# Patient Record
Sex: Female | Born: 1935 | Race: Black or African American | Hispanic: No | Marital: Single | Smoking: Never smoker
Health system: Southern US, Community
[De-identification: ages and names within clinical notes are randomized; demographics above are authoritative.]

## PROBLEM LIST (undated history)

## (undated) ENCOUNTER — Emergency Department (HOSPITAL_COMMUNITY): Admission: EM | Discharge status: Absent without leave | Payer: Medicare Other

## (undated) ENCOUNTER — Emergency Department (HOSPITAL_COMMUNITY): Admission: EM | Payer: Medicare Other | Source: Home / Self Care

## (undated) DIAGNOSIS — E119 Type 2 diabetes mellitus without complications: Secondary | ICD-10-CM

## (undated) DIAGNOSIS — Y92009 Unspecified place in unspecified non-institutional (private) residence as the place of occurrence of the external cause: Secondary | ICD-10-CM

## (undated) DIAGNOSIS — N390 Urinary tract infection, site not specified: Secondary | ICD-10-CM

## (undated) DIAGNOSIS — N182 Chronic kidney disease, stage 2 (mild): Secondary | ICD-10-CM

## (undated) DIAGNOSIS — I5042 Chronic combined systolic (congestive) and diastolic (congestive) heart failure: Secondary | ICD-10-CM

## (undated) DIAGNOSIS — E785 Hyperlipidemia, unspecified: Secondary | ICD-10-CM

## (undated) DIAGNOSIS — M1990 Unspecified osteoarthritis, unspecified site: Secondary | ICD-10-CM

## (undated) DIAGNOSIS — I340 Nonrheumatic mitral (valve) insufficiency: Secondary | ICD-10-CM

## (undated) DIAGNOSIS — N200 Calculus of kidney: Secondary | ICD-10-CM

## (undated) DIAGNOSIS — I10 Essential (primary) hypertension: Secondary | ICD-10-CM

## (undated) DIAGNOSIS — I499 Cardiac arrhythmia, unspecified: Secondary | ICD-10-CM

## (undated) DIAGNOSIS — IMO0001 Reserved for inherently not codable concepts without codable children: Secondary | ICD-10-CM

## (undated) DIAGNOSIS — Z9289 Personal history of other medical treatment: Secondary | ICD-10-CM

## (undated) DIAGNOSIS — K5790 Diverticulosis of intestine, part unspecified, without perforation or abscess without bleeding: Secondary | ICD-10-CM

## (undated) DIAGNOSIS — K5909 Other constipation: Secondary | ICD-10-CM

## (undated) DIAGNOSIS — I4891 Unspecified atrial fibrillation: Secondary | ICD-10-CM

## (undated) DIAGNOSIS — K649 Unspecified hemorrhoids: Secondary | ICD-10-CM

## (undated) DIAGNOSIS — K922 Gastrointestinal hemorrhage, unspecified: Secondary | ICD-10-CM

## (undated) DIAGNOSIS — K219 Gastro-esophageal reflux disease without esophagitis: Secondary | ICD-10-CM

## (undated) DIAGNOSIS — J189 Pneumonia, unspecified organism: Secondary | ICD-10-CM

## (undated) DIAGNOSIS — E669 Obesity, unspecified: Secondary | ICD-10-CM

## (undated) DIAGNOSIS — Z8719 Personal history of other diseases of the digestive system: Secondary | ICD-10-CM

## (undated) DIAGNOSIS — D509 Iron deficiency anemia, unspecified: Secondary | ICD-10-CM

## (undated) DIAGNOSIS — N8110 Cystocele, unspecified: Secondary | ICD-10-CM

## (undated) DIAGNOSIS — W19XXXA Unspecified fall, initial encounter: Secondary | ICD-10-CM

## (undated) DIAGNOSIS — S82899A Other fracture of unspecified lower leg, initial encounter for closed fracture: Secondary | ICD-10-CM

## (undated) HISTORY — DX: Unspecified atrial fibrillation: I48.91

## (undated) HISTORY — DX: Type 2 diabetes mellitus without complications: E11.9

## (undated) HISTORY — DX: Unspecified place in unspecified non-institutional (private) residence as the place of occurrence of the external cause: Y92.009

## (undated) HISTORY — DX: Essential (primary) hypertension: I10

## (undated) HISTORY — DX: Unspecified hemorrhoids: K64.9

## (undated) HISTORY — DX: Diverticulosis of intestine, part unspecified, without perforation or abscess without bleeding: K57.90

## (undated) HISTORY — PX: OTHER SURGICAL HISTORY: SHX169

## (undated) HISTORY — PX: FRACTURE SURGERY: SHX138

## (undated) HISTORY — DX: Unspecified osteoarthritis, unspecified site: M19.90

## (undated) HISTORY — DX: Other fracture of unspecified lower leg, initial encounter for closed fracture: S82.899A

## (undated) HISTORY — PX: VAGINAL HYSTERECTOMY: SUR661

## (undated) HISTORY — DX: Unspecified fall, initial encounter: W19.XXXA

## (undated) HISTORY — DX: Hyperlipidemia, unspecified: E78.5

---

## 1998-10-10 HISTORY — PX: WRIST FRACTURE SURGERY: SHX121

## 1999-06-22 ENCOUNTER — Encounter: Payer: Self-pay | Admitting: Emergency Medicine

## 1999-06-22 ENCOUNTER — Emergency Department (HOSPITAL_COMMUNITY): Admission: EM | Admit: 1999-06-22 | Discharge: 1999-06-22 | Payer: Self-pay | Admitting: Emergency Medicine

## 1999-06-26 ENCOUNTER — Emergency Department (HOSPITAL_COMMUNITY): Admission: EM | Admit: 1999-06-26 | Discharge: 1999-06-26 | Payer: Self-pay | Admitting: Emergency Medicine

## 1999-07-23 ENCOUNTER — Emergency Department (HOSPITAL_COMMUNITY): Admission: EM | Admit: 1999-07-23 | Discharge: 1999-07-23 | Payer: Self-pay | Admitting: Emergency Medicine

## 1999-07-23 ENCOUNTER — Encounter: Payer: Self-pay | Admitting: Emergency Medicine

## 2001-06-23 ENCOUNTER — Encounter: Admission: RE | Admit: 2001-06-23 | Discharge: 2001-06-23 | Payer: Self-pay | Admitting: Family Medicine

## 2001-06-23 ENCOUNTER — Encounter: Payer: Self-pay | Admitting: Family Medicine

## 2002-06-26 ENCOUNTER — Encounter: Payer: Self-pay | Admitting: Family Medicine

## 2002-06-26 ENCOUNTER — Encounter: Admission: RE | Admit: 2002-06-26 | Discharge: 2002-06-26 | Payer: Self-pay | Admitting: Family Medicine

## 2004-01-12 ENCOUNTER — Emergency Department (HOSPITAL_COMMUNITY): Admission: EM | Admit: 2004-01-12 | Discharge: 2004-01-12 | Payer: Self-pay | Admitting: Emergency Medicine

## 2004-05-20 ENCOUNTER — Emergency Department (HOSPITAL_COMMUNITY): Admission: EM | Admit: 2004-05-20 | Discharge: 2004-05-20 | Payer: Self-pay | Admitting: Emergency Medicine

## 2004-05-25 ENCOUNTER — Emergency Department (HOSPITAL_COMMUNITY): Admission: EM | Admit: 2004-05-25 | Discharge: 2004-05-26 | Payer: Self-pay | Admitting: Emergency Medicine

## 2004-11-27 ENCOUNTER — Emergency Department (HOSPITAL_COMMUNITY): Admission: EM | Admit: 2004-11-27 | Discharge: 2004-11-28 | Payer: Self-pay | Admitting: Emergency Medicine

## 2004-12-21 ENCOUNTER — Emergency Department (HOSPITAL_COMMUNITY): Admission: EM | Admit: 2004-12-21 | Discharge: 2004-12-21 | Payer: Self-pay | Admitting: Emergency Medicine

## 2006-02-23 ENCOUNTER — Emergency Department (HOSPITAL_COMMUNITY): Admission: EM | Admit: 2006-02-23 | Discharge: 2006-02-23 | Payer: Self-pay | Admitting: Family Medicine

## 2006-04-26 ENCOUNTER — Other Ambulatory Visit: Admission: RE | Admit: 2006-04-26 | Discharge: 2006-04-26 | Payer: Self-pay | Admitting: Obstetrics and Gynecology

## 2006-10-14 ENCOUNTER — Encounter: Admission: RE | Admit: 2006-10-14 | Discharge: 2006-10-14 | Payer: Self-pay | Admitting: Family Medicine

## 2006-12-22 ENCOUNTER — Encounter (INDEPENDENT_AMBULATORY_CARE_PROVIDER_SITE_OTHER): Payer: Self-pay | Admitting: Obstetrics and Gynecology

## 2006-12-22 ENCOUNTER — Inpatient Hospital Stay (HOSPITAL_COMMUNITY): Admission: RE | Admit: 2006-12-22 | Discharge: 2006-12-24 | Payer: Self-pay | Admitting: Obstetrics and Gynecology

## 2006-12-23 ENCOUNTER — Ambulatory Visit: Payer: Self-pay | Admitting: Vascular Surgery

## 2006-12-23 ENCOUNTER — Encounter (INDEPENDENT_AMBULATORY_CARE_PROVIDER_SITE_OTHER): Payer: Self-pay | Admitting: Obstetrics and Gynecology

## 2007-07-04 ENCOUNTER — Emergency Department (HOSPITAL_COMMUNITY): Admission: EM | Admit: 2007-07-04 | Discharge: 2007-07-04 | Payer: Self-pay | Admitting: Family Medicine

## 2007-12-25 ENCOUNTER — Encounter: Admission: RE | Admit: 2007-12-25 | Discharge: 2007-12-25 | Payer: Self-pay | Admitting: Family Medicine

## 2008-01-22 ENCOUNTER — Emergency Department (HOSPITAL_COMMUNITY): Admission: EM | Admit: 2008-01-22 | Discharge: 2008-01-22 | Payer: Self-pay | Admitting: Emergency Medicine

## 2008-09-12 ENCOUNTER — Emergency Department (HOSPITAL_COMMUNITY): Admission: EM | Admit: 2008-09-12 | Discharge: 2008-09-12 | Payer: Self-pay | Admitting: Emergency Medicine

## 2008-10-14 ENCOUNTER — Emergency Department (HOSPITAL_COMMUNITY): Admission: EM | Admit: 2008-10-14 | Discharge: 2008-10-15 | Payer: Self-pay | Admitting: Emergency Medicine

## 2009-01-20 ENCOUNTER — Inpatient Hospital Stay (HOSPITAL_COMMUNITY): Admission: EM | Admit: 2009-01-20 | Discharge: 2009-01-27 | Payer: Self-pay | Admitting: Emergency Medicine

## 2009-01-21 ENCOUNTER — Encounter (INDEPENDENT_AMBULATORY_CARE_PROVIDER_SITE_OTHER): Payer: Self-pay | Admitting: Internal Medicine

## 2009-01-23 ENCOUNTER — Encounter (INDEPENDENT_AMBULATORY_CARE_PROVIDER_SITE_OTHER): Payer: Self-pay | Admitting: Internal Medicine

## 2009-01-23 ENCOUNTER — Ambulatory Visit: Payer: Self-pay | Admitting: Vascular Surgery

## 2009-02-20 DIAGNOSIS — K5909 Other constipation: Secondary | ICD-10-CM | POA: Insufficient documentation

## 2009-02-20 DIAGNOSIS — I110 Hypertensive heart disease with heart failure: Secondary | ICD-10-CM

## 2009-02-20 DIAGNOSIS — Z8719 Personal history of other diseases of the digestive system: Secondary | ICD-10-CM

## 2009-03-04 ENCOUNTER — Ambulatory Visit: Payer: Self-pay | Admitting: Cardiology

## 2009-03-04 ENCOUNTER — Ambulatory Visit: Payer: Self-pay | Admitting: Internal Medicine

## 2009-03-04 ENCOUNTER — Inpatient Hospital Stay (HOSPITAL_COMMUNITY): Admission: EM | Admit: 2009-03-04 | Discharge: 2009-03-19 | Payer: Self-pay | Admitting: Emergency Medicine

## 2009-03-04 DIAGNOSIS — R609 Edema, unspecified: Secondary | ICD-10-CM

## 2009-03-04 DIAGNOSIS — I428 Other cardiomyopathies: Secondary | ICD-10-CM | POA: Insufficient documentation

## 2009-03-05 ENCOUNTER — Ambulatory Visit: Payer: Self-pay | Admitting: Cardiovascular Disease

## 2009-03-05 ENCOUNTER — Encounter: Payer: Self-pay | Admitting: Cardiology

## 2009-03-09 ENCOUNTER — Encounter: Payer: Self-pay | Admitting: Internal Medicine

## 2009-03-25 ENCOUNTER — Telehealth: Payer: Self-pay | Admitting: Cardiology

## 2009-04-15 ENCOUNTER — Ambulatory Visit: Payer: Self-pay | Admitting: Cardiology

## 2009-04-16 ENCOUNTER — Encounter: Payer: Self-pay | Admitting: Cardiology

## 2009-04-18 ENCOUNTER — Encounter: Payer: Self-pay | Admitting: Cardiology

## 2009-05-02 ENCOUNTER — Encounter: Payer: Self-pay | Admitting: Cardiology

## 2009-05-20 ENCOUNTER — Encounter: Payer: Self-pay | Admitting: Cardiology

## 2009-06-06 ENCOUNTER — Encounter: Payer: Self-pay | Admitting: Cardiology

## 2009-07-08 ENCOUNTER — Encounter: Payer: Self-pay | Admitting: Cardiology

## 2009-07-24 ENCOUNTER — Encounter: Payer: Self-pay | Admitting: Cardiology

## 2009-08-05 ENCOUNTER — Telehealth: Payer: Self-pay | Admitting: Cardiology

## 2009-08-15 ENCOUNTER — Telehealth: Payer: Self-pay | Admitting: Cardiology

## 2009-08-23 ENCOUNTER — Ambulatory Visit: Payer: Self-pay | Admitting: Internal Medicine

## 2009-08-23 ENCOUNTER — Inpatient Hospital Stay (HOSPITAL_COMMUNITY): Admission: EM | Admit: 2009-08-23 | Discharge: 2009-08-27 | Payer: Self-pay | Admitting: Emergency Medicine

## 2009-08-25 ENCOUNTER — Encounter (INDEPENDENT_AMBULATORY_CARE_PROVIDER_SITE_OTHER): Payer: Self-pay | Admitting: *Deleted

## 2009-08-27 ENCOUNTER — Encounter (INDEPENDENT_AMBULATORY_CARE_PROVIDER_SITE_OTHER): Payer: Self-pay | Admitting: *Deleted

## 2009-08-29 ENCOUNTER — Ambulatory Visit: Payer: Self-pay | Admitting: Cardiology

## 2009-09-01 LAB — CONVERTED CEMR LAB
Calcium: 9.7 mg/dL (ref 8.4–10.5)
Chloride: 96 meq/L (ref 96–112)
Creatinine, Ser: 1.6 mg/dL — ABNORMAL HIGH (ref 0.4–1.2)

## 2009-09-09 ENCOUNTER — Ambulatory Visit: Payer: Self-pay | Admitting: Cardiology

## 2009-09-09 ENCOUNTER — Encounter: Payer: Self-pay | Admitting: Physician Assistant

## 2009-09-09 DIAGNOSIS — I5042 Chronic combined systolic (congestive) and diastolic (congestive) heart failure: Secondary | ICD-10-CM

## 2009-09-09 LAB — CONVERTED CEMR LAB: Potassium: 3.6 meq/L (ref 3.5–5.1)

## 2009-09-17 ENCOUNTER — Ambulatory Visit: Payer: Self-pay | Admitting: Cardiology

## 2009-09-18 LAB — CONVERTED CEMR LAB
BUN: 29 mg/dL — ABNORMAL HIGH (ref 6–23)
Chloride: 88 meq/L — ABNORMAL LOW (ref 96–112)
GFR calc non Af Amer: 63.26 mL/min (ref >60)
Glucose, Bld: 114 mg/dL — ABNORMAL HIGH (ref 70–99)
Potassium: 3.1 meq/L — ABNORMAL LOW (ref 3.5–5.1)
Sodium: 129 meq/L — ABNORMAL LOW (ref 135–145)

## 2009-09-24 ENCOUNTER — Ambulatory Visit: Payer: Self-pay | Admitting: Cardiology

## 2009-09-24 DIAGNOSIS — E876 Hypokalemia: Secondary | ICD-10-CM | POA: Insufficient documentation

## 2009-09-25 LAB — CONVERTED CEMR LAB
BUN: 31 mg/dL — ABNORMAL HIGH (ref 6–23)
Calcium: 9.4 mg/dL (ref 8.4–10.5)
Chloride: 92 meq/L — ABNORMAL LOW (ref 96–112)
Creatinine, Ser: 1.1 mg/dL (ref 0.4–1.2)
GFR calc non Af Amer: 61.95 mL/min (ref >60)
Glucose, Bld: 70 mg/dL (ref 70–99)

## 2009-10-03 ENCOUNTER — Encounter: Payer: Self-pay | Admitting: Cardiology

## 2009-10-09 ENCOUNTER — Encounter: Payer: Self-pay | Admitting: Cardiology

## 2009-10-20 ENCOUNTER — Ambulatory Visit: Payer: Self-pay | Admitting: Gastroenterology

## 2009-10-24 ENCOUNTER — Ambulatory Visit: Payer: Self-pay | Admitting: Cardiology

## 2009-10-30 ENCOUNTER — Telehealth: Payer: Self-pay | Admitting: Cardiology

## 2009-10-30 LAB — CONVERTED CEMR LAB
BUN: 23 mg/dL (ref 6–23)
Chloride: 90 meq/L — ABNORMAL LOW (ref 96–112)
Creatinine, Ser: 1.2 mg/dL (ref 0.4–1.2)
Glucose, Bld: 66 mg/dL — ABNORMAL LOW (ref 70–99)
Potassium: 4.8 meq/L (ref 3.5–5.1)
Sodium: 126 meq/L — ABNORMAL LOW (ref 135–145)

## 2009-10-31 ENCOUNTER — Ambulatory Visit: Payer: Self-pay | Admitting: Cardiology

## 2009-12-10 ENCOUNTER — Encounter: Payer: Self-pay | Admitting: Cardiology

## 2010-02-10 LAB — CONVERTED CEMR LAB
BUN: 29 mg/dL — ABNORMAL HIGH (ref 6–23)
Calcium: 9.3 mg/dL (ref 8.4–10.5)
Creatinine, Ser: 1.2 mg/dL (ref 0.4–1.2)
GFR calc non Af Amer: 57.71 mL/min (ref >60)
Potassium: 3.5 meq/L (ref 3.5–5.1)

## 2010-02-16 ENCOUNTER — Encounter: Payer: Self-pay | Admitting: Cardiology

## 2010-03-08 LAB — CONVERTED CEMR LAB
BUN: 21 mg/dL (ref 6–23)
Calcium: 9.1 mg/dL (ref 8.4–10.5)
Chloride: 103 meq/L (ref 96–112)
Glucose, Bld: 82 mg/dL (ref 70–99)
Potassium: 4.4 meq/L (ref 3.5–5.1)
Sodium: 137 meq/L (ref 135–145)

## 2010-03-10 NOTE — Assessment & Plan Note (Signed)
Summary: eph. appt is 11:30/ gd   Visit Type:  post-hospital Primary Provider:  Dr. Margretta Ditty, MD  CC:  Sob.  History of Present Illness: This is a 76 year old African American female patient who has chronic systolic and diastolic heart failure and was admitted to the hospital with a recent exacerbation. She also had abdominal discomfort felt secondary to chronic constipation. The patient did have some renal insufficiency with a creatinine of 1.5 on day of discharge.  The patient says her breathing is better. Her main complaint is having a prolapsed bladder. She does not urinate during the day much but when she lays down at nigh,t her bladder just leaks all night long, profusely. She has had multiple UTIs and has a follow up appointment with her urologist on Thursday. She thinks she is on too much Lasix because of how much urine she leaks at night.  Current Medications (verified): 1)  Coreg 3.125 Mg Tabs (Carvedilol) .Marland Kitchen.. 1 Tab Two Times A Day 2)  Lisinopril 10 Mg Tabs (Lisinopril) .... Once Daily 3)  Polyethylene Glycol 1450  Liqd (Polyethylene Glycol 1450) .Marland Kitchen.. 17gms Every Two Days 4)  Aspirin 81 Mg Tbec (Aspirin) .... Take One Tablet By Mouth Daily 5)  Ferretts 325 (106 Fe) Mg Tabs (Ferrous Fumarate) .... One Two Times A Day 6)  Loratadine-Pseudoephedrine 10-240 Mg Xr24h-Tab (Loratadine-Pseudoephedrine) .... Onr Tab  As Needed  For Runny Nose 7)  Furosemide 80 Mg Tabs (Furosemide) .Marland Kitchen.. 1 By Mouth Two Times A Day 8)  Klor-Con 10 10 Meq Cr-Tabs (Potassium Chloride) .Marland Kitchen.. 1 By Mouth Three Times A Day 9)  Zaroxolyn 2.5 Mg Tabs (Metolazone) .... 30 Minutes Prior To Lasix 2 Times Weekly 10)  Premarin 0.625 Mg/gm Crea (Estrogens, Conjugated) .... Weekly 11)  Bisacodyl Ec 5 Mg Tbec (Bisacodyl) .... Take 1 Tablet By Mouth Once A Day 12)  Tramadol Hcl 50 Mg Tabs (Tramadol Hcl) .... As Needed  Allergies: 1)  ! Pcn 2)  ! Diphtheria-Tetanus Toxoids (Diphtheria-Tetanus Toxoids Dt) 3)  ! *  Latex  Past History:  Past Medical History: Last updated: 03/04/2009  1. Hypertension. x years  2. She has had multiple prior visits to the emergency room with       various complaints, many musculoskeletal and a few abdominal       complaints.    3. History of diverticulosis.   4. Hemorrhoids.   5. Chronic constipation.   6. Osteoarthritis.   7. Cardiomyopathy (EF 40 - 45%)  Review of Systems       see history of present illness. She is on home oxygen.  Vital Signs:  Patient profile:   76 year old female Height:      61 inches Weight:      199 pounds BMI:     37.74 Pulse rate:   82 / minute Pulse rhythm:   irregular Resp:     20 per minute BP sitting:   90 / 60  (left arm) Cuff size:   large  Vitals Entered By: Vikki Ports (September 09, 2009 11:43 AM)  Physical Exam  General:   Well-nournished, in no acute distress. Neck: No JVD, HJR, Bruit, or thyroid enlargement Lungs: No tachypnea, clear without wheezing, rales, or rhonchi Cardiovascular: RRR, PMI not displaced, heart sounds normal, no murmurs, gallops, bruit, thrill, or heave. Abdomen: BS normal. Soft without organomegaly, masses, lesions or tenderness. Extremities: trace of edema bilaterally in the lower extremities, without cyanosis, clubbing .Good distal pulses bilateral SKin: Warm, no  lesions or rashes  Musculoskeletal: No deformities Neuro: no focal signs    EKG  Procedure date:  09/09/2009  Findings:      normal sinus rhythm with PVCs nonspecific ST-T wave changes  Impression & Recommendations:  Problem # 1:  COMBINED HEART FAILURE, CHRONIC (ICD-428.42) Patient had a recent admission with acute on chronic systolic and diastolic heart failure. She did diurese in the hospital. She had some renal insufficiency with creatinine up to 1.5 and a of discharge. Her main complaints her related to her bladder prolapse and leaking of urine when she lays down. We will check a creatinine today. If it's elevated  we will decrease her diuretics. She is well compensated today but I am afraid to back off on her diuretics at this point unless her creatinine is elevated. Her updated medication list for this problem includes:    Coreg 3.125 Mg Tabs (Carvedilol) .Marland Kitchen... 1 tab two times a day    Lisinopril 10 Mg Tabs (Lisinopril) ..... Once daily    Aspirin 81 Mg Tbec (Aspirin) .Marland Kitchen... Take one tablet by mouth daily    Furosemide 80 Mg Tabs (Furosemide) .Marland Kitchen... 1 by mouth two times a day    Zaroxolyn 2.5 Mg Tabs (Metolazone) .Marland KitchenMarland KitchenMarland KitchenMarland Kitchen 30 minutes prior to lasix 2 times weekly  Problem # 2:  HYPERTENSION (ICD-401.9) The pressure is stable Her updated medication list for this problem includes:    Coreg 3.125 Mg Tabs (Carvedilol) .Marland Kitchen... 1 tab two times a day    Lisinopril 10 Mg Tabs (Lisinopril) ..... Once daily    Aspirin 81 Mg Tbec (Aspirin) .Marland Kitchen... Take one tablet by mouth daily    Furosemide 80 Mg Tabs (Furosemide) .Marland Kitchen... 1 by mouth two times a day    Zaroxolyn 2.5 Mg Tabs (Metolazone) .Marland KitchenMarland KitchenMarland KitchenMarland Kitchen 30 minutes prior to lasix 2 times weekly  Orders: TLB-BMP (Basic Metabolic Panel-BMET) (80048-METABOL)  Other Orders: EKG w/ Interpretation (93000)  Patient Instructions: 1)  Your physician recommends that you schedule a follow-up appointment in: 1 month with Dr. Antoine Poche 2)  Your physician recommends that you return for lab work in: BMET today.

## 2010-03-10 NOTE — Progress Notes (Signed)
Summary: pt wants to discuss if meds are helping her  Phone Note Call from Patient Call back at Home Phone [phone]   Caller: Patient Reason for Call: Talk to Nurse, Talk to Doctor Summary of Call: pt wanted to talk to someone regarding if her meds are helping her or not Initial call taken by: Omer Jack,  August 05, 2009 10:50 AM  Follow-up for Phone Call        unable to leave message at listed number . PER MESSAGE ON PHONE  PT UNAVAILABLE AND  UNABLE TO LEAVE MESSAGE Scherrie Bateman, LPN  August 05, 2009 2:46 PM ATTEMPTED TO CALL PT AGAIN  CONT TO GET SAME RECORDING AND UNABLE TO LEAVE MESSAGE.  Follow-up by: Scherrie Bateman, LPN,  August 05, 2009 4:50 PM  Additional Follow-up for Phone Call Additional follow up Details #1::        dtr cheryl returned call-she can be reached at [phone] Additional Follow-up by: Glynda Jaeger,  August 08, 2009 9:08 AM    Additional Follow-up for Phone Call Additional follow up Details #2::    Spoke with daughter and answered her questions. Marrion Coy, CNA  August 08, 2009 3:05 PM  Follow-up by: Marrion Coy, CNA,  August 08, 2009 3:05 PM

## 2010-03-10 NOTE — Letter (Signed)
Summary: Revision to Plan of Care  Revision to Plan of Care   Imported By: Debby Freiberg 08/12/2009 16:26:49  _____________________________________________________________________  External Attachment:    Type:   Image     Comment:   External Document

## 2010-03-10 NOTE — Assessment & Plan Note (Signed)
Summary: [name]   Visit Type:  Follow-up Primary Provider:  Dr. Margretta Ditty, MD  CC:  CHF.  History of Present Illness: The patient presents for followup of mixed systolic and diastolic heart failure. She was hospitalized after I saw her for the first time in the clinic recently. She had massive volume overload related to an EF of 40-45% with diastolic dysfunction. She was in the hospital for 2 weeks getting diuresis. She went from 301 pounds to 246. In the nursing home she initially had some recurrent increased lower extremity swelling has her feet were on the floor quite a bit. Zaroxolyn twice weekly was added to her regimen. She now is down to 228 pounds. She thinks she breathes better. She still ambulates slowly with the help of the walker. She spends quite a bit of time in a wheelchair in part secondary to morbid obesity. She has been complaining of chronic "bone" pain diffusely in her legs. She is on pain management for this. She has not been describing new shortness of breath, PND or orthopnea. She has not been having no palpitations, presyncope or syncope.  Current Medications (verified): 1)  Coreg 3.125 Mg Tabs (Carvedilol) .Marland Kitchen.. 1 Tab Two Times A Day 2)  Lisinopril 10 Mg Tabs (Lisinopril) .... Once Daily 3)  Polyethylene Glycol 1450  Liqd (Polyethylene Glycol 1450) .Marland Kitchen.. 17gms Every Two Days 4)  Aspirin 81 Mg Tbec (Aspirin) .... Take One Tablet By Mouth Daily 5)  Ferretts 325 (106 Fe) Mg Tabs (Ferrous Fumarate) .... One Two Times A Day 6)  Loratadine-Pseudoephedrine 10-240 Mg Xr24h-Tab (Loratadine-Pseudoephedrine) .... Onr Tab  As Needed  For Runny Nose 7)  Furosemide 40 Mg Tabs (Furosemide) .Marland Kitchen.. 1 By Mouth Daily 8)  Hydrocodone-Acetaminophen 5-325 Mg Tabs (Hydrocodone-Acetaminophen) .... Take One Tab 3 Tinesdaily For 14 Daysfor Pain 9)  Klor-Con 10 10 Meq Cr-Tabs (Potassium Chloride) .Marland Kitchen.. 1 By Mouth Three Times A Day 10)  Zaroxolyn 2.5 Mg Tabs (Metolazone) .... 30 Minutes Prior To  Lasix 2 Times Weekly  Allergies (verified): 1)  ! Pcn 2)  ! Diphtheria-Tetanus Toxoids (Diphtheria-Tetanus Toxoids Dt)  Past History:  Past Medical History: Reviewed history from 03/04/2009 and no changes required.  1. Hypertension. x years  2. She has had multiple prior visits to the emergency room with       various complaints, many musculoskeletal and a few abdominal       complaints.    3. History of diverticulosis.   4. Hemorrhoids.   5. Chronic constipation.   6. Osteoarthritis.   7. Cardiomyopathy (EF 40 - 45%)  Past Surgical History: Reviewed history from 02/20/2009 and no changes required.  Transvaginal hysterectomy 12/21/2008  Review of Systems       As stated in the HPI and negative for all other systems.   Vital Signs:  Patient profile:   76 year old female Height:      61 inches Weight:      228 pounds BMI:     43.24 O2 Sat:      94 % on Room air Pulse rate:   79 / minute Resp:     16 per minute BP sitting:   127 / 72  (right arm)  Vitals Entered By: Marrion Coy, CNA (April 15, 2009 4:09 PM)  O2 Flow:  Room air  Physical Exam  General:  Well developed, well nourished, in no acute distress. Head:  normocephalic and atraumatic Neck:  Neck supple, no JVD. No masses, thyromegaly or abnormal  cervical nodes. Lungs:  Clear bilaterally to auscultation and percussion. Abdomen:  positive bowel sounds without rebound or guarding, would be unable to appreciate hepatomegaly or splenomegaly or midline post mass secondary to size Extremities:  diffuse dependent edema though much improved compared with previous Neurologic:  Alert and oriented x 3. Psych:  Normal affect.   Detailed Cardiovascular Exam  Neck    Carotids: Carotids full and equal bilaterally without bruits.      Neck Veins: jugular venous distention to the angle of the jaw at 90  Heart    Inspection: no deformities or lifts noted.      Palpation: normal PMI with no thrills palpable.       Auscultation: S1 and S2 within normal limits, no S3, no S4, no clicks, no rubs, no murmurs.  Vascular    Abdominal Aorta: unable to appreciate secondary to size and the patient is seated well examined    Femoral Pulses: unable to palpate femorals    Pedal Pulses: unable to appreciate pedal pulses    Radial Pulses: normal radial pulses bilaterally.      Peripheral Circulation:     EKG  Procedure date:  04/15/2009  Findings:      sinus rhythm, rate 79, axis rightward, intervals within normal limits, poor anterior R-wave progression  Impression & Recommendations:  Problem # 1:  ANASARCA (ICD-782.3) This is much improved.  I will check a basic metabolic profile today. She's about to leave the nursing home. We talked about keeping her feet elevated in salt and fluid restriction. Hopefully they will comply.  Problem # 2:  HYPERTENSION (ICD-401.9) Her blood pressure is controlled and she will continue the meds as listed. Orders: TLB-BMP (Basic Metabolic Panel-BMET) (80048-METABOL) EKG w/ Interpretation (93000)  Problem # 3:  CARDIOMYOPATHY (ICD-425.4) She he is on a reasonable regimen for now. I will slowly titrate these as her pressure allows over time. Orders: TLB-BMP (Basic Metabolic Panel-BMET) (80048-METABOL) EKG w/ Interpretation (93000)  Patient Instructions: 1)  Your physician recommends that you schedule a follow-up appointment in: 4 months with Dr Antoine Poche 2)  Your physician recommends that you have  lab work today 3)  Your physician recommends that you continue on your current medications as directed. Please refer to the Current Medication list given to you today. Prescriptions: ZAROXOLYN 2.5 MG TABS (METOLAZONE) 30 minutes prior to lasix 2 times weekly  #30 x 6   Entered by:   Charolotte Capuchin, RN   Authorized by:   Rollene Rotunda, MD, [facility]   Signed by:   Charolotte Capuchin, RN on 04/15/2009   Method used:   Electronically to        [facility] (retail)       [address]       [place], [place]  [id number]       Ph: [phone]       Fax: [phone]   RxID:   [id number] KLOR-CON 10 10 MEQ CR-TABS (POTASSIUM CHLORIDE) 1 by mouth three times a day  #90 x 11   Entered by:   Charolotte Capuchin, RN   Authorized by:   Rollene Rotunda, MD, [facility]   Signed by:   Charolotte Capuchin, RN on 04/15/2009   Method used:   Electronically to        [facility] (retail)       [address]       [place], [place]  [id number]  Ph: [phone]       Fax: [phone]   RxID:   [id number] FUROSEMIDE 40 MG TABS (FUROSEMIDE) 1 by mouth daily  #30 x 11   Entered by:   Charolotte Capuchin, RN   Authorized by:   Rollene Rotunda, MD, [facility]   Signed by:   Charolotte Capuchin, RN on 04/15/2009   Method used:   Electronically to        [facility] (retail)       [address]       [place], [place]  [address]       Ph: [phone]       Fax: [phone]   RxID:   [id number] LISINOPRIL 10 MG TABS (LISINOPRIL) once daily  #30 x 11   Entered by:   Charolotte Capuchin, RN   Authorized by:   Rollene Rotunda, MD, [facility]   Signed by:   Charolotte Capuchin, RN on 04/15/2009   Method used:   Electronically to        [facility] (retail)       [address]       [place], [place]  [id number]       Ph: [phone]       Fax: [phone]   RxID:   [id number] COREG 3.125 MG TABS (CARVEDILOL) 1 tab two times a day  #60 x 11   Entered by:   Charolotte Capuchin, RN   Authorized by:   Rollene Rotunda, MD, [facility]   Signed by:   Charolotte Capuchin, RN on 04/15/2009   Method used:   Electronically to        [facility] (retail)       [address]       [place], [place]  [address]       Ph: [phone]       Fax: [phone]   RxID:   [id number]

## 2010-03-10 NOTE — Miscellaneous (Signed)
Summary: Orders Update  Clinical Lists Changes  Orders: Added new Test order of TLB-BMP (Basic Metabolic Panel-BMET) (80048-METABOL) - Signed 

## 2010-03-10 NOTE — Progress Notes (Signed)
Summary: refill request  Phone Note Refill Request Message from:  Patient on August 15, 2009 9:20 AM  pt calling for refill of metrolazone 2.5 mg, furosemide 40mg  has 80mg  also she takes both, cardevol 325mg   [facility]   Method Requested: Telephone to Pharmacy Initial call taken by: Glynda Jaeger,  August 15, 2009 9:22 AM  Follow-up for Phone Call        Spoke with pts daughter, rx for coreg sent into pharmacy. Other RX should have refills left on them. Advised her to call pharmacy to get rx filled. Marrion Coy, CNA  August 15, 2009 1:46 PM  Follow-up by: Marrion Coy, CNA,  August 15, 2009 1:46 PM    Prescriptions: COREG 3.125 MG TABS (CARVEDILOL) 1 tab two times a day  #60 x 6   Entered by:   Marrion Coy, CNA   Authorized by:   Rollene Rotunda, MD, [facility]   Signed by:   Marrion Coy, CNA on 08/15/2009   Method used:   Electronically to        [facility] (retail)       [address]       [place], [place]  [id number]       Ph: [phone]       Fax: [phone]   RxID:   [id number]

## 2010-03-10 NOTE — Assessment & Plan Note (Signed)
Summary: ec6   Visit Type:  Initial Consult Primary Provider:  Dr. Julio Sicks  CC:  CHF.  History of Present Illness: The patient presents for an initial evaluation. I see that she was hospitalized in December at [facility] with heart failure. This apparently was a new diagnosis for her. Her EF was 40-45%. She was managed with diuresis though she said she still had some edema fluid when she left to go to rehabilitation. She went to rehabilitation because she was significantly weak. Patient now presents as a new patient. She says she sits most of the time with her feet on the ground sitting on the edge of her bed. She said she has had progressive swelling up to her back. She does ambulate minimally to the bathroom. She says her legs ache because of the swelling. She does have dyspnea with minimal exertion though she is not describing new PND or orthopnea. She does sleep with her head somewhat elevated. She's not describing any chest pressure, neck or arm discomfort. She is not describing any palpitations, presyncope or syncope.  Problems Prior to Update: 1)  Constipation, Chronic  (ICD-564.09) 2)  Diverticulitis, Hx of  (ICD-V12.79) 3)  Hypertension  (ICD-401.9)  Current Medications (verified): 1)  Coreg 3.125 Mg Tabs (Carvedilol) .Marland Kitchen.. 1 Tab Two Times A Day 2)  Lisinopril 10 Mg Tabs (Lisinopril) .... Once Daily 3)  Mapap 500 Mg Caps (Acetaminophen) .... One Every 6 Hrs For Pain 4)  Altarussin Dm 100-10 Mg/82ml Syrp (Dextromethorphan-Guaifenesin) .... Every 6hrs For Cough 5)  Polyethylene Glycol 1450  Liqd (Polyethylene Glycol 1450) .Marland Kitchen.. 17gms Every Two Days 6)  Aspirin 81 Mg Tbec (Aspirin) .... Take One Tablet By Mouth Daily 7)  Ferretts 325 (106 Fe) Mg Tabs (Ferrous Fumarate) .... One Two Times A Day 8)  Alavert Allergy/sinus 5-120 Mg Xr12h-Tab (Loratadine-Pseudoephedrine) .... Take Onr Tab Two Times A Day As Needed For Runny Nose 9)  Loratadine-Pseudoephedrine 10-240 Mg Xr24h-Tab  (Loratadine-Pseudoephedrine) .... Onr Tab  As Needed  For Runny Nose 10)  Furosemide 40 Mg Tabs (Furosemide) .... One Tab Two Times A Day 11)  Hydrocodone-Acetaminophen 5-325 Mg Tabs (Hydrocodone-Acetaminophen) .... Take One Tab 3 Tinesdaily For 14 Daysfor Pain  Allergies (verified): 1)  ! Pcn 2)  ! Diphtheria-Tetanus Toxoids (Diphtheria-Tetanus Toxoids Dt)  Past History:  Past Medical History:  1. Hypertension. x years  2. She has had multiple prior visits to the emergency room with       various complaints, many musculoskeletal and a few abdominal       complaints.    3. History of diverticulosis.   4. Hemorrhoids.   5. Chronic constipation.   6. Osteoarthritis.   7. Cardiomyopathy (EF 40 - 45%)  Family History:  Her father died unknown age, unknown cause. Mother died   unknown age, unknown cause. She has several siblings with a family  propensity for diabetes and hypertension. She had 1 brother with leukemia. One brother status post CABG (age 21).      Social History: The patient lives alone. She is a retired Data processing manager. She still uses snuff tobacco. No prior smoking history. No alcohol or illicit drugs.      Review of Systems       Chronic constipation, joint pains, slow and steady weight gain. Otherwise as stated in the history of present illness negative for all other systems.  Vital Signs:  Patient profile:   76 year old female Height:      61 inches  Weight:      301 pounds BMI:     57.08 Pulse rate:   81 / minute BP sitting:   121 / 78  (right arm)  Vitals Entered By: Oswald Hillock (March 04, 2009 3:13 PM)  Physical Exam  General:  Well developed, well nourished, in no acute distress. Head:  normocephalic and atraumatic Eyes:  PERRLA/EOM intact; conjunctiva and lids normal. Mouth:  Teeth, gums and palate normal. Oral mucosa normal. Neck:  Neck supple, no JVD. No masses, thyromegaly or abnormal cervical nodes. Chest Wall:  no deformities or  breast masses noted Lungs:  bilateral basilar crackles Abdomen:  positive bowel sounds without rebound or guarding, would be unable to appreciate hepatomegaly or splenomegaly or midline post mass secondary to size Extremities:  severe anasarca with diffuse swelling to the mid back Neurologic:  Alert and oriented x 3. Skin:  Intact without lesions or rashes. Cervical Nodes:  no significant adenopathy Axillary Nodes:  unable to appreciate Inguinal Nodes:  , unable to appreciate Psych:  Normal affect.   Detailed Cardiovascular Exam  Neck    Carotids: Carotids full and equal bilaterally without bruits.      Neck Veins: jugular venous distention to the angle of the jaw at 90  Heart    Inspection: no deformities or lifts noted.      Palpation: normal PMI with no thrills palpable.      Auscultation: S1 and S2 within normal limits, no S3, no S4, no clicks, no rubs, no murmurs.  Vascular    Abdominal Aorta: unable to appreciate secondary to size and the patient is seated well examined    Femoral Pulses: unable to palpate femorals    Pedal Pulses: unable to appreciate pedal pulses    Radial Pulses: normal radial pulses bilaterally.      Peripheral Circulation: no clubbing, cyanosis, or edema noted with normal capillary refill.     EKG  Procedure date:  03/04/2009  Findings:      sinus rhythm, rate 81, left axis deviation, left anterior fascicular block, poor anterior R-wave progression  Impression & Recommendations:  Problem # 1:  ANASARCA (ICD-782.3) The patient has severe diffuse edema. I could not correct this as an outpatient. She has acute on chronic systolic and diastolic heart failure obviously. This will need to be corrected in hospital. She will be admitted for IV diuresis. On need watch renal function very carefully as she's had some renal insufficiency apparently. We'll need to watch hyponatremia which was a problem. She may need ultrafiltration.  Problem # 2:   CARDIOMYOPATHY (ICD-425.4) For now I will continue the meds as listed.  Problem # 3:  HYPERTENSION (ICD-401.9) Her blood pressure will be controlled in the context of managing her cardiomyopathy.

## 2010-03-10 NOTE — Miscellaneous (Signed)
Summary: Home Health Certification/Care Plan   Home Health Certification/Care Plan   Imported By: Roderic Ovens 12/23/2009 11:45:56  _____________________________________________________________________  External Attachment:    Type:   Image     Comment:   External Document

## 2010-03-10 NOTE — Progress Notes (Signed)
Summary: rtn call to get lab results  Phone Note Call from Patient Call back at Home Phone [phone]   Caller: Daughter/[name] Reason for Call: Talk to Nurse, Talk to Doctor, Lab or Test Results Summary of Call: rtn call to get lab results Initial call taken by: Omer Jack,  October 30, 2009 9:31 AM  Follow-up for Phone Call        Spoke with [name] about the need for the fluid restriction and repeat blood work for tomorrow.  She states understanding of the fluid restriction and how to measure the fluid.  She will bring the pt in tomorrow for blood work. Follow-up by: Charolotte Capuchin, RN,  October 30, 2009 10:34 AM

## 2010-03-10 NOTE — Miscellaneous (Signed)
Summary: [facility] Revision to Plan of Care  [facility] Revision to Plan of Care   Imported By: Roderic Ovens 07/21/2009 12:38:18  _____________________________________________________________________  External Attachment:    Type:   Image     Comment:   External Document

## 2010-03-10 NOTE — Letter (Signed)
Summary: [facility] Progress Note   [facility] Progress Note   Imported By: Roderic Ovens 12/15/2009 13:45:58  _____________________________________________________________________  External Attachment:    Type:   Image     Comment:   External Document

## 2010-03-10 NOTE — Miscellaneous (Signed)
Summary: [facility] Care [facility] Revision  [facility] Care Plan Revision   Imported By: Roderic Ovens 06/26/2009 13:39:35  _____________________________________________________________________  External Attachment:    Type:   Image     Comment:   External Document

## 2010-03-10 NOTE — Assessment & Plan Note (Signed)
Summary: PER CHECK OUT/SF   Visit Type:  Follow-up Primary Provider:  Dr. Margretta Ditty, MD  CC:  CHF.  History of Present Illness: The patient presents for followup of acute on chronic systolic and diastolic heart failure. Since I last saw her she has been doing pretty well. She is watching her fluid and salt. She is not having any acute shortness of breath, PND or orthopnea. She is not having any chest pressure, neck or arm discomfort. She is not having any lower extremity swelling. Her family is doing a great job with volume management. Her biggest problem continues to be bladder prolapse and recurrent urinary infections.  Current Medications (verified): 1)  Coreg 3.125 Mg Tabs (Carvedilol) .Marland Kitchen.. 1 Tab Two Times A Day 2)  Lisinopril 10 Mg Tabs (Lisinopril) .... Once Daily 3)  Aspirin 81 Mg Tbec (Aspirin) .... Take One Tablet By Mouth Daily 4)  Ferretts 325 (106 Fe) Mg Tabs (Ferrous Fumarate) .... One Two Times A Day 5)  Loratadine-Pseudoephedrine 10-240 Mg Xr24h-Tab (Loratadine-Pseudoephedrine) .... Onr Tab  As Needed  For Runny Nose 6)  Furosemide 80 Mg Tabs (Furosemide) .Marland Kitchen.. 1 By Mouth Two Times A Day 7)  Klor-Con M20 20 Meq Cr-Tabs (Potassium Chloride Crys Cr) .... 2 By Mouth Two Times A Day 8)  Zaroxolyn 2.5 Mg Tabs (Metolazone) .... 30 Minutes Prior To Lasix 2 Times Weekly 9)  Premarin 0.625 Mg/gm Crea (Estrogens, Conjugated) .... Weekly 10)  Bisacodyl Ec 5 Mg Tbec (Bisacodyl) .... Take 1 Tablet By Mouth Once A Day 11)  Hydrocodone-Acetaminophen 5-325 Mg Tabs (Hydrocodone-Acetaminophen) .... As Needed 12)  Miralax  Powd (Polyethylene Glycol 3350) .... As Directed 13)  Sanctura Xr 60 Mg Xr24h-Cap (Trospium Chloride) .Marland Kitchen.. 1 Po Daily 14)  Toviaz 4 Mg Xr24h-Tab (Fesoterodine Fumarate) .Marland Kitchen.. 1 Po Daily  Allergies (verified): 1)  ! Pcn 2)  ! Diphtheria-Tetanus Toxoids (Diphtheria-Tetanus Toxoids Dt) 3)  ! * Latex  Past History:  Past Medical History:  1. Hypertension. x years  2.  She has had multiple prior visits to the emergency room with       various complaints, many musculoskeletal and a few abdominal       complaints.    3. History of diverticulosis.   4. Hemorrhoids.   5. Chronic constipation.   6. Osteoarthritis.   7. Cardiomyopathy (EF 40 - 45%)  8. Urinary Tract Infection  Past Surgical History:  Transvaginal hysterectomy 12/21/2008  Wrist surgery (fracture)  Review of Systems       As stated in the HPI and negative for all other systems.   Vital Signs:  Patient profile:   76 year old female Height:      61 inches Weight:      196 pounds BMI:     37.17 Pulse rate:   58 / minute Resp:     18 per minute BP sitting:   108 / 70  (left arm)  Vitals Entered By: Marrion Coy, CNA (October 24, 2009 10:54 AM)  Physical Exam  General:  Well developed, well nourished, in no acute distress. Head:  normocephalic and atraumatic Mouth:  She has a few rotten teeth. Oral mucosa normal. Neck:  No jugular venous tension 90 Lungs:  Clear bilaterally to auscultation and percussion. Heart:  S1 and S2 within normal limits, no S3, no S4, distant heart sounds Abdomen:  Positive bowel sounds, no rebound, no guarding, unable to appreciate organomegaly the patient seated Msk:  Mild diffuse muscle wasting Extremities:  No clubbing  or cyanosis. Neurologic:  Alert and oriented x 3. Skin:  Intact without lesions or rashes. Psych:  Normal affect.   Impression & Recommendations:  Problem # 1:  COMBINED HEART FAILURE, CHRONIC (ICD-428.42) I think she is euvolemic. With her soft blood pressure I would be hesitant to titrate meds further. She does very well when her family is staying on top of her volume and salt and they are excellent with this. I will check a basic metabolic profile today. Orders: TLB-BMP (Basic Metabolic Panel-BMET) (80048-METABOL)  Problem # 2:  HYPERTENSION (ICD-401.9) Her blood pressures actually running low with the meds as listed.  However, she is tolerating this regimen and I will continue this. Orders: TLB-BMP (Basic Metabolic Panel-BMET) (80048-METABOL)  Patient Instructions: 1)  Your physician recommends that you schedule a follow-up appointment in: 6 months with Dr Antoine Poche 2)  Your physician recommends that you continue on your current medications as directed. Please refer to the Current Medication list given to you today.

## 2010-03-10 NOTE — Progress Notes (Signed)
Summary: pt needs refill  Phone Note Refill Request Call back at Home Phone [phone] Message from:  Patient on [facility] [address]  Refills Requested: Medication #1:  lasix  Medication #2:  ZAROXOLYN 2.5 MG TABS 30 minutes prior to lasix 2 times weekly.  Medication #3:  FUROSEMIDE 40 MG TABS 1 by mouth daily Initial call taken by: Omer Jack,  August 05, 2009 10:46 AM  Follow-up for Phone Call        Phone number is invalid at this time, called pharmacy, pt has refills left on her RX. Will try to call pt again. Marrion Coy, CNA  August 05, 2009 11:25 AM  Follow-up by: Marrion Coy, CNA,  August 05, 2009 11:25 AM  Additional Follow-up for Phone Call Additional follow up Details #1::        [facility] for pt to call back. Marrion Coy, CNA  August 07, 2009 2:03 PM  Spoke with daughter.  Marrion Coy, CNA  August 08, 2009 3:05 PM  Additional Follow-up by: Marrion Coy, CNA,  August 07, 2009 2:03 PM    New/Updated Medications: FUROSEMIDE 80 MG TABS (FUROSEMIDE) 1 by mouth two times a day Prescriptions: FUROSEMIDE 80 MG TABS (FUROSEMIDE) 1 by mouth two times a day  #60 x 6   Entered by:   Marrion Coy, CNA   Authorized by:   Rollene Rotunda, MD, [facility]   Signed by:   Marrion Coy, CNA on 08/08/2009   Method used:   Electronically to        [facility] [address] (retail)       [address]       [place], [place]  [id number]       Ph: [phone]       Fax: [phone]   RxID:   [id number]  Pt daughter aware on how pt is to take medication. Marrion Coy, CNA  August 08, 2009 3:04 PM

## 2010-03-10 NOTE — Letter (Signed)
Summary: Vitals Log  Vitals Log   Imported By: Marylou Mccoy 07/15/2009 14:15:06  _____________________________________________________________________  External Attachment:    Type:   Image     Comment:   External Document

## 2010-03-10 NOTE — Assessment & Plan Note (Signed)
Summary: chronic constipation--ch.   History of Present Illness Visit Type: Initial Visit Primary GI MD: Melvia Heaps MD Dr [facility] Primary Collyn Selk: Dr. Margretta Ditty, MD Chief Complaint: chronic constipation, patient stool are dark due to taking iron History of Present Illness:   [name] is a 76yo AA female referred at  the request of Dr. Antoine Poche for evaluation of constipation.  She has a prolapsed bladder that protrudes intto the vagina.  She was treated with a pessary with improvement in her urination and her bowel movements.  Although she moves her bowels daily she has the sensation of incomplete evacuation.  She takes bisacodyl and MiraLax daily.  She apparently underwent colonoscopy 3 years ago. There is no history of lower abdominal pain, melena or hematochezia.  She takes iron daily.  CT Scan of the abdomen and pelvis from July, 2011 performed because of lower abdominal pain demonstrated no acute changes.  Evaluation of the bowel was limited secondary to under distention an incomplete filling with contrast.   GI Review of Systems    Reports abdominal pain and  bloating.     Location of  Abdominal pain: lower abdomen.    Denies acid reflux, belching, chest pain, dysphagia with liquids, dysphagia with solids, heartburn, loss of appetite, nausea, vomiting, vomiting blood, weight loss, and  weight gain.      Reports constipation.     Denies anal fissure, black tarry stools, change in bowel habit, diarrhea, diverticulosis, fecal incontinence, heme positive stool, hemorrhoids, irritable bowel syndrome, jaundice, light color stool, liver problems, rectal bleeding, and  rectal pain.    Current Medications (verified): 1)  Coreg 3.125 Mg Tabs (Carvedilol) .Marland Kitchen.. 1 Tab Two Times A Day 2)  Lisinopril 10 Mg Tabs (Lisinopril) .... Once Daily 3)  Polyethylene Glycol 1450  Liqd (Polyethylene Glycol 1450) .Marland Kitchen.. 17gms Every Two Days 4)  Aspirin 81 Mg Tbec (Aspirin) .... Take One Tablet By Mouth  Daily 5)  Ferretts 325 (106 Fe) Mg Tabs (Ferrous Fumarate) .... One Two Times A Day 6)  Loratadine-Pseudoephedrine 10-240 Mg Xr24h-Tab (Loratadine-Pseudoephedrine) .... Onr Tab  As Needed  For Runny Nose 7)  Furosemide 80 Mg Tabs (Furosemide) .Marland Kitchen.. 1 By Mouth Two Times A Day 8)  Klor-Con M20 20 Meq Cr-Tabs (Potassium Chloride Crys Cr) .... 2 By Mouth Two Times A Day 9)  Zaroxolyn 2.5 Mg Tabs (Metolazone) .... 30 Minutes Prior To Lasix 2 Times Weekly 10)  Premarin 0.625 Mg/gm Crea (Estrogens, Conjugated) .... Weekly 11)  Bisacodyl Ec 5 Mg Tbec (Bisacodyl) .... Take 1 Tablet By Mouth Once A Day 12)  Tramadol Hcl 50 Mg Tabs (Tramadol Hcl) .... As Needed 13)  Cipro 500 Mg Tabs (Ciprofloxacin Hcl) .... Take 1 Tablet Two Times A Day For Seven Days  Allergies (verified): 1)  ! Pcn 2)  ! Diphtheria-Tetanus Toxoids (Diphtheria-Tetanus Toxoids Dt) 3)  ! * Latex  Past History:  Past Medical History:  1. Hypertension. x years  2. She has had multiple prior visits to the emergency room with       various complaints, many musculoskeletal and a few abdominal       complaints.    3. History of diverticulosis.   4. Hemorrhoids.   5. Chronic constipation.   6. Osteoarthritis.   7. Cardiomyopathy (EF 40 - 45%) Urinary Tract Infection  Past Surgical History:  Transvaginal hysterectomy 12/21/2008 Wrist surgery (fracture)  Family History: Reviewed history from 03/04/2009 and no changes required.  Her father died unknown age, unknown cause.  Mother died   unknown age, unknown cause. She has several siblings with a family  propensity for diabetes and hypertension. She had 1 brother with leukemia. One brother status post CABG (age 29).      Social History: Reviewed history from 03/04/2009 and no changes required. The patient lives alone. She is a retired Data processing manager. She still uses snuff tobacco. No prior smoking history. No alcohol or illicit drugs.      Review of Systems       The patient  complains of allergy/sinus, anemia, arthritis/joint pain, heart rhythm changes, muscle pains/cramps, and urine leakage.  The patient denies anxiety-new, back pain, blood in urine, breast changes/lumps, change in vision, confusion, cough, coughing up blood, depression-new, fainting, fatigue, fever, headaches-new, hearing problems, heart murmur, itching, menstrual pain, night sweats, nosebleeds, pregnancy symptoms, shortness of breath, skin rash, sleeping problems, sore throat, swelling of feet/legs, swollen lymph glands, thirst - excessive , urination - excessive , urination changes/pain, vision changes, and voice change.         All other systems were reviewed and were negative   Vital Signs:  Patient profile:   76 year old female Height:      61 inches Weight:      196.50 pounds BMI:     37.26 Pulse rate:   80 / minute Pulse rhythm:   regular BP sitting:   112 / 60  (left arm) Cuff size:   regular  Vitals Entered By: June McMurray CMA Duncan Dull) (October 20, 2009 1:53 PM)  Physical Exam  Additional Exam:  On physical exam she is a well-developed female  examed  while sitting in a wheelchair  skin: anicteric HEENT: normocephalic; PEERLA; no nasal or pharyngeal abnormalities neck: supple nodes: no cervical lymphadenopathy chest: clear to ausculatation and percussion heart: no murmurs, gallops, or rubs abd: soft, nontender; BS normoactive; no abdominal masses, tenderness, organomegaly rectal: deferred ext: no cynanosis, clubbing, edema skeletal: no deformities neuro: oriented x 3; no focal abnormalities    Impression & Recommendations:  Problem # 1:  CONSTIPATION, CHRONIC (ICD-564.09) By the patient's account this is a mild problem which is well-controlled on her current regimen of MiraLax every one to 2 days and bisacodyl.  Recommendations #1 I encouraged her to take fiber supplements daily and to add a stool softener. #2 review prior colonoscopy report  Problem # 2:   COMBINED HEART FAILURE, CHRONIC (ICD-428.42) Assessment: Comment Only  Problem # 3:  CARDIOMYOPATHY (ICD-425.4) Assessment: Comment Only  Patient Instructions: 1)  Copy sent to : Dr. Margretta Ditty, MD 2)  Take a stool softner daily 3)  The medication list was reviewed and reconciled.  All changed / newly prescribed medications were explained.  A complete medication list was provided to the patient / caregiver.

## 2010-03-10 NOTE — Miscellaneous (Signed)
Summary: Home Health Cerfication/Care Plan   Home Health Cerfication/Care Plan   Imported By: Roderic Ovens 10/20/2009 13:33:29  _____________________________________________________________________  External Attachment:    Type:   Image     Comment:   External Document

## 2010-03-10 NOTE — Progress Notes (Signed)
Summary: SWELLING FEET,LEGS,AND SOB  Phone Note Call from Patient Call back at Home Phone [phone]   Caller: Spouse/ CHEREYL Summary of Call: THE PT IS SWELLING IN FEET,LEGS,SOB Initial call taken by: Judie Grieve,  March 25, 2009 2:15 PM  Follow-up for Phone Call        at nursing home for rehab [facility]  swelling is back up in feet legs and abdomen, in pain, sob and anxious.  Sitting in wheelchair making edema worse.  She does not have a reclining chair there.  Daughter states pt is getting more and more worn out like she was prior to hospitalization.  Pt has been doing her rehab as ordered and following the low NA diet there at the rehab but states the food there is very salty to her.  Daughter on the way to see pt and will take pt to the ED if SOB is worse today than yesterday.  [facility] [phone] Spoke with Maxine Glenn at [facility] she does not seeing any increase in SOB however pt is not that active.  Difficult to tell if increased edema do to pt baseline wt.  She does complain of abdominal disconfort and wt is up 5lbs in 6 days.  She is on Furosemide 40mg  two twice a day or a total of 160 mg daily.  Will review with DOD and call back with any orders. Follow-up by: Charolotte Capuchin, RN,  March 25, 2009 2:59 PM  Additional Follow-up for Phone Call Additional follow up Details #1::        review above information with Dr Odessa Fleming.  pt to start zaroxlyn 2.5 mg one tablet 30 mins prior twice a week and have BMP in week.  continue daily weights, keep feet above the level of her heart and low NA diet.  Monica (nurse a rehab)aware of orders. Additional Follow-up by: Charolotte Capuchin, RN,  March 25, 2009 3:26 PM    Additional Follow-up for Phone Call Additional follow up Details #2::    Daughter aware of orders and will call if condition doesn't improve or worsen. Follow-up by: Charolotte Capuchin, RN,  March 25, 2009 3:28 PM

## 2010-03-10 NOTE — Miscellaneous (Signed)
Summary: [facility] Revision to Care Plan  [facility] Revision to Care Plan   Imported By: Roderic Ovens 07/08/2009 15:25:43  _____________________________________________________________________  External Attachment:    Type:   Image     Comment:   External Document

## 2010-03-10 NOTE — Letter (Signed)
Summary: New Patient letter  [facility]  [address] [place], [place] [address]   Phone: [phone]  Fax: [phone]       08/27/2009 MRN: [id number]  [facility] [address] [name], [place]  [address]  Dear [name],  Welcome to the Gastroenterology Division at [facility]    You are scheduled to see Dr.  Jarold Motto on 10-02-09 at 9:00a.m. on the 3rd floor at [facility], [address]  We ask that you try to arrive at our office 15 minutes prior to your appointment time to allow for check-in.  We would like you to complete the enclosed self-administered evaluation form prior to your visit and bring it with you on the day of your appointment.  We will review it with you.  Also, please bring a complete list of all your medications or, if you prefer, bring the medication bottles and we will list them.  Please bring your insurance card so that we may make a copy of it.  If your insurance requires a referral to see a specialist, please bring your referral form from your primary care physician.  Co-payments are due at the time of your visit and may be paid by cash, check or credit card.     Your office visit will consist of a consult with your physician (includes a physical exam), any laboratory testing he/she may order, scheduling of any necessary diagnostic testing (e.g. x-ray, ultrasound, CT-scan), and scheduling of a procedure (e.g. Endoscopy, Colonoscopy) if required.  Please allow enough time on your schedule to allow for any/all of these possibilities.    If you cannot keep your appointment, please call [phone] to cancel or reschedule prior to your appointment date.  This allows [name] the opportunity to schedule an appointment for another patient in need of care.  If you do not cancel or reschedule by 5 p.m. the business day prior to your appointment date, you will be charged a $50.00 late cancellation/no-show fee.    Thank you for choosing  [facility] for your medical needs.  We appreciate the opportunity to care for you.  Please visit [name] at our website  to learn more about our practice.                     Sincerely,                                                             The Gastroenterology Division

## 2010-03-12 NOTE — Miscellaneous (Signed)
Summary: Home Health Certification/Care Plan   Home Health Certification/Care Plan   Imported By: Roderic Ovens 03/02/2010 15:09:10  _____________________________________________________________________  External Attachment:    Type:   Image     Comment:   External Document

## 2010-04-07 NOTE — Letter (Signed)
Summary: Home Health Cert & Plan of Care  Home Health Cert & Plan of Care   Imported By: Marylou Mccoy 04/02/2010 13:12:43  _____________________________________________________________________  External Attachment:    Type:   Image     Comment:   External Document

## 2010-04-25 LAB — LIPID PANEL
HDL: 60 mg/dL (ref >39)
Triglycerides: 54 mg/dL (ref <150)

## 2010-04-25 LAB — BASIC METABOLIC PANEL
BUN: 22 mg/dL (ref 6–23)
BUN: 25 mg/dL — ABNORMAL HIGH (ref 6–23)
CO2: 30 mEq/L (ref 19–32)
Calcium: 8.7 mg/dL (ref 8.4–10.5)
Calcium: 8.8 mg/dL (ref 8.4–10.5)
Calcium: 9 mg/dL (ref 8.4–10.5)
Chloride: 93 mEq/L — ABNORMAL LOW (ref 96–112)
Creatinine, Ser: 1.11 mg/dL (ref 0.4–1.2)
Creatinine, Ser: 1.5 mg/dL — ABNORMAL HIGH (ref 0.4–1.2)
GFR calc Af Amer: 58 mL/min — ABNORMAL LOW (ref >60)
GFR calc non Af Amer: 34 mL/min — ABNORMAL LOW (ref >60)
GFR calc non Af Amer: 45 mL/min — ABNORMAL LOW (ref >60)
GFR calc non Af Amer: 60 mL/min — ABNORMAL LOW (ref >60)
Glucose, Bld: 101 mg/dL — ABNORMAL HIGH (ref 70–99)
Glucose, Bld: 85 mg/dL (ref 70–99)
Glucose, Bld: 86 mg/dL (ref 70–99)
Potassium: 3.2 mEq/L — ABNORMAL LOW (ref 3.5–5.1)
Sodium: 132 mEq/L — ABNORMAL LOW (ref 135–145)
Sodium: 133 mEq/L — ABNORMAL LOW (ref 135–145)

## 2010-04-25 LAB — URINALYSIS, ROUTINE W REFLEX MICROSCOPIC
Nitrite: NEGATIVE
Protein, ur: NEGATIVE mg/dL
Specific Gravity, Urine: 1.008 (ref 1.005–1.030)
Urobilinogen, UA: 0.2 mg/dL (ref 0.0–1.0)

## 2010-04-25 LAB — COMPREHENSIVE METABOLIC PANEL
ALT: <8 U/L (ref 0–35)
AST: 21 U/L (ref 0–37)
Calcium: 8.9 mg/dL (ref 8.4–10.5)
Creatinine, Ser: 0.92 mg/dL (ref 0.4–1.2)
GFR calc Af Amer: >60 mL/min (ref >60)
Sodium: 126 mEq/L — ABNORMAL LOW (ref 135–145)
Total Protein: 7.5 g/dL (ref 6.0–8.3)

## 2010-04-25 LAB — DIFFERENTIAL
Eosinophils Absolute: 0.2 10*3/uL (ref 0.0–0.7)
Eosinophils Relative: 2 % (ref 0–5)
Lymphocytes Relative: 5 % — ABNORMAL LOW (ref 12–46)
Lymphs Abs: 0.6 10*3/uL — ABNORMAL LOW (ref 0.7–4.0)
Monocytes Relative: 5 % (ref 3–12)

## 2010-04-25 LAB — URINE CULTURE

## 2010-04-25 LAB — URINE MICROSCOPIC-ADD ON

## 2010-04-25 LAB — CBC
MCHC: 33.3 g/dL (ref 30.0–36.0)
Platelets: 337 10*3/uL (ref 150–400)
RDW: 15.2 % (ref 11.5–15.5)

## 2010-04-25 LAB — CK TOTAL AND CKMB (NOT AT ARMC)
Relative Index: INVALID (ref 0.0–2.5)
Total CK: 44 U/L (ref 7–177)

## 2010-04-25 LAB — POCT CARDIAC MARKERS
CKMB, poc: <1 ng/mL — ABNORMAL LOW (ref 1.0–8.0)
Troponin i, poc: <0.05 ng/mL (ref 0.00–0.09)

## 2010-04-25 LAB — POCT I-STAT 3, VENOUS BLOOD GAS (G3P V)
Acid-Base Excess: 1 mmol/L (ref 0.0–2.0)
Patient temperature: 37
pH, Ven: 7.458 — ABNORMAL HIGH (ref 7.250–7.300)

## 2010-04-25 LAB — BRAIN NATRIURETIC PEPTIDE: Pro B Natriuretic peptide (BNP): 1004 pg/mL — ABNORMAL HIGH (ref 0.0–100.0)

## 2010-04-25 LAB — TROPONIN I: Troponin I: 0.02 ng/mL (ref 0.00–0.06)

## 2010-04-25 LAB — LIPASE, BLOOD: Lipase: 19 U/L (ref 11–59)

## 2010-04-26 LAB — HEPATIC FUNCTION PANEL
AST: 17 U/L (ref 0–37)
Albumin: 2.7 g/dL — ABNORMAL LOW (ref 3.5–5.2)
Total Bilirubin: 0.4 mg/dL (ref 0.3–1.2)
Total Protein: 5.8 g/dL — ABNORMAL LOW (ref 6.0–8.3)

## 2010-04-26 LAB — BASIC METABOLIC PANEL
CO2: 34 mEq/L — ABNORMAL HIGH (ref 19–32)
CO2: 37 mEq/L — ABNORMAL HIGH (ref 19–32)
Calcium: 8.2 mg/dL — ABNORMAL LOW (ref 8.4–10.5)
Calcium: 8.3 mg/dL — ABNORMAL LOW (ref 8.4–10.5)
Chloride: 94 mEq/L — ABNORMAL LOW (ref 96–112)
Chloride: 94 mEq/L — ABNORMAL LOW (ref 96–112)
Creatinine, Ser: 0.79 mg/dL (ref 0.4–1.2)
Creatinine, Ser: 0.89 mg/dL (ref 0.4–1.2)
GFR calc Af Amer: >60 mL/min (ref >60)
GFR calc Af Amer: >60 mL/min (ref >60)
GFR calc Af Amer: >60 mL/min (ref >60)
GFR calc Af Amer: >60 mL/min (ref >60)
GFR calc Af Amer: >60 mL/min (ref >60)
GFR calc non Af Amer: >60 mL/min (ref >60)
GFR calc non Af Amer: >60 mL/min (ref >60)
Glucose, Bld: 108 mg/dL — ABNORMAL HIGH (ref 70–99)
Potassium: 3.4 mEq/L — ABNORMAL LOW (ref 3.5–5.1)
Potassium: 4 mEq/L (ref 3.5–5.1)
Potassium: 4.1 mEq/L (ref 3.5–5.1)
Sodium: 132 mEq/L — ABNORMAL LOW (ref 135–145)
Sodium: 136 mEq/L (ref 135–145)
Sodium: 137 mEq/L (ref 135–145)

## 2010-04-26 LAB — CBC
MCHC: 31.5 g/dL (ref 30.0–36.0)
RBC: 2.92 MIL/uL — ABNORMAL LOW (ref 3.87–5.11)
WBC: 6 10*3/uL (ref 4.0–10.5)

## 2010-04-26 LAB — MAGNESIUM: Magnesium: 2 mg/dL (ref 1.5–2.5)

## 2010-04-26 LAB — MRSA PCR SCREENING: MRSA by PCR: POSITIVE — AB

## 2010-04-26 LAB — AMYLASE: Amylase: 27 U/L (ref 0–105)

## 2010-04-26 LAB — LIPASE, BLOOD: Lipase: 10 U/L — ABNORMAL LOW (ref 11–59)

## 2010-04-26 LAB — BRAIN NATRIURETIC PEPTIDE: Pro B Natriuretic peptide (BNP): 1194 pg/mL — ABNORMAL HIGH (ref 0.0–100.0)

## 2010-04-27 LAB — COMPREHENSIVE METABOLIC PANEL
AST: 20 U/L (ref 0–37)
Albumin: 2.9 g/dL — ABNORMAL LOW (ref 3.5–5.2)
Alkaline Phosphatase: 125 U/L — ABNORMAL HIGH (ref 39–117)
CO2: 26 mEq/L (ref 19–32)
Chloride: 97 mEq/L (ref 96–112)
GFR calc Af Amer: >60 mL/min (ref >60)
GFR calc non Af Amer: >60 mL/min (ref >60)
Potassium: 3.7 mEq/L (ref 3.5–5.1)
Total Bilirubin: 0.8 mg/dL (ref 0.3–1.2)

## 2010-04-27 LAB — COMPREHENSIVE METABOLIC PANEL WITH GFR
ALT: 13 U/L (ref 0–35)
BUN: 7 mg/dL (ref 6–23)
Calcium: 8.6 mg/dL (ref 8.4–10.5)
Creatinine, Ser: 0.79 mg/dL (ref 0.4–1.2)
Glucose, Bld: 89 mg/dL (ref 70–99)
Sodium: 133 meq/L — ABNORMAL LOW (ref 135–145)
Total Protein: 6.3 g/dL (ref 6.0–8.3)

## 2010-04-27 LAB — BASIC METABOLIC PANEL
BUN: 6 mg/dL (ref 6–23)
CO2: 28 mEq/L (ref 19–32)
Calcium: 8.3 mg/dL — ABNORMAL LOW (ref 8.4–10.5)
Creatinine, Ser: 0.88 mg/dL (ref 0.4–1.2)
GFR calc non Af Amer: >60 mL/min (ref >60)
Glucose, Bld: 75 mg/dL (ref 70–99)

## 2010-04-27 LAB — HEMOGLOBIN A1C
Hgb A1c MFr Bld: 6.1 % (ref 4.6–6.1)
Mean Plasma Glucose: 128 mg/dL

## 2010-04-27 LAB — CBC
Hemoglobin: 9 g/dL — ABNORMAL LOW (ref 12.0–15.0)
RBC: 3.08 MIL/uL — ABNORMAL LOW (ref 3.87–5.11)
WBC: 5.6 10*3/uL (ref 4.0–10.5)

## 2010-04-27 LAB — TSH: TSH: 3.628 u[IU]/mL (ref 0.350–4.500)

## 2010-04-27 LAB — BRAIN NATRIURETIC PEPTIDE: Pro B Natriuretic peptide (BNP): 510 pg/mL — ABNORMAL HIGH (ref 0.0–100.0)

## 2010-04-27 LAB — PROTIME-INR: Prothrombin Time: 15.3 seconds — ABNORMAL HIGH (ref 11.6–15.2)

## 2010-04-27 LAB — APTT: aPTT: 36 seconds (ref 24–37)

## 2010-04-29 LAB — BASIC METABOLIC PANEL
BUN: 13 mg/dL (ref 6–23)
BUN: 15 mg/dL (ref 6–23)
BUN: 16 mg/dL (ref 6–23)
BUN: 16 mg/dL (ref 6–23)
BUN: 18 mg/dL (ref 6–23)
BUN: 8 mg/dL (ref 6–23)
BUN: 9 mg/dL (ref 6–23)
Calcium: 8.5 mg/dL (ref 8.4–10.5)
Calcium: 8.6 mg/dL (ref 8.4–10.5)
Calcium: 8.7 mg/dL (ref 8.4–10.5)
Calcium: 8.7 mg/dL (ref 8.4–10.5)
Chloride: 101 mEq/L (ref 96–112)
Chloride: 101 mEq/L (ref 96–112)
Chloride: 93 mEq/L — ABNORMAL LOW (ref 96–112)
Chloride: 93 mEq/L — ABNORMAL LOW (ref 96–112)
Chloride: 96 mEq/L (ref 96–112)
Chloride: 99 mEq/L (ref 96–112)
Creatinine, Ser: 0.83 mg/dL (ref 0.4–1.2)
Creatinine, Ser: 0.84 mg/dL (ref 0.4–1.2)
Creatinine, Ser: 0.91 mg/dL (ref 0.4–1.2)
Creatinine, Ser: 0.96 mg/dL (ref 0.4–1.2)
Creatinine, Ser: 0.96 mg/dL (ref 0.4–1.2)
Creatinine, Ser: 0.97 mg/dL (ref 0.4–1.2)
GFR calc Af Amer: >60 mL/min (ref >60)
GFR calc Af Amer: >60 mL/min (ref >60)
GFR calc Af Amer: >60 mL/min (ref >60)
GFR calc Af Amer: >60 mL/min (ref >60)
GFR calc non Af Amer: 54 mL/min — ABNORMAL LOW (ref >60)
GFR calc non Af Amer: 56 mL/min — ABNORMAL LOW (ref >60)
GFR calc non Af Amer: 56 mL/min — ABNORMAL LOW (ref >60)
GFR calc non Af Amer: >60 mL/min (ref >60)
Glucose, Bld: 87 mg/dL (ref 70–99)
Glucose, Bld: 93 mg/dL (ref 70–99)
Glucose, Bld: 94 mg/dL (ref 70–99)
Glucose, Bld: 95 mg/dL (ref 70–99)
Glucose, Bld: 99 mg/dL (ref 70–99)
Potassium: 3.8 mEq/L (ref 3.5–5.1)
Potassium: 3.9 mEq/L (ref 3.5–5.1)
Potassium: 4 mEq/L (ref 3.5–5.1)
Potassium: 4.2 mEq/L (ref 3.5–5.1)
Sodium: 131 mEq/L — ABNORMAL LOW (ref 135–145)
Sodium: 133 mEq/L — ABNORMAL LOW (ref 135–145)
Sodium: 135 mEq/L (ref 135–145)
Sodium: 137 mEq/L (ref 135–145)

## 2010-04-29 LAB — CBC
HCT: 27 % — ABNORMAL LOW (ref 36.0–46.0)
HCT: 27.2 % — ABNORMAL LOW (ref 36.0–46.0)
HCT: 27.3 % — ABNORMAL LOW (ref 36.0–46.0)
Hemoglobin: 8.8 g/dL — ABNORMAL LOW (ref 12.0–15.0)
MCHC: 32.2 g/dL (ref 30.0–36.0)
MCV: 91.6 fL (ref 78.0–100.0)
MCV: 91.7 fL (ref 78.0–100.0)
MCV: 92.9 fL (ref 78.0–100.0)
MCV: 92.9 fL (ref 78.0–100.0)
Platelets: 318 10*3/uL (ref 150–400)
Platelets: 319 10*3/uL (ref 150–400)
Platelets: 324 10*3/uL (ref 150–400)
Platelets: 326 10*3/uL (ref 150–400)
RBC: 2.95 MIL/uL — ABNORMAL LOW (ref 3.87–5.11)
RDW: 16.5 % — ABNORMAL HIGH (ref 11.5–15.5)
RDW: 16.7 % — ABNORMAL HIGH (ref 11.5–15.5)
WBC: 5.5 10*3/uL (ref 4.0–10.5)
WBC: 6 10*3/uL (ref 4.0–10.5)
WBC: 6.2 10*3/uL (ref 4.0–10.5)
WBC: 6.4 10*3/uL (ref 4.0–10.5)
WBC: 6.4 10*3/uL (ref 4.0–10.5)

## 2010-04-29 LAB — MAGNESIUM: Magnesium: 2.4 mg/dL (ref 1.5–2.5)

## 2010-05-05 ENCOUNTER — Telehealth: Payer: Self-pay | Admitting: Cardiology

## 2010-05-05 NOTE — Telephone Encounter (Signed)
Daughter, [name], brought in form from [employer] for pt.  Form needed to be filled out for POC oxygen. Pt does not have a portable oxygen concentrator.  They cannot take the tank onboard.  Also through calling supply company found that the pt CMN had expired on 04/18/10.  Located PCP and they will fax the CMN form to new PCP Dr. Maurice Small.  Pt daughter is aware of all of this.  I also talked with Synetta Fail at [place], supplier of oxygen to pt, she will contact daughter re: locating poc  For travel. Mylo Red RN

## 2010-05-05 NOTE — Telephone Encounter (Signed)
Pt daughter will be dropping off paper to the office for oxygen for travel. Pt is traveling on Thursday.

## 2010-05-11 LAB — BASIC METABOLIC PANEL
BUN: 10 mg/dL (ref 6–23)
CO2: 28 mEq/L (ref 19–32)
CO2: 29 mEq/L (ref 19–32)
Calcium: 8.3 mg/dL — ABNORMAL LOW (ref 8.4–10.5)
Calcium: 8.4 mg/dL (ref 8.4–10.5)
Chloride: 92 mEq/L — ABNORMAL LOW (ref 96–112)
Chloride: 98 mEq/L (ref 96–112)
Creatinine, Ser: 1.2 mg/dL (ref 0.4–1.2)
GFR calc Af Amer: 52 mL/min — ABNORMAL LOW (ref >60)
Glucose, Bld: 102 mg/dL — ABNORMAL HIGH (ref 70–99)
Potassium: 3.9 mEq/L (ref 3.5–5.1)
Sodium: 128 mEq/L — ABNORMAL LOW (ref 135–145)

## 2010-05-11 LAB — CBC
HCT: 27.2 % — ABNORMAL LOW (ref 36.0–46.0)
Hemoglobin: 8.7 g/dL — ABNORMAL LOW (ref 12.0–15.0)
MCHC: 32.1 g/dL (ref 30.0–36.0)
RBC: 3.01 MIL/uL — ABNORMAL LOW (ref 3.87–5.11)

## 2010-05-11 LAB — URINE CULTURE: Special Requests: NEGATIVE

## 2010-05-11 LAB — HEPATIC FUNCTION PANEL
ALT: 10 U/L (ref 0–35)
Alkaline Phosphatase: 96 U/L (ref 39–117)
Bilirubin, Direct: 0.1 mg/dL (ref 0.0–0.3)
Indirect Bilirubin: 0.4 mg/dL (ref 0.3–0.9)
Total Bilirubin: 0.5 mg/dL (ref 0.3–1.2)

## 2010-05-12 LAB — URINALYSIS, ROUTINE W REFLEX MICROSCOPIC
Bilirubin Urine: NEGATIVE
Glucose, UA: NEGATIVE mg/dL
Glucose, UA: NEGATIVE mg/dL
Hgb urine dipstick: NEGATIVE
Ketones, ur: NEGATIVE mg/dL
Specific Gravity, Urine: 1.016 (ref 1.005–1.030)
pH: 6 (ref 5.0–8.0)
pH: 6.5 (ref 5.0–8.0)

## 2010-05-12 LAB — URINE MICROSCOPIC-ADD ON

## 2010-05-12 LAB — CBC
HCT: 28.8 % — ABNORMAL LOW (ref 36.0–46.0)
HCT: 30.5 % — ABNORMAL LOW (ref 36.0–46.0)
Hemoglobin: 9.2 g/dL — ABNORMAL LOW (ref 12.0–15.0)
Hemoglobin: 9.7 g/dL — ABNORMAL LOW (ref 12.0–15.0)
MCHC: 31.7 g/dL (ref 30.0–36.0)
MCHC: 31.8 g/dL (ref 30.0–36.0)
MCHC: 31.9 g/dL (ref 30.0–36.0)
MCV: 91.7 fL (ref 78.0–100.0)
MCV: 92 fL (ref 78.0–100.0)
MCV: 92.2 fL (ref 78.0–100.0)
Platelets: 345 10*3/uL (ref 150–400)
Platelets: 359 10*3/uL (ref 150–400)
Platelets: 392 10*3/uL (ref 150–400)
RBC: 3.13 MIL/uL — ABNORMAL LOW (ref 3.87–5.11)
RBC: 3.31 MIL/uL — ABNORMAL LOW (ref 3.87–5.11)
RDW: 17.2 % — ABNORMAL HIGH (ref 11.5–15.5)
RDW: 17.8 % — ABNORMAL HIGH (ref 11.5–15.5)
WBC: 4.9 10*3/uL (ref 4.0–10.5)
WBC: 5.2 10*3/uL (ref 4.0–10.5)

## 2010-05-12 LAB — CARDIAC PANEL(CRET KIN+CKTOT+MB+TROPI)
CK, MB: 1.5 ng/mL (ref 0.3–4.0)
CK, MB: 1.9 ng/mL (ref 0.3–4.0)
CK, MB: 2.3 ng/mL (ref 0.3–4.0)
Relative Index: 1.8 (ref 0.0–2.5)
Relative Index: 1.9 (ref 0.0–2.5)
Relative Index: INVALID (ref 0.0–2.5)
Total CK: 106 U/L (ref 7–177)
Total CK: 122 U/L (ref 7–177)
Total CK: 85 U/L (ref 7–177)
Troponin I: 0.02 ng/mL (ref 0.00–0.06)
Troponin I: 0.04 ng/mL (ref 0.00–0.06)
Troponin I: 0.04 ng/mL (ref 0.00–0.06)

## 2010-05-12 LAB — POCT CARDIAC MARKERS
CKMB, poc: 2.8 ng/mL (ref 1.0–8.0)
CKMB, poc: 2.9 ng/mL (ref 1.0–8.0)
Myoglobin, poc: 129 ng/mL (ref 12–200)
Myoglobin, poc: 133 ng/mL (ref 12–200)
Troponin i, poc: <0.05 ng/mL (ref 0.00–0.09)
Troponin i, poc: <0.05 ng/mL (ref 0.00–0.09)

## 2010-05-12 LAB — BASIC METABOLIC PANEL
BUN: 4 mg/dL — ABNORMAL LOW (ref 6–23)
CO2: 28 mEq/L (ref 19–32)
CO2: 29 mEq/L (ref 19–32)
Calcium: 8.6 mg/dL (ref 8.4–10.5)
Calcium: 8.7 mg/dL (ref 8.4–10.5)
Chloride: 104 mEq/L (ref 96–112)
Chloride: 105 mEq/L (ref 96–112)
Creatinine, Ser: 1.22 mg/dL — ABNORMAL HIGH (ref 0.4–1.2)
GFR calc Af Amer: 52 mL/min — ABNORMAL LOW (ref >60)
GFR calc Af Amer: >60 mL/min (ref >60)
GFR calc non Af Amer: 43 mL/min — ABNORMAL LOW (ref >60)
Glucose, Bld: 95 mg/dL (ref 70–99)
Potassium: 3.8 mEq/L (ref 3.5–5.1)
Sodium: 139 mEq/L (ref 135–145)
Sodium: 140 mEq/L (ref 135–145)

## 2010-05-12 LAB — COMPREHENSIVE METABOLIC PANEL
AST: 16 U/L (ref 0–37)
Albumin: 3.9 g/dL (ref 3.5–5.2)
Calcium: 9.4 mg/dL (ref 8.4–10.5)
Creatinine, Ser: 1.08 mg/dL (ref 0.4–1.2)
GFR calc Af Amer: >60 mL/min (ref >60)
Total Protein: 7.6 g/dL (ref 6.0–8.3)

## 2010-05-12 LAB — CALCIUM: Calcium: 8.8 mg/dL (ref 8.4–10.5)

## 2010-05-12 LAB — IRON AND TIBC
Iron: 23 ug/dL — ABNORMAL LOW (ref 42–135)
Saturation Ratios: 8 % — ABNORMAL LOW (ref 20–55)
TIBC: 282 ug/dL (ref 250–470)
UIBC: 259 ug/dL

## 2010-05-12 LAB — TSH: TSH: 1.694 u[IU]/mL (ref 0.350–4.500)

## 2010-05-12 LAB — LIPID PANEL
Cholesterol: 141 mg/dL (ref 0–200)
HDL: 74 mg/dL (ref >39)
LDL Cholesterol: 51 mg/dL (ref 0–99)
Total CHOL/HDL Ratio: 1.9 RATIO
Triglycerides: 79 mg/dL (ref <150)
VLDL: 16 mg/dL (ref 0–40)

## 2010-05-12 LAB — DIFFERENTIAL
Eosinophils Relative: 2 % (ref 0–5)
Lymphocytes Relative: 14 % (ref 12–46)
Lymphs Abs: 0.7 10*3/uL (ref 0.7–4.0)
Monocytes Absolute: 0.2 10*3/uL (ref 0.1–1.0)

## 2010-05-12 LAB — RETICULOCYTES
RBC.: 3.49 MIL/uL — ABNORMAL LOW (ref 3.87–5.11)
Retic Count, Absolute: 48.9 10*3/uL (ref 19.0–186.0)
Retic Ct Pct: 1.4 % (ref 0.4–3.1)

## 2010-05-12 LAB — URINE CULTURE: Colony Count: 25000

## 2010-05-12 LAB — APTT: aPTT: 39 seconds — ABNORMAL HIGH (ref 24–37)

## 2010-05-12 LAB — PROTIME-INR
INR: 1.08 (ref 0.00–1.49)
Prothrombin Time: 13.9 seconds (ref 11.6–15.2)

## 2010-05-12 LAB — FERRITIN: Ferritin: 53 ng/mL (ref 10–291)

## 2010-05-12 LAB — FOLATE: Folate: 6.9 ng/mL

## 2010-05-12 LAB — PHOSPHORUS: Phosphorus: 3 mg/dL (ref 2.3–4.6)

## 2010-05-12 LAB — SEDIMENTATION RATE: Sed Rate: 19 mm/hr (ref 0–22)

## 2010-05-12 NOTE — Telephone Encounter (Signed)
I called daughter because I had never received paperwork as described that the pt needed to travel on Thursday.  Spoke with daughter who states [employer] got it all worked out and that we didn't need to do anything.

## 2010-05-15 LAB — COMPREHENSIVE METABOLIC PANEL
ALT: 12 U/L (ref 0–35)
AST: 15 U/L (ref 0–37)
Albumin: 3.7 g/dL (ref 3.5–5.2)
CO2: 25 mEq/L (ref 19–32)
Calcium: 9 mg/dL (ref 8.4–10.5)
Creatinine, Ser: 0.82 mg/dL (ref 0.4–1.2)
GFR calc Af Amer: >60 mL/min (ref >60)
GFR calc non Af Amer: >60 mL/min (ref >60)
Sodium: 137 mEq/L (ref 135–145)

## 2010-05-15 LAB — URINE CULTURE: Colony Count: 25000

## 2010-05-15 LAB — URINALYSIS, ROUTINE W REFLEX MICROSCOPIC
Bilirubin Urine: NEGATIVE
Glucose, UA: NEGATIVE mg/dL
Hgb urine dipstick: NEGATIVE
Ketones, ur: NEGATIVE mg/dL
Nitrite: NEGATIVE
Specific Gravity, Urine: 1.005 (ref 1.005–1.030)
pH: 7 (ref 5.0–8.0)

## 2010-05-15 LAB — CBC
MCHC: 32.9 g/dL (ref 30.0–36.0)
MCV: 96 fL (ref 78.0–100.0)
Platelets: 404 10*3/uL — ABNORMAL HIGH (ref 150–400)
RBC: 3.47 MIL/uL — ABNORMAL LOW (ref 3.87–5.11)
WBC: 6.8 10*3/uL (ref 4.0–10.5)

## 2010-05-15 LAB — DIFFERENTIAL
Eosinophils Absolute: 0.3 10*3/uL (ref 0.0–0.7)
Eosinophils Relative: 4 % (ref 0–5)
Lymphocytes Relative: 22 % (ref 12–46)
Lymphs Abs: 1.5 10*3/uL (ref 0.7–4.0)
Monocytes Absolute: 0.4 10*3/uL (ref 0.1–1.0)
Monocytes Relative: 6 % (ref 3–12)

## 2010-05-15 LAB — POCT CARDIAC MARKERS: Myoglobin, poc: 62.1 ng/mL (ref 12–200)

## 2010-05-16 LAB — POCT CARDIAC MARKERS

## 2010-05-16 LAB — DIFFERENTIAL
Basophils Relative: 0 % (ref 0–1)
Eosinophils Absolute: 0.3 10*3/uL (ref 0.0–0.7)
Eosinophils Relative: 4 % (ref 0–5)
Lymphs Abs: 1.1 10*3/uL (ref 0.7–4.0)

## 2010-05-16 LAB — CBC
HCT: 36.7 % (ref 36.0–46.0)
MCHC: 33.2 g/dL (ref 30.0–36.0)
MCV: 94.5 fL (ref 78.0–100.0)
Platelets: 379 10*3/uL (ref 150–400)
WBC: 5.9 10*3/uL (ref 4.0–10.5)

## 2010-06-03 ENCOUNTER — Encounter: Payer: Self-pay | Admitting: *Deleted

## 2010-06-03 ENCOUNTER — Encounter: Payer: Self-pay | Admitting: Cardiology

## 2010-06-04 ENCOUNTER — Ambulatory Visit (INDEPENDENT_AMBULATORY_CARE_PROVIDER_SITE_OTHER): Payer: Medicare Other | Admitting: Cardiology

## 2010-06-04 ENCOUNTER — Encounter: Payer: Self-pay | Admitting: Cardiology

## 2010-06-04 DIAGNOSIS — I10 Essential (primary) hypertension: Secondary | ICD-10-CM

## 2010-06-04 DIAGNOSIS — I340 Nonrheumatic mitral (valve) insufficiency: Secondary | ICD-10-CM | POA: Insufficient documentation

## 2010-06-04 DIAGNOSIS — I5042 Chronic combined systolic (congestive) and diastolic (congestive) heart failure: Secondary | ICD-10-CM

## 2010-06-04 DIAGNOSIS — I059 Rheumatic mitral valve disease, unspecified: Secondary | ICD-10-CM

## 2010-06-04 DIAGNOSIS — I428 Other cardiomyopathies: Secondary | ICD-10-CM

## 2010-06-04 LAB — BASIC METABOLIC PANEL
BUN: 14 mg/dL (ref 6–23)
CO2: 25 mEq/L (ref 19–32)
Glucose, Bld: 90 mg/dL (ref 70–99)
Potassium: 4 mEq/L (ref 3.5–5.1)
Sodium: 126 mEq/L — ABNORMAL LOW (ref 135–145)

## 2010-06-04 NOTE — Progress Notes (Signed)
HPI The patient presents for followup of her mixed heart failure. She is doing quite well since I last saw her. She is not having any new shortness of breath and actually has taken herself off of oxygen. She denies any PND or orthopnea. She having no palpitations, presyncope or syncope. She's not had any chest discomfort. Her family is doing next coronary job helping her watch fluid and salt. Her weight has come down nicely over the past months.  Allergies  Allergen Reactions  . Latex   . Penicillins   . Tenivac     Current Outpatient Prescriptions  Medication Sig Dispense Refill  . aspirin 81 MG tablet Take 81 mg by mouth daily.        . bisacodyl (DULCOLAX) 5 MG EC tablet Take 5 mg by mouth daily as needed.        . carvedilol (COREG) 3.125 MG tablet Take 3.125 mg by mouth 2 (two) times daily with a meal.        . estrogens, conjugated, (PREMARIN) 0.625 MG tablet Take 0.625 mg by mouth daily. Take daily for 21 days then do not take for 7 days.       . ferrous fumarate (FERRETTS) 325 (106 FE) MG TABS Take by mouth 2 (two) times daily.        . furosemide (LASIX) 80 MG tablet Take 80 mg by mouth 2 (two) times daily.        Marland Kitchen guaiFENesin (MUCINEX) 600 MG 12 hr tablet Take 600 mg by mouth 2 (two) times daily.        Marland Kitchen lisinopril (PRINIVIL,ZESTRIL) 10 MG tablet Take 10 mg by mouth daily.        Marland Kitchen loratadine (CLARITIN) 10 MG tablet Take 10 mg by mouth daily as needed.       . metolazone (ZAROXOLYN) 2.5 MG tablet Take 2.5 mg by mouth. Tuesday and Friday morning before taking Lasix       . Trospium Chloride (SANCTURA XR) 60 MG CP24 1 tab po qd       . DISCONTD: fesoterodine (TOVIAZ) 4 MG TB24 Take 4 mg by mouth daily.          Past Medical History  Diagnosis Date  . HTN (hypertension)   . Diverticulosis   . Hemorrhoid   . Osteoarthritis   . Cardiomyopathy   . UTI (lower urinary tract infection)   . H/O: hysterectomy     Past Surgical History  Procedure Date  . Wrist surgery      ROS:  Aches and pains, sinus trouble.  Otherwise as stated in the HPI and negative for all other systems.  PHYSICAL EXAM BP 110/70  Pulse 66  Resp 18  Ht 5\' 1"  (1.549 m)  Wt 210 lb 6.4 oz (95.437 kg)  BMI 39.75 kg/m2 GENERAL:  Well appearing HEENT:  Pupils equal round and reactive, fundi not visualized, oral mucosa unremarkable, poor dentition NECK:  No jugular venous distention, waveform within normal limits, carotid upstroke brisk and symmetric, no bruits, no thyromegaly LYMPHATICS:  No cervical, inguinal adenopathy LUNGS:  Clear to auscultation bilaterally BACK:  No CVA tenderness CHEST:  Unremarkable HEART:  PMI not displaced or sustained,S1 and S2 within normal limits, no S3, no S4, no clicks, no rubs, no murmurs ABD:  Flat, positive bowel sounds normal in frequency in pitch, no bruits, no rebound, no guarding, no midline pulsatile mass, no hepatomegaly, no splenomegaly, obesity EXT:  2 plus pulses throughout, no edema, no cyanosis  no clubbing SKIN:  No rashes no nodules NEURO:  Cranial nerves II through XII grossly intact, motor grossly intact throughout PSYCH:  Cognitively intact, oriented to person place and time   EKG:  Sinus rhythm, left bundle branch block, left axis deviation  ASSESSMENT AND PLAN

## 2010-06-04 NOTE — Assessment & Plan Note (Signed)
The blood pressure is at target. No change in medications is indicated. We will continue with therapeutic lifestyle changes (TLC).  

## 2010-06-04 NOTE — Assessment & Plan Note (Signed)
The patient is doing well with her EF of 45%. She will continue with medical management.

## 2010-06-04 NOTE — Patient Instructions (Signed)
Have basic metabolic panel today Continue current medications Follow up in one year with Dr Antoine Poche

## 2010-06-04 NOTE — Assessment & Plan Note (Signed)
This was moderate on echo in 2010. We are treating this symptomatically but I will consider further followup echocardiography in the future. She would be a poor surgical candidate for repair

## 2010-06-08 NOTE — Progress Notes (Signed)
Pt has been taking Lasix 80 mg twice a day as well as metolazone.  Daughter wants to verify with Dr Antoine Poche that pt should decrease to 40 mg once daily.  She also questions whether pt is to remain on metolazone.  Daughter aware I will ask Dr Antoine Poche and call her back

## 2010-06-09 NOTE — Progress Notes (Signed)
Addended by: Rocco Serene on: 06/09/2010 05:44 PM   Modules accepted: Orders

## 2010-06-09 NOTE — Progress Notes (Signed)
Pt's daughter is aware that pt needs repeat labs 06/10/2010

## 2010-06-10 ENCOUNTER — Other Ambulatory Visit: Payer: Medicare Other | Admitting: *Deleted

## 2010-06-11 ENCOUNTER — Other Ambulatory Visit (INDEPENDENT_AMBULATORY_CARE_PROVIDER_SITE_OTHER): Payer: Medicare Other | Admitting: *Deleted

## 2010-06-11 DIAGNOSIS — I10 Essential (primary) hypertension: Secondary | ICD-10-CM

## 2010-06-11 DIAGNOSIS — I428 Other cardiomyopathies: Secondary | ICD-10-CM

## 2010-06-11 LAB — BASIC METABOLIC PANEL
BUN: 17 mg/dL (ref 6–23)
CO2: 24 mEq/L (ref 19–32)
Chloride: 87 mEq/L — ABNORMAL LOW (ref 96–112)
Creatinine, Ser: 1.1 mg/dL (ref 0.4–1.2)

## 2010-06-15 ENCOUNTER — Telehealth: Payer: Self-pay | Admitting: *Deleted

## 2010-06-15 NOTE — Telephone Encounter (Signed)
LM FOR [name] TO CALL BACK .Zack Seal

## 2010-06-15 NOTE — Telephone Encounter (Signed)
SPOKE WITH PT  IS AWARE OF LAB RESULTS AND MED CHANGES REQUESTS OFFICE TO CALL DAUGHTER WHOM HELPS WITH MEDS  ATTEMPTED TO CALL DAUGHTER [name] AT [phone]  WITH NEW DIRECTIONS PER DR HOCHREIN STOP ZAROXOLYN AND ONLY TAKE 40 MG OF LASIX A DYA AND HAVE 1000 CC FLUID RESTRICTION REPEAT BMET ON Thursday./CY

## 2010-06-15 NOTE — Telephone Encounter (Signed)
LM FOR DAUHGTER TO CALL BACK RE ABN LABS ./CY

## 2010-06-18 ENCOUNTER — Other Ambulatory Visit: Payer: Medicare Other | Admitting: *Deleted

## 2010-06-18 ENCOUNTER — Other Ambulatory Visit: Payer: Self-pay | Admitting: *Deleted

## 2010-06-18 DIAGNOSIS — Z79899 Other long term (current) drug therapy: Secondary | ICD-10-CM

## 2010-06-18 NOTE — Telephone Encounter (Signed)
DAUGHTER AWARE OF LAB RESULTS WILL BRING MOTHER IN TODAY OR TOM FOR REPEAT LAB./CY

## 2010-06-19 ENCOUNTER — Other Ambulatory Visit (INDEPENDENT_AMBULATORY_CARE_PROVIDER_SITE_OTHER): Payer: Medicare Other | Admitting: *Deleted

## 2010-06-19 DIAGNOSIS — Z79899 Other long term (current) drug therapy: Secondary | ICD-10-CM

## 2010-06-19 LAB — BASIC METABOLIC PANEL
BUN: 16 mg/dL (ref 6–23)
Calcium: 8.9 mg/dL (ref 8.4–10.5)
Creatinine, Ser: 0.9 mg/dL (ref 0.4–1.2)
GFR: 82.99 mL/min (ref >60.00)
Glucose, Bld: 79 mg/dL (ref 70–99)

## 2010-06-23 NOTE — H&P (Signed)
[name], [name]                 ACCOUNT NO.:  [id number]   MEDICAL RECORD NO.:  [id number]          PATIENT TYPE:  AMB   LOCATION:  SDC                           FACILITY:  [facility]   PHYSICIAN:  Charles A. Delcambre, MDDATE OF BIRTH:  15-Feb-1934   DATE OF ADMISSION:  DATE OF DISCHARGE:                              HISTORY & PHYSICAL   Patient to be admitted in approximately 1 week, to be admitted on  December 13, 2006, to undergo TBH, BSO, A&P repair.  All questions are  answered.  She is informed of the risks of infection, bleeding, bowel  and bladder damage, blood transfusion risk including hepatitis and HIV  exposure, possible failed retrieval of the ovaries via vaginal approach  and not proceeding with abdominal approach.  To retrieve ovaries, she  gives informed consent in this regard with residual risk of ovarian  cancer remaining but she prefers ovaries to be removed.  She understands  failed A&P and possibly having to repeat the procedure with a graft.  All questions were answered.  There could be damage to bowel, bladder,  or vascular structures that require laparotomy as well or ureteral  damage.  She gives informed consent.  Remain n.p.o. past midnight.   PAST MEDICAL HISTORY:  1. Spontaneous vaginal delivery x6.  2. Menopausal.  3. Negative vulvar biopsy.  4. Pap negative April 26, 2006.  5. Hypertension.  6. Diverticulosis.  7. Hemorrhoids.   SURGICAL HISTORY:  Bilateral tubal ligation in the 1960s.   MEDICATIONS:  1. She takes blood pressure medicine, begins with a T but she cannot      remember the medicine once a day.  2. Mucinex 2 tablets a day.  3. Stool softener once a day.   ALLERGIES:  PENICILLIN AND TETANUS, REACTION IS NOT SPECIFIED.   SOCIAL HISTORY:  She does snuff, otherwise no smoking tobacco or alcohol  or illicit drug use.   FAMILY HISTORY:  Negative for breast, ovarian, or colon cancer.   REVIEW OF SYSTEMS:  No fever, chills, nausea,  vomiting, diarrhea.  Occasional constipation.  No headaches or weight loss or weight gain,  dysuria, or urinary incontinence.   PHYSICAL EXAM:  Alert and oriented x3.  Blood pressure 150/60.  Respirations 22.  Heart rate 88.  Afebrile.  HEENT:  Exam grossly within normal limits.  NECK:  Supple without thyromegaly or adenopathy.  EXTREMITIES:  Arthritis of her knees, back, and neck, symptomatic but  I find nothing on exam today except for some blood Band-Aids that she  feels help her knees.  LUNGS:  Clear bilaterally.  HEART:  Regular rate and rhythm without murmur that I could detect.  Her  cardiologist did an EKG or some study about 2 weeks ago for a murmur  and was told this was a negative study.  ABDOMEN:  She is grossly obese.  PELVIC:  Exam normal external female genitalia.  Vault without discharge  or lesions.  With Valsalva, a large cystocele and cervix descends to,  evens comes out of the vaginal vault with a rectocele as well.  Valsalva  easily replaced.  She has no urinary complaints.  Adnexa indeterminate  secondary to patient's body habitus.  EXTREMITIES:  No significant edema.   ASSESSMENT:  1. Uterine prolapse.  2. Cystocele.  3. Rectocele.  4. Failed pessary use for both ring and tube.   PLAN:  Transvaginal hysterectomy, bilateral salpingo-oophorectomy,  anterior and posterior repair.  Preop CBC, CMET, and type and screen.  SCDs will be use perioperatively.  Fleet Enema evening prior to surgery.  N.p.o. past midnight.  Take hypertension medicine with a sip of water  morning of admission.  Questions were answered and we will proceed as  outlined.      Charles A. Sydnee Cabal, MD  Electronically Signed     CAD/MEDQ  D:  12/13/2006  T:  12/14/2006  Job:  [id number]

## 2010-06-23 NOTE — Op Note (Signed)
NAMEMARITSSA, [name]                 ACCOUNT NO.:  [id number]   MEDICAL RECORD NO.:  [id number]          PATIENT TYPE:  AMB   LOCATION:  SDC                           [facility]ACILITY:  [facility]   PHYSICIAN:  Charles A. Delcambre, MDDATE OF BIRTH:  1934-04-20   DATE OF PROCEDURE:  12/22/2006  DATE OF DISCHARGE:                               OPERATIVE REPORT   PREOPERATIVE DIAGNOSES:  1. Uterine prolapse.  2. Cystocele.  3. Small rectocele.   POSTOPERATIVE DIAGNOSES:  1. Uterine prolapse.  2. Cystocele.  3. Small rectocele.  4. Uterine fibroids.   PROCEDURE:  Transvaginal hysterectomy, anterior repair.   SURGEON:  Charles A. Sydnee Cabal, M.D.   Threasa HeadsRichardson Dopp.   Spinal anesthesia.   FINDINGS:  A 10-week uterus, 3-cm posterior fibroid.  Normal-appearing  ovaries, except there were very high and scarred and adhered to the  sidewalls.  Moderate cystocele.  Minimal rectocele.   SPECIMEN:  Uterus and cervix to pathology.   ESTIMATED BLOOD LOSS:  100 mL.   COMPLICATIONS:  None.  Instrument, sponge, and needle count correct x2.   DESCRIPTION OF PROCEDURE:  The patient was taken to the operating room,  placed in supine position after spinal was given.  Adequate anesthesia  was noted with spinal, and she was placed in dorsal lithotomy position  in universal stirrups.  Sterile prep and drape was undertaken.  Speculum  was placed in the vagina.  Lahey clamps were placed on the cervix.  Circumferential injection of 10 mL of 1% lidocaine with dilute  epinephrine was injected subcutaneously.  Uterus was scored  circumferentially.  Bladder pillars were cut.  Bladder mucosa was cut  away from the lower uterine segment.  A small bit of the Ray-Tec was  packed into this space, could not quite see with the long cervix as  expected with the prolapse.  The peritoneal reflection at this time,  posterior colpotomy was done, uterosacral ligament ligaments were cut  and transfixion stitched and held.   Colpotomy was done without  difficulty or injury to the rectum.  Successive pedicles were taken up  either side and transfixion stitched about the third pedicle on either  side.  I did have the uterine vessels in it, and these were transfixion  stitched as well and with good hemostasis.  After about two pedicles,  the bladder reflection was dissected further, and peritoneum was opened  safely without damage to bowel or bladder.  The remaining pedicles were  taken up to the utero-ovarian pedicles.  Then, the uterus was flipped  posteriorly.  Pedicles were isolated and cut.  Uterus was cut free, and  free ties and then transfixion stitches were placed on either side of  the utero-ovarian pedicles.  Hemostasis was excellent.  A small window  of peritoneum on the left pedicles was closed with 2-0 Vicryl with good  hemostasis resulting.  Attention was then placed to the anterior repair.  Allis clamps were used, and Metzenbaum scissors were used to take the  bladder mucosa up to just below the urethra.  These were dissected.  Vaginal flaps  were dissected with a knife and Metzenbaum scissors  bilaterally and some blunt dissection.  Bladder was adequately reflected  away from the vaginal mucosa.  Infundibulopelvic pedicles were then  grasped with 2-0 Vicryl and held.  Five such sutures plicated the  cystocele nicely when tied.  Excess vaginal mucosa was cut free.  Zero  Vicryl was then used to close the vaginal mucosa.  A single figure-of-  eight suture in the middle was used to achieve hemostasis adequately.  Cuff was then closed.  All pedicles had been visualized to be of good  hemostasis to keep the bowel out of the way for the anterior repair.  The peritoneum had been closed with 2-0 chromic.  Richardson angle  sutures were placed with 0 Vicryl on either side.  Sutures were cut.  A  0 Vicryl running locking was placed across the cuff horizontally to  close the cuff.  Hemostasis was excellent.   Vaginal pack with 2-inch  gauze with Estrace cream was placed, and the patient tolerated the  procedure well and was taken to recovery with physician in attendance.      Charles A. Sydnee Cabal, MD  Electronically Signed     CAD/MEDQ  D:  12/22/2006  T:  12/23/2006  Job:  [id number]

## 2010-06-23 NOTE — Discharge Summary (Signed)
NAMETONAE, LIVOLSI                 ACCOUNT NO.:  [id number]   MEDICAL RECORD NO.:  [id number]          PATIENT TYPE:  INP   LOCATION:  9318                          [facility]ACILITY:  [facility]   PHYSICIAN:  Charles A. Delcambre, MDDATE OF BIRTH:  04-18-1934   DATE OF ADMISSION:  12/22/2006  DATE OF DISCHARGE:  12/24/2006                               DISCHARGE SUMMARY   DISCHARGE DIAGNOSIS:  Uterine prolapse and cystocele.   PROCEDURE:  Transvaginal hysterectomy, anterior repair.   DISPOSITION:  The patient is discharged home to follow up in the office  in 1 month.  She was given convalescent instructions.  Notify if  temperature greater than 100 degrees, any significant vaginal bleeding,  increased pain, no driving for 2 weeks, no lifting for 6 weeks, no  sexual activity for 6 weeks or vaginal penetration.   DISCHARGE MEDICATIONS:  Prescription for Percocet 5/325 1-2 p.o. q.4.  p.r.n., #30 is given with instructions.  She states she will also take  Tylenol at home.   LABORATORY DATA:  Postoperative hematocrit 32.3, hemoglobin 11.0.  She  is recommended to take a vitamin once a day.   PATHOLOGY:  Uterus and cervix sent to pathology and is pending.   PHYSICAL EXAMINATION:  Dictated on the chart.   HOSPITAL COURSE:  The patient was admitted and underwent surgery, as  noted above.  She did well postoperatively.  She did have some continued  pain on postoperative day #1.  She was weak and had some calf pain.  Doppler was done and was negative for DVT.  She continued to ambulate  well and got stronger.  On postoperative day #2, she was ambulating  without difficulty.  Pain was controlled.  She had not had a bowel  movement.  She usually takes a laxative, prune juice, etc. at home and  will go back to that regimen.  She was discharged home with followup as  noted above on postoperative day #2.      Charles A. Sydnee Cabal, MD  Electronically Signed     CAD/MEDQ  D:  12/24/2006  T:   12/25/2006  Job:  [id number]

## 2010-08-24 ENCOUNTER — Emergency Department (HOSPITAL_COMMUNITY)
Admission: EM | Admit: 2010-08-24 | Discharge: 2010-08-25 | Disposition: A | Discharge status: Home / Self Care | Payer: Medicare Other | Attending: Emergency Medicine | Admitting: Emergency Medicine

## 2010-08-24 DIAGNOSIS — I509 Heart failure, unspecified: Secondary | ICD-10-CM | POA: Insufficient documentation

## 2010-08-24 DIAGNOSIS — I2510 Atherosclerotic heart disease of native coronary artery without angina pectoris: Secondary | ICD-10-CM | POA: Insufficient documentation

## 2010-08-24 DIAGNOSIS — Z9981 Dependence on supplemental oxygen: Secondary | ICD-10-CM | POA: Insufficient documentation

## 2010-08-24 DIAGNOSIS — I10 Essential (primary) hypertension: Secondary | ICD-10-CM | POA: Insufficient documentation

## 2010-08-24 DIAGNOSIS — Z79899 Other long term (current) drug therapy: Secondary | ICD-10-CM | POA: Insufficient documentation

## 2010-08-24 DIAGNOSIS — H5710 Ocular pain, unspecified eye: Secondary | ICD-10-CM | POA: Insufficient documentation

## 2010-08-24 DIAGNOSIS — H1089 Other conjunctivitis: Secondary | ICD-10-CM | POA: Insufficient documentation

## 2010-09-02 ENCOUNTER — Telehealth: Payer: Self-pay | Admitting: Cardiology

## 2010-09-02 NOTE — Telephone Encounter (Signed)
Pt's daughter states pt was seen in the ER a week ago for pink eye and all over  body pain . She said now pt has increased SOB ,  both Knees swollen. Pt would like to be seen soon. An appointment was made with Dr. Antoine Poche for tomorrow 09/03/10 at 3:45 PM daughter aware.

## 2010-09-02 NOTE — Telephone Encounter (Signed)
C/O Pt not doing any better. Went to er on last Thursday.

## 2010-09-03 ENCOUNTER — Ambulatory Visit (INDEPENDENT_AMBULATORY_CARE_PROVIDER_SITE_OTHER): Payer: Medicare Other | Admitting: Cardiology

## 2010-09-03 ENCOUNTER — Encounter: Payer: Self-pay | Admitting: Cardiology

## 2010-09-03 DIAGNOSIS — I10 Essential (primary) hypertension: Secondary | ICD-10-CM

## 2010-09-03 DIAGNOSIS — R52 Pain, unspecified: Secondary | ICD-10-CM

## 2010-09-03 DIAGNOSIS — I428 Other cardiomyopathies: Secondary | ICD-10-CM

## 2010-09-03 NOTE — Progress Notes (Signed)
HPI The patient presents for followup of her mixed heart failure. Unfortunately she is not feeling well. She reports that she hurts all over area she can't qualify or quantify this further. She won't localize it to joints or muscles. She says she hurts on the top of her head to the bottom of her feet. She complains of lumps on her scalp. She has been treated for "pink eye". She's not having any shortness of breath, PND or orthopnea. She's actually had no new swelling, weight gain or edema. She's not describing specifically chest pressure or other anginal equivalent. She's not having palpitations, presyncope or syncope.  Allergies  Allergen Reactions  . Latex   . Penicillins   . Tenivac     Current Outpatient Prescriptions  Medication Sig Dispense Refill  . aspirin 81 MG tablet Take 81 mg by mouth daily.        . bisacodyl (DULCOLAX) 5 MG EC tablet Take 5 mg by mouth daily as needed.        . carvedilol (COREG) 3.125 MG tablet Take 3.125 mg by mouth 2 (two) times daily with a meal.        . estrogens, conjugated, (PREMARIN) 0.625 MG tablet Take 0.625 mg by mouth daily. Take daily for 21 days then do not take for 7 days.       . ferrous fumarate (FERRETTS) 325 (106 FE) MG TABS Take by mouth 2 (two) times daily.        . furosemide (LASIX) 40 MG tablet Take 40 mg by mouth daily.        Marland Kitchen lisinopril (PRINIVIL,ZESTRIL) 10 MG tablet Take 10 mg by mouth daily.        . potassium chloride SA (K-DUR,KLOR-CON) 20 MEQ tablet Take 40 MEQ twice a day       . traMADol (ULTRAM) 50 MG tablet Take 50 mg by mouth every 6 (six) hours as needed.          Past Medical History  Diagnosis Date  . HTN (hypertension)   . Diverticulosis   . Hemorrhoid   . Osteoarthritis   . Cardiomyopathy   . UTI (lower urinary tract infection)   . H/O: hysterectomy     Past Surgical History  Procedure Date  . Wrist surgery     ROS:  Aches and pains, sinus trouble.  Otherwise as stated in the HPI and negative for all  other systems.  PHYSICAL EXAM BP 90/70  Pulse 84  Resp 20  Ht 5\' 1"  (1.549 m)  Wt 210 lb 8 oz (95.482 kg)  BMI 39.77 kg/m2 PHYSICAL EXAM GEN:  No distress NECK:  No jugular venous distention at 90 degrees, waveform within normal limits, carotid upstroke brisk and symmetric, no bruits, no thyromegaly, conjunctiva injected LYMPHATICS:  No cervical adenopathy LUNGS:  Clear to auscultation bilaterally BACK:  No CVA tenderness CHEST:  Unremarkable HEART:  S1 and S2 within normal limits, no S3, no S4, no clicks, no rubs, no murmurs ABD:  Positive bowel sounds normal in frequency in pitch, no bruits, no rebound, no guarding, unable to assess midline mass or bruit with the patient seated. EXT:  2 plus pulses throughout, moderate edema, no cyanosis no clubbing SKIN:  No rashes no nodules NEURO:  Cranial nerves II through XII grossly intact, motor grossly intact throughout PSYCH:  Cognitively intact, oriented to person place and time  EKG:  Sinus rhythm, left bundle branch block, left axis deviation  ASSESSMENT AND PLAN

## 2010-09-03 NOTE — Patient Instructions (Signed)
The current medical regimen is effective;  continue present plan and medications.  Follow up in 6 months with Dr Hochrein.  You will receive a letter in the mail 2 months before you are due.  Please call us when you receive this letter to schedule your follow up appointment.  

## 2010-09-03 NOTE — Assessment & Plan Note (Signed)
She seems to be euvolemic.  At this point, no change in therapy is indicated.  We have reviewed salt and fluid restrictions.  No further cardiovascular testing is indicated.   

## 2010-09-03 NOTE — Assessment & Plan Note (Signed)
Her blood pressure is actually low.  No change in therapy is indicated.

## 2010-09-03 NOTE — Assessment & Plan Note (Signed)
The patient describes vague pain.  However she is unable to localize it and has no objective findings. I told her she would need to present to her primary physician, urgent care or the emergency room if she remains uncomfortable. At this point I see no cardiac etiology or any complaints she doesn't appear to be in distress.

## 2010-09-04 ENCOUNTER — Other Ambulatory Visit: Payer: Self-pay | Admitting: Cardiology

## 2010-09-27 ENCOUNTER — Emergency Department (HOSPITAL_COMMUNITY): Payer: Medicare Other

## 2010-09-27 ENCOUNTER — Inpatient Hospital Stay (HOSPITAL_COMMUNITY)
Admission: EM | Admit: 2010-09-27 | Discharge: 2010-10-02 | DRG: 392 | Disposition: A | Discharge status: Home Health | Payer: Medicare Other | Attending: Internal Medicine | Admitting: Internal Medicine

## 2010-09-27 DIAGNOSIS — Z91040 Latex allergy status: Secondary | ICD-10-CM

## 2010-09-27 DIAGNOSIS — N72 Inflammatory disease of cervix uteri: Secondary | ICD-10-CM | POA: Diagnosis present

## 2010-09-27 DIAGNOSIS — Z8489 Family history of other specified conditions: Secondary | ICD-10-CM

## 2010-09-27 DIAGNOSIS — H15009 Unspecified scleritis, unspecified eye: Secondary | ICD-10-CM | POA: Diagnosis present

## 2010-09-27 DIAGNOSIS — K5900 Constipation, unspecified: Secondary | ICD-10-CM | POA: Diagnosis present

## 2010-09-27 DIAGNOSIS — I10 Essential (primary) hypertension: Secondary | ICD-10-CM | POA: Diagnosis present

## 2010-09-27 DIAGNOSIS — I509 Heart failure, unspecified: Secondary | ICD-10-CM | POA: Diagnosis present

## 2010-09-27 DIAGNOSIS — Z887 Allergy status to serum and vaccine status: Secondary | ICD-10-CM

## 2010-09-27 DIAGNOSIS — E871 Hypo-osmolality and hyponatremia: Secondary | ICD-10-CM | POA: Diagnosis present

## 2010-09-27 DIAGNOSIS — I5020 Unspecified systolic (congestive) heart failure: Secondary | ICD-10-CM | POA: Diagnosis present

## 2010-09-27 DIAGNOSIS — E878 Other disorders of electrolyte and fluid balance, not elsewhere classified: Secondary | ICD-10-CM | POA: Diagnosis present

## 2010-09-27 DIAGNOSIS — Z90710 Acquired absence of both cervix and uterus: Secondary | ICD-10-CM

## 2010-09-27 DIAGNOSIS — Z8719 Personal history of other diseases of the digestive system: Secondary | ICD-10-CM

## 2010-09-27 DIAGNOSIS — Z880 Allergy status to penicillin: Secondary | ICD-10-CM

## 2010-09-27 DIAGNOSIS — R109 Unspecified abdominal pain: Principal | ICD-10-CM | POA: Diagnosis present

## 2010-09-27 DIAGNOSIS — Z9079 Acquired absence of other genital organ(s): Secondary | ICD-10-CM

## 2010-09-27 DIAGNOSIS — K589 Irritable bowel syndrome without diarrhea: Secondary | ICD-10-CM | POA: Diagnosis present

## 2010-09-27 DIAGNOSIS — Z7982 Long term (current) use of aspirin: Secondary | ICD-10-CM

## 2010-09-27 DIAGNOSIS — I2510 Atherosclerotic heart disease of native coronary artery without angina pectoris: Secondary | ICD-10-CM | POA: Diagnosis present

## 2010-09-27 DIAGNOSIS — N952 Postmenopausal atrophic vaginitis: Secondary | ICD-10-CM | POA: Diagnosis present

## 2010-09-27 LAB — URINALYSIS, ROUTINE W REFLEX MICROSCOPIC
Glucose, UA: NEGATIVE mg/dL
Ketones, ur: NEGATIVE mg/dL
Leukocytes, UA: NEGATIVE
Specific Gravity, Urine: 1.015 (ref 1.005–1.030)
pH: 7 (ref 5.0–8.0)

## 2010-09-27 LAB — LIPASE, BLOOD: Lipase: 18 U/L (ref 11–59)

## 2010-09-27 LAB — CBC
Hemoglobin: 15.4 g/dL — ABNORMAL HIGH (ref 12.0–15.0)
Platelets: 303 10*3/uL (ref 150–400)
RBC: 5.05 MIL/uL (ref 3.87–5.11)

## 2010-09-27 LAB — COMPREHENSIVE METABOLIC PANEL
ALT: 34 U/L (ref 0–35)
Albumin: 3.8 g/dL (ref 3.5–5.2)
Alkaline Phosphatase: 127 U/L — ABNORMAL HIGH (ref 39–117)
BUN: 26 mg/dL — ABNORMAL HIGH (ref 6–23)
Calcium: 9.7 mg/dL (ref 8.4–10.5)
GFR calc Af Amer: 59 mL/min — ABNORMAL LOW (ref >60)
Potassium: 4.8 mEq/L (ref 3.5–5.1)
Sodium: 121 mEq/L — ABNORMAL LOW (ref 135–145)
Total Protein: 8 g/dL (ref 6.0–8.3)

## 2010-09-27 LAB — DIFFERENTIAL
Basophils Absolute: 0 10*3/uL (ref 0.0–0.1)
Eosinophils Relative: 0 % (ref 0–5)
Lymphocytes Relative: 6 % — ABNORMAL LOW (ref 12–46)
Monocytes Relative: 4 % (ref 3–12)
Neutrophils Relative %: 90 % — ABNORMAL HIGH (ref 43–77)
Smear Review: ADEQUATE

## 2010-09-27 LAB — POCT I-STAT TROPONIN I: Troponin i, poc: 0 ng/mL (ref 0.00–0.08)

## 2010-09-27 MED ORDER — IOHEXOL 300 MG/ML  SOLN: 100.0000 mL | Freq: Once | INTRAMUSCULAR | Status: AC | PRN

## 2010-09-28 ENCOUNTER — Inpatient Hospital Stay (HOSPITAL_COMMUNITY): Payer: Medicare Other

## 2010-09-28 LAB — MRSA PCR SCREENING: MRSA by PCR: NEGATIVE

## 2010-09-28 LAB — COMPREHENSIVE METABOLIC PANEL
Albumin: 3 g/dL — ABNORMAL LOW (ref 3.5–5.2)
CO2: 24 mEq/L (ref 19–32)
Chloride: 88 mEq/L — ABNORMAL LOW (ref 96–112)
Glucose, Bld: 97 mg/dL (ref 70–99)
Potassium: 3.9 mEq/L (ref 3.5–5.1)
Sodium: 121 mEq/L — ABNORMAL LOW (ref 135–145)

## 2010-09-28 LAB — CBC
Hemoglobin: 13.1 g/dL (ref 12.0–15.0)
MCH: 30.2 pg (ref 26.0–34.0)
Platelets: 397 10*3/uL (ref 150–400)
RBC: 4.34 MIL/uL (ref 3.87–5.11)
WBC: 20.8 10*3/uL — ABNORMAL HIGH (ref 4.0–10.5)

## 2010-09-28 LAB — SEDIMENTATION RATE: Sed Rate: 8 mm/hr (ref 0–22)

## 2010-09-28 LAB — OSMOLALITY: Osmolality: 259 mOsm/kg — ABNORMAL LOW (ref 275–300)

## 2010-09-28 LAB — DIFFERENTIAL
Basophils Relative: 0 % (ref 0–1)
Eosinophils Absolute: 0.1 10*3/uL (ref 0.0–0.7)
Monocytes Relative: 6 % (ref 3–12)
Neutro Abs: 17.7 10*3/uL — ABNORMAL HIGH (ref 1.7–7.7)
Neutrophils Relative %: 85 % — ABNORMAL HIGH (ref 43–77)

## 2010-09-28 LAB — C-REACTIVE PROTEIN: CRP: 1.3 mg/dL — ABNORMAL HIGH (ref <0.6)

## 2010-09-29 LAB — T3, FREE: T3, Free: 2 pg/mL — ABNORMAL LOW (ref 2.3–4.2)

## 2010-09-29 LAB — C3 COMPLEMENT: C3 Complement: 103 mg/dL (ref 90–180)

## 2010-09-29 LAB — CBC
Platelets: 376 10*3/uL (ref 150–400)
RDW: 13.8 % (ref 11.5–15.5)
WBC: 12.7 10*3/uL — ABNORMAL HIGH (ref 4.0–10.5)

## 2010-09-29 LAB — BASIC METABOLIC PANEL
Chloride: 91 mEq/L — ABNORMAL LOW (ref 96–112)
Creatinine, Ser: 0.78 mg/dL (ref 0.50–1.10)
GFR calc Af Amer: >60 mL/min (ref >60)
Potassium: 3.8 mEq/L (ref 3.5–5.1)

## 2010-09-29 LAB — T4, FREE: Free T4: 1.71 ng/dL (ref 0.80–1.80)

## 2010-09-29 LAB — ANA: Anti Nuclear Antibody(ANA): NEGATIVE

## 2010-09-30 ENCOUNTER — Inpatient Hospital Stay (HOSPITAL_COMMUNITY): Payer: Medicare Other

## 2010-09-30 LAB — COMPREHENSIVE METABOLIC PANEL WITH GFR
ALT: 17 U/L (ref 0–35)
AST: 10 U/L (ref 0–37)
Albumin: 2.8 g/dL — ABNORMAL LOW (ref 3.5–5.2)
Alkaline Phosphatase: 91 U/L (ref 39–117)
BUN: 14 mg/dL (ref 6–23)
CO2: 28 meq/L (ref 19–32)
Calcium: 8.7 mg/dL (ref 8.4–10.5)
Chloride: 92 meq/L — ABNORMAL LOW (ref 96–112)
Creatinine, Ser: 0.8 mg/dL (ref 0.50–1.10)
GFR calc Af Amer: >60 mL/min
GFR calc non Af Amer: >60 mL/min
Glucose, Bld: 86 mg/dL (ref 70–99)
Potassium: 3.6 meq/L (ref 3.5–5.1)
Sodium: 127 meq/L — ABNORMAL LOW (ref 135–145)
Total Bilirubin: 0.4 mg/dL (ref 0.3–1.2)
Total Protein: 6.1 g/dL (ref 6.0–8.3)

## 2010-09-30 LAB — CBC
HCT: 34.4 % — ABNORMAL LOW (ref 36.0–46.0)
Hemoglobin: 11.7 g/dL — ABNORMAL LOW (ref 12.0–15.0)
MCH: 30 pg (ref 26.0–34.0)
MCHC: 34 g/dL (ref 30.0–36.0)
MCV: 88.2 fL (ref 78.0–100.0)
Platelets: 188 K/uL (ref 150–400)
RBC: 3.9 MIL/uL (ref 3.87–5.11)
RDW: 14.1 % (ref 11.5–15.5)
WBC: 12.2 K/uL — ABNORMAL HIGH (ref 4.0–10.5)

## 2010-09-30 LAB — CA 125: CA 125: 7.4 U/mL (ref 0.0–30.2)

## 2010-09-30 LAB — DIFFERENTIAL
Basophils Relative: 0 % (ref 0–1)
Lymphs Abs: 2.2 10*3/uL (ref 0.7–4.0)
Monocytes Absolute: 0.8 10*3/uL (ref 0.1–1.0)
Monocytes Relative: 7 % (ref 3–12)
Neutro Abs: 9.2 10*3/uL — ABNORMAL HIGH (ref 1.7–7.7)

## 2010-09-30 LAB — CEA: CEA: 0.8 ng/mL (ref 0.0–5.0)

## 2010-09-30 LAB — MAGNESIUM: Magnesium: 2.2 mg/dL (ref 1.5–2.5)

## 2010-09-30 LAB — CANCER ANTIGEN 19-9: CA 19-9: 21.4 U/mL — ABNORMAL LOW

## 2010-10-01 LAB — COMPREHENSIVE METABOLIC PANEL
ALT: 15 U/L (ref 0–35)
Albumin: 2.7 g/dL — ABNORMAL LOW (ref 3.5–5.2)
Alkaline Phosphatase: 81 U/L (ref 39–117)
BUN: 12 mg/dL (ref 6–23)
Calcium: 8.6 mg/dL (ref 8.4–10.5)
GFR calc Af Amer: >60 mL/min (ref >60)
Glucose, Bld: 75 mg/dL (ref 70–99)
Potassium: 3.8 mEq/L (ref 3.5–5.1)
Sodium: 127 mEq/L — ABNORMAL LOW (ref 135–145)
Total Protein: 5.4 g/dL — ABNORMAL LOW (ref 6.0–8.3)

## 2010-10-01 LAB — CBC
MCH: 30.1 pg (ref 26.0–34.0)
MCHC: 34.6 g/dL (ref 30.0–36.0)
RDW: 13.9 % (ref 11.5–15.5)

## 2010-10-01 LAB — DIFFERENTIAL
Basophils Absolute: 0 10*3/uL (ref 0.0–0.1)
Basophils Relative: 0 % (ref 0–1)
Eosinophils Absolute: 0.1 10*3/uL (ref 0.0–0.7)
Eosinophils Relative: 1 % (ref 0–5)
Monocytes Absolute: 1 10*3/uL (ref 0.1–1.0)
Monocytes Relative: 8 % (ref 3–12)

## 2010-10-01 LAB — MAGNESIUM: Magnesium: 2 mg/dL (ref 1.5–2.5)

## 2010-10-02 LAB — COMPREHENSIVE METABOLIC PANEL
Alkaline Phosphatase: 83 U/L (ref 39–117)
BUN: 8 mg/dL (ref 6–23)
CO2: 28 mEq/L (ref 19–32)
Chloride: 94 mEq/L — ABNORMAL LOW (ref 96–112)
Creatinine, Ser: 0.54 mg/dL (ref 0.50–1.10)
GFR calc Af Amer: >60 mL/min (ref >60)
GFR calc non Af Amer: >60 mL/min (ref >60)
Glucose, Bld: 79 mg/dL (ref 70–99)
Potassium: 4.1 mEq/L (ref 3.5–5.1)
Total Bilirubin: 0.3 mg/dL (ref 0.3–1.2)

## 2010-10-02 LAB — DIFFERENTIAL
Basophils Absolute: 0 10*3/uL (ref 0.0–0.1)
Eosinophils Relative: 1 % (ref 0–5)
Lymphocytes Relative: 21 % (ref 12–46)
Lymphs Abs: 2.4 10*3/uL (ref 0.7–4.0)
Neutro Abs: 8.1 10*3/uL — ABNORMAL HIGH (ref 1.7–7.7)

## 2010-10-02 LAB — CBC
HCT: 28.8 % — ABNORMAL LOW (ref 36.0–46.0)
Hemoglobin: 9.9 g/dL — ABNORMAL LOW (ref 12.0–15.0)
MCV: 87.3 fL (ref 78.0–100.0)
WBC: 11.4 10*3/uL — ABNORMAL HIGH (ref 4.0–10.5)

## 2010-10-02 LAB — ANTI-NEUTROPHIL ANTIBODY

## 2010-10-02 LAB — MAGNESIUM: Magnesium: 2.2 mg/dL (ref 1.5–2.5)

## 2010-10-02 NOTE — Discharge Summary (Signed)
[name], [name]                 ACCOUNT NO.:  [id number]  MEDICAL RECORD NO.:  [id number]  LOCATION:  5148                         FACILITY:  [facility]  PHYSICIAN:  Talmage Nap, MD  DATE OF BIRTH:  1934/10/28  DATE OF ADMISSION:  09/27/2010 DATE OF DISCHARGE:  10/02/2010                        DISCHARGE SUMMARY - REFERRING   PRIMARY CARE PHYSICIAN:  Gretta Arab. Valentina Lucks, MD at [facility].  PRIMARY CARDIOLOGIST:  Rollene Rotunda, MD, [facility].  PRIMARY OPHTHALMOLOGIST:  Dr. Maple Hudson.  RHEUMATOLOGIST:  Zenovia Jordan, MD.  CONSULTANT:  Involved in the case is GI, Dr. Ewing Schlein.  DISCHARGE DIAGNOSES: 1. Recurrent abdominal pain -- workup negative so far, questionable     irritable bowel syndrome. 2. Vagina ulcer, questionable atrophic vaginitis. 3. ?Episcleritis 3. Hyponatremia. 4. Hypertension. 5. History of coronary artery disease. 6. History of congestive heart failure (systolic dysfunction with an     EF of 45%). 7. History of diverticulosis.  HISTORY:  The patient is a 76 year old African-American female with history of coronary artery disease/CHF systolic dysfunction with an EF of 45%, was admitted to the hospital on September 27, 2010 by Dr. Carlota Raspberry with recurrent episodes of abdominal pain for couple of weeks and this was said to be getting progressively worse.  Abdominal pain was said to be generalized with occasional constipation.  The patient was said to be nauseated, but denied any vomiting.  She denied any fever. She denied any chills.  She denied any rigor and subsequently was brought to the hospital by her daughter for evaluation.  MEDICATIONS:  Her preadmission meds include, 1. Lasix 40 mg p.o. daily. 2. Iron one tablet p.o. daily. 3. Tramadol 50 mg p.o. t.i.d. p.r.n. 4. Senna tablet one p.o. daily. 5. Premarin vagina cream. 6. Potassium chloride 10 mEq four times daily. 7. MiraLax 17 grams p.o. daily at bedtime. 8. Lisinopril 10 mg one p.o.  daily. 9. Carvedilol 3.15 mg one p.o. daily.  ALLERGIES:  TO PENICILLIN, TETANUS TOXOID, AND LATEX.  PAST SURGICAL HISTORY:  Uterine prolapse with cystocele status post total abdominal hysterectomy with bilateral salpingo-oophorectomy.  SOCIAL HISTORY:  Negative for alcohol and tobacco use.  The patient is said to be living by herself.  Has a son and a daughter and another son who just passed away about a week prior to this admission from complication of seizure.  Since the patient has been believed, her affect has been said to be flat.  Prior to this admission, the patient is fairly independent, unable to carry out her activities of daily living.  FAMILY HISTORY:  York Spaniel to be positive for rheumatoid arthritis and also coronary artery disease.  REVIEW OF SYSTEMS:  Essentially are documented in the initial history and physical.  PHYSICAL EXAMINATION:  At this time, the patient was seen by the admitting physician. VITAL SIGNS:  T-max 98.1, blood pressure is 164/73, pulse was 89, the patient was saturating 98% on room air. HEENT:  Pupils are reactive to light and extraocular muscles are intact. She was said to have a episcleritis according to the admitting physician. NECK:  No lymphadenopathy and no thyromegaly. CHEST:  Said to be clear to auscultation. HEART:  Sounds are 1  and 2. ABDOMEN:  Obese with diffuse generalized tenderness.  No guarding.  No rigidity.  Bowel sounds are positive. EXTREMITIES:  Showed no pedal edema. NEUROLOGIC:  Nonfocal. MUSCULOSKELETAL SYSTEM:  Showed arthritic changes in the knees and feet. GENITAL:  By the admitting physician showed 1-2 cm ulcer at the labia majora. NEUROLOGIC:  Nonfocal.  LABORATORY DATA:  Initial first set of cardiac markers, troponin-I 0.00. Complete blood count with differential showed WBC of 21.7, hemoglobin of 15.4, hematocrit of 43.5, MCV 86.1 with a platelet count of 303, neutrophils 90%, and absolute neutrophil count is  19.5.  Lactic acid level is 1.6.  Comprehensive metabolic panel showed sodium of 121, potassium of 4.8, chloride of 85 with a bicarb of 23, glucose is 122, BUN is 26, creatinine is 1.09.  LFTs showed total bilirubin 0.4, alkaline phosphatase is 127, AST 21, ALT 34, lipase 918.  ProBNP is 2937.  Routine MRSA screening negative.  A repeat complete blood count with differential done on October 06, 2010, showed WBC of 20.8, hemoglobin of 13.1, hematocrit 37.4, MCV of 86.2 with a platelet count of 397.  A repeat comprehensive metabolic panel has done showed sodium of 121, potassium of 3.9, chloride of 80 with a bicarb of 24, glucose is 97, BUN is 18, creatinine is 0.81.  ESR 8, C-reactive protein is 1.3. Rheumatoid factor less than 10, unremarkable.  C3 complement 103 normal. C4  complement 29 normal.  Blood culture x2 no growth.  ANA negative. TSH 2.161 normal.  T4 of 1.71 normal.  T3 of 2.0 slightly low.  RPR nonreactive.  HIV test nonreactive.  CA-125, 7.4 normal.  CEA level was 0.8 normal.  CA19-9, 21.4 low.  P-ANCA positive 1.64, this was done at an outside lab.  A repeat complete blood count with differential done on October 02, 2010, showed WBC of 11.4, hemoglobin of 9.9, hematocrit of 28.8, MCV of 87.2 with a platelet count of 287, neutrophils 71, and absolute neutrophil count is 8.1.  Comprehensive metabolic panel showed sodium of 127, potassium of 4.1, chloride of 94 with a bicarb of 28, glucose is 79, BUN is 8, creatinine 0.54, magnesium level is 2.2. Imaging studies done on the patient include acute abdominal series unremarkable.  CT of the abdomen and pelvis x2 did not show any abdominal or pelvic abnormality and a chest x-ray showed the lungs are clear without any focal infiltrates.  HOSPITAL COURSE:  The patient was admitted to general medical floor with an impression of abdominal pain, scleritis, and leukocytosis.  She was given normal saline IV 1 L to go by 1 hour and pain  control was done with Tylenol and Dilaudid.  The patient was empirically started on schedule dose of prednisone starting with 60 mg p.o. daily.  Her nausea was controlled with Zofran and since the patient is said to be chronically constipated, she was given MiraLax, Colace, and Fleet Enemas and thereafter the patient was able to move bowels.  At that time, the patient was seen by me for the very first time, which was on September 29, 2010.  She was started on normal saline IV to go at rate of 60 mL an hour and also given Dilaudid 2 mg IV q. 4 p.r.n. for pain and also given enema to be able to move her bowel.  She was empirically started on Flagyl 500 mg IV daily trial and Cipro 500 mg IV q.12 h.  Because of the ulcers in the labia majora, the patient was  started on estrogen cream. She was subsequently followed and evaluated by me on a daily basis.  On October 01, 2010, Flagyl, Cipro, as well as Dilaudid were discontinued and the patient was started on Levsin 0.125 mg p.o. t.i.d.  GI was consulted, Dr. Ewing Schlein, which I had an extensive discussion particularly because of the positive P-ANCA.  He recommended that the patient should be continued on the present regimen and she will have colonoscopy done as an outpatient.  The patient, however, had labile blood pressure and blood pressure was maintained with lisinopril 20 mg p.o. daily and Norvasc 10 mg p.o. daily was added to regimen.  Because of her chronic hyponatremia, she was given demeclocycline 150 mg p.o. b.i.d.  So far, the patient has remained clinically stable.  She complained of vague abdominal discomfort, which is generalized and examination of the patient had been unremarkable.  I had an extensive discussion with the patient's Dr. [name] with a phone number of [phone] explaining what has been done for her mom and she verbalized understanding.  So the plan today is for the patient to be discharged home today on activity  as tolerated.  She will be on semisolid diet and this should be slowly transitioned to cardiac prudent diet.  She will follow up with her primary care physician in 1-2 weeks and her rheumatologist, Dr. Zenovia Jordan on October 05, 2010.  Medication to be taken at home include the following, 1. Amlodipine 10 mg p.o. daily. 2. Dulcolax 10 mg rectally daily p.r.n. 3. Carafate 1 gram p.o. t.i.d. with meals. 4. Demeclocycline 150 mg p.o. b.i.d. 5. Estrogen cream applied vaginally twice a day. 6. Levsin, hyoscyamine 0.125 mg one p.o. t.i.d. 7. Zofran 4 mg IV q. 6 p.r.n. 8. Polyethylene glycol 3350 powder. 9. MiraLax 17 grams p.o. daily. 10.Protonix 40 mg p.o. daily. 11.Aspirin 81 mg one p.o. daily. 12.Coreg (carvedilol 3.125 mg one p.o. daily). 13.Furosemide 40 mg one p.o. daily. 14.Iron over-the-counter one p.o. daily. 15.Lisinopril 10 mg p.o. daily. 16.Potassium chloride 10 mEq one pill four times a daily. 17.Senna over-the-counter one p.o. daily. 18.Tramadol 50 mg one p.o. three times a day p.r.n. 19.Predisone 50mg  po,taper dose by 10mg  p.o daily  Finally, the patient will also have Home Health PT.     Talmage Nap, MD     CN/MEDQ  D:  10/02/2010  T:  10/02/2010  Job:  [id number]  cc:   Gretta Arab. Valentina Lucks, M.D.  Electronically Signed by Talmage Nap  on 10/02/2010 06:08:42 PM

## 2010-10-04 ENCOUNTER — Other Ambulatory Visit: Payer: Self-pay | Admitting: Cardiology

## 2010-10-04 LAB — CULTURE, BLOOD (ROUTINE X 2): Culture: NO GROWTH

## 2010-11-04 LAB — POCT URINALYSIS DIP (DEVICE)
Bilirubin Urine: NEGATIVE
Ketones, ur: NEGATIVE
Nitrite: NEGATIVE
Operator id: 282151
Protein, ur: NEGATIVE
pH: 7

## 2010-11-04 LAB — POCT I-STAT, CHEM 8
Calcium, Ion: 0.95 — ABNORMAL LOW
Chloride: 102
HCT: 48 — ABNORMAL HIGH
Sodium: 137
TCO2: 24

## 2010-11-11 NOTE — H&P (Signed)
[name], [name]                 ACCOUNT NO.:  [id number]  MEDICAL RECORD NO.:  [id number]  LOCATION:                                 FACILITY:  PHYSICIAN:  Carlota Raspberry, MD         DATE OF BIRTH:  Sep 15, 1934  DATE OF ADMISSION: DATE OF DISCHARGE:                             HISTORY & PHYSICAL   PRIMARY CARE DOCTOR:  Gretta Arab. Valentina Lucks, MD, at [facility] at the [facility].  CARDIOLOGIST:  Rollene Rotunda, MD, [facility]  OPHTHALMOLOGIST:  Dr. Serafina Royals.  CHIEF COMPLAINT:  Abdominal pain.  HISTORY OF PRESENT ILLNESS:  This is a 76 year old female with a history of CAD/systolic and diastolic CHF with (EF 45%), hypertension, chronic constipation, prolapsed bladder who presents for evaluation of abdominal pain over the past couple of weeks that has been slowly getting worse in intensity.  The patient initially describes her pain as "all over" from the "top of her head to the soles of her feet" and is initially quite nonspecific inher complaints, however, when further pressed is able to state that in addition to being in pain all over her body, her abdomen is also quite painful.  Her daughter, [name], is at the bedside and is able to help give more specific history and says that her problems with her abdomen have been coming and going and review of the records and confirmation with [name] indicates that she has been previously evaluated for abdominal pain that has previously been thought to be due to constipation; however, she has not had this problem for the past 2 years.  However, in the past couple of weeks, her bowel movements have gone down from usually two to three per day to now one per day, and she has not had a bowel movement today.  The patient can also endorse that she is passing gas more seldomly and that her abdomen feels a little bit more distended.  This is all despite having continued to take her daily laxatives.  However, the patient and her daughter also endorse  that she has been feeling much more weak, having decreased appetite, and just in general feeling more ill and more malaise such that her daughter today found her mother lying down and being very weak and unable to get up and so she brought her to the hospital for further evaluation.  On further history, the daughter also states that over the last couple of weeks, the patient was referred by her PCP, Dr. Maurice Small to Ophthalmology, Dr. Serafina Royals, for what sounds like bilateral very red eyes and was given a prescription drops and overall they have improved but are still quite red.  The daughter states that between her PCP and Dr. Serafina Royals, they wanted her referred to Rheumatology and this was for diffuse aches and pains that she has been having throughout her body over the past couple of weeks; however, she has not been able to see rheumatology yet.  Her symptoms are as above, otherwise, positive for weight loss from 204 to 195, some dry heaving, rib pain, and some "sores" in her genital area.  However, review of systems is negative for fevers, chills,  night sweats, shortness of breath, chest pain, volume overload, HEENT problems, mouth ulcers, rashes or other skin changes, psychiatric changes, or other focal neurological deficits.  PAST MEDICAL HISTORY:  Positive for: 1. CAD/mixed systolic and diastolic CHF with EF 45%. 2. Prolapsed bladder, uterine prolapse, and cystocele status post     TVH/BSO. 3. Chronic constipation, previously recommended to see Dr. Jarold Motto     at [facility], unclear if she has seen him though. 4. Hypertension. 5. Diverticulosis. 6. Chronic anemia. 7. Hypertension. 8. Hypokalemia.  HOME MEDICATIONS:  Reconciled by the pharmacy tech and include: 1. Lasix 40 mg orally daily. 2. Iron 1 tablet daily. 3. Tramadol 50 mg three times a day as needed. 4. Senna 1 tablet daily. 5. Premarin vaginal cream. 6. Potassium chloride 10 mEq four times daily. 7. MiraLax 17  g daily at bedtime. 8. Lisinopril 10 mg 1 tablet daily. 9. Carvedilol 3.125 daily.  ALLERGIES:  PENICILLIN which causes a rash, TETANUS TOXOID - rash, and LATEX - rash.  SOCIAL HISTORY:  She lives alone and has a son and a daughter and another son who just passed away last week, apparently from epilepsy. She has been in a low mood since then.  Also notable is that her sister died earlier this year.  She is a never smoker but continues to use some snuff.  She denies any drugs.  She is fairly independent with her ADLs.  FAMILY HISTORY:  Prominently, she notes that everyone in her family has "rheumatism" and it sounds like they have rheumatoid arthritis but she is not very specific about this.  Her brother had a CABG.  Her son passed away recently from epilepsy.  PHYSICAL EXAMINATION:  VITAL SIGNS:  T-max 98.1; blood pressure 164/73, ranging 99-162 over the 60s to 70s; pulse 89, ranging from the 60s to 90s; and 98% on room air. GENERAL:  She is a fairly large lady lying in the ED triaged bed.  She is occasionally moaning in pain.  She does not appear acutely distressed but does not appear very comfortable either.  She is a very nonspecific historian and most of her history is gathered from her daughter at the bedside. HEENT:  Her bilateral sclerae are very grossly engorged and red.  She looks to have episcleritis versus scleritis.  The right is worse than the left, but her pupils are equal and round and reactive to light.  Her mouth is moist and her dentition is poor, but there are no gross oral lesions of note. NECK:  Supple and I am unable to palpate any cervical or supraclavicular or axillary lymphadenopathy. LUNGS:  Clear to auscultation bilaterally.  There are no wheezes, crackles, or rales. HEART:  Regular rate and rhythm without any murmurs or gallops. ABDOMEN:  Quite obese but it is soft and diffusely tender in no particular area.  She appears to moan and groan any time I  touch her abdomen, but she does not exhibit any peritoneal signs.  I do not feel any frank hepatosplenomegaly. EXTREMITIES:  Warm and well perfused.  She is not diaphoretic.  She does not appear to have any gross pitting edema. Joint exam shows no evidence of any changes consistent with rheumatoid arthritis in her hands.  Examination of her PIPs, DIPs, wrists, elbows and shoulders, knees, and ankles show no evidence of swelling, effusions, erythema, or warmth, i.e., she has no signs of joint inflammatory process. GENITAL:  She appears to have a shallow 1- to 2-cm ulcer in the  lower right aspect of her labia majora and potentially has also some shallow ulcers in her introitus as well. NEUROLOGIC:  She appears minimally conversant, but is alert when awoken. She was able to answer questions correctly.  She is moving all of her extremities spontaneously and does not have any focal neurological deficit grossly.  LABORATORY DATA:  Significant for hyponatremia and hypochloremia at 121 and 85; however, her BUN and creatinine are at baseline at 26 and 1.09, glucose is 122.  Her CBC is significant for white count of 21.7 with 90% neutrophils with toxic granulations.  There were no band forms, otherwise differential was normal. Hematocrit is stable at 43.5 and platelets are normal as well. BNP is 2937.  Lipase is 18.  LFTs are grossly normal except for an alk phos that is minimally elevated at 127.  Her lactate is 1.6.  Her troponin is negative x1.  Her UA is negative, and she does not show any evidence of hematuria, infection, or proteinuria.  IMAGING:  She had a CT abdomen and pelvis done in the emergency room without contrast and shows no acute abdominal or pelvic abnormalities but does show evidence of low-density structures in both kidneys, probably related to cysts.  I personally reviewed previous CT abdomen going back a few years and these have been present and have been previously  called as benign-appearing structures as well.  IMPRESSION:  This is a 76 year old female with a history of coronary artery disease/mixed systolic and diastolic congestive heart failure with a last ejection fraction of 45%, chronic abdominal pain, previously worked up and thought to be due to constipation, prolapsed bladder and uterus status post total vaginal hysterectomy/bilateral salpingo- oophorectomy who presents with diffuse abdominal pain and diffuse musculoskeletal pain and found to have a leukocytosis to 21, a dense bilateral episcleritis versus scleritis and a right lower labial vaginal ulcer.  The abdominal pain could well by history be due to constipation as she has been having decreased bowel movements and some decreased flatus. She does not have any signs of outlet obstruction on her imaging. However, the more important process going on is what appears to be a rheumatological process with her scleritis, leukocytosis, diffuse aches, and a vaginal ulcer.  This brings to mind are the spondyloarthropathies but also a potential for systemic lupus erythematosus or a Behcet disease.  Interestingly, she notes that her family has a history of rheumatologic disease but is not very specific.  I think less likely will be rheumatoid arthritis and also even the vasculitides.  1. Leukocytosis, scleritis, and vaginal ulcer.  We will get an ANA and     ANCA, RF, ESR, CRP, C3 and C4.  I have considered getting anti-     double stranded DNA, Ro, La, and anti-Smith but we will defer on     these for now.  Get a PA and lateral chest x-ray in the morning.     We will get blood cultures x2, but hold off on urine culture given     the gross negativity of her UA.  We will trend her CBC and diff.     No empiric antibiotics at this point pending culture data and also     given her clinical stability.  We will also consult Rheumatology in     the morning. 2. Abdominal pain.  This could very well be  due to her constipation,     and we will give her an aggressive bowel regimen overnight. 3. History  of hypertension.  This is likely due to her being in pain     and for now we will continue her home Coreg and lisinopril. 4. Hyponatremia, hypochloremia.  Review of her previous lab values     shows that she runs on the lower end of the spectrum for sodium and     this may be due to decreased p.o. intake.  We will give her a liter     of normal saline and continue to trend it out. 5. Fluids, electrolytes, and nutrition.  One liter NS as above.  She     has a one peripheral in her right basilic vein that was a very     difficult stick, and I have instructed nursing to be fairly     cautious while giving her fluids so as to not blow out this single     peripheral.  She can be on a regular heart-healthy diet.  Peripheral access as above.  CONTACT INFORMATION:  [name], her daughter, at [phone] and Janae Sauce, her son, at [phone].  CODE STATUS:  She is a full code as discussed with the patient and her daughter.          ______________________________ Carlota Raspberry, MD     EB/MEDQ  D:  09/28/2010  T:  09/28/2010  Job:  [id number]  Electronically Signed by Carlota Raspberry MD on 11/11/2010 12:05:23 PM

## 2010-11-13 LAB — DIFFERENTIAL
Eosinophils Relative: 2 % (ref 0–5)
Lymphocytes Relative: 25 % (ref 12–46)
Lymphs Abs: 1.8 10*3/uL (ref 0.7–4.0)
Monocytes Absolute: 0.2 10*3/uL (ref 0.1–1.0)
Neutro Abs: 4.8 10*3/uL (ref 1.7–7.7)

## 2010-11-13 LAB — CBC
Hemoglobin: 12.2 g/dL (ref 12.0–15.0)
MCV: 92.8 fL (ref 78.0–100.0)
Platelets: 403 10*3/uL — ABNORMAL HIGH (ref 150–400)
RBC: 3.98 MIL/uL (ref 3.87–5.11)
RDW: 14.1 % (ref 11.5–15.5)
WBC: 7 10*3/uL (ref 4.0–10.5)

## 2010-11-13 LAB — URINALYSIS, ROUTINE W REFLEX MICROSCOPIC
Ketones, ur: NEGATIVE mg/dL
Nitrite: NEGATIVE
Protein, ur: NEGATIVE mg/dL

## 2010-11-13 LAB — COMPREHENSIVE METABOLIC PANEL
AST: 12 U/L (ref 0–37)
Albumin: 3.5 g/dL (ref 3.5–5.2)
Calcium: 9.1 mg/dL (ref 8.4–10.5)
Creatinine, Ser: 0.74 mg/dL (ref 0.4–1.2)
GFR calc Af Amer: >60 mL/min (ref >60)
GFR calc non Af Amer: >60 mL/min (ref >60)

## 2010-11-15 ENCOUNTER — Other Ambulatory Visit: Payer: Self-pay | Admitting: Cardiology

## 2010-11-17 LAB — COMPREHENSIVE METABOLIC PANEL
Albumin: 3.5
Alkaline Phosphatase: 116
BUN: 6
Chloride: 107
Creatinine, Ser: 0.79
GFR calc non Af Amer: >60
Glucose, Bld: 135 — ABNORMAL HIGH
Total Bilirubin: 0.3

## 2010-11-17 LAB — TYPE AND SCREEN
ABO/RH(D): O POS
Antibody Screen: NEGATIVE

## 2010-11-17 LAB — CBC
HCT: 39.1
Hemoglobin: 13
MCHC: 34
MCV: 94.1
MCV: 94.1
Platelets: 338
Platelets: 411 — ABNORMAL HIGH
RBC: 3.43 — ABNORMAL LOW
RDW: 15
WBC: 6.5

## 2010-11-27 ENCOUNTER — Other Ambulatory Visit: Payer: Self-pay | Admitting: Cardiology

## 2010-12-17 ENCOUNTER — Encounter: Payer: Self-pay | Admitting: Cardiology

## 2010-12-24 ENCOUNTER — Encounter (HOSPITAL_COMMUNITY): Payer: Self-pay | Admitting: *Deleted

## 2010-12-24 ENCOUNTER — Inpatient Hospital Stay (HOSPITAL_COMMUNITY)
Admission: EM | Admit: 2010-12-24 | Discharge: 2011-01-12 | DRG: 378 | Disposition: A | Discharge status: Home Health | Payer: Medicare Other | Source: Ambulatory Visit | Attending: Internal Medicine | Admitting: Internal Medicine

## 2010-12-24 ENCOUNTER — Other Ambulatory Visit: Payer: Self-pay | Admitting: Dermatology

## 2010-12-24 ENCOUNTER — Other Ambulatory Visit: Payer: Self-pay

## 2010-12-24 ENCOUNTER — Emergency Department (HOSPITAL_COMMUNITY): Payer: Medicare Other

## 2010-12-24 DIAGNOSIS — I428 Other cardiomyopathies: Secondary | ICD-10-CM | POA: Diagnosis present

## 2010-12-24 DIAGNOSIS — R1310 Dysphagia, unspecified: Secondary | ICD-10-CM | POA: Diagnosis not present

## 2010-12-24 DIAGNOSIS — K625 Hemorrhage of anus and rectum: Principal | ICD-10-CM | POA: Diagnosis present

## 2010-12-24 DIAGNOSIS — I110 Hypertensive heart disease with heart failure: Secondary | ICD-10-CM | POA: Diagnosis present

## 2010-12-24 DIAGNOSIS — D649 Anemia, unspecified: Secondary | ICD-10-CM | POA: Diagnosis present

## 2010-12-24 DIAGNOSIS — R531 Weakness: Secondary | ICD-10-CM

## 2010-12-24 DIAGNOSIS — D473 Essential (hemorrhagic) thrombocythemia: Secondary | ICD-10-CM | POA: Diagnosis present

## 2010-12-24 DIAGNOSIS — R109 Unspecified abdominal pain: Secondary | ICD-10-CM | POA: Diagnosis present

## 2010-12-24 DIAGNOSIS — K921 Melena: Secondary | ICD-10-CM | POA: Diagnosis present

## 2010-12-24 DIAGNOSIS — I10 Essential (primary) hypertension: Secondary | ICD-10-CM

## 2010-12-24 DIAGNOSIS — K5730 Diverticulosis of large intestine without perforation or abscess without bleeding: Secondary | ICD-10-CM | POA: Diagnosis present

## 2010-12-24 DIAGNOSIS — E876 Hypokalemia: Secondary | ICD-10-CM | POA: Diagnosis present

## 2010-12-24 DIAGNOSIS — K644 Residual hemorrhoidal skin tags: Secondary | ICD-10-CM | POA: Diagnosis present

## 2010-12-24 DIAGNOSIS — R609 Edema, unspecified: Secondary | ICD-10-CM | POA: Diagnosis not present

## 2010-12-24 DIAGNOSIS — R21 Rash and other nonspecific skin eruption: Secondary | ICD-10-CM | POA: Diagnosis not present

## 2010-12-24 DIAGNOSIS — K5900 Constipation, unspecified: Secondary | ICD-10-CM | POA: Diagnosis present

## 2010-12-24 DIAGNOSIS — Z91040 Latex allergy status: Secondary | ICD-10-CM

## 2010-12-24 DIAGNOSIS — I509 Heart failure, unspecified: Secondary | ICD-10-CM | POA: Diagnosis present

## 2010-12-24 DIAGNOSIS — R059 Cough, unspecified: Secondary | ICD-10-CM | POA: Diagnosis not present

## 2010-12-24 DIAGNOSIS — K121 Other forms of stomatitis: Secondary | ICD-10-CM | POA: Diagnosis not present

## 2010-12-24 DIAGNOSIS — Z880 Allergy status to penicillin: Secondary | ICD-10-CM

## 2010-12-24 DIAGNOSIS — R1013 Epigastric pain: Secondary | ICD-10-CM | POA: Diagnosis present

## 2010-12-24 DIAGNOSIS — E860 Dehydration: Secondary | ICD-10-CM | POA: Diagnosis present

## 2010-12-24 DIAGNOSIS — E871 Hypo-osmolality and hyponatremia: Secondary | ICD-10-CM | POA: Diagnosis present

## 2010-12-24 DIAGNOSIS — R600 Localized edema: Secondary | ICD-10-CM | POA: Diagnosis not present

## 2010-12-24 DIAGNOSIS — N8110 Cystocele, unspecified: Secondary | ICD-10-CM

## 2010-12-24 DIAGNOSIS — D709 Neutropenia, unspecified: Secondary | ICD-10-CM | POA: Diagnosis present

## 2010-12-24 DIAGNOSIS — D72819 Decreased white blood cell count, unspecified: Secondary | ICD-10-CM | POA: Diagnosis not present

## 2010-12-24 DIAGNOSIS — K5909 Other constipation: Secondary | ICD-10-CM | POA: Diagnosis present

## 2010-12-24 DIAGNOSIS — G8929 Other chronic pain: Secondary | ICD-10-CM | POA: Diagnosis present

## 2010-12-24 DIAGNOSIS — IMO0001 Reserved for inherently not codable concepts without codable children: Secondary | ICD-10-CM | POA: Diagnosis present

## 2010-12-24 DIAGNOSIS — I059 Rheumatic mitral valve disease, unspecified: Secondary | ICD-10-CM | POA: Diagnosis present

## 2010-12-24 DIAGNOSIS — R05 Cough: Secondary | ICD-10-CM | POA: Diagnosis not present

## 2010-12-24 DIAGNOSIS — I5042 Chronic combined systolic (congestive) and diastolic (congestive) heart failure: Secondary | ICD-10-CM | POA: Diagnosis present

## 2010-12-24 DIAGNOSIS — D72829 Elevated white blood cell count, unspecified: Secondary | ICD-10-CM | POA: Diagnosis present

## 2010-12-24 HISTORY — DX: Cystocele, unspecified: N81.10

## 2010-12-24 HISTORY — DX: Obesity, unspecified: E66.9

## 2010-12-24 HISTORY — DX: Other constipation: K59.09

## 2010-12-24 HISTORY — DX: Chronic combined systolic (congestive) and diastolic (congestive) heart failure: I50.42

## 2010-12-24 LAB — COMPREHENSIVE METABOLIC PANEL
ALT: 7 U/L (ref 0–35)
AST: 12 U/L (ref 0–37)
Albumin: 3.2 g/dL — ABNORMAL LOW (ref 3.5–5.2)
CO2: 23 mEq/L (ref 19–32)
Chloride: 96 mEq/L (ref 96–112)
Creatinine, Ser: 1.06 mg/dL (ref 0.50–1.10)
GFR calc non Af Amer: 50 mL/min — ABNORMAL LOW (ref >90)
Potassium: 4.3 mEq/L (ref 3.5–5.1)
Sodium: 129 mEq/L — ABNORMAL LOW (ref 135–145)
Total Bilirubin: 0.2 mg/dL — ABNORMAL LOW (ref 0.3–1.2)

## 2010-12-24 LAB — CBC
Platelets: 520 10*3/uL — ABNORMAL HIGH (ref 150–400)
RBC: 3.76 MIL/uL — ABNORMAL LOW (ref 3.87–5.11)
RDW: 13.4 % (ref 11.5–15.5)
WBC: 6.4 10*3/uL (ref 4.0–10.5)

## 2010-12-24 MED ORDER — MORPHINE SULFATE 4 MG/ML IJ SOLN
4.0000 mg | Freq: Once | INTRAMUSCULAR | Status: AC
  Administered 2010-12-24: 4 mg via INTRAVENOUS
  Filled 2010-12-24: qty 1

## 2010-12-24 MED ORDER — ONDANSETRON HCL 4 MG/2ML IJ SOLN
4.0000 mg | Freq: Once | INTRAMUSCULAR | Status: AC
  Administered 2010-12-24: 4 mg via INTRAVENOUS
  Filled 2010-12-24: qty 2

## 2010-12-24 NOTE — ED Provider Notes (Signed)
History     CSN: [id number] Arrival date & time: 12/24/2010  8:11 PM   First MD Initiated Contact with Patient 12/24/10 2132      Chief Complaint  Patient presents with  . Rectal Bleeding    (Consider location/radiation/quality/duration/timing/severity/associated sxs/prior treatment) Patient is a 76 y.o. female presenting with hematochezia. The history is provided by the patient.  Rectal Bleeding  The current episode started today. The pain is moderate. The stool is described as mixed with blood. Associated symptoms include abdominal pain, rectal pain and coughing. Pertinent negatives include no fever, no diarrhea, no hematemesis, no nausea, no vomiting, no chest pain and no difficulty breathing.  Pt states she has pain "everywhere, and is swelling." Reports cough for the last two days. Today developed lower abdominal pain. Had two bowel movements with large amount of bright red blood each time. Denies fever, admits to chills. Normal stool consistency.  Pt also reports cough onset yesterday. States saw some blood when coughing today.   Past Medical History  Diagnosis Date  . HTN (hypertension)   . Diverticulosis   . Hemorrhoid   . Osteoarthritis   . Cardiomyopathy   . UTI (lower urinary tract infection)   . H/O: hysterectomy     Past Surgical History  Procedure Date  . Wrist surgery     Family History  Problem Relation Age of Onset  . Diabetes    . Hypertension    . Leukemia      History  Substance Use Topics  . Smoking status: Never Smoker   . Smokeless tobacco: Not on file  . Alcohol Use: No    OB History    Grav Para Term Preterm Abortions TAB SAB Ect Mult Living                  Review of Systems  Constitutional: Positive for chills and fatigue. Negative for fever.  HENT: Negative for ear pain, congestion, sore throat and neck pain.   Eyes: Negative.   Respiratory: Positive for cough, shortness of breath and wheezing. Negative for chest tightness.     Cardiovascular: Positive for leg swelling. Negative for chest pain and palpitations.  Gastrointestinal: Positive for abdominal pain, blood in stool, hematochezia, anal bleeding and rectal pain. Negative for nausea, vomiting, diarrhea and hematemesis.  Genitourinary: Negative for dysuria and decreased urine volume.  Musculoskeletal: Negative.   Skin: Negative.   Neurological: Negative.   Hematological: Negative.   Psychiatric/Behavioral: Negative.     Allergies  Latex; Penicillins; and Tenivac  Home Medications   Current Outpatient Rx  Name Route Sig Dispense Refill  . ASPIRIN 81 MG PO TABS Oral Take 81 mg by mouth daily.      Marland Kitchen BISACODYL 5 MG PO TBEC Oral Take 5 mg by mouth daily. For    . CARVEDILOL 3.125 MG PO TABS       . ESTROGENS, CONJUGATED 0.625 MG/GM VA CREA Vaginal Place 0.5 g vaginally daily.      Marland Kitchen FERROUS FUMARATE 325 (106 FE) MG PO TABS Oral Take by mouth 2 (two) times daily.      . FUROSEMIDE 40 MG PO TABS Oral Take 40 mg by mouth daily.      Marland Kitchen KLOR-CON M20 20 MEQ PO TBCR  take 2 tablets by mouth twice a day 120 tablet 6  . LISINOPRIL 10 MG PO TABS  take 1 tablet by mouth once daily 30 tablet 7  . TRAMADOL HCL 50 MG PO TABS Oral Take  50 mg by mouth every 6 (six) hours as needed. For pain      BP 131/73  Pulse 80  Temp(Src) 98.6 F (37 C) (Oral)  Resp 24  SpO2 97%  Physical Exam  Constitutional: She is oriented to person, place, and time. She appears well-developed and well-nourished.       Uncomfortable appearing, obese  HENT:  Head: Normocephalic and atraumatic.  Eyes: Conjunctivae are normal. Pupils are equal, round, and reactive to light.  Neck: Neck supple.  Cardiovascular: Normal rate, regular rhythm and normal heart sounds.   Pulmonary/Chest: Effort normal. No respiratory distress. She has no wheezes. She has no rales.       Coughing, decreased air movement bilaterally  Abdominal: Soft. Bowel sounds are normal. There is tenderness. There is no  rebound and no guarding.       Tenderness in RLQ and LLQ  Genitourinary:       External hemorroids, tender, bright red blood per rectum.  Musculoskeletal: She exhibits edema.       Trace edema over LE and UE bilaterally  Neurological: She is alert and oriented to person, place, and time.  Skin: Skin is warm and dry.    ED Course  Procedures (including critical care time) Pt with generalized body aches, weakness, difficulty ambulating. Abdominal pain. Rectal bleeding. Cough. States has seen blood in her cough up phlegm. Labs and x-rays pending. Will monitor.    Date: 12/25/2010  Rate: 69  Rhythm: normal sinus rhythm  QRS Axis: left  Intervals: normal  ST/T Wave abnormalities: normal, Q waves anteriorly  Conduction Disutrbances:none  Narrative Interpretation:   Old EKG Reviewed: changes noted New Q waves anteriorly when compared to ECG from 08/26/09   Pt weak, unsafe walking on her own. Lives alone. Appears dehydrated. Will start fluids. Admit to obs.  Spoke with triad, asked to admit to obs team 7.  Results for orders placed during the hospital encounter of 12/24/10  CBC      Component Value Range   WBC 6.4  4.0 - 10.5 (K/uL)   RBC 3.76 (*) 3.87 - 5.11 (MIL/uL)   Hemoglobin 11.5 (*) 12.0 - 15.0 (g/dL)   HCT 16.1 (*) 09.6 - 46.0 (%)   MCV 91.2  78.0 - 100.0 (fL)   MCH 30.6  26.0 - 34.0 (pg)   MCHC 33.5  30.0 - 36.0 (g/dL)   RDW 04.5  40.9 - 81.1 (%)   Platelets 520 (*) 150 - 400 (K/uL)  COMPREHENSIVE METABOLIC PANEL      Component Value Range   Sodium 129 (*) 135 - 145 (mEq/L)   Potassium 4.3  3.5 - 5.1 (mEq/L)   Chloride 96  96 - 112 (mEq/L)   CO2 23  19 - 32 (mEq/L)   Glucose, Bld 93  70 - 99 (mg/dL)   BUN 11  6 - 23 (mg/dL)   Creatinine, Ser 9.14  0.50 - 1.10 (mg/dL)   Calcium 9.4  8.4 - 78.2 (mg/dL)   Total Protein 7.4  6.0 - 8.3 (g/dL)   Albumin 3.2 (*) 3.5 - 5.2 (g/dL)   AST 12  0 - 37 (U/L)   ALT 7  0 - 35 (U/L)   Alkaline Phosphatase 128 (*) 39 - 117 (U/L)    Total Bilirubin 0.2 (*) 0.3 - 1.2 (mg/dL)   GFR calc non Af Amer 50 (*) >90 (mL/min)   GFR calc Af Amer 58 (*) >90 (mL/min)  LIPASE, BLOOD  Component Value Range   Lipase 21  11 - 59 (U/L)  URINALYSIS, ROUTINE W REFLEX MICROSCOPIC      Component Value Range   Color, Urine YELLOW  YELLOW    Appearance CLEAR  CLEAR    Specific Gravity, Urine 1.009  1.005 - 1.030    pH 7.0  5.0 - 8.0    Glucose, UA NEGATIVE  NEGATIVE (mg/dL)   Hgb urine dipstick NEGATIVE  NEGATIVE    Bilirubin Urine NEGATIVE  NEGATIVE    Ketones, ur NEGATIVE  NEGATIVE (mg/dL)   Protein, ur NEGATIVE  NEGATIVE (mg/dL)   Urobilinogen, UA 0.2  0.0 - 1.0 (mg/dL)   Nitrite NEGATIVE  NEGATIVE    Leukocytes, UA NEGATIVE  NEGATIVE   POCT PREGNANCY, URINE      Component Value Range   Preg Test, Ur POSITIVE    OCCULT BLOOD, POC DEVICE      Component Value Range   Fecal Occult Bld POSITIVE     Dg Chest 2 View  12/25/2010  *RADIOLOGY REPORT*  Clinical Data: 76 year old female with cough, shortness of breath, pain, bright red blood per rectum.  CHEST - 2 VIEW  Comparison: 09/28/2010 and earlier.  Findings: Stable cardiomegaly and mediastinal contours.  Hilar prominence is stable and probably relates to central vascular enlargement.  The lungs are clear. No acute osseous abnormality identified.  IMPRESSION: Stable cardiomegaly.  No acute cardiopulmonary abnormality.  Original Report Authenticated By: Harley Hallmark, M.D.   Dg Abd 2 Views  12/25/2010  *RADIOLOGY REPORT*  Clinical Data: 76 year old female with abdominal pain.  Bright red blood from rectum.  ABDOMEN - 2 VIEW  Comparison: CT abdomen pelvis 09/30/2010 and earlier.  Findings: Supine and left-side down lateral decubitus views.  No pneumoperitoneum. Nonobstructed bowel gas pattern.  Lung bases appear normal. No acute osseous abnormality identified.  Chronic pelvic phleboliths.  IMPRESSION: Nonobstructed bowel gas pattern, no free air.  Original Report Authenticated By:  Harley Hallmark, M.D.      MDM          Lottie Mussel, PA 12/25/10 0130

## 2010-12-24 NOTE — ED Notes (Signed)
Rectal bleeding for one hour she saw bright red blood.  She is also c/o hurting all over her body since yesterday

## 2010-12-25 ENCOUNTER — Encounter (HOSPITAL_COMMUNITY): Payer: Self-pay | Admitting: Internal Medicine

## 2010-12-25 DIAGNOSIS — G8929 Other chronic pain: Secondary | ICD-10-CM | POA: Diagnosis present

## 2010-12-25 DIAGNOSIS — I5042 Chronic combined systolic (congestive) and diastolic (congestive) heart failure: Secondary | ICD-10-CM | POA: Diagnosis present

## 2010-12-25 DIAGNOSIS — E871 Hypo-osmolality and hyponatremia: Secondary | ICD-10-CM | POA: Diagnosis present

## 2010-12-25 DIAGNOSIS — E860 Dehydration: Secondary | ICD-10-CM | POA: Diagnosis present

## 2010-12-25 DIAGNOSIS — K921 Melena: Secondary | ICD-10-CM | POA: Diagnosis present

## 2010-12-25 DIAGNOSIS — D473 Essential (hemorrhagic) thrombocythemia: Secondary | ICD-10-CM | POA: Diagnosis present

## 2010-12-25 DIAGNOSIS — N8110 Cystocele, unspecified: Secondary | ICD-10-CM | POA: Insufficient documentation

## 2010-12-25 LAB — CBC
HCT: 31.2 % — ABNORMAL LOW (ref 36.0–46.0)
MCH: 29.6 pg (ref 26.0–34.0)
MCV: 91.5 fL (ref 78.0–100.0)
Platelets: 528 10*3/uL — ABNORMAL HIGH (ref 150–400)
RDW: 13.5 % (ref 11.5–15.5)
WBC: 5 10*3/uL (ref 4.0–10.5)

## 2010-12-25 LAB — URINALYSIS, ROUTINE W REFLEX MICROSCOPIC
Glucose, UA: NEGATIVE mg/dL
Hgb urine dipstick: NEGATIVE
Ketones, ur: NEGATIVE mg/dL
Leukocytes, UA: NEGATIVE
Protein, ur: NEGATIVE mg/dL
pH: 7 (ref 5.0–8.0)

## 2010-12-25 LAB — BASIC METABOLIC PANEL
BUN: 9 mg/dL (ref 6–23)
CO2: 23 mEq/L (ref 19–32)
Calcium: 8.8 mg/dL (ref 8.4–10.5)
Chloride: 99 mEq/L (ref 96–112)
Creatinine, Ser: 0.92 mg/dL (ref 0.50–1.10)
GFR calc Af Amer: 69 mL/min — ABNORMAL LOW (ref >90)

## 2010-12-25 LAB — POCT PREGNANCY, URINE: Preg Test, Ur: POSITIVE

## 2010-12-25 LAB — GLUCOSE, CAPILLARY
Glucose-Capillary: 91 mg/dL (ref 70–99)
Glucose-Capillary: 90 mg/dL (ref 70–99)

## 2010-12-25 MED ORDER — HEPARIN SODIUM (PORCINE) 5000 UNIT/ML IJ SOLN
5000.0000 [IU] | Freq: Three times a day (TID) | INTRAMUSCULAR | Status: DC
  Administered 2010-12-25 – 2010-12-30 (×13): 5000 [IU] via SUBCUTANEOUS
  Filled 2010-12-25 (×19): qty 1

## 2010-12-25 MED ORDER — MORPHINE SULFATE 4 MG/ML IJ SOLN: 4.0000 mg | Freq: Once | INTRAMUSCULAR | Status: DC

## 2010-12-25 MED ORDER — SODIUM CHLORIDE 0.9 % IV SOLN
Freq: Once | INTRAVENOUS | Status: AC
  Administered 2010-12-25: 04:00:00 via INTRAVENOUS

## 2010-12-25 MED ORDER — POTASSIUM CHLORIDE CRYS ER 20 MEQ PO TBCR
20.0000 meq | EXTENDED_RELEASE_TABLET | Freq: Every day | ORAL | Status: DC
  Administered 2010-12-25 – 2011-01-12 (×18): 20 meq via ORAL
  Filled 2010-12-25 (×20): qty 1

## 2010-12-25 MED ORDER — ONDANSETRON HCL 4 MG/2ML IJ SOLN
4.0000 mg | Freq: Four times a day (QID) | INTRAMUSCULAR | Status: DC | PRN
  Administered 2010-12-27: 4 mg via INTRAVENOUS
  Filled 2010-12-25: qty 2

## 2010-12-25 MED ORDER — FUROSEMIDE 40 MG PO TABS
40.0000 mg | ORAL_TABLET | ORAL | Status: DC
  Administered 2010-12-26 – 2010-12-31 (×6): 40 mg via ORAL
  Filled 2010-12-25 (×8): qty 1

## 2010-12-25 MED ORDER — ONDANSETRON HCL 4 MG PO TABS
4.0000 mg | ORAL_TABLET | Freq: Four times a day (QID) | ORAL | Status: DC | PRN
  Administered 2010-12-29 (×2): 4 mg via ORAL
  Filled 2010-12-25 (×2): qty 1

## 2010-12-25 MED ORDER — ACETAMINOPHEN 325 MG PO TABS
650.0000 mg | ORAL_TABLET | Freq: Four times a day (QID) | ORAL | Status: DC | PRN
  Administered 2010-12-25 – 2011-01-06 (×16): 650 mg via ORAL
  Filled 2010-12-25 (×16): qty 2

## 2010-12-25 MED ORDER — DOCUSATE SODIUM 100 MG PO CAPS
100.0000 mg | ORAL_CAPSULE | Freq: Two times a day (BID) | ORAL | Status: DC
  Administered 2010-12-25 – 2011-01-12 (×36): 100 mg via ORAL
  Filled 2010-12-25 (×35): qty 1

## 2010-12-25 MED ORDER — CARVEDILOL 3.125 MG PO TABS
3.1250 mg | ORAL_TABLET | Freq: Two times a day (BID) | ORAL | Status: DC
  Administered 2010-12-25 – 2010-12-31 (×13): 3.125 mg via ORAL
  Filled 2010-12-25 (×17): qty 1

## 2010-12-25 MED ORDER — SODIUM CHLORIDE 0.9 % IV SOLN
Freq: Once | INTRAVENOUS | Status: AC
  Administered 2010-12-25: 02:00:00 via INTRAVENOUS

## 2010-12-25 MED ORDER — BISACODYL 5 MG PO TBEC
5.0000 mg | DELAYED_RELEASE_TABLET | Freq: Every day | ORAL | Status: DC
  Administered 2010-12-25 – 2010-12-27 (×3): 5 mg via ORAL
  Filled 2010-12-25 (×3): qty 1

## 2010-12-25 MED ORDER — FUROSEMIDE 80 MG PO TABS
80.0000 mg | ORAL_TABLET | Freq: Every day | ORAL | Status: DC
  Administered 2010-12-25: 80 mg via ORAL
  Filled 2010-12-25: qty 1

## 2010-12-25 MED ORDER — ACETAMINOPHEN 650 MG RE SUPP: 650.0000 mg | Freq: Four times a day (QID) | RECTAL | Status: DC | PRN

## 2010-12-25 MED ORDER — ASPIRIN 81 MG PO CHEW
81.0000 mg | CHEWABLE_TABLET | Freq: Every day | ORAL | Status: DC
  Administered 2010-12-25 – 2010-12-29 (×5): 81 mg via ORAL
  Filled 2010-12-25 (×6): qty 1

## 2010-12-25 MED ORDER — SENNA 8.6 MG PO TABS
2.0000 | ORAL_TABLET | Freq: Every day | ORAL | Status: DC | PRN
  Filled 2010-12-25: qty 2

## 2010-12-25 MED ORDER — TRAMADOL HCL 50 MG PO TABS
50.0000 mg | ORAL_TABLET | Freq: Four times a day (QID) | ORAL | Status: DC | PRN
  Administered 2010-12-25 – 2010-12-29 (×12): 50 mg via ORAL
  Filled 2010-12-25 (×12): qty 1

## 2010-12-25 NOTE — Progress Notes (Signed)
Physical Therapy Treatment Patient Details Name: [name] MRN: [id number] DOB: [birth year] Today's Date: 12/25/2010  PT Assessment/Plan  PT - Assessment/Plan PT Frequency: Min 3X/week Follow Up Recommendations: Home health PT Equipment Recommended: None recommended by PT PT Goals  Acute Rehab PT Goals PT Goal Formulation: With patient Time For Goal Achievement: 2 weeks Pt will go Supine/Side to Sit: Independently;with HOB 0 degrees PT Goal: Supine/Side to Sit - Progress: Other (comment) (not tested today) Pt will go Sit to Supine/Side: Independently;with HOB 0 degrees PT Goal: Sit to Supine/Side - Progress: Other (comment) (not tested today) Pt will Transfer Sit to Stand/Stand to Sit: with modified independence;with upper extremity assist PT Transfer Goal: Sit to Stand/Stand to Sit - Progress: Progressing toward goal Pt will Transfer Bed to Chair/Chair to Bed: with modified independence PT Transfer Goal: Bed to Chair/Chair to Bed - Progress: Other (comment) (not tested today) Pt will Ambulate: 51 - 150 feet;with modified independence;with rolling walker PT Goal: Ambulate - Progress: Progressing toward goal Pt will Go Up / Down Stairs: 1-2 stairs;with modified independence;with rolling walker PT Goal: Up/Down Stairs - Progress: Other (comment) (not tested today)  PT Treatment Precautions/Restrictions  Precautions Precautions: Fall Restrictions Weight Bearing Restrictions: No Mobility (including Balance) Bed Mobility Bed Mobility: Yes Sitting - Scoot to Edge of Bed: 4: Min assist;With rail Sitting - Scoot to [place] of Bed Details (indicate cue type and reason): min assist to hlep weight shift hips on compliant mattress.  Patient was already sitting when PT entered room.   Transfers Transfers: Yes Sit to Stand: 3: Mod assist;With upper extremity assist;From bed;From chair/3-in-1;From toilet Sit to Stand Details (indicate cue type and reason): multiple stands requiring mod  asisst of trunk to get to standing over weak legs (pt using momentum) from bed, toliet, stright chair Stand to Sit: 4: Min assist;With upper extremity assist;To chair/3-in-1;To toilet Stand to Sit Details: min assist to support trunk to help control descent to sit.  Despite heavy use of arm patient still unable to control descent to sit in chair.   Ambulation/Gait Ambulation/Gait: Yes Ambulation/Gait Assistance: 4: Min assist Ambulation/Gait Assistance Details (indicate cue type and reason): min guard assist to help patient steady her trunk for balance.   Ambulation Distance (Feet): 40 Feet (20'x2) Assistive device: Rolling walker Gait Pattern: Step-to pattern;Shuffle;Trunk flexed    Exercise    End of Session PT - End of Session Equipment Utilized During Treatment: Other (comment) (RW) Activity Tolerance: Patient tolerated treatment well Patient left: in chair;with call bell in reach Nurse Communication:  (RN tech- to please change wet sheets, and + bath with PT) General Behavior During Session: [facility] for tasks performed Cognition: Impaired (decreased STM)  Lurena Joiner B. Aaronjames Kelsay, PT, DPT [phone]   12/25/2010, 9:59 AM

## 2010-12-25 NOTE — ED Notes (Signed)
Pt hooked up to entire set of monitoring.  Reports having pain 'all over'.  Pt noted to be resting.  Updated on POC.  No distress noted.

## 2010-12-25 NOTE — H&P (Signed)
PCP:  PRIMARY CARE DOCTOR:  Gretta Arab. Valentina Lucks, MD, at [facility].  CARDIOLOGIST:  Rollene Rotunda, MD, [facility]  OPHTHALMOLOGIST:  Dr. Serafina Royals.  RHEUMATOLOGIST: Dr. Nickola Major   Chief Complaint:  Hematochezia  HPI:  76yoF with h/o systolic and diastolic CHF with (EF 45%), hypertension, chronic  constipation, chronic pain, diverticulosis who presents with hematochezia and likely dehydration.   She is known to this Clinical research associate as I admitted her 2 months ago with a multitude of  complaints, including pain all over her body from her head to her toe, abdominal pain,  malaise. She was also having episcleritis at that time, and Ophtho had recommended  Rheumatologic evaluation. W/u that admission showed a negative ANA, ESR, CRP, C3/C4, RF,  HIV, RPR, and normal TSH. Per d/c summary, possibly had positive P-ANCA, but I cannot find this lab value  and unclear what the upshot of that was, however she was supposed to see a Rheumatologist Dr. Nickola Major on  discharge which that patient said she did, but can't give any more info about that.   Today she comes in with a couple episodes of BRBPR and pain all over her body. She reports having 2 BM's this  morning that were normal, but then later in the afternoon had a couple more that were BRBPR, which scared her.  She states she's never had this before, but when asked about the Dx of diverticulosis she carries, she seems to  know what it is but not sure why she carries this Dx.   Vitals in the ED stable. Rectal exam in the ED showed external hemorrhoids. W/u was otherwise unremarkable, her chronic  hypoNa is at baseline, however Cr is a bit higher at 1.06 from baseline of 0.5-0.6, and Hct is 34.3 which is up from prior value of 29,  plts are at 520, so she could in fact be a bit dehydrated. UA was negative, fecal occult blood was positive. She had abd plain  film and CXR that were unremarkable.   They tried to discharge her from the ED,  however when trying to get her up out of bed, she was so weak they couldn't walk  her and so Obs admission was requested.    Review of Systems:  At present, pt is complaining of pain all over in a non-specific way. This is similar to when I evaluated her in 09/2010. She is also having pain in her eyeballs, which are still quite red. She has been taking PO's OK. Denies dizziness, LH,  chest pain, difficulty breathing. Otherwise, unremarkable    Past Medical History  Diagnosis Date  . HTN (hypertension)   . Diverticulosis   . Hemorrhoid   . Osteoarthritis   . Constipation, chronic   . UTI (lower urinary tract infection)   . H/O: hysterectomy   . Systolic and diastolic CHF, chronic     45%  . Female bladder prolapse     with prolapse uterus, cystocele, s/p TVH/BSO    Past Surgical History  Procedure Date  . Wrist surgery     Medications:  HOME MEDS: Prior to Admission medications   Medication Sig Start Date End Date Taking? Authorizing Provider  aspirin 81 MG tablet Take 81 mg by mouth daily.     Yes Historical Provider, MD  bisacodyl (DULCOLAX) 5 MG EC tablet Take 5 mg by mouth daily. For   Yes Historical Provider, MD  carvedilol (COREG) 3.125 MG tablet   09/04/10  Yes Fayrene Fearing  Hochrein, MD  conjugated estrogens (PREMARIN) vaginal cream Place 0.5 g vaginally daily.     Yes Historical Provider, MD  ferrous fumarate (FERRETTS) 325 (106 FE) MG TABS Take by mouth 2 (two) times daily.     Yes Historical Provider, MD  furosemide (LASIX) 40 MG tablet Take 40 mg by mouth daily.     Yes Historical Provider, MD  KLOR-CON M20 20 MEQ tablet take 2 tablets by mouth twice a day 11/15/10  Yes Rollene Rotunda, MD  lisinopril (PRINIVIL,ZESTRIL) 10 MG tablet take 1 tablet by mouth once daily 11/27/10  Yes Rollene Rotunda, MD  traMADol (ULTRAM) 50 MG tablet Take 50 mg by mouth every 6 (six) hours as needed. For pain   Yes Historical Provider, MD    Allergies:  Allergies  Allergen Reactions  .  Latex   . Penicillins   . Tenivac     Social History:   reports that she has never smoked. She does not have any smokeless tobacco history on file. She reports that she does not drink alcohol. Her drug history not on file.  Family History: Family History  Problem Relation Age of Onset  . Diabetes    . Hypertension    . Leukemia      Physical Exam: Filed Vitals:   12/25/10 0052 12/25/10 0144 12/25/10 0145 12/25/10 0145  BP: 140/50 140/50 127/55 128/88  Pulse:  71 71 84  Temp:      TempSrc:      Resp: 16     SpO2: 99% 100% 100% 100%   Blood pressure 128/88, pulse 84, temperature 98.1 F (36.7 C), temperature source Oral, resp. rate 16, SpO2 100.00%.  Gen: Elderly F in ED stretcher, who awakens to voice, answers questions and is pleasant, can relate her history albeit somewhat poorly. In no distress, but does writhe around a bit dramatically and complains of pain to anywhere on her body that I touch, with the minimalist amount of pressure HEENT: She won't open her eyes fully to command, but when I pry them open a bit, I can see frank scleritis. Mouth is normal appearing grossly Lungs: Sits up in stretcher with assistance, and lungs have light basilar inspiratory crackles but not overt, otherwise is fairly CTAB with fair air movement Heart: Soft, difficult to auscultate but RRR, no with early peaking flow type systolic murmur  Abd: Obese, soft, non-peritoneal but she c/o pain in all quadrants Extrem: Warm, well perfused, no cyanosis, no BLE edema despite her stating their is swelling Neuro: Alert, conversant, moving extremities, non-focal  Skin: No gross rashes, non-diaphoretic      Labs & Imaging Results for orders placed during the hospital encounter of 12/24/10 (from the past 48 hour(s))  CBC     Status: Abnormal   Collection Time   12/24/10 10:08 PM      Component Value Range Comment   WBC 6.4  4.0 - 10.5 (K/uL)    RBC 3.76 (*) 3.87 - 5.11 (MIL/uL)    Hemoglobin 11.5  (*) 12.0 - 15.0 (g/dL)    HCT 56.2 (*) 13.0 - 46.0 (%)    MCV 91.2  78.0 - 100.0 (fL)    MCH 30.6  26.0 - 34.0 (pg)    MCHC 33.5  30.0 - 36.0 (g/dL)    RDW 86.5  78.4 - 69.6 (%)    Platelets 520 (*) 150 - 400 (K/uL)   COMPREHENSIVE METABOLIC PANEL     Status: Abnormal   Collection Time  12/24/10 10:08 PM      Component Value Range Comment   Sodium 129 (*) 135 - 145 (mEq/L)    Potassium 4.3  3.5 - 5.1 (mEq/L)    Chloride 96  96 - 112 (mEq/L)    CO2 23  19 - 32 (mEq/L)    Glucose, Bld 93  70 - 99 (mg/dL)    BUN 11  6 - 23 (mg/dL)    Creatinine, Ser 0.45  0.50 - 1.10 (mg/dL)    Calcium 9.4  8.4 - 10.5 (mg/dL)    Total Protein 7.4  6.0 - 8.3 (g/dL)    Albumin 3.2 (*) 3.5 - 5.2 (g/dL)    AST 12  0 - 37 (U/L)    ALT 7  0 - 35 (U/L)    Alkaline Phosphatase 128 (*) 39 - 117 (U/L)    Total Bilirubin 0.2 (*) 0.3 - 1.2 (mg/dL)    GFR calc non Af Amer 50 (*) >90 (mL/min)    GFR calc Af Amer 58 (*) >90 (mL/min)   LIPASE, BLOOD     Status: Normal   Collection Time   12/24/10 10:08 PM      Component Value Range Comment   Lipase 21  11 - 59 (U/L)   POCT PREGNANCY, URINE     Status: Normal   Collection Time   12/24/10 10:30 PM      Component Value Range Comment   Preg Test, Ur POSITIVE     URINALYSIS, ROUTINE W REFLEX MICROSCOPIC     Status: Normal   Collection Time   12/24/10 11:28 PM      Component Value Range Comment   Color, Urine YELLOW  YELLOW     Appearance CLEAR  CLEAR     Specific Gravity, Urine 1.009  1.005 - 1.030     pH 7.0  5.0 - 8.0     Glucose, UA NEGATIVE  NEGATIVE (mg/dL)    Hgb urine dipstick NEGATIVE  NEGATIVE     Bilirubin Urine NEGATIVE  NEGATIVE     Ketones, ur NEGATIVE  NEGATIVE (mg/dL)    Protein, ur NEGATIVE  NEGATIVE (mg/dL)    Urobilinogen, UA 0.2  0.0 - 1.0 (mg/dL)    Nitrite NEGATIVE  NEGATIVE     Leukocytes, UA NEGATIVE  NEGATIVE  MICROSCOPIC NOT DONE ON URINES WITH NEGATIVE PROTEIN, BLOOD, LEUKOCYTES, NITRITE, OR GLUCOSE <1000 mg/dL.  OCCULT BLOOD,  POC DEVICE     Status: Normal   Collection Time   12/24/10 11:32 PM      Component Value Range Comment   Fecal Occult Bld POSITIVE      Dg Chest 2 View  12/25/2010  *RADIOLOGY REPORT*  Clinical Data: 76 year old female with cough, shortness of breath, pain, bright red blood per rectum.  CHEST - 2 VIEW  Comparison: 09/28/2010 and earlier.  Findings: Stable cardiomegaly and mediastinal contours.  Hilar prominence is stable and probably relates to central vascular enlargement.  The lungs are clear. No acute osseous abnormality identified.  IMPRESSION: Stable cardiomegaly.  No acute cardiopulmonary abnormality.  Original Report Authenticated By: Harley Hallmark, M.D.   Dg Abd 2 Views  12/25/2010  *RADIOLOGY REPORT*  Clinical Data: 76 year old female with abdominal pain.  Bright red blood from rectum.  ABDOMEN - 2 VIEW  Comparison: CT abdomen pelvis 09/30/2010 and earlier.  Findings: Supine and left-side down lateral decubitus views.  No pneumoperitoneum. Nonobstructed bowel gas pattern.  Lung bases appear normal. No acute osseous abnormality identified.  Chronic  pelvic phleboliths.  IMPRESSION: Nonobstructed bowel gas pattern, no free air.  Original Report Authenticated By: Harley Hallmark, M.D.    Impression Present on Admission:  .Chronic pain .Hematochezia .Dehydration .Systolic and diastolic CHF, chronic   76yoF with h/o systolic and diastolic CHF with (EF 45%), hypertension, chronic constipation, chronic pain,  diverticulosis who presents with hematochezia and likely dehydration.   1. Weakness, dehydration: Hct, plts, Cr are all elevated above baseline. Will give IVF's overnight. Doubt due to hematochezia.  - Hold home Lisinpril given renal fxn - PT consultation, orthostatics x1   2. Hematochezia: Likely hemorrhoids seen in the ED vs known h/o diverticulosis. Hct is actually above baseline, not below.  Hemodynamics appears stable. Will monitor overnight, no need for transfusion at this  point.   - Monitor CBC q12  3. Whole body pain: This appears to be a chronic issue for her, and it was when I met her  two months ago as well. I think she has fibromyalgia, as evidenced by exaggerated pain  response when any part of her body is touched with the slightest pressure. DDx includes  Rheum disease, but this was basically ruled out last admission with negative w/u,  negative inflammatory markers, therefore tend to doubt. DDx also includes psychosomatic  pain. Continue home Tramadol   4. Positive urine pregnancy: E-chart notes indicated she has had a hysterectomy, so could  not be pregnant. Of note, during last admission had CEA, CA19-9 checked for unclear reasons, not mentioned in  D/C summary, but CEA was negative making a tumor of the reproductive organs also less likely. Not entirely sure why ths was positive. Consider f/u with Gyn.   5. Chronic systolic and diastolic HF: appears euvolemic and warm at this time. CXR negative. No acute issues.  - Continue Coreg, Lasix, potassium, ASA 81 - hold Lisinopril   6. Med rec: Holding non-essential home meds, estrogen cream, iron  Presumed full, unable to fully discuss Regular bed, team 7  Other plans as per orders.  Haroldine Redler 12/25/2010, 2:43 AM

## 2010-12-25 NOTE — ED Notes (Signed)
I pressed upreg on nova machine by mistake it was poct occult blood stool card that was positive Dr was informed of results.

## 2010-12-25 NOTE — Progress Notes (Addendum)
Subjective: No events overnight.  Pt now reports epigastric discomfort that started few hours ago and associated with small amount of diarrhea. Pt denies chest pain, no additional hematochezia, no no shortness of breath.  Objective:  Vital signs in last 24 hours:  Filed Vitals:   Jan 16, 2011 0145 2011-01-16 0321 01-16-11 0610 01-16-11 1045  BP: 128/88 138/78 127/67 117/74  Pulse: 84 67 71 71  Temp:  98.8 F (37.1 C) 98.8 F (37.1 C)   TempSrc:  Oral Oral   Resp:  18 18   Height:  5\' 1"  (1.549 m)    Weight:  94.348 kg (208 lb)    SpO2: 100% 97% 97%     Intake/Output from previous day:   Intake/Output Summary (Last 24 hours) at 01/16/11 1221 Last data filed at 16-Jan-2011 1006  Gross per 24 hour  Intake    360 ml  Output      0 ml  Net    360 ml    Physical Exam: General: Alert, awake, oriented x3, in no acute distress. HEENT: No bruits, no goiter. Heart: Regular rate and rhythm, without murmurs, rubs, gallops. Lungs: Clear to auscultation bilaterally. Poor inspiratory effort, minimal bibasilar crackles Abdomen: Soft, epigastric tenderness, nondistended, positive bowel sounds. Extremities: No clubbing cyanosis or edema with positive pedal pulses. Neuro: Grossly intact, nonfocal.   Lab Results:  Basic Metabolic Panel:    Component Value Date/Time   NA 130* 2011/01/16 0652   K 4.4 2011-01-16 0652   CL 99 2011-01-16 0652   CO2 23 01/16/11 0652   BUN 9 16-Jan-2011 0652   CREATININE 0.92 January 16, 2011 0652   GLUCOSE 80 January 16, 2011 0652   CALCIUM 8.8 January 16, 2011 0652   CBC:    Component Value Date/Time   WBC 5.0 01-16-2011 0652   HGB 10.1* 01/16/2011 0652   HCT 31.2* 01-16-11 0652   PLT 528* 01-16-2011 0652   MCV 91.5 January 16, 2011 0652   NEUTROABS 8.1* 10/02/2010 0650   LYMPHSABS 2.4 10/02/2010 0650   MONOABS 0.9 10/02/2010 0650   EOSABS 0.1 10/02/2010 0650   BASOSABS 0.0 10/02/2010 0650    No results found for this or any previous visit (from the past 240  hour(s)).  Studies/Results: Dg Chest 2 View 2011-01-16   IMPRESSION: Stable cardiomegaly.  No acute cardiopulmonary abnormality.   Dg Abd 2 View 01-16-11   IMPRESSION: Nonobstructed bowel gas pattern, no free air.    Medications: Scheduled Meds:   . sodium chloride   Intravenous Once  . sodium chloride   Intravenous Once  . aspirin  81 mg Oral Daily  . bisacodyl  5 mg Oral Daily  . carvedilol  3.125 mg Oral BID WC  . docusate sodium  100 mg Oral BID  . furosemide  80 mg Oral Daily  . heparin  5,000 Units Subcutaneous Q8H  . morphine  4 mg Intravenous Once  . ondansetron  4 mg Intravenous Once  . potassium chloride SA  20 mEq Oral Daily  . DISCONTD: morphine  4 mg Intravenous Once   Continuous Infusions:  PRN Meds:.acetaminophen, acetaminophen, ondansetron (ZOFRAN) IV, ondansetron, senna, traMADol  Assessment/Plan:  Principal Problem:  *Hematochezia - BRBPR most likely secondary to hemorrhoids, Hg drop 11.5 --> 10.1 but this could also be dilutional. Will continue to follow up on CBC and obtain set in AM. Pt denies current BRBPR  Active Problems:  Systolic and diastolic CHF, chronic - will decreased the dose of lasix for now and will d/c IVF   Chronic pain -  continue analgesia for proper pain control   Dehydration - adequately hydrated, continue low dose lasix for now and encourage PO intake   Hyponatremia - remains stable and at pt's baseline   Thrombocytosis - perhaps reactive, unclear etiology, will repeat CBC in AM    LOS: 1 day   MAGICK-Carlinda Ohlson 12/25/2010, 12:21 PM

## 2010-12-25 NOTE — Progress Notes (Signed)
UTILIZATION REVIEW COMPLETE  Willa Rough 12/25/2010 [phone] OR [phone]

## 2010-12-26 LAB — BASIC METABOLIC PANEL
BUN: 10 mg/dL (ref 6–23)
Creatinine, Ser: 0.77 mg/dL (ref 0.50–1.10)
GFR calc Af Amer: >90 mL/min (ref >90)
GFR calc non Af Amer: 81 mL/min — ABNORMAL LOW (ref >90)
Potassium: 3.6 mEq/L (ref 3.5–5.1)

## 2010-12-26 LAB — CBC
HCT: 32.5 % — ABNORMAL LOW (ref 36.0–46.0)
MCHC: 33.5 g/dL (ref 30.0–36.0)
RDW: 13.4 % (ref 11.5–15.5)

## 2010-12-26 LAB — GLUCOSE, CAPILLARY

## 2010-12-26 NOTE — Progress Notes (Signed)
Subjective: No events overnight.   Objective:  Vital signs in last 24 hours:  Filed Vitals:   12/25/10 1045 12/25/10 1500 12/25/10 2200 12/26/10 0600  BP: 117/74 144/71 142/84 150/76  Pulse: 71 64 72 73  Temp:  98.1 F (36.7 C) 98 F (36.7 C) 98.3 F (36.8 C)  TempSrc:   Oral Oral  Resp:  20 20 19   Height:      Weight:    95.4 kg (210 lb 5.1 oz)  SpO2:  94% 97% 94%    Intake/Output from previous day:   Intake/Output Summary (Last 24 hours) at 12/26/10 0804 Last data filed at 12/25/10 1800  Gross per 24 hour  Intake    860 ml  Output      0 ml  Net    860 ml    Physical Exam: General: Alert, awake, oriented x3, in no acute distress. HEENT: No bruits, no goiter. Heart: Regular rate and rhythm, SEM 2/6, no rubs, gallops. Lungs: Clear to auscultation bilaterally. Poor inspiratory effort Abdomen: Soft, nontender, nondistended, positive bowel sounds. Extremities: No clubbing cyanosis, positive pedal pulses, Bilateral pitting edema +1 Neuro: Grossly intact, nonfocal.   Lab Results:  Basic Metabolic Panel:    Component Value Date/Time   NA 130* 12/25/2010 0652   K 4.4 12/25/2010 0652   CL 99 12/25/2010 0652   CO2 23 12/25/2010 0652   BUN 9 12/25/2010 0652   CREATININE 0.92 12/25/2010 0652   GLUCOSE 80 12/25/2010 0652   CALCIUM 8.8 12/25/2010 0652   CBC:    Component Value Date/Time   WBC 5.0 12/25/2010 0652   HGB 10.1* 12/25/2010 0652   HCT 31.2* 12/25/2010 0652   PLT 528* 12/25/2010 0652   MCV 91.5 12/25/2010 0652   NEUTROABS 8.1* 10/02/2010 0650   LYMPHSABS 2.4 10/02/2010 0650   MONOABS 0.9 10/02/2010 0650   EOSABS 0.1 10/02/2010 0650   BASOSABS 0.0 10/02/2010 0650    Recent Results (from the past 240 hour(s))  MRSA PCR SCREENING     Status: Normal   Collection Time   12/25/10 11:03 AM      Component Value Range Status Comment   MRSA by PCR NEGATIVE  NEGATIVE  Final     Studies/Results: Dg Chest 2 View  12/25/2010    IMPRESSION: Stable  cardiomegaly.  No acute cardiopulmonary abnormality.    Dg Abd 2 Views  12/25/2010   IMPRESSION: Nonobstructed bowel gas pattern, no free air.    Medications: Scheduled Meds:   . aspirin  81 mg Oral Daily  . bisacodyl  5 mg Oral Daily  . carvedilol  3.125 mg Oral BID WC  . docusate sodium  100 mg Oral BID  . furosemide  40 mg Oral Q24H  . heparin  5,000 Units Subcutaneous Q8H  . potassium chloride SA  20 mEq Oral Daily  . DISCONTD: furosemide  80 mg Oral Daily   Continuous Infusions:  PRN Meds:.acetaminophen, acetaminophen, ondansetron (ZOFRAN) IV, ondansetron, senna, traMADol  Assessment/Plan:  Principal Problem:  *Hematochezia - no additional episodes of hematochezia, CBC pending this morning, encourage PT and ambulation, OOB to chair, encourage PO intake, obtain CBC in AM  Active Problems:  Systolic and diastolic CHF, chronic - clinically compensated, continue lasix   Chronic pain - provide analgesia for adequate pain control   Thrombocytosis - CBC pending this AM   Hyponatremia - remains stable and at pt's baseline   Disposition - anticipate d/c in 1-2 days    LOS: 2 days  MAGICK-MYERS, ISKRA 12/26/2010, 8:04 AM

## 2010-12-27 ENCOUNTER — Observation Stay (HOSPITAL_COMMUNITY): Payer: Medicare Other

## 2010-12-27 DIAGNOSIS — R109 Unspecified abdominal pain: Secondary | ICD-10-CM | POA: Diagnosis present

## 2010-12-27 LAB — CBC
HCT: 33.1 % — ABNORMAL LOW (ref 36.0–46.0)
MCHC: 33.2 g/dL (ref 30.0–36.0)
MCV: 90.7 fL (ref 78.0–100.0)
RDW: 13.4 % (ref 11.5–15.5)

## 2010-12-27 LAB — URINE MICROSCOPIC-ADD ON

## 2010-12-27 LAB — URINALYSIS, ROUTINE W REFLEX MICROSCOPIC
Bilirubin Urine: NEGATIVE
Protein, ur: NEGATIVE mg/dL
Urobilinogen, UA: 1 mg/dL (ref 0.0–1.0)

## 2010-12-27 LAB — BASIC METABOLIC PANEL
BUN: 9 mg/dL (ref 6–23)
Calcium: 9.3 mg/dL (ref 8.4–10.5)
Creatinine, Ser: 0.72 mg/dL (ref 0.50–1.10)
GFR calc Af Amer: >90 mL/min (ref >90)
GFR calc non Af Amer: 83 mL/min — ABNORMAL LOW (ref >90)

## 2010-12-27 MED ORDER — BISACODYL 5 MG PO TBEC
5.0000 mg | DELAYED_RELEASE_TABLET | Freq: Two times a day (BID) | ORAL | Status: DC
  Administered 2010-12-28 – 2010-12-30 (×4): 5 mg via ORAL
  Filled 2010-12-27 (×5): qty 1

## 2010-12-27 MED ORDER — IOHEXOL 300 MG/ML  SOLN
100.0000 mL | Freq: Once | INTRAMUSCULAR | Status: AC | PRN
  Administered 2010-12-27: 100 mL via INTRAVENOUS

## 2010-12-27 MED ORDER — POLYETHYLENE GLYCOL 3350 17 G PO PACK
17.0000 g | PACK | Freq: Every day | ORAL | Status: DC
  Administered 2010-12-27 – 2010-12-29 (×3): 17 g via ORAL
  Filled 2010-12-27 (×4): qty 1

## 2010-12-27 NOTE — Progress Notes (Signed)
Subjective: No events overnight. Pt reports persistent lower abdominal pain, denies N/V, no chest pain or shortness of breath.  Objective:  Vital signs in last 24 hours:  Filed Vitals:   12/26/10 0600 12/26/10 1400 12/26/10 2116 12/27/10 0545  BP: 150/76 123/78 148/84 156/89  Pulse: 73 77 74 69  Temp: 98.3 F (36.8 C) 98 F (36.7 C) 98.9 F (37.2 C) 98.3 F (36.8 C)  TempSrc: Oral Oral Oral Oral  Resp: 19 20 22 20   Height:      Weight: 95.4 kg (210 lb 5.1 oz)     SpO2: 94% 93% 97% 97%    Intake/Output from previous day:   Intake/Output Summary (Last 24 hours) at 12/27/10 1045 Last data filed at 12/26/10 1700  Gross per 24 hour  Intake    600 ml  Output      0 ml  Net    600 ml    Physical Exam: General: Alert, awake, follows commands appropriately, in no acute distress. HEENT: No bruits, no goiter. Heart: Regular rate and rhythm, SEM 2/6, no rubs, gallops. Lungs: Clear to auscultation bilaterally.poor inspiratory effort, decreased breath sounds at bases Abdomen: Soft, nontender, nondistended, positive bowel sounds. Extremities: No clubbing cyanosis, with positive pedal pulses.Bilateral +1 pitting edema Neuro: Grossly intact, nonfocal.   Lab Results:  Basic Metabolic Panel:    Component Value Date/Time   NA 132* 12/27/2010 0710   K 3.6 12/27/2010 0710   CL 95* 12/27/2010 0710   CO2 27 12/27/2010 0710   BUN 9 12/27/2010 0710   CREATININE 0.72 12/27/2010 0710   GLUCOSE 88 12/27/2010 0710   CALCIUM 9.3 12/27/2010 0710   CBC:    Component Value Date/Time   WBC 4.5 12/27/2010 0710   HGB 11.0* 12/27/2010 0710   HCT 33.1* 12/27/2010 0710   PLT 540* 12/27/2010 0710   MCV 90.7 12/27/2010 0710   NEUTROABS 8.1* 10/02/2010 0650   LYMPHSABS 2.4 10/02/2010 0650   MONOABS 0.9 10/02/2010 0650   EOSABS 0.1 10/02/2010 0650   BASOSABS 0.0 10/02/2010 0650    Recent Results (from the past 240 hour(s))  MRSA PCR SCREENING     Status: Normal   Collection Time   12/25/10 11:03 AM      Component Value Range Status Comment   MRSA by PCR NEGATIVE  NEGATIVE  Final     Studies/Results: No results found.  Medications: Scheduled Meds:   . aspirin  81 mg Oral Daily  . bisacodyl  5 mg Oral Daily  . carvedilol  3.125 mg Oral BID WC  . docusate sodium  100 mg Oral BID  . furosemide  40 mg Oral Q24H  . heparin  5,000 Units Subcutaneous Q8H  . potassium chloride SA  20 mEq Oral Daily   Continuous Infusions:  PRN Meds:.acetaminophen, acetaminophen, ondansetron (ZOFRAN) IV, ondansetron, senna, traMADol  Assessment/Plan:  Principal Problem:  *Hematochezia  - appears to be resolved - Hg/Hct remain stable and at patient's baseline  Active Problems:  HYPERTENSION - well controlled   CONSTIPATION, CHRONIC - initiate additional bowel regimen since pt still constipated and has persistent abdominal pain   Systolic and diastolic CHF, chronic - clinically compensated - continue Lasix at the current dose and monitor strict I's and O's   Thrombocytosis - likely reactive - CBC in AM   Hyponatremia - remains stable and at pt's baseline   Abdominal Pain  - unclear etiology and work up so far negative - check UA and urine cultures - obtain  CT abd and pelvis for further evaluation   Disposition - discuss placement issue with SW and family - anticipated d/c in 1-2 days    LOS: 3 days   MAGICK-Omelia Marquart 12/27/2010, 10:45 AM

## 2010-12-27 NOTE — Progress Notes (Signed)
Patient returned from CT abdomen. Stated she was "throwing up blood" downstairs. None observed since return. Zofran 4mg  iv given, will continue to monitor.

## 2010-12-28 MED ORDER — WHITE PETROLATUM GEL
Status: AC
  Administered 2010-12-28: 16:00:00
  Filled 2010-12-28: qty 5

## 2010-12-28 MED ORDER — SENNA 8.6 MG PO TABS
2.0000 | ORAL_TABLET | Freq: Two times a day (BID) | ORAL | Status: DC
  Administered 2010-12-29 – 2010-12-30 (×3): 17.2 mg via ORAL
  Filled 2010-12-28 (×6): qty 2

## 2010-12-28 MED ORDER — SALINE SPRAY 0.65 % NA SOLN
2.0000 | NASAL | Status: DC | PRN
  Administered 2011-01-06: 2 via NASAL
  Filled 2010-12-28 (×2): qty 44

## 2010-12-28 MED ORDER — HYDROCOD POLST-CHLORPHEN POLST 10-8 MG/5ML PO LQCR
5.0000 mL | Freq: Two times a day (BID) | ORAL | Status: DC | PRN
  Administered 2010-12-29: 5 mL via ORAL
  Filled 2010-12-28: qty 5

## 2010-12-28 MED ORDER — WHITE PETROLATUM GEL
Status: AC
  Filled 2010-12-28: qty 5

## 2010-12-28 MED ORDER — PSEUDOEPHEDRINE HCL 30 MG PO TABS
30.0000 mg | ORAL_TABLET | Freq: Three times a day (TID) | ORAL | Status: DC | PRN
  Administered 2010-12-29: 30 mg via ORAL
  Filled 2010-12-28: qty 1

## 2010-12-28 NOTE — ED Provider Notes (Signed)
Medical screening examination/treatment/procedure(s) were performed by non-physician practitioner and as supervising physician I was immediately available for consultation/collaboration.  Juliet Rude. Rubin Payor, MD 12/28/10 [phone]

## 2010-12-28 NOTE — Progress Notes (Signed)
Patient ID: [name], female   DOB: 1934/10/19, 76 y.o.   MRN: [id number]  Subjective: No events overnight.   Objective:  Vital signs in last 24 hours:  Filed Vitals:   12/28/10 0156 12/28/10 0547 12/28/10 1505 12/28/10 2133  BP: 136/85 162/84 158/69 152/82  Pulse: 82 91 90 85  Temp: 98 F (36.7 C) 98.6 F (37 C) 99 F (37.2 C) 98.8 F (37.1 C)  TempSrc: Oral Oral Oral Oral  Resp: 20 22 24 20   Height:      Weight:  96.2 kg (212 lb 1.3 oz)    SpO2: 96% 98% 98% 92%    Intake/Output from previous day:   Intake/Output Summary (Last 24 hours) at 12/28/10 2258 Last data filed at 12/28/10 1800  Gross per 24 hour  Intake    800 ml  Output      0 ml  Net    800 ml    Physical Exam: General: Alert, awake, oriented x3, in no acute distress. HEENT: No bruits, no goiter. Heart: Regular rate and rhythm, without murmurs, rubs, gallops. Lungs: Clear to auscultation bilaterally. Abdomen: Soft, epigastric tenderness, nondistended, positive bowel sounds. Extremities: No clubbing cyanosis or edema with positive pedal pulses. Neuro: Grossly intact, nonfocal.   Lab Results:  Basic Metabolic Panel:    Component Value Date/Time   NA 132* 12/27/2010 0710   K 3.6 12/27/2010 0710   CL 95* 12/27/2010 0710   CO2 27 12/27/2010 0710   BUN 9 12/27/2010 0710   CREATININE 0.72 12/27/2010 0710   GLUCOSE 88 12/27/2010 0710   CALCIUM 9.3 12/27/2010 0710   CBC:    Component Value Date/Time   WBC 4.5 12/27/2010 0710   HGB 11.0* 12/27/2010 0710   HCT 33.1* 12/27/2010 0710   PLT 540* 12/27/2010 0710   MCV 90.7 12/27/2010 0710   NEUTROABS 8.1* 10/02/2010 0650   LYMPHSABS 2.4 10/02/2010 0650   MONOABS 0.9 10/02/2010 0650   EOSABS 0.1 10/02/2010 0650   BASOSABS 0.0 10/02/2010 0650    Recent Results (from the past 240 hour(s))  MRSA PCR SCREENING     Status: Normal   Collection Time   12/25/10 11:03 AM      Component Value Range Status Comment   MRSA by PCR NEGATIVE  NEGATIVE  Final      Studies/Results: Ct Abdomen Pelvis W Contrast 12/27/2010   IMPRESSION: Motion degraded images.  No evidence of bowel obstruction.  Colonic diverticulosis, without associated inflammatory changes.  No CT findings to account for the patient's abdominal pain.     Medications: Scheduled Meds:   . aspirin  81 mg Oral Daily  . bisacodyl  5 mg Oral BID  . carvedilol  3.125 mg Oral BID WC  . docusate sodium  100 mg Oral BID  . furosemide  40 mg Oral Q24H  . heparin  5,000 Units Subcutaneous Q8H  . polyethylene glycol  17 g Oral Daily  . potassium chloride SA  20 mEq Oral Daily  . white petrolatum       Continuous Infusions:  PRN Meds:.acetaminophen, acetaminophen, chlorpheniramine-HYDROcodone, ondansetron (ZOFRAN) IV, ondansetron, pseudoephedrine, senna, sodium chloride, traMADol  Assessment/Plan:  Principal Problem:  *Hematochezia - now resolved, pt's Hg/Hct remain stable and at pt's baseline  Active Problems:  HYPERTENSION - well controlled to this point   CONSTIPATION, CHRONIC - continue bowel regimen   Systolic and diastolic CHF, chronic - clinically compensated, will obtain CXR in AM   Thrombocytosis - unclear etiology, remains at  pt's baseline   Hyponatremia - improving and remains at pt's baseline   Abdominal pain - persistent and unclear etiology at this time, CT abd and pelvis not suggestive of acute findings and pt has been having BM as reports by attending nurse. Will obtain GI consultation in AM and will discuss if inpatient or outpatient follow up indicated. Pt remains hemodynamically stable and has not had any blood in stool per her report. Hg/Hct remain stable as noted in LAB's section.    LOS: 4 days   [name] 12/28/2010, 10:58 PM

## 2010-12-28 NOTE — Progress Notes (Signed)
Physical Therapy Treatment Patient Details Name: [name] MRN: [id number] DOB: 09-26-1934 Today's Date: 12/28/2010  PT Cancellation Note  ___Treatment cancelled today due to medical issues with patient which prohibited therapy  ___ Treatment cancelled today due to patient receiving procedure or test   _x__ Treatment cancelled today due to patient's refusal to participate.  Patient reports she just got back into bed and has been walking in the room (to the bathroom) with the RW.  I emphasized the importance of walking into the hallway for her strength.  She did not want to try right now.  Pt. Is agreeable for PT to check back tomorrow.    ___ Treatment cancelled today due to   Signature: Denis Carreon B. Seva Chancy, PT, DPT [phone]   12/28/2010, 3:22 PM

## 2010-12-29 ENCOUNTER — Observation Stay (HOSPITAL_COMMUNITY): Payer: Medicare Other

## 2010-12-29 LAB — CBC
HCT: 36.3 % (ref 36.0–46.0)
Hemoglobin: 12.4 g/dL (ref 12.0–15.0)
MCHC: 34.2 g/dL (ref 30.0–36.0)
MCV: 89 fL (ref 78.0–100.0)
RDW: 13.4 % (ref 11.5–15.5)
WBC: 1.5 10*3/uL — ABNORMAL LOW (ref 4.0–10.5)

## 2010-12-29 LAB — GLUCOSE, CAPILLARY: Glucose-Capillary: 107 mg/dL — ABNORMAL HIGH (ref 70–99)

## 2010-12-29 LAB — BASIC METABOLIC PANEL
BUN: 15 mg/dL (ref 6–23)
Chloride: 91 mEq/L — ABNORMAL LOW (ref 96–112)
Creatinine, Ser: 0.88 mg/dL (ref 0.50–1.10)
GFR calc Af Amer: 73 mL/min — ABNORMAL LOW (ref >90)
Glucose, Bld: 126 mg/dL — ABNORMAL HIGH (ref 70–99)

## 2010-12-29 LAB — URINE CULTURE

## 2010-12-29 NOTE — Progress Notes (Signed)
PT NOW PRESENTS WITH WBC OF 1.0 AND WE ARE AWAITING GI CONSULT. UTILIZATION REVIEW COMPLETE Willa Rough [phone] OR [phone] 12/29/2010

## 2010-12-29 NOTE — Progress Notes (Signed)
Patient was discussed during progression meeting. MD stated patient changed her mind and wanted to return home with [facility]. CSW met with patient to discuss SNF vs HH. Patient reported she had discussed the options with her dtr and decided to return home. CSW reminded patient that if she changed her mind then CSW could be reconsulted. CSW placed FL2 in chart in case patient chooses SNF. CSW is signing off but is available if CSW needs arise or if patient chooses SNF. Unk Lightning [phone] (covering for [employer])

## 2010-12-29 NOTE — Progress Notes (Signed)
Pt unable to work with therapy again this afternoon.  Of note, I noticed since last Friday 12/25/10 when we last worked together, that the patient's face has progressively gotten more and more red and swollen.  I spoke with the RN who reports that the only new medication that the patient is taking is the eye drops.  Could the eye drops be causing some kind of allergic reaction that is causing all of the facial redness and swelling around the eyes, cheeks and lips?  I did speak with the MD who just stopped in the room and is planning to address this issue.    PT to continue to follow acutely and progress mobility as able.   Thanks, Rollene Rotunda. Antoney Biven, PT, DPT [phone]

## 2010-12-29 NOTE — Progress Notes (Signed)
Patient ID: [name], female   DOB: 1935/02/02, 76 y.o.   MRN: [id number]  Subjective: No events overnight.   Objective:  Vital signs in last 24 hours:  Filed Vitals:   12/28/10 2133 12/29/10 0647 12/29/10 1500 12/29/10 2200  BP: 152/82 170/91 157/86 161/86  Pulse: 85 84 91 94  Temp: 98.8 F (37.1 C) 98.6 F (37 C) 98.6 F (37 C) 98.1 F (36.7 C)  TempSrc: Oral Oral Oral Oral  Resp: 20 20 20 20   Height:      Weight:      SpO2: 92% 93% 97% 97%    Intake/Output from previous day:   Intake/Output Summary (Last 24 hours) at 12/29/10 2305 Last data filed at 12/29/10 0830  Gross per 24 hour  Intake    360 ml  Output      0 ml  Net    360 ml    Physical Exam: General: Alert, awake, oriented x3, in no acute distress. HEENT: No bruits, no goiter. Heart: Regular rate and rhythm, without murmurs, rubs, gallops. Lungs: Clear to auscultation bilaterally. Abdomen: Soft, nontender, nondistended, positive bowel sounds. Extremities: No clubbing cyanosis or edema with positive pedal pulses. Neuro: Grossly intact, nonfocal.   Lab Results:  Basic Metabolic Panel:    Component Value Date/Time   NA 127* 12/29/2010 0604   K 3.7 12/29/2010 0604   CL 91* 12/29/2010 0604   CO2 22 12/29/2010 0604   BUN 15 12/29/2010 0604   CREATININE 0.88 12/29/2010 0604   GLUCOSE 126* 12/29/2010 0604   CALCIUM 9.2 12/29/2010 0604   CBC:    Component Value Date/Time   WBC 1.5* 12/29/2010 0604   HGB 12.4 12/29/2010 0604   HCT 36.3 12/29/2010 0604   PLT 585* 12/29/2010 0604   MCV 89.0 12/29/2010 0604   NEUTROABS 8.1* 10/02/2010 0650   LYMPHSABS 2.4 10/02/2010 0650   MONOABS 0.9 10/02/2010 0650   EOSABS 0.1 10/02/2010 0650   BASOSABS 0.0 10/02/2010 0650    Recent Results (from the past 240 hour(s))  MRSA PCR SCREENING     Status: Normal   Collection Time   12/25/10 11:03 AM      Component Value Range Status Comment   MRSA by PCR NEGATIVE  NEGATIVE  Final   URINE CULTURE     Status:  Normal   Collection Time   12/27/10  7:34 PM      Component Value Range Status Comment   Specimen Description URINE, RANDOM   Final    Special Requests NONE   Final    Setup Time [id number]   Final    Colony Count >=100,000 COLONIES/ML   Final    Culture     Final    Value: Multiple bacterial morphotypes present, none predominant. Suggest appropriate recollection if clinically indicated.   Report Status 12/29/2010 FINAL   Final     Studies/Results: Dg Chest 2 View  12/29/2010  *RADIOLOGY REPORT*  Clinical Data: Cough and congestion  CHEST - 2 VIEW  Comparison: 12/24/2010  Findings: Heart size appears normal.  No pleural effusion or pulmonary edema.  The lung volumes are decreased.  There is persistent bilateral perihilar opacities which may be related to infiltrates and/or atelectasis.  Review of the visualized osseous structures is unremarkable.  IMPRESSION:  1.  No significant change in bilateral perihilar opacities.  Original Report Authenticated By: Rosealee Albee, M.D.    Medications: Scheduled Meds:   . aspirin  81 mg Oral Daily  .  bisacodyl  5 mg Oral BID  . carvedilol  3.125 mg Oral BID WC  . docusate sodium  100 mg Oral BID  . furosemide  40 mg Oral Q24H  . heparin  5,000 Units Subcutaneous Q8H  . polyethylene glycol  17 g Oral Daily  . potassium chloride SA  20 mEq Oral Daily  . senna  2 tablet Oral BID   Continuous Infusions:  PRN Meds:.acetaminophen, acetaminophen, chlorpheniramine-HYDROcodone, ondansetron (ZOFRAN) IV, ondansetron, pseudoephedrine, sodium chloride, traMADol, DISCONTD: senna  Assessment/Plan:  Principal Problem:  *Hematochezia  - now resolved, pt's Hg/Hct remain stable and at pt's baseline  Active Problems:  HYPERTENSION  - well controlled during the hospitalization   CONSTIPATION, CHRONIC  - continue and ensure that pt has regular bowel regimen  Thrombocytosis  - unclear etiology, remains at pt's baseline   Hyponatremia  - pt's  baseline is 127-132 so even though drop noted overnight pt's sodium remains at baseline  Abdominal pain  - persistent and unclear etiology at this time, CT abd and pelvis not suggestive of acute findings and pt has been having BM as reported by attending nurse. Her pain is slightly improved compared to the admission but if persists consider GI consult. They were called initially upon admission but have recommended outpatient follow up. Pt is still hospitalized and official consult is appropriate if pain not resolved by tomorrow. Pt follows with Dr. Arlyce Dice ([facility]). Pt remains hemodynamically stable and has not had any blood in stool per her report. Hg/Hct remain stable as noted in LAB's section.   Leukopenia - unclear etiology - obtain CBC in AM to ensure no lab error   Disposition - anticipate d/c in 1-2 days based on pt's symptoms - she will go home with PT [facility] once ready, does not want to be placed at SNF - this is already arranged     LOS: 5 days   [name] 12/29/2010, 11:05 PM

## 2010-12-30 ENCOUNTER — Encounter (HOSPITAL_COMMUNITY): Payer: Self-pay | Admitting: Physician Assistant

## 2010-12-30 DIAGNOSIS — R1033 Periumbilical pain: Secondary | ICD-10-CM

## 2010-12-30 DIAGNOSIS — D72819 Decreased white blood cell count, unspecified: Secondary | ICD-10-CM | POA: Diagnosis not present

## 2010-12-30 DIAGNOSIS — K625 Hemorrhage of anus and rectum: Secondary | ICD-10-CM

## 2010-12-30 DIAGNOSIS — D709 Neutropenia, unspecified: Secondary | ICD-10-CM | POA: Diagnosis not present

## 2010-12-30 DIAGNOSIS — R600 Localized edema: Secondary | ICD-10-CM | POA: Diagnosis not present

## 2010-12-30 LAB — CBC
HCT: 35.6 % — ABNORMAL LOW (ref 36.0–46.0)
HCT: 35 % — ABNORMAL LOW (ref 36.0–46.0)
Hemoglobin: 11.9 g/dL — ABNORMAL LOW (ref 12.0–15.0)
Hemoglobin: 12.1 g/dL (ref 12.0–15.0)
MCV: 88.8 fL (ref 78.0–100.0)
MCV: 88.4 fL (ref 78.0–100.0)
RBC: 3.96 MIL/uL (ref 3.87–5.11)
RDW: 13.5 % (ref 11.5–15.5)
RDW: 13.4 % (ref 11.5–15.5)
WBC: 1.2 10*3/uL — CL (ref 4.0–10.5)
WBC: 1.4 10*3/uL — CL (ref 4.0–10.5)

## 2010-12-30 LAB — BASIC METABOLIC PANEL
BUN: 18 mg/dL (ref 6–23)
CO2: 24 mEq/L (ref 19–32)
Chloride: 95 mEq/L — ABNORMAL LOW (ref 96–112)
Creatinine, Ser: 0.9 mg/dL (ref 0.50–1.10)
Glucose, Bld: 105 mg/dL — ABNORMAL HIGH (ref 70–99)
Potassium: 4.2 mEq/L (ref 3.5–5.1)

## 2010-12-30 LAB — DIFFERENTIAL
Basophils Absolute: 0 10*3/uL (ref 0.0–0.1)
Basophils Relative: 0 % (ref 0–1)
Eosinophils Relative: 1 % (ref 0–5)
Lymphocytes Relative: 40 % (ref 12–46)
Lymphs Abs: 0.6 10*3/uL — ABNORMAL LOW (ref 0.7–4.0)
Monocytes Relative: 4 % (ref 3–12)
Neutro Abs: 0.7 10*3/uL — ABNORMAL LOW (ref 1.7–7.7)

## 2010-12-30 LAB — GLUCOSE, CAPILLARY: Glucose-Capillary: 144 mg/dL — ABNORMAL HIGH (ref 70–99)

## 2010-12-30 MED ORDER — METHYLPREDNISOLONE SODIUM SUCC 125 MG IJ SOLR
60.0000 mg | Freq: Two times a day (BID) | INTRAMUSCULAR | Status: DC
  Administered 2010-12-30: 60 mg via INTRAVENOUS
  Filled 2010-12-30: qty 2

## 2010-12-30 MED ORDER — DIPHENHYDRAMINE HCL 50 MG/ML IJ SOLN
12.5000 mg | Freq: Three times a day (TID) | INTRAMUSCULAR | Status: AC
  Administered 2010-12-30 – 2011-01-01 (×5): 12.5 mg via INTRAVENOUS
  Filled 2010-12-30: qty 0.25
  Filled 2010-12-30 (×4): qty 1
  Filled 2010-12-30: qty 0.25

## 2010-12-30 MED ORDER — METHYLPREDNISOLONE SODIUM SUCC 125 MG IJ SOLR: 60.0000 mg | Freq: Three times a day (TID) | INTRAMUSCULAR | Status: DC

## 2010-12-30 MED ORDER — PREDNISOLONE ACETATE 1 % OP SUSP
1.0000 [drp] | Freq: Three times a day (TID) | OPHTHALMIC | Status: DC
  Administered 2010-12-30 – 2011-01-03 (×11): 1 [drp] via OPHTHALMIC
  Filled 2010-12-30: qty 1

## 2010-12-30 MED ORDER — FAMOTIDINE IN NACL 20-0.9 MG/50ML-% IV SOLN
20.0000 mg | Freq: Two times a day (BID) | INTRAVENOUS | Status: DC
  Administered 2010-12-30 – 2011-01-03 (×9): 20 mg via INTRAVENOUS
  Filled 2010-12-30 (×10): qty 50

## 2010-12-30 MED ORDER — RANITIDINE NICU IV SYRINGE 25 MG/ML: 150.0000 mg | INJECTION | Freq: Three times a day (TID) | INTRAMUSCULAR | Status: DC

## 2010-12-30 MED ORDER — METHYLPREDNISOLONE SODIUM SUCC 125 MG IJ SOLR
60.0000 mg | Freq: Three times a day (TID) | INTRAMUSCULAR | Status: DC
  Administered 2010-12-30 – 2011-01-03 (×11): 60 mg via INTRAVENOUS
  Filled 2010-12-30: qty 0.96
  Filled 2010-12-30: qty 2
  Filled 2010-12-30 (×3): qty 0.96
  Filled 2010-12-30: qty 2
  Filled 2010-12-30 (×8): qty 0.96

## 2010-12-30 NOTE — Consult Note (Signed)
[facility] Consult: 12:29 PM 12/30/2010    Referring Provider: Ella Jubilee, triad hospitalist  Primary Care Physician: Maurice Small   Primary Gastroenterologist:  Dr. Melvia Heaps   Reason for Consultation:  Constipation and blood per rectum.  HPI: [name] is a 76 y.o. female.  Admitted 6 days ago c/o episodes of bleeding per rectum.  External hemorrhoids seen on rectal exam, no blood seen and FOB negative.  Was complaining of pain all over her body, which is not a new issue upon review of office notes.  Hematochezia has resolved and CBCs are stable.  Has periodic blood per rectum in minor volumes self treats with suppositories.  The bleeding on the 16th was larger in volume, though not so copious as to represent diverticular source.  CT scan abdomen and pelvis unrevealing as to source of pain. This was the third Abd CT this year, and had one in 08/2009: all to evaluate pain +/- nausea. Pt with longstanding constipation, well managed with Miralax and Bisacodyl.   Prior colonoscopy not located but said to have been done around 2007 or 2008 according to Dr. Marzetta Board office note of Sep. 2011.  It is said to have revealed diverticulosis.  Hematology following for Leukopenia and thrombocytosis.   As inpt developed facial swelling and hives type eruption of unclear cause that has led to c/o regurgitation, globus,  dysphagia.  Difficulty swallowing thick secretions which she coughs up.  Speech therapist has done BSS evaluation today.  Diet changed to Dysphagia 2 and they rec: Barium esophagram.  Pt did not have sign issues with regurg, dysphagia previously.    Past Medical History  Diagnosis Date  . HTN (hypertension)   . Diverticulosis   . Hemorrhoid   . Osteoarthritis   . Constipation, chronic   . UTI (lower urinary tract infection)   . H/O: hysterectomy   . Systolic and diastolic CHF, chronic     45%  . Female bladder prolapse     with prolapse  uterus, cystocele, s/p TVH/BSO  . Obesity (BMI 30-39.9)     wt. 94.1 kg 12/2010    Past Surgical History  Procedure Date  . Wrist surgery   . Vaginal hysterectomy,cystocele and prolapse  repair NOV. 2008    Prior to Admission medications   Medication Sig Start Date End Date Taking? Authorizing Provider  aspirin 81 MG tablet Take 81 mg by mouth daily.     Yes Historical Provider, MD  bisacodyl (DULCOLAX) 5 MG EC tablet Take 5 mg by mouth daily. For   Yes Historical Provider, MD  carvedilol (COREG) 3.125 MG tablet   09/04/10  Yes Rollene Rotunda, MD  conjugated estrogens (PREMARIN) vaginal cream Place 0.5 g vaginally daily.     Yes Historical Provider, MD  ferrous fumarate (FERRETTS) 325 (106 FE) MG TABS Take by mouth 2 (two) times daily.     Yes Historical Provider, MD  furosemide (LASIX) 40 MG tablet Take 40 mg by mouth daily.     Yes Historical Provider, MD  KLOR-CON M20 20 MEQ tablet take 2 tablets by mouth twice a day 11/15/10  Yes Rollene Rotunda, MD  lisinopril (PRINIVIL,ZESTRIL) 10 MG tablet take 1 tablet by mouth once daily 11/27/10  Yes Rollene Rotunda, MD  traMADol (ULTRAM) 50 MG tablet Take 50 mg by mouth every 6 (six) hours as needed. For pain   Yes Historical Provider, MD    Scheduled Meds:    . carvedilol  3.125 mg Oral BID WC  .  diphenhydrAMINE  12.5 mg Intravenous Q8H  . docusate sodium  100 mg Oral BID  . famotidine (PEPCID) IV  20 mg Intravenous Q12H  . furosemide  40 mg Oral Q24H  . potassium chloride SA  20 mEq Oral Daily  . DISCONTD: aspirin  81 mg Oral Daily  . DISCONTD: bisacodyl  5 mg Oral BID  . DISCONTD: heparin  5,000 Units Subcutaneous Q8H  . DISCONTD: polyethylene glycol  17 g Oral Daily  . DISCONTD: ranitidine  150 mg Intravenous Q8H  . DISCONTD: senna  2 tablet Oral BID   Infusions:   PRN Meds: acetaminophen, acetaminophen, sodium chloride, DISCONTD: chlorpheniramine-HYDROcodone, DISCONTD: ondansetron (ZOFRAN) IV, DISCONTD: ondansetron, DISCONTD:  pseudoephedrine, DISCONTD: traMADol   Allergies as of 12/24/2010 - Review Complete 12/24/2010  Allergen Reaction Noted  . Latex  09/09/2009  . Penicillins    . Tenivac      Family History  Problem Relation Age of Onset  . Diabetes    . Hypertension    . Leukemia      History   Social History  . Marital Status: Single    Spouse Name: N/A    Number of Children: N/A  . Years of Education: N/A   Occupational History  . Not on file.   Social History Main Topics  . Smoking status: Never Smoker   . Smokeless tobacco: Not on file  . Alcohol Use: No  . Drug Use: Not on file  . Sexually Active: Not on file   Other Topics Concern  . Not on file   Social History Narrative  . No narrative on file    REVIEW OF SYSTEMS: Constitutional:  "miserable".  Weighed 105 kg in August 2012, now 94.1 kg. ENT:  No nasal bleeding Pulm:  Dyspneic .  Productive cough, non bloody sputum CV:  No angina GU:  Urine incontinence.  No longer has pessary as priors kept falling out. GI:  As above Musc/Skeletal:  Hurts all over, chronic problem currently worse Heme:  No bleeding issues.    Neuro:  Some numbness of LEs Derm:  Facial edema, hives Endocrine:  No excessive thirst. Immunization:  11/2010 flu shot Travel:  None   PHYSICAL EXAM: Vital signs in last 24 hours: Temp:  [98.1 F (36.7 C)-98.6 F (37 C)] 98.4 F (36.9 C) (11/21 0600) Pulse Rate:  [91-96] 96  (11/21 0600) Resp:  [19-20] 19  (11/21 0600) BP: (147-161)/(83-86) 147/83 mmHg (11/21 0600) SpO2:  [91 %-97 %] 91 % (11/21 0600) Weight:  [94.1 kg (207 lb 7.3 oz)] 207 lb 7.3 oz (94.1 kg) (11/21 0600)  Last BM Date: 12/28/10  General: Obese AAF who is chronically ill looking and feeble.  She is complaining of pain all over Head:  Swollen face and lips  Eyes:  Swollen lids, no conjunctival pallor Ears:  diminished hearing  Nose:  No discharge Mouth:  Tongue discolored to beige.  Sores on swollen lips and tongue..  Neck:   Obese.  No mass, no JVD Lungs:  Diminished B, a few rales at bases Heart: RRR, s1 s2 audible, noMRG Abdomen:  Obese, soft, non distended, no mass, non tender.   Rectal: Small external "rrhoid that is not bleeding.  Soft brown stool is FOB negative. Musc/Skeltl: Very weak needs assistance to stand and transfer.  Yells out when nurse moves her legs. Extremities:  No pedal edema  Neurologic:  Oriented x 3.  No tremor. Not confused Skin:  No sores, just facial swelling Tattoos:  None seen  Psych: Unhappy, cooperative.  Intake/Output from previous day: 11/20 0701 - 11/21 0700 In: 480 [P.O.:480] Out: -  Intake/Output this shift:    LAB RESULTS:  Basename 12/30/10 0904 12/30/10 0610 12/29/10 0604  WBC 1.4* 1.2* 1.5*  HGB 12.1 11.9* 12.4  HCT 35.0* 35.6* 36.3  PLT 562* 535* 585*   BMET Lab Results  Component Value Date   NA 131* 12/30/2010   NA 127* 12/29/2010   NA 132* 12/27/2010   K 4.2 12/30/2010   K 3.7 12/29/2010   K 3.6 12/27/2010   CL 95* 12/30/2010   CL 91* 12/29/2010   CL 95* 12/27/2010   CO2 24 12/30/2010   CO2 22 12/29/2010   CO2 27 12/27/2010   GLUCOSE 105* 12/30/2010   GLUCOSE 126* 12/29/2010   GLUCOSE 88 12/27/2010   BUN 18 12/30/2010   BUN 15 12/29/2010   BUN 9 12/27/2010   CREATININE 0.90 12/30/2010   CREATININE 0.88 12/29/2010   CREATININE 0.72 12/27/2010   CALCIUM 9.1 12/30/2010   CALCIUM 9.2 12/29/2010   CALCIUM 9.3 12/27/2010   LFT Lab Results  Component Value Date   PROT 7.4 12/24/2010   PROT 5.5* 10/02/2010   PROT 5.4* 10/01/2010   ALBUMIN 3.2* 12/24/2010   ALBUMIN 2.7* 10/02/2010   ALBUMIN 2.7* 10/01/2010   AST 12 12/24/2010   AST 10 10/02/2010   AST 9 10/01/2010   ALT 7 12/24/2010   ALT 15 10/02/2010   ALT 15 10/01/2010   ALKPHOS 128* 12/24/2010   ALKPHOS 83 10/02/2010   ALKPHOS 81 10/01/2010   BILITOT 0.2* 12/24/2010   BILITOT 0.3 10/02/2010   BILITOT 0.3 10/01/2010   BILIDIR <0.1 03/09/2009   BILIDIR 0.1 01/23/2009   IBILI NOT  CALCULATED 03/09/2009   IBILI 0.4 01/23/2009   PT/INR Lab Results  Component Value Date   INR 1.22 03/04/2009   INR 1.08 01/20/2009     RADIOLOGY STUDIES: Dg Chest 2 View  12/29/2010  *RADIOLOGY REPORT*  Clinical Data: Cough and congestion  CHEST - 2 VIEW  Comparison: 12/24/2010  Findings: Heart size appears normal.  No pleural effusion or pulmonary edema.  The lung volumes are decreased.  There is persistent bilateral perihilar opacities which may be related to infiltrates and/or atelectasis.  Review of the visualized osseous structures is unremarkable.  IMPRESSION:  1.  No significant change in bilateral perihilar opacities.  Original Report Authenticated By: Rosealee Albee, M.D.    CT ABDOMEN AND PELVIS WITH CONTRAST 12/27/2010 Technique: Multidetector CT imaging of the abdomen and pelvis was  performed following the standard protocol during bolus  administration of intravenous contrast.  Contrast: OMNIPAQUE IOHEXOL 300 MG/ML IV SOLN  Comparison: 09/30/2010  Findings: Motion degraded images.  Mild patchy opacity / scarring at the lung bases.  Liver, spleen, pancreas, and adrenal jets are within normal limits.  Gallbladder is unremarkable. No intrahepatic or extrahepatic  ductal dilatation.  Bilateral renal cysts. No hydronephrosis.  No evidence of bowel obstruction. Colonic diverticulosis, without  associated inflammatory changes.  Atherosclerotic calcifications of the abdominal aorta and branch  vessels.  No abdominopelvic ascites.  No suspicious abdominopelvic lymphadenopathy.  Status post hysterectomy. Ovaries are unremarkable.  Bladder is within normal limits.  Tiny fat containing inguinal hernias.  Degenerative changes of the visualized thoracolumbar spine.  IMPRESSION:  Motion degraded images.  No evidence of bowel obstruction.  Colonic diverticulosis, without associated inflammatory changes.  No CT findings to account for the patient's abdominal pain.  Original Report Authenticated By: Charline Bills, M.D.  ENDOSCOPIC STUDIES: Prior Colonoscopy not located.  IMPRESSION: 1.  Limited hematochezia, acute on chronic,  Suspect source is hemorrhoids.  This can be worked up as an outpatient.  Can call Dr. Marzetta Board office to arrange, but currently no slots on schedule for next 2 to 3 weeks. 2.  Dysphagia, acute in setting of (angioedema?) facial swelling. 3.  Chronic constipation.  Managed pretty well at home with Bisacodyl and Miralax.  PO Iron does not help constipation so wonder if this could be reduced to Mon/Wed/Friday or once daily dosing at discharge given that she is not significantly anemic and MCV is normal 4.  Chronic global pain including abdominal. 5.  Thrombocytosis. 6.   Leukopenia, neutropenia.  PLAN: 1.  Esophagram?  Given that the dysphagia arose when she developed facial swelling, wonder if same process is effecting the esophagus?  If this is the case, may be prudent to hold off on esophagram and perform only if dysphagia persists despite resolution of edema.  2.  Continue IV Famotidine, coverage for both edema and reflux. 3. Does she need steroids?   LOS: 6 days   Jennye Moccasin  12/30/2010, 12:29 PM

## 2010-12-30 NOTE — Progress Notes (Addendum)
[name] ION:[id number],KGM:[id number] is a 76 y.o. female,  Outpatient Primary MD for the patient is No primary provider on file. Chief Complaint  Patient presents with  . Rectal Bleeding      12/30/2010   Subjective:   [name] today has, No headache, No chest pain, Mild generalised abdominal pain - No Nausea, No new weakness tingling or numbness, No Cough - SOB.    Objective:   Filed Vitals:   12/29/10 0647 12/29/10 1500 12/29/10 2200 12/30/10 0600  BP: 170/91 157/86 161/86 147/83  Pulse: 84 91 94 96  Temp: 98.6 F (37 C) 98.6 F (37 C) 98.1 F (36.7 C) 98.4 F (36.9 C)  TempSrc: Oral Oral Oral Oral  Resp: 20 20 20 19   Height:      Weight:    94.1 kg (207 lb 7.3 oz)  SpO2: 93% 97% 97% 91%   Wt Readings from Last 3 Encounters:  12/30/10 94.1 kg (207 lb 7.3 oz)  09/03/10 95.482 kg (210 lb 8 oz)  06/04/10 95.437 kg (210 lb 6.4 oz)    Intake/Output Summary (Last 24 hours) at 12/30/10 6440 Last data filed at 12/29/10 2300  Gross per 24 hour  Intake    120 ml  Output      0 ml  Net    120 ml   Exam Awake Alert, Oriented *3, No new F.N deficits, Normal affect, has ++ facial swelling and whelps all over, no tongue swelling,  [place].AT,PERRAL Supple Neck,No JVD, No cervical lymphadenopathy appriciated.  Symmetrical Chest wall movement, Good air movement bilaterally, CTAB RRR,No Gallops,Rubs or new Murmurs, No Parasternal Heave +ve B.Sounds, Abd Soft, Non tender, No organomegaly appriciated, No rebound -guarding or rigidity. No Cyanosis, Clubbing   Data Review  CBC  Lab 12/30/10 0610 12/29/10 0604 12/27/10 0710 12/26/10 0800 12/25/10 0652  WBC 1.2* 1.5* 4.5 4.5 5.0  HGB 11.9* 12.4 11.0* 10.9* 10.1*  HCT 35.6* 36.3 33.1* 32.5* 31.2*  PLT 535* 585* 540* 519* 528*  MCV 88.8 89.0 90.7 91.5 91.5  MCH 29.7 30.4 30.1 30.7 29.6  MCHC 33.4 34.2 33.2 33.5 32.4  RDW 13.5 13.4 13.4 13.4 13.5  LYMPHSABS -- -- -- -- --  MONOABS -- -- -- -- --  EOSABS -- -- -- -- --    BASOSABS -- -- -- -- --  BANDABS -- -- -- -- --    Lab 12/30/10 0610 12/29/10 0604 12/27/10 0710 12/26/10 0800 12/25/10 0652  NA 131* 127* 132* 135 130*  K 4.2 3.7 3.6 3.6 4.4  CL 95* 91* 95* 98 99  CO2 24 22 27 27 23   GLUCOSE 105* 126* 88 85 80  BUN 18 15 9 10 9   CREATININE 0.90 0.88 0.72 0.77 0.92  CALCIUM 9.1 9.2 9.3 9.1 8.8  MG -- -- -- -- --   No results found for this basename: INR:5,PROTIME:5 in the last 168 hours Cardiac markers: No results found for this basename: CK:3,CKMB:3,TROPONINI:3,MYOGLOBIN:3 in the last 168 hours No results found for this basename: POCBNP:3 in the last 168 hours Recent Results (from the past 240 hour(s))  MRSA PCR SCREENING     Status: Normal   Collection Time   12/25/10 11:03 AM      Component Value Range Status Comment   MRSA by PCR NEGATIVE  NEGATIVE  Final   URINE CULTURE     Status: Normal   Collection Time   12/27/10  7:34 PM      Component Value Range Status Comment  Specimen Description URINE, RANDOM   Final    Special Requests NONE   Final    Setup Time [id number]   Final    Colony Count >=100,000 COLONIES/ML   Final    Culture     Final    Value: Multiple bacterial morphotypes present, none predominant. Suggest appropriate recollection if clinically indicated.   Report Status 12/29/2010 FINAL   Final     Radiology Reports Dg Chest 2 View  12/29/2010  *RADIOLOGY REPORT*  Clinical Data: Cough and congestion  CHEST - 2 VIEW  Comparison: 12/24/2010  Findings: Heart size appears normal.  No pleural effusion or pulmonary edema.  The lung volumes are decreased.  There is persistent bilateral perihilar opacities which may be related to infiltrates and/or atelectasis.  Review of the visualized osseous structures is unremarkable.  IMPRESSION:  1.  No significant change in bilateral perihilar opacities.  Original Report Authenticated By: Rosealee Albee, M.D.   Scheduled Meds:   . carvedilol  3.125 mg Oral BID WC  . diphenhydrAMINE   12.5 mg Intravenous Q8H  . docusate sodium  100 mg Oral BID  . furosemide  40 mg Oral Q24H  . potassium chloride SA  20 mEq Oral Daily  . ranitidine  150 mg Intravenous Q8H  . DISCONTD: aspirin  81 mg Oral Daily  . DISCONTD: bisacodyl  5 mg Oral BID  . DISCONTD: heparin  5,000 Units Subcutaneous Q8H  . DISCONTD: polyethylene glycol  17 g Oral Daily  . DISCONTD: senna  2 tablet Oral BID   Continuous Infusions:  PRN Meds:.acetaminophen, acetaminophen, sodium chloride, DISCONTD: chlorpheniramine-HYDROcodone, DISCONTD: ondansetron (ZOFRAN) IV, DISCONTD: ondansetron, DISCONTD: pseudoephedrine, DISCONTD: traMADol  Assessment & Plan   Hematochezia  - now resolved, pt's Hg/Hct remain stable and at pt's baseline , monitor.     HYPERTENSION  - well controlled during the hospitalization   CONSTIPATION, CHRONIC , Gen Abd Pain - continue and ensure that pt has regular bowel regimen , still has Pain, will consult CT stable.will get GI Input.  Thrombocytosis  - unclear etiology, remains at pt's baseline   Hyponatremia  - pt's baseline is 127-132 , monitor as improving.  Leukopenia  - unclear etiology  - obtain diff with P.smear, called Dr Gwenyth Bouillon Haem called, neutropenic precautions  Generalized Facial swelling with Whelps -  Unclear etiology, ? Autoimmune phenomenon, minimized Meds, DC ASA, IV Benadryl ,steroids and Zantac, monitor, Airway stable for now, but will move to step down with close monitoring, will inform PCCM now.   DVT Prophylaxis    SCDs  See all Orders from today for further details

## 2010-12-30 NOTE — Progress Notes (Signed)
Physical Therapy Treatment Patient Details Name: MILCAH DULANY MRN: [id number] DOB: 1934/07/02 Today's Date: 12/30/2010  PT Assessment/Plan  PT - Assessment/Plan Comments on Treatment Session: Despite pain and swelling of face, rash on arms, patient was willing to participate with daughter's encouragement.  Pt. continues to want to go home and is refusing SNF as a d/c option.   PT Plan: Discharge plan remains appropriate PT Frequency: Min 3X/week Follow Up Recommendations: Home health PT Equipment Recommended: None recommended by PT PT Goals  Acute Rehab PT Goals PT Goal: Supine/Side to Sit - Progress: Progressing toward goal PT Transfer Goal: Sit to Stand/Stand to Sit - Progress: Progressing toward goal PT Goal: Ambulate - Progress: Progressing toward goal  PT Treatment Precautions/Restrictions  Precautions Precautions: Fall Restrictions Weight Bearing Restrictions: No Mobility (including Balance) Bed Mobility Supine to Sit: 1: +2 Total assist (patient 50%) Supine to Sit Details (indicate cue type and reason): assist of legs, trunk and hips to get EOB on compliant mattress. Sitting - Scoot to Edge of Bed: 1: +2 Total assist (patient 50%) Sitting - Scoot to Edge of Bed Details (indicate cue type and reason): assist to weight shift hips to EOB Transfers Sit to Stand: 4: Min assist;From elevated surface;With upper extremity assist;From bed;From toilet Sit to Stand Details (indicate cue type and reason): min assist from elevated bed, mod assist from lower 3-in-1.  Patient needed assist to get trunk up over weak legs.   Stand to Sit: 4: Min assist Stand to Sit Details: min assist to help control descent to sit.   Ambulation/Gait Ambulation/Gait Assistance: 4: Min assist Ambulation/Gait Assistance Details (indicate cue type and reason): min guard assist with RW to support patient for balance.  Ambulation Distance (Feet): 20 Feet Assistive device: Rolling walker Gait Pattern:  Step-through pattern;Shuffle  Balance Balance Assessed: Yes Static Standing Balance Static Standing - Balance Support: Bilateral upper extremity supported;During functional activity (while daughter was helping her bathe. ) Static Standing - Level of Assistance: 5: Stand by assistance (for safety secondary to patient weak and painful. ) Static Standing - Comment/# of Minutes: 8 mins to perform lower body bathing.   Exercise    End of Session PT - End of Session Equipment Utilized During Treatment: Gait belt (RW) Activity Tolerance: Patient limited by fatigue;Patient limited by pain Patient left: in chair;with call bell in reach;with family/visitor present (daughter present.  ) General Behavior During Session: Lethargic Cognition: WFL for tasks performed  Shaneika Rossa B. Avianna Moynahan, PT, DPT [phone]   12/30/2010, 1:32 PM

## 2010-12-30 NOTE — Progress Notes (Signed)
Speech Language/Pathology Clinical/Bedside Swallow Evaluation Patient Details  Name: [name] MRN: [id number] DOB: Apr 06, 1934 Today's Date: 12/30/2010  Past Medical History:  Past Medical History  Diagnosis Date  . HTN (hypertension)   . Diverticulosis   . Hemorrhoid   . Osteoarthritis   . Constipation, chronic   . UTI (lower urinary tract infection)   . H/O: hysterectomy   . Systolic and diastolic CHF, chronic     45%  . Female bladder prolapse     with prolapse uterus, cystocele, s/p TVH/BSO   Past Surgical History:  Past Surgical History  Procedure Date  . Wrist surgery     Assessment/Recommendations/Treatment Plan Suspected Esophageal Findings Suspected Esophageal Findings: Gagging;Globus sensation;Other (comment) (pt believed she would regurgitate food but did not. )  SLP Assessment Clinical Impression Statement: Pt presents with a wet vocal quality at baseline, likely due to presence to thick secretions in pharynx (per expectoration and pt complaint). Pt with mild oral dyspahgia due to missing dentition, reduced oral mobility due to pain and generalized weakness.  Believe pt to experience a primary esophageal dysphagia based on complaints of fullness and regurgitation of POs.  Would recommend pt be downgraded to a dysphagia 2 (minced) diet, continue thin lqiuds with esophageal precautions.  Strongly suggest pt undergo barium esophagram to evaluate esophageal function.  Risk for Aspiration: Mild Other Related Risk Factors: History of esophageal-related issues;Decreased management of secretions  Recommendations Recommended Consults: Consider GI evaluation;Consider esophageal assessment Solid Consistency: Dysphagia 2 (Fine chop) Liquid Consistency: Thin Liquid Administration via: Straw Medication Administration: Whole meds with liquid Supervision: Patient able to self feed Compensations: Slow rate;Small sips/bites;Follow solids with liquid Postural Changes and/or  Swallow Maneuvers: Upright 30-60 min after meal;Seated upright 90 degrees Oral Care Recommendations: Oral care QID Other Recommendations: Have oral suction available  Prognosis Prognosis for Safe Diet Advancement: Fair  Individuals Consulted Consulted and Agree with Results and Recommendations: Patient  Swallow Study Goals  SLP Swallowing Goals Patient will consume recommended diet without observed clinical signs of aspiration with: Modified independent assistance Swallow Study Goal #1 - Progress: Progressing toward goal Patient will utilize recommended strategies during swallow to increase swallowing safety with: Minimal assistance Swallow Study Goal #2 - Progress: Progressing toward goal [facility], [place] CCC-SLP [phone]  Claudine Mouton 12/30/2010,10:03 AM

## 2010-12-30 NOTE — Consult Note (Signed)
Reason for consult: Leukopenia Consulting MD: Dr. Arbutus Ped Date of Consultation: 12/30/2010   PRIMARY CARE DOCTOR: Gretta Arab. Valentina Lucks, MD, at [facility] at the [facility].  CARDIOLOGIST: Rollene Rotunda, MD, [facility]  OPHTHALMOLOGIST: Dr. Serafina Royals.  RHEUMATOLOGIST: Dr. Nickola Major    HPI: 76 y/o AAF with multiple medical issues listed below , including recent  leukocytosis with WBC of  21 with 90% neutrophils in the setting of toxic granulation  in Aug. 2012 , at which time she had a diagnosis of dense bilateral episcleritis versus scleritis in R eye  and a right lower labial vaginal  ulcer treated by multispeciaties . Of note, her counts up to July of 2011 had been in the range of   WBC 10.6, HGB 10.9, HCT 32.9, and PLT  count is 337.  WBC differential notable for neutrophils at 88% and  lymphocytes at 5%. Pt has had progressive debilitation, generalized pain, and was admitted on 11/16 with hematochezia felt to be 2nd to hemorrhoids since her H/H was stable and CT A/P was negative. WBC was 5.0 . Plts were elvevated in setting of dehydration at 528. Status complicated on 11/20 by progressive facial swelling, periorbital edema with erythema and discharge, oral ulcerations and subcutaneous nodularities in arms. Pt also complained of blood tinged sputum. Her WBC dropped to 1.5 , lowest 1.2  And recovering slightly to 1.4 today with ANC of 0.7. Absolute lymph 0.6 and mono 0.1. Smear review is  Pending at this time. Pt is afebrile. She does have some night sweats but no chills.  We were asked to see in consultation regarding these hematological abnormalities.  PMH: Past Medical History  Diagnosis Date  . HTN (hypertension)   . Diverticulosis   . Hemorrhoid   . Osteoarthritis   . Constipation, chronic   . UTI (lower urinary tract infection)   . H/O: hysterectomy   . Systolic and diastolic CHF, chronic     45%  . Female bladder prolapse     with prolapse uterus, cystocele, s/p TVH/BSO  . Obesity (BMI  30-39.9)     wt. 94.1 kg 12/2010   Recent Sinusitis  Surgeries: Past Surgical History  Procedure Date  . Wrist surgery   . Vaginal hysterectomy,cystocele and prolapse  repair NOV. 2008    Allergies:  Allergies  Allergen Reactions  . Latex   . Penicillins   . Tenivac     Medications:   Scheduled:    . carvedilol  3.125 mg Oral BID WC  . diphenhydrAMINE  12.5 mg Intravenous Q8H  . docusate sodium  100 mg Oral BID  . famotidine (PEPCID) IV  20 mg Intravenous Q12H  . furosemide  40 mg Oral Q24H  . potassium chloride SA  20 mEq Oral Daily  . DISCONTD: aspirin  81 mg Oral Daily  . DISCONTD: bisacodyl  5 mg Oral BID  . DISCONTD: heparin  5,000 Units Subcutaneous Q8H  . DISCONTD: polyethylene glycol  17 g Oral Daily  . DISCONTD: ranitidine  150 mg Intravenous Q8H  . DISCONTD: senna  2 tablet Oral BID   ZOX:[id number], acetaminophen, sodium chloride, DISCONTD: chlorpheniramine-HYDROcodone, DISCONTD: ondansetron (ZOFRAN) IV, DISCONTD: ondansetron, DISCONTD: pseudoephedrine, DISCONTD: traMADol  JYN:WGNFAO of Systems  Constitutional:No weight loss. Negative for fever, chills. Occasional night sweats . Positive for malaise/fatigue.  Eyes: Positive for blurred vision. R eye pain. No photophobia. Respiratory: Negative for cough,Positive for blood tinged sputum. and  Mild shortness of breath.  Cardiovascular:Negative for chest pain. GI: No nausea, vomiting, diarrhea.  Positiveconstipation. No change in bowel caliber. Positive  Hematochezia. Pt has a h/o diverticulosis per daughter's report. Regurgitation. Dysphagia II. GU: No blood in urine. No loss of urinary control. Recent UTI. Pt has a history of bladder prolapse. Skin: Negative for itching?hives. No petechia. No bruising Neurological: No headaches. No motor or sensory deficits.  Family History: Family History  Problem Relation Age of Onset  . Diabetes  father   . Hypertension    . Leukemia brother     Social History:   reports that she has never smoked. She does not have any smokeless tobacco history on file. She reports that she does not drink alcohol. Her drug history not on file. Pt is single, 6 children. 4 of them alive, no health issues. 1 son died with epileptic attack and 1 with CO inhalation.   Physical Exam   3 -year-old  AAF   in no acute distress  A. and O. X3. Chronically ill appearing.  HEENT: remarkable for bilateral periorbital edema, worse on the R upper lid, accompanied by some erythema. Sclera opaque. Positive clear drainage. Presence of frontal raised areas (cystic), PERRLA. Oral cavity with thrush . Several areas of mucositis and ulcerations in the lip mucosa. Lesions are flat.  Neck supple. no thyromegaly, no cervical or supraclavicular adenopathy  Lungs:distant sounds bilaterally . No wheezing, rhonchi or rales. No axillary masses. Breasts: not examined. Cardiac regular rate and rhythm normal S1-S2, no murmur , rubs or gallops Abdomen obese, soft nontender , bowel sounds x4  no hepatosplenomegaly GU/rectal: deferred. Extremities no clubbing cyanosis . 1-2+ edema.No bruising or petechial rash. Some raised, non colored subcutaneous areas, 1 cm- 2 cm around  arms. Neurologic A. and O. x3, no focal deficits     Labs: CBC  Lab  12/30/10 0610  12/29/10 0604  12/27/10 0710  12/26/10 0800  12/25/10 0652   WBC  1.2*  1.5*  4.5  4.5  5.0   HGB  11.9*  12.4  11.0*  10.9*  10.1*   HCT  35.6*  36.3  33.1*  32.5*  31.2*   PLT  535*  585*  540*  519*  528*   MCV  88.8  89.0  90.7  91.5  91.5   MCH  29.7  30.4  30.1  30.7  29.6   MCHC  33.4  34.2  33.2  33.5  32.4   RDW  13.5  13.4  13.4  13.4  13.5   LYMPHSABS  --  --  --  --  --   MONOABS  --  --  --  --  --   EOSABS  --  --  --  --  --   BASOSABS  --  --  --  --  --   BANDABS  --  --  --  --  --      CMP     Component Value Date/Time   NA 131* 12/30/2010 0610   K 4.2 12/30/2010 0610   CL 95* 12/30/2010 0610   CO2 24 12/30/2010  0610   GLUCOSE 105* 12/30/2010 0610   BUN 18 12/30/2010 0610   CREATININE 0.90 12/30/2010 0610   CALCIUM 9.1 12/30/2010 0610   PROT 7.4 12/24/2010 2208   ALBUMIN 3.2* 12/24/2010 2208   AST 12 12/24/2010 2208   ALT 7 12/24/2010 2208   ALKPHOS 128* 12/24/2010 2208   BILITOT 0.2* 12/24/2010 2208     LIPASE, BLOOD      Component Value  Range   Lipase 21  11 - 59 (U/L)  URINALYSIS, ROUTINE W REFLEX MICROSCOPIC      Component Value Range   Color, Urine YELLOW  YELLOW    Appearance CLEAR  CLEAR    Specific Gravity, Urine 1.009  1.005 - 1.030    pH 7.0  5.0 - 8.0    Glucose, UA NEGATIVE  NEGATIVE (mg/dL)   Hgb urine dipstick NEGATIVE  NEGATIVE    Bilirubin Urine NEGATIVE  NEGATIVE    Ketones, ur NEGATIVE  NEGATIVE (mg/dL)   Protein, ur NEGATIVE  NEGATIVE (mg/dL)   Urobilinogen, UA 0.2  0.0 - 1.0 (mg/dL)   Nitrite NEGATIVE  NEGATIVE    Leukocytes, UA NEGATIVE  NEGATIVE   POCT PREGNANCY, URINE      Component Value Range       OCCULT BLOOD, POC DEVICE      Component Value Range   Fecal Occult Bld POSITIVE        August labs:  C3 complement 103 normal.   C4  complement 29 normal.  Blood culture x2 no growth.  ANA negative.  TSH 2.161 normal.  T4 of 1.71 normal.  T3 of 2.0 slightly low.  RPR  nonreactive.  HIV test nonreactive.  CA-125, 7.4 normal.  CEA level was  0.8 normal.  CA19-9, 21.4 low.  P-ANCA positive 1.64, this was done at  an outside lab.    CT ABDOMEN AND PELVIS WITH CONTRAST 12/27/2010  Mild patchy opacity / scarring at the lung bases.  Liver, spleen, pancreas, and adrenal jets are within normal limits.  Gallbladder is unremarkable. No intrahepatic or extrahepatic  ductal dilatation. Bilateral renal cysts. No hydronephrosis.  No evidence of bowel obstruction. Colonic diverticulosis, without  associated inflammatory changes. Atherosclerotic calcifications of the abdominal aorta and branch vessels. No abdominopelvic ascites. No suspicious abdominopelvic lymphadenopathy.    Status post hysterectomy. Ovaries are unremarkable.  Bladder is within normal limits. Tiny fat containing inguinal hernias.  Degenerative changes of the visualized thoracolumbar spine.     A/P: 76 y.o. female asked to see for evaluation of leukopenia, in the setting of possible infection. Will check a smear and order a differential to the CBC drawn today. Dr. Arbutus Ped    is to see the patient following this consult with recommendations regarding diagnosis, treatment options and further workup studies.  Thank you for the referral.  [facility] E 12/30/2010 1:53 PM  Ms. Markie is seen and examined. I agree with the above note. The patient is a very pleasant 76 years old [place] American female who was admitted with lower GI bleed secondary to hemorrhoids. On admission the patient is white blood count was normal at 6.4. That was normal until 3 days ago. Yesterday the total white blood count was 1.5 and today 1.4 was AUC of 0.7. The patient also was started developing periorbital edema with some subcutaneous nodules in the upper extremities. She is currently afebrile. I did not see any significant change in her medications he lost few days to explain her neutropenia, and no recent exposure to tick or insect bites. The patient also has no history of recent exposure to chemicals or toxins. Her neutropenia is currently of unknown etiology.  Recommendation: #1 monitor white blood count and ANC on daily basis. #2 check blood culture to rule out underlying sepsis. #3 check serology for CMV, herpes zoster and HIV as well as hepatitis panel. #4 will start empiric antibiotics with Avelox or ciprofloxacin if the patient becomes febrile.  #  5 I would consider starting Neupogen 480 mcg subcutaneously on daily basis if the ANC below 500. #6 I don't see a need to perform a bone marrow biopsy and aspirate at this point unless the patient has persistent neutropenia for several days as the patient has normal leukocyte  count on admission.  I discussed my plan was that Mr. Brandenburger and she is in agreement with the current plan.  Thank you so much for allowing me to participate in the care of Ms. Arlana Pouch. I will continue to follow the patient with you and assist in her management.

## 2010-12-30 NOTE — Consult Note (Signed)
I have reviewed the above note, examined the patient and agree with plan of treatment.Pt has chronic constipation and low volume  Hematochezia. She is having hypersensitivity reaction  affecting her mucosal membranes. This is likely a separate problem from her GI symptoms. Her dysphagia is a new onset and likely related to the mucositis. Consider use of IV steroids short term to reverse the mucosal edema. As far as her GI tract is concerned she will do best on a Miralax regimen 17 gm  Po bid, and empirical topical steroid cream. Please call us prn.

## 2010-12-31 ENCOUNTER — Inpatient Hospital Stay (HOSPITAL_COMMUNITY): Payer: Medicare Other

## 2010-12-31 DIAGNOSIS — J189 Pneumonia, unspecified organism: Secondary | ICD-10-CM

## 2010-12-31 LAB — CBC
HCT: 35.7 % — ABNORMAL LOW (ref 36.0–46.0)
Platelets: 572 10*3/uL — ABNORMAL HIGH (ref 150–400)
RBC: 3.99 MIL/uL (ref 3.87–5.11)
RDW: 13.6 % (ref 11.5–15.5)
WBC: 2.4 10*3/uL — ABNORMAL LOW (ref 4.0–10.5)

## 2010-12-31 LAB — URINALYSIS, ROUTINE W REFLEX MICROSCOPIC
Ketones, ur: NEGATIVE mg/dL
Nitrite: NEGATIVE
Urobilinogen, UA: 1 mg/dL (ref 0.0–1.0)

## 2010-12-31 LAB — OSMOLALITY: Osmolality: 280 mOsm/kg (ref 275–300)

## 2010-12-31 LAB — DIFFERENTIAL
Basophils Absolute: 0 10*3/uL (ref 0.0–0.1)
Lymphocytes Relative: 22 % (ref 12–46)
Lymphs Abs: 0.5 10*3/uL — ABNORMAL LOW (ref 0.7–4.0)
Monocytes Absolute: 0.1 10*3/uL (ref 0.1–1.0)
Neutro Abs: 1.8 10*3/uL (ref 1.7–7.7)

## 2010-12-31 LAB — BASIC METABOLIC PANEL
CO2: 24 mEq/L (ref 19–32)
Chloride: 95 mEq/L — ABNORMAL LOW (ref 96–112)
Glucose, Bld: 141 mg/dL — ABNORMAL HIGH (ref 70–99)
Sodium: 130 mEq/L — ABNORMAL LOW (ref 135–145)

## 2010-12-31 LAB — EXPECTORATED SPUTUM ASSESSMENT W GRAM STAIN, RFLX TO RESP C

## 2010-12-31 LAB — GLUCOSE, CAPILLARY

## 2010-12-31 LAB — HIV ANTIBODY (ROUTINE TESTING W REFLEX): HIV: NONREACTIVE

## 2010-12-31 LAB — URINE MICROSCOPIC-ADD ON

## 2010-12-31 MED ORDER — SODIUM CHLORIDE 0.9 % IV SOLN
INTRAVENOUS | Status: AC
  Administered 2010-12-31: 10:00:00 via INTRAVENOUS

## 2010-12-31 MED ORDER — TRIAMCINOLONE ACETONIDE 0.1 % EX CREA
TOPICAL_CREAM | Freq: Three times a day (TID) | CUTANEOUS | Status: DC
  Administered 2010-12-31 (×3): via TOPICAL
  Administered 2011-01-01: 1 via TOPICAL
  Administered 2011-01-01 – 2011-01-09 (×25): via TOPICAL
  Administered 2011-01-09: 1 via TOPICAL
  Administered 2011-01-10 (×3): via TOPICAL
  Administered 2011-01-11: 1 via TOPICAL
  Administered 2011-01-11 – 2011-01-12 (×3): via TOPICAL
  Filled 2010-12-31 (×3): qty 15

## 2010-12-31 MED ORDER — ONDANSETRON HCL 4 MG/2ML IJ SOLN
4.0000 mg | Freq: Four times a day (QID) | INTRAMUSCULAR | Status: DC | PRN
  Administered 2010-12-31 – 2011-01-09 (×5): 4 mg via INTRAVENOUS
  Filled 2010-12-31 (×5): qty 2

## 2010-12-31 MED ORDER — HYDRALAZINE HCL 20 MG/ML IJ SOLN
5.0000 mg | Freq: Four times a day (QID) | INTRAMUSCULAR | Status: DC | PRN
  Administered 2010-12-31 – 2011-01-01 (×2): 5 mg via INTRAVENOUS
  Filled 2010-12-31 (×2): qty 0.25

## 2010-12-31 MED ORDER — AZITHROMYCIN 500 MG PO TABS
500.0000 mg | ORAL_TABLET | Freq: Every day | ORAL | Status: AC
  Administered 2010-12-31: 500 mg via ORAL
  Filled 2010-12-31: qty 1

## 2010-12-31 MED ORDER — AZITHROMYCIN 250 MG PO TABS
250.0000 mg | ORAL_TABLET | Freq: Every day | ORAL | Status: DC
  Administered 2011-01-01: 250 mg via ORAL
  Filled 2010-12-31: qty 1

## 2010-12-31 MED ORDER — COLCHICINE 0.6 MG PO TABS
0.6000 mg | ORAL_TABLET | Freq: Two times a day (BID) | ORAL | Status: DC
  Administered 2010-12-31 – 2011-01-12 (×24): 0.6 mg via ORAL
  Filled 2010-12-31 (×26): qty 1

## 2010-12-31 NOTE — Consult Note (Signed)
Date of Admission:  12/24/2010  Date of Consult:  12/31/2010  Reason for Consult:Possible stevens-johnsons Referring Physician: Dr. Chinita [name] is an 76 y.o. female with prior admission in August with  Multiple problems at that time including what was thought to be episcleritis, vaginal ulcers, leukocytosis. I do not clearly see that an answer was found for her complaints at that time. She was admitted a few days ago with blood per rectum as well as blood tinged sputum. During her hospital stay she has developed a profound neutropenia along with vesicular to bullous rash on her arms, intense periorbital edeam,  Perioral edema with ulcerations around lips. She is currently being treated with steroids, benadryl for possible stevens johnsons/alllergic reaction. She has multiple allergies including to PCN and latex. She did not receive antibiotics this admission., she should not have been handled with latex but this remains a possiblitity. We were consulted to assist in workup and consider ID causes of her condition  Past Medical History  Diagnosis Date  . HTN (hypertension)   . Diverticulosis   . Hemorrhoid   . Osteoarthritis   . Constipation, chronic   . UTI (lower urinary tract infection)   . H/O: hysterectomy   . Systolic and diastolic CHF, chronic     45%  . Female bladder prolapse     with prolapse uterus, cystocele, s/p TVH/BSO  . Obesity (BMI 30-39.9)     wt. 94.1 kg 12/2010    Past Surgical History  Procedure Date  . Wrist surgery   . Vaginal hysterectomy,cystocele and prolapse  repair NOV. 2008  ergies:   Allergies  Allergen Reactions  . Latex   . Penicillins   . Tenivac     Medications: I have reviewed the patient's current medications. Anti-infectives    None      Social History:  reports that she has never smoked. She does not have any smokeless tobacco history on file. She reports that she does not drink alcohol. Her drug history not on file.  Family  History  Problem Relation Age of Onset  . Diabetes Brother   . Hypertension Mother   . Leukemia Brother   . Cancer Brother   . Stroke Mother   . Diabetes Father     See HPI and prmiary teams notes otherwise negative on 12 point review  Blood pressure 168/88, pulse 91, temperature 98 F (36.7 C), temperature source Oral, resp. rate 23, height 5\' 1"  (1.549 m), weight 201 lb 4.5 oz (91.3 kg), SpO2 96.00%. General: alert oriented pleasant HEENT: periorbital edema, injected conjunctiva, no icterus, hyperpigmenation around eyes and cheeks, lips swollen with ulcers, no desquamation in mouth itself CCV: RRR no murmurs galllops or rubs GI: soft, minimally tender in epigasrtrum pos bowel sounds Pulm: clear to ausculationa bilaterally no wheezes rhonchi Neuro: nonfocal Skin: in additino to findings above has vesicles blisters on arms bilaterally with one area small bullous lesion on left arm  Results for orders placed during the hospital encounter of 12/24/10 (from the past 48 hour(s))  GLUCOSE, CAPILLARY     Status: Abnormal   Collection Time   12/29/10 12:26 PM      Component Value Range Comment   Glucose-Capillary 193 (*) 70 - 99 (mg/dL)    Comment 1 Notify RN     GLUCOSE, CAPILLARY     Status: Abnormal   Collection Time   12/29/10  4:44 PM      Component Value Range Comment  Glucose-Capillary 107 (*) 70 - 99 (mg/dL)   GLUCOSE, CAPILLARY     Status: Abnormal   Collection Time   12/29/10  9:57 PM      Component Value Range Comment   Glucose-Capillary 118 (*) 70 - 99 (mg/dL)    Comment 1 Documented in Chart      Comment 2 Notify RN     CBC     Status: Abnormal   Collection Time   12/30/10  6:10 AM      Component Value Range Comment   WBC 1.2 (*) 4.0 - 10.5 (K/uL)    RBC 4.01  3.87 - 5.11 (MIL/uL)    Hemoglobin 11.9 (*) 12.0 - 15.0 (g/dL)    HCT 40.9 (*) 81.1 - 46.0 (%)    MCV 88.8  78.0 - 100.0 (fL)    MCH 29.7  26.0 - 34.0 (pg)    MCHC 33.4  30.0 - 36.0 (g/dL)    RDW 91.4   78.2 - 95.6 (%)    Platelets 535 (*) 150 - 400 (K/uL)   BASIC METABOLIC PANEL     Status: Abnormal   Collection Time   12/30/10  6:10 AM      Component Value Range Comment   Sodium 131 (*) 135 - 145 (mEq/L)    Potassium 4.2  3.5 - 5.1 (mEq/L)    Chloride 95 (*) 96 - 112 (mEq/L)    CO2 24  19 - 32 (mEq/L)    Glucose, Bld 105 (*) 70 - 99 (mg/dL)    BUN 18  6 - 23 (mg/dL)    Creatinine, [name] 2.13  0.50 - 1.10 (mg/dL)    Calcium 9.1  8.4 - 10.5 (mg/dL)    GFR calc non Af Amer 61 (*) >90 (mL/min)    GFR calc Af Amer 71 (*) >90 (mL/min)   GLUCOSE, CAPILLARY     Status: Abnormal   Collection Time   12/30/10  6:40 AM      Component Value Range Comment   Glucose-Capillary 103 (*) 70 - 99 (mg/dL)    Comment 1 Documented in Chart      Comment 2 Notify RN     CBC     Status: Abnormal   Collection Time   12/30/10  9:04 AM      Component Value Range Comment   WBC 1.4 (*) 4.0 - 10.5 (K/uL) CRITICAL VALUE NOTED.  VALUE IS CONSISTENT WITH PREVIOUSLY REPORTED AND CALLED VALUE.   RBC 3.96  3.87 - 5.11 (MIL/uL)    Hemoglobin 12.1  12.0 - 15.0 (g/dL)    HCT 08.6 (*) 57.8 - 46.0 (%)    MCV 88.4  78.0 - 100.0 (fL)    MCH 30.6  26.0 - 34.0 (pg)    MCHC 34.6  30.0 - 36.0 (g/dL)    RDW 46.9  62.9 - 52.8 (%)    Platelets 562 (*) 150 - 400 (K/uL)   DIFFERENTIAL     Status: Abnormal   Collection Time   12/30/10  9:04 AM      Component Value Range Comment   Neutrophils Relative 55  43 - 77 (%)    Lymphocytes Relative 40  12 - 46 (%)    Monocytes Relative 4  3 - 12 (%)    Eosinophils Relative 1  0 - 5 (%)    Basophils Relative 0  0 - 1 (%)    Neutro Abs 0.7 (*) 1.7 - 7.7 (K/uL)    Lymphs Abs  0.6 (*) 0.7 - 4.0 (K/uL)    Monocytes Absolute 0.1  0.1 - 1.0 (K/uL)    Eosinophils Absolute 0.0  0.0 - 0.7 (K/uL)    Basophils Absolute 0.0  0.0 - 0.1 (K/uL)   GLUCOSE, CAPILLARY     Status: Abnormal   Collection Time   12/30/10 12:02 PM      Component Value Range Comment   Glucose-Capillary 111 (*) 70 -  99 (mg/dL)    Comment 1 Notify RN     HIV ANTIBODY (ROUTINE TESTING)     Status: Normal   Collection Time   12/30/10  7:53 PM      Component Value Range Comment   HIV NON REACTIVE  NON REACTIVE    RHEUMATOID FACTOR     Status: Abnormal   Collection Time   12/30/10  7:53 PM      Component Value Range Comment   Rheumatoid Factor 27 (*) <=14 (IU/mL)   GLUCOSE, CAPILLARY     Status: Abnormal   Collection Time   12/30/10 10:41 PM      Component Value Range Comment   Glucose-Capillary 144 (*) 70 - 99 (mg/dL)   URINALYSIS, ROUTINE W REFLEX MICROSCOPIC     Status: Abnormal   Collection Time   12/31/10  5:33 AM      Component Value Range Comment   Color, Urine YELLOW  YELLOW     Appearance HAZY (*) CLEAR     Specific Gravity, Urine 1.022  1.005 - 1.030     pH 5.5  5.0 - 8.0     Glucose, UA NEGATIVE  NEGATIVE (mg/dL)    Hgb urine dipstick TRACE (*) NEGATIVE     Bilirubin Urine SMALL (*) NEGATIVE     Ketones, ur NEGATIVE  NEGATIVE (mg/dL)    Protein, ur 30 (*) NEGATIVE (mg/dL)    Urobilinogen, UA 1.0  0.0 - 1.0 (mg/dL)    Nitrite NEGATIVE  NEGATIVE     Leukocytes, UA NEGATIVE  NEGATIVE    URINE MICROSCOPIC-ADD ON     Status: Normal   Collection Time   12/31/10  5:33 AM      Component Value Range Comment   Squamous Epithelial / LPF RARE  RARE     WBC, UA 0-2  <3 (WBC/hpf)    RBC / HPF 0-2  <3 (RBC/hpf)    Bacteria, UA RARE  RARE    CBC     Status: Abnormal   Collection Time   12/31/10  6:30 AM      Component Value Range Comment   WBC 2.4 (*) 4.0 - 10.5 (K/uL)    RBC 3.99  3.87 - 5.11 (MIL/uL)    Hemoglobin 12.0  12.0 - 15.0 (g/dL)    HCT 16.1 (*) 09.6 - 46.0 (%)    MCV 89.5  78.0 - 100.0 (fL)    MCH 30.1  26.0 - 34.0 (pg)    MCHC 33.6  30.0 - 36.0 (g/dL)    RDW 04.5  40.9 - 81.1 (%)    Platelets 572 (*) 150 - 400 (K/uL)   DIFFERENTIAL     Status: Abnormal   Collection Time   12/31/10  6:30 AM      Component Value Range Comment   Neutrophils Relative 76  43 - 77 (%)     Neutro Abs 1.8  1.7 - 7.7 (K/uL)    Lymphocytes Relative 22  12 - 46 (%)    Lymphs Abs 0.5 (*) 0.7 - 4.0 (  K/uL)    Monocytes Relative 3  3 - 12 (%)    Monocytes Absolute 0.1  0.1 - 1.0 (K/uL)    Eosinophils Relative 0  0 - 5 (%)    Eosinophils Absolute 0.0  0.0 - 0.7 (K/uL)    Basophils Relative 0  0 - 1 (%)    Basophils Absolute 0.0  0.0 - 0.1 (K/uL)   BASIC METABOLIC PANEL     Status: Abnormal   Collection Time   12/31/10  6:30 AM      Component Value Range Comment   Sodium 130 (*) 135 - 145 (mEq/L)    Potassium 4.8  3.5 - 5.1 (mEq/L)    Chloride 95 (*) 96 - 112 (mEq/L)    CO2 24  19 - 32 (mEq/L)    Glucose, Bld 141 (*) 70 - 99 (mg/dL)    BUN 25 (*) 6 - 23 (mg/dL)    Creatinine, [name] 1.61  0.50 - 1.10 (mg/dL)    Calcium 9.7  8.4 - 10.5 (mg/dL)    GFR calc non Af Amer 61 (*) >90 (mL/min)    GFR calc Af Amer 71 (*) >90 (mL/min)   GLUCOSE, CAPILLARY     Status: Abnormal   Collection Time   12/31/10  8:45 AM      Component Value Range Comment   Glucose-Capillary 142 (*) 70 - 99 (mg/dL)    Comment 1 Documented in Chart      Comment 2 Notify RN         Component Value Date/Time   SDES URINE, RANDOM 12/27/2010 1934   SPECREQUEST NONE 12/27/2010 1934   CULT Multiple bacterial morphotypes present, none predominant. Suggest appropriate recollection if clinically indicated. 12/27/2010 1934   REPTSTATUS 12/29/2010 FINAL 12/27/2010 1934   Dg Chest Port 1 View  12/31/2010  *RADIOLOGY REPORT*  Clinical Data: Cough.  Fever.  Neutropenia.  PORTABLE CHEST - 1 VIEW  Comparison: 12/29/2010  Findings: Interval worsening of bilateral perihilar air space disease is seen compared to prior exam.  Peripheral atelectasis or scarring in the midlung zones is unchanged.  Cardiomegaly is stable.  No pleural effusions identified.  IMPRESSION: Interval worsening of bilateral perihilar air space disease.  Original Report Authenticated By: Danae Orleans, M.D.     Recent Results (from the past 720 hour(s))    MRSA PCR SCREENING     Status: Normal   Collection Time   12/25/10 11:03 AM      Component Value Range Status Comment   MRSA by PCR NEGATIVE  NEGATIVE  Final   URINE CULTURE     Status: Normal   Collection Time   12/27/10  7:34 PM      Component Value Range Status Comment   Specimen Description URINE, RANDOM   Final    Special Requests NONE   Final    Setup Time [id number]   Final    Colony Count >=100,000 COLONIES/ML   Final    Culture     Final    Value: Multiple bacterial morphotypes present, none predominant. Suggest appropriate recollection if clinically indicated.   Report Status 12/29/2010 FINAL   Final    Impression/Recommendation  76 year old with vesicular bullous rash, periorbital edema, lip swelling and neutropenia without clear cause. Onset appears to be in the hospital though I wonder if this was brewing prior to admission given her blood tinged sputum and her hematochezia. Differential here should include Kathlen Brunswick, Anaphylactic reaction to drugs, latex, food, autoimmune  disease. Certainly her prior admission with vaginal ulcers, leukocytosis and episcleritis could suggest Behcets (although uveitis is more commmon optho finding with this disease) It is possible that infection such as with Mycoplasma or viral infection could have triggered this though I doubt it --I will go ahead and give her azithromycin for 5 days just in case we are dealing with mycoplasma --I will check cold agglutinins, esr, crp, hiv ab and rna --I agree with consult with Dermatology, ONcology and I would also bring Rheumatology back for help as well. Samantha [name] saw pt during last stay  I spent greater than 60 minutes with the patient including greater than 50% of time in face to face counsel of the patient and in coordination of their care.     Thank you so much for this interesting consult,   Samantha [name] 12/31/2010, 10:30 AM

## 2010-12-31 NOTE — Progress Notes (Signed)
Pt did not void this AM even after taking lasix. I asked pt if she feels the need to urinate and she said "no." She sat on the [facility] for at least 30 min, but did not urinate. MD was notified and an order was written for in/out cath. After bladder scan that shows urine >584 ml, pt then sat on the [facility] and voided 360 ml of urine. Consequently,  In/out cath not done. Thanks.

## 2010-12-31 NOTE — Progress Notes (Signed)
Called to start another new peripheral iv for pt ; previous site was leaking; pt with very poor peripheral access; has sores and blisters on both arms;  "it hurts" to place a tourniquet;  Able to r/s iv in pt's left hand, but pt would benefit from a central line (picc) as she is also having lab work drawn peripherally;  Please advise.  Thank you.

## 2010-12-31 NOTE — Progress Notes (Addendum)
[name] [id number] is a 76 y.o. female,  Outpatient Primary MD for the patient is No primary provider on file. Chief Complaint  Patient presents with  . Rectal Bleeding      12/31/2010   Subjective:   [name] today has, No headache, No chest pain, Mild generalised abdominal pain - No Nausea, No new weakness tingling or numbness, Mild Cough - No SOB.    Objective:   Filed Vitals:   12/31/10 0500 12/31/10 0600 12/31/10 0700 12/31/10 0802  BP: 160/83 168/83 145/88 168/88  Pulse: 80 82 93 91  Temp:  98.3 F (36.8 C)    TempSrc:      Resp: 22 15 23    Height:      Weight:      SpO2: 85% 95% 96%    Wt Readings from Last 3 Encounters:  12/31/10 91.3 kg (201 lb 4.5 oz)  09/03/10 95.482 kg (210 lb 8 oz)  06/04/10 95.437 kg (210 lb 6.4 oz)    Intake/Output Summary (Last 24 hours) at 12/31/10 0814 Last data filed at 12/31/10 0518  Gross per 24 hour  Intake    290 ml  Output    200 ml  Net     90 ml   Exam  Awake Alert, Oriented *3, No new F.N deficits, Normal affect, has ++ facial swelling and whelps all over, no tongue swelling, few oral ulcers. [place].AT, Supple Neck,No JVD, No cervical lymphadenopathy appriciated.  Symmetrical Chest wall movement, Good air movement bilaterally, CTAB RRR,No Gallops,Rubs or new Murmurs, No Parasternal Heave +ve B.Sounds, Abd Soft, Non tender, No organomegaly appriciated, No rebound -guarding or rigidity. No Cyanosis, Clubbing.    Data Review   CBC  Lab 12/31/10 0630 12/30/10 0904 12/30/10 0610 12/29/10 0604 12/27/10 0710  WBC 2.4* 1.4* 1.2* 1.5* 4.5  HGB 12.0 12.1 11.9* 12.4 11.0*  HCT 35.7* 35.0* 35.6* 36.3 33.1*  PLT 572* 562* 535* 585* 540*  MCV 89.5 88.4 88.8 89.0 90.7  MCH 30.1 30.6 29.7 30.4 30.1  MCHC 33.6 34.6 33.4 34.2 33.2  RDW 13.6 13.4 13.5 13.4 13.4  LYMPHSABS 0.5* 0.6* -- -- --  MONOABS 0.1 0.1 -- -- --  EOSABS 0.0 0.0 -- -- --  BASOSABS 0.0 0.0 -- -- --  BANDABS -- -- -- -- --    Lab  12/31/10 0630 12/30/10 0610 12/29/10 0604 12/27/10 0710 12/26/10 0800  NA 130* 131* 127* 132* 135  K 4.8 4.2 3.7 3.6 3.6  CL 95* 95* 91* 95* 98  CO2 24 24 22 27 27   GLUCOSE 141* 105* 126* 88 85  BUN 25* 18 15 9 10   CREATININE 0.90 0.90 0.88 0.72 0.77  CALCIUM 9.7 9.1 9.2 9.3 9.1  MG -- -- -- -- --   No results found for this basename: INR:5,PROTIME:5 in the last 168 hours Cardiac markers: No results found for this basename: CK:3,CKMB:3,TROPONINI:3,MYOGLOBIN:3 in the last 168 hours No results found for this basename: POCBNP:3 in the last 168 hours Recent Results (from the past 240 hour(s))  MRSA PCR SCREENING     Status: Normal   Collection Time   12/25/10 11:03 AM      Component Value Range Status Comment   MRSA by PCR NEGATIVE  NEGATIVE  Final   URINE CULTURE     Status: Normal   Collection Time   12/27/10  7:34 PM      Component Value Range Status Comment   Specimen Description URINE, RANDOM   Final  Special Requests NONE   Final    Setup Time [id number]   Final    Colony Count >=100,000 COLONIES/ML   Final    Culture     Final    Value: Multiple bacterial morphotypes present, none predominant. Suggest appropriate recollection if clinically indicated.   Report Status 12/29/2010 FINAL   Final     Radiology Reports Dg Chest Port 1 View  12/31/2010  *RADIOLOGY REPORT*  Clinical Data: Cough.  Fever.  Neutropenia.  PORTABLE CHEST - 1 VIEW  Comparison: 12/29/2010  Findings: Interval worsening of bilateral perihilar air space disease is seen compared to prior exam.  Peripheral atelectasis or scarring in the midlung zones is unchanged.  Cardiomegaly is stable.  No pleural effusions identified.  IMPRESSION: Interval worsening of bilateral perihilar air space disease.  Original Report Authenticated By: Danae Orleans, M.D.   Scheduled Meds:    . carvedilol  3.125 mg Oral BID WC  . diphenhydrAMINE  12.5 mg Intravenous Q8H  . docusate sodium  100 mg Oral BID  . famotidine  (PEPCID) IV  20 mg Intravenous Q12H  . furosemide  40 mg Oral Q24H  . methylPREDNISolone (SOLU-MEDROL) injection  60 mg Intravenous Q8H  . potassium chloride SA  20 mEq Oral Daily  . prednisoLONE acetate  1 drop Both Eyes TID  . DISCONTD: aspirin  81 mg Oral Daily  . DISCONTD: bisacodyl  5 mg Oral BID  . DISCONTD: methylPREDNISolone (SOLU-MEDROL) injection  60 mg Intravenous Q12H  . DISCONTD: methylPREDNISolone (SOLU-MEDROL) injection  60 mg Intravenous Q8H  . DISCONTD: methylPREDNISolone (SOLU-MEDROL) injection  60 mg Intravenous Q8H  . DISCONTD: polyethylene glycol  17 g Oral Daily  . DISCONTD: ranitidine  150 mg Intravenous Q8H  . DISCONTD: senna  2 tablet Oral BID   Continuous Infusions:    . sodium chloride     PRN Meds:.acetaminophen, acetaminophen, sodium chloride, DISCONTD: chlorpheniramine-HYDROcodone, DISCONTD: pseudoephedrine  Assessment & Plan   Hematochezia  - now resolved, pt's Hg/Hct remain stable and at pt's baseline , monitor.     HYPERTENSION  - well controlled during the hospitalization   CONSTIPATION, CHRONIC , Gen Abd Pain - continue and ensure that pt has regular bowel regimen , still has Pain, will consult CT stable.will get GI Input noted, stable.  Thrombocytosis  - unclear etiology, remains at pt's baseline   Hyponatremia  - pt's baseline is 127-132 , monitor as improving.   Leukopenia / Neutropenia Noted input from Dr Gwenyth Bouillon Haem , Neutropenia stable today, continue Neutropenic precautions, afebrile, CXR ? Perihilar infilterates mild cough, sputum studies, D/W Dr Synthia Innocent ID will see her, ? ABX need.   Generalized Facial swelling with Whelps - Now breaking a few open sores on oral mucosa.  Unclear etiology, ? Autoimmune phenomenon ? Early steven Johnsons, ? H/O Vasculitis (Bechet's) in the past - Derm Dr Swaziland called x3 on [phone] no reply left message, Dr Sw[place]iland called back will see her shortly.  minimized Meds, DC ASA, continue IV  Benadryl ,steroids and Zantac, added Po colchicine, monitor, Airway stable for now, but will move to step down with close monitoring, D/w PCCM x2 . Note called pt's Rhem MD Dr Nickola Major- not on call, called on call Rheum Dr Rennie Natter- she says she is not for call in the Hospital and is out of town, will try calling Rheum again in am.  Hold transfer - to [facility] D/W Dr Dell Ponto earlier.   Asp precautions  Hyponatremia - Ur and  Ser Osm, gentle IVF as PO intake poor, monitor.   DVT Prophylaxis    SCDs  See all Orders from today for further details

## 2011-01-01 ENCOUNTER — Inpatient Hospital Stay (HOSPITAL_COMMUNITY): Payer: Medicare Other

## 2011-01-01 LAB — C-REACTIVE PROTEIN: CRP: 15.38 mg/dL — ABNORMAL HIGH (ref <0.60)

## 2011-01-01 LAB — SEDIMENTATION RATE: Sed Rate: 94 mm/hr — ABNORMAL HIGH (ref 0–22)

## 2011-01-01 LAB — GLUCOSE, CAPILLARY: Glucose-Capillary: 139 mg/dL — ABNORMAL HIGH (ref 70–99)

## 2011-01-01 LAB — BASIC METABOLIC PANEL
CO2: 26 mEq/L (ref 19–32)
Chloride: 93 mEq/L — ABNORMAL LOW (ref 96–112)
Creatinine, Ser: 0.73 mg/dL (ref 0.50–1.10)
Potassium: 3.7 mEq/L (ref 3.5–5.1)
Sodium: 131 mEq/L — ABNORMAL LOW (ref 135–145)

## 2011-01-01 LAB — CBC
HCT: 38.1 % (ref 36.0–46.0)
RDW: 13.5 % (ref 11.5–15.5)
WBC: 5 10*3/uL (ref 4.0–10.5)

## 2011-01-01 LAB — DIFFERENTIAL
Basophils Absolute: 0 10*3/uL (ref 0.0–0.1)
Lymphocytes Relative: 18 % (ref 12–46)
Monocytes Absolute: 0.4 10*3/uL (ref 0.1–1.0)
Neutro Abs: 3.7 10*3/uL (ref 1.7–7.7)
Neutrophils Relative %: 74 % (ref 43–77)

## 2011-01-01 LAB — HIV ANTIBODY (ROUTINE TESTING W REFLEX): HIV: NONREACTIVE

## 2011-01-01 LAB — ANA: Anti Nuclear Antibody(ANA): NEGATIVE

## 2011-01-01 MED ORDER — SODIUM CHLORIDE 0.9 % IJ SOLN
10.0000 mL | INTRAMUSCULAR | Status: DC | PRN
  Administered 2011-01-02 – 2011-01-12 (×9): 10 mL

## 2011-01-01 MED ORDER — AZITHROMYCIN 500 MG PO TABS
500.0000 mg | ORAL_TABLET | Freq: Every day | ORAL | Status: AC
  Administered 2011-01-02 – 2011-01-04 (×3): 500 mg via ORAL
  Filled 2011-01-01 (×3): qty 1

## 2011-01-01 MED ORDER — WHITE PETROLATUM GEL
Status: AC
  Administered 2011-01-01: 23:00:00
  Filled 2011-01-01: qty 5

## 2011-01-01 MED ORDER — SODIUM CHLORIDE 0.9 % IJ SOLN
10.0000 mL | Freq: Two times a day (BID) | INTRAMUSCULAR | Status: DC
  Administered 2011-01-01 – 2011-01-10 (×13): 10 mL

## 2011-01-01 MED ORDER — CARVEDILOL 6.25 MG PO TABS
6.2500 mg | ORAL_TABLET | Freq: Two times a day (BID) | ORAL | Status: DC
  Administered 2011-01-01 – 2011-01-12 (×21): 6.25 mg via ORAL
  Filled 2011-01-01 (×25): qty 1

## 2011-01-01 MED ORDER — AMLODIPINE BESYLATE 10 MG PO TABS
10.0000 mg | ORAL_TABLET | Freq: Every day | ORAL | Status: DC
  Administered 2011-01-01 – 2011-01-12 (×12): 10 mg via ORAL
  Filled 2011-01-01 (×12): qty 1

## 2011-01-01 MED ORDER — SODIUM CHLORIDE 0.9 % IV SOLN
INTRAVENOUS | Status: AC
  Administered 2011-01-01: 09:00:00 via INTRAVENOUS

## 2011-01-01 MED ORDER — METOPROLOL TARTRATE 50 MG PO TABS
50.0000 mg | ORAL_TABLET | Freq: Two times a day (BID) | ORAL | Status: DC
  Filled 2011-01-01 (×2): qty 1

## 2011-01-01 NOTE — Progress Notes (Signed)
Subjective: NOTE DR. Sw[place]iland DID SEE THE PT YESTERDAY WHEN I WAS ON 2600. BECAUSE OF EPIC PROBLEMS SHE WAS NOT ABLE TO ENTER HER NOTE INTO INPATIENT LOG. I am also having trouble finding it. She had said to me this was either allergic (poss latex allergy) not typical for SJ but could be and also agreed about poss of AI disease underlying this process  Pt co more swelling in her face today  Objective: Weight change: -10.6 oz (-0.3 kg)  Intake/Output Summary (Last 24 hours) at 01/01/11 1041 Last data filed at 01/01/11 1020  Gross per 24 hour  Intake    340 ml  Output   1010 ml  Net   -670 ml   Blood pressure 150/78, pulse 83, temperature 98.1 F (36.7 C), temperature source Oral, resp. rate 22, height 5\' 1"  (1.549 m), weight 200 lb 9.9 oz (91 kg), SpO2 98.00%. Temp:  [98.1 F (36.7 C)-98.6 F (37 C)] 98.1 F (36.7 C) (11/23 0800) Pulse Rate:  [77-106] 83  (11/23 0400) Resp:  [22-25] 22  (11/23 0400) BP: (150-186)/(61-98) 150/78 mmHg (11/23 1021) SpO2:  [94 %-98 %] 98 % (11/23 0026) Weight:  [200 lb 9.9 oz (91 kg)] 200 lb 9.9 oz (91 kg) (11/23 0022)  Physical Exam: General: alert oriented pleasant  HEENT: periorbital edema, injected conjunctiva, no icterus, hyperpigmenation around eyes and cheeks, lips swollen with ulcers, no desquamation in mouth itself  CCV: RRR no murmurs galllops or rubs  GI: soft, minimally tender in epigasrtrum pos bowel sounds  Pulm: clear to ausculationa bilaterally no wheezes rhonchi  Neuro: nonfocal  Skin: in additino to findings above has vesicles blisters on arms bilaterally with one area small bullous lesion on left arm  Lab Results:  Basename 01/01/11 0600 12/31/10 0630  WBC 5.0 2.4*  HGB 12.7 12.0  HCT 38.1 35.7*  PLT 629* 572*   BMET  Basename 01/01/11 0600 12/31/10 0630  NA 131* 130*  K 3.7 4.8  CL 93* 95*  CO2 26 24  GLUCOSE 139* 141*  BUN 25* 25*  CREATININE 0.73 0.90  CALCIUM 9.5 9.7    Micro Results: Recent Results (from the  past 240 hour(s))  MRSA PCR SCREENING     Status: Normal   Collection Time   12/25/10 11:03 AM      Component Value Range Status Comment   MRSA by PCR NEGATIVE  NEGATIVE  Final   URINE CULTURE     Status: Normal   Collection Time   12/27/10  7:34 PM      Component Value Range Status Comment   Specimen Description URINE, RANDOM   Final    Special Requests NONE   Final    Setup Time [id number]   Final    Colony Count >=100,000 COLONIES/ML   Final    Culture     Final    Value: Multiple bacterial morphotypes present, none predominant. Suggest appropriate recollection if clinically indicated.   Report Status 12/29/2010 FINAL   Final   PATHOLOGIST SMEAR REVIEW     Status: Normal   Collection Time   12/30/10  9:04 AM      Component Value Range Status Comment   Tech Review MARKED ABSOLUTE NEUTROPENIA WITHOUT   Final   CULTURE, SPUTUM-ASSESSMENT     Status: Normal   Collection Time   12/31/10  1:31 PM      Component Value Range Status Comment   Specimen Description SPUTUM   Final    Special Requests  NONE   Final    Sputum evaluation     Final    Value: MICROSCOPIC FINDINGS SUGGEST THAT THIS SPECIMEN IS NOT REPRESENTATIVE OF LOWER RESPIRATORY SECRETIONS. PLEASE RECOLLECT.     CALLED TO A AFATFAWO, RN 12/31/10 1413 BY K SCHULTZ   Report Status 12/31/2010 FINAL   Final     Studies/Results: Dg Chest 2 View  12/29/2010  *RADIOLOGY REPORT*  Clinical Data: Cough and congestion  CHEST - 2 VIEW  Comparison: 12/24/2010  Findings: Heart size appears normal.  No pleural effusion or pulmonary edema.  The lung volumes are decreased.  There is persistent bilateral perihilar opacities which may be related to infiltrates and/or atelectasis.  Review of the visualized osseous structures is unremarkable.  IMPRESSION:  1.  No significant change in bilateral perihilar opacities.  Original Report Authenticated By: Rosealee Albee, M.D.   Dg Chest 2 View  12/25/2010  *RADIOLOGY REPORT*  Clinical Data:  76 year old female with cough, shortness of breath, pain, bright red blood per rectum.  CHEST - 2 VIEW  Comparison: 09/28/2010 and earlier.  Findings: Stable cardiomegaly and mediastinal contours.  Hilar prominence is stable and probably relates to central vascular enlargement.  The lungs are clear. No acute osseous abnormality identified.  IMPRESSION: Stable cardiomegaly.  No acute cardiopulmonary abnormality.  Original Report Authenticated By: Harley Hallmark, M.D.   Ct Abdomen Pelvis W Contrast  12/27/2010  *RADIOLOGY REPORT*  Clinical Data: Lower abdominal pain, constipation, history of diverticulosis, bladder prolapse, and prior hysterectomy  CT ABDOMEN AND PELVIS WITH CONTRAST  Technique:  Multidetector CT imaging of the abdomen and pelvis was performed following the standard protocol during bolus administration of intravenous contrast.  Contrast: OMNIPAQUE IOHEXOL 300 MG/ML IV SOLN  Comparison: 09/30/2010  Findings: Motion degraded images.  Mild patchy opacity / scarring at the lung bases.  Liver, spleen, pancreas, and adrenal jets are within normal limits.  Gallbladder is unremarkable.  No intrahepatic or extrahepatic ductal dilatation.  Bilateral renal cysts.  No hydronephrosis.  No evidence of bowel obstruction.  Colonic diverticulosis, without associated inflammatory changes.  Atherosclerotic calcifications of the abdominal aorta and branch vessels.  No abdominopelvic ascites.  No suspicious abdominopelvic lymphadenopathy.  Status post hysterectomy.  Ovaries are unremarkable.  Bladder is within normal limits.  Tiny fat containing inguinal hernias.  Degenerative changes of the visualized thoracolumbar spine.  IMPRESSION: Motion degraded images.  No evidence of bowel obstruction.  Colonic diverticulosis, without associated inflammatory changes.  No CT findings to account for the patient's abdominal pain.  Original Report Authenticated By: Charline Bills, M.D.   Dg Chest Port 1  View  12/31/2010  *RADIOLOGY REPORT*  Clinical Data: Cough.  Fever.  Neutropenia.  PORTABLE CHEST - 1 VIEW  Comparison: 12/29/2010  Findings: Interval worsening of bilateral perihilar air space disease is seen compared to prior exam.  Peripheral atelectasis or scarring in the midlung zones is unchanged.  Cardiomegaly is stable.  No pleural effusions identified.  IMPRESSION: Interval worsening of bilateral perihilar air space disease.  Original Report Authenticated By: Danae Orleans, M.D.   Dg Abd 2 Views  12/25/2010  *RADIOLOGY REPORT*  Clinical Data: 77 year old female with abdominal pain.  Bright red blood from rectum.  ABDOMEN - 2 VIEW  Comparison: CT abdomen pelvis 09/30/2010 and earlier.  Findings: Supine and left-side down lateral decubitus views.  No pneumoperitoneum. Nonobstructed bowel gas pattern.  Lung bases appear normal. No acute osseous abnormality identified.  Chronic pelvic phleboliths.  IMPRESSION: Nonobstructed bowel  gas pattern, no free air.  Original Report Authenticated By: Harley Hallmark, M.D.    Antibiotics:  Anti-infectives     Start     Dose/Rate Route Frequency Ordered Stop   01/01/11 1000   azithromycin (ZITHROMAX) tablet 250 mg        250 mg Oral Daily 12/31/10 1051 01/05/11 0959   12/31/10 1200   azithromycin (ZITHROMAX) tablet 500 mg        500 mg Oral Daily 12/31/10 1051 12/31/10 1256          Medications: Scheduled Meds:   . amLODipine  10 mg Oral Daily  . azithromycin  500 mg Oral Daily   Followed by  . azithromycin  250 mg Oral Daily  . carvedilol  6.25 mg Oral BID WC  . colchicine  0.6 mg Oral BID  . diphenhydrAMINE  12.5 mg Intravenous Q8H  . docusate sodium  100 mg Oral BID  . famotidine (PEPCID) IV  20 mg Intravenous Q12H  . methylPREDNISolone (SOLU-MEDROL) injection  60 mg Intravenous Q8H  . potassium chloride SA  20 mEq Oral Daily  . prednisoLONE acetate  1 drop Both Eyes TID  . sodium chloride  10 mL Intracatheter Q12H  . triamcinolone  cream   Topical TID  . DISCONTD: carvedilol  3.125 mg Oral BID WC  . DISCONTD: furosemide  40 mg Oral Q24H  . DISCONTD: metoprolol tartrate  50 mg Oral BID   Continuous Infusions:   . sodium chloride 50 mL/hr at 12/31/10 1011  . sodium chloride 75 mL/hr at 01/01/11 0848   PRN Meds:.acetaminophen, acetaminophen, ondansetron (ZOFRAN) IV, sodium chloride, sodium chloride, DISCONTD: hydrALAZINE  Assessment/Plan: KENSLIE ABBRUZZESE is a 76 y.o. female  with vesicular bullous rash, periorbital edema, lip swelling and neutropenia without clear cause. Onset appears to be in the hospital though I wonder if this was brewing prior to admission given her blood tinged sputum and her hematochezia. Differential here should include Kathlen Brunswick, Anaphylactic reaction to drugs, latex, food, autoimmune disease. Certainly her prior admission with vaginal ulcers, leukocytosis and episcleritis could suggest Behcets (although uveitis is more commmon optho finding with this disease)  It is possible that infection such as with Mycoplasma or viral infection could have triggered this though I doubt it   Her HIV is again neg. ESR is high, infiltrate on CXR look worse (though Im not sure they really represent infection--I doubt it)  --will continue the azihromycin in case we are dealing with mycoplasma, or another atypical. I will also check a legionella ag  --cold agglutinins pending  --I agree with consult with Dermatology, ONcology and I would also bring Rheumatology back for help as well. Jule Ser saw pt during last stay --I DO NOT THINK THAT THIS is a primary ID problem  LOS: 8 days   Acey Lav 01/01/2011, 10:41 AM

## 2011-01-01 NOTE — Progress Notes (Addendum)
[name] [id number] is a 76 y.o. female,  Outpatient Primary MD for the patient is No primary provider on file. Chief Complaint  Patient presents with  . Rectal Bleeding      01/01/2011   Subjective:   [name] today has, No headache, No chest pain, Mild generalised abdominal pain - No Nausea, No new weakness tingling or numbness, Mild Cough - No SOB.    Objective:   Filed Vitals:   01/01/11 0022 01/01/11 0026 01/01/11 0400 01/01/11 0411  BP:  184/91 178/84   Pulse:  84 83   Temp: 98.6 F (37 C) 98.6 F (37 C) 98.6 F (37 C) 98.6 F (37 C)  TempSrc: Oral Oral Oral Oral  Resp:  22 22   Height:      Weight: 91 kg (200 lb 9.9 oz)     SpO2:  98%     Wt Readings from Last 3 Encounters:  01/01/11 91 kg (200 lb 9.9 oz)  09/03/10 95.482 kg (210 lb 8 oz)  06/04/10 95.437 kg (210 lb 6.4 oz)    Intake/Output Summary (Last 24 hours) at 01/01/11 0810 Last data filed at 01/01/11 0700  Gross per 24 hour  Intake    500 ml  Output    910 ml  Net   -410 ml   Exam  Awake Alert, Oriented *3, No new F.N deficits, Normal affect, has reduced facial swelling and whelps all over, no tongue swelling, few oral ulcers. [place].AT, Supple Neck,No JVD, No cervical lymphadenopathy appriciated.  Symmetrical Chest wall movement, Good air movement bilaterally, CTAB RRR,No Gallops,Rubs or new Murmurs, No Parasternal Heave +ve B.Sounds, Abd Soft, Non tender, No organomegaly appriciated, No rebound -guarding or rigidity. No Cyanosis, Clubbing.    Data Review   CBC  Lab 01/01/11 0600 12/31/10 0630 12/30/10 0904 12/30/10 0610 12/29/10 0604  WBC 5.0 2.4* 1.4* 1.2* 1.5*  HGB 12.7 12.0 12.1 11.9* 12.4  HCT 38.1 35.7* 35.0* 35.6* 36.3  PLT 629* 572* 562* 535* 585*  MCV 89.0 89.5 88.4 88.8 89.0  MCH 29.7 30.1 30.6 29.7 30.4  MCHC 33.3 33.6 34.6 33.4 34.2  RDW 13.5 13.6 13.4 13.5 13.4  LYMPHSABS 0.9 0.5* 0.6* -- --  MONOABS 0.4 0.1 0.1 -- --  EOSABS 0.0 0.0 0.0 -- --  BASOSABS  0.0 0.0 0.0 -- --  BANDABS -- -- -- -- --    Lab 01/01/11 0600 12/31/10 0630 12/30/10 0610 12/29/10 0604 12/27/10 0710  NA 131* 130* 131* 127* 132*  K 3.7 4.8 4.2 3.7 3.6  CL 93* 95* 95* 91* 95*  CO2 26 24 24 22 27   GLUCOSE 139* 141* 105* 126* 88  BUN 25* 25* 18 15 9   CREATININE 0.73 0.90 0.90 0.88 0.72  CALCIUM 9.5 9.7 9.1 9.2 9.3  MG -- -- -- -- --   No results found for this basename: INR:5,PROTIME:5 in the last 168 hours Cardiac markers: No results found for this basename: CK:3,CKMB:3,TROPONINI:3,MYOGLOBIN:3 in the last 168 hours No results found for this basename: POCBNP:3 in the last 168 hours Recent Results (from the past 240 hour(s))  MRSA PCR SCREENING     Status: Normal   Collection Time   12/25/10 11:03 AM      Component Value Range Status Comment   MRSA by PCR NEGATIVE  NEGATIVE  Final   URINE CULTURE     Status: Normal   Collection Time   12/27/10  7:34 PM      Component Value Range  Status Comment   Specimen Description URINE, RANDOM   Final    Special Requests NONE   Final    Setup Time [id number]   Final    Colony Count >=100,000 COLONIES/ML   Final    Culture     Final    Value: Multiple bacterial morphotypes present, none predominant. Suggest appropriate recollection if clinically indicated.   Report Status 12/29/2010 FINAL   Final   CULTURE, SPUTUM-ASSESSMENT     Status: Normal   Collection Time   12/31/10  1:31 PM      Component Value Range Status Comment   Specimen Description SPUTUM   Final    Special Requests NONE   Final    Sputum evaluation     Final    Value: MICROSCOPIC FINDINGS SUGGEST THAT THIS SPECIMEN IS NOT REPRESENTATIVE OF LOWER RESPIRATORY SECRETIONS. PLEASE RECOLLECT.     CALLED TO A AFATFAWO, RN 12/31/10 1413 BY K SCHULTZ   Report Status 12/31/2010 FINAL   Final     Radiology Reports Dg Chest Port 1 View  12/31/2010  *RADIOLOGY REPORT*  Clinical Data: Cough.  Fever.  Neutropenia.  PORTABLE CHEST - 1 VIEW  Comparison: 12/29/2010   Findings: Interval worsening of bilateral perihilar air space disease is seen compared to prior exam.  Peripheral atelectasis or scarring in the midlung zones is unchanged.  Cardiomegaly is stable.  No pleural effusions identified.  IMPRESSION: Interval worsening of bilateral perihilar air space disease.  Original Report Authenticated By: Danae Orleans, M.D.   Scheduled Meds:    . azithromycin  500 mg Oral Daily   Followed by  . azithromycin  250 mg Oral Daily  . carvedilol  3.125 mg Oral BID WC  . colchicine  0.6 mg Oral BID  . diphenhydrAMINE  12.5 mg Intravenous Q8H  . docusate sodium  100 mg Oral BID  . famotidine (PEPCID) IV  20 mg Intravenous Q12H  . methylPREDNISolone (SOLU-MEDROL) injection  60 mg Intravenous Q8H  . metoprolol tartrate  50 mg Oral BID  . potassium chloride SA  20 mEq Oral Daily  . prednisoLONE acetate  1 drop Both Eyes TID  . triamcinolone cream   Topical TID  . DISCONTD: furosemide  40 mg Oral Q24H   Continuous Infusions:    . sodium chloride 50 mL/hr at 12/31/10 1011  . sodium chloride     PRN Meds:.acetaminophen, acetaminophen, ondansetron (ZOFRAN) IV, sodium chloride, DISCONTD: hydrALAZINE  Assessment & Plan   Hematochezia  - now resolved, pt's Hg/Hct remain stable and at pt's baseline , monitor.     HYPERTENSION  - increased home B blcoker, added Norvasc 01-01-11  for better control  CONSTIPATION, CHRONIC , Gen Abd Pain - continue and ensure that pt has regular bowel regimen , still has Pain, will consult CT stable.will get GI Input noted, stable.  Thrombocytosis  - unclear etiology, remains at pt's baseline   Hyponatremia  - pt's baseline is 127-132 , Ur Na 40, decreased PO intake, gentle hydration.   Leukopenia / Neutropenia Noted input from Dr Gwenyth Bouillon Haem , Neutropenia  appears resolved with steroids, etiology remains unclear? Immune phenomenon.  Mild Cough - Azithro started 12-31-10 per Dr Synthia Innocent - cough better.  Generalized  Facial swelling with Whelps -  few open sores on oral mucosa.  Unclear etiology, ? Autoimmune phenomenon ? Early steven Johnsons, ? H/O Vasculitis (Bechet's) in the past - Derm Dr [place] called x3 on [phone] no reply left message, Dr  [place] called back will see her shortly. Have minimized Meds, DC'd ASA, continue IV Benadryl ,steroids and Zantac, added Po colchicine, monitor, Airway stable for now, but will move to step down with close monitoring, D/w PCCM x2 . Note called pt's Rhem MD Dr Nickola Major- not on call 12-31-10, called on call Rheum Dr Rennie Natter- she says she is not for call in the Hospital and is out of town 12-31-10, will try calling Rheum again Monday(dr Nickola Major off till Monday). Pt improving swelling better, no new breakouts 01-01-11   Asp precautions   DVT Prophylaxis    SCDs  See all Orders from today for further details

## 2011-01-01 NOTE — Progress Notes (Signed)
Speech Language Pathology Dysphagia Treatment Patient Details Name: [name] MRN: [id number] DOB: Aug 15, 1934 Today's Date: 01/01/2011  SLP Assessment/Plan/Recommendation Assessment Clinical Impression Statement: Pt voice quality clear today.  Pt reports decreased regurgitation.  Poor appetite and po intake, however, no overt s/s aspiration observed or reported.  No apparent plan to proceed with esophageal workup, recommended due to pt report during BSE. Plan Speech Therapy Frequency: min 2x/week Duration: 1 week Treatment/Interventions: Patient/family education;SLP instruction and feedback Potential to Achieve Goals: Good Recommendation Follow up Recommendations: None Equipment Recommended: None recommended by SLP Individuals Consulted Consulted and Agree with Results and Recommendations: Patient  SLP Goals     SLP Treatment General Patient observed directly with PO's: Yes Type of PO's observed: Thin liquids Feeding: Able to feed self Liquids provided via: Cup Behavior/Cognition: Alert;Cooperative Oral Cavity - Dentition: Edentulous Patient Positioning: Upright in bed  Oral Cavity - Oral Hygiene Does patient have any of the following "at risk" factors?: Lips - dry, cracked Brush patient's teeth BID with toothbrush (using toothpaste with fluoride): Yes Patient is HIGH RISK - Oral Care Protocol followed (see row info): Yes Patient is AT RISK - Oral Care Protocol followed (see row info): Yes   Assessment of Diet Tolerance Clinical Impression Statement: Pt voice quality clear today.  Pt reports decreased regurgitation.  Poor appetite and po intake, however, no overt s/s aspiration observed or reported.  No apparent plan to proceed with esophageal workup, recommended due to pt report during BSE. Supervision/Assistance required: Yes (intermittent) Type of cueing: Verbal Amount of cueing: Minimal Recommendations: Continue current diet (Consideration of esophageal workup to  evaluate motility)  Celia B. Murvin [facility], CCC-SLP [phone] 01/01/2011, 11:46 AM

## 2011-01-01 NOTE — Progress Notes (Signed)
Clinical social worker received referral from pt medical team that patient may need short term rehab at a skilled nursing facility due to patient deconditioning. CSW discussed concerns with patient who agreed that she may need extra assistance and is open to short term rehab at a skilled nursing facility or home health services depending on her needs when closer to being medically stable. CSW continuing to follow to assist with pt dc needs.    Catha Gosselin, Theresia Majors  [phone] .01/01/2011 14:36pm

## 2011-01-02 LAB — DIFFERENTIAL
Lymphocytes Relative: 13 % (ref 12–46)
Lymphs Abs: 1 10*3/uL (ref 0.7–4.0)
Monocytes Absolute: 0.6 10*3/uL (ref 0.1–1.0)
Monocytes Relative: 8 % (ref 3–12)
Neutro Abs: 6.1 10*3/uL (ref 1.7–7.7)

## 2011-01-02 LAB — CBC
HCT: 37 % (ref 36.0–46.0)
Hemoglobin: 12.4 g/dL (ref 12.0–15.0)
MCV: 89.6 fL (ref 78.0–100.0)
WBC: 7.7 10*3/uL (ref 4.0–10.5)

## 2011-01-02 LAB — BASIC METABOLIC PANEL
BUN: 24 mg/dL — ABNORMAL HIGH (ref 6–23)
CO2: 26 mEq/L (ref 19–32)
Chloride: 97 mEq/L (ref 96–112)
Creatinine, Ser: 0.78 mg/dL (ref 0.50–1.10)
Glucose, Bld: 126 mg/dL — ABNORMAL HIGH (ref 70–99)

## 2011-01-02 LAB — GLUCOSE, CAPILLARY: Glucose-Capillary: 116 mg/dL — ABNORMAL HIGH (ref 70–99)

## 2011-01-02 MED ORDER — SODIUM CHLORIDE 0.9 % IV SOLN: INTRAVENOUS | Status: AC

## 2011-01-02 MED ORDER — POLYETHYLENE GLYCOL 3350 17 G PO PACK
17.0000 g | PACK | Freq: Every day | ORAL | Status: DC
  Administered 2011-01-02 – 2011-01-12 (×10): 17 g via ORAL
  Filled 2011-01-02 (×11): qty 1

## 2011-01-02 MED ORDER — MAGNESIUM HYDROXIDE 400 MG/5ML PO SUSP
30.0000 mL | Freq: Every day | ORAL | Status: DC | PRN
  Administered 2011-01-03: 30 mL via ORAL
  Filled 2011-01-02: qty 30

## 2011-01-02 MED ORDER — HYDROCODONE-ACETAMINOPHEN 5-325 MG PO TABS
1.0000 | ORAL_TABLET | Freq: Four times a day (QID) | ORAL | Status: DC | PRN
  Administered 2011-01-02 – 2011-01-06 (×9): 1 via ORAL
  Filled 2011-01-02 (×10): qty 1

## 2011-01-02 MED ORDER — ENSURE PUDDING PO PUDG
1.0000 | Freq: Three times a day (TID) | ORAL | Status: DC
  Administered 2011-01-04 – 2011-01-12 (×20): 1 via ORAL
  Filled 2011-01-02: qty 1

## 2011-01-02 NOTE — Plan of Care (Signed)
Problem: Phase I Progression Outcomes Goal: Voiding-avoid urinary catheter unless indicated Outcome: Not Progressing Foley catheter inserted to closely monitor I&) and prevent skin breakdown because incontinent.  Problem: Phase II Progression Outcomes Goal: IV changed to normal saline lock Outcome: Not Progressing PICC  Problem: Phase III Progression Outcomes Goal: Voiding independently Foley inserted Goal: IV/normal saline lock discontinued PICC inserted to maintain IV access and draw labs

## 2011-01-02 NOTE — Progress Notes (Signed)
Subjective:  Feels better today  Objective: Weight change: 1 lb 1.6 oz (0.5 kg)  Intake/Output Summary (Last 24 hours) at 01/02/11 1215 Last data filed at 01/02/11 1017  Gross per 24 hour  Intake   2060 ml  Output   1055 ml  Net   1005 ml   Blood pressure 153/89, pulse 69, temperature 98.4 F (36.9 C), temperature source Oral, resp. rate 13, height 5\' 1"  (1.549 m), weight 201 lb 11.5 oz (91.5 kg), SpO2 96.00%. Temp:  [97.4 F (36.3 C)-98.5 F (36.9 C)] 98.4 F (36.9 C) (11/24 0809) Pulse Rate:  [69-95] 69  (11/24 1100) Resp:  [13-25] 13  (11/24 0800) BP: (130-153)/(70-89) 153/89 mmHg (11/24 0809) SpO2:  [93 %-99 %] 96 % (11/24 1100) Weight:  [201 lb 11.5 oz (91.5 kg)] 201 lb 11.5 oz (91.5 kg) (11/24 0500)  Physical Exam: General: alert oriented pleasant  HEENT: periorbital edema reduced, injected conjunctiva less intense, no icterus, hyperpigmenation around eyes and cheeks, lips swollen with ulcers, no desquamation in mouth itself  CCV: RRR no murmurs galllops or rubs  GI: soft, minimally tender in epigasrtrum pos bowel sounds  Pulm: clear to ausculationa bilaterally no wheezes rhonchi  Neuro: nonfocal  Skin: in additino to findings above has vesicles blisters on arms bilaterally with one area small bullous lesion on left arm  Lab Results:  Basename 01/02/11 0550 01/01/11 0600  WBC 7.7 5.0  HGB 12.4 12.7  HCT 37.0 38.1  PLT 561* 629*   BMET  Basename 01/02/11 0550 01/01/11 0600  NA 133* 131*  K 4.4 3.7  CL 97 93*  CO2 26 26  GLUCOSE 126* 139*  BUN 24* 25*  CREATININE 0.78 0.73  CALCIUM 9.2 9.5    Micro Results: Recent Results (from the past 240 hour(s))  MRSA PCR SCREENING     Status: Normal   Collection Time   12/25/10 11:03 AM      Component Value Range Status Comment   MRSA by PCR NEGATIVE  NEGATIVE  Final   URINE CULTURE     Status: Normal   Collection Time   12/27/10  7:34 PM      Component Value Range Status Comment   Specimen Description  URINE, RANDOM   Final    Special Requests NONE   Final    Setup Time [id number]   Final    Colony Count >=100,000 COLONIES/ML   Final    Culture     Final    Value: Multiple bacterial morphotypes present, none predominant. Suggest appropriate recollection if clinically indicated.   Report Status 12/29/2010 FINAL   Final   PATHOLOGIST SMEAR REVIEW     Status: Normal   Collection Time   12/30/10  9:04 AM      Component Value Range Status Comment   Tech Review MARKED ABSOLUTE NEUTROPENIA WITHOUT   Final   CULTURE, SPUTUM-ASSESSMENT     Status: Normal   Collection Time   12/31/10  1:31 PM      Component Value Range Status Comment   Specimen Description SPUTUM   Final    Special Requests NONE   Final    Sputum evaluation     Final    Value: MICROSCOPIC FINDINGS SUGGEST THAT THIS SPECIMEN IS NOT REPRESENTATIVE OF LOWER RESPIRATORY SECRETIONS. PLEASE RECOLLECT.     CALLED TO A AFATFAWO, RN 12/31/10 1413 BY K SCHULTZ   Report Status 12/31/2010 FINAL   Final     Studies/Results: Dg Chest 2 View  12/29/2010  *RADIOLOGY REPORT*  Clinical Data: Cough and congestion  CHEST - 2 VIEW  Comparison: 12/24/2010  Findings: Heart size appears normal.  No pleural effusion or pulmonary edema.  The lung volumes are decreased.  There is persistent bilateral perihilar opacities which may be related to infiltrates and/or atelectasis.  Review of the visualized osseous structures is unremarkable.  IMPRESSION:  1.  No significant change in bilateral perihilar opacities.  Original Report Authenticated By: Rosealee Albee, M.D.   Dg Chest 2 View  12/25/2010  *RADIOLOGY REPORT*  Clinical Data: 76 year old female with cough, shortness of breath, pain, bright red blood per rectum.  CHEST - 2 VIEW  Comparison: 09/28/2010 and earlier.  Findings: Stable cardiomegaly and mediastinal contours.  Hilar prominence is stable and probably relates to central vascular enlargement.  The lungs are clear. No acute osseous  abnormality identified.  IMPRESSION: Stable cardiomegaly.  No acute cardiopulmonary abnormality.  Original Report Authenticated By: Harley Hallmark, M.D.   Ct Abdomen Pelvis W Contrast  12/27/2010  *RADIOLOGY REPORT*  Clinical Data: Lower abdominal pain, constipation, history of diverticulosis, bladder prolapse, and prior hysterectomy  CT ABDOMEN AND PELVIS WITH CONTRAST  Technique:  Multidetector CT imaging of the abdomen and pelvis was performed following the standard protocol during bolus administration of intravenous contrast.  Contrast: OMNIPAQUE IOHEXOL 300 MG/ML IV SOLN  Comparison: 09/30/2010  Findings: Motion degraded images.  Mild patchy opacity / scarring at the lung bases.  Liver, spleen, pancreas, and adrenal jets are within normal limits.  Gallbladder is unremarkable.  No intrahepatic or extrahepatic ductal dilatation.  Bilateral renal cysts.  No hydronephrosis.  No evidence of bowel obstruction.  Colonic diverticulosis, without associated inflammatory changes.  Atherosclerotic calcifications of the abdominal aorta and branch vessels.  No abdominopelvic ascites.  No suspicious abdominopelvic lymphadenopathy.  Status post hysterectomy.  Ovaries are unremarkable.  Bladder is within normal limits.  Tiny fat containing inguinal hernias.  Degenerative changes of the visualized thoracolumbar spine.  IMPRESSION: Motion degraded images.  No evidence of bowel obstruction.  Colonic diverticulosis, without associated inflammatory changes.  No CT findings to account for the patient's abdominal pain.  Original Report Authenticated By: Charline Bills, M.D.   Dg Chest Port 1 View  12/31/2010  *RADIOLOGY REPORT*  Clinical Data: Cough.  Fever.  Neutropenia.  PORTABLE CHEST - 1 VIEW  Comparison: 12/29/2010  Findings: Interval worsening of bilateral perihilar air space disease is seen compared to prior exam.  Peripheral atelectasis or scarring in the midlung zones is unchanged.  Cardiomegaly is stable.  No  pleural effusions identified.  IMPRESSION: Interval worsening of bilateral perihilar air space disease.  Original Report Authenticated By: Danae Orleans, M.D.   Dg Abd 2 Views  12/25/2010  *RADIOLOGY REPORT*  Clinical Data: 76 year old female with abdominal pain.  Bright red blood from rectum.  ABDOMEN - 2 VIEW  Comparison: CT abdomen pelvis 09/30/2010 and earlier.  Findings: Supine and left-side down lateral decubitus views.  No pneumoperitoneum. Nonobstructed bowel gas pattern.  Lung bases appear normal. No acute osseous abnormality identified.  Chronic pelvic phleboliths.  IMPRESSION: Nonobstructed bowel gas pattern, no free air.  Original Report Authenticated By: Harley Hallmark, M.D.    Antibiotics:  Anti-infectives     Start     Dose/Rate Route Frequency Ordered Stop   01/02/11 1000   azithromycin (ZITHROMAX) tablet 500 mg        500 mg Oral Daily 01/01/11 1057 01/05/11 0959   01/01/11 1000  azithromycin [facility]) tablet 250 mg  Status:  Discontinued        250 mg Oral Daily 12/31/10 1051 01/01/11 1057   12/31/10 1200   azithromycin (ZITHROMAX) tablet 500 mg        500 mg Oral Daily 12/31/10 1051 12/31/10 1256          Medications: Scheduled Meds:    . amLODipine  10 mg Oral Daily  . azithromycin  500 mg Oral Daily  . carvedilol  6.25 mg Oral BID WC  . colchicine  0.6 mg Oral BID  . docusate sodium  100 mg Oral BID  . famotidine (PEPCID) IV  20 mg Intravenous Q12H  . feeding supplement  1 Container Oral TID BM  . methylPREDNISolone (SOLU-MEDROL) injection  60 mg Intravenous Q8H  . polyethylene glycol  17 g Oral Daily  . potassium chloride SA  20 mEq Oral Daily  . prednisoLONE acetate  1 drop Both Eyes TID  . sodium chloride  10 mL Intracatheter Q12H  . triamcinolone cream   Topical TID  . white petrolatum       Continuous Infusions:    . sodium chloride 75 mL/hr at 01/01/11 0848  . sodium chloride     PRN Meds:.acetaminophen, acetaminophen, magnesium  hydroxide, ondansetron (ZOFRAN) IV, sodium chloride, sodium chloride  Assessment/Plan: [name] is a 76 y.o. female  with vesicular bullous rash, periorbital edema, lip swelling and neutropenia without clear cause. Onset appears to be in the hospital though I wonder if this was brewing prior to admission given her blood tinged sputum and her hematochezia. Differential here should include Kathlen Brunswick, Anaphylactic reaction to drugs, latex, food, autoimmune disease. Certainly her prior admission with vaginal ulcers, leukocytosis and episcleritis could suggest Behcets (although uveitis is more commmon optho finding with this disease)  It is possible that infection such as with Mycoplasma or viral infection could have triggered this though I doubt it. HIV negative. Repeat CXR better.  ? PNa:  --will finish 5 days of azithromycin  Kathlen Brunswick vs Behcets/autoimmune./allergic rxn  cold agglutinins pending  --I agree with consult with Dermatology, ONcology and I would also bring Rheumatology back for help as well. Jule Ser saw pt during last stay --I DO NOT THINK THAT THIS is a primary ID problem --Patient needs fu with Rheum --agree with colchicine  I will sign off for now please call with further questions  LOS: 9 days   Acey Lav 01/02/2011, 12:15 PM

## 2011-01-02 NOTE — Progress Notes (Addendum)
[name] ZOX:[id number] is a 76 y.o. female,  Outpatient Primary MD for the patient is No primary provider on file. Chief Complaint  Patient presents with  . Rectal Bleeding      01/02/2011   Subjective:   [name] today has, No headache, No chest pain, Mild generalised abdominal pain - No Nausea, No new weakness tingling or numbness, Mild Cough - No SOB.    Objective:   Filed Vitals:   01/02/11 0407 01/02/11 0500 01/02/11 0600 01/02/11 0809  BP:   148/85 153/89  Pulse: 86  82   Temp: 98.5 F (36.9 C)   98.4 F (36.9 C)  TempSrc: Oral   Oral  Resp: 16  15   Height:      Weight:  91.5 kg (201 lb 11.5 oz)    SpO2: 99%  93%    Wt Readings from Last 3 Encounters:  01/02/11 91.5 kg (201 lb 11.5 oz)  09/03/10 95.482 kg (210 lb 8 oz)  06/04/10 95.437 kg (210 lb 6.4 oz)    Intake/Output Summary (Last 24 hours) at 01/02/11 0938 Last data filed at 01/02/11 0813  Gross per 24 hour  Intake   1645 ml  Output   1055 ml  Net    590 ml   Exam  Awake Alert, Oriented *3, No new F.N deficits, Normal affect, has reduced facial swelling and whelps all over, no tongue swelling, few oral ulcers. .AT, Supple Neck,No JVD, No cervical lymphadenopathy appriciated.  Symmetrical Chest wall movement, Good air movement bilaterally, CTAB RRR,No Gallops,Rubs or new Murmurs, No Parasternal Heave +ve B.Sounds, Abd Soft, Non tender, No organomegaly appriciated, No rebound -guarding or rigidity. No Cyanosis, Clubbing.    Data Review   CBC  Lab 01/02/11 0550 01/01/11 0600 12/31/10 0630 12/30/10 0904 12/30/10 0610  WBC 7.7 5.0 2.4* 1.4* 1.2*  HGB 12.4 12.7 12.0 12.1 11.9*  HCT 37.0 38.1 35.7* 35.0* 35.6*  PLT 561* 629* 572* 562* 535*  MCV 89.6 89.0 89.5 88.4 88.8  MCH 30.0 29.7 30.1 30.6 29.7  MCHC 33.5 33.3 33.6 34.6 33.4  RDW 13.7 13.5 13.6 13.4 13.5  LYMPHSABS 1.0 0.9 0.5* 0.6* --  MONOABS 0.6 0.4 0.1 0.1 --  EOSABS 0.0 0.0 0.0 0.0 --  BASOSABS 0.0 0.0 0.0 0.0 --    BANDABS -- -- -- -- --    Lab 01/02/11 0550 01/01/11 0600 12/31/10 0630 12/30/10 0610 12/29/10 0604  NA 133* 131* 130* 131* 127*  K 4.4 3.7 4.8 4.2 3.7  CL 97 93* 95* 95* 91*  CO2 26 26 24 24 22   GLUCOSE 126* 139* 141* 105* 126*  BUN 24* 25* 25* 18 15  CREATININE 0.78 0.73 0.90 0.90 0.88  CALCIUM 9.2 9.5 9.7 9.1 9.2  MG -- -- -- -- --   No results found for this basename: INR:5,PROTIME:5 in the last 168 hours Cardiac markers: No results found for this basename: CK:3,CKMB:3,TROPONINI:3,MYOGLOBIN:3 in the last 168 hours No results found for this basename: POCBNP:3 in the last 168 hours Recent Results (from the past 240 hour(s))  MRSA PCR SCREENING     Status: Normal   Collection Time   12/25/10 11:03 AM      Component Value Range Status Comment   MRSA by PCR NEGATIVE  NEGATIVE  Final   URINE CULTURE     Status: Normal   Collection Time   12/27/10  7:34 PM      Component Value Range Status Comment   Specimen Description  URINE, RANDOM   Final    Special Requests NONE   Final    Setup Time [id number]   Final    Colony Count >=100,000 COLONIES/ML   Final    Culture     Final    Value: Multiple bacterial morphotypes present, none predominant. Suggest appropriate recollection if clinically indicated.   Report Status 12/29/2010 FINAL   Final   PATHOLOGIST SMEAR REVIEW     Status: Normal   Collection Time   12/30/10  9:04 AM      Component Value Range Status Comment   Tech Review MARKED ABSOLUTE NEUTROPENIA WITHOUT   Final   CULTURE, SPUTUM-ASSESSMENT     Status: Normal   Collection Time   12/31/10  1:31 PM      Component Value Range Status Comment   Specimen Description SPUTUM   Final    Special Requests NONE   Final    Sputum evaluation     Final    Value: MICROSCOPIC FINDINGS SUGGEST THAT THIS SPECIMEN IS NOT REPRESENTATIVE OF LOWER RESPIRATORY SECRETIONS. PLEASE RECOLLECT.     CALLED TO A AFATFAWO, RN 12/31/10 1413 BY K SCHULTZ   Report Status 12/31/2010 FINAL   Final      Radiology Reports Chest Portable 1 View Post Insertion To Confirm Placement As Interpreted By Radiologist  01/01/2011  *RADIOLOGY REPORT*  Clinical Data: PICC line placement.  PORTABLE CHEST - 1 VIEW  Comparison: 12/31/2010  Findings: Right-sided PICC line noted the tip projecting over the SVC.  No pneumothorax.  Mildly improved perihilar opacities noted.  Mild cardiomegaly is present with tortuosity of the thoracic aorta.  There is linear subsegmental atelectasis at both lung bases.  IMPRESSION:  1.  Right PICC line tip:  SVC.  No pneumothorax. 2.  Reduced perihilar airspace opacity. 3.  Mild bibasilar subsegmental atelectasis. 4.  Mild cardiomegaly.  Original Report Authenticated By: Dellia Cloud, M.D.   Scheduled Meds:    . amLODipine  10 mg Oral Daily  . azithromycin  500 mg Oral Daily  . carvedilol  6.25 mg Oral BID WC  . colchicine  0.6 mg Oral BID  . docusate sodium  100 mg Oral BID  . famotidine (PEPCID) IV  20 mg Intravenous Q12H  . feeding supplement  1 Container Oral TID BM  . methylPREDNISolone (SOLU-MEDROL) injection  60 mg Intravenous Q8H  . polyethylene glycol  17 g Oral Daily  . potassium chloride SA  20 mEq Oral Daily  . prednisoLONE acetate  1 drop Both Eyes TID  . sodium chloride  10 mL Intracatheter Q12H  . triamcinolone cream   Topical TID  . white petrolatum      . DISCONTD: azithromycin  250 mg Oral Daily   Continuous Infusions:    . sodium chloride 75 mL/hr at 01/01/11 0848  . sodium chloride     PRN Meds:.acetaminophen, acetaminophen, magnesium hydroxide, ondansetron (ZOFRAN) IV, sodium chloride, sodium chloride  Assessment & Plan   Generalized Facial swelling with Whelps -  few open sores on oral mucosa.  Unclear etiology, ? Autoimmune phenomenon ? Early steven Johnsons, ? H/O Vasculitis (Bechet's) in the past - Derm Dr [place] seen pt 12-31-10 D/W her agrees with present Rx. Have minimized Meds, DC'd ASA, continue IV Benadryl ,steroids and  Zantac, added Po colchicine,  Airway remains stable , monitor on step down with close monitoring, D/w PCCM x2  12-31-10. Note called pt's Rhem MD Dr Nickola Major- not on call 12-31-10, called on call  Rheum Dr Rennie Natter- she says she is not for call in the Hospital and is out of town 12-31-10, will try calling Rheum again Monday(dr Nickola Major off till Monday). Pt improving swelling better, no new breakouts 01-02-11. Improving.   Leukopenia / Neutropenia Noted input from Dr Gwenyth Bouillon Haem , Neutropenia  appears resolved with steroids, etiology remains unclear? Immune phenomenon.Smear inconclusive.  Hematochezia  - now resolved, pt's Hg/Hct remain stable and at pt's baseline , monitor.     HYPERTENSION  - increased home B blcoker, added Norvasc 01-01-11  for better control, stable  CONSTIPATION, CHRONIC , Gen Abd Pain - continue and ensure that pt has regular bowel regimen ,  GI Input noted, Tap water Enema x 1 on 01-02-11  Thrombocytosis  - unclear etiology, remains at pt's baseline , likely related to AI phenomenon  Hyponatremia  - pt's baseline is 127-132 , Ur Na 40, decreased PO intake, gentle hydration. Improving 01-02-11  Mild Cough - Azithro started 12-31-10 per Dr Synthia Innocent - cough better. CXR better.  Asp precautions   DVT Prophylaxis    SCDs  See all Orders from today for further details

## 2011-01-03 ENCOUNTER — Inpatient Hospital Stay (HOSPITAL_COMMUNITY): Payer: Medicare Other

## 2011-01-03 LAB — DIFFERENTIAL
Eosinophils Absolute: 0 10*3/uL (ref 0.0–0.7)
Eosinophils Relative: 0 % (ref 0–5)
Metamyelocytes Relative: 0 %
Monocytes Absolute: 1.1 10*3/uL — ABNORMAL HIGH (ref 0.1–1.0)
Monocytes Relative: 8 % (ref 3–12)
nRBC: 0 /100 WBC

## 2011-01-03 LAB — BASIC METABOLIC PANEL
BUN: 22 mg/dL (ref 6–23)
Calcium: 8.7 mg/dL (ref 8.4–10.5)
GFR calc non Af Amer: 83 mL/min — ABNORMAL LOW (ref >90)
Glucose, Bld: 135 mg/dL — ABNORMAL HIGH (ref 70–99)
Sodium: 129 mEq/L — ABNORMAL LOW (ref 135–145)

## 2011-01-03 LAB — CBC
HCT: 36.5 % (ref 36.0–46.0)
Hemoglobin: 12.2 g/dL (ref 12.0–15.0)
MCV: 89.2 fL (ref 78.0–100.0)
Platelets: 504 10*3/uL — ABNORMAL HIGH (ref 150–400)
RBC: 4.09 MIL/uL (ref 3.87–5.11)
WBC: 13.4 10*3/uL — ABNORMAL HIGH (ref 4.0–10.5)

## 2011-01-03 LAB — LEGIONELLA ANTIGEN, URINE

## 2011-01-03 LAB — GLUCOSE, CAPILLARY

## 2011-01-03 MED ORDER — METHYLPREDNISOLONE SODIUM SUCC 40 MG IJ SOLR
40.0000 mg | INTRAMUSCULAR | Status: DC
  Administered 2011-01-04 – 2011-01-09 (×6): 40 mg via INTRAVENOUS
  Filled 2011-01-03 (×8): qty 1

## 2011-01-03 NOTE — Progress Notes (Signed)
[name] [id number] is a 76 y.o. female,  Outpatient Primary MD for the patient is No primary provider on file. Chief Complaint  Patient presents with  . Rectal Bleeding      01/03/2011   Subjective:   [name] today has, No headache, No chest pain, Mild generalised abdominal pain - No Nausea, No new weakness tingling or numbness, Mild Cough - No SOB.    Objective:   Filed Vitals:   01/03/11 0430 01/03/11 0432 01/03/11 0500 01/03/11 0726  BP:  158/89  153/73  Pulse:      Temp: 98.4 F (36.9 C) 98.4 F (36.9 C)  98.1 F (36.7 C)  TempSrc: Oral Oral  Oral  Resp:  17    Height:      Weight:   95 kg (209 lb 7 oz)   SpO2:  98%     Wt Readings from Last 3 Encounters:  01/03/11 95 kg (209 lb 7 oz)  09/03/10 95.482 kg (210 lb 8 oz)  06/04/10 95.437 kg (210 lb 6.4 oz)    Intake/Output Summary (Last 24 hours) at 01/03/11 0957 Last data filed at 01/03/11 0726  Gross per 24 hour  Intake    890 ml  Output   1050 ml  Net   -160 ml   Exam  Awake Alert, Oriented *3, No new F.N deficits, Normal affect, has reduced facial swelling and  No new whelps, no tongue swelling, few old healing oral ulcers. [place].AT, Supple Neck,No JVD, No cervical lymphadenopathy appriciated.  Symmetrical Chest wall movement, Good air movement bilaterally, few rales RRR,No Gallops,Rubs or new Murmurs, No Parasternal Heave +ve B.Sounds, Abd Soft, Non tender, No organomegaly appriciated, No rebound -guarding or rigidity. No Cyanosis, Clubbing.    Data Review   CBC  Lab 01/03/11 0500 01/02/11 0550 01/01/11 0600 12/31/10 0630 12/30/10 0904  WBC 13.4* 7.7 5.0 2.4* 1.4*  HGB 12.2 12.4 12.7 12.0 12.1  HCT 36.5 37.0 38.1 35.7* 35.0*  PLT 504* 561* 629* 572* 562*  MCV 89.2 89.6 89.0 89.5 88.4  MCH 29.8 30.0 29.7 30.1 30.6  MCHC 33.4 33.5 33.3 33.6 34.6  RDW 13.6 13.7 13.5 13.6 13.4  LYMPHSABS 1.7 1.0 0.9 0.5* 0.6*  MONOABS 1.1* 0.6 0.4 0.1 0.1  EOSABS 0.0 0.0 0.0 0.0 0.0  BASOSABS  0.0 0.0 0.0 0.0 0.0  BANDABS -- -- -- -- --    Lab 01/03/11 0500 01/02/11 0550 01/01/11 0600 12/31/10 0630 12/30/10 0610  NA 129* 133* 131* 130* 131*  K 4.5 4.4 3.7 4.8 4.2  CL 97 97 93* 95* 95*  CO2 26 26 26 24 24   GLUCOSE 135* 126* 139* 141* 105*  BUN 22 24* 25* 25* 18  CREATININE 0.72 0.78 0.73 0.90 0.90  CALCIUM 8.7 9.2 9.5 9.7 9.1  MG -- -- -- -- --     Recent Results (from the past 240 hour(s))  MRSA PCR SCREENING     Status: Normal   Collection Time   12/25/10 11:03 AM      Component Value Range Status Comment   MRSA by PCR NEGATIVE  NEGATIVE  Final   URINE CULTURE     Status: Normal   Collection Time   12/27/10  7:34 PM      Component Value Range Status Comment   Specimen Description URINE, RANDOM   Final    Special Requests NONE   Final    Setup Time [id number]   Final    Colony Count >=100,000 COLONIES/ML  Final    Culture     Final    Value: Multiple bacterial morphotypes present, none predominant. Suggest appropriate recollection if clinically indicated.   Report Status 12/29/2010 FINAL   Final   PATHOLOGIST SMEAR REVIEW     Status: Normal   Collection Time   12/30/10  9:04 AM      Component Value Range Status Comment   Tech Review MARKED ABSOLUTE NEUTROPENIA WITHOUT   Final   CULTURE, SPUTUM-ASSESSMENT     Status: Normal   Collection Time   12/31/10  1:31 PM      Component Value Range Status Comment   Specimen Description SPUTUM   Final    Special Requests NONE   Final    Sputum evaluation     Final    Value: MICROSCOPIC FINDINGS SUGGEST THAT THIS SPECIMEN IS NOT REPRESENTATIVE OF LOWER RESPIRATORY SECRETIONS. PLEASE RECOLLECT.     CALLED TO A AFATFAWO, RN 12/31/10 1413 BY K SCHULTZ   Report Status 12/31/2010 FINAL   Final     Radiology Reports Chest Portable 1 View Post Insertion To Confirm Placement As Interpreted By Radiologist  01/01/2011  *RADIOLOGY REPORT*  Clinical Data: PICC line placement.  PORTABLE CHEST - 1 VIEW  Comparison: 12/31/2010   Findings: Right-sided PICC line noted the tip projecting over the SVC.  No pneumothorax.  Mildly improved perihilar opacities noted.  Mild cardiomegaly is present with tortuosity of the thoracic aorta.  There is linear subsegmental atelectasis at both lung bases.  IMPRESSION:  1.  Right PICC line tip:  SVC.  No pneumothorax. 2.  Reduced perihilar airspace opacity. 3.  Mild bibasilar subsegmental atelectasis. 4.  Mild cardiomegaly.  Original Report Authenticated By: Dellia Cloud, M.D.   Scheduled Meds:    . amLODipine  10 mg Oral Daily  . azithromycin  500 mg Oral Daily  . carvedilol  6.25 mg Oral BID WC  . colchicine  0.6 mg Oral BID  . docusate sodium  100 mg Oral BID  . famotidine (PEPCID) IV  20 mg Intravenous Q12H  . feeding supplement  1 Container Oral TID BM  . methylPREDNISolone (SOLU-MEDROL) injection  60 mg Intravenous Q8H  . polyethylene glycol  17 g Oral Daily  . potassium chloride SA  20 mEq Oral Daily  . sodium chloride  10 mL Intracatheter Q12H  . triamcinolone cream   Topical TID  . DISCONTD: prednisoLONE acetate  1 drop Both Eyes TID   Continuous Infusions:    . sodium chloride     PRN Meds:.acetaminophen, acetaminophen, HYDROcodone-acetaminophen, magnesium hydroxide, ondansetron (ZOFRAN) IV, sodium chloride, sodium chloride  Assessment & Plan   Generalized Facial swelling with Whelps -  few old sores on oral mucosa.  Unclear etiology, ? Autoimmune phenomenon ? Early steven Johnsons, ? H/O Vasculitis (Bechet's) in the past - Derm Dr [place] seen pt 12-31-10 D/W her agrees with present Rx. Have minimized Meds, DC'd ASA, continue PRN IV Benadryl , reduce IV steroids and DC Zantac, continue Po colchicine,  Airway remains stable , monitor out of step down with close monitoring, D/w PCCM x2  12-31-10. Note called pt's Rhem MD Dr Nickola Major- not on call 12-31-10, called on call Rheum Dr Rennie Natter- she says she is not for call in the Hospital and is out of town 12-31-10,  will try calling Rheum again Monday(dr Nickola Major off till Monday). Pt improving swelling better, no new breakouts 01-03-11.    Leukopenia / Neutropenia Noted input from Dr Gwenyth Bouillon Haem ,  Neutropenia  resolved with steroids, etiology remains unclear? Immune phenomenon. Smear inconclusive. WBC now little higher likely steroid induced, few rales so get CXR again.  Hematochezia  - now resolved, pt's Hg/Hct remain stable and at pt's baseline , monitor.     HYPERTENSION  - increased home B blcoker, added Norvasc 01-01-11  for better control, stable  CONSTIPATION, CHRONIC , Gen Abd Pain - continue and ensure that pt has regular bowel regimen ,  GI Input noted, Tap water Enema x 1 on 01-03-11  Thrombocytosis  - unclear etiology, remains at pt's baseline , likely related to AI phenomenon  Hyponatremia  - pt's baseline is 127-132 , Ur Na 40, Po intake better, DC IVF, monitor Na closely.   Mild Cough - Azithro started 12-31-10 per Dr Synthia Innocent - cough better. CXR better.  Asp precautions   DVT Prophylaxis    SCDs  See all Orders from today for further details

## 2011-01-04 ENCOUNTER — Inpatient Hospital Stay (HOSPITAL_COMMUNITY): Payer: Medicare Other

## 2011-01-04 LAB — BASIC METABOLIC PANEL
BUN: 19 mg/dL (ref 6–23)
Calcium: 8.4 mg/dL (ref 8.4–10.5)
GFR calc non Af Amer: 84 mL/min — ABNORMAL LOW (ref >90)
Glucose, Bld: 96 mg/dL (ref 70–99)

## 2011-01-04 LAB — DIFFERENTIAL
Basophils Relative: 0 % (ref 0–1)
Eosinophils Absolute: 0 10*3/uL (ref 0.0–0.7)
Monocytes Absolute: 1.4 10*3/uL — ABNORMAL HIGH (ref 0.1–1.0)
Neutro Abs: 11.1 10*3/uL — ABNORMAL HIGH (ref 1.7–7.7)

## 2011-01-04 LAB — CBC
HCT: 35.1 % — ABNORMAL LOW (ref 36.0–46.0)
Hemoglobin: 11.8 g/dL — ABNORMAL LOW (ref 12.0–15.0)
MCH: 29.8 pg (ref 26.0–34.0)
MCHC: 33.6 g/dL (ref 30.0–36.0)
MCV: 88.6 fL (ref 78.0–100.0)

## 2011-01-04 LAB — VARICELLA ZOSTER ANTIBODY, IGG: Varicella IgG: 4.25 {ISR} — ABNORMAL HIGH

## 2011-01-04 MED ORDER — IOHEXOL 300 MG/ML  SOLN: 100.0000 mL | Freq: Once | INTRAMUSCULAR | Status: AC | PRN

## 2011-01-04 MED ORDER — TRAMADOL HCL 50 MG PO TABS
50.0000 mg | ORAL_TABLET | ORAL | Status: AC | PRN
  Administered 2011-01-04 – 2011-01-05 (×2): 50 mg via ORAL
  Filled 2011-01-04 (×3): qty 1

## 2011-01-04 NOTE — Plan of Care (Signed)
Problem: Phase III Progression Outcomes Goal: Activity at appropriate level-compared to baseline (UP IN CHAIR FOR HEMODIALYSIS)  Outcome: Progressing Pt needs lots of encouragement; she is self-limiting

## 2011-01-04 NOTE — Progress Notes (Signed)
Speech Pathology: Dysphagia Treatment Note  Patient was observed with : Mechanical Soft / Ground and Thin liquids.  Lung Sounds:  Clear  Temperature: afebrile  Clinical Impression: Demonstrates a functional oral-pharyngeal swallow with observed consistencies without any need for intervention or assistance.  Provided descriptions/examples of food consistencies of Dys.3 as a possible upgrade if patient desired, however patient reported the current diet texture level (Dys.2) is most similar to what she prepared/ate at home prior to admission, "my daughter would make it just like this."  No further skilled goals at this time.  Recommendations:  1.  Continue Dys.2 diet and Thin liquids; likely not to upgrade per patient request. 2.  D/C skilled SLP services at this time as all goals met.  Pain:   10 /10 Intervention Required:   Yes Left side abdomen; RN aware and preparing pain medication at time of report.  Goals: All Goals Met  Myra Rude, M.S.,CCC-SLP Pager [phone]

## 2011-01-04 NOTE — Consult Note (Signed)
Rheumatology Consult Note Date: 01/04/2011  Patient name: [name] Medical record number: [id number] Date of birth: 1934/04/06 Age: 76 y.o. Gender: female  History of Present Illness: The patient is a 76 yo woman, history of CHF, HTN, chronic pain, recently admitted 12/25/10 for hematochezia, consulted for facial swelling and oral and epidermal lesions.  The patient was noted on 12/30/10 to have generalized facial swelling, as well as oral mucosal lesions and bilateral arm lesions.  She was initially treated with Solu-medrol 60 mg IV TID (first dose 11/21), transitioned to 40 mg IV daily today, with significant improvement in symptoms.  On examination today, no facial swelling or oral ulcers are noted, though the patient continues to complain of diffuse pain in her mouth.  The patient continues to have bilateral upper extremity bullous lesions, but reports no other lesions on the rest of her body.  The patient notes diffuse whole-body pain, most significant in her abdomen, but no fevers, chills, chest pain, or SOB.    The patient has previously been seen in our office as an outpatient, and at that time a laboratory autoimmune work-up was largely negative.  During this admission, the patient was found to have an ESR = 94, CRP = 15.38, and a weakly positive RF = 27 (though of note, during a previous admission 2 months ago, ESR = 8, CRP = 1.3).  Also during this admission, CMV IgM was found to be positive at 1.33.  ANCA panels had previously been negative in the outpatient setting, but a discharge summary from 2 months ago notes a lab value of P-ANCA = 1.64 at that time, though this value cannot presently be located in our system.  Outpatient Medications: Medications Prior to Admission  Medication Sig Dispense Refill  . aspirin 81 MG tablet Take 81 mg by mouth daily.        . bisacodyl (DULCOLAX) 5 MG EC tablet Take 5 mg by mouth daily. For      . carvedilol (COREG) 3.125 MG tablet        . ferrous  fumarate (FERRETTS) 325 (106 FE) MG TABS Take by mouth 2 (two) times daily.        . furosemide (LASIX) 40 MG tablet Take 40 mg by mouth daily.        Marland Kitchen KLOR-CON M20 20 MEQ tablet take 2 tablets by mouth twice a day  120 tablet  6  . lisinopril (PRINIVIL,ZESTRIL) 10 MG tablet take 1 tablet by mouth once daily  30 tablet  7  . traMADol (ULTRAM) 50 MG tablet Take 50 mg by mouth every 6 (six) hours as needed. For pain       Allergies: Latex; Penicillins; and Tenivac Past Medical History  Diagnosis Date  . HTN (hypertension)   . Diverticulosis   . Hemorrhoid   . Osteoarthritis   . Constipation, chronic   . UTI (lower urinary tract infection)   . H/O: hysterectomy   . Systolic and diastolic CHF, chronic     45%  . Female bladder prolapse     with prolapse uterus, cystocele, s/p TVH/BSO  . Obesity (BMI 30-39.9)     wt. 94.1 kg 12/2010   Past Surgical History  Procedure Date  . Wrist surgery   . Vaginal hysterectomy,cystocele and prolapse  repair NOV. 2008   Family History  Problem Relation Age of Onset  . Diabetes Brother   . Hypertension Mother   . Leukemia Brother   . Cancer Brother   .  Stroke Mother   . Diabetes Father    History   Social History  . Marital Status: Single    Spouse Name: N/A    Number of Children: N/A  . Years of Education: N/A   Occupational History  . retired Futures trader    Social History Main Topics  . Smoking status: Never Smoker   . Smokeless tobacco: Not on file  . Alcohol Use: No  . Drug Use: Not on file  . Sexually Active: Not on file   Other Topics Concern  . Not on file   Social History Narrative  . No narrative on file    Review of Systems: Review of Systems negative except as noted in HPI.  Physical Exam: Blood pressure 116/52, pulse 68, temperature 98.4 F (36.9 C), temperature source Oral, resp. rate 20, height 5\' 1"  (1.549 m), weight 208 lb 5.4 oz (94.5 kg), SpO2 95.00%. General: alert, cooperative, and in no  apparent distress Skin: Multiple scattered 1cm bullous lesions noted on bilateral proximal upper extremities HEENT: pupils equal round and reactive to light, vision grossly intact, no facial edema noted, oropharynx non-erythematous with no oropharyngeal ulcers noted Neck: supple, no lymphadenopathy, no JVD Lungs: clear to ascultation bilaterally, normal work of respiration, no wheezes, rales, ronchi Heart: regular rate and rhythm, no murmurs, gallops, or rubs Abdomen: obese, moderately diffusely tender to palpation, non-distended, normal bowel sounds Extremities: no cyanosis, clubbing, or edema Neurologic: alert & oriented X3, cranial nerves II-XII intact, strength grossly intact, sensation intact to light touch  Lab results: Basic Metabolic Panel:  Basename 01/04/11 0546 01/03/11 0500  NA 129* 129*  K 4.1 4.5  CL 96 97  CO2 28 26  GLUCOSE 96 135*  BUN 19 22  CREATININE 0.67 0.72  CALCIUM 8.4 8.7  MG -- --  PHOS -- --   CBC:  Basename 01/04/11 0546 01/03/11 0500  WBC 15.3* 13.4*  NEUTROABS 11.1* 10.6*  HGB 11.8* 12.2  HCT 35.1* 36.5  MCV 88.6 89.2  PLT 467* 504*   Misc. Labs: CRP: 15.38 ESR: 94 ANA: Negative RF: 27  CMV IgM: 1.33 HIV: Negative Urine Pregnancy: positive  Imaging results:  Dg Chest Port 1 View  01/03/2011  *RADIOLOGY REPORT*  Clinical Data: Cough  PORTABLE CHEST - 1 VIEW  Comparison: 01/01/2011  Findings: Right arm PICC extends to the proximal SVC. Improved aeration, with residual linear scarring or atelectasis in the mid and lower left lung.  Right lung clear.  No effusion.  Stable mild cardiomegaly.  IMPRESSION: Improved aeration with minimal residual left perihilar scarring or atelectasis.  Original Report Authenticated By: Osa Craver, M.D.   Assessment & Plan by Problem: The patient is a 76 yo woman, consulted for a 5-day history of facial edema (resolved), oral ulcers (resolved), and upper extremity bullous lesions.  1. Bullous Upper  Extremity Lesions - The patient has multiple bilateral proximal UE bullous lesions, concerning for bullous pemphigoid vs drug-induced bullous pemphigoid vs pemphigus vulgaris vs other autoimmune bullous skin disease.  The patient was found to be CMV IgM positive, but the rash associated with CMV is typically macular/papular, rather than bullous.  Bullous SLE may produce similar lesions, but is very unlikely in the setting of negative ANA, and the absence of other SLE symptoms.  Bechet's syndrome can indeed produce oral and genital ulcers, but would not produce the UE bullae seen in this patient, and is therefore unlikely.  Similarly, Sjogren's syndrome may also produce oral ulcers, and  would have improved with steroids, but would not produce the present UE bullae.   ANCA panels have previously been negative in the outpatient setting, but in light of a reportedly positive P-ANCA 2 months ago, we will repeat an ANCA panel. -we agree with the decision to undergo skin biopsy of the UE bullae by Dermatology -continue steroid treatment, as this appears to be improving the patient's symptoms -recheck ANCA panel  2. Oral ulcers - No oral ulcers presently seen on examination.  Differential includes a bullous skin process vs a sjogren-type syndrome that has improved with steroids, less likely Bechet's syndrome.  See Problem #1 for full details. -continue steroid treatment -await UE skin biopsy results  3.  Perihilar Opacities - noted on chest x-rays.  Amyloidosis is unlikely, but could explain hilar adenopathy and facial swelling, though it would be unusual for the facial swelling to resolve so quickly in this condition. -we agree with CT chest, will f/u results  Signed: Janalyn Harder, MD, PGY1 01/04/2011, 4:10 PM   ADDENDUM 01/05/11 8:26AM Patient was seen and examined with the resident. The above note was reviewed and I agree with the physical exam. Exam is more typical of a pemphigoid reaction vs a steven's  johnson type picture.?CMV as recent titres were positive. Full work up about a month ago for autoimmune disease which could have been associated with her scleritis was negative, previous workup in the hospital was also negative. There is this question of a positive pANCA, which I have not been able to find, repeat in the offive was negative, we will repeat this, although rash again less typical for a vasculitis. Recent CT was negative for sarcoid, although rash not typical for this. I agree with skin biopsy as previous serologies have been negative for a diagnosis.  Patient is on steroids which should help a variety of conditions. Thank you for the consult, we will follow.

## 2011-01-04 NOTE — Consult Note (Signed)
Reason for Consult:skin biopsy Referring Physician:   [name] is an 76 y.o. female.  HPI:  Pt was evaluated on Thurs, 12/31/10 and handwritten consult note put in chart (unable to log into Epic). Was called today to perform skin biopsy on UE bullous lesions. PT with hx of angioedema, facial and lip. Hx of erythematous plaques on bilat UE with scattered bulla.  Past Medical History  Diagnosis Date  . HTN (hypertension)   . Diverticulosis   . Hemorrhoid   . Osteoarthritis   . Constipation, chronic   . UTI (lower urinary tract infection)   . H/O: hysterectomy   . Systolic and diastolic CHF, chronic     45%  . Female bladder prolapse     with prolapse uterus, cystocele, s/p TVH/BSO  . Obesity (BMI 30-39.9)     wt. 94.1 kg 12/2010    Past Surgical History  Procedure Date  . Wrist surgery   . Vaginal hysterectomy,cystocele and prolapse  repair NOV. 2008    Family History  Problem Relation Age of Onset  . Diabetes Brother   . Hypertension Mother   . Leukemia Brother   . Cancer Brother   . Stroke Mother   . Diabetes Father     Social History:  reports that she has never smoked. She does not have any smokeless tobacco history on file. She reports that she does not drink alcohol. Her drug history not on file.  Allergies:  Allergies  Allergen Reactions  . Latex   . Penicillins   . Tenivac     Medications: I have reviewed the patient's current medications.  Results for orders placed during the hospital encounter of 12/24/10 (from the past 48 hour(s))  CBC     Status: Abnormal   Collection Time   01/03/11  5:00 AM      Component Value Range Comment   WBC 13.4 (*) 4.0 - 10.5 (K/uL)    RBC 4.09  3.87 - 5.11 (MIL/uL)    Hemoglobin 12.2  12.0 - 15.0 (g/dL)    HCT 14.7  82.9 - 56.2 (%)    MCV 89.2  78.0 - 100.0 (fL)    MCH 29.8  26.0 - 34.0 (pg)    MCHC 33.4  30.0 - 36.0 (g/dL)    RDW 13.0  86.5 - 78.4 (%)    Platelets 504 (*) 150 - 400 (K/uL)   DIFFERENTIAL      Status: Abnormal   Collection Time   01/03/11  5:00 AM      Component Value Range Comment   Neutrophils Relative 78 (*) 43 - 77 (%)    Lymphocytes Relative 13  12 - 46 (%)    Monocytes Relative 8  3 - 12 (%)    Eosinophils Relative 0  0 - 5 (%)    Basophils Relative 0  0 - 1 (%)    Band Neutrophils 1  0 - 10 (%)    Metamyelocytes Relative 0      Myelocytes 0      Promyelocytes Absolute 0      Blasts 0      nRBC 0  0 (/100 WBC)    Neutro Abs 10.6 (*) 1.7 - 7.7 (K/uL)    Lymphs Abs 1.7  0.7 - 4.0 (K/uL)    Monocytes Absolute 1.1 (*) 0.1 - 1.0 (K/uL)    Eosinophils Absolute 0.0  0.0 - 0.7 (K/uL)    Basophils Absolute 0.0  0.0 - 0.1 (K/uL)  WBC Morphology ATYPICAL LYMPHOCYTES     BASIC METABOLIC PANEL     Status: Abnormal   Collection Time   01/03/11  5:00 AM      Component Value Range Comment   Sodium 129 (*) 135 - 145 (mEq/L)    Potassium 4.5  3.5 - 5.1 (mEq/L)    Chloride 97  96 - 112 (mEq/L)    CO2 26  19 - 32 (mEq/L)    Glucose, Bld 135 (*) 70 - 99 (mg/dL)    BUN 22  6 - 23 (mg/dL)    Creatinine, Ser 1.61  0.50 - 1.10 (mg/dL)    Calcium 8.7  8.4 - 10.5 (mg/dL)    GFR calc non Af Amer 83 (*) >90 (mL/min)    GFR calc Af Amer >90  >90 (mL/min)   GLUCOSE, CAPILLARY     Status: Abnormal   Collection Time   01/03/11  7:24 AM      Component Value Range Comment   Glucose-Capillary 122 (*) 70 - 99 (mg/dL)    Comment 1 Documented in Chart      Comment 2 Notify RN     CBC     Status: Abnormal   Collection Time   01/04/11  5:46 AM      Component Value Range Comment   WBC 15.3 (*) 4.0 - 10.5 (K/uL)    RBC 3.96  3.87 - 5.11 (MIL/uL)    Hemoglobin 11.8 (*) 12.0 - 15.0 (g/dL)    HCT 09.6 (*) 04.5 - 46.0 (%)    MCV 88.6  78.0 - 100.0 (fL)    MCH 29.8  26.0 - 34.0 (pg)    MCHC 33.6  30.0 - 36.0 (g/dL)    RDW 40.9  81.1 - 91.4 (%)    Platelets 467 (*) 150 - 400 (K/uL)   DIFFERENTIAL     Status: Abnormal   Collection Time   01/04/11  5:46 AM      Component Value Range  Comment   Neutrophils Relative 73  43 - 77 (%)    Lymphocytes Relative 18  12 - 46 (%)    Monocytes Relative 9  3 - 12 (%)    Eosinophils Relative 0  0 - 5 (%)    Basophils Relative 0  0 - 1 (%)    Neutro Abs 11.1 (*) 1.7 - 7.7 (K/uL)    Lymphs Abs 2.8  0.7 - 4.0 (K/uL)    Monocytes Absolute 1.4 (*) 0.1 - 1.0 (K/uL)    Eosinophils Absolute 0.0  0.0 - 0.7 (K/uL)    Basophils Absolute 0.0  0.0 - 0.1 (K/uL)    WBC Morphology ATYPICAL LYMPHOCYTES     BASIC METABOLIC PANEL     Status: Abnormal   Collection Time   01/04/11  5:46 AM      Component Value Range Comment   Sodium 129 (*) 135 - 145 (mEq/L)    Potassium 4.1  3.5 - 5.1 (mEq/L)    Chloride 96  96 - 112 (mEq/L)    CO2 28  19 - 32 (mEq/L)    Glucose, Bld 96  70 - 99 (mg/dL)    BUN 19  6 - 23 (mg/dL)    Creatinine, Ser 7.82  0.50 - 1.10 (mg/dL)    Calcium 8.4  8.4 - 10.5 (mg/dL)    GFR calc non Af Amer 84 (*) >90 (mL/min)    GFR calc Af Amer >90  >90 (mL/min)   GLUCOSE, CAPILLARY  Status: Abnormal   Collection Time   01/04/11  7:25 AM      Component Value Range Comment   Glucose-Capillary 107 (*) 70 - 99 (mg/dL)    Comment 1 Notify RN       Ct Chest W Contrast  01/04/2011  *RADIOLOGY REPORT*  Clinical Data: Left sided pleuritic chest pain.  CT CHEST WITH CONTRAST  Technique:  Multidetector CT imaging of the chest was performed following the standard protocol during bolus administration of intravenous contrast.  Contrast:  Chest radiograph 01/03/2011.  Comparison: 100 ml Omnipaque-300.  Findings: No pathologically enlarged mediastinal, hilar or axillary lymph nodes.  Heart is mildly enlarged.  No pericardial effusion. Tiny hiatal hernia.  Areas of central bronchiectasis and volume loss are seen bilaterally.  Minimal septal thickening at the lung bases, right greater than left, unchanged from prior exams.  No pleural fluid. Airway is unremarkable.  Incidental imaging of the upper abdomen shows a 2.9 cm low attenuation lesion in  the left kidney, which is incompletely imaged.  No worrisome lytic or sclerotic lesions.  IMPRESSION:  No findings to explain the patient's given symptoms.  Original Report Authenticated By: Reyes Ivan, M.D.   Dg Chest Port 1 View  01/03/2011  *RADIOLOGY REPORT*  Clinical Data: Cough  PORTABLE CHEST - 1 VIEW  Comparison: 01/01/2011  Findings: Right arm PICC extends to the proximal SVC. Improved aeration, with residual linear scarring or atelectasis in the mid and lower left lung.  Right lung clear.  No effusion.  Stable mild cardiomegaly.  IMPRESSION: Improved aeration with minimal residual left perihilar scarring or atelectasis.  Original Report Authenticated By: Thora Lance III, M.D.    ROS Blood pressure 125/63, pulse 65, temperature 98.4 F (36.9 C), temperature source Oral, resp. rate 18, height 5\' 1"  (1.549 m), weight 94.5 kg (208 lb 5.4 oz), SpO2 96.00%.    Physical Exam  Constitutional: She appears well-developed and well-nourished.  HENT:  Mouth/Throat:    Skin: Skin is warm. Rash noted. There is erythema.          Erythematous plaques with tense bulla..4-3mm in diameter   On PE, facial edema and lip swelling is much improved as compared to 4 days ago. Oral exam reveals two superficial ulcerations on inner upper lip area..4mm and 6mm. Otherwise, clear. Hands improved with minimal erythema. No vesicles or targetoid lesions present. Bilat UE continue to have erythematous plaques approx 2 cm in size with 3-64mm vesicles and 1 cm bulla. LE and trunk clear.   Assessment/Plan:H/o facial angioedema and lip swelling with erythematous plaques and bulla on bilat UE...secondary to allergic drug reaction vs contact dermatitis vs bullous pemphigoid? Angioedema not a characteristic of BP. Pt may have another process going on to give elevated wbc ct.  Punch biopsy was performed on right UE. See procedure note. 1%lidocaine with epi used to anesthetize. One specimen was sent for H&E,  2nd specimen sent for DIF (direct immunoflouresence). Sutures need to be removed in 10 days. Local woundcare with vaseline + band-aid. Specimens were sent to [facility].  Continue solumedrol, antihistamines, and topical triamcinolone to all affected areas.  [name] Y 01/04/2011, 6:41 PM

## 2011-01-04 NOTE — Procedures (Signed)
1% lidocaine with epi used to anesthetize area. 4 mm punch biopsy performed x 2 to area on right UE. Two specimens sent to [facility]. 4-0 nylon suture used to close site. Sutures need to be removed in 10 days. Local woundcare with vaseline + band-aid QD.

## 2011-01-04 NOTE — Progress Notes (Addendum)
[name] [id number] is a 76 y.o. female,  Outpatient Primary MD for the patient is No primary provider on file.  Chief Complaint  Patient presents with  . Rectal Bleeding        Subjective:   [name] today has, No headache, Mild L.Pleuritic chest pain, No abdominal pain - No Nausea, No new weakness tingling or numbness, No Cough - SOB.   Objective:   Filed Vitals:   01/04/11 0500 01/04/11 0800 01/04/11 0822 01/04/11 1033  BP:   121/77 118/70  Pulse:  68 73   Temp: 98.2 F (36.8 C) 98.5 F (36.9 C)    TempSrc: Oral Oral    Resp:      Height:      Weight: 94.5 kg (208 lb 5.4 oz)     SpO2:  97%      Wt Readings from Last 3 Encounters:  01/04/11 94.5 kg (208 lb 5.4 oz)  09/03/10 95.482 kg (210 lb 8 oz)  06/04/10 95.437 kg (210 lb 6.4 oz)     Intake/Output Summary (Last 24 hours) at 01/04/11 1209 Last data filed at 01/04/11 0800  Gross per 24 hour  Intake   1830 ml  Output   1450 ml  Net    380 ml    Exam Awake Alert, Oriented *3, No new F.N deficits, Normal affect [place].AT,PERRAL Supple Neck,No JVD, No cervical lymphadenopathy appriciated.  Symmetrical Chest wall movement, Good air movement bilaterally, CTAB RRR,No Gallops,Rubs or new Murmurs, No Parasternal Heave +ve B.Sounds, Abd Soft, Non tender, No organomegaly appriciated, No rebound -guarding or rigidity. No Cyanosis, Clubbing or edema, No new Rash or bruise.  Data Review  CBC  Lab 01/04/11 0546 01/03/11 0500 01/02/11 0550 01/01/11 0600 12/31/10 0630  WBC 15.3* 13.4* 7.7 5.0 2.4*  HGB 11.8* 12.2 12.4 12.7 12.0  HCT 35.1* 36.5 37.0 38.1 35.7*  PLT 467* 504* 561* 629* 572*  MCV 88.6 89.2 89.6 89.0 89.5  MCH 29.8 29.8 30.0 29.7 30.1  MCHC 33.6 33.4 33.5 33.3 33.6  RDW 13.4 13.6 13.7 13.5 13.6  LYMPHSABS 2.8 1.7 1.0 0.9 0.5*  MONOABS 1.4* 1.1* 0.6 0.4 0.1  EOSABS 0.0 0.0 0.0 0.0 0.0  BASOSABS 0.0 0.0 0.0 0.0 0.0  BANDABS -- -- -- -- --    Chemistries   Lab 01/04/11 0546  01/03/11 0500 01/02/11 0550 01/01/11 0600 12/31/10 0630  NA 129* 129* 133* 131* 130*  K 4.1 4.5 4.4 3.7 4.8  CL 96 97 97 93* 95*  CO2 28 26 26 26 24   GLUCOSE 96 135* 126* 139* 141*  BUN 19 22 24* 25* 25*  CREATININE 0.67 0.72 0.78 0.73 0.90  CALCIUM 8.4 8.7 9.2 9.5 9.7  MG -- -- -- -- --  AST -- -- -- -- --  ALT -- -- -- -- --  ALKPHOS -- -- -- -- --  BILITOT -- -- -- -- --   ------------------------------------------------------------------------------------------------------------------ estimated creatinine clearance is 64.8 ml/min (by C-G formula based on Cr of 0.67). ------------------------------------------------------------------------------------------------------------------  Micro Results Recent Results (from the past 240 hour(s))  URINE CULTURE     Status: Normal   Collection Time   12/27/10  7:34 PM      Component Value Range Status Comment   Specimen Description URINE, RANDOM   Final    Special Requests NONE   Final    Setup Time [id number]   Final    Colony Count >=100,000 COLONIES/ML   Final    Culture  Final    Value: Multiple bacterial morphotypes present, none predominant. Suggest appropriate recollection if clinically indicated.   Report Status 12/29/2010 FINAL   Final   PATHOLOGIST SMEAR REVIEW     Status: Normal   Collection Time   12/30/10  9:04 AM      Component Value Range Status Comment   Tech Review MARKED ABSOLUTE NEUTROPENIA WITHOUT   Final   CULTURE, SPUTUM-ASSESSMENT     Status: Normal   Collection Time   12/31/10  1:31 PM      Component Value Range Status Comment   Specimen Description SPUTUM   Final    Special Requests NONE   Final    Sputum evaluation     Final    Value: MICROSCOPIC FINDINGS SUGGEST THAT THIS SPECIMEN IS NOT REPRESENTATIVE OF LOWER RESPIRATORY SECRETIONS. PLEASE RECOLLECT.     CALLED TO A AFATFAWO, RN 12/31/10 1413 BY K SCHULTZ   Report Status 12/31/2010 FINAL   Final     Radiology Reports  Dg Chest 2  View  12/29/2010  *RADIOLOGY REPORT*  Clinical Data: Cough and congestion  CHEST - 2 VIEW  Comparison: 12/24/2010  Findings: Heart size appears normal.  No pleural effusion or pulmonary edema.  The lung volumes are decreased.  There is persistent bilateral perihilar opacities which may be related to infiltrates and/or atelectasis.  Review of the visualized osseous structures is unremarkable.  IMPRESSION:  1.  No significant change in bilateral perihilar opacities.  Original Report Authenticated By: Rosealee Albee, M.D.   Dg Chest 2 View  12/25/2010  *RADIOLOGY REPORT*  Clinical Data: 76 year old female with cough, shortness of breath, pain, bright red blood per rectum.  CHEST - 2 VIEW  Comparison: 09/28/2010 and earlier.  Findings: Stable cardiomegaly and mediastinal contours.  Hilar prominence is stable and probably relates to central vascular enlargement.  The lungs are clear. No acute osseous abnormality identified.  IMPRESSION: Stable cardiomegaly.  No acute cardiopulmonary abnormality.  Original Report Authenticated By: Harley Hallmark, M.D.   Ct Abdomen Pelvis W Contrast  12/27/2010  *RADIOLOGY REPORT*  Clinical Data: Lower abdominal pain, constipation, history of diverticulosis, bladder prolapse, and prior hysterectomy  CT ABDOMEN AND PELVIS WITH CONTRAST  Technique:  Multidetector CT imaging of the abdomen and pelvis was performed following the standard protocol during bolus administration of intravenous contrast.  Contrast: OMNIPAQUE IOHEXOL 300 MG/ML IV SOLN  Comparison: 09/30/2010  Findings: Motion degraded images.  Mild patchy opacity / scarring at the lung bases.  Liver, spleen, pancreas, and adrenal jets are within normal limits.  Gallbladder is unremarkable.  No intrahepatic or extrahepatic ductal dilatation.  Bilateral renal cysts.  No hydronephrosis.  No evidence of bowel obstruction.  Colonic diverticulosis, without associated inflammatory changes.  Atherosclerotic calcifications  of the abdominal aorta and branch vessels.  No abdominopelvic ascites.  No suspicious abdominopelvic lymphadenopathy.  Status post hysterectomy.  Ovaries are unremarkable.  Bladder is within normal limits.  Tiny fat containing inguinal hernias.  Degenerative changes of the visualized thoracolumbar spine.   IMPRESSION: Motion degraded images.  No evidence of bowel obstruction.  Colonic diverticulosis, without associated inflammatory changes.  No CT findings to account for the patient's abdominal pain.  Original Report Authenticated By: Charline Bills, M.D.   Dg Chest Port 1 View  01/03/2011  *RADIOLOGY REPORT*  Clinical Data: Cough  PORTABLE CHEST - 1 VIEW  Comparison: 01/01/2011  Findings: Right arm PICC extends to the proximal SVC. Improved aeration, with residual linear scarring or  atelectasis in the mid and lower left lung.  Right lung clear.  No effusion.  Stable mild cardiomegaly.  IMPRESSION: Improved aeration with minimal residual left perihilar scarring or atelectasis.  Original Report Authenticated By: Osa Craver, M.D.   Chest Portable 1 View Post Insertion To Confirm Placement As Interpreted By Radiologist  01/01/2011  *RADIOLOGY REPORT*  Clinical Data: PICC line placement.  PORTABLE CHEST - 1 VIEW  Comparison: 12/31/2010  Findings: Right-sided PICC line noted the tip projecting over the SVC.  No pneumothorax.  Mildly improved perihilar opacities noted.  Mild cardiomegaly is present with tortuosity of the thoracic aorta.  There is linear subsegmental atelectasis at both lung bases.  IMPRESSION:  1.  Right PICC line tip:  SVC.  No pneumothorax. 2.  Reduced perihilar airspace opacity. 3.  Mild bibasilar subsegmental atelectasis. 4.  Mild cardiomegaly.  Original Report Authenticated By: Dellia Cloud, M.D.   Dg Chest Port 1 View  12/31/2010  *RADIOLOGY REPORT*  Clinical Data: Cough.  Fever.  Neutropenia.  PORTABLE CHEST - 1 VIEW  Comparison: 12/29/2010  Findings: Interval  worsening of bilateral perihilar air space disease is seen compared to prior exam.  Peripheral atelectasis or scarring in the midlung zones is unchanged.  Cardiomegaly is stable.  No pleural effusions identified.  IMPRESSION: Interval worsening of bilateral perihilar air space disease.  Original Report Authenticated By: Danae Orleans, M.D.   Dg Abd 2 Views  12/25/2010  *RADIOLOGY REPORT*  Clinical Data: 76 year old female with abdominal pain.  Bright red blood from rectum.  ABDOMEN - 2 VIEW  Comparison: CT abdomen pelvis 09/30/2010 and earlier.  Findings: Supine and left-side down lateral decubitus views.  No pneumoperitoneum. Nonobstructed bowel gas pattern.  Lung bases appear normal. No acute osseous abnormality identified.  Chronic pelvic phleboliths.  IMPRESSION: Nonobstructed bowel gas pattern, no free air.  Original Report Authenticated By: Ulla Potash III, M.D.    Scheduled Meds:   . amLODipine  10 mg Oral Daily  . azithromycin  500 mg Oral Daily  . carvedilol  6.25 mg Oral BID WC  . colchicine  0.6 mg Oral BID  . docusate sodium  100 mg Oral BID  . feeding supplement  1 Container Oral TID BM  . methylPREDNISolone (SOLU-MEDROL) injection  40 mg Intravenous Q24H  . polyethylene glycol  17 g Oral Daily  . potassium chloride SA  20 mEq Oral Daily  . sodium chloride  10 mL Intracatheter Q12H  . triamcinolone cream   Topical TID   Continuous Infusions:  PRN Meds:.acetaminophen, acetaminophen, HYDROcodone-acetaminophen, magnesium hydroxide, ondansetron (ZOFRAN) IV, sodium chloride, sodium chloride   Assessment & Plan   1. Generalized Facial swelling with Whelps - few old sores on oral mucosa.    Unclear etiology, ? Autoimmune phenomenon ? Early steven Johnsons, ? H/O Vasculitis (Scleritis) in the past - Derm Dr [place] seen pt 12-31-10 D/W her agrees with present Rx. Have minimized Meds, DC'd ASA, continue PRN IV Benadryl , since improving  reduced IV steroids and DC Zantac, continue Po  Colchicine, Airway remains stable ,D/W Dr  Nickola Major Rheum 01-04-11 she requests re derm eval , called 01-04-11 Dr [place] D/W her she will do a skin biopsy .  Pt improving slowly facial  swelling better, no new breakouts 01-04-11.    2. Leukopenia / Neutropenia , Thrombocytosis - unclear etiology. Noted input from Dr Gwenyth Bouillon Haem , Neutropenia resolved with steroids, etiology remains unclear? Immune phenomenon. Smear inconclusive. WBC now little higher likely  steroid induced, stable UA- CXR. Afebrile. Viral serology noted D/W Dr Drue Second - will check CMV PCR ID will see her today.   3. HYPERTENSION - increased home B blcoker, added Norvasc 01-01-11 for better control, stable.  Filed Vitals:   01/04/11 0500 01/04/11 0800 01/04/11 0822 01/04/11 1033  BP:   121/77 118/70  Pulse:  68 73   Temp: 98.2 F (36.8 C) 98.5 F (36.9 C)    TempSrc: Oral Oral    Resp:      Height:      Weight: 94.5 kg (208 lb 5.4 oz)     SpO2:  97%       4. Hematochezia - now resolved, pt's Hg/Hct remain stable and at pt's baseline , monitor.   5. CONSTIPATION, CHRONIC , Gen Abd Pain - continue and ensure that pt has regular bowel regimen , GI Input noted, Tap water Enema x 1 on 01-03-11 , on 01-04-11 mild intermittent Pl Chest pain says has it off and on No SOB, will get CT to R/O PE 01-04-11  6. Hyponatremia - pt's baseline is 127-132 , Ur Na 40, Po intake better, DC IVF, monitor Na closely. Symptom free.  7. Mild Cough - Azithro started 12-31-10 per Dr Synthia Innocent - cough better. CXR better.   Asp precautions   DVT Prophylaxis SCDs  See all Orders from today for further details

## 2011-01-04 NOTE — Progress Notes (Signed)
Physical Therapy Treatment Patient Details Name: [name] MRN: [id number] DOB: 06/03/1934 Today's Date: 01/04/2011  PT Assessment/Plan  PT - Assessment/Plan Comments on Treatment Session: pt self-limiting PT Plan: Discharge plan remains appropriate Follow Up Recommendations: Home health PT Equipment Recommended: None recommended by PT PT Goals  Acute Rehab PT Goals PT Goal: Supine/Side to Sit - Progress: Progressing toward goal PT Transfer Goal: Sit to Stand/Stand to Sit - Progress: Progressing toward goal PT Transfer Goal: Bed to Chair/Chair to Bed - Progress: Progressing toward goal PT Goal: Ambulate - Progress:  (not addressed pt deferred) PT Goal: Up/Down Stairs - Progress:  (not addressed today)  PT Treatment Precautions/Restrictions  Precautions Precautions: Fall Restrictions Weight Bearing Restrictions: No Mobility (including Balance) Bed Mobility Supine to Sit: 4: Min assist;With rails;HOB elevated (Comment degrees) (2525 degrees) Supine to Sit Details (indicate cue type and reason): vc for use of UE's to push instead of pull Sitting - Scoot to Edge of Bed: Other (comment) (min guard assist) Transfers Sit to Stand: 4: Min assist;From bed;With upper extremity assist Sit to Stand Details (indicate cue type and reason): vc's for hand placement, manual A for forward w/shift Stand Pivot Transfers: 4: Min assist Ambulation/Gait Ambulation/Gait: No Ambulation/Gait Assistance Details (indicate cue type and reason): pt deferred ambulation today after multiple attempts to get her to do some activity, but she only would get up to the [facility]    Exercise    End of Session PT - End of Session Activity Tolerance: Patient limited by fatigue Patient left: Other (comment);with call bell in reach (on bsc) Nurse Communication: Mobility status for transfers General Behavior During Session: Other (comment) (apathetic, needed lots of encouragement to participate `)  [name],  Eliseo Gum 01/04/2011, 2:21 PM  01/04/2011  [place] [facility], PT [phone] (pager)

## 2011-01-05 ENCOUNTER — Other Ambulatory Visit: Payer: Self-pay | Admitting: Internal Medicine

## 2011-01-05 DIAGNOSIS — R1084 Generalized abdominal pain: Secondary | ICD-10-CM

## 2011-01-05 DIAGNOSIS — J189 Pneumonia, unspecified organism: Secondary | ICD-10-CM

## 2011-01-05 LAB — CBC
Hemoglobin: 11.7 g/dL — ABNORMAL LOW (ref 12.0–15.0)
MCH: 29.7 pg (ref 26.0–34.0)
MCV: 89.3 fL (ref 78.0–100.0)
RBC: 3.94 MIL/uL (ref 3.87–5.11)
WBC: 12.6 10*3/uL — ABNORMAL HIGH (ref 4.0–10.5)

## 2011-01-05 LAB — COLD AGGLUTININ TITER

## 2011-01-05 LAB — BASIC METABOLIC PANEL
CO2: 28 mEq/L (ref 19–32)
Calcium: 8 mg/dL — ABNORMAL LOW (ref 8.4–10.5)
Chloride: 95 mEq/L — ABNORMAL LOW (ref 96–112)
Glucose, Bld: 103 mg/dL — ABNORMAL HIGH (ref 70–99)
Sodium: 128 mEq/L — ABNORMAL LOW (ref 135–145)

## 2011-01-05 LAB — DIFFERENTIAL
Basophils Relative: 0 % (ref 0–1)
Eosinophils Relative: 1 % (ref 0–5)
Monocytes Absolute: 0.8 10*3/uL (ref 0.1–1.0)
Monocytes Relative: 6 % (ref 3–12)
Neutrophils Relative %: 74 % (ref 43–77)

## 2011-01-05 LAB — VARICELLA-ZOSTER BY PCR: Varicella-Zoster, PCR: NOT DETECTED

## 2011-01-05 MED ORDER — IOHEXOL 300 MG/ML  SOLN
100.0000 mL | Freq: Once | INTRAMUSCULAR | Status: AC | PRN
  Administered 2011-01-05: 100 mL via INTRAVENOUS

## 2011-01-05 MED ORDER — SODIUM CHLORIDE 0.9 % IV SOLN
INTRAVENOUS | Status: AC
  Administered 2011-01-05: 10:00:00 via INTRAVENOUS

## 2011-01-05 NOTE — Progress Notes (Signed)
Physical Therapy Treatment Patient Details Name: [name] MRN: [id number] DOB: 1934-05-05 Today's Date: 01/05/2011  PT Assessment/Plan  PT - Assessment/Plan Comments on Treatment Session: Again very self limiting, but she has the ability to progress PT Plan: Discharge plan needs to be updated Follow Up Recommendations: Skilled nursing facility (I think she will need some short term rehab now since she has not made as much progress as expected and lives alone Equipment Recommended: Defer to next venue PT Goals  Acute Rehab PT Goals PT Goal Formulation: With patient PT Goal: Supine/Side to Sit - Progress: Other (comment) (not addressed today) PT Goal: Sit to Supine/Side - Progress: Other (comment) (not addressed today) PT Transfer Goal: Sit to Stand/Stand to Sit - Progress: Other (comment) (progressing very slowly) PT Transfer Goal: Bed to Chair/Chair to Bed - Progress: Progressing toward goal PT Goal: Ambulate - Progress: Progressing toward goal (progressing more slowly than expected) PT Goal: Up/Down Stairs - Progress: Other (comment) (not addressed today)  PT Treatment Precautions/Restrictions  Precautions Precautions: Fall Restrictions Weight Bearing Restrictions: No Mobility (including Balance) Bed Mobility Bed Mobility: No Transfers Sit to Stand: 4: Min assist;From chair/3-in-1 Sit to Stand Details (indicate cue type and reason): vc's to get fully to the edge of the chair;vc's for hand placement and manual A for forward w/shift Stand to Sit: 4: Min assist;With upper extremity assist;To chair/3-in-1 Ambulation/Gait Ambulation/Gait: Yes Ambulation/Gait Assistance: 4: Min assist;Other (comment) (max encouragement) Ambulation/Gait Assistance Details (indicate cue type and reason): plodding, weak gait; vc's for postural checks, improved use of the RW Ambulation Distance (Feet): 12 Feet (then 24' after a short rest) Assistive device: Rolling walker Gait Pattern:  Shuffle;Step-through pattern;Right foot flat Gait velocity:  (very slow cadence)    Exercise    End of Session PT - End of Session Activity Tolerance: Patient limited by fatigue;Other (comment) (lack of motivation to do any activity) Patient left: in chair Nurse Communication: Mobility status for ambulation General Behavior During Session: Other (comment) (apathetic) Cognition: WFL for tasks performed  Amos Gaber, Eliseo Gum 01/05/2011, 12:32 PM  01/05/2011  [place], PT [phone] (pager)

## 2011-01-05 NOTE — Plan of Care (Signed)
Problem: Phase III Progression Outcomes Goal: Activity at appropriate level-compared to baseline (UP IN CHAIR FOR HEMODIALYSIS)  Outcome: Progressing Progressing slower than expected, need to switch D/C plan to SNF

## 2011-01-05 NOTE — Progress Notes (Addendum)
[name] [id number] is a 76 y.o. female,  Outpatient Primary MD for the patient is No primary provider on file.  Chief Complaint  Patient presents with  . Rectal Bleeding        Subjective:   [name] today has, No headache, Mild L.Pleuritic chest pain, No abdominal pain - No Nausea, No new weakness tingling or numbness, No Cough - SOB.   Objective:   Blood pressure 135/67, pulse 70, temperature 99 F (37.2 C), temperature source Oral, resp. rate 18, height 5\' 1"  (1.549 m), weight 97.4 kg (214 lb 11.7 oz), SpO2 95.00%.   Wt Readings from Last 3 Encounters:  01/05/11 97.4 kg (214 lb 11.7 oz)  09/03/10 95.482 kg (210 lb 8 oz)  06/04/10 95.437 kg (210 lb 6.4 oz)     Intake/Output Summary (Last 24 hours) at 01/05/11 0926 Last data filed at 01/05/11 0419  Gross per 24 hour  Intake    790 ml  Output   1400 ml  Net   -610 ml    Exam Awake Alert, Oriented *3, No new F.N deficits, Normal affect Lynch.AT,PERRAL Supple Neck,No JVD, No cervical lymphadenopathy appriciated.  Symmetrical Chest wall movement, Good air movement bilaterally, CTAB RRR,No Gallops,Rubs or new Murmurs, No Parasternal Heave +ve B.Sounds, Abd Soft, Non tender, No organomegaly appriciated, No rebound -guarding or rigidity. No Cyanosis, Clubbing or edema, No new Rash or bruise.  Data Review  CBC  Lab 01/05/11 0500 01/04/11 0546 01/03/11 0500 01/02/11 0550 01/01/11 0600  WBC 12.6* 15.3* 13.4* 7.7 5.0  HGB 11.7* 11.8* 12.2 12.4 12.7  HCT 35.2* 35.1* 36.5 37.0 38.1  PLT 468* 467* 504* 561* 629*  MCV 89.3 88.6 89.2 89.6 89.0  MCH 29.7 29.8 29.8 30.0 29.7  MCHC 33.2 33.6 33.4 33.5 33.3  RDW 13.5 13.4 13.6 13.7 13.5  LYMPHSABS 2.4 2.8 1.7 1.0 0.9  MONOABS 0.8 1.4* 1.1* 0.6 0.4  EOSABS 0.1 0.0 0.0 0.0 0.0  BASOSABS 0.0 0.0 0.0 0.0 0.0  BANDABS -- -- -- -- --    Chemistries   Lab 01/05/11 0500 01/04/11 0546 01/03/11 0500 01/02/11 0550 01/01/11 0600  NA 128* 129* 129* 133* 131*  K  3.9 4.1 4.5 4.4 3.7  CL 95* 96 97 97 93*  CO2 28 28 26 26 26   GLUCOSE 103* 96 135* 126* 139*  BUN 16 19 22  24* 25*  CREATININE 0.62 0.67 0.72 0.78 0.73  CALCIUM 8.0* 8.4 8.7 9.2 9.5  MG -- -- -- -- --  AST -- -- -- -- --  ALT -- -- -- -- --  ALKPHOS -- -- -- -- --  BILITOT -- -- -- -- --   ------------------------------------------------------------------------------------------------------------------ estimated creatinine clearance is 65.8 ml/min (by C-G formula based on Cr of 0.62). ------------------------------------------------------------------------------------------------------------------  Micro Results Recent Results (from the past 240 hour(s))  URINE CULTURE     Status: Normal   Collection Time   12/27/10  7:34 PM      Component Value Range Status Comment   Specimen Description URINE, RANDOM   Final    Special Requests NONE   Final    Setup Time [id number]   Final    Colony Count >=100,000 COLONIES/ML   Final    Culture     Final    Value: Multiple bacterial morphotypes present, none predominant. Suggest appropriate recollection if clinically indicated.   Report Status 12/29/2010 FINAL   Final   PATHOLOGIST SMEAR REVIEW     Status: Normal   Collection  Time   12/30/10  9:04 AM      Component Value Range Status Comment   Tech Review MARKED ABSOLUTE NEUTROPENIA WITHOUT   Final   CULTURE, SPUTUM-ASSESSMENT     Status: Normal   Collection Time   12/31/10  1:31 PM      Component Value Range Status Comment   Specimen Description SPUTUM   Final    Special Requests NONE   Final    Sputum evaluation     Final    Value: MICROSCOPIC FINDINGS SUGGEST THAT THIS SPECIMEN IS NOT REPRESENTATIVE OF LOWER RESPIRATORY SECRETIONS. PLEASE RECOLLECT.     CALLED TO A AFATFAWO, RN 12/31/10 1413 BY K SCHULTZ   Report Status 12/31/2010 FINAL   Final     Radiology Reports  Dg Chest 2 View  12/29/2010  *RADIOLOGY REPORT*  Clinical Data: Cough and congestion  CHEST - 2 VIEW   Comparison: 12/24/2010  Findings: Heart size appears normal.  No pleural effusion or pulmonary edema.  The lung volumes are decreased.  There is persistent bilateral perihilar opacities which may be related to infiltrates and/or atelectasis.  Review of the visualized osseous structures is unremarkable.  IMPRESSION:  1.  No significant change in bilateral perihilar opacities.  Original Report Authenticated By: Rosealee Albee, M.D.   Dg Chest 2 View  12/25/2010  *RADIOLOGY REPORT*  Clinical Data: 76 year old female with cough, shortness of breath, pain, bright red blood per rectum.  CHEST - 2 VIEW  Comparison: 09/28/2010 and earlier.  Findings: Stable cardiomegaly and mediastinal contours.  Hilar prominence is stable and probably relates to central vascular enlargement.  The lungs are clear. No acute osseous abnormality identified.  IMPRESSION: Stable cardiomegaly.  No acute cardiopulmonary abnormality.  Original Report Authenticated By: Harley Hallmark, M.D.   Ct Abdomen Pelvis W Contrast  12/27/2010  *RADIOLOGY REPORT*  Clinical Data: Lower abdominal pain, constipation, history of diverticulosis, bladder prolapse, and prior hysterectomy  CT ABDOMEN AND PELVIS WITH CONTRAST  Technique:  Multidetector CT imaging of the abdomen and pelvis was performed following the standard protocol during bolus administration of intravenous contrast.  Contrast: OMNIPAQUE IOHEXOL 300 MG/ML IV SOLN  Comparison: 09/30/2010  Findings: Motion degraded images.  Mild patchy opacity / scarring at the lung bases.  Liver, spleen, pancreas, and adrenal jets are within normal limits.  Gallbladder is unremarkable.  No intrahepatic or extrahepatic ductal dilatation.  Bilateral renal cysts.  No hydronephrosis.  No evidence of bowel obstruction.  Colonic diverticulosis, without associated inflammatory changes.  Atherosclerotic calcifications of the abdominal aorta and branch vessels.  No abdominopelvic ascites.  No suspicious  abdominopelvic lymphadenopathy.  Status post hysterectomy.  Ovaries are unremarkable.  Bladder is within normal limits.  Tiny fat containing inguinal hernias.  Degenerative changes of the visualized thoracolumbar spine.   IMPRESSION: Motion degraded images.  No evidence of bowel obstruction.  Colonic diverticulosis, without associated inflammatory changes.  No CT findings to account for the patient's abdominal pain.  Original Report Authenticated By: Charline Bills, M.D.   Dg Chest Port 1 View  01/03/2011  *RADIOLOGY REPORT*  Clinical Data: Cough  PORTABLE CHEST - 1 VIEW  Comparison: 01/01/2011  Findings: Right arm PICC extends to the proximal SVC. Improved aeration, with residual linear scarring or atelectasis in the mid and lower left lung.  Right lung clear.  No effusion.  Stable mild cardiomegaly.  IMPRESSION: Improved aeration with minimal residual left perihilar scarring or atelectasis.  Original Report Authenticated By: Thora Lance III,  M.D.   Chest Portable 1 View Post Insertion To Confirm Placement As Interpreted By Radiologist  01/01/2011  *RADIOLOGY REPORT*  Clinical Data: PICC line placement.  PORTABLE CHEST - 1 VIEW  Comparison: 12/31/2010  Findings: Right-sided PICC line noted the tip projecting over the SVC.  No pneumothorax.  Mildly improved perihilar opacities noted.  Mild cardiomegaly is present with tortuosity of the thoracic aorta.  There is linear subsegmental atelectasis at both lung bases.  IMPRESSION:  1.  Right PICC line tip:  SVC.  No pneumothorax. 2.  Reduced perihilar airspace opacity. 3.  Mild bibasilar subsegmental atelectasis. 4.  Mild cardiomegaly.  Original Report Authenticated By: Dellia Cloud, M.D.   Dg Chest Port 1 View  12/31/2010  *RADIOLOGY REPORT*  Clinical Data: Cough.  Fever.  Neutropenia.  PORTABLE CHEST - 1 VIEW  Comparison: 12/29/2010  Findings: Interval worsening of bilateral perihilar air space disease is seen compared to prior exam.   Peripheral atelectasis or scarring in the midlung zones is unchanged.  Cardiomegaly is stable.  No pleural effusions identified.  IMPRESSION: Interval worsening of bilateral perihilar air space disease.  Original Report Authenticated By: Danae Orleans, M.D.   Dg Abd 2 Views  12/25/2010  *RADIOLOGY REPORT*  Clinical Data: 76 year old female with abdominal pain.  Bright red blood from rectum.  ABDOMEN - 2 VIEW  Comparison: CT abdomen pelvis 09/30/2010 and earlier.  Findings: Supine and left-side down lateral decubitus views.  No pneumoperitoneum. Nonobstructed bowel gas pattern.  Lung bases appear normal. No acute osseous abnormality identified.  Chronic pelvic phleboliths.  IMPRESSION: Nonobstructed bowel gas pattern, no free air.  Original Report Authenticated By: Ulla Potash III, M.D.    Scheduled Meds:    . amLODipine  10 mg Oral Daily  . azithromycin  500 mg Oral Daily  . carvedilol  6.25 mg Oral BID WC  . colchicine  0.6 mg Oral BID  . docusate sodium  100 mg Oral BID  . feeding supplement  1 Container Oral TID BM  . methylPREDNISolone (SOLU-MEDROL) injection  40 mg Intravenous Q24H  . polyethylene glycol  17 g Oral Daily  . potassium chloride SA  20 mEq Oral Daily  . sodium chloride  10 mL Intracatheter Q12H  . triamcinolone cream   Topical TID   Continuous Infusions:    . sodium chloride     PRN Meds:.acetaminophen, acetaminophen, HYDROcodone-acetaminophen, iohexol, iohexol, magnesium hydroxide, ondansetron (ZOFRAN) IV, sodium chloride, sodium chloride, traMADol   Assessment & Plan   1. Generalized Facial swelling with Whelps - few old sores on oral mucosa.    Unclear etiology, ? Autoimmune phenomenon ? Early steven Johnsons, ? H/O Vasculitis (Scleritis) in the past - Derm Dr [place] seen pt 12-31-10 D/W her agrees with present Rx. Have minimized Meds, DC'd ASA, continue PRN IV Benadryl , since improving  reduced IV steroids and DC Zantac, continue Po Colchicine, Airway remains  stable, D/W Dr  Nickola Major Rheum 01-04-11  Her consult noted, post skin biopsy 01-04-11 by Dr [place] remove stiches in 10 days, follow path report.  2. Leukopenia / Neutropenia , Thrombocytosis - unclear etiology. Noted input from Dr Gwenyth Bouillon Haem , Neutropenia resolved with steroids, etiology remains unclear? Immune phenomenon vs Viral infection. Peripheral Smear inconclusive. WBC now little higher likely steroid induced, stable UA- CXR. Afebrile. Viral serology noted D/W Dr Drue Second 01-04-11 she assured me she will see the patient , will remind her again today.  3. HYPERTENSION - increased home B blcoker, added Norvasc  01-01-11 for better control, stable.  4. Hematochezia - now resolved, pt's Hg/Hct remain stable and at pt's baseline , monitor.   5. CONSTIPATION, CHRONIC , Gen Abd Pain more LUQ - continue and ensure that pt has regular bowel regimen , GI Input noted,  Requested another visit again, Tap water Enema x 1 on 01-03-11 , had BM 01-04-11, still L sided chest/flank pain CT chest stable, D/W Radiologist L Renal cyst correlated with previous Abd CT no further imaging needed per radiology, monitor for any rash over L Flank.  6. Hyponatremia - pt's baseline is 127-132 , Ur Na 40, Po intake inconsistent, gentle IVF today .  7. Mild Cough - Azithro started 12-31-10 per Dr Synthia Innocent - cough better. CXR better.   Asp precautions   DVT Prophylaxis SCDs  See all Orders from today for further details

## 2011-01-05 NOTE — Progress Notes (Signed)
[name] Gi Daily Rounding Note 01/05/2011, 2:10 PM  SUBJECTIVE: Dr. Ella Jubilee asks for GI to resee pt for her c/o pain in left abdomen.  She can not tell me when it started.  It seems better to her after she eats.  She is often constipated, uses prn stool softeners and Miralax.  Last recorded BM was 11/25.  She is not nauseated.  Dr. Patterson Hammersmith and I saw her last week so please see the extensive consult of 11/21.  She has chronic abd pain worked up by Dr. Arlyce Dice, has had 4  as of yet unrevealing abdominal CTs since 08/2009 to evaluate her c/o abd pain.  Diverticlosis evident on the CT scans.  Have not been able to locate any prior colonoscopy reports ([facility] search, calls to [facility] and [facility] included), though [facility]'s office note of 2011 makes reference to a colonoscopy "3 years ago".  Currently claiming pain score of "10" though she is quite comfortable.  Nurse informs me the pt claims high pain scores even after she receives narcotics.  Her facial edema has improved, etiology not defined.  The associated dysphagia has also resolved, tolerating solid diet. Stools are not bloody.  OBJECTIVE: Looks chronically unwell.  She is comfortable  Vital signs in last 24 hours: Temp:  [97.5 F (36.4 C)-99 F (37.2 C)] 98.1 F (36.7 C) (11/27 0817) Pulse Rate:  [62-73] 68  (11/27 0938) Resp:  [18] 18  (11/26 1700) BP: (118-141)/(60-95) 126/60 mmHg (11/27 0937) SpO2:  [92 %-96 %] 94 % (11/27 1215) Weight:  [97.4 kg (214 lb 11.7 oz)] 214 lb 11.7 oz (97.4 kg) (11/27 0018) Last BM Date: 01/03/11  Heart: RRR Chest: Clear, reduced BS with poor inspiratory effort Abdomen: Diffusely mildly tender.  No guarding or rebound, belly soft, obese and not distended.  BS active.3 Extremities: no pedal edema Neuro/Psych:  Pleasant, cooperative.  Not confused.  Intake/Output from previous day: 11/26 0701 - 11/27 0700 In: 820 [P.O.:790; I.V.:20; IV Piggyback:10] Out: 1650  [Urine:1650]  Intake/Output this shift: Total I/O In: 70 [P.O.:60; I.V.:10] Out: 950 [Urine:950]  Lab Results:  Basename 01/05/11 0500 01/04/11 0546 01/03/11 0500  WBC 12.6* 15.3* 13.4*  HGB 11.7* 11.8* 12.2  HCT 35.2* 35.1* 36.5  PLT 468* 467* 504*   BMET  Basename 01/05/11 0500 01/04/11 0546 01/03/11 0500  NA 128* 129* 129*  K 3.9 4.1 4.5  CL 95* 96 97  CO2 28 28 26   GLUCOSE 103* 96 135*  BUN 16 19 22   CREATININE 0.62 0.67 0.72  CALCIUM 8.0* 8.4 8.7    Studies/Results: Ct Chest W Contrast  01/04/2011  *RADIOLOGY REPORT*  Clinical Data: Left sided pleuritic chest pain.  CT CHEST WITH CONTRAST  Technique:  Multidetector CT imaging of the chest was performed following the standard protocol during bolus administration of intravenous contrast.  Contrast:  Chest radiograph 01/03/2011.  Comparison: 100 ml Omnipaque-300.  Findings: No pathologically enlarged mediastinal, hilar or axillary lymph nodes.  Heart is mildly enlarged.  No pericardial effusion. Tiny hiatal hernia.  Areas of central bronchiectasis and volume loss are seen bilaterally.  Minimal septal thickening at the lung bases, right greater than left, unchanged from prior exams.  No pleural fluid. Airway is unremarkable.  Incidental imaging of the upper abdomen shows a 2.9 cm low attenuation lesion in the left kidney, which is incompletely imaged.  No worrisome lytic or sclerotic lesions.  IMPRESSION:  No findings to explain the patient's given symptoms.  Original Report Authenticated By: Reyes Ivan, M.D.    ASSESMENT: 1.  Chronic abd pain.  This has been present for at [facility] years.  She has other chronic pain issues, is felt to have fibromyalgia.  Rheum dz ruled out during last admission according to 11/16  H & P  And Dr. Nickola Major has found no evidence of rhematologic process.  Several CT scans demonstrate no worisome pathology, only diverticulosis and minor fat containing inguinal hernias. She seems to have an IBS-C  type picture. Though unable to locate a colonoscopy said to have been done in last 5 years,  given length of sxs and stable benign serial CT scans, negative CEA:  neoplasia not likely.   2.  Rectal bleeding at admission that has resolved.  Hemorrhoids seen on exam, known diverticulosis; either of which could be etiology.     3.  Unexplained facial edema, bullous rash:  Improved..  Skin Bx done 11/26. 4. Hyponatremia.   PLAN: 1.  Per Dr. Marina Goodell.    LOS: 12 days   Jennye Moccasin  01/05/2011, 2:10 PM Pager: [phone]   Previously seen and assessed by GI.History reviewed. No acute GI process at present. Chronic complaints clearly functional. No further recommendations or plans for additional GI workup as inpatient. I recommend that she follow up with her primary GI, Dr. Arlyce Dice, after discharge. Will sign off. Thanks

## 2011-01-06 LAB — CBC
HCT: 33.1 % — ABNORMAL LOW (ref 36.0–46.0)
Hemoglobin: 10.9 g/dL — ABNORMAL LOW (ref 12.0–15.0)
MCH: 29.5 pg (ref 26.0–34.0)
MCHC: 32.9 g/dL (ref 30.0–36.0)
MCV: 89.5 fL (ref 78.0–100.0)
RDW: 13.7 % (ref 11.5–15.5)

## 2011-01-06 LAB — BASIC METABOLIC PANEL
BUN: 16 mg/dL (ref 6–23)
Calcium: 8.1 mg/dL — ABNORMAL LOW (ref 8.4–10.5)
Chloride: 100 mEq/L (ref 96–112)
Creatinine, Ser: 0.68 mg/dL (ref 0.50–1.10)
GFR calc Af Amer: >90 mL/min (ref >90)
GFR calc non Af Amer: 84 mL/min — ABNORMAL LOW (ref >90)

## 2011-01-06 LAB — DIFFERENTIAL
Basophils Relative: 0 % (ref 0–1)
Eosinophils Absolute: 0.3 10*3/uL (ref 0.0–0.7)
Eosinophils Relative: 2 % (ref 0–5)
Monocytes Absolute: 0.6 10*3/uL (ref 0.1–1.0)
Monocytes Relative: 5 % (ref 3–12)

## 2011-01-06 LAB — HIV-1 RNA QUANT-NO REFLEX-BLD
HIV 1 RNA Quant: <20 copies/mL (ref <20)
HIV-1 RNA Quant, Log: <1.3 {Log} (ref <1.30)

## 2011-01-06 LAB — RSV(RESPIRATORY SYNCYTIAL VIRUS) AB, BLOOD

## 2011-01-06 MED ORDER — HYDROCODONE-ACETAMINOPHEN 5-325 MG PO TABS
1.0000 | ORAL_TABLET | ORAL | Status: DC | PRN
Start: 2011-01-06 — End: 2011-01-12
  Administered 2011-01-06 – 2011-01-12 (×25): 1 via ORAL
  Filled 2011-01-06 (×26): qty 1

## 2011-01-06 NOTE — Progress Notes (Signed)
UTILIZATION REVIEW COMPLETE PT CONTS STAY WITH IV STEROIDS AND PATHOLOGY REPORT PENDING, MAY NEED SNF AT DC. WILL F/U.   Willa Rough 01/06/2011 [phone] OR [phone]

## 2011-01-06 NOTE — Progress Notes (Signed)
Discussed in the long length of stay meeting [name] 01/06/2011  

## 2011-01-06 NOTE — Progress Notes (Signed)
Subjective:2 Patient complaints of generalized pain that she states is her baseline for her. She states that she suffers from arthritis and that her symptoms are worse in the winter.  Interval history: The patient has had some improvement in the presentation of the lesion of the skin. She had a punch biopsy done 2 days ago and he thinks may have sent yesterday by infectious diseases. Objective: Filed Vitals:   01/05/11 2000 01/06/11 0000 01/06/11 0400 01/06/11 0800  BP: 105/75 137/54 141/58   Pulse: 79 69 71   Temp: 97.7 F (36.5 C) 97.9 F (36.6 C) 98.1 F (36.7 C) 98.9 F (37.2 C)  TempSrc: Oral Oral Oral Oral  Resp: 23 20 20    Height:      Weight:   97.9 kg (215 lb 13.3 oz)   SpO2: 93% 93% 97%    Weight change: 0.5 kg (1 lb 1.6 oz)  Intake/Output Summary (Last 24 hours) at 01/06/11 0917 Last data filed at 01/06/11 1610  Gross per 24 hour  Intake   1440 ml  Output   3200 ml  Net  -1760 ml    General: Alert, awake, oriented x3, in no acute distress.  HEENT: Valeria/AT PEERL, EOMI Neck: Trachea midline,  no masses, no thyromegal,y no JVD, no carotid bruit OROPHARYNX:  Moist, mucous membranes are dry and cracked.Marland Kitchen  Heart: Regular rate and rhythm, without murmurs, rubs, gallops, PMI non-displaced, no heaves or thrills on palpation.  Lungs: Clear to auscultation, no wheezing or rhonchi noted. No increased vocal fremitus resonant to percussion  Abdomen: Soft, nontender, nondistended, positive bowel sounds, no masses no hepatosplenomegaly noted..  Neuro: No focal neurological deficits noted cranial nerves II through XII grossly intact. DTRs 2+ bilaterally upper and lower extremities. Strength 5 out of 5 in bilateral upper and lower extremities. Musculoskeletal: No warm swelling or erythema around joints, no spinal tenderness noted. Psychiatric: Patient alert and oriented x3, good insight and cognition, good recent to remote recall. Lymph node survey: No cervical axillary or inguinal  lymphadenopathy noted. Skin: The patient has what appear to be healing bullous lesions. There is no desquamation of the skin. Her oropharynx shows some mucus involvement.     Lab Results:  Basename 01/06/11 0615 01/05/11 0500  NA 133* 128*  K 3.6 3.9  CL 100 95*  CO2 27 28  GLUCOSE 93 103*  BUN 16 16  CREATININE 0.68 0.62  CALCIUM 8.1* 8.0*  MG -- --  PHOS -- --   No results found for this basename: AST:2,ALT:2,ALKPHOS:2,BILITOT:2,PROT:2,ALBUMIN:2 in the last 72 hours No results found for this basename: LIPASE:2,AMYLASE:2 in the last 72 hours  Basename 01/06/11 0615 01/05/11 0500  WBC 12.5* 12.6*  NEUTROABS 9.0* 9.3*  HGB 10.9* 11.7*  HCT 33.1* 35.2*  MCV 89.5 89.3  PLT 412* 468*   No results found for this basename: CKTOTAL:3,CKMB:3,CKMBINDEX:3,TROPONINI:3 in the last 72 hours No results found for this basename: POCBNP:3 in the last 72 hours No results found for this basename: DDIMER:2 in the last 72 hours No results found for this basename: HGBA1C:2 in the last 72 hours No results found for this basename: CHOL:2,HDL:2,LDLCALC:2,TRIG:2,CHOLHDL:2,LDLDIRECT:2 in the last 72 hours No results found for this basename: TSH,T4TOTAL,FREET3,T3FREE,THYROIDAB in the last 72 hours No results found for this basename: VITAMINB12:2,FOLATE:2,FERRITIN:2,TIBC:2,IRON:2,RETICCTPCT:2 in the last 72 hours  Micro Results: Recent Results (from the past 240 hour(s))  URINE CULTURE     Status: Normal   Collection Time   12/27/10  7:34 PM  Component Value Range Status Comment   Specimen Description URINE, RANDOM   Final    Special Requests NONE   Final    Setup Time [id number]   Final    Colony Count >=100,000 COLONIES/ML   Final    Culture     Final    Value: Multiple bacterial morphotypes present, none predominant. Suggest appropriate recollection if clinically indicated.   Report Status 12/29/2010 FINAL   Final   PATHOLOGIST SMEAR REVIEW     Status: Normal   Collection Time    12/30/10  9:04 AM      Component Value Range Status Comment   Tech Review MARKED ABSOLUTE NEUTROPENIA WITHOUT   Final   CULTURE, SPUTUM-ASSESSMENT     Status: Normal   Collection Time   12/31/10  1:31 PM      Component Value Range Status Comment   Specimen Description SPUTUM   Final    Special Requests NONE   Final    Sputum evaluation     Final    Value: MICROSCOPIC FINDINGS SUGGEST THAT THIS SPECIMEN IS NOT REPRESENTATIVE OF LOWER RESPIRATORY SECRETIONS. PLEASE RECOLLECT.     CALLED TO A AFATFAWO, RN 12/31/10 1413 BY K SCHULTZ   Report Status 12/31/2010 FINAL   Final     Studies/Results: Dg Chest 2 View  12/29/2010  *RADIOLOGY REPORT*  Clinical Data: Cough and congestion  CHEST - 2 VIEW  Comparison: 12/24/2010  Findings: Heart size appears normal.  No pleural effusion or pulmonary edema.  The lung volumes are decreased.  There is persistent bilateral perihilar opacities which may be related to infiltrates and/or atelectasis.  Review of the visualized osseous structures is unremarkable.  IMPRESSION:  1.  No significant change in bilateral perihilar opacities.  Original Report Authenticated By: Rosealee Albee, M.D.   Dg Chest 2 View  12/25/2010  *RADIOLOGY REPORT*  Clinical Data: 76 year old female with cough, shortness of breath, pain, bright red blood per rectum.  CHEST - 2 VIEW  Comparison: 09/28/2010 and earlier.  Findings: Stable cardiomegaly and mediastinal contours.  Hilar prominence is stable and probably relates to central vascular enlargement.  The lungs are clear. No acute osseous abnormality identified.  IMPRESSION: Stable cardiomegaly.  No acute cardiopulmonary abnormality.  Original Report Authenticated By: Harley Hallmark, M.D.   Ct Chest W Contrast  01/04/2011  *RADIOLOGY REPORT*  Clinical Data: Left sided pleuritic chest pain.  CT CHEST WITH CONTRAST  Technique:  Multidetector CT imaging of the chest was performed following the standard protocol during bolus administration  of intravenous contrast.  Contrast:  Chest radiograph 01/03/2011.  Comparison: 100 ml Omnipaque-300.  Findings: No pathologically enlarged mediastinal, hilar or axillary lymph nodes.  Heart is mildly enlarged.  No pericardial effusion. Tiny hiatal hernia.  Areas of central bronchiectasis and volume loss are seen bilaterally.  Minimal septal thickening at the lung bases, right greater than left, unchanged from prior exams.  No pleural fluid. Airway is unremarkable.  Incidental imaging of the upper abdomen shows a 2.9 cm low attenuation lesion in the left kidney, which is incompletely imaged.  No worrisome lytic or sclerotic lesions.  IMPRESSION:  No findings to explain the patient's given symptoms.  Original Report Authenticated By: Reyes Ivan, M.D.   Ct Abdomen Pelvis W Contrast  12/27/2010  *RADIOLOGY REPORT*  Clinical Data: Lower abdominal pain, constipation, history of diverticulosis, bladder prolapse, and prior hysterectomy  CT ABDOMEN AND PELVIS WITH CONTRAST  Technique:  Multidetector CT imaging of the abdomen  and pelvis was performed following the standard protocol during bolus administration of intravenous contrast.  Contrast: OMNIPAQUE IOHEXOL 300 MG/ML IV SOLN  Comparison: 09/30/2010  Findings: Motion degraded images.  Mild patchy opacity / scarring at the lung bases.  Liver, spleen, pancreas, and adrenal jets are within normal limits.  Gallbladder is unremarkable.  No intrahepatic or extrahepatic ductal dilatation.  Bilateral renal cysts.  No hydronephrosis.  No evidence of bowel obstruction.  Colonic diverticulosis, without associated inflammatory changes.  Atherosclerotic calcifications of the abdominal aorta and branch vessels.  No abdominopelvic ascites.  No suspicious abdominopelvic lymphadenopathy.  Status post hysterectomy.  Ovaries are unremarkable.  Bladder is within normal limits.  Tiny fat containing inguinal hernias.  Degenerative changes of the visualized thoracolumbar spine.   IMPRESSION: Motion degraded images.  No evidence of bowel obstruction.  Colonic diverticulosis, without associated inflammatory changes.  No CT findings to account for the patient's abdominal pain.  Original Report Authenticated By: Charline Bills, M.D.   Dg Chest Port 1 View  01/03/2011  *RADIOLOGY REPORT*  Clinical Data: Cough  PORTABLE CHEST - 1 VIEW  Comparison: 01/01/2011  Findings: Right arm PICC extends to the proximal SVC. Improved aeration, with residual linear scarring or atelectasis in the mid and lower left lung.  Right lung clear.  No effusion.  Stable mild cardiomegaly.  IMPRESSION: Improved aeration with minimal residual left perihilar scarring or atelectasis.  Original Report Authenticated By: Osa Craver, M.D.   Chest Portable 1 View Post Insertion To Confirm Placement As Interpreted By Radiologist  01/01/2011  *RADIOLOGY REPORT*  Clinical Data: PICC line placement.  PORTABLE CHEST - 1 VIEW  Comparison: 12/31/2010  Findings: Right-sided PICC line noted the tip projecting over the SVC.  No pneumothorax.  Mildly improved perihilar opacities noted.  Mild cardiomegaly is present with tortuosity of the thoracic aorta.  There is linear subsegmental atelectasis at both lung bases.  IMPRESSION:  1.  Right PICC line tip:  SVC.  No pneumothorax. 2.  Reduced perihilar airspace opacity. 3.  Mild bibasilar subsegmental atelectasis. 4.  Mild cardiomegaly.  Original Report Authenticated By: Dellia Cloud, M.D.   Dg Chest Port 1 View  12/31/2010  *RADIOLOGY REPORT*  Clinical Data: Cough.  Fever.  Neutropenia.  PORTABLE CHEST - 1 VIEW  Comparison: 12/29/2010  Findings: Interval worsening of bilateral perihilar air space disease is seen compared to prior exam.  Peripheral atelectasis or scarring in the midlung zones is unchanged.  Cardiomegaly is stable.  No pleural effusions identified.  IMPRESSION: Interval worsening of bilateral perihilar air space disease.  Original Report  Authenticated By: Danae Orleans, M.D.   Dg Abd 2 Views  12/25/2010  *RADIOLOGY REPORT*  Clinical Data: 76 year old female with abdominal pain.  Bright red blood from rectum.  ABDOMEN - 2 VIEW  Comparison: CT abdomen pelvis 09/30/2010 and earlier.  Findings: Supine and left-side down lateral decubitus views.  No pneumoperitoneum. Nonobstructed bowel gas pattern.  Lung bases appear normal. No acute osseous abnormality identified.  Chronic pelvic phleboliths.  IMPRESSION: Nonobstructed bowel gas pattern, no free air.  Original Report Authenticated By: Harley Hallmark, M.D.    Medications: I have reviewed the patient's current medications. Scheduled Meds:   . amLODipine  10 mg Oral Daily  . carvedilol  6.25 mg Oral BID WC  . colchicine  0.6 mg Oral BID  . docusate sodium  100 mg Oral BID  . feeding supplement  1 Container Oral TID BM  . methylPREDNISolone (SOLU-MEDROL)  injection  40 mg Intravenous Q24H  . polyethylene glycol  17 g Oral Daily  . potassium chloride SA  20 mEq Oral Daily  . sodium chloride  10 mL Intracatheter Q12H  . triamcinolone cream   Topical TID   Continuous Infusions:   . sodium chloride 75 mL/hr at 01/05/11 0940   PRN Meds:.acetaminophen, acetaminophen, HYDROcodone-acetaminophen, magnesium hydroxide, ondansetron (ZOFRAN) IV, sodium chloride, sodium chloride, traMADol Assessment: Patient Active Hospital Problem List: 1. anemia 2. Bullous dermatological lesions 3. Facial rash 4. Neutropenia/leukopenia 5. Hypertension 6. Hyponatremia  Plan:   1. Will continue supportive care and await the results of the biopsy and a T[place]ck smear. 2. Increase mobility 3. Continue chronic disease care.    LOS: 13 days

## 2011-01-06 NOTE — Progress Notes (Signed)
Subjective:  Feels better today although she feels that her lesions in her mouth are coming back, tender on lower lip. She states that her right arm is not tender where she had punch biopsy. Denies swelling of tongue, lips, throat. No difficulty breathing or eating. afebrile  meds:     . amLODipine  10 mg Oral Daily  . carvedilol  6.25 mg Oral BID WC  . colchicine  0.6 mg Oral BID  . docusate sodium  100 mg Oral BID  . feeding supplement  1 Container Oral TID BM  . methylPREDNISolone (SOLU-MEDROL) injection  40 mg Intravenous Q24H  . polyethylene glycol  17 g Oral Daily  . potassium chloride SA  20 mEq Oral Daily  . sodium chloride  10 mL Intracatheter Q12H  . triamcinolone cream   Topical TID     Objective: Weight change: 0.5 kg (1 lb 1.6 oz)  Intake/Output Summary (Last 24 hours) at 01/06/11 0500 Last data filed at 01/06/11 0400  Gross per 24 hour  Intake   1400 ml  Output   1500 ml  Net   -100 ml   Blood pressure 137/54, pulse 71, temperature 98.1 F (36.7 C), temperature source Oral, resp. rate 20, height 5\' 1"  (1.549 m), weight 97.9 kg (215 lb 13.3 oz), SpO2 97.00%. Temp:  [97.7 F (36.5 C)-98.1 F (36.7 C)] 98.1 F (36.7 C) (11/28 0400) Pulse Rate:  [68-79] 71  (11/28 0400) Resp:  [20-23] 20  (11/28 0400) BP: (105-137)/(51-75) 137/54 mmHg (11/28 0000) SpO2:  [92 %-97 %] 97 % (11/28 0400) Weight:  [97.9 kg (215 lb 13.3 oz)] 215 lb 13.3 oz (97.9 kg) (11/28 0400)  Physical Exam: General: alert oriented pleasant  HEENT: periorbital edema reduced, injected conjunctiva less intense, no icterus, hyperpigmenation around eyes and cheeks, lips no visible ulcers, no desquamation in mouth itself  CCV: RRR no murmurs galllops or rubs  GI: soft, minimally tender in epigasrtrum pos bowel sounds  Pulm: clear to ausculationa bilaterally no wheezes rhonchi  Neuro: nonfocal  Skin: only 1 tense vesicular tense bullae 1 cm on right arm remains all others resolved.   Lab  Results:  Basename 01/05/11 0500 01/04/11 0546  WBC 12.6* 15.3*  HGB 11.7* 11.8*  HCT 35.2* 35.1*  PLT 468* 467*   BMET  Basename 01/05/11 0500 01/04/11 0546  NA 128* 129*  K 3.9 4.1  CL 95* 96  CO2 28 28  GLUCOSE 103* 96  BUN 16 19  CREATININE 0.62 0.67  CALCIUM 8.0* 8.4    varicella IgG + CMV IgM+/IgG+ Micro Results: Recent Results (from the past 240 hour(s))  URINE CULTURE     Status: Normal   Collection Time   12/27/10  7:34 PM      Component Value Range Status Comment   Specimen Description URINE, RANDOM   Final    Special Requests NONE   Final    Setup Time [id number]   Final    Colony Count >=100,000 COLONIES/ML   Final    Culture     Final    Value: Multiple bacterial morphotypes present, none predominant. Suggest appropriate recollection if clinically indicated.   Report Status 12/29/2010 FINAL   Final   PATHOLOGIST SMEAR REVIEW     Status: Normal   Collection Time   12/30/10  9:04 AM      Component Value Range Status Comment   Tech Review MARKED ABSOLUTE NEUTROPENIA WITHOUT   Final   CULTURE, SPUTUM-ASSESSMENT  Status: Normal   Collection Time   12/31/10  1:31 PM      Component Value Range Status Comment   Specimen Description SPUTUM   Final    Special Requests NONE   Final    Sputum evaluation     Final    Value: MICROSCOPIC FINDINGS SUGGEST THAT THIS SPECIMEN IS NOT REPRESENTATIVE OF LOWER RESPIRATORY SECRETIONS. PLEASE RECOLLECT.     CALLED TO A AFATFAWO, RN 12/31/10 1413 BY K SCHULTZ   Report Status 12/31/2010 FINAL   Final     Studies/Results: Dg Chest 2 View  12/29/2010  *RADIOLOGY REPORT*  Clinical Data: Cough and congestion  CHEST - 2 VIEW  Comparison: 12/24/2010  Findings: Heart size appears normal.  No pleural effusion or pulmonary edema.  The lung volumes are decreased.  There is persistent bilateral perihilar opacities which may be related to infiltrates and/or atelectasis.  Review of the visualized osseous structures is unremarkable.   IMPRESSION:  1.  No significant change in bilateral perihilar opacities.  Original Report Authenticated By: Rosealee Albee, M.D.   Dg Chest 2 View  12/25/2010  *RADIOLOGY REPORT*  Clinical Data: 76 year old female with cough, shortness of breath, pain, bright red blood per rectum.  CHEST - 2 VIEW  Comparison: 09/28/2010 and earlier.  Findings: Stable cardiomegaly and mediastinal contours.  Hilar prominence is stable and probably relates to central vascular enlargement.  The lungs are clear. No acute osseous abnormality identified.  IMPRESSION: Stable cardiomegaly.  No acute cardiopulmonary abnormality.  Original Report Authenticated By: Harley Hallmark, M.D.   Ct Abdomen Pelvis W Contrast  12/27/2010  *RADIOLOGY REPORT*  Clinical Data: Lower abdominal pain, constipation, history of diverticulosis, bladder prolapse, and prior hysterectomy  CT ABDOMEN AND PELVIS WITH CONTRAST  Technique:  Multidetector CT imaging of the abdomen and pelvis was performed following the standard protocol during bolus administration of intravenous contrast.  Contrast: OMNIPAQUE IOHEXOL 300 MG/ML IV SOLN  Comparison: 09/30/2010  Findings: Motion degraded images.  Mild patchy opacity / scarring at the lung bases.  Liver, spleen, pancreas, and adrenal jets are within normal limits.  Gallbladder is unremarkable.  No intrahepatic or extrahepatic ductal dilatation.  Bilateral renal cysts.  No hydronephrosis.  No evidence of bowel obstruction.  Colonic diverticulosis, without associated inflammatory changes.  Atherosclerotic calcifications of the abdominal aorta and branch vessels.  No abdominopelvic ascites.  No suspicious abdominopelvic lymphadenopathy.  Status post hysterectomy.  Ovaries are unremarkable.  Bladder is within normal limits.  Tiny fat containing inguinal hernias.  Degenerative changes of the visualized thoracolumbar spine.  IMPRESSION: Motion degraded images.  No evidence of bowel obstruction.  Colonic  diverticulosis, without associated inflammatory changes.  No CT findings to account for the patient's abdominal pain.  Original Report Authenticated By: Charline Bills, M.D.   Dg Chest Port 1 View  12/31/2010  *RADIOLOGY REPORT*  Clinical Data: Cough.  Fever.  Neutropenia.  PORTABLE CHEST - 1 VIEW  Comparison: 12/29/2010  Findings: Interval worsening of bilateral perihilar air space disease is seen compared to prior exam.  Peripheral atelectasis or scarring in the midlung zones is unchanged.  Cardiomegaly is stable.  No pleural effusions identified.  IMPRESSION: Interval worsening of bilateral perihilar air space disease.  Original Report Authenticated By: Danae Orleans, M.D.   Dg Abd 2 Views  12/25/2010  *RADIOLOGY REPORT*  Clinical Data: 76 year old female with abdominal pain.  Bright red blood from rectum.  ABDOMEN - 2 VIEW  Comparison: CT abdomen pelvis 09/30/2010 and  earlier.  Findings: Supine and left-side down lateral decubitus views.  No pneumoperitoneum. Nonobstructed bowel gas pattern.  Lung bases appear normal. No acute osseous abnormality identified.  Chronic pelvic phleboliths.  IMPRESSION: Nonobstructed bowel gas pattern, no free air.  Original Report Authenticated By: Harley Hallmark, M.D.        Assessment/Plan: [name] is a 76 y.o. female  with vesicular bullous rash, periorbital edema, lip swelling and neutropenia without clear cause. Onset appears to be in the hospital though I wonder if this was brewing prior to admission given her blood tinged sputum and her hematochezia. Differential here should include Kathlen Brunswick which has been ruled out vs, drug related bullous pemphigoid vs  autoimmune disease.  Dermatology and Rheumatology following patient. Skin biopsy path pending. It is also possible that infection such as with Mycoplasma or viral infection such as acute CMV could have triggered skin lesions, but facial angioedema is not characteristic of these  presentations.   - i have done beside scraping of vesicles and sent slides for Tzank testing to see if consistent with zoster - await CMV viral load to see if patient had neutropenia related to acute CMV mononucleosis, but would also expect transaminitis which was absent in this case. - this patient had a very unusual presentation of symptoms, since she is improving to steroids, would continue long taper. Await path on skin biopsy to see if helps determine diagnosis but favor drug related & auto-immune process  -- would not start empiric acyclovir since lesions appear improving and will await tzank smear  -- mouth sores-> consider using PRN magic mouthwash (benadryl, dex, lidocaine mixture)  Samantha Munson MD Infectious Diseases [phone] 01/05/2011 6:10pm

## 2011-01-06 NOTE — Progress Notes (Addendum)
INITIAL ADULT NUTRITION ASSESSMENT Date: 01/06/2011   Time: 10:50 AM Reason for Assessment: Dysphagia  ASSESSMENT: Female 76 y.o.  Dx: Hematochezia, vesicular bullous rash, periorbital edema, lip swelling and neutropenia without clear cause, CHF  Hx:  Past Medical History  Diagnosis Date  . HTN (hypertension)   . Diverticulosis   . Hemorrhoid   . Osteoarthritis   . Constipation, chronic   . UTI (lower urinary tract infection)   . H/O: hysterectomy   . Systolic and diastolic CHF, chronic     45%  . Female bladder prolapse     with prolapse uterus, cystocele, s/p TVH/BSO  . Obesity (BMI 30-39.9)     wt. 94.1 kg 12/2010   Related Meds: Miralax, KCL  Ht: 5\' 1"  (154.9 cm)  Wt: 215 lb 13.3 oz (97.9 kg)  Ideal Wt: 47.8 kg  % Ideal Wt: 205  Usual Wt: 201# % Usual Wt: 107  Body mass index is 40.78 kg/(m^2).  Food/Nutrition Related Hx: Tried to follow [facility] diet prior to admit, Daughter cooked  Labs: Results for FLORESTINE, CARMICAL (MRN [id number]) as of 01/06/2011 10:58  Ref. Range 01/06/2011 06:15  Sodium Latest Range: 135-145 mEq/L 133 (L)  Potassium Latest Range: 3.5-5.1 mEq/L 3.6  Chloride Latest Range: 96-112 mEq/L 100  CO2 Latest Range: 19-32 mEq/L 27  BUN Latest Range: 6-23 mg/dL 16  Creat Latest Range: 0.50-1.10 mg/dL 7.84  Calcium Latest Range: 8.4-10.5 mg/dL 8.1 (L)  GFR calc non Af Amer Latest Range: >90 mL/min 84 (L)  GFR calc Af Amer Latest Range: >90 mL/min >90  Glucose Latest Range: 70-99 mg/dL 93    Intake:  Output:   Diet Order: Dysphagia 2 thin liquids  Supplements/Tube Feeding:Ensure Pudding tid  IVF:    sodium chloride Last Rate: 75 mL/hr at 01/05/11 0940    Estimated Nutritional Needs:per day   Kcal: 1400-1500 Protein: 70-80 Fluid: 1.4 L Pt on dysphagia diet secondary to mouth and tongue swelling.  Improved swallowing per pt.  Poor appetite.  "Eat because I need to"  Fair intake per pt.  Eating Ensure Pudding.  Weight increased, question  secondary to fluid.  NUTRITION DIAGNOSIS: -Swallowing difficulty (NI-1.1).  Status: Ongoing  RELATED TO: oral swelling  AS EVIDENCE BY: patient  And SLP report  MONITORING/EVALUATION(Goals): Tolerate diet to meet >60% estimated needs.  EDUCATION NEEDS: -Education not appropriate at this time  Nutrition DX:  Inadequate oral intake r/t dysphagia and poor appetite aeb reported suboptimal intake.  INTERVENTION: Continue current diet.  Per SLP, upgrade per pt request Provide preverences Continue Ensure Pudding Encouraged po  Dietitian [phone]  DOCUMENTATION CODES Per approved criteria  -Morbid Obesity    Derrell Lolling Anastasia Fiedler 01/06/2011, 10:50 AM

## 2011-01-07 LAB — BASIC METABOLIC PANEL
BUN: 17 mg/dL (ref 6–23)
Creatinine, Ser: 0.73 mg/dL (ref 0.50–1.10)
GFR calc Af Amer: >90 mL/min (ref >90)
GFR calc non Af Amer: 82 mL/min — ABNORMAL LOW (ref >90)
Potassium: 3.8 mEq/L (ref 3.5–5.1)

## 2011-01-07 LAB — CBC
Hemoglobin: 10.5 g/dL — ABNORMAL LOW (ref 12.0–15.0)
MCH: 29.3 pg (ref 26.0–34.0)
MCHC: 32.7 g/dL (ref 30.0–36.0)
RDW: 13.7 % (ref 11.5–15.5)

## 2011-01-07 LAB — DIFFERENTIAL
Basophils Relative: 0 % (ref 0–1)
Eosinophils Absolute: 0.3 10*3/uL (ref 0.0–0.7)
Monocytes Absolute: 1 10*3/uL (ref 0.1–1.0)
Monocytes Relative: 7 % (ref 3–12)
Neutrophils Relative %: 72 % (ref 43–77)

## 2011-01-07 LAB — MPO/PR-3 (ANCA) ANTIBODIES

## 2011-01-07 MED ORDER — WHITE PETROLATUM GEL
Status: AC
  Administered 2011-01-07: 23:00:00
  Filled 2011-01-07: qty 5

## 2011-01-07 NOTE — Progress Notes (Signed)
Subjective:  Patient sitting up in a chair. She states that she ate better today than she had in the days previously. The lesions on her mouth appear to be drying out and improving. She states that she has less soreness in amount than previously.    Objective: Filed Vitals:   01/06/11 2100 01/07/11 0600 01/07/11 0900 01/07/11 1432  BP: 121/60 133/64 128/60 134/54  Pulse: 71 76 80 79  Temp: 98 F (36.7 C) 98.4 F (36.9 C) 97.4 F (36.3 C) 97.8 F (36.6 C)  TempSrc: Oral Oral Oral Oral  Resp: 17 17 18 18   Height:      Weight:  98 kg (216 lb 0.8 oz)    SpO2: 99% 94% 96% 98%   Weight change: 0.1 kg (3.5 oz)  Intake/Output Summary (Last 24 hours) at 01/07/11 2015 Last data filed at 01/07/11 1841  Gross per 24 hour  Intake    480 ml  Output   1801 ml  Net  -1321 ml    General: Alert, awake, oriented x3, in no acute distress.  HEENT: [place]/AT PEERL, EOMI Neck: Trachea midline,  no masses, no thyromegal,y no JVD, no carotid bruit OROPHARYNX:  Mucous membranes are dry and cracked.Marland Kitchen  Heart: Regular rate and rhythm, without murmurs, rubs, gallops, PMI non-displaced, no heaves or thrills on palpation.  Lungs: Clear to auscultation, no wheezing or rhonchi noted. No increased vocal fremitus resonant to percussion  Abdomen: Soft, nontender, nondistended, positive bowel sounds, no masses no hepatosplenomegaly noted..  Neuro: No focal neurological deficits noted cranial nerves II through XII grossly intact. DTRs 2+ bilaterally upper and lower extremities. Strength 5 out of 5 in bilateral upper and lower extremities. Musculoskeletal: No warm swelling or erythema around joints, no spinal tenderness noted. Psychiatric: Patient alert and oriented x3, good insight and cognition, good recent to remote recall. Lymph node survey: No cervical axillary or inguinal lymphadenopathy noted. Skin: The patient has what appear to be healing bullous lesions. There is no desquamation of the skin. Her oropharynx  shows some mucus involvement.     Lab Results:  [facility] 01/07/11 0520 01/06/11 0615  NA 130* 133*  K 3.8 3.6  CL 96 100  CO2 27 27  GLUCOSE 90 93  BUN 17 16  CREATININE 0.73 0.68  CALCIUM 8.2* 8.1*  MG -- --  PHOS -- --   No results found for this basename: AST:2,ALT:2,ALKPHOS:2,BILITOT:2,PROT:2,ALBUMIN:2 in the last 72 hours No results found for this basename: LIPASE:2,AMYLASE:2 in the last 72 hours  Basename 01/07/11 0520 01/06/11 0615  WBC 13.9* 12.5*  NEUTROABS 10.1* 9.0*  HGB 10.5* 10.9*  HCT 32.1* 33.1*  MCV 89.7 89.5  PLT 427* 412*   No results found for this basename: CKTOTAL:3,CKMB:3,CKMBINDEX:3,TROPONINI:3 in the last 72 hours No results found for this basename: POCBNP:3 in the last 72 hours No results found for this basename: DDIMER:2 in the last 72 hours No results found for this basename: HGBA1C:2 in the last 72 hours No results found for this basename: CHOL:2,HDL:2,LDLCALC:2,TRIG:2,CHOLHDL:2,LDLDIRECT:2 in the last 72 hours No results found for this basename: TSH,T4TOTAL,FREET3,T3FREE,THYROIDAB in the last 72 hours No results found for this basename: VITAMINB12:2,FOLATE:2,FERRITIN:2,TIBC:2,IRON:2,RETICCTPCT:2 in the last 72 hours  Micro Results: Recent Results (from the past 240 hour(s))  PATHOLOGIST SMEAR REVIEW     Status: Normal   Collection Time   12/30/10  9:04 AM      Component Value Range Status Comment   Tech Review MARKED ABSOLUTE NEUTROPENIA WITHOUT   Final   CULTURE,  SPUTUM-ASSESSMENT     Status: Normal   Collection Time   12/31/10  1:31 PM      Component Value Range Status Comment   Specimen Description SPUTUM   Final    Special Requests NONE   Final    Sputum evaluation     Final    Value: MICROSCOPIC FINDINGS SUGGEST THAT THIS SPECIMEN IS NOT REPRESENTATIVE OF LOWER RESPIRATORY SECRETIONS. PLEASE RECOLLECT.     CALLED TO A AFATFAWO, RN 12/31/10 1413 BY K SCHULTZ   Report Status 12/31/2010 FINAL   Final     Studies/Results: Dg  Chest 2 View  12/29/2010  *RADIOLOGY REPORT*  Clinical Data: Cough and congestion  CHEST - 2 VIEW  Comparison: 12/24/2010  Findings: Heart size appears normal.  No pleural effusion or pulmonary edema.  The lung volumes are decreased.  There is persistent bilateral perihilar opacities which may be related to infiltrates and/or atelectasis.  Review of the visualized osseous structures is unremarkable.  IMPRESSION:  1.  No significant change in bilateral perihilar opacities.  Original Report Authenticated By: Rosealee Albee, M.D.   Dg Chest 2 View  12/25/2010  *RADIOLOGY REPORT*  Clinical Data: 76 year old female with cough, shortness of breath, pain, bright red blood per rectum.  CHEST - 2 VIEW  Comparison: 09/28/2010 and earlier.  Findings: Stable cardiomegaly and mediastinal contours.  Hilar prominence is stable and probably relates to central vascular enlargement.  The lungs are clear. No acute osseous abnormality identified.  IMPRESSION: Stable cardiomegaly.  No acute cardiopulmonary abnormality.  Original Report Authenticated By: Harley Hallmark, M.D.   Ct Chest W Contrast  01/04/2011  *RADIOLOGY REPORT*  Clinical Data: Left sided pleuritic chest pain.  CT CHEST WITH CONTRAST  Technique:  Multidetector CT imaging of the chest was performed following the standard protocol during bolus administration of intravenous contrast.  Contrast:  Chest radiograph 01/03/2011.  Comparison: 100 ml Omnipaque-300.  Findings: No pathologically enlarged mediastinal, hilar or axillary lymph nodes.  Heart is mildly enlarged.  No pericardial effusion. Tiny hiatal hernia.  Areas of central bronchiectasis and volume loss are seen bilaterally.  Minimal septal thickening at the lung bases, right greater than left, unchanged from prior exams.  No pleural fluid. Airway is unremarkable.  Incidental imaging of the upper abdomen shows a 2.9 cm low attenuation lesion in the left kidney, which is incompletely imaged.  No worrisome  lytic or sclerotic lesions.  IMPRESSION:  No findings to explain the patient's given symptoms.  Original Report Authenticated By: Reyes Ivan, M.D.   Ct Abdomen Pelvis W Contrast  12/27/2010  *RADIOLOGY REPORT*  Clinical Data: Lower abdominal pain, constipation, history of diverticulosis, bladder prolapse, and prior hysterectomy  CT ABDOMEN AND PELVIS WITH CONTRAST  Technique:  Multidetector CT imaging of the abdomen and pelvis was performed following the standard protocol during bolus administration of intravenous contrast.  Contrast: OMNIPAQUE IOHEXOL 300 MG/ML IV SOLN  Comparison: 09/30/2010  Findings: Motion degraded images.  Mild patchy opacity / scarring at the lung bases.  Liver, spleen, pancreas, and adrenal jets are within normal limits.  Gallbladder is unremarkable.  No intrahepatic or extrahepatic ductal dilatation.  Bilateral renal cysts.  No hydronephrosis.  No evidence of bowel obstruction.  Colonic diverticulosis, without associated inflammatory changes.  Atherosclerotic calcifications of the abdominal aorta and branch vessels.  No abdominopelvic ascites.  No suspicious abdominopelvic lymphadenopathy.  Status post hysterectomy.  Ovaries are unremarkable.  Bladder is within normal limits.  Tiny fat containing inguinal  hernias.  Degenerative changes of the visualized thoracolumbar spine.  IMPRESSION: Motion degraded images.  No evidence of bowel obstruction.  Colonic diverticulosis, without associated inflammatory changes.  No CT findings to account for the patient's abdominal pain.  Original Report Authenticated By: Charline Bills, M.D.   Dg Chest Port 1 View  01/03/2011  *RADIOLOGY REPORT*  Clinical Data: Cough  PORTABLE CHEST - 1 VIEW  Comparison: 01/01/2011  Findings: Right arm PICC extends to the proximal SVC. Improved aeration, with residual linear scarring or atelectasis in the mid and lower left lung.  Right lung clear.  No effusion.  Stable mild cardiomegaly.  IMPRESSION:  Improved aeration with minimal residual left perihilar scarring or atelectasis.  Original Report Authenticated By: Osa Craver, M.D.   Chest Portable 1 View Post Insertion To Confirm Placement As Interpreted By Radiologist  01/01/2011  *RADIOLOGY REPORT*  Clinical Data: PICC line placement.  PORTABLE CHEST - 1 VIEW  Comparison: 12/31/2010  Findings: Right-sided PICC line noted the tip projecting over the SVC.  No pneumothorax.  Mildly improved perihilar opacities noted.  Mild cardiomegaly is present with tortuosity of the thoracic aorta.  There is linear subsegmental atelectasis at both lung bases.  IMPRESSION:  1.  Right PICC line tip:  SVC.  No pneumothorax. 2.  Reduced perihilar airspace opacity. 3.  Mild bibasilar subsegmental atelectasis. 4.  Mild cardiomegaly.  Original Report Authenticated By: Dellia Cloud, M.D.   Dg Chest Port 1 View  12/31/2010  *RADIOLOGY REPORT*  Clinical Data: Cough.  Fever.  Neutropenia.  PORTABLE CHEST - 1 VIEW  Comparison: 12/29/2010  Findings: Interval worsening of bilateral perihilar air space disease is seen compared to prior exam.  Peripheral atelectasis or scarring in the midlung zones is unchanged.  Cardiomegaly is stable.  No pleural effusions identified.  IMPRESSION: Interval worsening of bilateral perihilar air space disease.  Original Report Authenticated By: Danae [place], M.D.   Dg Abd 2 Views  12/25/2010  *RADIOLOGY REPORT*  Clinical Data: 76 year old female with abdominal pain.  Bright red blood from rectum.  ABDOMEN - 2 VIEW  Comparison: CT abdomen pelvis 09/30/2010 and earlier.  Findings: Supine and left-side down lateral decubitus views.  No pneumoperitoneum. Nonobstructed bowel gas pattern.  Lung bases appear normal. No acute osseous abnormality identified.  Chronic pelvic phleboliths.  IMPRESSION: Nonobstructed bowel gas pattern, no free air.  Original Report Authenticated By: Harley Hallmark, M.D.    Medications: I have reviewed the  patient's current medications. Scheduled Meds:    . amLODipine  10 mg Oral Daily  . carvedilol  6.25 mg Oral BID WC  . colchicine  0.6 mg Oral BID  . docusate sodium  100 mg Oral BID  . feeding supplement  1 Container Oral TID BM  . methylPREDNISolone (SOLU-MEDROL) injection  40 mg Intravenous Q24H  . polyethylene glycol  17 g Oral Daily  . potassium chloride SA  20 mEq Oral Daily  . sodium chloride  10 mL Intracatheter Q12H  . triamcinolone cream   Topical TID   Continuous Infusions:  PRN Meds:.acetaminophen, acetaminophen, HYDROcodone-acetaminophen, magnesium hydroxide, ondansetron (ZOFRAN) IV, sodium chloride, sodium chloride Assessment: Patient Active Hospital Problem List: 1. anemia 2. Bullous dermatological lesions 3. Facial rash 4. Neutropenia/leukopenia 5. Hypertension 6. Hyponatremia  Plan:   1. Will continue supportive care and await the results of the biopsy and a T[place]ck smear. 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 It appears to be improving. Appreciate input  from physical therapy. 2. Increase mobility 3. Continue chronic disease care.    LOS: 14 days

## 2011-01-07 NOTE — Progress Notes (Signed)
Physical Therapy Treatment Patient Details Name: KEYAUNA GRAEFE MRN: [id number] DOB: [birth year] Today's Date: 01/07/2011  PT Assessment/Plan  PT - Assessment/Plan Comments on Treatment Session: Again very self limiting, but she has the ability to progress PT Plan: Discharge plan remains appropriate PT Frequency: Min 3X/week Follow Up Recommendations: Skilled nursing facility Equipment Recommended: Defer to next venue PT Goals  Acute Rehab PT Goals PT Goal Formulation: With patient Time For Goal Achievement: 2 weeks Pt will go Supine/Side to Sit: Independently;with HOB 0 degrees PT Goal: Supine/Side to Sit - Progress: Other (comment) Pt will go Sit to Supine/Side: Independently;with HOB 0 degrees PT Goal: Sit to Supine/Side - Progress: Other (comment) Pt will Transfer Sit to Stand/Stand to Sit: with modified independence;with upper extremity assist PT Transfer Goal: Sit to Stand/Stand to Sit - Progress: Progressing toward goal Pt will Transfer Bed to Chair/Chair to Bed: with modified independence PT Transfer Goal: Bed to Chair/Chair to Bed - Progress: Progressing toward goal Pt will Ambulate: 51 - 150 feet;with modified independence;with rolling walker PT Goal: Ambulate - Progress: Progressing toward goal Pt will Go Up / Down Stairs: 1-2 stairs;with modified independence;with rolling walker PT Goal: Up/Down Stairs - Progress: Other (comment)  PT Treatment Precautions/Restrictions  Precautions Precautions: Fall Restrictions Weight Bearing Restrictions: No Mobility (including Balance) Bed Mobility Bed Mobility: No Transfers Transfers: Yes Sit to Stand: 4: Min assist;From chair/3-in-1 Sit to Stand Details (indicate cue type and reason): min assist for bil LE and trunk extension cues for hand placement Stand to Sit: With upper extremity assist;To chair/3-in-1;5: Supervision Stand to Sit Details: cues for hand placement.  Ambulation/Gait Ambulation/Gait: Yes Ambulation/Gait  Assistance: 4: Min assist;Other (comment) Ambulation/Gait Assistance Details (indicate cue type and reason): min assist for inital balance.   Ambulation Distance (Feet): 12 Feet Assistive device: Rolling walker Gait Pattern: Shuffle;Step-through pattern;Right foot flat Stairs: No Wheelchair Mobility Wheelchair Mobility: No  Posture/Postural Control Posture/Postural Control: No significant limitations Balance Balance Assessed: No Exercise  General Exercises - Lower Extremity Long Arc Quad: 10 reps;Both;Seated;Strengthening;AROM End of Session PT - End of Session Equipment Utilized During Treatment: Gait belt Activity Tolerance: Patient limited by fatigue;Other (comment);Patient limited by pain Patient left: in chair Nurse Communication: Mobility status for ambulation General Behavior During Session: [facility] for tasks performed Cognition: [facility] for tasks performed  Calyx Hawker L. Cashius Grandstaff DPT [phone] Alferd Apa 01/07/2011, 6:17 PM

## 2011-01-08 LAB — BASIC METABOLIC PANEL
BUN: 21 mg/dL (ref 6–23)
Calcium: 8.3 mg/dL — ABNORMAL LOW (ref 8.4–10.5)
Calcium: 8.2 mg/dL — ABNORMAL LOW (ref 8.4–10.5)
Creatinine, Ser: 0.66 mg/dL (ref 0.50–1.10)
GFR calc Af Amer: >90 mL/min (ref >90)
GFR calc non Af Amer: 68 mL/min — ABNORMAL LOW (ref >90)
Glucose, Bld: 97 mg/dL (ref 70–99)
Potassium: 4.1 mEq/L (ref 3.5–5.1)

## 2011-01-08 LAB — DIFFERENTIAL
Basophils Absolute: 0 10*3/uL (ref 0.0–0.1)
Basophils Relative: 0 % (ref 0–1)
Basophils Relative: 0 % (ref 0–1)
Eosinophils Absolute: 0.1 10*3/uL (ref 0.0–0.7)
Eosinophils Relative: 2 % (ref 0–5)
Eosinophils Relative: 0 % (ref 0–5)
Monocytes Absolute: 1.2 10*3/uL — ABNORMAL HIGH (ref 0.1–1.0)
Monocytes Relative: 6 % (ref 3–12)
Neutrophils Relative %: 83 % — ABNORMAL HIGH (ref 43–77)

## 2011-01-08 LAB — CBC
HCT: 30.6 % — ABNORMAL LOW (ref 36.0–46.0)
Hemoglobin: 10.3 g/dL — ABNORMAL LOW (ref 12.0–15.0)
MCH: 29.7 pg (ref 26.0–34.0)
MCHC: 33.3 g/dL (ref 30.0–36.0)
MCHC: 33.1 g/dL (ref 30.0–36.0)
MCV: 89.7 fL (ref 78.0–100.0)
MCV: 89.6 fL (ref 78.0–100.0)
RDW: 14 % (ref 11.5–15.5)

## 2011-01-08 LAB — CMV (CYTOMEGALOVIRUS) DNA ULTRAQUANT, PCR

## 2011-01-08 LAB — GLUCOSE, CAPILLARY: Glucose-Capillary: 107 mg/dL — ABNORMAL HIGH (ref 70–99)

## 2011-01-08 NOTE — Progress Notes (Signed)
Clinical social worker updated skilled nursing facility search with updated clinicals. CSW to follow up with bed offers over the weekend due to pt recommending pt for snf now not home health.   Catha Gosselin, Theresia Majors  [phone] .01/08/2011  17:25

## 2011-01-08 NOTE — Progress Notes (Signed)
Subjective: Biopsy results back which shows only inflammatory changes. I informed patient of the biopsy results Patient sitting up in a chair. She states that she ate better today than she had in the days previously. The lesions on her mouth appear to be drying out and improving. She states that she has less soreness in amount than previously. Physical therapy has recommended short-term inpatient rehabilitation and the patient is receptive to this.   Objective: Filed Vitals:   01/07/11 1432 01/07/11 2141 01/08/11 0710 01/08/11 1438  BP: 134/54 130/69 130/52 131/53  Pulse: 79 80 76 70  Temp: 97.8 F (36.6 C) 98.3 F (36.8 C) 98.5 F (36.9 C) 98.8 F (37.1 C)  TempSrc: Oral Oral Oral   Resp: 18 18 18 20   Height:      Weight:      SpO2: 98% 98% 98% 94%   Weight change:   Intake/Output Summary (Last 24 hours) at 01/08/11 1632 Last data filed at 01/08/11 0900  Gross per 24 hour  Intake    240 ml  Output   2502 ml  Net  -2262 ml    General: Alert, awake, oriented x3, in no acute distress.  HEENT: /AT PEERL, EOMI Neck: Trachea midline,  no masses, no thyromegal,y no JVD, no carotid bruit OROPHARYNX:  Mucous membranes are dry and cracked.Marland Kitchen  Heart: Regular rate and rhythm, without murmurs, rubs, gallops, PMI non-displaced, no heaves or thrills on palpation.  Lungs: Clear to auscultation, no wheezing or rhonchi noted. No increased vocal fremitus resonant to percussion  Abdomen: Soft, nontender, nondistended, positive bowel sounds, no masses no hepatosplenomegaly noted..  Neuro: No focal neurological deficits noted cranial nerves II through XII grossly intact. DTRs 2+ bilaterally upper and lower extremities. Strength 5 out of 5 in bilateral upper and lower extremities. Musculoskeletal: No warm swelling or erythema around joints, no spinal tenderness noted. Psychiatric: Patient alert and oriented x3, good insight and cognition, good recent to remote recall. Lymph node survey: No  cervical axillary or inguinal lymphadenopathy noted. Skin: The skin lesions are essentially healed. There no new lesions. There is no evidence of infection around the site of biopsy of the right arm.    Lab Results:  [facility] 01/08/11 0527 01/07/11 0520  NA 136 130*  K 3.8 3.8  CL 100 96  CO2 28 27  GLUCOSE 102* 90  BUN 17 17  CREATININE 0.66 0.73  CALCIUM 8.3* 8.2*  MG -- --  PHOS -- --   No results found for this basename: AST:2,ALT:2,ALKPHOS:2,BILITOT:2,PROT:2,ALBUMIN:2 in the last 72 hours No results found for this basename: LIPASE:2,AMYLASE:2 in the last 72 hours  Basename 01/08/11 0527 01/07/11 0520  WBC 15.6* 13.9*  NEUTROABS 11.2* 10.1*  HGB 10.2* 10.5*  HCT 30.6* 32.1*  MCV 89.7 89.7  PLT 460* 427*   No results found for this basename: CKTOTAL:3,CKMB:3,CKMBINDEX:3,TROPONINI:3 in the last 72 hours No results found for this basename: POCBNP:3 in the last 72 hours No results found for this basename: DDIMER:2 in the last 72 hours No results found for this basename: HGBA1C:2 in the last 72 hours No results found for this basename: CHOL:2,HDL:2,LDLCALC:2,TRIG:2,CHOLHDL:2,LDLDIRECT:2 in the last 72 hours No results found for this basename: TSH,T4TOTAL,FREET3,T3FREE,THYROIDAB in the last 72 hours No results found for this basename: VITAMINB12:2,FOLATE:2,FERRITIN:2,TIBC:2,IRON:2,RETICCTPCT:2 in the last 72 hours  Micro Results: Recent Results (from the past 240 hour(s))  PATHOLOGIST SMEAR REVIEW     Status: Normal   Collection Time   12/30/10  9:04 AM  Component Value Range Status Comment   Tech Review MARKED ABSOLUTE NEUTROPENIA WITHOUT   Final   CULTURE, SPUTUM-ASSESSMENT     Status: Normal   Collection Time   12/31/10  1:31 PM      Component Value Range Status Comment   Specimen Description SPUTUM   Final    Special Requests NONE   Final    Sputum evaluation     Final    Value: MICROSCOPIC FINDINGS SUGGEST THAT THIS SPECIMEN IS NOT REPRESENTATIVE OF LOWER  RESPIRATORY SECRETIONS. PLEASE RECOLLECT.     CALLED TO A AFATFAWO, RN 12/31/10 1413 BY K SCHULTZ   Report Status 12/31/2010 FINAL   Final     Studies/Results: Dg Chest 2 View  12/29/2010  *RADIOLOGY REPORT*  Clinical Data: Cough and congestion  CHEST - 2 VIEW  Comparison: 12/24/2010  Findings: Heart size appears normal.  No pleural effusion or pulmonary edema.  The lung volumes are decreased.  There is persistent bilateral perihilar opacities which may be related to infiltrates and/or atelectasis.  Review of the visualized osseous structures is unremarkable.  IMPRESSION:  1.  No significant change in bilateral perihilar opacities.  Original Report Authenticated By: Rosealee Albee, M.D.   Dg Chest 2 View  12/25/2010  *RADIOLOGY REPORT*  Clinical Data: 76 year old female with cough, shortness of breath, pain, bright red blood per rectum.  CHEST - 2 VIEW  Comparison: 09/28/2010 and earlier.  Findings: Stable cardiomegaly and mediastinal contours.  Hilar prominence is stable and probably relates to central vascular enlargement.  The lungs are clear. No acute osseous abnormality identified.  IMPRESSION: Stable cardiomegaly.  No acute cardiopulmonary abnormality.  Original Report Authenticated By: Harley Hallmark, M.D.   Ct Chest W Contrast  01/04/2011  *RADIOLOGY REPORT*  Clinical Data: Left sided pleuritic chest pain.  CT CHEST WITH CONTRAST  Technique:  Multidetector CT imaging of the chest was performed following the standard protocol during bolus administration of intravenous contrast.  Contrast:  Chest radiograph 01/03/2011.  Comparison: 100 ml Omnipaque-300.  Findings: No pathologically enlarged mediastinal, hilar or axillary lymph nodes.  Heart is mildly enlarged.  No pericardial effusion. Tiny hiatal hernia.  Areas of central bronchiectasis and volume loss are seen bilaterally.  Minimal septal thickening at the lung bases, right greater than left, unchanged from prior exams.  No pleural fluid.  Airway is unremarkable.  Incidental imaging of the upper abdomen shows a 2.9 cm low attenuation lesion in the left kidney, which is incompletely imaged.  No worrisome lytic or sclerotic lesions.  IMPRESSION:  No findings to explain the patient's given symptoms.  Original Report Authenticated By: Reyes Ivan, M.D.   Ct Abdomen Pelvis W Contrast  12/27/2010  *RADIOLOGY REPORT*  Clinical Data: Lower abdominal pain, constipation, history of diverticulosis, bladder prolapse, and prior hysterectomy  CT ABDOMEN AND PELVIS WITH CONTRAST  Technique:  Multidetector CT imaging of the abdomen and pelvis was performed following the standard protocol during bolus administration of intravenous contrast.  Contrast: OMNIPAQUE IOHEXOL 300 MG/ML IV SOLN  Comparison: 09/30/2010  Findings: Motion degraded images.  Mild patchy opacity / scarring at the lung bases.  Liver, spleen, pancreas, and adrenal jets are within normal limits.  Gallbladder is unremarkable.  No intrahepatic or extrahepatic ductal dilatation.  Bilateral renal cysts.  No hydronephrosis.  No evidence of bowel obstruction.  Colonic diverticulosis, without associated inflammatory changes.  Atherosclerotic calcifications of the abdominal aorta and branch vessels.  No abdominopelvic ascites.  No suspicious abdominopelvic lymphadenopathy.  Status post hysterectomy.  Ovaries are unremarkable.  Bladder is within normal limits.  Tiny fat containing inguinal hernias.  Degenerative changes of the visualized thoracolumbar spine.  IMPRESSION: Motion degraded images.  No evidence of bowel obstruction.  Colonic diverticulosis, without associated inflammatory changes.  No CT findings to account for the patient's abdominal pain.  Original Report Authenticated By: Charline Bills, M.D.   Dg Chest Port 1 View  01/03/2011  *RADIOLOGY REPORT*  Clinical Data: Cough  PORTABLE CHEST - 1 VIEW  Comparison: 01/01/2011  Findings: Right arm PICC extends to the proximal SVC.  Improved aeration, with residual linear scarring or atelectasis in the mid and lower left lung.  Right lung clear.  No effusion.  Stable mild cardiomegaly.  IMPRESSION: Improved aeration with minimal residual left perihilar scarring or atelectasis.  Original Report Authenticated By: Osa Craver, M.D.   Chest Portable 1 View Post Insertion To Confirm Placement As Interpreted By Radiologist  01/01/2011  *RADIOLOGY REPORT*  Clinical Data: PICC line placement.  PORTABLE CHEST - 1 VIEW  Comparison: 12/31/2010  Findings: Right-sided PICC line noted the tip projecting over the SVC.  No pneumothorax.  Mildly improved perihilar opacities noted.  Mild cardiomegaly is present with tortuosity of the thoracic aorta.  There is linear subsegmental atelectasis at both lung bases.  IMPRESSION:  1.  Right PICC line tip:  SVC.  No pneumothorax. 2.  Reduced perihilar airspace opacity. 3.  Mild bibasilar subsegmental atelectasis. 4.  Mild cardiomegaly.  Original Report Authenticated By: Dellia Cloud, M.D.   Dg Chest Port 1 View  12/31/2010  *RADIOLOGY REPORT*  Clinical Data: Cough.  Fever.  Neutropenia.  PORTABLE CHEST - 1 VIEW  Comparison: 12/29/2010  Findings: Interval worsening of bilateral perihilar air space disease is seen compared to prior exam.  Peripheral atelectasis or scarring in the midlung zones is unchanged.  Cardiomegaly is stable.  No pleural effusions identified.  IMPRESSION: Interval worsening of bilateral perihilar air space disease.  Original Report Authenticated By: Danae Orleans, M.D.   Dg Abd 2 Views  12/25/2010  *RADIOLOGY REPORT*  Clinical Data: 76 year old female with abdominal pain.  Bright red blood from rectum.  ABDOMEN - 2 VIEW  Comparison: CT abdomen pelvis 09/30/2010 and earlier.  Findings: Supine and left-side down lateral decubitus views.  No pneumoperitoneum. Nonobstructed bowel gas pattern.  Lung bases appear normal. No acute osseous abnormality identified.  Chronic  pelvic phleboliths.  IMPRESSION: Nonobstructed bowel gas pattern, no free air.  Original Report Authenticated By: Harley Hallmark, M.D.    Medications: I have reviewed the patient's current medications. Scheduled Meds:    . amLODipine  10 mg Oral Daily  . carvedilol  6.25 mg Oral BID WC  . colchicine  0.6 mg Oral BID  . docusate sodium  100 mg Oral BID  . feeding supplement  1 Container Oral TID BM  . methylPREDNISolone (SOLU-MEDROL) injection  40 mg Intravenous Q24H  . polyethylene glycol  17 g Oral Daily  . potassium chloride SA  20 mEq Oral Daily  . sodium chloride  10 mL Intracatheter Q12H  . triamcinolone cream   Topical TID  . white petrolatum       Continuous Infusions:  PRN Meds:.acetaminophen, acetaminophen, HYDROcodone-acetaminophen, magnesium hydroxide, ondansetron (ZOFRAN) IV, sodium chloride, sodium chloride Assessment: Patient Active Hospital Problem List: 1. anemia 2. Bullous dermatological lesions 3. Facial rash 4. Neutropenia/leukopenia 5. Hypertension 6. Hyponatremia  Plan:   1. Will continue supportive care and await the results  of the biopsy and a Tzanck smear. 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 It appears to be improving. Appreciate input from physical therapy. 2. Increase mobility. Patient for inpatient rehabilitation  3. Continue chronic disease care.    LOS: 15 days

## 2011-01-09 LAB — GLUCOSE, CAPILLARY

## 2011-01-09 MED ORDER — BISACODYL 10 MG RE SUPP
10.0000 mg | Freq: Once | RECTAL | Status: DC
  Filled 2011-01-09: qty 1

## 2011-01-09 NOTE — Progress Notes (Signed)
CSW provided bed offers.  Pt not feeling well/having pain and agreed to f/u with CSW when choice is made. Milus Banister MSW,LCSW w/e Coverage [phone]

## 2011-01-09 NOTE — Progress Notes (Signed)
Subjective: Patient without any complaints today except that she feels constipated. Her lesions continue to get better and she denies any soreness of the mouth.  Objective: Filed Vitals:   01/08/11 2200 01/09/11 0553 01/09/11 0604 01/09/11 1400  BP: 122/58 138/65  113/70  Pulse: 84 69  70  Temp: 98.2 F (36.8 C) 98.6 F (37 C)  97.6 F (36.4 C)  TempSrc: Oral Oral  Oral  Resp: 18 18  20   Height:      Weight:   101.5 kg (223 lb 12.3 oz)   SpO2: 98% 98%  96%   Weight change:   Intake/Output Summary (Last 24 hours) at 01/09/11 1901 Last data filed at 01/09/11 1400  Gross per 24 hour  Intake    480 ml  Output   2850 ml  Net  -2370 ml    General: Alert, awake, oriented x3, in no acute distress.  HEENT: [place]/AT PEERL, EOMI Neck: Trachea midline,  no masses, no thyromegal,y no JVD, no carotid bruit OROPHARYNX:  Mucous membranes are dry and cracked.Marland Kitchen  Heart: Regular rate and rhythm, without murmurs, rubs, gallops, PMI non-displaced, no heaves or thrills on palpation.  Lungs: Clear to auscultation, no wheezing or rhonchi noted. No increased vocal fremitus resonant to percussion  Abdomen: Soft, nontender, nondistended, positive bowel sounds, no masses no hepatosplenomegaly noted..  Neuro: No focal neurological deficits noted cranial nerves II through XII grossly intact. DTRs 2+ bilaterally upper and lower extremities. Strength 5 out of 5 in bilateral upper and lower extremities. Musculoskeletal: No warm swelling or erythema around joints, no spinal tenderness noted. Psychiatric: Patient alert and oriented x3, good insight and cognition, good recent to remote recall. Lymph node survey: No cervical axillary or inguinal lymphadenopathy noted. Skin: The skin lesions are essentially healed. There no new lesions. There is no evidence of infection around the site of biopsy of the right arm.    Lab Results:  [facility] 01/08/11 2012 01/08/11 0527  NA 129* 136  K 4.1 3.8  CL 97 100  CO2 27  28  GLUCOSE 97 102*  BUN 21 17  CREATININE 0.83 0.66  CALCIUM 8.2* 8.3*  MG -- --  PHOS -- --   No results found for this basename: AST:2,ALT:2,ALKPHOS:2,BILITOT:2,PROT:2,ALBUMIN:2 in the last 72 hours No results found for this basename: LIPASE:2,AMYLASE:2 in the last 72 hours  Basename 01/08/11 2012 01/08/11 0527  WBC 20.4* 15.6*  NEUTROABS 17.0* 11.2*  HGB 10.3* 10.2*  HCT 31.1* 30.6*  MCV 89.6 89.7  PLT 468* 460*   No results found for this basename: CKTOTAL:3,CKMB:3,CKMBINDEX:3,TROPONINI:3 in the last 72 hours No results found for this basename: POCBNP:3 in the last 72 hours No results found for this basename: DDIMER:2 in the last 72 hours No results found for this basename: HGBA1C:2 in the last 72 hours No results found for this basename: CHOL:2,HDL:2,LDLCALC:2,TRIG:2,CHOLHDL:2,LDLDIRECT:2 in the last 72 hours No results found for this basename: TSH,T4TOTAL,FREET3,T3FREE,THYROIDAB in the last 72 hours No results found for this basename: VITAMINB12:2,FOLATE:2,FERRITIN:2,TIBC:2,IRON:2,RETICCTPCT:2 in the last 72 hours  Micro Results: Recent Results (from the past 240 hour(s))  CULTURE, SPUTUM-ASSESSMENT     Status: Normal   Collection Time   12/31/10  1:31 PM      Component Value Range Status Comment   Specimen Description SPUTUM   Final    Special Requests NONE   Final    Sputum evaluation     Final    Value: MICROSCOPIC FINDINGS SUGGEST THAT THIS SPECIMEN IS NOT REPRESENTATIVE OF LOWER  RESPIRATORY SECRETIONS. PLEASE RECOLLECT.     CALLED TO A AFATFAWO, RN 12/31/10 1413 BY K SCHULTZ   Report Status 12/31/2010 FINAL   Final     Studies/Results: Dg Chest 2 View  12/29/2010  *RADIOLOGY REPORT*  Clinical Data: Cough and congestion  CHEST - 2 VIEW  Comparison: 12/24/2010  Findings: Heart size appears normal.  No pleural effusion or pulmonary edema.  The lung volumes are decreased.  There is persistent bilateral perihilar opacities which may be related to infiltrates and/or  atelectasis.  Review of the visualized osseous structures is unremarkable.  IMPRESSION:  1.  No significant change in bilateral perihilar opacities.  Original Report Authenticated By: Rosealee Albee, M.D.   Dg Chest 2 View  12/25/2010  *RADIOLOGY REPORT*  Clinical Data: 76 year old female with cough, shortness of breath, pain, bright red blood per rectum.  CHEST - 2 VIEW  Comparison: 09/28/2010 and earlier.  Findings: Stable cardiomegaly and mediastinal contours.  Hilar prominence is stable and probably relates to central vascular enlargement.  The lungs are clear. No acute osseous abnormality identified.  IMPRESSION: Stable cardiomegaly.  No acute cardiopulmonary abnormality.  Original Report Authenticated By: Harley Hallmark, M.D.   Ct Chest W Contrast  01/04/2011  *RADIOLOGY REPORT*  Clinical Data: Left sided pleuritic chest pain.  CT CHEST WITH CONTRAST  Technique:  Multidetector CT imaging of the chest was performed following the standard protocol during bolus administration of intravenous contrast.  Contrast:  Chest radiograph 01/03/2011.  Comparison: 100 ml Omnipaque-300.  Findings: No pathologically enlarged mediastinal, hilar or axillary lymph nodes.  Heart is mildly enlarged.  No pericardial effusion. Tiny hiatal hernia.  Areas of central bronchiectasis and volume loss are seen bilaterally.  Minimal septal thickening at the lung bases, right greater than left, unchanged from prior exams.  No pleural fluid. Airway is unremarkable.  Incidental imaging of the upper abdomen shows a 2.9 cm low attenuation lesion in the left kidney, which is incompletely imaged.  No worrisome lytic or sclerotic lesions.  IMPRESSION:  No findings to explain the patient's given symptoms.  Original Report Authenticated By: Reyes Ivan, M.D.   Ct Abdomen Pelvis W Contrast  12/27/2010  *RADIOLOGY REPORT*  Clinical Data: Lower abdominal pain, constipation, history of diverticulosis, bladder prolapse, and prior  hysterectomy  CT ABDOMEN AND PELVIS WITH CONTRAST  Technique:  Multidetector CT imaging of the abdomen and pelvis was performed following the standard protocol during bolus administration of intravenous contrast.  Contrast: OMNIPAQUE IOHEXOL 300 MG/ML IV SOLN  Comparison: 09/30/2010  Findings: Motion degraded images.  Mild patchy opacity / scarring at the lung bases.  Liver, spleen, pancreas, and adrenal jets are within normal limits.  Gallbladder is unremarkable.  No intrahepatic or extrahepatic ductal dilatation.  Bilateral renal cysts.  No hydronephrosis.  No evidence of bowel obstruction.  Colonic diverticulosis, without associated inflammatory changes.  Atherosclerotic calcifications of the abdominal aorta and branch vessels.  No abdominopelvic ascites.  No suspicious abdominopelvic lymphadenopathy.  Status post hysterectomy.  Ovaries are unremarkable.  Bladder is within normal limits.  Tiny fat containing inguinal hernias.  Degenerative changes of the visualized thoracolumbar spine.  IMPRESSION: Motion degraded images.  No evidence of bowel obstruction.  Colonic diverticulosis, without associated inflammatory changes.  No CT findings to account for the patient's abdominal pain.  Original Report Authenticated By: Charline Bills, M.D.   Dg Chest Port 1 View  01/03/2011  *RADIOLOGY REPORT*  Clinical Data: Cough  PORTABLE CHEST - 1 VIEW  Comparison: 01/01/2011  Findings: Right arm PICC extends to the proximal SVC. Improved aeration, with residual linear scarring or atelectasis in the mid and lower left lung.  Right lung clear.  No effusion.  Stable mild cardiomegaly.  IMPRESSION: Improved aeration with minimal residual left perihilar scarring or atelectasis.  Original Report Authenticated By: Osa Craver, M.D.   Chest Portable 1 View Post Insertion To Confirm Placement As Interpreted By Radiologist  01/01/2011  *RADIOLOGY REPORT*  Clinical Data: PICC line placement.  PORTABLE CHEST - 1  VIEW  Comparison: 12/31/2010  Findings: Right-sided PICC line noted the tip projecting over the SVC.  No pneumothorax.  Mildly improved perihilar opacities noted.  Mild cardiomegaly is present with tortuosity of the thoracic aorta.  There is linear subsegmental atelectasis at both lung bases.  IMPRESSION:  1.  Right PICC line tip:  SVC.  No pneumothorax. 2.  Reduced perihilar airspace opacity. 3.  Mild bibasilar subsegmental atelectasis. 4.  Mild cardiomegaly.  Original Report Authenticated By: Dellia Cloud, M.D.   Dg Chest Port 1 View  12/31/2010  *RADIOLOGY REPORT*  Clinical Data: Cough.  Fever.  Neutropenia.  PORTABLE CHEST - 1 VIEW  Comparison: 12/29/2010  Findings: Interval worsening of bilateral perihilar air space disease is seen compared to prior exam.  Peripheral atelectasis or scarring in the midlung zones is unchanged.  Cardiomegaly is stable.  No pleural effusions identified.  IMPRESSION: Interval worsening of bilateral perihilar air space disease.  Original Report Authenticated By: Danae Orleans, M.D.   Dg Abd 2 Views  12/25/2010  *RADIOLOGY REPORT*  Clinical Data: 76 year old female with abdominal pain.  Bright red blood from rectum.  ABDOMEN - 2 VIEW  Comparison: CT abdomen pelvis 09/30/2010 and earlier.  Findings: Supine and left-side down lateral decubitus views.  No pneumoperitoneum. Nonobstructed bowel gas pattern.  Lung bases appear normal. No acute osseous abnormality identified.  Chronic pelvic phleboliths.  IMPRESSION: Nonobstructed bowel gas pattern, no free air.  Original Report Authenticated By: Harley Hallmark, M.D.    Medications: I have reviewed the patient's current medications. Scheduled Meds:    . amLODipine  10 mg Oral Daily  . bisacodyl  10 mg Rectal Once  . carvedilol  6.25 mg Oral BID WC  . colchicine  0.6 mg Oral BID  . docusate sodium  100 mg Oral BID  . feeding supplement  1 Container Oral TID BM  . methylPREDNISolone (SOLU-MEDROL) injection  40 mg  Intravenous Q24H  . polyethylene glycol  17 g Oral Daily  . potassium chloride SA  20 mEq Oral Daily  . sodium chloride  10 mL Intracatheter Q12H  . triamcinolone cream   Topical TID   Continuous Infusions:  PRN Meds:.acetaminophen, acetaminophen, HYDROcodone-acetaminophen, magnesium hydroxide, ondansetron (ZOFRAN) IV, sodium chloride, sodium chloride Assessment: Patient Active Hospital Problem List: 1. anemia 2. Bullous dermatological lesions 3. Facial rash 4. Neutropenia/leukopenia 5. Hypertension 6. Hyponatremia 7. Leukocytosis Plan:   1. Will continue supportive care and await the results of the biopsy and a Tzanck smear. The biopsy results are consistent with an inflammatory disease which could be a low-level Stevens-Johnson versus bullous pemphigus in this patient. It appears to be improving. Appreciate input from rheumatology and dermatology. 2. Increase mobility. Patient for inpatient rehabilitation  3. Continue chronic disease care. 4. The patient continues on steroids as recommended by rheumatology. However I will speak with on Monday to determine the course of steroid therapy expected to treat this condition as the diagnosis has not  fully been confirmed yet and I will defer to the guidance of rheumatology. 5. He is a leukocytosis felt to be secondary to steroids.  LOS: 16 days

## 2011-01-10 MED ORDER — PREDNISONE 20 MG PO TABS
40.0000 mg | ORAL_TABLET | Freq: Every day | ORAL | Status: DC
  Administered 2011-01-11 – 2011-01-12 (×2): 40 mg via ORAL
  Filled 2011-01-10 (×3): qty 2

## 2011-01-10 NOTE — Progress Notes (Signed)
Subjective: Patient without any complaints today except that she feels constipated. Her lesions continue to get better and she denies any soreness of the mouth.Pt still has not had a bowel movement since 2 days ago.  Objective: Filed Vitals:   01/09/11 1400 01/09/11 2129 01/10/11 0700 01/10/11 0944  BP: 113/70 133/55  117/68  Pulse: 70 67    Temp: 97.6 F (36.4 C) 98.1 F (36.7 C)    TempSrc: Oral Oral    Resp: 20 18    Height:      Weight:   101.107 kg (222 lb 14.4 oz)   SpO2: 96% 97%     Weight change: -0.393 kg (-13.9 oz)  Intake/Output Summary (Last 24 hours) at 01/10/11 0959 Last data filed at 01/09/11 2100  Gross per 24 hour  Intake    240 ml  Output   1900 ml  Net  -1660 ml    General: Alert, awake, oriented x3, in no acute distress.  HEENT: [place]/AT PEERL, EOMI Neck: Trachea midline,  no masses, no thyromegal,y no JVD, no carotid bruit OROPHARYNX:  Mucous membranes are dry and cracked.Marland Kitchen  Heart: Regular rate and rhythm, without murmurs, rubs, gallops, PMI non-displaced, no heaves or thrills on palpation.  Lungs: Clear to auscultation, no wheezing or rhonchi noted. No increased vocal fremitus resonant to percussion  Abdomen: Soft, nontender, nondistended, positive bowel sounds, no masses no hepatosplenomegaly noted..  Neuro: No focal neurological deficits noted cranial nerves II through XII grossly intact. DTRs 2+ bilaterally upper and lower extremities. Strength 5 out of 5 in bilateral upper and lower extremities. Musculoskeletal: No warm swelling or erythema around joints, no spinal tenderness noted. Psychiatric: Patient alert and oriented x3, good insight and cognition, good recent to remote recall. Lymph node survey: No cervical axillary or inguinal lymphadenopathy noted. Skin: The skin lesions are essentially healed. There no new lesions. There is no evidence of infection around the site of biopsy of the right arm.    Lab Results:  [facility] 01/08/11 2012 01/08/11  0527  NA 129* 136  K 4.1 3.8  CL 97 100  CO2 27 28  GLUCOSE 97 102*  BUN 21 17  CREATININE 0.83 0.66  CALCIUM 8.2* 8.3*  MG -- --  PHOS -- --   No results found for this basename: AST:2,ALT:2,ALKPHOS:2,BILITOT:2,PROT:2,ALBUMIN:2 in the last 72 hours No results found for this basename: LIPASE:2,AMYLASE:2 in the last 72 hours  Basename 01/08/11 2012 01/08/11 0527  WBC 20.4* 15.6*  NEUTROABS 17.0* 11.2*  HGB 10.3* 10.2*  HCT 31.1* 30.6*  MCV 89.6 89.7  PLT 468* 460*   No results found for this basename: CKTOTAL:3,CKMB:3,CKMBINDEX:3,TROPONINI:3 in the last 72 hours No results found for this basename: POCBNP:3 in the last 72 hours No results found for this basename: DDIMER:2 in the last 72 hours No results found for this basename: HGBA1C:2 in the last 72 hours No results found for this basename: CHOL:2,HDL:2,LDLCALC:2,TRIG:2,CHOLHDL:2,LDLDIRECT:2 in the last 72 hours No results found for this basename: TSH,T4TOTAL,FREET3,T3FREE,THYROIDAB in the last 72 hours No results found for this basename: VITAMINB12:2,FOLATE:2,FERRITIN:2,TIBC:2,IRON:2,RETICCTPCT:2 in the last 72 hours  Micro Results: Recent Results (from the past 240 hour(s))  CULTURE, SPUTUM-ASSESSMENT     Status: Normal   Collection Time   12/31/10  1:31 PM      Component Value Range Status Comment   Specimen Description SPUTUM   Final    Special Requests NONE   Final    Sputum evaluation     Final    Value:  MICROSCOPIC FINDINGS SUGGEST THAT THIS SPECIMEN IS NOT REPRESENTATIVE OF LOWER RESPIRATORY SECRETIONS. PLEASE RECOLLECT.     CALLED TO A AFATFAWO, RN 12/31/10 1413 BY K SCHULTZ   Report Status 12/31/2010 FINAL   Final     Studies/Results: Dg Chest 2 View  12/29/2010  *RADIOLOGY REPORT*  Clinical Data: Cough and congestion  CHEST - 2 VIEW  Comparison: 12/24/2010  Findings: Heart size appears normal.  No pleural effusion or pulmonary edema.  The lung volumes are decreased.  There is persistent bilateral perihilar  opacities which may be related to infiltrates and/or atelectasis.  Review of the visualized osseous structures is unremarkable.  IMPRESSION:  1.  No significant change in bilateral perihilar opacities.  Original Report Authenticated By: Rosealee Albee, M.D.   Dg Chest 2 View  12/25/2010  *RADIOLOGY REPORT*  Clinical Data: 76 year old female with cough, shortness of breath, pain, bright red blood per rectum.  CHEST - 2 VIEW  Comparison: 09/28/2010 and earlier.  Findings: Stable cardiomegaly and mediastinal contours.  Hilar prominence is stable and probably relates to central vascular enlargement.  The lungs are clear. No acute osseous abnormality identified.  IMPRESSION: Stable cardiomegaly.  No acute cardiopulmonary abnormality.  Original Report Authenticated By: Harley Hallmark, M.D.   Ct Chest W Contrast  01/04/2011  *RADIOLOGY REPORT*  Clinical Data: Left sided pleuritic chest pain.  CT CHEST WITH CONTRAST  Technique:  Multidetector CT imaging of the chest was performed following the standard protocol during bolus administration of intravenous contrast.  Contrast:  Chest radiograph 01/03/2011.  Comparison: 100 ml Omnipaque-300.  Findings: No pathologically enlarged mediastinal, hilar or axillary lymph nodes.  Heart is mildly enlarged.  No pericardial effusion. Tiny hiatal hernia.  Areas of central bronchiectasis and volume loss are seen bilaterally.  Minimal septal thickening at the lung bases, right greater than left, unchanged from prior exams.  No pleural fluid. Airway is unremarkable.  Incidental imaging of the upper abdomen shows a 2.9 cm low attenuation lesion in the left kidney, which is incompletely imaged.  No worrisome lytic or sclerotic lesions.  IMPRESSION:  No findings to explain the patient's given symptoms.  Original Report Authenticated By: Reyes Ivan, M.D.   Ct Abdomen Pelvis W Contrast  12/27/2010  *RADIOLOGY REPORT*  Clinical Data: Lower abdominal pain, constipation, history  of diverticulosis, bladder prolapse, and prior hysterectomy  CT ABDOMEN AND PELVIS WITH CONTRAST  Technique:  Multidetector CT imaging of the abdomen and pelvis was performed following the standard protocol during bolus administration of intravenous contrast.  Contrast: OMNIPAQUE IOHEXOL 300 MG/ML IV SOLN  Comparison: 09/30/2010  Findings: Motion degraded images.  Mild patchy opacity / scarring at the lung bases.  Liver, spleen, pancreas, and adrenal jets are within normal limits.  Gallbladder is unremarkable.  No intrahepatic or extrahepatic ductal dilatation.  Bilateral renal cysts.  No hydronephrosis.  No evidence of bowel obstruction.  Colonic diverticulosis, without associated inflammatory changes.  Atherosclerotic calcifications of the abdominal aorta and branch vessels.  No abdominopelvic ascites.  No suspicious abdominopelvic lymphadenopathy.  Status post hysterectomy.  Ovaries are unremarkable.  Bladder is within normal limits.  Tiny fat containing inguinal hernias.  Degenerative changes of the visualized thoracolumbar spine.  IMPRESSION: Motion degraded images.  No evidence of bowel obstruction.  Colonic diverticulosis, without associated inflammatory changes.  No CT findings to account for the patient's abdominal pain.  Original Report Authenticated By: Charline Bills, M.D.   Dg Chest Port 1 View  01/03/2011  *RADIOLOGY REPORT*  Clinical Data: Cough  PORTABLE CHEST - 1 VIEW  Comparison: 01/01/2011  Findings: Right arm PICC extends to the proximal SVC. Improved aeration, with residual linear scarring or atelectasis in the mid and lower left lung.  Right lung clear.  No effusion.  Stable mild cardiomegaly.  IMPRESSION: Improved aeration with minimal residual left perihilar scarring or atelectasis.  Original Report Authenticated By: Osa Craver, M.D.   Chest Portable 1 View Post Insertion To Confirm Placement As Interpreted By Radiologist  01/01/2011  *RADIOLOGY REPORT*  Clinical  Data: PICC line placement.  PORTABLE CHEST - 1 VIEW  Comparison: 12/31/2010  Findings: Right-sided PICC line noted the tip projecting over the SVC.  No pneumothorax.  Mildly improved perihilar opacities noted.  Mild cardiomegaly is present with tortuosity of the thoracic aorta.  There is linear subsegmental atelectasis at both lung bases.  IMPRESSION:  1.  Right PICC line tip:  SVC.  No pneumothorax. 2.  Reduced perihilar airspace opacity. 3.  Mild bibasilar subsegmental atelectasis. 4.  Mild cardiomegaly.  Original Report Authenticated By: Dellia Cloud, M.D.   Dg Chest Port 1 View  12/31/2010  *RADIOLOGY REPORT*  Clinical Data: Cough.  Fever.  Neutropenia.  PORTABLE CHEST - 1 VIEW  Comparison: 12/29/2010  Findings: Interval worsening of bilateral perihilar air space disease is seen compared to prior exam.  Peripheral atelectasis or scarring in the midlung zones is unchanged.  Cardiomegaly is stable.  No pleural effusions identified.  IMPRESSION: Interval worsening of bilateral perihilar air space disease.  Original Report Authenticated By: Danae Orleans, M.D.   Dg Abd 2 Views  12/25/2010  *RADIOLOGY REPORT*  Clinical Data: 76 year old female with abdominal pain.  Bright red blood from rectum.  ABDOMEN - 2 VIEW  Comparison: CT abdomen pelvis 09/30/2010 and earlier.  Findings: Supine and left-side down lateral decubitus views.  No pneumoperitoneum. Nonobstructed bowel gas pattern.  Lung bases appear normal. No acute osseous abnormality identified.  Chronic pelvic phleboliths.  IMPRESSION: Nonobstructed bowel gas pattern, no free air.  Original Report Authenticated By: Harley Hallmark, M.D.    Medications: I have reviewed the patient's current medications. Scheduled Meds:    . amLODipine  10 mg Oral Daily  . bisacodyl  10 mg Rectal Once  . carvedilol  6.25 mg Oral BID WC  . colchicine  0.6 mg Oral BID  . docusate sodium  100 mg Oral BID  . feeding supplement  1 Container Oral TID BM  .  polyethylene glycol  17 g Oral Daily  . potassium chloride SA  20 mEq Oral Daily  . predniSONE  40 mg Oral QAC breakfast  . sodium chloride  10 mL Intracatheter Q12H  . triamcinolone cream   Topical TID  . DISCONTD: methylPREDNISolone (SOLU-MEDROL) injection  40 mg Intravenous Q24H   Continuous Infusions:  PRN Meds:.acetaminophen, acetaminophen, HYDROcodone-acetaminophen, magnesium hydroxide, ondansetron (ZOFRAN) IV, sodium chloride, sodium chloride Assessment: Patient Active Hospital Problem List: 1. anemia 2. Bullous dermatological lesions 3. Facial rash 4. Neutropenia/leukopenia 5. Hypertension 6. Hyponatremia 7. Leukocytosis 8. Constipation Plan:   1. Will continue supportive care and await the results of the biopsy and a Tzanck smear. The biopsy results are consistent with an inflammatory disease which could be a low-level Stevens-Johnson versus bullous pemphigus in this patient. It appears to be improving. Appreciate input from rheumatology and dermatology.Have changed to oral steroids. Will verify course and duration with Dr. Zenovia Jordan on tomorrrrow. 2. Increase mobility. Patient for inpatient rehabilitation  3. Continue  chronic disease care. 4. The patient continues on steroids as recommended by rheumatology. However I will speak with on Monday to determine the course of steroid therapy expected to treat this condition as the diagnosis has not fully been confirmed yet and I will defer to the guidance of rheumatology. 5. He is a leukocytosis felt to be secondary to steroids. 6. Dulcolax supossitory. If no results then enema.  LOS: 17 days

## 2011-01-10 NOTE — Progress Notes (Signed)
Pt  Refused ducolox supp  Pt had bowel movement x 2 this pm

## 2011-01-11 ENCOUNTER — Telehealth: Payer: Self-pay | Admitting: Cardiology

## 2011-01-11 LAB — BASIC METABOLIC PANEL
BUN: 14 mg/dL (ref 6–23)
CO2: 31 mEq/L (ref 19–32)
Chloride: 99 mEq/L (ref 96–112)
Glucose, Bld: 96 mg/dL (ref 70–99)
Potassium: 3.5 mEq/L (ref 3.5–5.1)

## 2011-01-11 LAB — GLUCOSE, CAPILLARY: Glucose-Capillary: 139 mg/dL — ABNORMAL HIGH (ref 70–99)

## 2011-01-11 NOTE — Telephone Encounter (Signed)
Pt is still in the hospital and she had an appointment with Dr Antoine Poche tomorrow.  Appointment was cancelled.  Daughter wanted to make sure Dr Antoine Poche knew the pt is still in the hospital.  Will forward to him for his information.

## 2011-01-11 NOTE — Progress Notes (Signed)
Csw spoke with pt regarding bed offers, and patient plans to dc to [facility] when medically stable. CSW confirmed with snf who confirmed dc plans. CSW continuing to follow to assist with pt dc plans.   [name], [place]  [phone]  .01/11/2011  12:54

## 2011-01-11 NOTE — Telephone Encounter (Signed)
New problem Pt's daughter wanted to talk to you about her mother. She is still in hospital and will be there tomorrow. Please call

## 2011-01-11 NOTE — Progress Notes (Signed)
PT Cancellation Note  Treatment cancelled today due to patient's refusal (x 4 attempts) to participate secondary to pain and having just gotten back in bed after ~4 hours in the chair. Pt educated on importance on increasing mobility and strength to promote return home. Pt verbalized understanding, agreed to participate tomorrow.   Thanks!   Zaineb Nowaczyk (Beverely Pace) Carleene Mains PT, DPT Acute Rehabilitation [phone]

## 2011-01-11 NOTE — Progress Notes (Signed)
Subjective: Patient without any complaints today except that she feels constipated. Her lesions continue to get better and she denies any soreness of the mouth.Pt still has not had a bowel movement since 2 days ago.  Objective: Filed Vitals:   01/11/11 0200 01/11/11 0600 01/11/11 1000 01/11/11 1400  BP: 118/62 122/51 120/63 117/64  Pulse: 67 65 64 70  Temp: 97.7 F (36.5 C) 97.6 F (36.4 C) 97.9 F (36.6 C) 97.8 F (36.6 C)  TempSrc: Oral Oral Oral Oral  Resp: 18 18 18 18   Height:      Weight:  101.2 kg (223 lb 1.7 oz)    SpO2: 98% 99% 97% 97%   Weight change: 0.093 kg (3.3 oz)  Intake/Output Summary (Last 24 hours) at 01/11/11 1621 Last data filed at 01/11/11 1020  Gross per 24 hour  Intake    600 ml  Output   3025 ml  Net  -2425 ml    General: Alert, awake, oriented x3, in no acute distress.  HEENT: [place]/AT [place]ER[place], EOMI Neck: Trachea midline,  no masses, no thyromegal,y no JVD, no carotid bruit OROPHARYNX:  Mucous membranes are dry and cracked.Marland Kitchen  Heart: Regular rate and rhythm, without murmurs, rubs, gallops, PMI non-displaced, no heaves or thrills on palpation.  Lungs: Clear to auscultation, no wheezing or rhonchi noted. No increased vocal fremitus resonant to percussion  Abdomen: Soft, nontender, nondistended, positive bowel sounds, no masses no hepatosplenomegaly noted..  Neuro: No focal neurological deficits noted cranial nerves II through XII grossly intact. DTRs 2+ bilaterally upper and lower extremities. Strength 5 out of 5 in bilateral upper and lower extremities. Musculoskeletal: No warm swelling or erythema around joints, no spinal tenderness noted. Psychiatric: Patient alert and oriented x3, good insight and cognition, good recent to remote recall. Lymph node survey: No cervical axillary or inguinal lymphadenopathy noted. Skin: The skin lesions are essentially healed. There no new lesions. There is no evidence of infection around the site of biopsy of the right  arm.    Lab Results:  [facility] 01/11/11 0500 01/08/11 2012  NA 136 129*  K 3.5 4.1  CL 99 97  CO2 31 27  GLUCOSE 96 97  BUN 14 21  CREATININE 0.62 0.83  CALCIUM 8.4 8.2*  MG -- --  PHOS -- --   No results found for this basename: AST:2,ALT:2,ALKPHOS:2,BILITOT:2,PROT:2,ALBUMIN:2 in the last 72 hours No results found for this basename: LIPASE:2,AMYLASE:2 in the last 72 hours  Basename 01/08/11 2012  WBC 20.4*  NEUTROABS 17.0*  HGB 10.3*  HCT 31.1*  MCV 89.6  PLT 468*   No results found for this basename: CKTOTAL:3,CKMB:3,CKMBINDEX:3,TROPONINI:3 in the last 72 hours No results found for this basename: POCBNP:3 in the last 72 hours No results found for this basename: DDIMER:2 in the last 72 hours No results found for this basename: HGBA1C:2 in the last 72 hours No results found for this basename: CHOL:2,HDL:2,LDLCALC:2,TRIG:2,CHOLHDL:2,LDLDIRECT:2 in the last 72 hours No results found for this basename: TSH,T4TOTAL,FREET3,T3FREE,THYROIDAB in the last 72 hours No results found for this basename: VITAMINB12:2,FOLATE:2,FERRITIN:2,TIBC:2,IRON:2,RETICCTPCT:2 in the last 72 hours  Micro Results: No results found for this or any previous visit (from the past 240 hour(s)).  Studies/Results: Dg Chest 2 View  12/29/2010  *RADIOLOGY REPORT*  Clinical Data: Cough and congestion  CHEST - 2 VIEW  Comparison: 12/24/2010  Findings: Heart size appears normal.  No pleural effusion or pulmonary edema.  The lung volumes are decreased.  There is persistent bilateral perihilar opacities which may be related  to infiltrates and/or atelectasis.  Review of the visualized osseous structures is unremarkable.  IMPRESSION:  1.  No significant change in bilateral perihilar opacities.  Original Report Authenticated By: Rosealee Albee, M.D.   Dg Chest 2 View  12/25/2010  *RADIOLOGY REPORT*  Clinical Data: 76 year old female with cough, shortness of breath, pain, bright red blood per rectum.  CHEST - 2  VIEW  Comparison: 09/28/2010 and earlier.  Findings: Stable cardiomegaly and mediastinal contours.  Hilar prominence is stable and probably relates to central vascular enlargement.  The lungs are clear. No acute osseous abnormality identified.  IMPRESSION: Stable cardiomegaly.  No acute cardiopulmonary abnormality.  Original Report Authenticated By: Harley Hallmark, M.D.   Ct Chest W Contrast  01/04/2011  *RADIOLOGY REPORT*  Clinical Data: Left sided pleuritic chest pain.  CT CHEST WITH CONTRAST  Technique:  Multidetector CT imaging of the chest was performed following the standard protocol during bolus administration of intravenous contrast.  Contrast:  Chest radiograph 01/03/2011.  Comparison: 100 ml Omnipaque-300.  Findings: No pathologically enlarged mediastinal, hilar or axillary lymph nodes.  Heart is mildly enlarged.  No pericardial effusion. Tiny hiatal hernia.  Areas of central bronchiectasis and volume loss are seen bilaterally.  Minimal septal thickening at the lung bases, right greater than left, unchanged from prior exams.  No pleural fluid. Airway is unremarkable.  Incidental imaging of the upper abdomen shows a 2.9 cm low attenuation lesion in the left kidney, which is incompletely imaged.  No worrisome lytic or sclerotic lesions.  IMPRESSION:  No findings to explain the patient's given symptoms.  Original Report Authenticated By: Reyes Ivan, M.D.   Ct Abdomen Pelvis W Contrast  12/27/2010  *RADIOLOGY REPORT*  Clinical Data: Lower abdominal pain, constipation, history of diverticulosis, bladder prolapse, and prior hysterectomy  CT ABDOMEN AND PELVIS WITH CONTRAST  Technique:  Multidetector CT imaging of the abdomen and pelvis was performed following the standard protocol during bolus administration of intravenous contrast.  Contrast: OMNIPAQUE IOHEXOL 300 MG/ML IV SOLN  Comparison: 09/30/2010  Findings: Motion degraded images.  Mild patchy opacity / scarring at the lung bases.   Liver, spleen, pancreas, and adrenal jets are within normal limits.  Gallbladder is unremarkable.  No intrahepatic or extrahepatic ductal dilatation.  Bilateral renal cysts.  No hydronephrosis.  No evidence of bowel obstruction.  Colonic diverticulosis, without associated inflammatory changes.  Atherosclerotic calcifications of the abdominal aorta and branch vessels.  No abdominopelvic ascites.  No suspicious abdominopelvic lymphadenopathy.  Status post hysterectomy.  Ovaries are unremarkable.  Bladder is within normal limits.  Tiny fat containing inguinal hernias.  Degenerative changes of the visualized thoracolumbar spine.  IMPRESSION: Motion degraded images.  No evidence of bowel obstruction.  Colonic diverticulosis, without associated inflammatory changes.  No CT findings to account for the patient's abdominal pain.  Original Report Authenticated By: Charline Bills, M.D.   Dg Chest Port 1 View  01/03/2011  *RADIOLOGY REPORT*  Clinical Data: Cough  PORTABLE CHEST - 1 VIEW  Comparison: 01/01/2011  Findings: Right arm PICC extends to the proximal SVC. Improved aeration, with residual linear scarring or atelectasis in the mid and lower left lung.  Right lung clear.  No effusion.  Stable mild cardiomegaly.  IMPRESSION: Improved aeration with minimal residual left perihilar scarring or atelectasis.  Original Report Authenticated By: Osa Craver, M.D.   Chest Portable 1 View Post Insertion To Confirm Placement As Interpreted By Radiologist  01/01/2011  *RADIOLOGY REPORT*  Clinical Data: PICC line  placement.  PORTABLE CHEST - 1 VIEW  Comparison: 12/31/2010  Findings: Right-sided PICC line noted the tip projecting over the SVC.  No pneumothorax.  Mildly improved perihilar opacities noted.  Mild cardiomegaly is present with tortuosity of the thoracic aorta.  There is linear subsegmental atelectasis at both lung bases.  IMPRESSION:  1.  Right PICC line tip:  SVC.  No pneumothorax. 2.  Reduced perihilar  airspace opacity. 3.  Mild bibasilar subsegmental atelectasis. 4.  Mild cardiomegaly.  Original Report Authenticated By: Dellia Cloud, M.D.   Dg Chest Port 1 View  12/31/2010  *RADIOLOGY REPORT*  Clinical Data: Cough.  Fever.  Neutropenia.  PORTABLE CHEST - 1 VIEW  Comparison: 12/29/2010  Findings: Interval worsening of bilateral perihilar air space disease is seen compared to prior exam.  Peripheral atelectasis or scarring in the midlung zones is unchanged.  Cardiomegaly is stable.  No pleural effusions identified.  IMPRESSION: Interval worsening of bilateral perihilar air space disease.  Original Report Authenticated By: Danae Orleans, M.D.   Dg Abd 2 Views  12/25/2010  *RADIOLOGY REPORT*  Clinical Data: 76 year old female with abdominal pain.  Bright red blood from rectum.  ABDOMEN - 2 VIEW  Comparison: CT abdomen pelvis 09/30/2010 and earlier.  Findings: Supine and left-side down lateral decubitus views.  No pneumoperitoneum. Nonobstructed bowel gas pattern.  Lung bases appear normal. No acute osseous abnormality identified.  Chronic pelvic phleboliths.  IMPRESSION: Nonobstructed bowel gas pattern, no free air.  Original Report Authenticated By: Harley Hallmark, M.D.    Medications: I have reviewed the patient's current medications. Scheduled Meds:    . amLODipine  10 mg Oral Daily  . bisacodyl  10 mg Rectal Once  . carvedilol  6.25 mg Oral BID WC  . colchicine  0.6 mg Oral BID  . docusate sodium  100 mg Oral BID  . feeding supplement  1 Container Oral TID BM  . polyethylene glycol  17 g Oral Daily  . potassium chloride SA  20 mEq Oral Daily  . predniSONE  40 mg Oral QAC breakfast  . sodium chloride  10 mL Intracatheter Q12H  . triamcinolone cream   Topical TID   Continuous Infusions:  PRN Meds:.acetaminophen, acetaminophen, HYDROcodone-acetaminophen, magnesium hydroxide, ondansetron (ZOFRAN) IV, sodium chloride, sodium chloride Assessment: Patient Active Hospital Problem  List: 1. anemia 2. Bullous dermatological lesions-S/P biopsy. 3. Facial rash 4. Neutropenia/leukopenia 5. Hypertension 6. Hyponatremia 7. Leukocytosis 8. Constipation Plan:   1. Will continue supportive care and await the results of the biopsy and a Tzanck smear. The biopsy results are consistent with an inflammatory disease/vasculitiswhich could be a low-level Stevens-Johnson versus bullous pemphigus vs vasculitis in this patient. It appears to be improving. Appreciate input from rheumatology and dermatology.Have changed to oral steroids. I've spoken with Dr. [place] who recommends taper of steroids over 1 week and F/U with dermatology if any further eruptions. 2. Increase mobility. Patient for inpatient rehabilitation  3. Continue chronic disease care. 4. The patient continues on steroids. 5.  Leukocytosis felt to be secondary to steroids. 6. Dulcolax supossitory. If no results then enema. 7. Suture from biopsy to be removed today.  LOS: 18 days

## 2011-01-12 ENCOUNTER — Ambulatory Visit: Payer: Medicare Other | Admitting: Cardiology

## 2011-01-12 MED ORDER — PREDNISONE (PAK) 10 MG PO TABS: 10.0000 mg | ORAL_TABLET | Freq: Every day | ORAL | Status: AC

## 2011-01-12 MED ORDER — COLCHICINE 0.6 MG PO TABS: 0.6000 mg | ORAL_TABLET | Freq: Two times a day (BID) | ORAL | Status: DC

## 2011-01-12 MED ORDER — AMLODIPINE BESYLATE 10 MG PO TABS: 10.0000 mg | ORAL_TABLET | Freq: Every day | ORAL | Status: DC

## 2011-01-12 MED ORDER — HYDROCODONE-ACETAMINOPHEN 5-325 MG PO TABS: 1.0000 | ORAL_TABLET | ORAL | Status: AC | PRN

## 2011-01-12 MED ORDER — ENSURE PUDDING PO PUDG: 1.0000 | Freq: Three times a day (TID) | ORAL | Status: DC

## 2011-01-12 MED ORDER — SALINE SPRAY 0.65 % NA SOLN: 2.0000 | NASAL | Status: DC | PRN

## 2011-01-12 NOTE — Progress Notes (Signed)
Late entry/ error within system.   .Clinical social worker completed patient psychosocial assessment, please see assessment in patient shadow chart.  Pt open to short term rehab if needed when patient is medically stable. CSW Continuing to follow to assist with pt dc plans.   Catha Gosselin, Theresia Majors  [phone] 12/28/2010

## 2011-01-12 NOTE — Progress Notes (Signed)
Pt given dc instructions. Rn went over medications and incision/wound care. Pt verbalized understanding and had no questions regarding care of instructions.  

## 2011-01-12 NOTE — Discharge Summary (Signed)
[name] MRN: [id number] DOB/AGE: 74/31/1936 74 y.o.  Admit date: 12/24/2010 Discharge date: 01/12/2011  Primary Care Physician:  Astrid Divine, MD, MD   Discharge Diagnoses:   Patient Active Problem List  Diagnoses  . HYPOPOTASSEMIA  . HYPERTENSION  . CARDIOMYOPATHY  . COMBINED HEART FAILURE, CHRONIC  . CONSTIPATION, CHRONIC  . ANASARCA  . DIVERTICULOSIS  . Mitral regurgitation  . Systolic and diastolic CHF, chronic  . Female bladder prolapse  . Chronic pain  . Hematochezia  . Dehydration  . Thrombocytosis  . Hyponatremia  . Abdominal pain  . Facial edema  . Leukopenia  . Neutropenia    DISCHARGE MEDICATION: Current Discharge Medication List    START taking these medications   Details  amLODipine (NORVASC) 10 MG tablet Take 1 tablet (10 mg total) by mouth daily. Qty: 30 tablet, Refills: 0    colchicine 0.6 MG tablet Take 1 tablet (0.6 mg total) by mouth 2 (two) times daily. Qty: 60 tablet, Refills: 0    feeding supplement (ENSURE) PUDG Take 1 Container by mouth 3 (three) times daily between meals. Qty: 90 Can, Refills: 0    HYDROcodone-acetaminophen (NORCO) 5-325 MG per tablet Take 1 tablet by mouth every 4 (four) hours as needed. Qty: 30 tablet, Refills: 0    sodium chloride (OCEAN) 0.65 % SOLN nasal spray Place 2 sprays into the nose as needed for congestion. Qty: 15 mL, Refills: 0      CONTINUE these medications which have NOT CHANGED   Details  aspirin 81 MG tablet Take 81 mg by mouth daily.      bisacodyl (DULCOLAX) 5 MG EC tablet Take 5 mg by mouth daily. For    carvedilol (COREG) 3.125 MG tablet      conjugated estrogens (PREMARIN) vaginal cream Place 0.5 g vaginally daily.      ferrous fumarate (FERRETTS) 325 (106 FE) MG TABS Take by mouth 2 (two) times daily.      KLOR-CON M20 20 MEQ tablet take 2 tablets by mouth twice a day Qty: 120 tablet, Refills: 6    traMADol (ULTRAM) 50 MG tablet Take 50 mg by mouth every 6 (six) hours  as needed. For pain      STOP taking these medications     furosemide (LASIX) 40 MG tablet      lisinopril (PRINIVIL,ZESTRIL) 10 MG tablet      estrogens, conjugated, (PREMARIN) 0.625 MG tablet      furosemide (LASIX) 80 MG tablet            Consults: Treatment Team:  Velora Heckler. Arbutus Ped, MD Kathlee Nations Hawkes-rheumatology Amy Y Jordan-dermatology    SIGNIFICANT DIAGNOSTIC STUDIES:  Dg Chest 2 View  12/29/2010  *RADIOLOGY REPORT*  Clinical Data: Cough and congestion  CHEST - 2 VIEW  Comparison: 12/24/2010  Findings: Heart size appears normal.  No pleural effusion or pulmonary edema.  The lung volumes are decreased.  There is persistent bilateral perihilar opacities which may be related to infiltrates and/or atelectasis.  Review of the visualized osseous structures is unremarkable.  IMPRESSION:  1.  No significant change in bilateral perihilar opacities.  Original Report Authenticated By: Rosealee Albee, M.D.   Dg Chest 2 View  12/25/2010  *RADIOLOGY REPORT*  Clinical Data: 76 year old female with cough, shortness of breath, pain, bright red blood per rectum.  CHEST - 2 VIEW  Comparison: 09/28/2010 and earlier.  Findings: Stable cardiomegaly and mediastinal contours.  Hilar prominence is stable and probably relates to central vascular  enlargement.  The lungs are clear. No acute osseous abnormality identified.  IMPRESSION: Stable cardiomegaly.  No acute cardiopulmonary abnormality.  Original Report Authenticated By: Harley Hallmark, M.D.   Ct Chest W Contrast  01/04/2011  *RADIOLOGY REPORT*  Clinical Data: Left sided pleuritic chest pain.  CT CHEST WITH CONTRAST  Technique:  Multidetector CT imaging of the chest was performed following the standard protocol during bolus administration of intravenous contrast.  Contrast:  Chest radiograph 01/03/2011.  Comparison: 100 ml Omnipaque-300.  Findings: No pathologically enlarged mediastinal, hilar or axillary lymph nodes.  Heart is mildly  enlarged.  No pericardial effusion. Tiny hiatal hernia.  Areas of central bronchiectasis and volume loss are seen bilaterally.  Minimal septal thickening at the lung bases, right greater than left, unchanged from prior exams.  No pleural fluid. Airway is unremarkable.  Incidental imaging of the upper abdomen shows a 2.9 cm low attenuation lesion in the left kidney, which is incompletely imaged.  No worrisome lytic or sclerotic lesions.  IMPRESSION:  No findings to explain the patient's given symptoms.  Original Report Authenticated By: Reyes Ivan, M.D.   Ct Abdomen Pelvis W Contrast  12/27/2010  *RADIOLOGY REPORT*  Clinical Data: Lower abdominal pain, constipation, history of diverticulosis, bladder prolapse, and prior hysterectomy  CT ABDOMEN AND PELVIS WITH CONTRAST  Technique:  Multidetector CT imaging of the abdomen and pelvis was performed following the standard protocol during bolus administration of intravenous contrast.  Contrast: OMNIPAQUE IOHEXOL 300 MG/ML IV SOLN  Comparison: 09/30/2010  Findings: Motion degraded images.  Mild patchy opacity / scarring at the lung bases.  Liver, spleen, pancreas, and adrenal jets are within normal limits.  Gallbladder is unremarkable.  No intrahepatic or extrahepatic ductal dilatation.  Bilateral renal cysts.  No hydronephrosis.  No evidence of bowel obstruction.  Colonic diverticulosis, without associated inflammatory changes.  Atherosclerotic calcifications of the abdominal aorta and branch vessels.  No abdominopelvic ascites.  No suspicious abdominopelvic lymphadenopathy.  Status post hysterectomy.  Ovaries are unremarkable.  Bladder is within normal limits.  Tiny fat containing inguinal hernias.  Degenerative changes of the visualized thoracolumbar spine.  IMPRESSION: Motion degraded images.  No evidence of bowel obstruction.  Colonic diverticulosis, without associated inflammatory changes.  No CT findings to account for the patient's abdominal pain.   Original Report Authenticated By: Charline Bills, M.D.   Dg Chest Port 1 View  01/03/2011  *RADIOLOGY REPORT*  Clinical Data: Cough  PORTABLE CHEST - 1 VIEW  Comparison: 01/01/2011  Findings: Right arm PICC extends to the proximal SVC. Improved aeration, with residual linear scarring or atelectasis in the mid and lower left lung.  Right lung clear.  No effusion.  Stable mild cardiomegaly.  IMPRESSION: Improved aeration with minimal residual left perihilar scarring or atelectasis.  Original Report Authenticated By: Osa Craver, M.D.   Chest Portable 1 View Post Insertion To Confirm Placement As Interpreted By Radiologist  01/01/2011  *RADIOLOGY REPORT*  Clinical Data: PICC line placement.  PORTABLE CHEST - 1 VIEW  Comparison: 12/31/2010  Findings: Right-sided PICC line noted the tip projecting over the SVC.  No pneumothorax.  Mildly improved perihilar opacities noted.  Mild cardiomegaly is present with tortuosity of the thoracic aorta.  There is linear subsegmental atelectasis at both lung bases.  IMPRESSION:  1.  Right PICC line tip:  SVC.  No pneumothorax. 2.  Reduced perihilar airspace opacity. 3.  Mild bibasilar subsegmental atelectasis. 4.  Mild cardiomegaly.  Original Report Authenticated By: Soyla Murphy.  Ova Freshwater, M.D.   Dg Chest Port 1 View  12/31/2010  *RADIOLOGY REPORT*  Clinical Data: Cough.  Fever.  Neutropenia.  PORTABLE CHEST - 1 VIEW  Comparison: 12/29/2010  Findings: Interval worsening of bilateral perihilar air space disease is seen compared to prior exam.  Peripheral atelectasis or scarring in the midlung zones is unchanged.  Cardiomegaly is stable.  No pleural effusions identified.  IMPRESSION: Interval worsening of bilateral perihilar air space disease.  Original Report Authenticated By: Danae Orleans, M.D.   Dg Abd 2 Views  12/25/2010  *RADIOLOGY REPORT*  Clinical Data: 76 year old female with abdominal pain.  Bright red blood from rectum.  ABDOMEN - 2 VIEW  Comparison:  CT abdomen pelvis 09/30/2010 and earlier.  Findings: Supine and left-side down lateral decubitus views.  No pneumoperitoneum. Nonobstructed bowel gas pattern.  Lung bases appear normal. No acute osseous abnormality identified.  Chronic pelvic phleboliths.  IMPRESSION: Nonobstructed bowel gas pattern, no free air.  Original Report Authenticated By: Harley Hallmark, M.D.    No results found for this or any previous visit (from the past 240 hour(s)).  BRIEF ADMITTING H & P: 76yoF with h/o systolic and diastolic CHF with (EF 45%), hypertension, chronic  constipation, chronic pain, diverticulosis who presents with hematochezia and likely dehydration.  She is known to this Clinical research associate as I admitted her 2 months ago with a multitude of  complaints, including pain all over her body from her head to her toe, abdominal pain,  malaise. She was also having episcleritis at that time, and Ophtho had recommended  Rheumatologic evaluation. W/u that admission showed a negative ANA, ESR, CRP, C3/C4, RF,  HIV, RPR, and normal TSH. Per d/c summary, possibly had positive P-ANCA, but I cannot find this lab value  and unclear what the upshot of that was, however she was supposed to see a Rheumatologist Dr. Nickola Major on  discharge which that patient said she did, but can't give any more info about that.  Today she comes in with a couple episodes of BRBPR and pain all over her body. She reports having 2 BM's this  morning that were normal, but then later in the afternoon had a couple more that were BRBPR, which scared her.  She states she's never had this before, but when asked about the Dx of diverticulosis she carries, she seems to  know what it is but not sure why she carries this Dx.  Vitals in the ED stable. Rectal exam in the ED showed external hemorrhoids. W/u was otherwise unremarkable, her chronic  hypoNa is at baseline, however Cr is a bit higher at 1.06 from baseline of 0.5-0.6, and Hct is 34.3 which is up from prior  value of 29,  plts are at 520, so she could in fact be a bit dehydrated. UA was negative, fecal occult blood was positive. She had abd plain  film and CXR that were unremarkable.  They tried to discharge her from the ED, however when trying to get her up out of bed, she was so weak they couldn't walk  her and so Obs admission was requested.     Hospital Course:  Present on Admission:  Bullous Lesions: On presentation the patient had bullous/vesicular lesions discretely on the bilateral upper extremities, and some ulcerations on the lower lip. It was unclear as to the etiology of this particularly in light of the fact that the patient also had a neutropenia. Oncology was consult who felt the patient did not require bone marrow  biopsy and they felt that this was likely some type of inflammatory/viral illness. He recommended rheumatological evaluation and Dr. Nickola Major from rheumatology she saw the patient in consultation but did not feel that this was an autoimmune/rheumatological disease. Subsequently Dr. 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 Early in the course of her hospitalization the patient was started empirically on steroids and has improved considerably. Dr. [place] recommended that the patient completed a tapering dose of steroids over the next week. If the patient had any reappearance of the lesions that she would recommend outpatient follow for this patient with dermatology.  Chronic pain:  The patient is continued on her Ultram and Norco. The patient has a diagnosis of fibromyalgia and has chronic pain which is unchanged. This has limited her mobility and the patient is being discharged to skilled nursing facility for short-term rehabilitation.  Hematochezia:  Patient had some hematochezia early in hospital course which was self-limited. Her hemoglobins remained stable   Dehydration:  On admission the patient was dehydrated she  received IV fluid resuscitation and has been able to maintain her hydration state orally without any artificial means.   Systolic and diastolic CHF, chronic:  The patient has chronic systolic and diastolic cardiovascular dysfunction. This is a stable during this hospitalization and she has not required any intervention. The patient is under care of Dr. Berna Bue for that and to followup with him as a rescheduled appointment since her already scheduled appointment occurred while she was in the hospital.   Neutropenia: The patient had initial neutropenia which is also confirmed on the biopsy of the lesion. However the patient has had a reactive leukocytosis in response of the steroids and is no longer neutropenic. It is felt that this is likely to whatever was the cause of her vasculitis likely a viral condition.  Hyponatremia:  Felt to be likely to dehydration, the hyponatremia has resolved.   HYPERTENSION:  Blood pressure well controlled  CONSTIPATION, CHRONIC:  The patient requires chronic laxatives for her GI toiletry.   Abdominal pain: Chronic at baseline.  Dysphagia: Patient was evaluated by SLP and recommended a Dysphagia II diet with thin liquids.  Disposition and Follow-up:  Patient is being discharged to a skilled facility for short-term rehabilitation. The patient should followup with her primary care physician Dr. Maurice Small as necessary. She should also followup with Dr. Antoine Poche on a rescheduled appointment. In terms of her recent hospitalization and the dermatological condition Dr. Swaziland recommends the patient should followup with her only if she has a reappearance of the lesions.   DISCHARGE EXAM:  General: Alert, awake, oriented x3, in no acute distress. Blood pressure 141/57, pulse 69, temperature 98 F (36.7 C), temperature source Oral, resp. rate 18, height 5\' 1"  (1.549 m), weight 98.9 kg (218 lb 0.6 oz), SpO2 100.00%  HEENT: [place]/AT PEERL, EOMI  Neck: Trachea  midline, no masses, no thyromegal,y no JVD, no carotid bruit  OROPHARYNX: Mucous membranes are dry and cracked.Marland Kitchen  Heart: Regular rate and rhythm, without murmurs, rubs, gallops, PMI non-displaced, no heaves or thrills on palpation.  Lungs: Clear to auscultation, no wheezing or rhonchi noted. No increased vocal fremitus resonant to percussion  Abdomen: Soft, nontender, nondistended, positive bowel sounds, no masses no hepatosplenomegaly noted..  Neuro: No focal neurological deficits noted cranial nerves II through XII grossly intact. DTRs 2+ bilaterally upper and lower extremities. Strength 5 out of 5 in bilateral upper and lower extremities.  Musculoskeletal: No warm swelling  or erythema around joints, no spinal tenderness noted.  Psychiatric: Patient alert and oriented x3, good insight and cognition, good recent to remote recall.  Lymph node survey: No cervical axillary or inguinal lymphadenopathy noted.  Skin: The skin lesions are essentially healed. There no new lesions. There is no evidence of infection around the site of biopsy of the right arm    Basename 01/11/11 0500  NA 136  K 3.5  CL 99  CO2 31  GLUCOSE 96  BUN 14  CREATININE 0.62  CALCIUM 8.4  MG --  PHOS --   No results found for this basename: AST:2,ALT:2,ALKPHOS:2,BILITOT:2,PROT:2,ALBUMIN:2 in the last 72 hours No results found for this basename: LIPASE:2,AMYLASE:2 in the last 72 hours No results found for this basename: WBC:2,NEUTROABS:2,HGB:2,HCT:2,MCV:2,PLT:2 in the last 72 hours  Signed: Channelle Bottger A. 01/12/2011, 12:34 PM

## 2011-01-12 NOTE — Progress Notes (Signed)
.  Clinical social worker assisted with patient discharge to skilled nursing facility. .Patient transportation provided by [employer] [facility] with patient chart copy. .No further Clinical Social Work needs, signing off.    Catha Gosselin, Theresia Majors  [phone] .01/12/2011 14:58pm

## 2011-01-12 NOTE — Progress Notes (Signed)
Pt d/c to SNF today.

## 2011-01-14 NOTE — Progress Notes (Signed)
This patient was discussed at long LOS rounds 12.05.12  

## 2011-03-04 ENCOUNTER — Encounter: Payer: Self-pay | Admitting: Cardiology

## 2011-03-04 ENCOUNTER — Ambulatory Visit (INDEPENDENT_AMBULATORY_CARE_PROVIDER_SITE_OTHER): Payer: PRIVATE HEALTH INSURANCE | Admitting: Cardiology

## 2011-03-04 DIAGNOSIS — E871 Hypo-osmolality and hyponatremia: Secondary | ICD-10-CM

## 2011-03-04 DIAGNOSIS — I10 Essential (primary) hypertension: Secondary | ICD-10-CM

## 2011-03-04 DIAGNOSIS — I5042 Chronic combined systolic (congestive) and diastolic (congestive) heart failure: Secondary | ICD-10-CM

## 2011-03-04 MED ORDER — CARVEDILOL 3.125 MG PO TABS: 3.1250 mg | ORAL_TABLET | Freq: Two times a day (BID) | ORAL | Status: DC

## 2011-03-04 NOTE — Assessment & Plan Note (Signed)
At this point I think she is euvolemic. I will check a basic metabolic profile. I will begin to up titrate her medications by increasing her carvedilol to twice a day. She will remain off ACE inhibitors and ARB because of apparent hypotension during her recent hospitalization.

## 2011-03-04 NOTE — Assessment & Plan Note (Signed)
She had this in the hospital though it was improved at discharge. I will followup a basic metabolic profile today.

## 2011-03-04 NOTE — Patient Instructions (Signed)
Please increase your Carvedilol to 3.125 mg twice a day Continue all other medications as listed  Have blood work today  Follow up in 1 month

## 2011-03-04 NOTE — Progress Notes (Signed)
HPI The patient presents for followup of her mixed heart failure. Since I last saw her she was in the hospital with bleeding secondary to external hemorrhoids. I have reviewed extensively these hospital records. I do not see that cardiology was called. She was taken off of lisinopril possibly for hypotension. She also had some blistering in not sure if any of her medications were thought to be the cause. Her carvedilol was reduced. She spent a month at a rehabilitation facility. She's back at home now. She's not describing any new shortness of breath, PND or orthopnea. She's not describing any chest pressure, neck or arm discomfort. She's not describing any palpitations, presyncope or syncope. She is having no weight gain or edema. She's getting around with a walker with home physical therapy.  Allergies  Allergen Reactions  . Latex   . Penicillins   . Tenivac     Current Outpatient Prescriptions  Medication Sig Dispense Refill  . amLODipine (NORVASC) 10 MG tablet Take 1 tablet (10 mg total) by mouth daily.  30 tablet  0  . aspirin 81 MG tablet Take 81 mg by mouth daily.        . bisacodyl (DULCOLAX) 5 MG EC tablet Take 5 mg by mouth daily. For      . carvedilol (COREG) 3.125 MG tablet        . colchicine 0.6 MG tablet Take 1 tablet (0.6 mg total) by mouth 2 (two) times daily.  60 tablet  0  . conjugated estrogens (PREMARIN) vaginal cream Place 0.5 g vaginally daily.        . feeding supplement (ENSURE) PUDG Take 1 Container by mouth 3 (three) times daily between meals.  90 Can  0  . ferrous fumarate (FERRETTS) 325 (106 FE) MG TABS Take by mouth 2 (two) times daily.        Marland Kitchen KLOR-CON M20 20 MEQ tablet take 2 tablets by mouth twice a day  120 tablet  6  . sodium chloride (OCEAN) 0.65 % SOLN nasal spray Place 2 sprays into the nose as needed for congestion.  15 mL  0  . traMADol (ULTRAM) 50 MG tablet Take 50 mg by mouth every 6 (six) hours as needed. For pain        Past Medical History    Diagnosis Date  . HTN (hypertension)   . Diverticulosis   . Hemorrhoid   . Osteoarthritis   . Constipation, chronic   . UTI (lower urinary tract infection)   . H/O: hysterectomy   . Systolic and diastolic CHF, chronic     45%  . Female bladder prolapse     with prolapse uterus, cystocele, s/p TVH/BSO  . Obesity (BMI 30-39.9)     wt. 94.1 kg 12/2010    Past Surgical History  Procedure Date  . Wrist surgery   . Vaginal hysterectomy,cystocele and prolapse  repair NOV. 2008    ROS:  Aches and pains, sinus trouble.  Otherwise as stated in the HPI and negative for all other systems.  PHYSICAL EXAM There were no vitals taken for this visit. PHYSICAL EXAM GEN:  No distress NECK:  No jugular venous distention at 90 degrees, waveform within normal limits, carotid upstroke brisk and symmetric, no bruits, no thyromegaly, conjunctiva injected LYMPHATICS:  No cervical adenopathy LUNGS:  Clear to auscultation bilaterally BACK:  No CVA tenderness CHEST:  Unremarkable HEART:  S1 and S2 within normal limits, no S3, no S4, no clicks, no rubs, no murmurs  ABD:  Positive bowel sounds normal in frequency in pitch, no bruits, no rebound, no guarding, unable to assess midline mass or bruit with the patient seated. EXT:  2 plus pulses throughout, mild edema, no cyanosis no clubbing SKIN:  No rashes no nodules NEURO:  Cranial nerves II through XII grossly intact, motor grossly intact throughout PSYCH:  Cognitively intact, oriented to person place and time  ASSESSMENT AND PLAN

## 2011-03-04 NOTE — Assessment & Plan Note (Signed)
Her blood pressure will be managed in the context of treating her cardiomyopathy. 

## 2011-04-07 ENCOUNTER — Ambulatory Visit: Payer: PRIVATE HEALTH INSURANCE | Admitting: Physician Assistant

## 2011-05-19 ENCOUNTER — Other Ambulatory Visit (INDEPENDENT_AMBULATORY_CARE_PROVIDER_SITE_OTHER): Payer: PRIVATE HEALTH INSURANCE

## 2011-05-19 ENCOUNTER — Other Ambulatory Visit: Payer: Self-pay | Admitting: *Deleted

## 2011-05-19 DIAGNOSIS — I10 Essential (primary) hypertension: Secondary | ICD-10-CM

## 2011-05-19 DIAGNOSIS — I5020 Unspecified systolic (congestive) heart failure: Secondary | ICD-10-CM

## 2011-05-19 LAB — BASIC METABOLIC PANEL
Calcium: 9.3 mg/dL (ref 8.4–10.5)
GFR: 96.95 mL/min (ref >60.00)
Glucose, Bld: 68 mg/dL — ABNORMAL LOW (ref 70–99)
Potassium: 3.6 mEq/L (ref 3.5–5.1)
Sodium: 141 mEq/L (ref 135–145)

## 2011-07-19 ENCOUNTER — Encounter (HOSPITAL_COMMUNITY): Payer: Self-pay

## 2011-07-19 ENCOUNTER — Emergency Department (HOSPITAL_COMMUNITY): Payer: Medicare Other

## 2011-07-19 ENCOUNTER — Inpatient Hospital Stay (HOSPITAL_COMMUNITY)
Admission: EM | Admit: 2011-07-19 | Discharge: 2011-07-22 | DRG: 292 | Disposition: A | Discharge status: Home / Self Care | Payer: Medicare Other | Source: Ambulatory Visit | Attending: Cardiology | Admitting: Cardiology

## 2011-07-19 ENCOUNTER — Telehealth: Payer: Self-pay | Admitting: Cardiology

## 2011-07-19 DIAGNOSIS — I059 Rheumatic mitral valve disease, unspecified: Secondary | ICD-10-CM | POA: Diagnosis present

## 2011-07-19 DIAGNOSIS — F17200 Nicotine dependence, unspecified, uncomplicated: Secondary | ICD-10-CM | POA: Diagnosis present

## 2011-07-19 DIAGNOSIS — Z6841 Body Mass Index (BMI) 40.0 and over, adult: Secondary | ICD-10-CM

## 2011-07-19 DIAGNOSIS — N814 Uterovaginal prolapse, unspecified: Secondary | ICD-10-CM | POA: Diagnosis present

## 2011-07-19 DIAGNOSIS — M1990 Unspecified osteoarthritis, unspecified site: Secondary | ICD-10-CM | POA: Diagnosis present

## 2011-07-19 DIAGNOSIS — Z79899 Other long term (current) drug therapy: Secondary | ICD-10-CM

## 2011-07-19 DIAGNOSIS — I509 Heart failure, unspecified: Secondary | ICD-10-CM | POA: Diagnosis present

## 2011-07-19 DIAGNOSIS — K5909 Other constipation: Secondary | ICD-10-CM | POA: Diagnosis present

## 2011-07-19 DIAGNOSIS — I5043 Acute on chronic combined systolic (congestive) and diastolic (congestive) heart failure: Secondary | ICD-10-CM | POA: Diagnosis present

## 2011-07-19 DIAGNOSIS — Z7982 Long term (current) use of aspirin: Secondary | ICD-10-CM

## 2011-07-19 DIAGNOSIS — I10 Essential (primary) hypertension: Secondary | ICD-10-CM | POA: Diagnosis present

## 2011-07-19 HISTORY — DX: Nonrheumatic mitral (valve) insufficiency: I34.0

## 2011-07-19 LAB — CBC
HCT: 39.4 % (ref 36.0–46.0)
Hemoglobin: 12.6 g/dL (ref 12.0–15.0)
RBC: 4.25 MIL/uL (ref 3.87–5.11)
WBC: 5.5 10*3/uL (ref 4.0–10.5)

## 2011-07-19 LAB — URINALYSIS, ROUTINE W REFLEX MICROSCOPIC
Glucose, UA: NEGATIVE mg/dL
Ketones, ur: NEGATIVE mg/dL
Leukocytes, UA: NEGATIVE
Nitrite: NEGATIVE
Specific Gravity, Urine: 1.009 (ref 1.005–1.030)
pH: 7.5 (ref 5.0–8.0)

## 2011-07-19 LAB — COMPREHENSIVE METABOLIC PANEL
AST: 16 U/L (ref 0–37)
Albumin: 3.8 g/dL (ref 3.5–5.2)
Alkaline Phosphatase: 176 U/L — ABNORMAL HIGH (ref 39–117)
BUN: 8 mg/dL (ref 6–23)
CO2: 26 mEq/L (ref 19–32)
Chloride: 101 mEq/L (ref 96–112)
Creatinine, Ser: 0.77 mg/dL (ref 0.50–1.10)
GFR calc non Af Amer: 81 mL/min — ABNORMAL LOW (ref >90)
Potassium: 3.6 mEq/L (ref 3.5–5.1)
Total Bilirubin: 0.3 mg/dL (ref 0.3–1.2)

## 2011-07-19 LAB — DIFFERENTIAL
Lymphocytes Relative: 28 % (ref 12–46)
Lymphs Abs: 1.5 10*3/uL (ref 0.7–4.0)
Monocytes Absolute: 0.4 10*3/uL (ref 0.1–1.0)
Monocytes Relative: 7 % (ref 3–12)
Neutro Abs: 3.3 10*3/uL (ref 1.7–7.7)
Neutrophils Relative %: 61 % (ref 43–77)

## 2011-07-19 LAB — CARDIAC PANEL(CRET KIN+CKTOT+MB+TROPI)
CK, MB: 2.4 ng/mL (ref 0.3–4.0)
Relative Index: 2.7 — ABNORMAL HIGH (ref 0.0–2.5)
Total CK: 94 U/L (ref 7–177)
Total CK: 104 U/L (ref 7–177)
Troponin I: <0.3 ng/mL (ref <0.30)

## 2011-07-19 LAB — PROTIME-INR
INR: 0.95 (ref 0.00–1.49)
Prothrombin Time: 12.9 seconds (ref 11.6–15.2)

## 2011-07-19 LAB — PRO B NATRIURETIC PEPTIDE: Pro B Natriuretic peptide (BNP): 1090 pg/mL — ABNORMAL HIGH (ref 0–125)

## 2011-07-19 LAB — MRSA PCR SCREENING: MRSA by PCR: NEGATIVE

## 2011-07-19 MED ORDER — ACETAMINOPHEN 325 MG PO TABS
650.0000 mg | ORAL_TABLET | ORAL | Status: DC | PRN
  Administered 2011-07-19 – 2011-07-22 (×7): 650 mg via ORAL
  Filled 2011-07-19 (×5): qty 2
  Filled 2011-07-19: qty 4
  Filled 2011-07-19: qty 2

## 2011-07-19 MED ORDER — ASPIRIN EC 81 MG PO TBEC
81.0000 mg | DELAYED_RELEASE_TABLET | Freq: Every day | ORAL | Status: DC
  Administered 2011-07-20 – 2011-07-22 (×3): 81 mg via ORAL
  Filled 2011-07-19 (×3): qty 1

## 2011-07-19 MED ORDER — FUROSEMIDE 10 MG/ML IJ SOLN
40.0000 mg | Freq: Once | INTRAMUSCULAR | Status: AC
  Administered 2011-07-19: 40 mg via INTRAVENOUS
  Filled 2011-07-19: qty 4

## 2011-07-19 MED ORDER — COLCHICINE 0.6 MG PO TABS
0.6000 mg | ORAL_TABLET | Freq: Two times a day (BID) | ORAL | Status: DC
  Administered 2011-07-19 – 2011-07-22 (×6): 0.6 mg via ORAL
  Filled 2011-07-19 (×7): qty 1

## 2011-07-19 MED ORDER — FUROSEMIDE 10 MG/ML IJ SOLN
80.0000 mg | Freq: Two times a day (BID) | INTRAMUSCULAR | Status: DC
  Administered 2011-07-19 – 2011-07-21 (×4): 80 mg via INTRAVENOUS
  Filled 2011-07-19 (×6): qty 8

## 2011-07-19 MED ORDER — FERROUS FUMARATE 325 (106 FE) MG PO TABS
1.0000 | ORAL_TABLET | Freq: Two times a day (BID) | ORAL | Status: DC
  Administered 2011-07-19 – 2011-07-22 (×6): 106 mg via ORAL
  Filled 2011-07-19 (×8): qty 1

## 2011-07-19 MED ORDER — SODIUM CHLORIDE 0.9 % IJ SOLN
3.0000 mL | Freq: Two times a day (BID) | INTRAMUSCULAR | Status: DC
  Administered 2011-07-19 – 2011-07-22 (×6): 3 mL via INTRAVENOUS

## 2011-07-19 MED ORDER — SODIUM CHLORIDE 0.9 % IV SOLN
250.0000 mL | INTRAVENOUS | Status: DC | PRN
Start: 2011-07-19 — End: 2011-07-22

## 2011-07-19 MED ORDER — HEPARIN SODIUM (PORCINE) 5000 UNIT/ML IJ SOLN
5000.0000 [IU] | Freq: Three times a day (TID) | INTRAMUSCULAR | Status: DC
  Administered 2011-07-19 – 2011-07-22 (×9): 5000 [IU] via SUBCUTANEOUS
  Filled 2011-07-19 (×11): qty 1

## 2011-07-19 MED ORDER — HYDROCODONE-ACETAMINOPHEN 5-325 MG PO TABS
1.0000 | ORAL_TABLET | Freq: Once | ORAL | Status: AC
  Administered 2011-07-19: 1 via ORAL
  Filled 2011-07-19: qty 1

## 2011-07-19 MED ORDER — SALINE SPRAY 0.65 % NA SOLN
2.0000 | NASAL | Status: DC | PRN
  Filled 2011-07-19: qty 44

## 2011-07-19 MED ORDER — CARVEDILOL 3.125 MG PO TABS
3.1250 mg | ORAL_TABLET | Freq: Two times a day (BID) | ORAL | Status: DC
  Administered 2011-07-19 – 2011-07-22 (×7): 3.125 mg via ORAL
  Filled 2011-07-19 (×8): qty 1

## 2011-07-19 MED ORDER — BISACODYL 5 MG PO TBEC
5.0000 mg | DELAYED_RELEASE_TABLET | Freq: Every day | ORAL | Status: DC
  Administered 2011-07-20 – 2011-07-21 (×2): 5 mg via ORAL
  Filled 2011-07-19 (×2): qty 1

## 2011-07-19 MED ORDER — NITROGLYCERIN 0.4 MG SL SUBL: 0.4000 mg | SUBLINGUAL_TABLET | SUBLINGUAL | Status: DC | PRN

## 2011-07-19 MED ORDER — ONDANSETRON HCL 4 MG/2ML IJ SOLN: 4.0000 mg | Freq: Four times a day (QID) | INTRAMUSCULAR | Status: DC | PRN

## 2011-07-19 MED ORDER — AMLODIPINE BESYLATE 10 MG PO TABS
10.0000 mg | ORAL_TABLET | Freq: Every day | ORAL | Status: DC
  Administered 2011-07-20 – 2011-07-22 (×3): 10 mg via ORAL
  Filled 2011-07-19 (×3): qty 1

## 2011-07-19 MED ORDER — TRAMADOL HCL 50 MG PO TABS
50.0000 mg | ORAL_TABLET | Freq: Three times a day (TID) | ORAL | Status: DC | PRN
  Administered 2011-07-19 – 2011-07-22 (×8): 50 mg via ORAL
  Filled 2011-07-19 (×7): qty 1

## 2011-07-19 MED ORDER — POTASSIUM CHLORIDE CRYS ER 20 MEQ PO TBCR
20.0000 meq | EXTENDED_RELEASE_TABLET | Freq: Two times a day (BID) | ORAL | Status: DC
  Administered 2011-07-19: 20 meq via ORAL
  Filled 2011-07-19 (×3): qty 1

## 2011-07-19 MED ORDER — SODIUM CHLORIDE 0.9 % IJ SOLN: 3.0000 mL | INTRAMUSCULAR | Status: DC | PRN

## 2011-07-19 NOTE — ED Notes (Signed)
Generalized pain all over along with bilateral swelling of her feet, abdominal pain, sob. These are symptoms that are chronic.

## 2011-07-19 NOTE — Telephone Encounter (Signed)
New msg Pt's daughter called and said she has retained a lot of fluid. Please call

## 2011-07-19 NOTE — H&P (Signed)
Patient ID: [name] MRN: [id number], DOB/AGE: 1934-12-13   Admit date: 07/19/2011   Primary Physician: Astrid Divine, MD, MD Primary Cardiologist: J. Hailie Searight, MD  Pt. Profile:  77 y/o female w/ h/o Mixed systolic/diastolic chf who presents with a 1 wk h/o worsening dyspnea, edema, and intermittent chest pain/tenderness.  Problem List  Past Medical History  Diagnosis Date  . HTN (hypertension)   . Diverticulosis   . Hemorrhoid   . Osteoarthritis   . Constipation, chronic   . UTI (lower urinary tract infection)   . H/O: hysterectomy   . Systolic and diastolic CHF, chronic     a. 01/2009 Echo:  EF 40-45%, Gr 1 DD, Mod-Sev MR , Mod TR, PASP  . Female bladder prolapse     with prolapse uterus, cystocele, s/p TVH/BSO 12/2006  . Obesity (BMI 30-39.9)     wt. 94.1 kg 12/2010  . Moderate mitral regurgitation     a. Mod to Sev by echo 01/2009    Past Surgical History  Procedure Date  . Wrist surgery   . Vaginal hysterectomy,cystocele and prolapse  repair NOV. 2008    Allergies  Allergies  Allergen Reactions  . Latex   . Penicillins   . Tetanus-Diphtheria Toxoids Td    HPI  77 y/o female with the above complex problem list.  She lives by herself in [place] (though dtr lives nearby) and is very sedentary @ home.  Daily activities consist of moving with walker from bed to couch to bathroom and back.  Her dtr prepares her meals for her and pt experiences DOE with any type of household chores such as vacuuming.  This is chronic.  Over the past week, pt has noted progressively more DOE along with bilat LEE.  She denies pnd, orthopnea, or early satiety.  She never wears any clothing containing a waistband so she can't say for sure if her abdomen feels fuller than usual.  She has also noted intermittent left upper chest and left shoulder pain, usually occurring when she lies down for one of her multiple naps or for bed at night.  This is somewhat worsened by  positioning and palpation and improved by lying still, or by taking ultram and vicodin.  Because of DOE, edema, and chest pain, she presented to the ED today.  Here, her BNP is mildly elevated @ 1090.  Initial CE are negative though ECG shows loss of R wave progression and an IVCD.  She has received IV lasix here in the ED and is currently comfortable.  Home Medications  Prior to Admission medications   Medication Sig Start Date End Date Taking? Authorizing Provider  amLODipine (NORVASC) 10 MG tablet Take 1 tablet (10 mg total) by mouth daily. 01/12/11 01/12/12 Yes Altha Harm, MD  aspirin 81 MG tablet Take 81 mg by mouth daily.     Yes Historical Provider, MD  bisacodyl (DULCOLAX) 5 MG EC tablet Take 5 mg by mouth daily. For   Yes Historical Provider, MD  carvedilol (COREG) 3.125 MG tablet Take 1 tablet (3.125 mg total) by mouth 2 (two) times daily with a meal. 03/04/11  Yes Rollene Rotunda, MD  colchicine 0.6 MG tablet Take 1 tablet (0.6 mg total) by mouth 2 (two) times daily. 01/12/11 01/12/12 Yes Altha Harm, MD  ferrous fumarate (FERRETTS) 325 (106 FE) MG TABS Take 1 tablet by mouth 2 (two) times daily.    Yes Historical Provider, MD  potassium chloride SA (K-DUR,KLOR-CON) 20  MEQ tablet Take 20 mEq by mouth 2 (two) times daily.   Yes Historical Provider, MD  sodium chloride (OCEAN) 0.65 % SOLN nasal spray Place 2 sprays into the nose as needed for congestion. 01/12/11  Yes Altha Harm, MD  traMADol (ULTRAM) 50 MG tablet Take 50 mg by mouth every 8 (eight) hours as needed. For pain   Yes Historical Provider, MD   Family History  Family History  Problem Relation Age of Onset  . Diabetes Brother   . Hypertension Mother     deceased @ 40  . Leukemia Brother   . Cancer Brother   . Stroke Mother   . Diabetes Father     deceased in his 17's   Social History  History   Social History  . Marital Status: Single    Spouse Name: N/A    Number of Children: N/A  . Years  of Education: N/A   Occupational History  . retired Futures trader    Social History Main Topics  . Smoking status: Never Smoker   . Smokeless tobacco: Current User   Comment: Uses Snuff regularly.  Dips several x/day.  A "large can" can last her up to 3 mos.  . Alcohol Use: No  . Drug Use: No  . Sexually Active: Not Currently   Other Topics Concern  . Not on file   Social History Narrative   Lives in [place] by herself.  Her dtr lives near by and prepares all of her meals and leaves them for the pt to heat up in the microwave.  Dtr pays close attn to salt in meals.  Pt is sedentary.  Most activity is moving from couch to bed.  She ambulates w/ a walker.  Wt is up from 217 in January to 244 now.    Review of Systems General:  No chills, fever, night sweats or weight changes.  Cardiovascular:  +++ chest pain and soreness as outlined above.  +++ dyspnea on exertion & edema.  No orthopnea, palpitations, paroxysmal nocturnal dyspnea. Dermatological: No rash, lesions/masses Respiratory: No cough, dyspnea Urologic: No hematuria, dysuria.  Chronic bladder prolapse. Abdominal:   No nausea, vomiting, diarrhea, bright red blood per rectum, melena, or hematemesis Neurologic:  No visual changes, wkns, changes in mental status. All other systems reviewed and are otherwise negative except as noted above.  Physical Exam  Blood pressure 115/57, pulse 83, temperature 98 F (36.7 C), resp. rate 17, SpO2 96.00%.  General: Pleasant, NAD.  Obese. Psych: Normal affect. Neuro: Alert and oriented X 3. Moves all extremities spontaneously. HEENT: Normal  Neck: Supple without bruits.  JVP approx 12cm. Lungs:  Resp regular and unlabored, bibasilar crackles. Heart: RRR no s3, s4.  2/6 SM heard best @ base and apex. Abdomen: Soft, non-tender, non-distended, BS + x 4.  Tenderness noted upon palpation over left chest wall. Extremities: No clubbing, cyanosis.  Trace bilat LE edema. DP/PT/Radials 2+ and equal  bilaterally.  Labs  Basename 07/19/11 1315  CKTOTAL 94  CKMB 2.4  TROPONINI <0.30   Lab Results  Component Value Date   WBC 5.5 07/19/2011   HGB 12.6 07/19/2011   HCT 39.4 07/19/2011   MCV 92.7 07/19/2011   PLT 311 07/19/2011    Lab 07/19/11 1314  NA 137  K 3.6  CL 101  CO2 26  BUN 8  CREATININE 0.77  CALCIUM 9.3  PROT 7.5  BILITOT 0.3  ALKPHOS 176*  ALT 11  AST 16  GLUCOSE 83  Radiology/Studies  Dg Chest 2 View  07/19/2011  *RADIOLOGY REPORT*  Clinical Data: Shortness of breath and leg swelling.  CHEST - 2 VIEW  Comparison: CT chest 01/04/2011 and chest radiograph 01/03/2011.  Findings: Trachea is midline.  Heart is enlarged.  Linear scarring in the left perihilar region.  Lungs are otherwise clear.  No pleural fluid.  IMPRESSION: No acute findings.  Original Report Authenticated By: Reyes Ivan, M.D.   ECG  Rsr, 72, pvc.  IVCD (new), , poor R prog (new), TWI aVL (new)  ASSESSMENT AND PLAN  1.  Acute on Chronic mixed systolic and diastolic CHF:  Pt presents with progressive dyspnea and subjective edema.  She has mild volume overload on exam.  Will admit for gentle diuresis.  Repeat echo as it's been roughly 3 yrs.  HR/BP reasonably controlled.  Continue home meds.  2.  Left chest and shoulder pain:  No prior h/o CAD.  No apparent prior w/u noted.  Pain is atypical and reproducible on palpation.  Will cycle CE.  Hold off on heparin for now.  Cont asa/bb.  Check lipids and consider statin.  3.  Mod-Sev MR:  Noted on last echo.  Repeat this admission.  Poor surgical candidate.  4.  Tob Abuse:  Uses snuff.  Will obtain cessation counseling.  5.  HTN:  Stable.  6. Bladder prolapse:  This has been an ongoing issue.  She has previously not been felt to be a good surgical candidate.   Signed, Nicolasa Ducking, NP 07/19/2011, 3:32 PM  History and all data above reviewed.  Patient examined.  I agree with the findings as above.  She has had slowly increased  extremity edema and weight increase.  She also complains of diffuse muscle aches.  She has some chest pain.  However, this has atypical greater than typical features.  Her biggest complaints seems to be related to her uterine prolapse.The patient exam reveals COR: RRR  ,  Lungs: Clear  ,  Abd: Obese, Ext Mild edema  .  All available labs, radiology testing, previous records reviewed. Agree with documented assessment and plan. Edema:  Gentle diuresis over the next 24 to 48 hours.  Chest pain:  Conservative therapy.  Cycle enzymes.  MR:  She would be a poor surgical candidate so I am following this.  We will repeat an echo.  Uterine prolapse:  I will arrange an out patient consult.  She would be high risk for surgery with her weight and comorbidities.  However, it has become a quality of life issue.  Fayrene Fearing Unnamed Hino  4:26 PM  07/19/2011

## 2011-07-19 NOTE — Progress Notes (Addendum)
Pt admitted to 4739 from ED. Pt oriented to unit. Pt placed on tele, wt taken, VSS. Pt has foley in place, placed by the ED. "Living better with heart failure" packet given to pt. Call bell within reach. Will continue to monitor.

## 2011-07-19 NOTE — ED Notes (Signed)
Gave old and new ECG to Dr. Tanna Savoy after I performed. 2:15pm JG.

## 2011-07-19 NOTE — Telephone Encounter (Signed)
Pt daughter notes that mother is very short of breath at this time and is swollen all over. Both feet & legs and abdomen are swollen.  Pt is also having "pain all over with this swelling". Wondering if she should go ahead and take mom to the hospital.  Weight today is 244. Pt weight in January OV was 217.  Dr. Antoine Poche reviewed and agrees with daughter pt should go to the hospital. Trish aware pt going to ED. Daughter will be bringing her as mother does not want to go by ambulance. Mylo Red RN

## 2011-07-19 NOTE — ED Notes (Signed)
Pt states her daughter made her seek medical attn "because of all this swelling." Pt states, "I just don't feel good."

## 2011-07-19 NOTE — ED Provider Notes (Addendum)
History     CSN: [id number]  Arrival date & time 07/19/11  1144   First MD Initiated Contact with Patient 07/19/11 1301      Chief Complaint  Patient presents with  . Muscle Pain    (Consider location/radiation/quality/duration/timing/severity/associated sxs/prior treatment) Patient is a 77 y.o. female presenting with shortness of breath. The history is provided by the patient and a caregiver.  Shortness of Breath  The current episode started 5 to 7 days ago. The onset was gradual. The problem occurs continuously. The problem has been gradually worsening. The problem is moderate. The symptoms are relieved by rest. The symptoms are aggravated by activity. Associated symptoms include chest pain, chest pressure and shortness of breath. Pertinent negatives include no cough and no wheezing.    Past Medical History  Diagnosis Date  . HTN (hypertension)   . Diverticulosis   . Hemorrhoid   . Osteoarthritis   . Constipation, chronic   . UTI (lower urinary tract infection)   . H/O: hysterectomy   . Systolic and diastolic CHF, chronic     45%  . Female bladder prolapse     with prolapse uterus, cystocele, s/p TVH/BSO  . Obesity (BMI 30-39.9)     wt. 94.1 kg 12/2010    Past Surgical History  Procedure Date  . Wrist surgery   . Vaginal hysterectomy,cystocele and prolapse  repair NOV. 2008    Family History  Problem Relation Age of Onset  . Diabetes Brother   . Hypertension Mother   . Leukemia Brother   . Cancer Brother   . Stroke Mother   . Diabetes Father     History  Substance Use Topics  . Smoking status: Never Smoker   . Smokeless tobacco: Not on file  . Alcohol Use: No    OB History    Grav Para Term Preterm Abortions TAB SAB Ect Mult Living   6               Review of Systems  Constitutional: Negative for diaphoresis and fatigue.  Respiratory: Positive for shortness of breath. Negative for cough and wheezing.   Cardiovascular: Positive for chest pain and  leg swelling.  Gastrointestinal: Negative for nausea, vomiting, abdominal pain and diarrhea.  All other systems reviewed and are negative.    Allergies  Latex; Penicillins; and Tetanus-diphtheria toxoids td  Home Medications   Current Outpatient Rx  Name Route Sig Dispense Refill  . AMLODIPINE BESYLATE 10 MG PO TABS Oral Take 1 tablet (10 mg total) by mouth daily. 30 tablet 0  . ASPIRIN 81 MG PO TABS Oral Take 81 mg by mouth daily.      Marland Kitchen BISACODYL 5 MG PO TBEC Oral Take 5 mg by mouth daily. For    . CARVEDILOL 3.125 MG PO TABS Oral Take 1 tablet (3.125 mg total) by mouth 2 (two) times daily with a meal. 60 tablet 11  . COLCHICINE 0.6 MG PO TABS Oral Take 1 tablet (0.6 mg total) by mouth 2 (two) times daily. 60 tablet 0  . FERROUS FUMARATE 325 (106 FE) MG PO TABS Oral Take 1 tablet by mouth 2 (two) times daily.     Marland Kitchen POTASSIUM CHLORIDE CRYS ER 20 MEQ PO TBCR Oral Take 20 mEq by mouth 2 (two) times daily.    Marland Kitchen SALINE 0.65 % NA SOLN Nasal Place 2 sprays into the nose as needed for congestion. 15 mL 0  . TRAMADOL HCL 50 MG PO TABS Oral Take 50  mg by mouth every 8 (eight) hours as needed. For pain      BP 115/57  Pulse 83  Temp 98 F (36.7 C)  Resp 17  SpO2 96%  Physical Exam  Nursing note and vitals reviewed. Constitutional: She is oriented to person, place, and time. She appears well-developed and well-nourished. No distress.  HENT:  Head: Normocephalic and atraumatic.  Mouth/Throat: Oropharynx is clear and moist.  Eyes: Conjunctivae and EOM are normal. Pupils are equal, round, and reactive to light.  Neck: Normal range of motion. Neck supple.  Cardiovascular: Normal rate, regular rhythm and intact distal pulses.   No murmur heard. Pulmonary/Chest: Effort normal. No respiratory distress. She has no wheezes. She has no rhonchi. She has rales in the right lower field and the left lower field.  Abdominal: Soft. She exhibits no distension. There is no tenderness. There is no  rebound and no guarding.  Musculoskeletal: Normal range of motion. She exhibits edema. She exhibits no tenderness.       Mild edema in the lower ext.  Difficult to quantify in the legs and abdomen  Neurological: She is alert and oriented to person, place, and time.  Skin: Skin is warm and dry. No rash noted. No erythema.  Psychiatric: She has a normal mood and affect. Her behavior is normal.    ED Course  Procedures (including critical care time)  Labs Reviewed  COMPREHENSIVE METABOLIC PANEL - Abnormal; Notable for the following:    Alkaline Phosphatase 176 (*)    GFR calc non Af Amer 81 (*)    All other components within normal limits  PRO B NATRIURETIC PEPTIDE - Abnormal; Notable for the following:    Pro B Natriuretic peptide (BNP) 1090.0 (*)    All other components within normal limits  CBC  DIFFERENTIAL  CARDIAC PANEL(CRET KIN+CKTOT+MB+TROPI)  URINALYSIS, ROUTINE W REFLEX MICROSCOPIC   Dg Chest 2 View  07/19/2011  *RADIOLOGY REPORT*  Clinical Data: Shortness of breath and leg swelling.  CHEST - 2 VIEW  Comparison: CT chest 01/04/2011 and chest radiograph 01/03/2011.  Findings: Trachea is midline.  Heart is enlarged.  Linear scarring in the left perihilar region.  Lungs are otherwise clear.  No pleural fluid.  IMPRESSION: No acute findings.  Original Report Authenticated By: Reyes Ivan, M.D.    Date: 07/19/2011  Rate: 72  Rhythm: normal sinus rhythm PVC's  QRS Axis: normal  Intervals: normal  ST/T Wave abnormalities: nonspecific ST/T changes, t-wave inversion in lateral leads.  Conduction Disutrbances:nonspecific intraventricular conduction delay  Narrative Interpretation: LVH  Old EKG Reviewed:  New t-wave inversion    1. CHF (congestive heart failure)       MDM    patient with a history of CHF who presents to do to worsening swelling in the legs and sort shortness of breath with intermittent chest pain. Patient has gained 30 pounds since January which they  state is all fluid. She has generalized pain which is not unusual for her and she states today that it is time for her pain pill. She denies any fever, cough or localized abdominal pain. She states is low she continues to take her Lasix 40 mg as she is urinating as normal. Patient is not currently having chest pain for which she does have chest pain it occurs more with exertion is within the left side of her chest. Concern for CHF exacerbation today based on weight gain, lower extremity edema and shortness of breath. CBC, CMP, cardiac enzymes, BNP,  UA, chest x-ray, EKG pending.   2:27 PM BMP is elevated 10,000 today which is improved from August when she was here however based on her 30 pound weight gain and intermittent chest pain Will have cardiology come to evaluate the patient.  Cardiac enzymes, CMP, chest x-ray are within normal limits.     Gwyneth Sprout, MD 07/19/11 1427  Gwyneth Sprout, MD 07/19/11 1191  Gwyneth Sprout, MD 07/19/11 1534

## 2011-07-20 DIAGNOSIS — I059 Rheumatic mitral valve disease, unspecified: Secondary | ICD-10-CM

## 2011-07-20 LAB — BASIC METABOLIC PANEL
Chloride: 100 mEq/L (ref 96–112)
Creatinine, Ser: 0.81 mg/dL (ref 0.50–1.10)
GFR calc Af Amer: 81 mL/min — ABNORMAL LOW (ref >90)
GFR calc non Af Amer: 70 mL/min — ABNORMAL LOW (ref >90)
Potassium: 3 mEq/L — ABNORMAL LOW (ref 3.5–5.1)

## 2011-07-20 LAB — CARDIAC PANEL(CRET KIN+CKTOT+MB+TROPI)
CK, MB: 2 ng/mL (ref 0.3–4.0)
Total CK: 174 U/L (ref 7–177)
Troponin I: <0.3 ng/mL (ref <0.30)
Troponin I: <0.3 ng/mL (ref <0.30)

## 2011-07-20 LAB — LIPID PANEL
LDL Cholesterol: 140 mg/dL — ABNORMAL HIGH (ref 0–99)
VLDL: 17 mg/dL (ref 0–40)

## 2011-07-20 MED ORDER — POTASSIUM CHLORIDE CRYS ER 20 MEQ PO TBCR
40.0000 meq | EXTENDED_RELEASE_TABLET | Freq: Two times a day (BID) | ORAL | Status: DC
  Administered 2011-07-20 – 2011-07-22 (×5): 40 meq via ORAL
  Filled 2011-07-20 (×5): qty 2

## 2011-07-20 NOTE — Plan of Care (Signed)
Problem: Food- and Nutrition-Related Knowledge Deficit (NB-1.1) Goal: Nutrition education Formal process to instruct or train a patient/client in a skill or to impart knowledge to help patients/clients voluntarily manage or modify food choices and eating behavior to maintain or improve health.  Outcome: Progressing Consult received for education r/t CHF management.  Pt reports that she lives at home alone and her daughter prepares all her meals for her.  Her daughter is not present during visit with RD. Pt feels her daughter is aware of her medical conditions and prepares appropriate meals for her.  She reports neither she or her daughter add sodium/salt to meals which is very different from what she has done in the past.  Pt reports eating less processed foods.  Pt is struggling with fluid restriction.  She believes she is supposed to be on a fluid restriction at home, but is not sure, and has not been monitoring fluid intake.  Pt believes that because she chooses water, it is ok to drink ad lib.  Instructed pt that overall fluid intake is just as important as her choice of beverages.  Pt c/o dry mouth.  Discussed ways to manage this at home especially if fluid restriction is to continue.  Also encouraged pt to weigh daily.  Pt states that she has a functional scale at home, but sometimes forgets about it.  She plans to make an effort to weigh more often. Pt verbalizes understanding of information presented.  Expect good compliance.  Encouraged pt to contact RD via nursing staff if questions present and a return visit is needed.  Hoyt Koch Pager: [phone]

## 2011-07-20 NOTE — Progress Notes (Signed)
Utilization Review Completed.Esiah Bazinet T6/12/2011   

## 2011-07-20 NOTE — Progress Notes (Signed)
Cosign for Levell July RN assessment, med admin, and notes until [phone]

## 2011-07-20 NOTE — Progress Notes (Signed)
SUBJECTIVE:  I hurt all over.  No dyspnea.  Diffuse bony pain which is chronic   PHYSICAL EXAM Filed Vitals:   07/19/11 1759 07/19/11 2050 07/20/11 0250 07/20/11 0540  BP: 157/97 133/67 152/78 152/88  Pulse: 69 79 72 90  Temp: 98.2 F (36.8 C) 98.8 F (37.1 C) 98.7 F (37.1 C) 98.2 F (36.8 C)  TempSrc: Oral Oral Oral Oral  Resp: 19 20 20 18   Height: 5\' 1"  (1.549 m)     Weight: 241 lb 14.4 oz (109.725 kg)   243 lb 2.7 oz (110.3 kg)  SpO2: 98% 98% 98% 100%   General:  No distress Lungs:  Clear Heart:  RRR Abdomen:  Positive bowel sounds, no rebound no guarding Extremities:  Decreased edema.  LABS: Lab Results  Component Value Date   CKTOTAL 162 07/20/2011   CKMB 2.1 07/20/2011   TROPONINI <0.30 07/20/2011   Results for orders placed during the hospital encounter of 07/19/11 (from the past 24 hour(s))  CBC     Status: Normal   Collection Time   07/19/11  1:14 PM      Component Value Range   WBC 5.5  4.0 - 10.5 (K/uL)   RBC 4.25  3.87 - 5.11 (MIL/uL)   Hemoglobin 12.6  12.0 - 15.0 (g/dL)   HCT 45.4  09.8 - 11.9 (%)   MCV 92.7  78.0 - 100.0 (fL)   MCH 29.6  26.0 - 34.0 (pg)   MCHC 32.0  30.0 - 36.0 (g/dL)   RDW 14.7  82.9 - 56.2 (%)   Platelets 311  150 - 400 (K/uL)  DIFFERENTIAL     Status: Normal   Collection Time   07/19/11  1:14 PM      Component Value Range   Neutrophils Relative 61  43 - 77 (%)   Neutro Abs 3.3  1.7 - 7.7 (K/uL)   Lymphocytes Relative 28  12 - 46 (%)   Lymphs Abs 1.5  0.7 - 4.0 (K/uL)   Monocytes Relative 7  3 - 12 (%)   Monocytes Absolute 0.4  0.1 - 1.0 (K/uL)   Eosinophils Relative 4  0 - 5 (%)   Eosinophils Absolute 0.2  0.0 - 0.7 (K/uL)   Basophils Relative 1  0 - 1 (%)   Basophils Absolute 0.0  0.0 - 0.1 (K/uL)  COMPREHENSIVE METABOLIC PANEL     Status: Abnormal   Collection Time   07/19/11  1:14 PM      Component Value Range   Sodium 137  135 - 145 (mEq/L)   Potassium 3.6  3.5 - 5.1 (mEq/L)   Chloride 101  96 - 112 (mEq/L)   CO2 26  19 - 32 (mEq/L)   Glucose, Bld 83  70 - 99 (mg/dL)   BUN 8  6 - 23 (mg/dL)   Creatinine, Ser 1.30  0.50 - 1.10 (mg/dL)   Calcium 9.3  8.4 - 86.5 (mg/dL)   Total Protein 7.5  6.0 - 8.3 (g/dL)   Albumin 3.8  3.5 - 5.2 (g/dL)   AST 16  0 - 37 (U/L)   ALT 11  0 - 35 (U/L)   Alkaline Phosphatase 176 (*) 39 - 117 (U/L)   Total Bilirubin 0.3  0.3 - 1.2 (mg/dL)   GFR calc non Af Amer 81 (*) >90 (mL/min)   GFR calc Af Amer >90  >90 (mL/min)  CARDIAC PANEL(CRET KIN+CKTOT+MB+TROPI)     Status: Normal   Collection Time  07/19/11  1:15 PM      Component Value Range   Total CK 94  7 - 177 (U/L)   CK, MB 2.4  0.3 - 4.0 (ng/mL)   Troponin I <0.30  <0.30 (ng/mL)   Relative Index RELATIVE INDEX IS INVALID  0.0 - 2.5   PRO B NATRIURETIC PEPTIDE     Status: Abnormal   Collection Time   07/19/11  1:15 PM      Component Value Range   Pro B Natriuretic peptide (BNP) 1090.0 (*) 0 - 125 (pg/mL)  URINALYSIS, ROUTINE W REFLEX MICROSCOPIC     Status: Normal   Collection Time   07/19/11  2:24 PM      Component Value Range   Color, Urine YELLOW  YELLOW    APPearance CLEAR  CLEAR    Specific Gravity, Urine 1.009  1.005 - 1.030    pH 7.5  5.0 - 8.0    Glucose, UA NEGATIVE  NEGATIVE (mg/dL)   Hgb urine dipstick NEGATIVE  NEGATIVE    Bilirubin Urine NEGATIVE  NEGATIVE    Ketones, ur NEGATIVE  NEGATIVE (mg/dL)   Protein, ur NEGATIVE  NEGATIVE (mg/dL)   Urobilinogen, UA 0.2  0.0 - 1.0 (mg/dL)   Nitrite NEGATIVE  NEGATIVE    Leukocytes, UA NEGATIVE  NEGATIVE   MRSA PCR SCREENING     Status: Normal   Collection Time   07/19/11  5:58 PM      Component Value Range   MRSA by PCR NEGATIVE  NEGATIVE   CARDIAC PANEL(CRET KIN+CKTOT+MB+TROPI)     Status: Abnormal   Collection Time   07/19/11  7:38 PM      Component Value Range   Total CK 104  7 - 177 (U/L)   CK, MB 2.8  0.3 - 4.0 (ng/mL)   Troponin I <0.30  <0.30 (ng/mL)   Relative Index 2.7 (*) 0.0 - 2.5   PROTIME-INR     Status: Normal   Collection  Time   07/19/11  7:39 PM      Component Value Range   Prothrombin Time 12.9  11.6 - 15.2 (seconds)   INR 0.95  0.00 - 1.49   TSH     Status: Normal   Collection Time   07/19/11  7:39 PM      Component Value Range   TSH 1.932  0.350 - 4.500 (uIU/mL)  CARDIAC PANEL(CRET KIN+CKTOT+MB+TROPI)     Status: Normal   Collection Time   07/20/11  1:45 AM      Component Value Range   Total CK 162  7 - 177 (U/L)   CK, MB 2.1  0.3 - 4.0 (ng/mL)   Troponin I <0.30  <0.30 (ng/mL)   Relative Index 1.3  0.0 - 2.5     Intake/Output Summary (Last 24 hours) at 07/20/11 0648 Last data filed at 07/20/11 0544  Gross per 24 hour  Intake    608 ml  Output   2500 ml  Net  -1892 ml     ASSESSMENT AND PLAN:  1. 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  Continue today with current IV therapy. Possible discharge in one to two days.    2. Left chest and shoulder pain::  Atypical.  No objective evidence of ischemia.  Ruled out.  No further work up for possible ischemia.  She has some chronic pain issues.    3. 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 Repeat this admission. Poor surgical  candidate. I am planning conservative management.    4. Tob Abuse: Uses snuff. Will obtain cessation counseling.   5. HTN: Stable.   6. Bladder prolapse:  I discussed with the patient and the family that this will require an out patient consult.     Fayrene Fearing [facility] 07/20/2011 6:48 AM

## 2011-07-20 NOTE — Progress Notes (Signed)
Spoke with [facility] PA about pt's foley. No order for foley. Okay to d/c foley, continue with strict I&O's. Will continue to monitor.

## 2011-07-20 NOTE — Progress Notes (Addendum)
Dr. notified regarding patients potassium level 3.0 New orders placed. Will continue to monitor.

## 2011-07-21 DIAGNOSIS — I509 Heart failure, unspecified: Secondary | ICD-10-CM

## 2011-07-21 MED ORDER — FUROSEMIDE 80 MG PO TABS
80.0000 mg | ORAL_TABLET | Freq: Two times a day (BID) | ORAL | Status: DC
  Administered 2011-07-21 – 2011-07-22 (×3): 80 mg via ORAL
  Filled 2011-07-21 (×5): qty 1

## 2011-07-21 NOTE — Progress Notes (Signed)
   SUBJECTIVE:  I hurt all over. Mild dyspnea.  Diffuse bony pain which is chronic   PHYSICAL EXAM Filed Vitals:   07/20/11 0540 07/20/11 1518 07/20/11 2236 07/21/11 0503  BP: 152/88 127/66 135/68 138/69  Pulse: 90 76 85 72  Temp: 98.2 F (36.8 C) 97.7 F (36.5 C) 98.4 F (36.9 C) 97.9 F (36.6 C)  TempSrc: Oral Oral Oral Oral  Resp: 18 18 17 18   Height:      Weight: 243 lb 2.7 oz (110.3 kg)   241 lb 8 oz (109.544 kg)  SpO2: 100% 100% 95% 96%   General:  No distress Lungs:  Clear but few bibasilar crackles.  Heart:  RRR Abdomen:  Positive bowel sounds, no rebound no guarding Extremities:  Decreased edema.  LABS: Lab Results  Component Value Date   CKTOTAL 174 07/20/2011   CKMB 2.0 07/20/2011   TROPONINI <0.30 07/20/2011   No results found for this or any previous visit (from the past 24 hour(s)).  Intake/Output Summary (Last 24 hours) at 07/21/11 0827 Last data filed at 07/20/11 2346  Gross per 24 hour  Intake    600 ml  Output   2225 ml  Net  -1625 ml     ASSESSMENT AND PLAN:  1. 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  Switch lasix to po. Check labs tomorrow. Possible discharge home tomorrow. Home dose of Lasix is 40 mg daily according to patient. She will need at least 40 mg bid for discharge.  2. Left chest and shoulder pain::  Atypical.  No objective evidence of ischemia.  Ruled out.  No further work up for possible ischemia.  She has some chronic pain issues.    3. 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 Repeat this admission. Poor surgical candidate.  conservative management per Dr. Antoine Poche.    4. Tob Abuse: Uses snuff. Will obtain cessation counseling.   5. HTN: Stable.   6. Bladder prolapse:  I discussed with the patient and the family that this will require an out patient consult.     [name] 07/21/2011 8:27 AM

## 2011-07-21 NOTE — Clinical Documentation Improvement (Signed)
BMI DOCUMENTATION CLARIFICATION QUERY  THIS DOCUMENT IS NOT A PERMANENT PART OF THE MEDICAL RECORD         07/21/11  Dr. [facility]ine Poche [facility]  In an effort to better capture your patient's severity of illness, reflect appropriate length of stay and utilization of resources, a review of the patient medical record has revealed the following indicators:  Height   5\' 1"   Weight   241 lbs   14.4 ozs   on admission                 243 lbs   2.7 ozs                 241 lbs   8.0 ozs  BMI   45.8 on admission    Based on your clinical judgment, please document in the progress notes and discharge summary if a condition below provides greater specificity regarding the patient's height and weight:    - Morbid Obesity, BMI 45.8   - Other Condition   - Unable to Clinically Determine   In responding to this query please exercise your independent judgment.  The fact that a query is asked, does not imply that any particular answer is desired or expected.    Reviewed: additional documentation in the medical record  Thank You,  Jerral Ralph  RN BSN CCDS Certified Clinical Documentation Specialist: Cell   [phone]  Health Information Management [place]    TO RESPOND TO THE THIS QUERY, FOLLOW THE INSTRUCTIONS BELOW:  1. If needed, update documentation for the patient's encounter via the notes activity.  2. Access this query again and click edit on the In [employer].  3. After updating, or not, click F2 to complete all highlighted (required) fields concerning your review. Select "additional documentation in the medical record" OR "no additional documentation provided".  4. Click Sign note button.  5. The deficiency will fall out of your In Basket *Please let us know if you are not able to complete this workflow by phone or e-mail (listed below).

## 2011-07-21 NOTE — Progress Notes (Signed)
Spoke with MD regarding Lasix dose this am because new order was put in for p.o. Lasix. Advised by MD to give IV Lasix now and start p.o. dose this evening. Will continue to monitor pt closely.  [name], RN

## 2011-07-22 DIAGNOSIS — I5043 Acute on chronic combined systolic (congestive) and diastolic (congestive) heart failure: Secondary | ICD-10-CM | POA: Diagnosis present

## 2011-07-22 LAB — BASIC METABOLIC PANEL
CO2: 28 mEq/L (ref 19–32)
Calcium: 9.3 mg/dL (ref 8.4–10.5)
Chloride: 101 mEq/L (ref 96–112)
GFR calc Af Amer: 80 mL/min — ABNORMAL LOW (ref >90)
Sodium: 140 mEq/L (ref 135–145)

## 2011-07-22 MED ORDER — FUROSEMIDE 80 MG PO TABS: 80.0000 mg | ORAL_TABLET | Freq: Two times a day (BID) | ORAL | Status: DC

## 2011-07-22 MED ORDER — POTASSIUM CHLORIDE CRYS ER 20 MEQ PO TBCR: 40.0000 meq | EXTENDED_RELEASE_TABLET | Freq: Two times a day (BID) | ORAL | Status: DC

## 2011-07-22 MED FILL — Perflutren Lipid Microsphere IV Susp 6.52 MG/ML: INTRAVENOUS | Qty: 2 | Status: AC

## 2011-07-22 NOTE — Discharge Summary (Signed)
CARDIOLOGY DISCHARGE SUMMARY   Patient ID: [name] MRN: [id number] DOB/AGE: 1935/01/13 77 y.o.  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 HTN (hypertension)   . Diverticulosis   . Hemorrhoid   . Osteoarthritis   . Constipation, chronic   . UTI (lower urinary tract infection)   . H/O: hysterectomy   . Systolic and diastolic CHF, chronic     a. 01/2009 Echo:  EF 40-45%, Gr 1 DD, Mod-Sev MR , Mod TR, PASP  . Female bladder prolapse     with prolapse uterus, cystocele, s/p TVH/BSO 12/2006  . Obesity (BMI 30-39.9)     wt. 94.1 kg 12/2010  . Moderate mitral regurgitation     a. Mod to Sev by echo 01/2009;; mild by echo this admission   Procedures: Transthoracic Echocardiography  Hospital Course: Ms Lynam is a 77 year old female with a history of CHF. She had increased SOB and edema as well as some chest and shoulder pain. She came to the hospital where she was admitted for further evaluation and treatment.   Her cardiac enzymes were negative for MI. She had an echocardiogram which showed only mild MR and an EF of 40%. This is not significantly changed from her 2010 echo. Dr Kirke Corin and Dr Sw[place]and reviewed the data, felt her pain was atypical and no further cardiac workup was indicated. She had Tramadol on her home meds and this was continued for the pain.   She has problems with bladder prolapse and this should be followed up as an outpatient but she has previously not felt to be a surgical candidate (per old notes).    She was gently diuresed with close monitoring of her electrolytes and renal function. Her I/O were negative for > 3 liters during her hospital stay. Her potassium was low and was supplemented. Her home doses of Lasix (and potassium) were increased to help with outpatient volume.  By  07/22/2011, she was improved to the point that Dr Swaziland considered her stable for discharge, in improved condition, to follow up as an outpatient.   Labs:  Lab Results  Component Value Date   WBC 5.5 07/19/2011   HGB 12.6 07/19/2011   HCT 39.4 07/19/2011   MCV 92.7 07/19/2011   PLT 311 07/19/2011    Lab 07/22/11 0608 07/19/11 1314  NA 140 --  K 3.3* --  CL 101 --  CO2 28 --  BUN 19 --  CREATININE 0.82 --  CALCIUM 9.3 --  PROT -- 7.5  BILITOT -- 0.3  ALKPHOS -- 176*  ALT -- 11  AST -- 16  GLUCOSE 90 --    Basename 07/20/11 0723 07/20/11 0145 07/19/11 1938  CKTOTAL 174 162 104  CKMB 2.0 2.1 2.8  CKMBINDEX -- -- --  TROPONINI <0.30 <0.30 <0.30   Lipid Panel     Component Value Date/Time   CHOL 233* 07/20/2011 0723   TRIG 85 07/20/2011 0723   HDL 76 07/20/2011 0723   CHOLHDL 3.1 07/20/2011 0723   VLDL 17 07/20/2011 0723   LDLCALC 140* 07/20/2011 0723    Pro B Natriuretic peptide (BNP)  Date/Time Value Range Status  07/19/2011  1:15 PM 1090.0* 0 - 125 pg/mL Final  09/27/2010  5:29 PM 2937.0* 0 - 125 pg/mL Final    Basename 07/19/11 1939  INR 0.95      Radiology: Dg Chest 2 View 07/19/2011  *RADIOLOGY REPORT*  Clinical Data: Shortness of breath and leg swelling.  CHEST - 2 VIEW  Comparison: CT chest 01/04/2011 and chest radiograph 01/03/2011.  Findings: Trachea is midline.  Heart is enlarged.  Linear scarring in the left perihilar region.  Lungs are otherwise clear.  No pleural fluid.  IMPRESSION: No acute findings.  Original Report Authenticated By: Reyes Ivan, M.D.    EKG: 21-Jul-2011 09:81:19 [facility] ROUTINE RECORD Sinus rhythm with Fusion complexes Left axis deviation Left bundle branch block Abnormal ECG 38mm/s 27mm/mV 100Hz  8.0.1 12SL 239 CID: 1 Referred by: J HOCHREIN Unconfirmed Vent. rate 72 BPM PR interval 186 ms QRS duration 134 ms QT/QTc 414/453 ms P-R-T axes 57 -57 89  Echo: 07/20/2011 Study Conclusions - Left ventricle:  Technically limited study. Can not see endocardium in all views. Contrast was used and did help some.. There is hypokinesis of the inferior wall and the inferolateral wall and possibly the lateral wall. The EF is in the 40% range. The cavity size was mildly to moderately dilated. - Aortic valve: Sclerosis without stenosis. No significant regurgitation. - Mitral valve: Mild regurgitation. - Left atrium: The atrium was mildly dilated. - Right ventricle: The RV is poorly seen. There may be some RV dysfunction. The cavity size was normal.   FOLLOW UP PLANS AND APPOINTMENTS Allergies  Allergen Reactions  . Latex   . Penicillins   . Tetanus-Diphtheria Toxoids Td    Medication List  As of 07/22/2011  1:47 PM   TAKE these medications         amLODipine 10 MG tablet   Commonly known as: NORVASC   Take 1 tablet (10 mg total) by mouth daily.      aspirin 81 MG tablet   Take 81 mg by mouth daily.      bisacodyl 5 MG EC tablet   Commonly known as: DULCOLAX   Take 5 mg by mouth daily. For      carvedilol 3.125 MG tablet   Commonly known as: COREG   Take 1 tablet (3.125 mg total) by mouth 2 (two) times daily with a meal.      colchicine 0.6 MG tablet   Take 1 tablet (0.6 mg total) by mouth 2 (two) times daily.      FERRETTS 325 (106 FE) MG Tabs   Generic drug: ferrous fumarate   Take 1 tablet by mouth 2 (two) times daily.      furosemide 80 MG tablet   Commonly known as: LASIX   Take 1 tablet (80 mg total) by mouth 2 (two) times daily.      potassium chloride SA 20 MEQ tablet   Commonly known as: K-DUR,KLOR-CON   Take 2 tablets (40 mEq total) by mouth 2 (two) times daily.      sodium chloride 0.65 % Soln nasal spray   Commonly known as: OCEAN   Place 2 sprays into the nose as needed for congestion.      traMADol 50 MG tablet   Commonly known as: ULTRAM   Take 50 mg by mouth every 8 (eight) hours as needed. For pain           Discharge Orders    Future Appointments:  Provider: Department: Dept Phone: Center:   08/05/2011 9:15 AM Rollene Rotunda, MD [facility] [place] [place] [phone] [id number]     Follow-up Information    Follow up with Fayrene Fearing  Hochrein, MD. (June 27th at  9:15 am)    Contact information:   [address] [place] [address] [phone]          BRING ALL MEDICATIONS WITH YOU TO FOLLOW UP APPOINTMENTS  Time spent with patient to include physician time: 38 min Signed: Theodore Demark 07/22/2011, 1:25 PM Co-Sign MD

## 2011-07-22 NOTE — Progress Notes (Signed)
Pt had some occasional PVCs.  Pt asymptomatic.  Theodore Demark PA paged.

## 2011-07-22 NOTE — Progress Notes (Addendum)
   SUBJECTIVE: Complains of pain in legs, abdomen, sinuses. Mild dyspnea.  Diffuse bony pain which is chronic   PHYSICAL EXAM Filed Vitals:   07/21/11 0900 07/21/11 1750 07/21/11 2058 07/22/11 0521  BP: 102/62 110/70 134/63 142/75  Pulse: 80 83 73 79  Temp: 97 F (36.1 C)  97.7 F (36.5 C) 97.9 F (36.6 C)  TempSrc: Oral  Oral Oral  Resp: 18  18 22   Height:      Weight:    111.5 kg (245 lb 13 oz)  SpO2: 96%  97% 97%   General:  No distress, walks with walker Lungs:  Clear Heart:  RRR, no murmur or gallop Abdomen:  Positive bowel sounds, no rebound no guarding Extremities:  No edema.  LABS: Lab Results  Component Value Date   CKTOTAL 174 07/20/2011   CKMB 2.0 07/20/2011   TROPONINI <0.30 07/20/2011   Results for orders placed during the hospital encounter of 07/19/11 (from the past 24 hour(s))  BASIC METABOLIC PANEL     Status: Abnormal   Collection Time   07/22/11  6:08 AM      Component Value Range   Sodium 140  135 - 145 mEq/L   Potassium 3.3 (*) 3.5 - 5.1 mEq/L   Chloride 101  96 - 112 mEq/L   CO2 28  19 - 32 mEq/L   Glucose, Bld 90  70 - 99 mg/dL   BUN 19  6 - 23 mg/dL   Creatinine, Ser 1.47  0.50 - 1.10 mg/dL   Calcium 9.3  8.4 - 82.9 mg/dL   GFR calc non Af Amer 69 (*) >90 mL/min   GFR calc Af Amer 80 (*) >90 mL/min    Intake/Output Summary (Last 24 hours) at 07/22/11 0845 Last data filed at 07/22/11 0840  Gross per 24 hour  Intake    483 ml  Output    300 ml  Net    183 ml   Echo: Study Conclusions  - Left ventricle: Technically limited study. Can not see endocardium in all views. Contrast was used and did help some.. There is hypokinesis of the inferior wall and the inferolateral wall and possibly the lateral wall. The EF is in the 40% range. The cavity size was mildly to moderately dilated. - Aortic valve: Sclerosis without stenosis. No significant regurgitation. - Mitral valve: Mild regurgitation. - Left atrium: The atrium was mildly  dilated. - Right ventricle: The RV is poorly seen. There may be some RV dysfunction. The cavity size was normal.    ASSESSMENT AND PLAN:  1. 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  EF 40% unchanged. Switched to lasix to po yesterday. Potassium is a little low but improving.Plan on discharge today on current Lasix dose of 80 mg bid and potassium.  2. Left chest and shoulder pain/ diffuse bony pain::  Atypical.  No objective evidence of ischemia.  Ruled out.  No further work up for possible ischemia.  She has  chronic pain issues.    3. 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 Only mild on current Echo.   conservative management per Dr. Antoine Poche.    4. Tob Abuse: Uses snuff. Will obtain cessation counseling.   5. HTN: Stable.   6. Bladder prolapse:  I discussed with the patient and the family that this will require an out patient consult.    7. Morbid obesity BMI 48.5   Shelli Portilla [facility] 07/22/2011 8:45 AM

## 2011-07-23 NOTE — Discharge Summary (Signed)
Patient seen and examined and history reviewed. Agree with above findings and plan. See earlier rounding note.   [name] 07/23/2011 1:56 PM

## 2011-08-05 ENCOUNTER — Encounter: Payer: Self-pay | Admitting: Cardiology

## 2011-08-05 ENCOUNTER — Ambulatory Visit (INDEPENDENT_AMBULATORY_CARE_PROVIDER_SITE_OTHER): Payer: Medicare Other | Admitting: Cardiology

## 2011-08-05 VITALS — BP 115/70 | HR 80 | Ht 60.0 in | Wt 249.8 lb

## 2011-08-05 DIAGNOSIS — I428 Other cardiomyopathies: Secondary | ICD-10-CM

## 2011-08-05 DIAGNOSIS — I10 Essential (primary) hypertension: Secondary | ICD-10-CM

## 2011-08-05 DIAGNOSIS — I509 Heart failure, unspecified: Secondary | ICD-10-CM

## 2011-08-05 DIAGNOSIS — I5042 Chronic combined systolic (congestive) and diastolic (congestive) heart failure: Secondary | ICD-10-CM

## 2011-08-05 LAB — BASIC METABOLIC PANEL
Calcium: 9.6 mg/dL (ref 8.4–10.5)
GFR: 83.86 mL/min (ref >60.00)
Glucose, Bld: 76 mg/dL (ref 70–99)
Potassium: 3.6 mEq/L (ref 3.5–5.1)
Sodium: 139 mEq/L (ref 135–145)

## 2011-08-05 MED ORDER — FUROSEMIDE 80 MG PO TABS: 80.0000 mg | ORAL_TABLET | Freq: Every day | ORAL | Status: DC

## 2011-08-05 NOTE — Assessment & Plan Note (Signed)
This is being managed in the context of treating his CHF  

## 2011-08-05 NOTE — Progress Notes (Signed)
HPI The patient presents for followup of her mixed heart failure. She was recently in the hospital with volume overload.  She was discharged after diuresis. Her biggest problem was discomfort secondary to bladder prolapse.  She was supposed to get a urology appointment but she didn't call for this. She thinks her breathing is better than it was when she came into the hospital. She is weighing herself some days and she staying at about 242. She's only taking her Lasix once a day as she doesn't want to be in the bathroom all day long. With this she's not having any PND or orthopnea. She's not having any increased lower extremity swelling.  Allergies  Allergen Reactions  . Latex   . Penicillins   . Tetanus-Diphtheria Toxoids Td     Current Outpatient Prescriptions  Medication Sig Dispense Refill  . aspirin 81 MG tablet Take 81 mg by mouth daily.        . bisacodyl (DULCOLAX) 5 MG EC tablet Take 5 mg by mouth daily. For      . colchicine 0.6 MG tablet Take 1 tablet (0.6 mg total) by mouth 2 (two) times daily.  60 tablet  0  . ferrous fumarate (FERRETTS) 325 (106 FE) MG TABS Take 1 tablet by mouth 2 (two) times daily.       . furosemide (LASIX) 80 MG tablet Take 1 tablet (80 mg total) by mouth 2 (two) times daily.  60 tablet  11  . potassium chloride SA (K-DUR,KLOR-CON) 20 MEQ tablet Take 2 tablets (40 mEq total) by mouth 2 (two) times daily.  120 tablet  11  . sodium chloride (OCEAN) 0.65 % SOLN nasal spray Place 2 sprays into the nose as needed for congestion.  15 mL  0  . traMADol (ULTRAM) 50 MG tablet Take 50 mg by mouth every 8 (eight) hours as needed. For pain      . amLODipine (NORVASC) 10 MG tablet Take 1 tablet (10 mg total) by mouth daily.  30 tablet  0  . carvedilol (COREG) 3.125 MG tablet Take 1 tablet (3.125 mg total) by mouth 2 (two) times daily with a meal.  60 tablet  11    Past Medical History  Diagnosis Date  . HTN (hypertension)   . Diverticulosis   . Hemorrhoid   .  Osteoarthritis   . Constipation, chronic   . UTI (lower urinary tract infection)   . H/O: hysterectomy   . Systolic and diastolic CHF, chronic     a. 01/2009 Echo:  EF 40-45%, Gr 1 DD, Mod-Sev MR , Mod TR, PASP  . Female bladder prolapse     with prolapse uterus, cystocele, s/p TVH/BSO 12/2006  . Obesity (BMI 30-39.9)     wt. 94.1 kg 12/2010  . Moderate mitral regurgitation     a. Mod to Sev by echo 01/2009    Past Surgical History  Procedure Date  . Wrist surgery   . Vaginal hysterectomy,cystocele and prolapse  repair NOV. 2008    ROS:  Diffuse aches and pains.  Otherwise as stated in the HPI and negative for all other systems.  PHYSICAL EXAM BP 115/70  Pulse 80  Ht 5' (1.524 m)  Wt 249 lb 12.8 oz (113.309 kg)  BMI 48.79 kg/m2 PHYSICAL EXAM GEN:  No distress NECK:  No jugular venous distention at 90 degrees, waveform within normal limits, carotid upstroke brisk and symmetric, no bruits, no thyromegaly, conjunctiva injected LYMPHATICS:  No cervical adenopathy  LUNGS:  Clear to auscultation bilaterally BACK:  No CVA tenderness CHEST:  Unremarkable HEART:  S1 and S2 within normal limits, no S3, no S4, no clicks, no rubs, no murmurs ABD:  Positive bowel sounds normal in frequency in pitch, no bruits, no rebound, no guarding, unable to assess midline mass or bruit with the patient seated. EXT:  2 plus pulses throughout, mild edema, no cyanosis no clubbing   ASSESSMENT AND PLAN

## 2011-08-05 NOTE — Assessment & Plan Note (Signed)
She seems to be euvolemic.  I will continue the meds as listed. I will be checking a basic metabolic profile. She understands salt and fluid restriction.

## 2011-08-05 NOTE — Patient Instructions (Addendum)
Please have blood work today (BMP)  Take Lasix 80 mg once a day Continue all other medications as listed  Follow up with Tereso Newcomer in 4 months.

## 2011-09-27 ENCOUNTER — Telehealth: Payer: Self-pay | Admitting: Cardiology

## 2011-09-27 NOTE — Telephone Encounter (Signed)
New problem';  Per pam , status of cardiac clearance. Rectum proplase.

## 2011-09-27 NOTE — Telephone Encounter (Signed)
Left message for Pam that I have not been able to find any request for surgical clearance on this pt.  Requested she re-fax.

## 2011-10-05 NOTE — Telephone Encounter (Signed)
[facility] Pam: [phone] phone and [phone] fax Left message for Pam that pt will be seen 10/13/11.

## 2011-10-05 NOTE — Telephone Encounter (Signed)
Per Dr Antoine Poche - pt needs to be seen prior to surgical clearance.  appt scheduled for 9/4 - daughter aware.

## 2011-10-13 ENCOUNTER — Ambulatory Visit (INDEPENDENT_AMBULATORY_CARE_PROVIDER_SITE_OTHER): Payer: Medicare Other | Admitting: Physician Assistant

## 2011-10-13 ENCOUNTER — Encounter: Payer: Self-pay | Admitting: Physician Assistant

## 2011-10-13 ENCOUNTER — Inpatient Hospital Stay (HOSPITAL_COMMUNITY)
Admission: AD | Admit: 2011-10-13 | Discharge: 2011-10-23 | DRG: 308 | Disposition: A | Discharge status: Home / Self Care | Payer: Medicare Other | Source: Ambulatory Visit | Attending: Cardiology | Admitting: Cardiology

## 2011-10-13 ENCOUNTER — Encounter (HOSPITAL_COMMUNITY): Payer: Self-pay | Admitting: General Practice

## 2011-10-13 VITALS — BP 142/82 | HR 91 | Ht 61.0 in | Wt 257.4 lb

## 2011-10-13 DIAGNOSIS — I509 Heart failure, unspecified: Secondary | ICD-10-CM

## 2011-10-13 DIAGNOSIS — I059 Rheumatic mitral valve disease, unspecified: Secondary | ICD-10-CM | POA: Diagnosis present

## 2011-10-13 DIAGNOSIS — I4891 Unspecified atrial fibrillation: Secondary | ICD-10-CM

## 2011-10-13 DIAGNOSIS — Z01810 Encounter for preprocedural cardiovascular examination: Secondary | ICD-10-CM

## 2011-10-13 DIAGNOSIS — I5043 Acute on chronic combined systolic (congestive) and diastolic (congestive) heart failure: Secondary | ICD-10-CM | POA: Diagnosis present

## 2011-10-13 DIAGNOSIS — I10 Essential (primary) hypertension: Secondary | ICD-10-CM

## 2011-10-13 DIAGNOSIS — E669 Obesity, unspecified: Secondary | ICD-10-CM | POA: Diagnosis present

## 2011-10-13 DIAGNOSIS — Z6841 Body Mass Index (BMI) 40.0 and over, adult: Secondary | ICD-10-CM

## 2011-10-13 DIAGNOSIS — D509 Iron deficiency anemia, unspecified: Secondary | ICD-10-CM

## 2011-10-13 DIAGNOSIS — N8110 Cystocele, unspecified: Secondary | ICD-10-CM

## 2011-10-13 DIAGNOSIS — K5900 Constipation, unspecified: Secondary | ICD-10-CM | POA: Diagnosis present

## 2011-10-13 DIAGNOSIS — N8111 Cystocele, midline: Secondary | ICD-10-CM | POA: Diagnosis present

## 2011-10-13 DIAGNOSIS — R109 Unspecified abdominal pain: Secondary | ICD-10-CM

## 2011-10-13 DIAGNOSIS — I110 Hypertensive heart disease with heart failure: Secondary | ICD-10-CM | POA: Diagnosis present

## 2011-10-13 DIAGNOSIS — E876 Hypokalemia: Secondary | ICD-10-CM

## 2011-10-13 HISTORY — DX: Unspecified atrial fibrillation: I48.91

## 2011-10-13 HISTORY — DX: Cardiac arrhythmia, unspecified: I49.9

## 2011-10-13 HISTORY — DX: Iron deficiency anemia, unspecified: D50.9

## 2011-10-13 LAB — COMPREHENSIVE METABOLIC PANEL
ALT: 8 U/L (ref 0–35)
BUN: 6 mg/dL (ref 6–23)
Calcium: 9.6 mg/dL (ref 8.4–10.5)
GFR calc Af Amer: >90 mL/min (ref >90)
Glucose, Bld: 105 mg/dL — ABNORMAL HIGH (ref 70–99)
Sodium: 135 mEq/L (ref 135–145)
Total Protein: 7.7 g/dL (ref 6.0–8.3)

## 2011-10-13 LAB — CBC WITH DIFFERENTIAL/PLATELET
Eosinophils Absolute: 0.1 10*3/uL (ref 0.0–0.7)
Eosinophils Relative: 2 % (ref 0–5)
Lymphs Abs: 1.2 10*3/uL (ref 0.7–4.0)
MCH: 29.2 pg (ref 26.0–34.0)
MCHC: 32 g/dL (ref 30.0–36.0)
MCV: 91.2 fL (ref 78.0–100.0)
Platelets: 463 10*3/uL — ABNORMAL HIGH (ref 150–400)
RBC: 3.87 MIL/uL (ref 3.87–5.11)

## 2011-10-13 LAB — APTT: aPTT: 40 seconds — ABNORMAL HIGH (ref 24–37)

## 2011-10-13 LAB — MAGNESIUM: Magnesium: 2.2 mg/dL (ref 1.5–2.5)

## 2011-10-13 LAB — HEPARIN LEVEL (UNFRACTIONATED): Heparin Unfractionated: 0.16 IU/mL — ABNORMAL LOW (ref 0.30–0.70)

## 2011-10-13 LAB — PROTIME-INR: INR: 1.14 (ref 0.00–1.49)

## 2011-10-13 MED ORDER — SODIUM CHLORIDE 0.9 % IJ SOLN
3.0000 mL | Freq: Two times a day (BID) | INTRAMUSCULAR | Status: DC
  Administered 2011-10-14 – 2011-10-23 (×14): 3 mL via INTRAVENOUS

## 2011-10-13 MED ORDER — ACETAMINOPHEN 325 MG PO TABS
650.0000 mg | ORAL_TABLET | ORAL | Status: DC | PRN
  Administered 2011-10-21: 650 mg via ORAL
  Filled 2011-10-13: qty 2

## 2011-10-13 MED ORDER — ASPIRIN 81 MG PO TABS: 81.0000 mg | ORAL_TABLET | Freq: Every day | ORAL | Status: DC

## 2011-10-13 MED ORDER — HEPARIN (PORCINE) IN NACL 100-0.45 UNIT/ML-% IJ SOLN
1850.0000 [IU]/h | INTRAMUSCULAR | Status: DC
  Administered 2011-10-13: 1300 [IU]/h via INTRAVENOUS
  Administered 2011-10-14: 1700 [IU]/h via INTRAVENOUS
  Administered 2011-10-14: 1600 [IU]/h via INTRAVENOUS
  Administered 2011-10-14: 1800 [IU]/h via INTRAVENOUS
  Administered 2011-10-15 – 2011-10-16 (×2): 1550 [IU]/h via INTRAVENOUS
  Administered 2011-10-17: 1850 [IU]/h via INTRAVENOUS
  Administered 2011-10-17: 1550 [IU]/h via INTRAVENOUS
  Administered 2011-10-17 – 2011-10-18 (×3): 1850 [IU]/h via INTRAVENOUS
  Filled 2011-10-13 (×15): qty 250

## 2011-10-13 MED ORDER — LISINOPRIL 5 MG PO TABS
5.0000 mg | ORAL_TABLET | Freq: Every day | ORAL | Status: DC
  Administered 2011-10-13 – 2011-10-23 (×11): 5 mg via ORAL
  Filled 2011-10-13 (×11): qty 1

## 2011-10-13 MED ORDER — ASPIRIN EC 81 MG PO TBEC
81.0000 mg | DELAYED_RELEASE_TABLET | Freq: Every day | ORAL | Status: DC
  Administered 2011-10-13 – 2011-10-20 (×8): 81 mg via ORAL
  Filled 2011-10-13 (×9): qty 1

## 2011-10-13 MED ORDER — FERROUS FUMARATE 325 (106 FE) MG PO TABS
1.0000 | ORAL_TABLET | Freq: Two times a day (BID) | ORAL | Status: DC
  Administered 2011-10-13 – 2011-10-23 (×21): 106 mg via ORAL
  Filled 2011-10-13 (×24): qty 1

## 2011-10-13 MED ORDER — FUROSEMIDE 10 MG/ML IJ SOLN
60.0000 mg | Freq: Two times a day (BID) | INTRAMUSCULAR | Status: DC
  Administered 2011-10-13 – 2011-10-17 (×8): 60 mg via INTRAVENOUS
  Filled 2011-10-13 (×8): qty 6

## 2011-10-13 MED ORDER — AMLODIPINE BESYLATE 10 MG PO TABS
10.0000 mg | ORAL_TABLET | Freq: Every day | ORAL | Status: DC
  Administered 2011-10-13 – 2011-10-23 (×11): 10 mg via ORAL
  Filled 2011-10-13 (×11): qty 1

## 2011-10-13 MED ORDER — SALINE SPRAY 0.65 % NA SOLN: 2.0000 | NASAL | Status: DC | PRN

## 2011-10-13 MED ORDER — POTASSIUM CHLORIDE CRYS ER 20 MEQ PO TBCR
20.0000 meq | EXTENDED_RELEASE_TABLET | Freq: Three times a day (TID) | ORAL | Status: DC
  Administered 2011-10-13 – 2011-10-23 (×31): 20 meq via ORAL
  Filled 2011-10-13 (×37): qty 1

## 2011-10-13 MED ORDER — HEPARIN BOLUS VIA INFUSION
3000.0000 [IU] | Freq: Once | INTRAVENOUS | Status: AC
  Administered 2011-10-13: 3000 [IU] via INTRAVENOUS
  Filled 2011-10-13: qty 3000

## 2011-10-13 MED ORDER — SODIUM CHLORIDE 0.9 % IJ SOLN: 3.0000 mL | INTRAMUSCULAR | Status: DC | PRN

## 2011-10-13 MED ORDER — SODIUM CHLORIDE 0.9 % IV SOLN: 250.0000 mL | INTRAVENOUS | Status: DC | PRN

## 2011-10-13 MED ORDER — HYDROCODONE-ACETAMINOPHEN 5-325 MG PO TABS
1.0000 | ORAL_TABLET | Freq: Four times a day (QID) | ORAL | Status: DC | PRN
  Administered 2011-10-13 – 2011-10-23 (×32): 1 via ORAL
  Filled 2011-10-13 (×33): qty 1

## 2011-10-13 MED ORDER — CARVEDILOL 3.125 MG PO TABS
3.1250 mg | ORAL_TABLET | Freq: Two times a day (BID) | ORAL | Status: DC
  Administered 2011-10-13 – 2011-10-22 (×18): 3.125 mg via ORAL
  Filled 2011-10-13 (×21): qty 1

## 2011-10-13 MED ORDER — ONDANSETRON HCL 4 MG/2ML IJ SOLN: 4.0000 mg | Freq: Four times a day (QID) | INTRAMUSCULAR | Status: DC | PRN

## 2011-10-13 MED ORDER — TRAMADOL HCL 50 MG PO TABS
50.0000 mg | ORAL_TABLET | Freq: Three times a day (TID) | ORAL | Status: DC | PRN
  Administered 2011-10-13 – 2011-10-23 (×3): 50 mg via ORAL
  Filled 2011-10-13 (×5): qty 1

## 2011-10-13 MED ORDER — POTASSIUM CHLORIDE CRYS ER 20 MEQ PO TBCR: 20.0000 meq | EXTENDED_RELEASE_TABLET | Freq: Two times a day (BID) | ORAL | Status: DC

## 2011-10-13 MED ORDER — FUROSEMIDE 10 MG/ML IJ SOLN
INTRAMUSCULAR | Status: AC
  Filled 2011-10-13: qty 8

## 2011-10-13 MED ORDER — BISACODYL 5 MG PO TBEC
5.0000 mg | DELAYED_RELEASE_TABLET | Freq: Every day | ORAL | Status: DC
  Administered 2011-10-13 – 2011-10-23 (×9): 5 mg via ORAL
  Filled 2011-10-13 (×9): qty 1

## 2011-10-13 NOTE — H&P (Signed)
History and Physical   Date:  10/13/2011   Name:  [name]   DOB:  [birth year]   MRN:  [id number]  PCP:  Astrid Divine, MD  Primary Cardiologist:  Dr. Rollene Rotunda  Primary Electrophysiologist:  None    History of Present Illness: [name] is a 77 y.o. female who returns for surgical clearance.  She has a history of combined systolic and diastolic CHF, cardiomyopathy with an EF of 40%, HTN, chronic abdominal pain. Echo 07/2011: Technically limited; inferior HK, inferolateral and possibly lateral wall HK, EF 60%, moderate LVE, aortic sclerosis without stenosis, mild MR, mild LAE, question RV dysfunction. She was last admitted in 07/2011 with acute on chronic CHF. She had chest pain and cardiac markers were negative. No further cardiac workup was pursued. Last seen by Dr. Antoine Poche 08/05/11.  She needs corrective surgery for bladder prolapse. However, she comes into the office today with increasing dyspnea with exertion. This was quite evident walking into the room today. Her weight is up 8 pounds since her last visit. Her daughter who is with her today notes that her swelling has increased and also has noted worsening shortness of breath. She denies chest pain. She has had some shoulder pain. She has a cough that is productive of the clear sputum. She sleeps on 2 pillows. She denies PND. She denies palpitations.  Wt Readings from Last 3 Encounters:  10/13/11 257 lb 6.4 oz (116.756 kg)  08/05/11 249 lb 12.8 oz (113.309 kg)  07/22/11 245 lb 13 oz (111.5 kg)     Past Medical History  Diagnosis Date  . HTN (hypertension)   . Diverticulosis   . Hemorrhoid   . Osteoarthritis   . Constipation, chronic   . UTI (lower urinary tract infection)   . H/O: hysterectomy   . Systolic and diastolic CHF, chronic     a. 01/2009 Echo:  EF 40-45%, Gr 1 DD, Mod-Sev MR , Mod TR, PASP  . Female bladder prolapse     with prolapse uterus, cystocele, s/p TVH/BSO 12/2006  . Obesity (BMI  30-39.9)     wt. 94.1 kg 12/2010  . Moderate mitral regurgitation     a. Mod to Sev by echo 01/2009    Current Outpatient Prescriptions  Medication Sig Dispense Refill  . amLODipine (NORVASC) 10 MG tablet Take 1 tablet (10 mg total) by mouth daily.  30 tablet  0  . aspirin 81 MG tablet Take 81 mg by mouth daily.        . bisacodyl (DULCOLAX) 5 MG EC tablet Take 5 mg by mouth daily. For      . carvedilol (COREG) 3.125 MG tablet Take 1 tablet (3.125 mg total) by mouth 2 (two) times daily with a meal.  60 tablet  11  . ferrous fumarate (FERRETTS) 325 (106 FE) MG TABS Take 1 tablet by mouth 2 (two) times daily.       . furosemide (LASIX) 80 MG tablet Take 1 tablet (80 mg total) by mouth daily.  60 tablet  11  . HYDROcodone-acetaminophen (VICODIN) 5-500 MG per tablet Take 1 tablet by mouth every 8 (eight) hours as needed.       . potassium chloride SA (K-DUR,KLOR-CON) 20 MEQ tablet Take 2 tablets (40 mEq total) by mouth 2 (two) times daily.  120 tablet  11  . sodium chloride (OCEAN) 0.65 % SOLN nasal spray Place 2 sprays into the nose as needed for congestion.  15 mL  0  .  traMADol (ULTRAM) 50 MG tablet Take 50 mg by mouth every 8 (eight) hours as needed. For pain      . colchicine 0.6 MG tablet Take 1 tablet (0.6 mg total) by mouth 2 (two) times daily.  60 tablet  0    Allergies: Allergies  Allergen Reactions  . Latex   . Penicillins   . Tetanus-Diphtheria Toxoids Td     History  Substance Use Topics  . Smoking status: Never Smoker   . Smokeless tobacco: Current User   Comment: Uses Snuff regularly.  Dips several x/day.  A "large can" can last her up to 3 mos.  . Alcohol Use: No     Family History  Problem Relation Age of Onset  . Diabetes Brother   . Hypertension Mother     deceased @ 17  . Leukemia Brother   . Cancer Brother   . Stroke Mother   . Diabetes Father     deceased in his 109's     ROS:  Please see the history of present illness.   She has had some dark stools.  She is on iron therapy. She denies syncope.  All other systems reviewed and negative.   PHYSICAL EXAM: VS:  BP 142/82  Pulse 91  Ht 5\' 1"  (1.549 m)  Wt 257 lb 6.4 oz (116.756 kg)  BMI 48.64 kg/m2 Well nourished, well developed, in no acute distress HEENT: normal Neck: + JVD at 90 Cardiac:  normal S1, S2; irregularly irregular rhythm; 1-2/6 systolic ejection murmur heard best along the left lower sternal border Lungs:  Coarse breath sounds at the bases bilaterally, no wheezing, rhonchi  Abd: soft, nontender Ext: Trace to 1+ bilateral LE edema Skin: warm and dry Neuro:  CNs 2-12 intact, no focal abnormalities noted  EKG:  Atrial fibrillation, heart rate 91, left axis deviation, poor R wave progression     ASSESSMENT AND PLAN:  1. Acute on Chronic Combined Systolic and Diastolic CHF: She is volume overloaded. She is now in atrial fibrillation which is new. She was quite short of breath just walking into the office today. Her family reports that this is much worse. I believe she would benefit from admission to the hospital for IV diuresis. I discussed this with Dr. Antoine Poche who agreed. Plan admission to [facility]. I will place her on Lasix 60 mg IV twice a day initially.  Also, add Lisinopril 5 mg daily to medical regimen.  2. Atrial Fibrillation: CHADS2 score is 3. She needs long-term anticoagulation to prevent stroke. She will be placed on heparin in the hospital and this can be transitioned over to Coumadin or one of the novel anticoagulants. Of note, she has a h/o diverticulitis but no significant GI bleeds in the past.  Rate is controlled. Continue current rate controlling medications.  3. Prolapsed Bladder: Surgery is pending. She needs diuresis for her CHF. We can address clearance for her surgery at a later date.  4. Hypertension: Continue current medications.  Signed, Tereso Newcomer, PA-C  10:17 AM 10/13/2011   History and all data above reviewed.  Patient examined.  I agree with  the findings as above.  The patient exam reveals COR: Irregular.    ,  Lungs: Decreased breath sounds  ,  Abd: Positive bowel sounds, no rebound no guarding., Ext mild edmea  .  All available labs, radiology testing, previous records reviewed. Agree with documented assessment and plan. It is always difficult to assess her volume but she is more  SOB.  Her weight is up 8 lbs.  In addition, she is in atrial fibrillation.  Since she is tenuous at best for bladder surgery.  I agree that she needs to be tuned up as best as possible prior to any planned surgery.     Fayrene Fearing Lorey Pallett  6:15 PM  10/13/2011

## 2011-10-13 NOTE — Progress Notes (Signed)
ANTICOAGULATION CONSULT NOTE - Initial Consult  Pharmacy Consult for Heparin Indication: atrial fibrillation  Allergies  Allergen Reactions  . Latex   . Penicillins   . Tetanus-Diphtheria Toxoids Td     Patient Measurements:   Total Body weight 257 lb (116 kg) 1.25x IBW = 56 kg Height 5 feet Heparin Dosing Weight: 74 kg  Vital Signs: BP: 142/82 mmHg (09/04 1018) Pulse Rate: 91  (09/04 1018)  Labs: No results found for this basename: HGB:2,HCT:3,PLT:3,APTT:3,LABPROT:3,INR:3,HEPARINUNFRC:3,CREATININE:3,CKTOTAL:3,CKMB:3,TROPONINI:3 in the last 72 hours  The CrCl is unknown because both a height and weight (above a minimum accepted value) are required for this calculation.   Medical History: Past Medical History  Diagnosis Date  . HTN (hypertension)   . Diverticulosis   . Hemorrhoid   . Osteoarthritis   . Constipation, chronic   . UTI (lower urinary tract infection)   . H/O: hysterectomy   . Systolic and diastolic CHF, chronic     a. 01/2009 Echo:  EF 40-45%, Gr 1 DD, Mod-Sev MR , Mod TR, PASP ;  b. Echo 07/2011: Technically limited; inferior HK, inferolateral and possibly lateral wall HK, EF 60%, moderate LVE, aortic sclerosis without stenosis, mild MR, mild LAE, question RV dysfunction.    . Female bladder prolapse     with prolapse uterus, cystocele, s/p TVH/BSO 12/2006  . Obesity (BMI 30-39.9)     wt. 94.1 kg 12/2010  . Moderate mitral regurgitation     a. Mod to Sev by echo 01/2009;  b. mild MR by echo 2013    Medications:  Prescriptions prior to admission  Medication Sig Dispense Refill  . amLODipine (NORVASC) 10 MG tablet Take 1 tablet (10 mg total) by mouth daily.  30 tablet  0  . aspirin 81 MG tablet Take 81 mg by mouth daily.        . bisacodyl (DULCOLAX) 5 MG EC tablet Take 5 mg by mouth daily. For      . carvedilol (COREG) 3.125 MG tablet Take 1 tablet (3.125 mg total) by mouth 2 (two) times daily with a meal.  60 tablet  11  . colchicine 0.6 MG  tablet Take 1 tablet (0.6 mg total) by mouth 2 (two) times daily.  60 tablet  0  . ferrous fumarate (FERRETTS) 325 (106 FE) MG TABS Take 1 tablet by mouth 2 (two) times daily.       . furosemide (LASIX) 80 MG tablet Take 1 tablet (80 mg total) by mouth daily.  60 tablet  11  . HYDROcodone-acetaminophen (VICODIN) 5-500 MG per tablet Take 1 tablet by mouth every 8 (eight) hours as needed.       . potassium chloride SA (K-DUR,KLOR-CON) 20 MEQ tablet Take 1 tablet (20 mEq total) by mouth 2 (two) times daily.  120 tablet  11  . sodium chloride (OCEAN) 0.65 % SOLN nasal spray Place 2 sprays into the nose as needed for congestion.  15 mL  0  . traMADol (ULTRAM) 50 MG tablet Take 50 mg by mouth every 8 (eight) hours as needed. For pain         Admit Complaint: 77 y.o.  female  admitted 10/13/2011 with shortness of breath in cardiologists office for cardiac clearance for bladder surgery, found to be in atrial fibrillation .  Pharmacy consulted to dose heparin pending transition to oral agent.  Assessment: Anticoagulation: atrial fibrillation. Patient denies recent bleeding, but noted to have a history of anemia on oral iron and noted to have some  dark stools.  Evaluate baseline labs and initiate heparin.  Cardiovascular: CHF (EF pending), HTN Endocrinology:   Nephrology/Urology/Electrolytes: bladder prolapse  Hematology / Oncology: Anemia  PTA Medication Issues: Home Meds Not Ordered:   Best Practices: DVT Prophylaxis:    Goal of Therapy:  Heparin level 0.3-0.7 units/ml Monitor platelets by anticoagulation protocol: Yes   Plan:  Draw baseline CBC, PT/INR, PTT then initiate heparin: Give 4000 units bolus x 1 Start heparin infusion at 1200 units/hr Check anti-Xa level in 8 hours and daily while on heparin Continue to monitor H&H and platelets   Thank you for allowing pharmacy to be a part of this patients care team.  Lovenia Kim Pharm.D., BCPS Clinical Pharmacist 10/13/2011 12:34  PM Pager: [phone] Phone: [phone]

## 2011-10-13 NOTE — Progress Notes (Signed)
ANTICOAGULATION CONSULT NOTE - Follow Up Consult  Pharmacy Consult for Heparin Indication: atrial fibrillation  Allergies  Allergen Reactions  . Penicillins Other (See Comments)    unknown  . Tetanus-Diphtheria Toxoids Td Other (See Comments)    unknown  . Latex Hives and Rash    Patient Measurements: Height: 5\' 1"  (154.9 cm) Weight: 258 lb 8 oz (117.255 kg) IBW/kg (Calculated) : 47.8  Heparin Dosing Weight: 77 kg  Vital Signs: Temp: 98.7 F (37.1 C) (09/04 1200) Temp src: Oral (09/04 1200) BP: 122/76 mmHg (09/04 2030) Pulse Rate: 89  (09/04 2030)  Labs:  Basename 10/13/11 2103 10/13/11 1759 10/13/11 1426  HGB -- -- 11.3*  HCT -- -- 35.3*  PLT -- -- 463*  APTT -- -- 40*  LABPROT -- -- 14.8  INR -- -- 1.14  HEPARINUNFRC 0.16* -- --  CREATININE -- -- 0.79  CKTOTAL -- -- --  CKMB -- -- --  TROPONINI -- <0.30 <0.30    Estimated Creatinine Clearance: 72.5 ml/min (by C-G formula based on Cr of 0.79).   Medications:  Infusions:    . heparin 1,300 Units/hr (10/13/11 1753)    Assessment: 77 y.o. F started on heparin this morning for new-onset Afib. The first heparin level this evening is SUBtherapeutic (HL 0.16, goal of 0.3-0.7) however the level was drawn ~3 hours after the drip was initiated. Will redraw level in 5 hours to get true estimation of heparin accumulation.   Goal of Therapy:  Heparin level 0.3-0.7 units/ml Monitor platelets by anticoagulation protocol: Yes   Plan:  1. Continue heparin at current drip rate of 1300 units/hr 2. Will recheck heparin level at true steady state -- around 0200 on 9/5 and re-evaluate dosing at that time.  Georgina Pillion, PharmD, BCPS Clinical Pharmacist Pager: [phone] 10/13/2011 9:57 PM

## 2011-10-13 NOTE — Progress Notes (Signed)
[address] [place], [place]  [address] Phone: [phone] Fax:  [phone]  Date:  10/13/2011   Name:  [name]   DOB:  10/30/1934   MRN:  [id number]  PCP:  Astrid Divine, MD  Primary Cardiologist:  Dr. Rollene Rotunda  Primary Electrophysiologist:  None    History of Present Illness: [name] is a 77 y.o. female who returns for surgical clearance.  She has a history of combined systolic and diastolic CHF, cardiomyopathy with an EF of 40%, HTN, chronic abdominal pain. Echo 07/2011: Technically limited; inferior HK, inferolateral and possibly lateral wall HK, EF 60%, moderate LVE, aortic sclerosis without stenosis, mild MR, mild LAE, question RV dysfunction. She was last admitted in 07/2011 with acute on chronic CHF. She had chest pain and cardiac markers were negative. No further cardiac workup was pursued. Last seen by Dr. Antoine Poche 08/05/11.  She needs corrective surgery for bladder prolapse. However, she comes into the office today with increasing dyspnea with exertion. This was quite evident walking into the room today. Her weight is up 8 pounds since her last visit. Her daughter who is with her today notes that her swelling has increased and also has noted worsening shortness of breath. She denies chest pain. She has had some shoulder pain. She has a cough that is productive of the clear sputum. She sleeps on 2 pillows. She denies PND. She denies palpitations.  Wt Readings from Last 3 Encounters:  10/13/11 257 lb 6.4 oz (116.756 kg)  08/05/11 249 lb 12.8 oz (113.309 kg)  07/22/11 245 lb 13 oz (111.5 kg)     Past Medical History  Diagnosis Date  . HTN (hypertension)   . Diverticulosis   . Hemorrhoid   . Osteoarthritis   . Constipation, chronic   . UTI (lower urinary tract infection)   . H/O: hysterectomy   . Systolic and diastolic CHF, chronic     a. 01/2009 Echo:  EF 40-45%, Gr 1 DD, Mod-Sev MR , Mod TR, PASP  . Female  bladder prolapse     with prolapse uterus, cystocele, s/p TVH/BSO 12/2006  . Obesity (BMI 30-39.9)     wt. 94.1 kg 12/2010  . Moderate mitral regurgitation     a. Mod to Sev by echo 01/2009    Current Outpatient Prescriptions  Medication Sig Dispense Refill  . amLODipine (NORVASC) 10 MG tablet Take 1 tablet (10 mg total) by mouth daily.  30 tablet  0  . aspirin 81 MG tablet Take 81 mg by mouth daily.        . bisacodyl (DULCOLAX) 5 MG EC tablet Take 5 mg by mouth daily. For      . carvedilol (COREG) 3.125 MG tablet Take 1 tablet (3.125 mg total) by mouth 2 (two) times daily with a meal.  60 tablet  11  . ferrous fumarate (FERRETTS) 325 (106 FE) MG TABS Take 1 tablet by mouth 2 (two) times daily.       . furosemide (LASIX) 80 MG tablet Take 1 tablet (80 mg total) by mouth daily.  60 tablet  11  . HYDROcodone-acetaminophen (VICODIN) 5-500 MG per tablet Take 1 tablet by mouth every 8 (eight) hours as needed.       . potassium chloride SA (K-DUR,KLOR-CON) 20 MEQ tablet Take 2 tablets (40 mEq total) by mouth 2 (two) times daily.  120 tablet  11  . sodium chloride (OCEAN) 0.65 % SOLN nasal spray Place 2  sprays into the nose as needed for congestion.  15 mL  0  . traMADol (ULTRAM) 50 MG tablet Take 50 mg by mouth every 8 (eight) hours as needed. For pain      . colchicine 0.6 MG tablet Take 1 tablet (0.6 mg total) by mouth 2 (two) times daily.  60 tablet  0    Allergies: Allergies  Allergen Reactions  . Latex   . Penicillins   . Tetanus-Diphtheria Toxoids Td     History  Substance Use Topics  . Smoking status: Never Smoker   . Smokeless tobacco: Current User   Comment: Uses Snuff regularly.  Dips several x/day.  A "large can" can last her up to 3 mos.  . Alcohol Use: No     Family History  Problem Relation Age of Onset  . Diabetes Brother   . Hypertension Mother     deceased @ 20  . Leukemia Brother   . Cancer Brother   . Stroke Mother   . Diabetes Father     deceased in his  53's     ROS:  Please see the history of present illness.   She has had some dark stools. She is on iron therapy. She denies syncope.  All other systems reviewed and negative.   PHYSICAL EXAM: VS:  BP 142/82  Pulse 91  Ht 5\' 1"  (1.549 m)  Wt 257 lb 6.4 oz (116.756 kg)  BMI 48.64 kg/m2 Well nourished, well developed, in no acute distress HEENT: normal Neck: + JVD at 90 Cardiac:  normal S1, S2; irregularly irregular rhythm; 1-2/6 systolic ejection murmur heard best along the left lower sternal border Lungs:  Coarse breath sounds at the bases bilaterally, no wheezing, rhonchi  Abd: soft, nontender Ext: Trace to 1+ bilateral LE edema Skin: warm and dry Neuro:  CNs 2-12 intact, no focal abnormalities noted  EKG:  Atrial fibrillation, heart rate 91, left axis deviation, poor R wave progression     ASSESSMENT AND PLAN:  1. Acute on Chronic Combined Systolic and Diastolic CHF: She is volume overloaded. She is now in atrial fibrillation which is new. She was quite short of breath just walking into the office today. Her family reports that this is much worse. I believe she would benefit from admission to the hospital for IV diuresis. I discussed this with Dr. Antoine Poche who agreed. Plan admission to [facility]. I will place her on Lasix 60 mg IV twice a day initially.  2. Atrial Fibrillation: CHADS2 score is 3. She needs long-term anticoagulation to prevent stroke. She will be placed on heparin in the hospital and this can be transitioned over to Coumadin or one of the novel anticoagulants. Of note, she has a h/o diverticulitis but no significant GI bleeds in the past.  Rate is controlled. Continue current rate controlling medications.  3. Prolapsed Bladder: Surgery is pending. She needs diuresis for her CHF. We can address clearance for her surgery at a later date.  4. Hypertension: Continue current medications.  Signed, Tereso Newcomer, PA-C  10:17 AM 10/13/2011

## 2011-10-13 NOTE — Progress Notes (Signed)
Pt heart rate dropped down to 39 non sustaining.  Returned to baseline in the 80's.  Pt asymptomatic at the time resting comfortably in bed speaking with admission RN.  MD has been made aware.  New orders given.  Will continue to monitor. Nino Glow RN

## 2011-10-14 ENCOUNTER — Inpatient Hospital Stay (HOSPITAL_COMMUNITY): Payer: Medicare Other

## 2011-10-14 LAB — HEPARIN LEVEL (UNFRACTIONATED): Heparin Unfractionated: 0.74 IU/mL — ABNORMAL HIGH (ref 0.30–0.70)

## 2011-10-14 LAB — CBC
HCT: 32.5 % — ABNORMAL LOW (ref 36.0–46.0)
MCV: 90.8 fL (ref 78.0–100.0)
Platelets: 430 10*3/uL — ABNORMAL HIGH (ref 150–400)
RBC: 3.58 MIL/uL — ABNORMAL LOW (ref 3.87–5.11)
WBC: 7.1 10*3/uL (ref 4.0–10.5)

## 2011-10-14 LAB — BASIC METABOLIC PANEL
CO2: 27 mEq/L (ref 19–32)
Chloride: 98 mEq/L (ref 96–112)
Potassium: 3.2 mEq/L — ABNORMAL LOW (ref 3.5–5.1)
Sodium: 134 mEq/L — ABNORMAL LOW (ref 135–145)

## 2011-10-14 MED ORDER — HEPARIN BOLUS VIA INFUSION
2500.0000 [IU] | Freq: Once | INTRAVENOUS | Status: AC
  Administered 2011-10-14: 2500 [IU] via INTRAVENOUS
  Filled 2011-10-14: qty 2500

## 2011-10-14 MED ORDER — POTASSIUM CHLORIDE CRYS ER 20 MEQ PO TBCR
40.0000 meq | EXTENDED_RELEASE_TABLET | Freq: Once | ORAL | Status: AC
  Administered 2011-10-14: 40 meq via ORAL

## 2011-10-14 MED ORDER — SENNA 8.6 MG PO TABS
1.0000 | ORAL_TABLET | Freq: Every day | ORAL | Status: DC
  Administered 2011-10-14 – 2011-10-22 (×4): 8.6 mg via ORAL
  Filled 2011-10-14 (×10): qty 1

## 2011-10-14 NOTE — Progress Notes (Deleted)
RN had called for Breathing tx stating pt was having a difficult time breathing.  Upon entering the room RN was starting tx.  Spo2 was then called stating pt was 74%.  Continued tx with 6lpm bleed in.  No improvement.  50% VM was then applied  With a SPO2 of 84.  Pt was then placed on 100% NRB.  Pt MDs was called.  Now at bedside. CPT was applied for 5 minutes by hand.  BBS rhonchi coarse throughout.  Will continue to monitor pt.

## 2011-10-14 NOTE — Progress Notes (Signed)
ANTICOAGULATION CONSULT NOTE - Follow Up Consult  Labs:  Basename 10/14/11 0112 10/14/11 0106 10/13/11 2103 10/13/11 1759 10/13/11 1426  HGB -- 10.4* -- -- 11.3*  HCT -- 32.5* -- -- 35.3*  PLT -- 430* -- -- 463*  APTT -- -- -- -- 40*  LABPROT -- -- -- -- 14.8  INR -- -- -- -- 1.14  HEPARINUNFRC 0.10* -- 0.16* -- --  CREATININE -- -- -- -- 0.79  CKTOTAL -- -- -- -- --  CKMB -- -- -- -- --  TROPONINI -- -- -- <0.30 <0.30    Assessment: 77yo female subtherapeutic on heparin with initial dosing for Afib.  Goal of Therapy:  Heparin level 0.3-0.7 units/ml   Plan:  Will rebolus with heparin 2500 units x1 and increase gtt by 4 units/kg(ABW)/hr to 1600 units/hr and check level in 6-8hr.  Colleen Can PharmD BCPS 10/14/2011,2:27 AM

## 2011-10-14 NOTE — Progress Notes (Signed)
Cardiology Progress Note Patient Name: [name] Date of Encounter: 10/14/2011, 7:20 AM     Subjective  Remains in atrial fibrillation with asymptomatic bradycardia overnight. Pt thinks breathing is the same. Denies chest pain. C/o constipation and diffuse abdominal pain.   Objective   Telemetry: Atrial Fibrillation, 60-80s, some drops in the 30-40s  Medications: . amLODipine  10 mg Oral Daily  . aspirin EC  81 mg Oral Daily  . bisacodyl  5 mg Oral Daily  . carvedilol  3.125 mg Oral BID WC  . ferrous fumarate  1 tablet Oral BID  . furosemide      . furosemide  60 mg Intravenous BID  . heparin  2,500 Units Intravenous Once  . heparin  3,000 Units Intravenous Once  . lisinopril  5 mg Oral Daily  . potassium chloride  20 mEq Oral TID  . sodium chloride  3 mL Intravenous Q12H  . DISCONTD: aspirin  81 mg Oral Daily   . heparin 1,600 Units/hr (10/14/11 1610)    Physical Exam: Temp:  [98 F (36.7 C)-98.7 F (37.1 C)] 98 F (36.7 C) (09/05 0522) Pulse Rate:  [83-91] 88  (09/05 0522) Resp:  [18] 18  (09/05 0522) BP: (122-142)/(65-92) 132/65 mmHg (09/05 0522) SpO2:  [93 %-99 %] 99 % (09/05 0522) Weight:  [257 lb 6.4 oz (116.756 kg)-261 lb 3.2 oz (118.48 kg)] 261 lb 3.2 oz (118.48 kg) (09/05 0522)  General: Morbidly obese elderly female, in no acute distress. Head: Normocephalic, atraumatic, sclera non-icteric, nares are without discharge.  Neck: Supple.  (+) JVD Lungs: Bibasilar rales; No wheezes or rhonchi. Breathing is unlabored. Heart: Distant heart sounds. Irregularly irregular S1 S2 without murmurs, rubs, or gallops.  Abdomen: Obese, Soft, diffusely tender, non-distended with normoactive bowel sounds. No rebound/guarding. No obvious abdominal masses. Msk:  Strength and tone appear normal for age. Extremities: Trace to 1+ BLE edema. No clubbing or cyanosis. Distal pedal pulses are intact and equal bilaterally. Neuro: Alert and oriented X 3. Moves all extremities  spontaneously. Psych:  Responds to questions appropriately with a normal affect.   Intake/Output Summary (Last 24 hours) at 10/14/11 0720 Last data filed at 10/13/11 2253  Gross per 24 hour  Intake    240 ml  Output   1450 ml  Net  -1210 ml    Labs:  [facility] 10/14/11 0106 10/13/11 1426  NA 134* 135  K 3.2* 3.6  CL 98 98  CO2 27 27  GLUCOSE 148* 105*  BUN 10 6  CREATININE 0.85 0.79  CALCIUM 8.9 9.6  MG -- 2.2   Basename 10/13/11 1426  AST 14  ALT 8  ALKPHOS 170*  BILITOT 0.3  PROT 7.7  ALBUMIN 3.3*   Basename 10/14/11 0106 10/13/11 1426  WBC 7.1 6.7  NEUTROABS -- 5.0  HGB 10.4* 11.3*  HCT 32.5* 35.3*  MCV 90.8 91.2  PLT 430* 463*   Basename 10/14/11 0105 10/13/11 1759 10/13/11 1426  TROPONINI <0.30 <0.30 <0.30   Basename 10/13/11 1426  TSH 1.782     10/13/2011 14:26  Pro B Natriuretic peptide (BNP) 1750.0 (H)    Radiology/Studies:  CXR pending   Assessment and Plan   1. 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 Atrial Fibrillation with controlled ventricular rate (new this admission) 3. Hypokalemia 4. Hypertension 5. Chronic Anemia on Fe 6. Chronic abdominal pain and constipation 7. Bladder prolapse, pending surgery  Diuresing well on 60mg  IV  Lasix BID. I/Os net (-) 1.2L. Weight documented as up 3lbs. Difficult to assess for excess volume on exam due to body habitus. She reports her breathing is the same. Does not appear to be in any respiratory distress in bed. CXR pending. Initiated on Lisinopril yesterday. BP stable. Crt stable. Cont Lasix, BB, ACEI. K+ low, will replete. Troponin normal x3. Remains in atrial fibrillation, rates 60-80s with some drops in the 30-40s over night (asymptomatic). On heparin drip with plans for initiation of long-term anticoagulation with coumadin or novel anticoagulant. She has chronic anemia on Fe supplementation. Hgb 10.4. No signs of active bleeding. No BM for ~2 days. Cont Bisacodyl and add  senna. Alk phos 170 (176 in June). ALT/AST normal. No nausea or vomiting. No leukocytosis. Diffuse abdominal tenderness. Consider amylase/lipase.   Signed, HOPE, JESSICA PA-C  History and all data above reviewed.  Patient examined.  I agree with the findings as above.  She is breathing OK.  However, she has abdominal pain.  She says this has been going on for awhile and she thinks it is the bladder prolapse.  She says she saw a doctor about getting this fixed but she cannot give me any details.  The patient exam reveals [id number]  ,  Lungs: Clear  ,  Abd: Nonacute, no rebound no guarding.  Bowel sounds normal, Ext No edema  .  All available labs, radiology testing, previous records reviewed. Agree with documented assessment and plan. I would like to ambulate her and watch her oxygen sats.  I hope she will only need a couple of days in the hospital.  She will need anticoagulation.  Pradaxa would be OK.  I will check stool guaiac first.    [name]  9:00 AM  10/14/2011

## 2011-10-14 NOTE — Progress Notes (Signed)
ANTICOAGULATION CONSULT NOTE - Follow Up Consult  Labs:  Basename 10/14/11 1110 10/14/11 0112 10/14/11 0106 10/14/11 0105 10/13/11 2103 10/13/11 1759 10/13/11 1426  HGB -- -- 10.4* -- -- -- 11.3*  HCT -- -- 32.5* -- -- -- 35.3*  PLT -- -- 430* -- -- -- 463*  APTT -- -- -- -- -- -- 40*  LABPROT -- -- -- -- -- -- 14.8  INR -- -- -- -- -- -- 1.14  HEPARINUNFRC 0.26* 0.10* -- -- 0.16* -- --  CREATININE -- -- 0.85 -- -- -- 0.79  CKTOTAL -- -- -- -- -- -- --  CKMB -- -- -- -- -- -- --  TROPONINI -- -- -- <0.30 -- <0.30 <0.30    Assessment: 77 yo female continues to be slightly subtherapeutic on heparin with initial dosing for Afib. No bleeding noted.  Goal of Therapy:  Heparin level 0.3-0.7 units/ml   Plan:  Increase heparin to 1800 units/hr and check level in 8 hrs.  Christoper Fabian, PharmD, BCPS Clinical pharmacist, pager [phone] 10/14/2011,1:53 PM

## 2011-10-14 NOTE — Progress Notes (Signed)
ANTICOAGULATION CONSULT NOTE - Follow Up Consult  Pharmacy Consult for Heparin Indication: atrial fibrillation  Allergies  Allergen Reactions  . Penicillins Other (See Comments)    unknown  . Tetanus-Diphtheria Toxoids Td Other (See Comments)    unknown  . Latex Hives and Rash    Patient Measurements: Height: 5\' 1"  (154.9 cm) Weight: 261 lb 3.2 oz (118.48 kg) (bed) IBW/kg (Calculated) : 47.8  Heparin Dosing Weight: 77 kg  Vital Signs: Temp: 98.8 F (37.1 C) (09/05 2059) Temp src: Oral (09/05 2059) BP: 111/69 mmHg (09/05 2059) Pulse Rate: 83  (09/05 2059)  Labs:  Basename 10/14/11 2218 10/14/11 1110 10/14/11 0112 10/14/11 0106 10/14/11 0105 10/13/11 1759 10/13/11 1426  HGB -- -- -- 10.4* -- -- 11.3*  HCT -- -- -- 32.5* -- -- 35.3*  PLT -- -- -- 430* -- -- 463*  APTT -- -- -- -- -- -- 40*  LABPROT -- -- -- -- -- -- 14.8  INR -- -- -- -- -- -- 1.14  HEPARINUNFRC 0.74* 0.26* 0.10* -- -- -- --  CREATININE -- -- -- 0.85 -- -- 0.79  CKTOTAL -- -- -- -- -- -- --  CKMB -- -- -- -- -- -- --  TROPONINI -- -- -- -- <0.30 <0.30 <0.30    Estimated Creatinine Clearance: 68.7 ml/min (by C-G formula based on Cr of 0.85).   Medications:  Infusions:     . heparin 1,800 Units/hr (10/14/11 2233)   Assessment: 77 y.o. F on heparin for new-onset Afib. Heparin level (0.74) is slightly above-goal on 1800 units/hr. Confirmed with RN that heparin level was drawn appropriately.   Goal of Therapy:  Heparin level 0.3-0.7 units/ml Monitor platelets by anticoagulation protocol: Yes   Plan:  1. Decrease IV heparin to 1700 units/hr.  2. Heparin level in 8 hours.   Lorre Munroe, PharmD 10/14/2011 11:08 PM

## 2011-10-15 DIAGNOSIS — R109 Unspecified abdominal pain: Secondary | ICD-10-CM

## 2011-10-15 DIAGNOSIS — N8110 Cystocele, unspecified: Secondary | ICD-10-CM

## 2011-10-15 DIAGNOSIS — E876 Hypokalemia: Secondary | ICD-10-CM

## 2011-10-15 DIAGNOSIS — D509 Iron deficiency anemia, unspecified: Secondary | ICD-10-CM

## 2011-10-15 DIAGNOSIS — I4891 Unspecified atrial fibrillation: Principal | ICD-10-CM

## 2011-10-15 LAB — CBC
HCT: 36.9 % (ref 36.0–46.0)
MCH: 29.4 pg (ref 26.0–34.0)
MCHC: 32.2 g/dL (ref 30.0–36.0)
MCV: 91.1 fL (ref 78.0–100.0)
Platelets: 486 10*3/uL — ABNORMAL HIGH (ref 150–400)
RDW: 13.9 % (ref 11.5–15.5)
WBC: 6.4 10*3/uL (ref 4.0–10.5)

## 2011-10-15 LAB — BASIC METABOLIC PANEL
CO2: 26 mEq/L (ref 19–32)
Calcium: 9.3 mg/dL (ref 8.4–10.5)
Chloride: 104 mEq/L (ref 96–112)
GFR calc Af Amer: 78 mL/min — ABNORMAL LOW (ref >90)
Sodium: 140 mEq/L (ref 135–145)

## 2011-10-15 MED ORDER — PATIENT'S GUIDE TO USING COUMADIN BOOK
Freq: Once | Status: AC
  Administered 2011-10-15: 1
  Filled 2011-10-15: qty 1

## 2011-10-15 MED ORDER — WARFARIN VIDEO
Freq: Once | Status: AC
  Administered 2011-10-15: 1

## 2011-10-15 MED ORDER — WARFARIN - PHARMACIST DOSING INPATIENT: Freq: Every day | Status: DC

## 2011-10-15 MED ORDER — WARFARIN SODIUM 7.5 MG PO TABS
7.5000 mg | ORAL_TABLET | Freq: Once | ORAL | Status: AC
  Administered 2011-10-15: 7.5 mg via ORAL
  Filled 2011-10-15: qty 1

## 2011-10-15 NOTE — Progress Notes (Addendum)
ANTICOAGULATION CONSULT NOTE - Follow Up Consult  Pharmacy Consult for coumadin Indication: atrial fibrillation  Allergies  Allergen Reactions  . Penicillins Other (See Comments)    unknown  . Tetanus-Diphtheria Toxoids Td Other (See Comments)    unknown  . Latex Hives and Rash    Patient Measurements: Height: 5\' 1"  (154.9 cm) Weight: 257 lb 1.6 oz (116.62 kg) (bedscale, pt is drowsy) IBW/kg (Calculated) : 47.8  Heparin Dosing Weight: 77 kg  Vital Signs: Temp: 97.6 F (36.4 C) (09/06 1341) Temp src: Oral (09/06 1341) BP: 144/66 mmHg (09/06 1341) Pulse Rate: 81  (09/06 1341)  Labs:  Alvira Philips 10/15/11 0950 10/15/11 0655 10/14/11 2218 10/14/11 1110 10/14/11 0106 10/14/11 0105 10/13/11 1759 10/13/11 1426  HGB -- 11.9* -- -- 10.4* -- -- --  HCT -- 36.9 -- -- 32.5* -- -- 35.3*  PLT -- 486* -- -- 430* -- -- 463*  APTT -- -- -- -- -- -- -- 40*  LABPROT -- -- -- -- -- -- -- 14.8  INR -- -- -- -- -- -- -- 1.14  HEPARINUNFRC 0.75* -- 0.74* 0.26* -- -- -- --  CREATININE -- 0.83 -- -- 0.85 -- -- 0.79  CKTOTAL -- -- -- -- -- -- -- --  CKMB -- -- -- -- -- -- -- --  TROPONINI -- -- -- -- -- <0.30 <0.30 <0.30    Estimated Creatinine Clearance: 69.6 ml/min (by C-G formula based on Cr of 0.83).   Medications:  Infusions:     . heparin 1,550 Units/hr (10/15/11 1620)   Assessment: 77 y.o. F on heparin for new-onset Afib. Now to transition to coumadin. Baseline INR ok. Heparin level came back therapeutic tonight.    Goal of Therapy:  Heparin level 0.3-0.7 units/ml Monitor platelets by anticoagulation protocol: Yes INR = 2-3   Plan:  1. Coumadin 7.5mg  PO x1 2. Daily PT/INR 3. Cont heparin at 1550 units/hr

## 2011-10-15 NOTE — Progress Notes (Signed)
Patient Name: [name] Date of Encounter: 10/15/2011   Principal Problem:  *Acute on chronic combined systolic and diastolic congestive heart failure Active Problems:  Atrial fibrillation  HYPERTENSION  Abdominal pain  Bladder prolapse, female, acquired  Hypokalemia  Iron deficiency anemia   SUBJECTIVE  Breathing about the same.  Weight down 4 lbs.  Still w/ diffuse left sided abdominal discomfort - says it's chronic.  No chest pain/palps.  CURRENT MEDS    . amLODipine  10 mg Oral Daily  . aspirin EC  81 mg Oral Daily  . bisacodyl  5 mg Oral Daily  . carvedilol  3.125 mg Oral BID WC  . ferrous fumarate  1 tablet Oral BID  . furosemide  60 mg Intravenous BID  . lisinopril  5 mg Oral Daily  . potassium chloride  20 mEq Oral TID  . senna  1 tablet Oral QHS  . sodium chloride  3 mL Intravenous Q12H   OBJECTIVE  Filed Vitals:   10/14/11 1856 10/14/11 2059 10/15/11 0616 10/15/11 1003  BP: 113/66 111/69 123/71 119/68  Pulse: 80 83 65   Temp:  98.8 F (37.1 C) 99.5 F (37.5 C)   TempSrc:  Oral Oral   Resp:  20 18   Height:      Weight:   257 lb 1.6 oz (116.62 kg)   SpO2:  97% 94%     Intake/Output Summary (Last 24 hours) at 10/15/11 1016 Last data filed at 10/15/11 0700  Gross per 24 hour  Intake 1171.2 ml  Output   1400 ml  Net -228.8 ml   Filed Weights   10/13/11 1200 10/14/11 0522 10/15/11 0616  Weight: 258 lb 8 oz (117.255 kg) 261 lb 3.2 oz (118.48 kg) 257 lb 1.6 oz (116.62 kg)   PHYSICAL EXAM  General: Pleasant, NAD. Neuro: Alert and oriented X 3. Moves all extremities spontaneously. Psych: Normal affect. HEENT:  Normal  Neck: Supple without bruits.  JVP mildly elevated. Lungs:  Resp regular and unlabored, CTA. Heart: irreg, irreg no s3, s4, or murmurs. Abdomen: Soft, non-tender, non-distended, BS + x 4.  Extremities: No clubbing, cyanosis.  Trace bilat LE edema - lower legs are very tender.Marland Kitchen DP/PT/Radials 1+ and equal bilaterally.  Accessory  Clinical Findings  CBC  Basename 10/15/11 0655 10/14/11 0106 10/13/11 1426  WBC 6.4 7.1 --  NEUTROABS -- -- 5.0  HGB 11.9* 10.4* --  HCT 36.9 32.5* --  MCV 91.1 90.8 --  PLT 486* 430* --   Basic Metabolic Panel  Basename 10/15/11 0655 10/14/11 0106 10/13/11 1426  NA 140 134* --  K 4.0 3.2* --  CL 104 98 --  CO2 26 27 --  GLUCOSE 115* 148* --  BUN 7 10 --  CREATININE 0.83 0.85 --  CALCIUM 9.3 8.9 --  MG -- -- 2.2  PHOS -- -- --   Liver Function Tests  Basename 10/13/11 1426  AST 14  ALT 8  ALKPHOS 170*  BILITOT 0.3  PROT 7.7  ALBUMIN 3.3*   Cardiac Enzymes  Basename 10/14/11 0105 10/13/11 1759 10/13/11 1426  CKTOTAL -- -- --  CKMB -- -- --  CKMBINDEX -- -- --  TROPONINI <0.30 <0.30 <0.30   Thyroid Function Tests  Basename 10/13/11 1426  TSH 1.782  T4TOTAL --  T3FREE --  THYROIDAB --   TELE  afib 70's - 80's.  Radiology/Studies  Dg Chest 2 View  10/14/2011  *RADIOLOGY REPORT*  Clinical Data: Dyspnea.  CHF.  Shortness of breath.  CHEST - 2 VIEW  IMPRESSION: Cardiomegaly with development of mild pulmonary venous congestion.   Original Report Authenticated By: Consuello Bossier, M.D.    ASSESSMENT AND PLAN  1.  Afib:  Rate controlled on bb therapy.  No further signif brady.  She remains on heparin.  H/H are up this AM w/o intervention (other than diuresis).  Mildly dilated LA on last echo.  ? DCCV @ some point.  Not currently on oral anticoagulant.  Note previous discussion re: pradaxa as an option.  It appears that pt has medicare only.  Without supplemental pharmacy coverage, she would fall into doughnut hole rather quickly on pradaxa.  Will ask CM to review.  2.  Acute on chronic mixed systolic/diastolic chf:  Volume improved.  Weight is down some from yesterday.  She still appears to have mild volume overload on exam.  BUN/Creat stable.  Cont IV lasix today.  Cont bb/acei.  HR/BP well controlled.  Will ask PT to see as she appears to be fairly  deconditioned.  3.  HTN:  Stable. Cont bb/acei/ccb.  4.  Anemia:  Stable/improved.    5.  Bladder prolapse:  Pending surgery.  Will have to come off anticoagulation peri-op.  6.  Hypokalemia:  Improved.  Signed, Nicolasa Ducking NP  History and all data above reviewed.  Patient examined.  I agree with the findings as above.  The patient exam reveals COR: Irregular  ,  Lungs: Clear  ,  Abd: Positive bowel sounds, no rebound no guarding, Ext Mild edema  .  All available labs, radiology testing, previous records reviewed. Agree with documented assessment and plan. I will start warfarin given financials.  If we find out that she can afford Pradaxa in the future I will switch.  Probably home this weekend.   Fayrene Fearing Deklen Popelka  5:01 PM  10/15/2011

## 2011-10-15 NOTE — Progress Notes (Signed)
ANTICOAGULATION CONSULT NOTE - Follow Up Consult  Pharmacy Consult for Heparin Indication: atrial fibrillation  Allergies  Allergen Reactions  . Penicillins Other (See Comments)    unknown  . Tetanus-Diphtheria Toxoids Td Other (See Comments)    unknown  . Latex Hives and Rash    Patient Measurements: Height: 5\' 1"  (154.9 cm) Weight: 257 lb 1.6 oz (116.62 kg) (bedscale, pt is drowsy) IBW/kg (Calculated) : 47.8  Heparin Dosing Weight: 77 kg  Vital Signs: Temp: 99.5 F (37.5 C) (09/06 0616) Temp src: Oral (09/06 0616) BP: 119/68 mmHg (09/06 1003) Pulse Rate: 65  (09/06 0616)  Labs:  Alvira Philips 10/15/11 0950 10/15/11 0655 10/14/11 2218 10/14/11 1110 10/14/11 0106 10/14/11 0105 10/13/11 1759 10/13/11 1426  HGB -- 11.9* -- -- 10.4* -- -- --  HCT -- 36.9 -- -- 32.5* -- -- 35.3*  PLT -- 486* -- -- 430* -- -- 463*  APTT -- -- -- -- -- -- -- 40*  LABPROT -- -- -- -- -- -- -- 14.8  INR -- -- -- -- -- -- -- 1.14  HEPARINUNFRC 0.75* -- 0.74* 0.26* -- -- -- --  CREATININE -- 0.83 -- -- 0.85 -- -- 0.79  CKTOTAL -- -- -- -- -- -- -- --  CKMB -- -- -- -- -- -- -- --  TROPONINI -- -- -- -- -- <0.30 <0.30 <0.30    Estimated Creatinine Clearance: 69.6 ml/min (by C-G formula based on Cr of 0.83).   Medications:  Infusions:     . heparin 1,700 Units/hr (10/14/11 2357)   Assessment: 77 y.o. F on heparin for new-onset Afib. Heparin level (0.75) remains slightly above-goal on 1700 units/hr. No bleeding noted.  Goal of Therapy:  Heparin level 0.3-0.7 units/ml Monitor platelets by anticoagulation protocol: Yes   Plan:  1. Decrease IV heparin to 1550 units/hr.  2. Heparin level in 8 hours.  3. F/u plans for longterm oral anticoagulation  Christoper Fabian, PharmD, BCPS Clinical pharmacist, pager [phone] 10/15/2011 11:12 AM

## 2011-10-15 NOTE — Care Management Note (Signed)
    Page 1 of 2   10/21/2011     1:43:53 PM   CARE MANAGEMENT NOTE 10/21/2011  Patient:  [name], [name]   Account Number:  [id number]  Date Initiated:  10/15/2011  Documentation initiated by:  Tera Mater  Subjective/Objective Assessment:   77yo female admitted with CHF.  Pt. lives alone, however has assistance from daughter, [name].     Action/Plan:   Discharge planning.  pt. has assistance from a nurse's aide 2hr a day/ 6days a week.   Anticipated DC Date:  10/17/2011   Anticipated DC Plan:  HOME W HOME HEALTH SERVICES      DC Planning Services  CM consult  Medication Assistance      [facility] Choice  HOME HEALTH   Choice offered to / List presented to:  C-1 Patient        HH arranged  HH-1 RN  HH-10 DISEASE MANAGEMENT      HH agency  [facility]   Status of service:  In process, will continue to follow Medicare Important Message given?   (If response is "NO", the following Medicare IM given date fields will be blank) Date Medicare IM given:   Date Additional Medicare IM given:    Discharge Disposition:    Per UR Regulation:  Reviewed for med. necessity/level of care/duration of stay  If discussed at Long Length of Stay Meetings, dates discussed:   10/19/2011  10/21/2011    Comments:  [name] dtr:  [phone]   10/21/11 1340 Tera Mater, RN, BSN NCM [phone] In to speak with pt. about [facility] as well as giving list to pt. Pt. chose [facility] for [facility] RN for HF management. TC to Hilda Lias, with [facility], to give referral.  Anticipate discharge tomorrow.   10/15/11 1650 Tera Mater, RN, BSN NCM [phone] In to speak with pt. about [facility] needs as well as medication assistance for Pradaxa.  Pt. states she uses [employer].  Will place a benefits check to see if pt. insurance will cover Pradaxa.  Pt. states she would be interested in having HH.  NCM to follow.

## 2011-10-16 LAB — BASIC METABOLIC PANEL
BUN: 8 mg/dL (ref 6–23)
BUN: 8 mg/dL (ref 6–23)
CO2: 25 mEq/L (ref 19–32)
CO2: 24 mEq/L (ref 19–32)
Calcium: 9.4 mg/dL (ref 8.4–10.5)
Chloride: 97 mEq/L (ref 96–112)
Creatinine, Ser: 0.78 mg/dL (ref 0.50–1.10)
GFR calc Af Amer: 82 mL/min — ABNORMAL LOW (ref >90)
GFR calc non Af Amer: 80 mL/min — ABNORMAL LOW (ref >90)
GFR calc non Af Amer: 70 mL/min — ABNORMAL LOW (ref >90)
Glucose, Bld: 103 mg/dL — ABNORMAL HIGH (ref 70–99)
Glucose, Bld: 167 mg/dL — ABNORMAL HIGH (ref 70–99)

## 2011-10-16 LAB — CBC
Hemoglobin: 11.1 g/dL — ABNORMAL LOW (ref 12.0–15.0)
MCH: 29.5 pg (ref 26.0–34.0)
MCHC: 32.7 g/dL (ref 30.0–36.0)
MCHC: 32.5 g/dL (ref 30.0–36.0)
MCV: 90.2 fL (ref 78.0–100.0)
Platelets: 454 10*3/uL — ABNORMAL HIGH (ref 150–400)
Platelets: 473 10*3/uL — ABNORMAL HIGH (ref 150–400)
RBC: 3.69 MIL/uL — ABNORMAL LOW (ref 3.87–5.11)

## 2011-10-16 LAB — PRO B NATRIURETIC PEPTIDE: Pro B Natriuretic peptide (BNP): 1078 pg/mL — ABNORMAL HIGH (ref 0–450)

## 2011-10-16 LAB — HEPARIN LEVEL (UNFRACTIONATED): Heparin Unfractionated: 0.39 IU/mL (ref 0.30–0.70)

## 2011-10-16 MED ORDER — WARFARIN SODIUM 7.5 MG PO TABS
7.5000 mg | ORAL_TABLET | Freq: Once | ORAL | Status: AC
  Administered 2011-10-16: 7.5 mg via ORAL
  Filled 2011-10-16: qty 1

## 2011-10-16 NOTE — Evaluation (Signed)
Physical Therapy Evaluation Patient Details Name: [name] MRN: [id number] DOB: 02-20-1934 Today's Date: 10/16/2011 Time: 4098-1191 PT Time Calculation (min): 12 min  PT Assessment / Plan / Recommendation Clinical Impression  Patient s/p CHF with decr mobility secondary to decr endurance for activity.  Patient needs cues for postural stability and for sequencing steps and RW.  Will benefit from continued PT.      PT Assessment  Patient needs continued PT services    Follow Up Recommendations  Home health PT;Supervision - Intermittent    Barriers to Discharge        Equipment Recommendations  None recommended by PT    Recommendations for Other Services     Frequency Min 3X/week    Precautions / Restrictions Precautions Precautions: Fall Restrictions Weight Bearing Restrictions: No   Pertinent Vitals/Pain VSS, No pain      Mobility  Bed Mobility Bed Mobility: Not assessed Transfers Transfers: Sit to Stand;Stand to Sit Sit to Stand: 4: Min assist;With upper extremity assist;With armrests;From chair/3-in-1 Stand to Sit: 4: Min assist;With upper extremity assist;With armrests;To chair/3-in-1 Details for Transfer Assistance: cues for hand placement,  pt used momentum to perform sit to stand. Ambulation/Gait Ambulation/Gait Assistance: 4: Min assist Ambulation Distance (Feet): 30 Feet Assistive device: Rolling walker Ambulation/Gait Assistance Details: PAtient ambulates with flexed posture.  Slow gait.  Cues needed to stay close to RW.   Gait Pattern: Step-through pattern;Decreased stride length;Trunk flexed;Wide base of support Gait velocity: decreased gait speed Stairs: No Wheelchair Mobility Wheelchair Mobility: No         PT Diagnosis: Generalized weakness  PT Problem List: Decreased activity tolerance;Decreased balance;Decreased mobility;Decreased safety awareness;Decreased knowledge of use of DME PT Treatment Interventions: DME instruction;Gait  training;Functional mobility training;Therapeutic exercise;Therapeutic activities;Balance training;Patient/family education   PT Goals Acute Rehab PT Goals PT Goal Formulation: With patient Time For Goal Achievement: 10/23/11 Potential to Achieve Goals: Good Pt will go Supine/Side to Sit: Independently PT Goal: Supine/Side to Sit - Progress: Goal set today Pt will go Sit to Stand: Independently PT Goal: Sit to Stand - Progress: Goal set today Pt will Transfer Bed to Chair/Chair to Bed: Independently PT Transfer Goal: Bed to Chair/Chair to Bed - Progress: Goal set today Pt will Ambulate: 51 - 150 feet;with modified independence;with least restrictive assistive device PT Goal: Ambulate - Progress: Goal set today  Visit Information  Last PT Received On: 10/16/11 Assistance Needed: +1    Subjective Data  Subjective: "I will try to walk." Patient Stated Goal: To go home   Prior Functioning  Home Living Lives With: Alone Available Help at Discharge: Available PRN/intermittently (daughter assists at times) Type of Home: Apartment Home Access: Level entry Home Layout: One level Bathroom Shower/Tub: Engineer, manufacturing systems: Standard Home Adaptive Equipment: Bedside commode/3-in-1;Wheelchair - manual;Walker - rolling;Tub transfer bench Prior Function Level of Independence: Independent with assistive device(s) Able to Take Stairs?: Yes Driving: No Vocation: Retired Musician: No difficulties    Cognition  Overall Cognitive Status: Appears within functional limits for tasks assessed/performed Arousal/Alertness: Awake/alert Orientation Level: Appears intact for tasks assessed Behavior During Session: [facility] for tasks performed    Extremity/Trunk Assessment Right Upper Extremity Assessment RUE ROM/Strength/Tone: [facility] for tasks assessed Left Upper Extremity Assessment LUE ROM/Strength/Tone: [facility] for tasks assessed Right Lower Extremity Assessment RLE  ROM/Strength/Tone: [facility] for tasks assessed Left Lower Extremity Assessment LLE ROM/Strength/Tone: WFL for tasks assessed   Balance    End of Session PT - End of Session Equipment Utilized During Treatment:  Gait belt Activity Tolerance: Patient tolerated treatment well Patient left: with call bell/phone within reach (on toilet) Nurse Communication: Mobility status (pt left on toilet, to pull call string)       INGOLD,Santhiago Collingsworth 10/16/2011, 1:29 PM  [facility] Acute Rehabilitation [phone] (pager)

## 2011-10-16 NOTE — Progress Notes (Signed)
Subjective: Complains of L flank and L leg pain  Chronic.  No CP  Breathing is OK Objective: Filed Vitals:   10/15/11 1003 10/15/11 1341 10/15/11 2102 10/16/11 0408  BP: 119/68 144/66 134/83 114/74  Pulse:  81 96 89  Temp:  97.6 F (36.4 C) 98 F (36.7 C) 98.2 F (36.8 C)  TempSrc:  Oral Oral Axillary  Resp:  20 20 18   Height:      Weight:    259 lb 8 oz (117.708 kg)  SpO2:  96% 93% 93%   Weight change: 2 lb 6.4 oz (1.089 kg)  Intake/Output Summary (Last 24 hours) at 10/16/11 0908 Last data filed at 10/16/11 0618  Gross per 24 hour  Intake 847.15 ml  Output   1501 ml  Net -653.85 ml    General: Alert, awake, oriented x3, in no acute distress Neck:  JVP is normal Heart: Regular rate and rhythm, without murmurs, rubs, gallops.  Lungs: Clear to auscultation.  No rales or wheezes. Exemities:  No edema.   Neuro: Grossly intact, nonfocal.   Lab Results: Results for orders placed during the hospital encounter of 10/13/11 (from the past 24 hour(s))  HEPARIN LEVEL (UNFRACTIONATED)     Status: Abnormal   Collection Time   10/15/11  9:50 AM      Component Value Range   Heparin Unfractionated 0.75 (*) 0.30 - 0.70 IU/mL  HEPARIN LEVEL (UNFRACTIONATED)     Status: Normal   Collection Time   10/15/11  8:45 PM      Component Value Range   Heparin Unfractionated 0.35  0.30 - 0.70 IU/mL  BASIC METABOLIC PANEL     Status: Abnormal   Collection Time   10/16/11  6:10 AM      Component Value Range   Sodium 134 (*) 135 - 145 mEq/L   Potassium 3.5  3.5 - 5.1 mEq/L   Chloride 98  96 - 112 mEq/L   CO2 25  19 - 32 mEq/L   Glucose, Bld 103 (*) 70 - 99 mg/dL   BUN 8  6 - 23 mg/dL   Creatinine, Ser 1.61  0.50 - 1.10 mg/dL   Calcium 9.4  8.4 - 09.6 mg/dL   GFR calc non Af Amer 80 (*) >90 mL/min   GFR calc Af Amer >90  >90 mL/min  CBC     Status: Abnormal   Collection Time   10/16/11  6:10 AM      Component Value Range   WBC 6.8  4.0 - 10.5 K/uL   RBC 3.69 (*) 3.87 - 5.11 MIL/uL   Hemoglobin 10.9 (*) 12.0 - 15.0 g/dL   HCT 04.5 (*) 40.9 - 81.1 %   MCV 90.2  78.0 - 100.0 fL   MCH 29.5  26.0 - 34.0 pg   MCHC 32.7  30.0 - 36.0 g/dL   RDW 91.4  78.2 - 95.6 %   Platelets 454 (*) 150 - 400 K/uL  HEPARIN LEVEL (UNFRACTIONATED)     Status: Normal   Collection Time   10/16/11  6:10 AM      Component Value Range   Heparin Unfractionated 0.39  0.30 - 0.70 IU/mL  PROTIME-INR     Status: Normal   Collection Time   10/16/11  6:10 AM      Component Value Range   Prothrombin Time 14.9  11.6 - 15.2 seconds   INR 1.15  0.00 - 1.49    Studies/Results: @RISRSLT24 @  Medications: Reviewed.  Patient Active Hospital Problem List: Acute on chronic combined systolic and diastolic congestive heart failure (07/22/2011)   Assessment: Volume is improving  I would check BNP  Switch to PO tomorrow.   HYPERTENSION (02/20/2009)   Assessment: Controlled.    Abdominal pain (10/15/2011)   Assessment: Chronic.  Had BM yesterday.  Follow.    Atrial fibrillation (10/15/2011)   Assessment: rates controlled in 80s.  On b blocker.  On heparin. Coumadin started per pharmacy.  Wll continue overlap    LOS: 3 days   Dietrich Pates 10/16/2011, 9:08 AM

## 2011-10-16 NOTE — Progress Notes (Signed)
ANTICOAGULATION CONSULT NOTE - Follow Up Consult  Pharmacy Consult for coumadin/ufh Indication: atrial fibrillation  Allergies  Allergen Reactions  . Penicillins Other (See Comments)    unknown  . Tetanus-Diphtheria Toxoids Td Other (See Comments)    unknown  . Latex Hives and Rash    Patient Measurements: Height: 5\' 1"  (154.9 cm) Weight: 259 lb 8 oz (117.708 kg) (bed) IBW/kg (Calculated) : 47.8  Heparin Dosing Weight: 77 kg  Vital Signs: Temp: 98.2 F (36.8 C) (09/07 0408) Temp src: Axillary (09/07 0408) BP: 110/72 mmHg (09/07 1100) Pulse Rate: 89  (09/07 0408)  Labs:  Alvira Philips 10/16/11 0929 10/16/11 0610 10/15/11 2045 10/15/11 0950 10/15/11 0655 10/14/11 0105 10/13/11 1759 10/13/11 1426  HGB 11.1* 10.9* -- -- -- -- -- --  HCT 34.2* 33.3* -- -- 36.9 -- -- --  PLT 473* 454* -- -- 486* -- -- --  APTT -- -- -- -- -- -- -- 40*  LABPROT -- 14.9 -- -- -- -- -- 14.8  INR -- 1.15 -- -- -- -- -- 1.14  HEPARINUNFRC -- 0.39 0.35 0.75* -- -- -- --  CREATININE 0.80 0.78 -- -- 0.83 -- -- --  CKTOTAL -- -- -- -- -- -- -- --  CKMB -- -- -- -- -- -- -- --  TROPONINI -- -- -- -- -- <0.30 <0.30 <0.30    Estimated Creatinine Clearance: 72.7 ml/min (by C-G formula based on Cr of 0.8).   Medications:  Infusions:     . heparin 1,550 Units/hr (10/16/11 0618)   Assessment: 77 y.o. Female patient on heparin and coumadin for new-onset Afib. Xa level theraeptic, coumadin started yesterday. No bleeding reported.  Goal of Therapy:  Heparin level 0.3-0.7 units/ml Monitor platelets by anticoagulation protocol: Yes INR = 2-3   Plan:  1. Repeat Coumadin 7.5mg  PO x1 2. Daily PT/INR/Xa 3. Cont heparin at 1550 units/hr  Verlene Mayer, PharmD, [employer] Pager [phone]

## 2011-10-17 LAB — BASIC METABOLIC PANEL
BUN: 10 mg/dL (ref 6–23)
Calcium: 9.5 mg/dL (ref 8.4–10.5)
Creatinine, Ser: 0.87 mg/dL (ref 0.50–1.10)
GFR calc Af Amer: 74 mL/min — ABNORMAL LOW (ref >90)
GFR calc non Af Amer: 64 mL/min — ABNORMAL LOW (ref >90)
Potassium: 3.8 mEq/L (ref 3.5–5.1)

## 2011-10-17 LAB — CBC
MCV: 90.1 fL (ref 78.0–100.0)
Platelets: 434 10*3/uL — ABNORMAL HIGH (ref 150–400)
RBC: 3.54 MIL/uL — ABNORMAL LOW (ref 3.87–5.11)
WBC: 6.5 10*3/uL (ref 4.0–10.5)

## 2011-10-17 LAB — URINALYSIS, ROUTINE W REFLEX MICROSCOPIC
Bilirubin Urine: NEGATIVE
Hgb urine dipstick: NEGATIVE
Ketones, ur: NEGATIVE mg/dL
Nitrite: NEGATIVE
Urobilinogen, UA: 1 mg/dL (ref 0.0–1.0)

## 2011-10-17 LAB — PROTIME-INR
INR: 1.22 (ref 0.00–1.49)
Prothrombin Time: 15.7 seconds — ABNORMAL HIGH (ref 11.6–15.2)

## 2011-10-17 LAB — GLUCOSE, CAPILLARY: Glucose-Capillary: 96 mg/dL (ref 70–99)

## 2011-10-17 LAB — HEPARIN LEVEL (UNFRACTIONATED): Heparin Unfractionated: 0.16 IU/mL — ABNORMAL LOW (ref 0.30–0.70)

## 2011-10-17 MED ORDER — WARFARIN SODIUM 7.5 MG PO TABS
7.5000 mg | ORAL_TABLET | Freq: Once | ORAL | Status: AC
  Administered 2011-10-17: 7.5 mg via ORAL
  Filled 2011-10-17: qty 1

## 2011-10-17 MED ORDER — FUROSEMIDE 10 MG/ML IJ SOLN: 80.0000 mg | Freq: Once | INTRAMUSCULAR | Status: DC

## 2011-10-17 MED ORDER — FUROSEMIDE 80 MG PO TABS
80.0000 mg | ORAL_TABLET | Freq: Every day | ORAL | Status: DC
  Filled 2011-10-17: qty 1

## 2011-10-17 MED ORDER — FUROSEMIDE 10 MG/ML IJ SOLN
20.0000 mg | Freq: Once | INTRAMUSCULAR | Status: AC
  Administered 2011-10-17: 20 mg via INTRAVENOUS

## 2011-10-17 NOTE — Progress Notes (Signed)
Patient received 60 mg IV lasix.  I wrote 80 mg  Could not be modified as order.  Instructed nurse to give additional 20 mg. IV.

## 2011-10-17 NOTE — Progress Notes (Signed)
ANTICOAGULATION CONSULT NOTE - Follow Up Consult  Pharmacy Consult for coumadin/ufh Indication: atrial fibrillation  Allergies  Allergen Reactions  . Penicillins Other (See Comments)    unknown  . Tetanus-Diphtheria Toxoids Td Other (See Comments)    unknown  . Latex Hives and Rash    Patient Measurements: Height: 5\' 1"  (154.9 cm) Weight: 256 lb 6.3 oz (116.3 kg) (bed scale) IBW/kg (Calculated) : 47.8  Heparin Dosing Weight: 77 kg  Vital Signs: Temp: 98.9 F (37.2 C) (09/08 0627) Temp src: Oral (09/08 0627) BP: 106/52 mmHg (09/08 0827) Pulse Rate: 81  (09/08 0827)  Labs:  Alvira Philips 10/17/11 0525 10/16/11 0929 10/16/11 0610 10/15/11 2045 10/15/11 0655  HGB 10.3* 11.1* -- -- --  HCT 31.9* 34.2* 33.3* -- --  PLT 434* 473* 454* -- --  APTT -- -- -- -- --  LABPROT 15.7* -- 14.9 -- --  INR 1.22 -- 1.15 -- --  HEPARINUNFRC 0.16* -- 0.39 0.35 --  CREATININE -- 0.80 0.78 -- 0.83  CKTOTAL -- -- -- -- --  CKMB -- -- -- -- --  TROPONINI -- -- -- -- --    Estimated Creatinine Clearance: 72.1 ml/min (by C-G formula based on Cr of 0.8).   Medications:  Infusions:     . heparin 1,550 Units/hr (10/17/11 0640)   Assessment: 77 y.o. Female patient on heparin and coumadin for new-onset Afib. Xa level subtheraeptic, INR subtherapeutic but trending up. No bleeding reported.  Goal of Therapy:  Heparin level 0.3-0.7 units/ml Monitor platelets by anticoagulation protocol: Yes INR = 2-3   Plan:  1. Repeat Coumadin 7.5mg  PO x1 2. Daily PT/INR/Xa 3. increase heparin to 1850 units/hr 4. Check 8 hour heparin level.  Verlene Mayer, PharmD, BCPS Pager [phone]

## 2011-10-17 NOTE — Progress Notes (Signed)
ANTICOAGULATION CONSULT NOTE - Follow Up Consult  Pharmacy Consult for ufh Indication: atrial fibrillation  Allergies  Allergen Reactions  . Penicillins Other (See Comments)    unknown  . Tetanus-Diphtheria Toxoids Td Other (See Comments)    unknown  . Latex Hives and Rash    Patient Measurements: Height: 5\' 1"  (154.9 cm) Weight: 256 lb 6.3 oz (116.3 kg) (bed scale) IBW/kg (Calculated) : 47.8  Heparin Dosing Weight: 77 kg  Vital Signs: Temp: 97.9 F (36.6 C) (09/08 1459) Temp src: Tympanic (09/08 1459) BP: 101/64 mmHg (09/08 1459) Pulse Rate: 81  (09/08 1459)  Labs:  Basename 10/17/11 1858 10/17/11 1020 10/17/11 0525 10/16/11 0929 10/16/11 0610  HGB -- -- 10.3* 11.1* --  HCT -- -- 31.9* 34.2* 33.3*  PLT -- -- 434* 473* 454*  APTT -- -- -- -- --  LABPROT -- -- 15.7* -- 14.9  INR -- -- 1.22 -- 1.15  HEPARINUNFRC 0.47 -- 0.16* -- 0.39  CREATININE -- 0.87 -- 0.80 0.78  CKTOTAL -- -- -- -- --  CKMB -- -- -- -- --  TROPONINI -- -- -- -- --    Estimated Creatinine Clearance: 66.3 ml/min (by C-G formula based on Cr of 0.87).   Medications:  Infusions:     . heparin 1,850 Units/hr (10/17/11 1630)   Assessment: 77 y.o. Female patient on heparin and coumadin for new-onset Afib. Xa level theraeptic at current heparin rate.  Goal of Therapy:  Heparin level 0.3-0.7 units/ml Monitor platelets by anticoagulation protocol: Yes INR = 2-3   Plan:  Continue heparin gtt at 1850 units/hr and f/u in am.  Verlene Mayer, PharmD, BCPS Pager [phone]

## 2011-10-17 NOTE — Plan of Care (Signed)
Problem: Phase I Progression Outcomes Goal: EF % per last Echo/documented,Core Reminder form on chart Outcome: Progressing pts last EF 40% from 07/2011, pt DOE, oob x1 with walker, ambulates short distances, will continue to monitor Worthy Flank, RN

## 2011-10-17 NOTE — Progress Notes (Signed)
Subjective: Breathing is fair.  NO CP  BIggest complaint is L sided abdomenal pain and L leg pain  Had BM yesterday. Objective: Filed Vitals:   10/16/11 1400 10/16/11 2149 10/17/11 0627 10/17/11 0827  BP: 124/63 111/64 120/65 106/52  Pulse: 91 83  81  Temp: 98.4 F (36.9 C) 98.2 F (36.8 C) 98.9 F (37.2 C)   TempSrc: Oral Oral Oral   Resp: 18 18 18    Height:      Weight:   256 lb 6.3 oz (116.3 kg)   SpO2: 99% 96% 95%    Weight change: -3 lb 1.7 oz (-1.408 kg)  Intake/Output Summary (Last 24 hours) at 10/17/11 0855 Last data filed at 10/17/11 9604  Gross per 24 hour  Intake 977.16 ml  Output    402 ml  Net 575.16 ml    General: Alert, awake, oriented x3  NAD Neck:  JVP is normal Heart: Irregular rate and rhythm, without murmurs, rubs, gallops.  Lungs: Clear to auscultation anteriorly.  No rales or wheezes. Abd:  SUpple  Tender L side, L pelvic region.  No masses  No rebound. Exemities:  No edema.  L leg hurts with movement. Neuro: Grossly intact, nonfocal.  Tele:  Afib 80s. Lab Results: Results for orders placed during the hospital encounter of 10/13/11 (from the past 24 hour(s))  PRO B NATRIURETIC PEPTIDE     Status: Abnormal   Collection Time   10/16/11  9:29 AM      Component Value Range   Pro B Natriuretic peptide (BNP) 1078.0 (*) 0 - 450 pg/mL  BASIC METABOLIC PANEL     Status: Abnormal   Collection Time   10/16/11  9:29 AM      Component Value Range   Sodium 133 (*) 135 - 145 mEq/L   Potassium 3.7  3.5 - 5.1 mEq/L   Chloride 97  96 - 112 mEq/L   CO2 24  19 - 32 mEq/L   Glucose, Bld 167 (*) 70 - 99 mg/dL   BUN 8  6 - 23 mg/dL   Creatinine, Ser 5.40  0.50 - 1.10 mg/dL   Calcium 9.5  8.4 - 98.1 mg/dL   GFR calc non Af Amer 70 (*) >90 mL/min   GFR calc Af Amer 82 (*) >90 mL/min  CBC     Status: Abnormal   Collection Time   10/16/11  9:29 AM      Component Value Range   WBC 6.6  4.0 - 10.5 K/uL   RBC 3.77 (*) 3.87 - 5.11 MIL/uL   Hemoglobin 11.1 (*) 12.0 -  15.0 g/dL   HCT 19.1 (*) 47.8 - 29.5 %   MCV 90.7  78.0 - 100.0 fL   MCH 29.4  26.0 - 34.0 pg   MCHC 32.5  30.0 - 36.0 g/dL   RDW 62.1  30.8 - 65.7 %   Platelets 473 (*) 150 - 400 K/uL  CBC     Status: Abnormal   Collection Time   10/17/11  5:25 AM      Component Value Range   WBC 6.5  4.0 - 10.5 K/uL   RBC 3.54 (*) 3.87 - 5.11 MIL/uL   Hemoglobin 10.3 (*) 12.0 - 15.0 g/dL   HCT 84.6 (*) 96.2 - 95.2 %   MCV 90.1  78.0 - 100.0 fL   MCH 29.1  26.0 - 34.0 pg   MCHC 32.3  30.0 - 36.0 g/dL   RDW 84.1  32.4 - 40.1 %  Platelets 434 (*) 150 - 400 K/uL  HEPARIN LEVEL (UNFRACTIONATED)     Status: Abnormal   Collection Time   10/17/11  5:25 AM      Component Value Range   Heparin Unfractionated 0.16 (*) 0.30 - 0.70 IU/mL  PROTIME-INR     Status: Abnormal   Collection Time   10/17/11  5:25 AM      Component Value Range   Prothrombin Time 15.7 (*) 11.6 - 15.2 seconds   INR 1.22  0.00 - 1.49  GLUCOSE, CAPILLARY     Status: Normal   Collection Time   10/17/11  6:23 AM      Component Value Range   Glucose-Capillary 96  70 - 99 mg/dL   Comment 1 Notify RN      Studies/Results: @RISRSLT24 @  Medications:  Reviewed.   Patient Active Hospital Problem List: Acute on chronic combined systolic and diastolic congestive heart failure (07/22/2011)   Assessment: No net neg yesterday.  Has diuresed only 1.3 L since admit.    HYPERTENSION (02/20/2009)   Assessment: Good control  Hypokalemia (10/15/2011)   Assessment:  Abdominal pain (10/15/2011)   Assessment: Etiol is not clear.  Diffuse tenderness  Question gas  Afebrile  Will check UA    Iron deficiency anemia (10/15/2011)   Assessment: Stable  Atrial fibrillation (10/15/2011)   Assessment: Continue on rate control  ON heparin.  Switching to coumadin.  Should not need overlap.  Pharmacy following.  Bladder prolapse  Pending surgery.  LOS: 4 days   Dietrich Pates 10/17/2011, 8:55 AM

## 2011-10-18 ENCOUNTER — Telehealth: Payer: Self-pay | Admitting: Physician Assistant

## 2011-10-18 LAB — CBC
HCT: 32.9 % — ABNORMAL LOW (ref 36.0–46.0)
MCH: 29.7 pg (ref 26.0–34.0)
MCV: 89.6 fL (ref 78.0–100.0)
Platelets: 426 10*3/uL — ABNORMAL HIGH (ref 150–400)
RDW: 13.7 % (ref 11.5–15.5)

## 2011-10-18 MED ORDER — WARFARIN SODIUM 7.5 MG PO TABS
7.5000 mg | ORAL_TABLET | Freq: Once | ORAL | Status: AC
  Administered 2011-10-18: 7.5 mg via ORAL
  Filled 2011-10-18: qty 1

## 2011-10-18 MED ORDER — FUROSEMIDE 10 MG/ML IJ SOLN
60.0000 mg | Freq: Two times a day (BID) | INTRAMUSCULAR | Status: DC
  Administered 2011-10-18 – 2011-10-20 (×6): 60 mg via INTRAVENOUS
  Filled 2011-10-18 (×10): qty 6

## 2011-10-18 NOTE — Telephone Encounter (Signed)
s/w pt's daughter about pt calling and aasking for her surgery clearance to be faxed over. I explained that we admitted pt 10/13/11 and is not cleared for surgery yet and needs f/u, scheduled 10/27/11 9:50 w/SW same day [facility] is in the office 

## 2011-10-18 NOTE — Progress Notes (Signed)
    SUBJECTIVE: She does not feel well. Still has SOB. Also left flank pain that comes and goes.   BP 106/72  Pulse 78  Temp 99 F (37.2 C) (Oral)  Resp 20  Ht 5\' 1"  (1.549 m)  Wt 260 lb 9.3 oz (118.2 kg)  BMI 49.24 kg/m2  SpO2 94%  Intake/Output Summary (Last 24 hours) at 10/18/11 0919 Last data filed at 10/18/11 0617  Gross per 24 hour  Intake    960 ml  Output    775 ml  Net    185 ml    PHYSICAL EXAM General: Well developed, well nourished, in no acute distress. Alert and oriented x 3.  Psych:  Good affect, responds appropriately Neck: + JVD. No masses noted.  Lungs: Clear bilaterally faint bibasilar crackles. No rhonci or wheezing noted.  Heart: Irregular with no murmurs noted. Abdomen: Bowel sounds are present. Soft, non-tender.  Extremities: No lower extremity edema. Tenderness over both lower extremities to touch.   LABS: Basic Metabolic Panel:  Basename 10/17/11 1020 10/16/11 0929  NA 135 133*  K 3.8 3.7  CL 99 97  CO2 23 24  GLUCOSE 120* 167*  BUN 10 8  CREATININE 0.87 0.80  CALCIUM 9.5 9.5  MG -- --  PHOS -- --   CBC:  Basename 10/18/11 0610 10/17/11 0525  WBC 6.3 6.5  NEUTROABS -- --  HGB 10.9* 10.3*  HCT 32.9* 31.9*  MCV 89.6 90.1  PLT 426* 434*     Current Meds:    . amLODipine  10 mg Oral Daily  . aspirin EC  81 mg Oral Daily  . bisacodyl  5 mg Oral Daily  . carvedilol  3.125 mg Oral BID WC  . ferrous fumarate  1 tablet Oral BID  . furosemide  20 mg Intravenous Once  . furosemide  80 mg Oral Daily  . lisinopril  5 mg Oral Daily  . potassium chloride  20 mEq Oral TID  . senna  1 tablet Oral QHS  . sodium chloride  3 mL Intravenous Q12H  . warfarin  7.5 mg Oral ONCE-1800  . warfarin  7.5 mg Oral ONCE-1800  . Warfarin - Pharmacist Dosing Inpatient   Does not apply q1800  . DISCONTD: furosemide  80 mg Intravenous Once     ASSESSMENT AND PLAN:  1. Atrial fibrillation: Rate controlled on beta blocker therapy.  She remains on  heparin. Coumadin has been started. INR is 1.5 this am. H/H stable.    2. Acute on chronic mixed systolic/diastolic CHF: Weight is back up today. She is net positive for last 48 hours.  She still appears to have mild volume overload on exam. Restart  IV lasix today. Pt is requesting foley catheter placement for diuresis.  Cont bb/acei. HR/BP well controlled.   3. HTN: Stable. Cont bb/acei/ccb.   4. Anemia: Stable  5. Bladder prolapse: Pending surgery.      MCALHANY,CHRISTOPHER  9/9/20139:19 AM

## 2011-10-18 NOTE — Telephone Encounter (Signed)
s/w pt's daughter about pt calling and aasking for her surgery clearance to be faxed over. I explained that we admitted pt 10/13/11 and is not cleared for surgery yet and needs f/u, scheduled 10/27/11 9:50 w/SW same day [facility] is in the office

## 2011-10-18 NOTE — Progress Notes (Signed)
ANTICOAGULATION CONSULT NOTE - Follow Up Consult  Pharmacy Consult for Heparin and Coumadin Indication: atrial fibrillation  Allergies  Allergen Reactions  . Penicillins Other (See Comments)    unknown  . Tetanus-Diphtheria Toxoids Td Other (See Comments)    unknown  . Latex Hives and Rash    Patient Measurements: Height: 5\' 1"  (154.9 cm) Weight: 260 lb 9.3 oz (118.2 kg) IBW/kg (Calculated) : 47.8  Heparin Dosing Weight: 77kg  Vital Signs: Temp: 99 F (37.2 C) (09/09 0600) Temp src: Oral (09/09 0600) BP: 106/72 mmHg (09/09 0600) Pulse Rate: 78  (09/09 0600)  Labs:  Basename 10/18/11 0610 10/17/11 1858 10/17/11 1020 10/17/11 0525 10/16/11 0929 10/16/11 0610  HGB 10.9* -- -- 10.3* -- --  HCT 32.9* -- -- 31.9* 34.2* --  PLT 426* -- -- 434* 473* --  APTT -- -- -- -- -- --  LABPROT 18.6* -- -- 15.7* -- 14.9  INR 1.52* -- -- 1.22 -- 1.15  HEPARINUNFRC 0.49 0.47 -- 0.16* -- --  CREATININE -- -- 0.87 -- 0.80 0.78  CKTOTAL -- -- -- -- -- --  CKMB -- -- -- -- -- --  TROPONINI -- -- -- -- -- --    Estimated Creatinine Clearance: 67 ml/min (by C-G formula based on Cr of 0.87).   Medications:  Heparin 1850 units/hr  Assessment: 77yof on Heparin bridging to Coumadin for new onset Afib. Heparin level (0.49) is therapeutic - continue current rate. INR (1.52) is subtherapeutic but trending up appropriately with Coumadin 7.5mg  doses - will continue. - H/H and Plts stable - No significant bleeding reported  Goal of Therapy:  INR 2-3 Heparin level 0.3-0.7 units/ml Monitor platelets by anticoagulation protocol: Yes   Plan:  1. Continue Heparin 1850 units/hr (18.5 ml/hr) 2. Repeat Coumadin 7.5mg  po x 1 today 3. Follow-up AM INR, heparin level and discharge plans  Cleon Dew [phone] 10/18/2011,8:40 AM

## 2011-10-18 NOTE — Progress Notes (Signed)
Utilization Review Completed.  

## 2011-10-18 NOTE — Telephone Encounter (Signed)
Pt had surgical clearance 9-4, can they get it faxed today??

## 2011-10-19 LAB — BASIC METABOLIC PANEL
BUN: 14 mg/dL (ref 6–23)
Creatinine, Ser: 0.85 mg/dL (ref 0.50–1.10)
GFR calc non Af Amer: 65 mL/min — ABNORMAL LOW (ref >90)
Glucose, Bld: 98 mg/dL (ref 70–99)
Potassium: 4 mEq/L (ref 3.5–5.1)

## 2011-10-19 LAB — CBC
Hemoglobin: 10.5 g/dL — ABNORMAL LOW (ref 12.0–15.0)
MCH: 28.8 pg (ref 26.0–34.0)
MCHC: 32 g/dL (ref 30.0–36.0)
RDW: 13.8 % (ref 11.5–15.5)

## 2011-10-19 LAB — HEPARIN LEVEL (UNFRACTIONATED): Heparin Unfractionated: <0.1 IU/mL — ABNORMAL LOW (ref 0.30–0.70)

## 2011-10-19 MED ORDER — WARFARIN SODIUM 2.5 MG PO TABS
2.5000 mg | ORAL_TABLET | Freq: Once | ORAL | Status: AC
  Administered 2011-10-19: 2.5 mg via ORAL
  Filled 2011-10-19: qty 1

## 2011-10-19 MED ORDER — FUROSEMIDE 10 MG/ML IJ SOLN
INTRAMUSCULAR | Status: AC
  Filled 2011-10-19: qty 8

## 2011-10-19 NOTE — Progress Notes (Signed)
1100: Foley removed with no complications noted. Anastasia Fiedler RN.

## 2011-10-19 NOTE — Progress Notes (Signed)
ANTICOAGULATION CONSULT NOTE - Follow Up Consult  Pharmacy Consult for Heparin and Coumadin Indication: atrial fibrillation  Allergies  Allergen Reactions  . Penicillins Other (See Comments)    unknown  . Tetanus-Diphtheria Toxoids Td Other (See Comments)    unknown  . Latex Hives and Rash    Patient Measurements: Height: 5\' 1"  (154.9 cm) Weight: 255 lb 15.3 oz (116.1 kg) (bed scale/ patient refused to stand on scale) IBW/kg (Calculated) : 47.8  Heparin Dosing Weight: 77kg  Vital Signs: Temp: 98.6 F (37 C) (09/10 0420) Temp src: Oral (09/10 0420) BP: 126/68 mmHg (09/10 0420) Pulse Rate: 80  (09/10 0420)  Labs:  Basename 10/19/11 0640 10/18/11 0610 10/17/11 1858 10/17/11 1020 10/17/11 0525 10/16/11 0929  HGB 10.5* 10.9* -- -- -- --  HCT 32.8* 32.9* -- -- 31.9* --  PLT 390 426* -- -- 434* --  APTT -- -- -- -- -- --  LABPROT 26.0* 18.6* -- -- 15.7* --  INR 2.34* 1.52* -- -- 1.22 --  HEPARINUNFRC <0.10* 0.49 0.47 -- -- --  CREATININE 0.85 -- -- 0.87 -- 0.80  CKTOTAL -- -- -- -- -- --  CKMB -- -- -- -- -- --  TROPONINI -- -- -- -- -- --    Estimated Creatinine Clearance: 67.8 ml/min (by C-G formula based on Cr of 0.85).   Medications:  Heparin 1850 units/hr   Assessment: 77yof on Heparin bridging to Coumadin for new onset Afib. INR (2.34) is now therapeutic after significantly increasing overnight. Expect INR to continue to trend up from Coumadin 7.5mg  x 4 doses - will decrease dose and follow-up AM INR. Will discontinue heparin bridge now that INR is therapeutic - confirmed plan with Dr. Antoine Poche. - H/H and Plts trended down - No significant bleeding reported  Goal of Therapy:  INR 2-3 Monitor platelets by anticoagulation protocol: Yes   Plan:  1. Discontinue heparin drip, levels and protocol 2. Coumadin 2.5mg  po x 1 today 3. Follow-up AM INR   Cleon Dew [phone] 10/19/2011,9:24 AM

## 2011-10-19 NOTE — Progress Notes (Signed)
   SUBJECTIVE:  She continues to complain of left sided pain from the breast to the hip.  No chest pain.  She thinks that her breathing is OK   PHYSICAL EXAM Filed Vitals:   10/18/11 1435 10/18/11 1724 10/18/11 2107 10/19/11 0420  BP: 100/58 101/62 110/56 126/68  Pulse: 76 72 76 80  Temp: 98.5 F (36.9 C)  98.3 F (36.8 C) 98.6 F (37 C)  TempSrc: Oral  Oral Oral  Resp: 19  18 19   Height:      Weight:    255 lb 15.3 oz (116.1 kg)  SpO2: 93%  93% 97%   General:  No acute distress Lungs:  Clear Heart:  Irregular Abdomen:  Positive bowel sounds, no rebound no guarding Extremities:  Decreased edema  LABS: Lab Results  Component Value Date   CKTOTAL 174 07/20/2011   CKMB 2.0 07/20/2011   TROPONINI <0.30 10/14/2011   No results found for this or any previous visit (from the past 24 hour(s)).  Intake/Output Summary (Last 24 hours) at 10/19/11 0643 Last data filed at 10/19/11 0420  Gross per 24 hour  Intake    723 ml  Output   3025 ml  Net  -2302 ml      ASSESSMENT AND PLAN:  1. Atrial fibrillation: Rate controlled on beta blocker therapy. She remains on heparin. Coumadin has been started. INR is pending this am.  Dosing per pharmacy.   2. Acute on chronic mixed systolic/diastolic CHF: Restarted IV Lasix yesterday.  Good UO yesterday.  Wt down five pounds.  BMET pending.  Continue IV therapy today.  3. HTN: Controlled on current therapy.  4. Anemia: Stable   5. Bladder prolapse: Her daughter called for surgical clearance but it was explained to her that she could not be approved while acutely volume overloaded.  6. Abdominal pain:  This has been chronic.  There are no acute findings.  She cannot tell me whether she has seen a GI doctor recently although she has a history of diverticulosis, hemorrhoids and chronic constipation.  There is no evidence of GI bleeding and she seems to be tolerating the warfarin.         Fayrene Fearing [facility] 10/19/2011 6:43 AM

## 2011-10-19 NOTE — Progress Notes (Signed)
Physical Therapy Treatment Patient Details Name: [name] MRN: [id number] DOB: [birth year] Today's Date: 10/19/2011 Time: 1478-2956 PT Time Calculation (min): 20 min  PT Assessment / Plan / Recommendation Comments on Treatment Session  Patient s/p Afib and CHF with decr mobility secondary to decr endurance for activity.  Will benefit from continued PT to progress mobility.      Follow Up Recommendations  Home health PT;Supervision - Intermittent    Barriers to Discharge        Equipment Recommendations  None recommended by PT    Recommendations for Other Services    Frequency Min 3X/week   Plan Discharge plan remains appropriate;Frequency remains appropriate    Precautions / Restrictions Precautions Precautions: Fall Restrictions Weight Bearing Restrictions: No   Pertinent Vitals/Pain VSS, No pain    Mobility  Bed Mobility Bed Mobility: Not assessed Transfers Transfers: Sit to Stand;Stand to Sit Sit to Stand: With upper extremity assist;4: Min assist;From toilet Stand to Sit: 4: Min guard;With upper extremity assist;With armrests;To chair/3-in-1 Details for Transfer Assistance: cues for hand placement.  Patient widens BOS and uses momentum to get up. Ambulation/Gait Ambulation/Gait Assistance: 4: Min guard Ambulation Distance (Feet): 40 Feet Assistive device: Rolling walker Ambulation/Gait Assistance Details: Patient ambulates with slightly flexed posture.  Slow gait.  Cues needed to stay close to RW.   Gait Pattern: Step-through pattern;Decreased stride length;Trunk flexed;Wide base of support Gait velocity: decreased gait speed Stairs: No Wheelchair Mobility Wheelchair Mobility: No    Exercises General Exercises - Lower Extremity Long Arc Quad: AROM;Both;5 reps;Seated    PT Goals Acute Rehab PT Goals PT Goal: Sit to Stand - Progress: Progressing toward goal PT Transfer Goal: Bed to Chair/Chair to Bed - Progress: Progressing toward goal PT Goal: Ambulate -  Progress: Progressing toward goal  Visit Information  Last PT Received On: 10/19/11 Assistance Needed: +1    Subjective Data  Subjective: "I am not walking in the hall."   Cognition  Overall Cognitive Status: Appears within functional limits for tasks assessed/performed Arousal/Alertness: Awake/alert Orientation Level: Appears intact for tasks assessed Behavior During Session: [facility] for tasks performed    Balance  Static Standing Balance Static Standing - Balance Support: Bilateral upper extremity supported;During functional activity Static Standing - Level of Assistance: 5: Stand by assistance Static Standing - Comment/# of Minutes: 1 minute  End of Session PT - End of Session Equipment Utilized During Treatment: Gait belt Activity Tolerance: Patient limited by fatigue Patient left: in chair;with call bell/phone within reach Nurse Communication: Mobility status       INGOLD,[name] 10/19/2011, 2:19 PM  [facility] Acute Rehabilitation [phone] (pager)

## 2011-10-20 LAB — BASIC METABOLIC PANEL
BUN: 15 mg/dL (ref 6–23)
CO2: 25 mEq/L (ref 19–32)
Chloride: 98 mEq/L (ref 96–112)
Creatinine, Ser: 0.99 mg/dL (ref 0.50–1.10)

## 2011-10-20 LAB — CBC
HCT: 31.6 % — ABNORMAL LOW (ref 36.0–46.0)
Hemoglobin: 10.4 g/dL — ABNORMAL LOW (ref 12.0–15.0)
MCH: 29.8 pg (ref 26.0–34.0)
MCHC: 32.9 g/dL (ref 30.0–36.0)
RDW: 14 % (ref 11.5–15.5)

## 2011-10-20 LAB — PROTIME-INR: Prothrombin Time: 29.4 seconds — ABNORMAL HIGH (ref 11.6–15.2)

## 2011-10-20 MED ORDER — WARFARIN SODIUM 2.5 MG PO TABS
2.5000 mg | ORAL_TABLET | Freq: Once | ORAL | Status: AC
  Administered 2011-10-20: 2.5 mg via ORAL
  Filled 2011-10-20: qty 1

## 2011-10-20 NOTE — Progress Notes (Signed)
    SUBJECTIVE:  Abdominal pain which she reports as chronic and unchanged from pain that she has had worked up in the past.  No chest pain.  Her breathing is likely at baseline.  She ambulated in the room with PT.     PHYSICAL EXAM Filed Vitals:   10/19/11 1359 10/19/11 1657 10/19/11 2129 10/20/11 0551  BP: 115/64 110/75 137/80 125/58  Pulse: 77 83 87 86  Temp: 98.6 F (37 C)  97.2 F (36.2 C) 97 F (36.1 C)  TempSrc: Oral  Oral Oral  Resp: 18  18 18   Height:      Weight:    255 lb 11.7 oz (116 kg)  SpO2: 95%  98% 98%   General:  No acute distress Lungs:  Clear Heart:  Irregular Abdomen:  Positive bowel sounds, no rebound no guarding Extremities: No edema  LABS:  Results for orders placed during the hospital encounter of 10/13/11 (from the past 24 hour(s))  CBC     Status: Abnormal   Collection Time   10/20/11  5:00 AM      Component Value Range   WBC 5.8  4.0 - 10.5 K/uL   RBC 3.49 (*) 3.87 - 5.11 MIL/uL   Hemoglobin 10.4 (*) 12.0 - 15.0 g/dL   HCT 16.1 (*) 09.6 - 04.5 %   MCV 90.5  78.0 - 100.0 fL   MCH 29.8  26.0 - 34.0 pg   MCHC 32.9  30.0 - 36.0 g/dL   RDW 40.9  81.1 - 91.4 %   Platelets 413 (*) 150 - 400 K/uL    Intake/Output Summary (Last 24 hours) at 10/20/11 0646 Last data filed at 10/19/11 2116  Gross per 24 hour  Intake  738.5 ml  Output   1300 ml  Net -561.5 ml      ASSESSMENT AND PLAN:  1. Atrial fibrillation: Rate controlled on beta blocker therapy.  Coumadin is therapeutic.  Now off heparin.  Continue current therapy.  Of note the patient has received all of the warfarin education.     2. 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  UO somewhat less yesterday.  However, the foley is out and counts are suspect.  BMET pending.  Continue IV therapy today.  I would like to send her home tomorrow.    3. HTN: Controlled on current therapy.  4. Anemia: Stable   There has been no indication of active bleeding and  she is tolerating the warfarin.  I educated her again today about this medication.    5. Bladder prolapse:  The patient tells me today that she is not planning surgery at this time.   6. Abdominal pain:  This has been chronic.  There are no acute findings.  She has a history of diverticulosis, hemorrhoids and chronic constipation.  There is no evidence of GI bleeding and she seems to be tolerating the warfarin.     Fayrene Fearing [facility] 10/20/2011 6:46 AM

## 2011-10-20 NOTE — Progress Notes (Signed)
Pt. Converted from A-fib to NSR.  EKG obtained to confirm sinus rhythm. [place], PA notified. Will continue to monitor.

## 2011-10-20 NOTE — Progress Notes (Signed)
ANTICOAGULATION CONSULT NOTE - Follow Up Consult  Pharmacy Consult for Coumadin Indication: atrial fibrillation  Allergies  Allergen Reactions  . Penicillins Other (See Comments)    unknown  . Tetanus-Diphtheria Toxoids Td Other (See Comments)    unknown  . Latex Hives and Rash    Patient Measurements: Height: 5\' 1"  (154.9 cm) Weight: 255 lb 11.7 oz (116 kg) ( bed) IBW/kg (Calculated) : 47.8  Heparin Dosing Weight:   Vital Signs: Temp: 97 F (36.1 C) (09/11 0551) Temp src: Oral (09/11 0551) BP: 125/58 mmHg (09/11 0551) Pulse Rate: 86  (09/11 0551)  Labs:  Basename 10/20/11 0500 10/19/11 0640 10/18/11 0610 10/17/11 1858 10/17/11 1020  HGB 10.4* 10.5* -- -- --  HCT 31.6* 32.8* 32.9* -- --  PLT 413* 390 426* -- --  APTT -- -- -- -- --  LABPROT 29.4* 26.0* 18.6* -- --  INR 2.73* 2.34* 1.52* -- --  HEPARINUNFRC -- <0.10* 0.49 0.47 --  CREATININE 0.99 0.85 -- -- 0.87  CKTOTAL -- -- -- -- --  CKMB -- -- -- -- --  TROPONINI -- -- -- -- --    Estimated Creatinine Clearance: 58.2 ml/min (by C-G formula based on Cr of 0.99).  Assessment: 77yof continuing on Coumadin for new onset Afib. INR (2.73) is therapeutic and continues to trend up most likely due to the Coumadin 7.5mg  x 4 doses used to initiate patient. Will repeat Coumadin 2.5mg  dose tonight but expect patient to require ~5mg  daily once INR stabilized. - H/H and Plts stable - No significant bleeding reported  Goal of Therapy:  INR 2-3   Plan:  1. Repeat Coumadin 2.5mg  po x 1 today 2. Follow-up AM INR  Cleon Dew [phone] 10/20/2011,8:35 AM

## 2011-10-21 LAB — CBC
HCT: 32.6 % — ABNORMAL LOW (ref 36.0–46.0)
MCHC: 32.5 g/dL (ref 30.0–36.0)
MCV: 90.1 fL (ref 78.0–100.0)
RDW: 13.9 % (ref 11.5–15.5)

## 2011-10-21 LAB — BASIC METABOLIC PANEL
BUN: 18 mg/dL (ref 6–23)
CO2: 27 mEq/L (ref 19–32)
Calcium: 9.5 mg/dL (ref 8.4–10.5)
Creatinine, Ser: 0.96 mg/dL (ref 0.50–1.10)
Glucose, Bld: 96 mg/dL (ref 70–99)

## 2011-10-21 MED ORDER — WARFARIN SODIUM 5 MG PO TABS
5.0000 mg | ORAL_TABLET | Freq: Once | ORAL | Status: AC
  Administered 2011-10-21: 5 mg via ORAL
  Filled 2011-10-21: qty 1

## 2011-10-21 MED ORDER — FUROSEMIDE 20 MG PO TABS: 60.0000 mg | ORAL_TABLET | Freq: Two times a day (BID) | ORAL | Status: DC

## 2011-10-21 MED ORDER — FUROSEMIDE 40 MG PO TABS
60.0000 mg | ORAL_TABLET | Freq: Two times a day (BID) | ORAL | Status: DC
  Administered 2011-10-21 – 2011-10-23 (×5): 60 mg via ORAL
  Filled 2011-10-21 (×8): qty 1

## 2011-10-21 NOTE — Progress Notes (Signed)
ANTICOAGULATION CONSULT NOTE - Follow Up Consult  Pharmacy Consult for Coumadin Indication: atrial fibrillation  Allergies  Allergen Reactions  . Penicillins Other (See Comments)    unknown  . Tetanus-Diphtheria Toxoids Td Other (See Comments)    unknown  . Latex Hives and Rash    Patient Measurements: Height: 5\' 1"  (154.9 cm) Weight: 256 lb 6.3 oz (116.3 kg) (bed scale pt refused to get on scale) IBW/kg (Calculated) : 47.8  Heparin Dosing Weight:   Vital Signs: Temp: 98.9 F (37.2 C) (09/12 0922) Temp src: Oral (09/12 0922) BP: 120/60 mmHg (09/12 0922) Pulse Rate: 77  (09/12 0922)  Labs:  Basename 10/21/11 0510 10/20/11 0500 10/19/11 0640  HGB 10.6* 10.4* --  HCT 32.6* 31.6* 32.8*  PLT 425* 413* 390  APTT -- -- --  LABPROT 28.2* 29.4* 26.0*  INR 2.59* 2.73* 2.34*  HEPARINUNFRC -- -- <0.10*  CREATININE 0.96 0.99 0.85  CKTOTAL -- -- --  CKMB -- -- --  TROPONINI -- -- --    Estimated Creatinine Clearance: 60.1 ml/min (by C-G formula based on Cr of 0.96).  Assessment: 77yof continuing on Coumadin for new onset Afib. INR (2.59) remains therapeutic. Based on INR trends, expect discharge regimen to be ~5mg  daily. - H/H and Plts stable - No significant bleeding reported  Goal of Therapy:  INR 2-3    Plan:  1. Coumadin 5mg  po x 1 today 2. Follow-up AM INR and discharge plans  Cleon Dew [phone] 10/21/2011,11:57 AM

## 2011-10-21 NOTE — Progress Notes (Signed)
Nutrition Brief Note  Patient identified on the Malnutrition Screening Tool (MST) report for weight loss, generating a score of 2. Pt did not indicate unintentional weight loss or poor appetite. Pt admitted for IV diuretics. Admission weight 258 lbs, current weight 256 lbs.   Body mass index is 48.45 kg/(m^2). Pt meets criteria for morbid obesity based on current BMI.   Current diet order is heart healthy, 1.5 L fluid restriction, patient is consuming approximately 25-100% of meals at this time. Labs and medications reviewed.   No nutrition interventions warranted at this time. If nutrition issues arise, please consult RD.   Clarene Duke RD, LDN Pager [phone] After Hours pager [phone]

## 2011-10-21 NOTE — Progress Notes (Addendum)
    SUBJECTIVE:  Complains of "not feeling good"; dyspnea at baseline; complains of abdominal pain    PHYSICAL EXAM Filed Vitals:   10/20/11 0551 10/20/11 1430 10/20/11 2127 10/21/11 0504  BP: 125/58 110/52 106/64 120/57  Pulse: 86 86 93 83  Temp: 97 F (36.1 C) 99 F (37.2 C) 98.1 F (36.7 C) 99.1 F (37.3 C)  TempSrc: Oral Oral Oral Oral  Resp: 18 18 18 18   Height:      Weight: 255 lb 11.7 oz (116 kg)   256 lb 6.3 oz (116.3 kg)  SpO2: 98% 99% 100% 96%   General:  No acute distress Neck: supple Lungs:  Clear Heart:  RRR Abdomen:  Mild tenderness to palpation Extremities: No edema  LABS:  Results for orders placed during the hospital encounter of 10/13/11 (from the past 24 hour(s))  CBC     Status: Abnormal   Collection Time   10/21/11  5:10 AM      Component Value Range   WBC 5.6  4.0 - 10.5 K/uL   RBC 3.62 (*) 3.87 - 5.11 MIL/uL   Hemoglobin 10.6 (*) 12.0 - 15.0 g/dL   HCT 16.1 (*) 09.6 - 04.5 %   MCV 90.1  78.0 - 100.0 fL   MCH 29.3  26.0 - 34.0 pg   MCHC 32.5  30.0 - 36.0 g/dL   RDW 40.9  81.1 - 91.4 %   Platelets 425 (*) 150 - 400 K/uL  PROTIME-INR     Status: Abnormal   Collection Time   10/21/11  5:10 AM      Component Value Range   Prothrombin Time 28.2 (*) 11.6 - 15.2 seconds   INR 2.59 (*) 0.00 - 1.49  BASIC METABOLIC PANEL     Status: Abnormal   Collection Time   10/21/11  5:10 AM      Component Value Range   Sodium 134 (*) 135 - 145 mEq/L   Potassium 4.8  3.5 - 5.1 mEq/L   Chloride 98  96 - 112 mEq/L   CO2 27  19 - 32 mEq/L   Glucose, Bld 96  70 - 99 mg/dL   BUN 18  6 - 23 mg/dL   Creatinine, Ser 7.82  0.50 - 1.10 mg/dL   Calcium 9.5  8.4 - 95.6 mg/dL   GFR calc non Af Amer 56 (*) >90 mL/min   GFR calc Af Amer 65 (*) >90 mL/min    Intake/Output Summary (Last 24 hours) at 10/21/11 0808 Last data filed at 10/20/11 1700  Gross per 24 hour  Intake    480 ml  Output      0 ml  Net    480 ml      ASSESSMENT AND PLAN:  1. Atrial  fibrillation: Patient in sinus; continue coumadin; discontinue ASA; continue coreg.   2. 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 Recheck BMET in AM. Possibly secondary to new onset a fib  3. HTN: Controlled on current therapy.  4. Bladder prolapse:  The patient tells me today that she is not planning surgery at this time.   5. Abdominal pain:  This has been chronic per [facility].  There are no acute findings.  She has a history of diverticulosis, hemorrhoids and chronic constipation.  Follow.  Possible DC in AM if stable.    Olga Millers 10/21/2011 8:08 AM

## 2011-10-21 NOTE — Clinical Documentation Improvement (Signed)
GENERIC DOCUMENTATION CLARIFICATION QUERY  THIS DOCUMENT IS NOT A PERMANENT PART OF THE MEDICAL RECORD   Please update your documentation within the medical record to reflect your response to this query.                                                                                         10/21/11   Dr. Jens Som and/or Associates,  In a better effort to capture your patient's severity of illness, reflect appropriate length of stay and utilization of resources, a review of the patient medical record has revealed the following indicators:  "Acute on Chronic Combined Systolic and Diastolic CHF" [name] 6:15 PM 10/13/2011 H&P  "She is now in atrial fibrillation which is new." [name] 6:15 PM 10/13/2011 H&P  "Cardiac: normal S1, S2; irregularly irregular rhythm; 1-2/6 systolic ejection murmur heard best along the left lower sternal border  Pulse 91"  James Hochrein 6:15 PM 10/13/2011 H&P  "EKG: Atrial fibrillation, heart rate 91, left axis deviation, poor R wave progression"  [name] 6:15 PM 10/13/2011 H&P   Based on your clinical judgment, please document in the progress notes and discharge summary if the patient's Acute on Chronic Systolic and Diastolic Heart Failure was    - Possibly secondary to the new onset of atrial fibrillation    - Probably secondary to the new onset of atrial fibrillation   - Known to be secondary to the new onset of atrial fibrillation   - Other cause   - Unable to Clinically Determine   In responding to this query please exercise your independent judgment.    The fact that a query is asked, does not imply that any particular answer is desired or expected.  Reviewed: additional documentation in the medical record  Thank You,  Jerral Ralph  RN BSN CCDS Clinical Documentation Specialist: Cell   [phone]  [facility]  TO RESPOND TO THE THIS QUERY, FOLLOW THE INSTRUCTIONS BELOW:  1. If  needed, update documentation for the patient's encounter via the notes activity.  2. Access this query again and click edit on the In [employer].  3. After updating, or not, click F2 to complete all highlighted (required) fields concerning your review. Select "additional documentation in the medical record" OR "no additional documentation provided".  4. Click Sign note button.  5. The deficiency will fall out of your In Basket *Please let us know if you are not able to complete this workflow by phone or e-mail (listed below).

## 2011-10-21 NOTE — Progress Notes (Signed)
Physical Therapy Treatment Patient Details Name: [name] MRN: [id number] DOB: [birth year] Today's Date: 10/21/2011 Time: 7829-5621 PT Time Calculation (min): 12 min  PT Assessment / Plan / Recommendation Comments on Treatment Session  Pt requires max encouragement.  Self-limiting.  "I'm only going to go to the chair".      Follow Up Recommendations  Home health PT;Supervision - Intermittent    Barriers to Discharge        Equipment Recommendations  None recommended by PT    Recommendations for Other Services    Frequency Min 3X/week   Plan Discharge plan remains appropriate;Frequency remains appropriate    Precautions / Restrictions Precautions Precautions: Fall Restrictions Weight Bearing Restrictions: No       Mobility  Bed Mobility Bed Mobility: Supine to Sit;Sitting - Scoot to Edge of Bed Supine to Sit: 6: Modified independent (Device/Increase time);HOB elevated;With rails Sitting - Scoot to Edge of Bed: 6: Modified independent (Device/Increase time) Details for Bed Mobility Assistance: No physical assist needed.  Pt kept HOB elevated despite encouragement to flatten bed.  She reports she has a hospital bed at home.   Transfers Transfers: Sit to Stand;Stand to Sit Sit to Stand: 5: Supervision;With upper extremity assist;From bed Stand to Sit: 5: Supervision;With upper extremity assist;With armrests;To chair/3-in-1 Details for Transfer Assistance: reinforcement cues for safe technique but no physical assist needed.   Ambulation/Gait Ambulation/Gait Assistance: 4: Min guard Ambulation Distance (Feet):  (4 steps) Assistive device: Rolling walker Ambulation/Gait Assistance Details: Pt taking ~4 steps from bed>recliner & refusing to ambulate any further despite encouragement.   Stairs: No Wheelchair Mobility Wheelchair Mobility: No      PT Goals Acute Rehab PT Goals Time For Goal Achievement: 10/23/11 Potential to Achieve Goals: Good PT Goal: Supine/Side to  Sit - Progress: Progressing toward goal PT Goal: Sit to Stand - Progress: Progressing toward goal PT Transfer Goal: Bed to Chair/Chair to Bed - Progress: Progressing toward goal  Visit Information  Last PT Received On: 10/21/11 Assistance Needed: +1    Subjective Data  Subjective: "Im only going to the chair" Patient Stated Goal: To go home   Cognition  Overall Cognitive Status: Appears within functional limits for tasks assessed/performed Arousal/Alertness: Awake/alert Orientation Level: Appears intact for tasks assessed Behavior During Session: [name] for tasks performed    Balance     End of Session PT - End of Session Equipment Utilized During Treatment: Gait belt Activity Tolerance: Other (comment) (self-limiting) Patient left: in chair;with call bell/phone within reach (student Nsing present) Nurse Communication: Mobility status    [name], [place] [phone] 10/21/2011

## 2011-10-22 LAB — CBC
MCH: 29 pg (ref 26.0–34.0)
MCV: 90.4 fL (ref 78.0–100.0)
Platelets: 442 10*3/uL — ABNORMAL HIGH (ref 150–400)
RDW: 13.7 % (ref 11.5–15.5)

## 2011-10-22 LAB — BASIC METABOLIC PANEL
BUN: 14 mg/dL (ref 6–23)
GFR calc non Af Amer: 66 mL/min — ABNORMAL LOW (ref >90)
Glucose, Bld: 91 mg/dL (ref 70–99)
Potassium: 4.4 mEq/L (ref 3.5–5.1)

## 2011-10-22 MED ORDER — WARFARIN SODIUM 5 MG PO TABS
5.0000 mg | ORAL_TABLET | Freq: Once | ORAL | Status: AC
  Administered 2011-10-22: 5 mg via ORAL
  Filled 2011-10-22: qty 1

## 2011-10-22 MED ORDER — CARVEDILOL 6.25 MG PO TABS
6.2500 mg | ORAL_TABLET | Freq: Two times a day (BID) | ORAL | Status: DC
  Administered 2011-10-22 – 2011-10-23 (×3): 6.25 mg via ORAL
  Filled 2011-10-22 (×4): qty 1

## 2011-10-22 NOTE — Progress Notes (Signed)
Physical Therapy Treatment Patient Details Name: [name] MRN: [id number] DOB: February 03, 1935 Today's Date: 10/22/2011 Time: 7829-5621 PT Time Calculation (min): 19 min  PT Assessment / Plan / Recommendation Comments on Treatment Session  Patient with improved gait distance today.  Continues to require max encouragement to participate.    Follow Up Recommendations  Home health PT;Supervision - Intermittent    Barriers to Discharge        Equipment Recommendations  None recommended by PT    Recommendations for Other Services    Frequency Min 3X/week   Plan Discharge plan remains appropriate;Frequency remains appropriate    Precautions / Restrictions Precautions Precautions: Fall Restrictions Weight Bearing Restrictions: No   Pertinent Vitals/Pain Pain limiting progress with PT.    Mobility  Bed Mobility Bed Mobility: Not assessed Transfers Transfers: Sit to Stand;Stand to Sit Sit to Stand: 5: Supervision;With upper extremity assist;With armrests;From chair/3-in-1;From toilet Stand to Sit: 5: Supervision;With upper extremity assist;With armrests;To chair/3-in-1;To toilet Details for Transfer Assistance: Cues for safe hand placement and safe technique Ambulation/Gait Ambulation/Gait Assistance: 4: Min guard Ambulation Distance (Feet): 72 Feet Assistive device: Rolling walker Ambulation/Gait Assistance Details: Cues to stand tall during gait.  Cues for safe use of RW in bathroom and at sink.  Gait Pattern: Step-through pattern;Decreased stride length;Trunk flexed;Wide base of support Gait velocity: decreased gait speed     PT Goals Acute Rehab PT Goals PT Goal: Sit to Stand - Progress: Progressing toward goal PT Transfer Goal: Bed to Chair/Chair to Bed - Progress: Progressing toward goal PT Goal: Ambulate - Progress: Progressing toward goal  Visit Information  Last PT Received On: 10/22/11 Assistance Needed: +1    Subjective Data  Subjective: "I hurt all over.   My legs hurt"   Cognition  Overall Cognitive Status: Appears within functional limits for tasks assessed/performed Arousal/Alertness: Awake/alert Orientation Level: Appears intact for tasks assessed Behavior During Session: [facility] for tasks performed    Balance     End of Session PT - End of Session Equipment Utilized During Treatment: Gait belt Activity Tolerance: Patient limited by fatigue;Patient limited by pain Patient left: in chair;with call bell/phone within reach Nurse Communication: Mobility status   GP     Vena Austria 10/22/2011, 1:47 PM Durenda Hurt. Renaldo Fiddler, [facility] Acute Rehab Services Pager [phone]

## 2011-10-22 NOTE — Progress Notes (Signed)
10/22/11 1550 Tera Mater, RN, BSN NCM [phone] Pt. copay for Brilinta 90mg  po BID is $3.50.  Pt. may dc in am if medically stable.

## 2011-10-22 NOTE — Progress Notes (Signed)
ANTICOAGULATION CONSULT NOTE - Follow Up Consult  Pharmacy Consult for Coumadin Indication: atrial fibrillation  Allergies  Allergen Reactions  . Penicillins Other (See Comments)    unknown  . Tetanus-Diphtheria Toxoids Td Other (See Comments)    unknown  . Latex Hives and Rash    Patient Measurements: Height: 5\' 1"  (154.9 cm) Weight: 248 lb 9.6 oz (112.764 kg) (scale C) IBW/kg (Calculated) : 47.8  Heparin Dosing Weight:   Vital Signs: Temp: 98.1 F (36.7 C) (09/13 0616) Temp src: Oral (09/13 0616) BP: 117/77 mmHg (09/13 0757) Pulse Rate: 83  (09/13 0616)  Labs:  Basename 10/22/11 0440 10/21/11 0510 10/20/11 0500  HGB 10.6* 10.6* --  HCT 33.1* 32.6* 31.6*  PLT 442* 425* 413*  APTT -- -- --  LABPROT 28.6* 28.2* 29.4*  INR 2.64* 2.59* 2.73*  HEPARINUNFRC -- -- --  CREATININE 0.84 0.96 0.99  CKTOTAL -- -- --  CKMB -- -- --  TROPONINI -- -- --    Estimated Creatinine Clearance: 67.4 ml/min (by C-G formula based on Cr of 0.84).  Assessment: 77yof continuing on Coumadin for new onset Afib. INR (2.64) remains therapeutic. Will continue with Coumadin 5mg  daily and recommend that as the discharge regimen. - H/H and Plts stable - No bleeding reported  Goal of Therapy:  INR 2-3   Plan:  1. Coumadin 5mg  po x 1 today 2. Follow-up AM INR and discharge plans  Cleon Dew [phone] 10/22/2011,11:36 AM

## 2011-10-22 NOTE — Progress Notes (Signed)
    SUBJECTIVE:  Complains of "not feeling good"; pain in abdomen, feet; dyspnea at baseline.   PHYSICAL EXAM Filed Vitals:   10/21/11 0922 10/21/11 1404 10/21/11 2052 10/22/11 0616  BP: 120/60 99/65 119/65 127/58  Pulse: 77 84 82 83  Temp: 98.9 F (37.2 C) 98.5 F (36.9 C) 98.2 F (36.8 C) 98.1 F (36.7 C)  TempSrc: Oral Oral Oral Oral  Resp: 20 20 22 22   Height:      Weight:    248 lb 9.6 oz (112.764 kg)  SpO2: 95% 99% 97% 94%   General:  No acute distress Neck: supple Lungs:  Clear Heart:  RRR Abdomen:  Mild tenderness to palpation Extremities: No edema  LABS:  Results for orders placed during the hospital encounter of 10/13/11 (from the past 24 hour(s))  CBC     Status: Abnormal   Collection Time   10/22/11  4:40 AM      Component Value Range   WBC 5.1  4.0 - 10.5 K/uL   RBC 3.66 (*) 3.87 - 5.11 MIL/uL   Hemoglobin 10.6 (*) 12.0 - 15.0 g/dL   HCT 19.1 (*) 47.8 - 29.5 %   MCV 90.4  78.0 - 100.0 fL   MCH 29.0  26.0 - 34.0 pg   MCHC 32.0  30.0 - 36.0 g/dL   RDW 62.1  30.8 - 65.7 %   Platelets 442 (*) 150 - 400 K/uL  PROTIME-INR     Status: Abnormal   Collection Time   10/22/11  4:40 AM      Component Value Range   Prothrombin Time 28.6 (*) 11.6 - 15.2 seconds   INR 2.64 (*) 0.00 - 1.49  BASIC METABOLIC PANEL     Status: Abnormal   Collection Time   10/22/11  4:40 AM      Component Value Range   Sodium 137  135 - 145 mEq/L   Potassium 4.4  3.5 - 5.1 mEq/L   Chloride 101  96 - 112 mEq/L   CO2 25  19 - 32 mEq/L   Glucose, Bld 91  70 - 99 mg/dL   BUN 14  6 - 23 mg/dL   Creatinine, Ser 8.46  0.50 - 1.10 mg/dL   Calcium 9.2  8.4 - 96.2 mg/dL   GFR calc non Af Amer 66 (*) >90 mL/min   GFR calc Af Amer 77 (*) >90 mL/min    Intake/Output Summary (Last 24 hours) at 10/22/11 0730 Last data filed at 10/22/11 0617  Gross per 24 hour  Intake   1180 ml  Output   1225 ml  Net    -45 ml      ASSESSMENT AND PLAN:  1. Atrial fibrillation: Patient in sinus;  continue coumadin; discontinue ASA; continue coreg. Telemetry with occasional pacs and brief runs of PAT; increase coreg to 6.25 BID   2. 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 Possibly secondary to new onset a fib; remains in sinus  3. HTN: Controlled on current therapy.  4. Bladder prolapse:  The patient tells me today that she is not planning surgery at this time.   5. Abdominal pain:  This has been chronic per [facility].  There are no acute findings.  She has a history of diverticulosis, hemorrhoids and chronic constipation.  Follow.  Patient "weak"; ambulate today; Possible DC in AM if stable.    [name] 10/22/2011 7:30 AM

## 2011-10-23 ENCOUNTER — Encounter (HOSPITAL_COMMUNITY): Payer: Self-pay | Admitting: Cardiology

## 2011-10-23 LAB — CBC
Hemoglobin: 11.1 g/dL — ABNORMAL LOW (ref 12.0–15.0)
MCH: 28.8 pg (ref 26.0–34.0)
MCHC: 32.2 g/dL (ref 30.0–36.0)

## 2011-10-23 LAB — PROTIME-INR: Prothrombin Time: 34.5 seconds — ABNORMAL HIGH (ref 11.6–15.2)

## 2011-10-23 MED ORDER — LISINOPRIL 5 MG PO TABS: 5.0000 mg | ORAL_TABLET | Freq: Every day | ORAL | Status: DC

## 2011-10-23 MED ORDER — WARFARIN SODIUM 2.5 MG PO TABS: ORAL_TABLET | ORAL | Status: DC

## 2011-10-23 MED ORDER — FUROSEMIDE 40 MG PO TABS: 60.0000 mg | ORAL_TABLET | Freq: Two times a day (BID) | ORAL | Status: DC

## 2011-10-23 MED ORDER — POTASSIUM CHLORIDE CRYS ER 20 MEQ PO TBCR: 20.0000 meq | EXTENDED_RELEASE_TABLET | Freq: Three times a day (TID) | ORAL | Status: DC

## 2011-10-23 MED ORDER — CARVEDILOL 6.25 MG PO TABS: 6.2500 mg | ORAL_TABLET | Freq: Two times a day (BID) | ORAL | Status: DC

## 2011-10-23 NOTE — Consult Note (Signed)
ANTICOAGULATION CONSULT NOTE - Follow Up Consult  Pharmacy Consult for Coumadin Indication: atrial fibrillation  Allergies Allergies  Allergen Reactions  . Penicillins Other (See Comments)    unknown  . Tetanus-Diphtheria Toxoids Td Other (See Comments)    unknown  . Latex Hives and Rash    Patient Measurements: Height: 5\' 1"  (154.9 cm) Weight: 250 lb 14.1 oz (113.8 kg) IBW/kg (Calculated) : 47.8   Vital Signs: BP 138/83  Pulse 73  Temp 99.2 F (37.3 C) (Oral)  Resp 18  Ht 5\' 1"  (1.549 m)  Wt 250 lb 14.1 oz (113.8 kg)  BMI 47.40 kg/m2  SpO2 99%  Labs:  Basename 10/23/11 0515 10/22/11 0440 10/21/11 0510  HGB 11.1* 10.6* --  HCT 34.5* 33.1* 32.6*  PLT 493* 442* 425*  LABPROT 34.5* 28.6* 28.2*  INR 3.36* 2.64* 2.59*  CREATININE -- 0.84 0.96   Lab Results  Component Value Date   INR 3.36* 10/23/2011   INR 2.64* 10/22/2011   INR 2.59* 10/21/2011    Estimated Creatinine Clearance: 67.8 ml/min (by C-G formula based on Cr of 0.84).  Medications:  Scheduled:    . amLODipine  10 mg Oral Daily  . bisacodyl  5 mg Oral Daily  . carvedilol  6.25 mg Oral BID WC  . ferrous fumarate  1 tablet Oral BID  . furosemide  60 mg Oral BID  . lisinopril  5 mg Oral Daily  . potassium chloride  20 mEq Oral TID  . senna  1 tablet Oral QHS  . sodium chloride  3 mL Intravenous Q12H  . warfarin  5 mg Oral ONCE-1800  . Warfarin - Pharmacist Dosing Inpatient   Does not apply q1800    Assessment:  77 yo female on Coumadin for new onset Afib.  INR with significant jump overnight.  INR 3.36 which is above therapeutic target.   H/H and Plts stable. No bleeding reported  Goal of Therapy:  Target INR 2-3    Plan:  No Coumadin today.     Recommend follow up INR early in the week.    Patient has been informed NOT to take any Coumadin dose today, but start Coumadin doses as directed on discharge instructions.  Humphrey Guerreiro, Deetta Perla.D 10/23/2011, 2:10 PM

## 2011-10-23 NOTE — Discharge Summary (Signed)
Discharge Summary   Patient ID: [name] MRN: [id number], DOB/AGE: Dec 13, 1934 77 y.o.  Primary MD: Astrid Divine, MD Primary Cardiologist: Dr. Rollene Rotunda  Admit date: 10/13/2011 D/C date:     10/23/2011      Primary Discharge Diagnoses:  1. Acute on Chronic combined systolic and diastolic CHF, EF 40-98%  - In the setting of new onset A.Fib  - Improved with diuresis  - Discharge weight 250lbs (down from 258lbs)  - Initiated on ACEI, f/u BMET  2. Atrial Fibrillation with controlled ventricular rate  - New this admission  - CHADS2 score 3, initiated on Coumadin  - INR 3.36 at discharge, coumadin dosing as below  - F/u coumadin clinic  - Consider switching to newer agent   3. Hypokalemia 4. Hypertension 5. Chronic Abdominal Pain 6. Chronic Iron deficiency anemia 7. Bladder prolapse, pending surgery   Secondary Discharge Diagnoses:  . HTN (hypertension)   . Diverticulosis   . Hemorrhoid   . Osteoarthritis   . Constipation, chronic   . UTI (lower urinary tract infection)   . H/O: hysterectomy   . Systolic and diastolic CHF, chronic     a. 07/2011 Echo:  EF ~40%, mild MR, mild LAE, ?RV dysfunction  . Female bladder prolapse     with prolapse uterus, cystocele, s/p TVH/BSO 12/2006  . Obesity (BMI 30-39.9)     wt. 94.1 kg 12/2010  . Moderate mitral regurgitation     a. Mod to Sev by echo 01/2009;  b. mild MR by echo 2013  . Dysrhythmia 10/13/2011    atrial fibrilation, on coumadin  . Iron deficiency anemia     Allergies Allergies  Allergen Reactions  . Penicillins Other (See Comments)    unknown  . Tetanus-Diphtheria Toxoids Td Other (See Comments)    unknown  . Latex Hives and Rash    Diagnostic Studies/Procedures:  None  History of Present Illness: 77 y.o. female w/ the above medical problems who was admitted to [facility] on 10/13/11 for progressive dyspnea and weight gain. She was seen in clinic by Dr. Antoine Poche on 10/13/11 for surgical  clearance of repair of bladder prolapse and was noted to be short of breath, volume overloaded, and in atrial fibrillation (new onset) for which she was admitted to the hospital for further evaluation and management.   Hospital Course: EKG showed Atrial fibrillation 91bpm, left axis deviation, poor R wave progression. CXR showed cardiomegaly with development of mild pulmonary venous congestion. Labs were significant for normal troponin, BNP 1750.  She was diuresed with IV lasix with improvement in symptoms and volume status. She diuresed a total of 3.5L and weight decreased 8lbs to 250lbs. Lisinopril was initiated. Renal function was monitored and electrolytes supplemented as needed. Lasix was transitioned to oral dosing (60mg  BID). Cardiac enzymes were cycled and remained negative. She has chronic iron def anemia with stable H&H and no active signs of bleeding. She was initiated on Coumadin for primary stroke prevention given her new onset of atrial fibrillation. She converted to NSR on 10/21/11. INR was supratherapeutic at dc. Per pharmacy recommendations her coumadin was held on day of discharge and will be resumed tomorrow with INR early next week. Coreg was increased to 6.25mg  BID for occasional PACs and runs of PAT on telemetry. She was able to ambulate with Physical therapy. She will need home health RN for heart failure and medication management and home health physical therapy which has been arranged by case management.  She was seen and evaluated by Dr. Daleen Squibb who felt she was stable for discharge home with plans for follow up as scheduled below.  Discharge Vitals: Blood pressure 101/69, pulse 88, temperature 98.3 F (36.8 C), temperature source Oral, resp. rate 19, height 5\' 1"  (1.549 m), weight 250 lb 14.1 oz (113.8 kg), SpO2 96.00%.  Labs: Component Value Date   WBC 5.5 10/23/2011   HGB 11.1* 10/23/2011   HCT 34.5* 10/23/2011   MCV 89.6 10/23/2011   PLT 493* 10/23/2011     10/23/2011 05:15    Prothrombin Time 34.5 (H)  INR 3.36 (H)   Lab 10/22/11 0440  NA 137  K 4.4  CL 101  CO2 25  BUN 14  CREATININE 0.84  CALCIUM 9.2  PROT --  GLUCOSE 91     10/13/2011 14:26 10/13/2011 17:59 10/14/2011 01:05  Troponin I <0.30 <0.30 <0.30    10/13/2011 14:26 10/16/2011 09:29  Pro B Natriuretic peptide (BNP) 1750.0 (H) 1078.0 (H)     10/13/2011 14:26  TSH 1.782     Discharge Medications     Medication List     As of 10/23/2011  4:53 PM    STOP taking these medications         aspirin 81 MG tablet      TAKE these medications         amLODipine 10 MG tablet   Commonly known as: NORVASC   Take 1 tablet (10 mg total) by mouth daily.      bisacodyl 5 MG EC tablet   Commonly known as: DULCOLAX   Take 5 mg by mouth daily. For      carvedilol 6.25 MG tablet   Commonly known as: COREG   Take 1 tablet (6.25 mg total) by mouth 2 (two) times daily with a meal.      FERRETTS 325 (106 FE) MG Tabs   Generic drug: ferrous fumarate   Take 1 tablet by mouth 2 (two) times daily.      furosemide 40 MG tablet   Commonly known as: LASIX   Take 1.5 tablets (60 mg total) by mouth 2 (two) times daily.      HYDROcodone-acetaminophen 5-500 MG per tablet   Commonly known as: VICODIN   Take 1 tablet by mouth every 8 (eight) hours as needed.      lisinopril 5 MG tablet   Commonly known as: PRINIVIL,ZESTRIL   Take 1 tablet (5 mg total) by mouth daily.      potassium chloride SA 20 MEQ tablet   Commonly known as: K-DUR,KLOR-CON   Take 1 tablet (20 mEq total) by mouth 3 (three) times daily.      traMADol 50 MG tablet   Commonly known as: ULTRAM   Take 50 mg by mouth every 8 (eight) hours as needed. For pain      warfarin 2.5 MG tablet   Commonly known as: COUMADIN   Please do not take your coumadin today (10/23/11). Tomorrow (10/24/11) please take 2.5mg . Monday (10/25/11) take 5mg . Come to the office to have your blood checked on Tuesday 9/17 for further instructions on dosing.           Disposition   Discharge Orders    Future Appointments: Provider: Department: Dept Phone: Center:   10/27/2011 9:50 AM Beatrice Lecher, PA [facility] [phone] [facility]   11/22/2011 8:50 AM Beatrice Lecher, PA [facility] [phone] [facility]     Future Orders Please Complete By Expires  Diet - low sodium heart healthy      Increase activity slowly      Discharge instructions      Comments:   * You were started on a blood thinner (Coumadin) for your atrial fibrillation to help prevent a stroke. It is very important you take it as prescribed. Please do not take your coumadin today (10/23/11). Tomorrow (10/24/11) please take 2.5mg . Monday (10/25/11) take 5mg . Come to the office to have your blood checked on Tuesday 9/17 for further instructions on dosing.  * You will also need to have blood work [employer]) to check your kidney function on Tuesday 9/17.  1. Follow a low-salt diet and watch your fluid intake. In general, you should not be taking in more than 2 liters of fluid per day. Some patients are restricted to less than 1.5 liters. This includes sources of water in foods like soup. 2. Weigh yourself on the same scale at same time of day and keep a log. 3. Call your doctor: (Anytime you feel any of the following symptoms)  - 3-4 pound weight gain in 1-2 days or 2 pounds overnight  - Shortness of breath, with or without a dry hacking cough  - Swelling in the hands, feet or stomach  - If you have to sleep on extra pillows at night in order to breathe     Follow-up Information    Follow up with [facility]. On 10/26/2011. (INR and BMET. Our office will call you with apponitment time.)    Contact information:   [facility] [address] [place] [address] [phone]      Follow up with Tereso Newcomer, PA. On 10/27/2011. (9:50)    Contact information:   [facility] [address] [place] [address] [phone]          Outstanding Labs/Studies:  1. BMET and INR  Duration of Discharge Encounter: Greater than 30 minutes including physician and PA time.  Signed, HOPE, JESSICA PA-C 10/23/2011, 4:53 PM  Jesse Sans. Daleen Squibb, MD, [facility] Pager:  [phone]

## 2011-10-23 NOTE — Progress Notes (Signed)
IV d/c'd.  Tele d/c'd.  Pt d/c'd to home.  Home meds and d/c instructions have been discussed with pt.  Pt denies any questions or concerns at this time.  Pt leaving unit via wheelchair and appears in no acute distress. Neftali Abair RN 

## 2011-10-23 NOTE — Progress Notes (Signed)
Patient ID: [name], female   DOB: [birth year], 77 y.o.   MRN: [id number]   Patient Name: [name] Date of Encounter: 10/23/2011    SUBJECTIVE  Patient has multiple complaints including atypical chest pain and thinks all over. She is stable hemodynamically and remains in sinus rhythm. INR is supratherapeutic.  CURRENT MEDS    . amLODipine  10 mg Oral Daily  . bisacodyl  5 mg Oral Daily  . carvedilol  6.25 mg Oral BID WC  . ferrous fumarate  1 tablet Oral BID  . furosemide  60 mg Oral BID  . lisinopril  5 mg Oral Daily  . potassium chloride  20 mEq Oral TID  . senna  1 tablet Oral QHS  . sodium chloride  3 mL Intravenous Q12H  . warfarin  5 mg Oral ONCE-1800  . Warfarin - Pharmacist Dosing Inpatient   Does not apply q1800    OBJECTIVE  Filed Vitals:   10/22/11 1700 10/22/11 2136 10/23/11 0557 10/23/11 0915  BP: 122/81 111/72 118/59 138/83  Pulse: 84 77 81 73  Temp:  97.6 F (36.4 C) 99.2 F (37.3 C)   TempSrc:  Oral Oral   Resp:  18 18   Height:      Weight:   250 lb 14.1 oz (113.8 kg)   SpO2:  95% 99%     Intake/Output Summary (Last 24 hours) at 10/23/11 1221 Last data filed at 10/23/11 1202  Gross per 24 hour  Intake    960 ml  Output   1300 ml  Net   -340 ml   Filed Weights   10/21/11 0504 10/22/11 0616 10/23/11 0557  Weight: 256 lb 6.3 oz (116.3 kg) 248 lb 9.6 oz (112.764 kg) 250 lb 14.1 oz (113.8 kg)    PHYSICAL EXAM  General: Pleasant, NAD. Neuro: Alert and oriented X 3. Moves all extremities spontaneously. Psych: Normal affect. HEENT:  Normal  Neck: Supple without bruits or JVD. Lungs:  Resp regular and unlabored, CTA. Heart: RRR no s3, s4, or murmurs. Abdomen: Soft, non-tender, non-distended, BS + x 4.  Extremities: No clubbing, cyanosis or edema. DP/PT/Radials 2+ and equal bilaterally.  Accessory Clinical Findings  CBC  Basename 10/23/11 0515 10/22/11 0440  WBC 5.5 5.1  NEUTROABS -- --  HGB 11.1* 10.6*  HCT 34.5* 33.1*  MCV  89.6 90.4  PLT 493* 442*   Basic Metabolic Panel  Basename 10/22/11 0440 10/21/11 0510  NA 137 134*  K 4.4 4.8  CL 101 98  CO2 25 27  GLUCOSE 91 96  BUN 14 18  CREATININE 0.84 0.96  CALCIUM 9.2 9.5  MG -- --  PHOS -- --   Liver Function Tests No results found for this basename: AST:2,ALT:2,ALKPHOS:2,BILITOT:2,PROT:2,ALBUMIN:2 in the last 72 hours No results found for this basename: LIPASE:2,AMYLASE:2 in the last 72 hours Cardiac Enzymes No results found for this basename: CKTOTAL:3,CKMB:3,CKMBINDEX:3,TROPONINI:3 in the last 72 hours BNP No components found with this basename: POCBNP:3 D-Dimer No results found for this basename: DDIMER:2 in the last 72 hours Hemoglobin A1C No results found for this basename: HGBA1C in the last 72 hours Fasting Lipid Panel No results found for this basename: CHOL,HDL,LDLCALC,TRIG,CHOLHDL,LDLDIRECT in the last 72 hours Thyroid Function Tests No results found for this basename: TSH,T4TOTAL,FREET3,T3FREE,THYROIDAB in the last 72 hours  TELE  Normal sinus rhythm  ECG   Radiology/Studies  Dg Chest 2 View  10/14/2011  *RADIOLOGY REPORT*  Clinical Data: Dyspnea.  CHF.  Shortness of breath.  CHEST - 2 VIEW  Comparison: 07/19/2011  Findings: Lateral view degraded by patient arm position.  Accentuation of expected thoracic kyphosis. Midline trachea.  Mild cardiomegaly. No pleural effusion or pneumothorax.  Development of mild pulmonary venous congestion. No lobar consolidation.  May be mild left base subsegmental atelectasis.  IMPRESSION: Cardiomegaly with development of mild pulmonary venous congestion.   Original Report Authenticated By: Consuello Bossier, M.D.     ASSESSMENT AND PLAN  Principal Problem:  *Acute on chronic combined systolic and diastolic congestive heart failure Active Problems:  HYPERTENSION  Hypokalemia  Abdominal pain  Iron deficiency anemia  Bladder prolapse, female, acquired  Atrial fibrillation   Ready for discharge.  Followup in the office within the next 2 weeks.   Signed, Valera Castle MD

## 2011-10-23 NOTE — Progress Notes (Signed)
Cm contacted b. Per  MD concerning dc planning. Per previous CM note pt active with [facility]. Cm faxed Md orders, dc summary to [facility] at [phone].Marland Kitchen Confirmation received No other needs specified.   Erian Lariviere,Rn,BSN

## 2011-10-26 ENCOUNTER — Other Ambulatory Visit: Payer: Self-pay | Admitting: Cardiology

## 2011-10-26 ENCOUNTER — Ambulatory Visit: Payer: Self-pay | Admitting: Cardiovascular Disease

## 2011-10-26 ENCOUNTER — Other Ambulatory Visit (INDEPENDENT_AMBULATORY_CARE_PROVIDER_SITE_OTHER): Payer: Medicare Other

## 2011-10-26 DIAGNOSIS — Z7901 Long term (current) use of anticoagulants: Secondary | ICD-10-CM | POA: Insufficient documentation

## 2011-10-26 DIAGNOSIS — I4891 Unspecified atrial fibrillation: Secondary | ICD-10-CM

## 2011-10-26 LAB — BASIC METABOLIC PANEL
Calcium: 8.6 mg/dL (ref 8.4–10.5)
Creatinine, Ser: 1.2 mg/dL (ref 0.4–1.2)
GFR: 54.71 mL/min — ABNORMAL LOW (ref >60.00)

## 2011-10-26 LAB — PROTIME-INR
INR: 7.6 ratio (ref 0.8–1.0)
Prothrombin Time: 85.1 s (ref 10.2–12.4)

## 2011-10-26 NOTE — Patient Instructions (Signed)
Instructed daughter to hold mothers coumadin due to elevated INR.

## 2011-10-27 ENCOUNTER — Ambulatory Visit (INDEPENDENT_AMBULATORY_CARE_PROVIDER_SITE_OTHER): Payer: Medicare Other | Admitting: Pharmacist

## 2011-10-27 ENCOUNTER — Ambulatory Visit (INDEPENDENT_AMBULATORY_CARE_PROVIDER_SITE_OTHER): Payer: Medicare Other | Admitting: Physician Assistant

## 2011-10-27 ENCOUNTER — Encounter: Payer: Self-pay | Admitting: Physician Assistant

## 2011-10-27 VITALS — BP 108/65 | HR 84 | Ht 60.0 in | Wt 252.0 lb

## 2011-10-27 DIAGNOSIS — I4891 Unspecified atrial fibrillation: Secondary | ICD-10-CM

## 2011-10-27 DIAGNOSIS — I5042 Chronic combined systolic (congestive) and diastolic (congestive) heart failure: Secondary | ICD-10-CM

## 2011-10-27 DIAGNOSIS — Z7901 Long term (current) use of anticoagulants: Secondary | ICD-10-CM

## 2011-10-27 DIAGNOSIS — I10 Essential (primary) hypertension: Secondary | ICD-10-CM

## 2011-10-27 NOTE — Patient Instructions (Addendum)
Your physician recommends that you continue on your current medications as directed. Please refer to the Current Medication list given to you today.   Your physician recommends that you KEEP appointment ON 10/14 WITH [address] PA-C

## 2011-10-27 NOTE — Progress Notes (Addendum)
[address] [place], [place]  [address] Phone: [phone] Fax:  [phone]  Date:  10/27/2011   Name:  [name]   DOB:  03-31-1934   MRN:  [id number]  PCP:  Astrid Divine, MD  Primary Cardiologist:  Dr. Rollene Rotunda  Primary Electrophysiologist:  None    History of Present Illness: [name] is a 77 y.o. female who returns for post hospital follow up.  She has a history of combined systolic and diastolic CHF, cardiomyopathy with an EF of 40%, HTN, chronic abdominal pain. Echo 07/2011: Technically limited; inferior HK, inferolateral and possibly lateral wall HK, EF 40%, moderate LVE, aortic sclerosis without stenosis, mild MR, mild LAE, question RV dysfunction. She was last admitted in 07/2011 with acute on chronic CHF. She had chest pain and cardiac markers were negative. No further cardiac workup was pursued. Last seen by Dr. Antoine Poche 08/05/11.  I saw her 10/13/11 for surgical clearance. She needs surgical correction of bladder prolapse.  However, she presented in new onset atrial fibrillation with controlled rate and a/c combined systolic and diastolic heart failure. We admitted her to the hospital for diuresis and anticoagulation. She remained there for 10 days. She diuresed 3.5 L and was down 8 pounds at discharge. Myocardial infarction was ruled out. She was initiated on Coumadin. She spontaneously converted to sinus rhythm 9/12.  Today she notes that she is doing well. He denies significant changes in her dyspnea. She sleeps on 2 pillows without significant change. She denies PND. Her LE edema is improved. She had some chest pain from time to time. This is chronic without change. She denies syncope. She has had trouble with getting her INR in range. It was 7.6 yesterday. Home health will be drawing her INR early next week. She's currently on hold. She denies any bleeding problems.  Wt Readings from Last 3 Encounters:  10/27/11 252 lb (114.306  kg)  10/23/11 250 lb 14.1 oz (113.8 kg)  10/13/11 257 lb 6.4 oz (116.756 kg)     Past Medical History  Diagnosis Date  . HTN (hypertension)   . Diverticulosis   . Hemorrhoid   . Osteoarthritis   . Constipation, chronic   . UTI (lower urinary tract infection)   . H/O: hysterectomy   . Systolic and diastolic CHF, chronic     a. 07/2011 Echo:  EF ~40%, mild MR, mild LAE, ?RV dysfunction  . Female bladder prolapse     with prolapse uterus, cystocele, s/p TVH/BSO 12/2006  . Obesity (BMI 30-39.9)     wt. 94.1 kg 12/2010  . Moderate mitral regurgitation     a. Mod to Sev by echo 01/2009;  b. mild MR by echo 2013  . Dysrhythmia 10/13/2011    atrial fibrilation, on coumadin  . Iron deficiency anemia     Current Outpatient Prescriptions  Medication Sig Dispense Refill  . amLODipine (NORVASC) 10 MG tablet Take 1 tablet (10 mg total) by mouth daily.  30 tablet  0  . bisacodyl (DULCOLAX) 5 MG EC tablet Take 5 mg by mouth daily. For      . carvedilol (COREG) 6.25 MG tablet Take 1 tablet (6.25 mg total) by mouth 2 (two) times daily with a meal.  60 tablet  6  . ferrous fumarate (FERRETTS) 325 (106 FE) MG TABS Take 1 tablet by mouth 2 (two) times daily.       . furosemide (LASIX) 40 MG tablet Take 1.5 tablets (60 mg  total) by mouth 2 (two) times daily.  90 tablet  6  . HYDROcodone-acetaminophen (VICODIN) 5-500 MG per tablet Take 1 tablet by mouth every 8 (eight) hours as needed.       Marland Kitchen lisinopril (PRINIVIL,ZESTRIL) 5 MG tablet Take 1 tablet (5 mg total) by mouth daily.  30 tablet  6  . potassium chloride SA (K-DUR,KLOR-CON) 20 MEQ tablet Take 1 tablet (20 mEq total) by mouth 3 (three) times daily.  90 tablet  6  . traMADol (ULTRAM) 50 MG tablet Take 50 mg by mouth every 8 (eight) hours as needed. For pain      . warfarin (COUMADIN) 2.5 MG tablet Please do not take your coumadin today (10/23/11). Tomorrow (10/24/11) please take 2.5mg . Monday (10/25/11) take 5mg . Come to the office to have your blood  checked on Tuesday 9/17 for further instructions on dosing.  60 tablet  3    Allergies: Allergies  Allergen Reactions  . Penicillins Other (See Comments)    unknown  . Tetanus-Diphtheria Toxoids Td Other (See Comments)    unknown  . Latex Hives and Rash    History  Substance Use Topics  . Smoking status: Never Smoker   . Smokeless tobacco: Current User    Types: Snuff   Comment: Uses Snuff regularly.  Dips several x/day.  A "large can" can last her up to 3 mos.  . Alcohol Use: No     ROS:  Please see the history of present illness.     All other systems reviewed and negative.   PHYSICAL EXAM: VS:  BP 108/65  Pulse 84  Ht 5' (1.524 m)  Wt 252 lb (114.306 kg)  BMI 49.22 kg/m2 Well nourished, well developed, in no acute distress HEENT: normal Neck: No JVD at 90 Cardiac:  normal S1, S2; regular rate and rhythm; 1-2/6 systolic ejection murmur heard best along the left lower sternal border Lungs:  Clear to auscultation bilaterally, no wheezing, rhonchi or rales  Abd: soft, nontender Ext: No LE edema Skin: warm and dry Neuro:  CNs 2-12 intact, no focal abnormalities noted  EKG:  Sinus rhythm, heart rate 80, left axis deviation, and nonspecific ST-T wave changes    ASSESSMENT AND PLAN:  1. Chronic Combined Systolic and Diastolic CHF:  Volume stable. Continue current therapy. Recent creatinine stable. Keep followup with me in one month.  2. Atrial Fibrillation: CHADS2 score is 3. She needs long-term anticoagulation to prevent stroke. She is followed by the Coumadin clinic. Her recent INR has been supratherapeutic. This is being followed closely by the Coumadin clinic. Of note, she is still in sinus rhythm today.  3. Prolapsed Bladder: Surgery is pending. Patient tells me she plans to hold off on surgery for now. I reviewed her case with Dr. Rollene Rotunda.  If she does decide to proceed, we will need to have her undergo a Myoview for risk stratification.  4. Hypertension:  Controlled.  Signed, Tereso Newcomer, PA-C  10:15 AM 10/27/2011

## 2011-11-01 ENCOUNTER — Telehealth: Payer: Self-pay | Admitting: Physician Assistant

## 2011-11-01 ENCOUNTER — Ambulatory Visit: Payer: Self-pay | Admitting: Cardiology

## 2011-11-01 DIAGNOSIS — I4891 Unspecified atrial fibrillation: Secondary | ICD-10-CM

## 2011-11-01 DIAGNOSIS — Z7901 Long term (current) use of anticoagulants: Secondary | ICD-10-CM

## 2011-11-01 LAB — POCT INR: INR: 1.9

## 2011-11-01 NOTE — Telephone Encounter (Signed)
New Problem:    Called to give INR: 1.9, PT:19.1

## 2011-11-01 NOTE — Telephone Encounter (Signed)
I will show INR to [employer]. PAC

## 2011-11-02 NOTE — Telephone Encounter (Signed)
s/w pt's daughter [name] today and told her that it was the [facility] clinic trying to reach pt to give coumading instructions. I then s/w  Tiffany in [facility] today and she said she will call pt's daughter today 

## 2011-11-02 NOTE — Telephone Encounter (Signed)
s/w pt's daughter [name] today and told her that it was the [facility] clinic trying to reach pt to give coumading instructions. I then s/w  Tiffany in [facility] today and she said she will call pt's daughter today

## 2011-11-02 NOTE — Telephone Encounter (Signed)
Pt's dtr rtn call, pls call back at [phone] cheryl Head

## 2011-11-05 ENCOUNTER — Ambulatory Visit: Payer: Self-pay | Admitting: Cardiology

## 2011-11-05 DIAGNOSIS — I4891 Unspecified atrial fibrillation: Secondary | ICD-10-CM

## 2011-11-05 DIAGNOSIS — Z7901 Long term (current) use of anticoagulants: Secondary | ICD-10-CM

## 2011-11-05 LAB — POCT INR: INR: 1.5

## 2011-11-15 ENCOUNTER — Ambulatory Visit: Payer: Self-pay | Admitting: Cardiology

## 2011-11-15 DIAGNOSIS — I4891 Unspecified atrial fibrillation: Secondary | ICD-10-CM

## 2011-11-15 DIAGNOSIS — Z7901 Long term (current) use of anticoagulants: Secondary | ICD-10-CM

## 2011-11-16 ENCOUNTER — Ambulatory Visit (INDEPENDENT_AMBULATORY_CARE_PROVIDER_SITE_OTHER): Payer: Medicare Other | Admitting: Physician Assistant

## 2011-11-16 ENCOUNTER — Encounter: Payer: Self-pay | Admitting: Physician Assistant

## 2011-11-16 ENCOUNTER — Inpatient Hospital Stay (HOSPITAL_COMMUNITY): Admission: AD | Admit: 2011-11-16 | Payer: Medicare Other | Source: Ambulatory Visit | Admitting: Internal Medicine

## 2011-11-16 ENCOUNTER — Telehealth: Payer: Self-pay | Admitting: Cardiology

## 2011-11-16 VITALS — BP 118/66 | HR 81 | Resp 21 | Ht 62.0 in | Wt 262.8 lb

## 2011-11-16 DIAGNOSIS — I4891 Unspecified atrial fibrillation: Secondary | ICD-10-CM

## 2011-11-16 DIAGNOSIS — R0602 Shortness of breath: Secondary | ICD-10-CM

## 2011-11-16 DIAGNOSIS — I5043 Acute on chronic combined systolic (congestive) and diastolic (congestive) heart failure: Secondary | ICD-10-CM

## 2011-11-16 DIAGNOSIS — I10 Essential (primary) hypertension: Secondary | ICD-10-CM

## 2011-11-16 LAB — BASIC METABOLIC PANEL
BUN: 7 mg/dL (ref 6–23)
Creatinine, Ser: 1 mg/dL (ref 0.4–1.2)
GFR: 72.82 mL/min (ref >60.00)

## 2011-11-16 LAB — BRAIN NATRIURETIC PEPTIDE: Pro B Natriuretic peptide (BNP): 297 pg/mL — ABNORMAL HIGH (ref 0.0–100.0)

## 2011-11-16 MED ORDER — METOLAZONE 5 MG PO TABS: ORAL_TABLET | ORAL | Status: DC

## 2011-11-16 NOTE — Telephone Encounter (Signed)
lmom for Sarah w/[employer] tcb 

## 2011-11-16 NOTE — Progress Notes (Signed)
[address] [place], [place]  [address] Phone: [phone] Fax:  [phone]  Date:  11/16/2011   Name:  [name]   DOB:  [birth year]   MRN:  [id number]  PCP:  Astrid Divine, MD  Primary Cardiologist:  Dr. Rollene Rotunda  Primary Electrophysiologist:  None    History of Present Illness: [name] is a 77 y.o. female who returns for evaluation of shortness of breath and edema.  She has a hx of combined systolic and diastolic CHF, DCM with an EF of 40%, HTN. Echo 07/2011: Technically limited; inferior HK, inferolateral and possibly lateral wall HK, EF 40%, moderate LVE, aortic sclerosis without stenosis, mild MR, mild LAE, question RV dysfunction. Admitted in 07/2011 with a/c CHF.  Admitted again from the office 10/13/11 in new onset atrial fibrillation with controlled rate and a/c combined systolic and diastolic CHF.  She was initiated on Coumadin. She spontaneously converted to sinus rhythm.  I saw her last on 10/27/11. Volume was stable at that time. She remained in sinus rhythm. Plan was for one month followup.  Over the last 1-2 weeks, she has developed worsening shortness of breath with exertion. She describes class III symptoms. She sleeps on one pillow. She denies PND. LE edema is much worse and painful. She denies syncope. She has occasional chest pains. These are atypical. She has had these in the past without significant change.  Labs (9/13):  K 4.4, creatinine 1.2, hemoglobin 11.1, TSH 1.782  Wt Readings from Last 3 Encounters:  11/16/11 262 lb 12.8 oz (119.205 kg)  10/27/11 252 lb (114.306 kg)  10/23/11 250 lb 14.1 oz (113.8 kg)     Past Medical History  Diagnosis Date  . HTN (hypertension)   . Diverticulosis   . Hemorrhoid   . Osteoarthritis   . Constipation, chronic   . UTI (lower urinary tract infection)   . H/O: hysterectomy   . Systolic and diastolic CHF, chronic     a. 07/2011 Echo:  EF ~40%, mild MR, mild LAE, ?RV  dysfunction  . Female bladder prolapse     with prolapse uterus, cystocele, s/p TVH/BSO 12/2006  . Obesity (BMI 30-39.9)     wt. 94.1 kg 12/2010  . Moderate mitral regurgitation     a. Mod to Sev by echo 01/2009;  b. mild MR by echo 2013  . Dysrhythmia 10/13/2011    atrial fibrilation, on coumadin  . Iron deficiency anemia     Current Outpatient Prescriptions  Medication Sig Dispense Refill  . amLODipine (NORVASC) 10 MG tablet Take 1 tablet (10 mg total) by mouth daily.  30 tablet  0  . bisacodyl (DULCOLAX) 5 MG EC tablet Take 5 mg by mouth daily. For      . carvedilol (COREG) 6.25 MG tablet Take 1 tablet (6.25 mg total) by mouth 2 (two) times daily with a meal.  60 tablet  6  . ferrous fumarate (FERRETTS) 325 (106 FE) MG TABS Take 1 tablet by mouth 2 (two) times daily.       . furosemide (LASIX) 40 MG tablet Take 1.5 tablets (60 mg total) by mouth 2 (two) times daily.  90 tablet  6  . HYDROcodone-acetaminophen (VICODIN) 5-500 MG per tablet Take 1 tablet by mouth every 8 (eight) hours as needed.       Marland Kitchen lisinopril (PRINIVIL,ZESTRIL) 5 MG tablet Take 1 tablet (5 mg total) by mouth daily.  30 tablet  6  . potassium chloride  SA (K-DUR,KLOR-CON) 20 MEQ tablet Take 1 tablet (20 mEq total) by mouth 3 (three) times daily.  90 tablet  6  . traMADol (ULTRAM) 50 MG tablet Take 50 mg by mouth every 8 (eight) hours as needed. For pain      . warfarin (COUMADIN) 2.5 MG tablet Please do not take your coumadin today (10/23/11). Tomorrow (10/24/11) please take 2.5mg . Monday (10/25/11) take 5mg . Come to the office to have your blood checked on Tuesday 9/17 for further instructions on dosing.  60 tablet  3    Allergies: Allergies  Allergen Reactions  . Penicillins Other (See Comments)    unknown  . Tetanus-Diphtheria Toxoids Td Other (See Comments)    unknown  . Latex Hives and Rash    History  Substance Use Topics  . Smoking status: Never Smoker   . Smokeless tobacco: Current User    Types: Snuff     Comment: Uses Snuff regularly.  Dips several x/day.  A "large can" can last her up to 3 mos.  . Alcohol Use: No     ROS:  Please see the history of present illness.  She has dark stools. She has a cough with clear sputum. She denies true melena. She denies hematochezia.   All other systems reviewed and negative.   PHYSICAL EXAM: VS:  BP 118/66  Pulse 81  Resp 21  Ht 5\' 2"  (1.575 m)  Wt 262 lb 12.8 oz (119.205 kg)  BMI 48.07 kg/m2  SpO2 94% Well nourished, well developed, in no acute distress HEENT: normal Neck: + JVD at 90 Cardiac:  normal S1, S2; regular rate and rhythm; 1-2/6 systolic ejection murmur heard best along the left lower sternal border Lungs:  Clear to auscultation bilaterally, no wheezing, rhonchi or rales  Abd: soft, nontender Ext: 1-2+ bilateral LE edema Skin: warm and dry Neuro:  CNs 2-12 intact, no focal abnormalities noted  EKG:  Sinus rhythm, heart rate 84, left axis deviation, no significant change since prior tracing   ASSESSMENT AND PLAN:  1. Acute on Chronic Combined Systolic and Diastolic CHF:  Her weight is up 10 pounds. She is more short of breath and her LE edema is worse. I had a long discussion with her regarding increased diuretics at home with close followup versus admission to the hospital. I reviewed this with Dr. Antoine Poche. He notes the patient had a prolonged stay when she was last admitted. We would like to avoid readmission if at all possible. She would likely do better if we can diurese her at home. She is agreeable to this. I will give her Zaroxolyn 5 mg to take today, tomorrow and the next day (total 3 doses). She will increase potassium to 60 mEq in the a.m. and 40 mEq in the PM (for 3 days only). Check a basic metabolic panel today and BNP.  Dr. Antoine Poche will see her Friday of this week. Repeat basic metabolic panel will be obtained at that time. Her daughter notes that she was previously on 80 mg of Lasix twice a day. We could consider  keeping her on that dose when seen in followup if her volume has improved.  2. Atrial Fibrillation: Maintaining sinus rhythm. CHADS2 score is 3. She is followed by the Coumadin clinic.   3. Hypertension: Controlled.  Signed, Tereso Newcomer, PA-C  11:44 AM 11/16/2011

## 2011-11-16 NOTE — Patient Instructions (Addendum)
Your physician recommends that you schedule a follow-up appointment in: WITH DR.HOCHREIN 11/19/11 PER DR. HOCHREIN; WITH A REPEAT BMET SAME DAY  Your physician has recommended you make the following change in your medication: START METOLAZONE 5 MG TABLET; YOU ARE TO TAKE 1 TABLET TODAY WITH YOUR NEXT DOSE OF LASIX; ON 10/9 AND 10/10 YOU ARE TO TAKE 1 5 MG TABLET WITH YOUR MORNING DOSE OF LASIX.  INCREASE YOUR POTASSIUM FOR THE NEXT 3 DAYS; 60 MEQ IN THE MORNING AND 40 MEQ IN THE EVENING AFTER THE 3 DAYS GO BACK TO YOUR PREVIOUS DOSE  Your physician recommends that you return for lab work in: TODAY BMET, BMP  WEIGH DAILY AND CALL IF YOUR WEIGHT IS INCREASED IN DAY BY > 3 LB OR IF YOUR WEIGHT IS NOT EVEN GOING DOWN [id number]. PER SCOTT WEAVER, PAC IF YOU DO NOT FEEL ANY BETTER AND YOUR MORE SHORT OF BREATH AND HAVE MORE SWELLING TO GO TO THE ER   2 Gram Low Sodium Diet A 2 gram sodium diet restricts the amount of sodium in the diet to no more than 2 g or 2000 mg daily. Limiting the amount of sodium is often used to help lower blood pressure. It is important if you have heart, liver, or kidney problems. Many foods contain sodium for flavor and sometimes as a preservative. When the amount of sodium in a diet needs to be low, it is important to know what to look for when choosing foods and drinks. The following includes some information and guidelines to help make it easier for you to adapt to a low sodium diet. QUICK TIPS  Do not add salt to food.  Avoid convenience items and fast food.  Choose unsalted snack foods.  Buy lower sodium products, often labeled as "lower sodium" or "no salt added."  Check food labels to learn how much sodium is in 1 serving.  When eating at a restaurant, ask that your food be prepared with less salt or none, if possible. READING FOOD LABELS FOR SODIUM INFORMATION The nutrition facts label is a good place to find how much sodium is in foods. Look for products with  no more than 500 to 600 mg of sodium per meal and no more than 150 mg per serving. Remember that 2 g = 2000 mg. The food label may also list foods as:  Sodium-free: Less than 5 mg in a serving.  Very low sodium: 35 mg or less in a serving.  Low-sodium: 140 mg or less in a serving.  Light in sodium: 50% less sodium in a serving. For example, if a food that usually has 300 mg of sodium is changed to become light in sodium, it will have 150 mg of sodium.  Reduced sodium: 25% less sodium in a serving. For example, if a food that usually has 400 mg of sodium is changed to reduced sodium, it will have 300 mg of sodium. CHOOSING FOODS Grains  Avoid: Salted crackers and snack items. Some cereals, including instant hot cereals. Bread stuffing and biscuit mixes. Seasoned rice or pasta mixes.  Choose: Unsalted snack items. Low-sodium cereals, oats, puffed wheat and rice, shredded wheat. [name] muffins and bread. Pasta. Meats  Avoid: Salted, canned, smoked, spiced, pickled meats, including fish and poultry. Bacon, ham, sausage, cold cuts, hot dogs, anchovies.  Choose: Low-sodium canned tuna and salmon. Fresh or frozen meat, poultry, and fish. Dairy  Avoid: Processed cheese and spreads. Cottage cheese. Buttermilk and condensed milk. Regular cheese.  Choose: Milk. Low-sodium cottage cheese. Yogurt. Sour cream. Low-sodium cheese. Fruits and Vegetables  Avoid: Regular canned vegetables. Regular canned tomato sauce and paste. Frozen vegetables in sauces. Olives. Rosita Fire. Relishes. Sauerkraut.  Choose: Low-sodium canned vegetables. Low-sodium tomato sauce and paste. Frozen or fresh vegetables. Fresh and frozen fruit. Condiments  Avoid: Canned and packaged gravies. Worcestershire sauce. Tartar sauce. Barbecue sauce. Soy sauce. Steak sauce. Ketchup. Onion, garlic, and table salt. Meat flavorings and tenderizers.  Choose: Fresh and dried herbs and spices. Low-sodium varieties of mustard and  ketchup. Lemon juice. Tabasco sauce. Horseradish. SAMPLE 2 GRAM SODIUM MEAL PLAN Breakfast / Sodium (mg)  1 cup low-fat milk / 143 mg  2 slices whole-wheat toast / 270 mg  1 tbs heart-healthy margarine / 153 mg  1 hard-boiled egg / 139 mg  1 small orange / 0 mg Lunch / Sodium (mg)  1 cup raw carrots / 76 mg   cup hummus / 298 mg  1 cup low-fat milk / 143 mg   cup red grapes / 2 mg  1 whole-wheat pita bread / 356 mg Dinner / Sodium (mg)  1 cup whole-wheat pasta / 2 mg  1 cup low-sodium tomato sauce / 73 mg  3 oz lean ground beef / 57 mg  1 small side salad (1 cup raw spinach leaves,  cup cucumber,  cup yellow bell pepper) with 1 tsp olive oil and 1 tsp red wine vinegar / 25 mg Snack / Sodium (mg)  1 container low-fat vanilla yogurt / 107 mg  3 graham cracker squares / 127 mg Nutrient Analysis  Calories: 2033  Protein: 77 g  Carbohydrate: 282 g  Fat: 72 g  Sodium: 1971 mg Document Released: 01/25/2005 Document Revised: 04/19/2011 Document Reviewed: 04/28/2009 [facility] Patient Information 2013 [place], [place].

## 2011-11-16 NOTE — Telephone Encounter (Signed)
New problem:  Need to discuss today office visit - regards to medication

## 2011-11-16 NOTE — Telephone Encounter (Signed)
lmom for Maralyn Sago w/[employer] [facility]

## 2011-11-17 ENCOUNTER — Telehealth: Payer: Self-pay | Admitting: *Deleted

## 2011-11-17 NOTE — Telephone Encounter (Signed)
s/w pt's daughter [name] about lab results, she verbalized understanding and asked if they still had to come back in 10/11 to see Dr. Antoine Poche, I said yes

## 2011-11-17 NOTE — Telephone Encounter (Signed)
Message copied by Tarri Fuller on Wed Nov 17, 2011 10:39 AM ------      Message from: [employer], [place] T      Created: Tue Nov 16, 2011  5:27 PM       Labs stable      Continue with current treatment plan.      Tereso Newcomer, PA-C  5:27 PM 11/16/2011

## 2011-11-19 ENCOUNTER — Encounter: Payer: Self-pay | Admitting: Cardiology

## 2011-11-19 ENCOUNTER — Ambulatory Visit (INDEPENDENT_AMBULATORY_CARE_PROVIDER_SITE_OTHER): Payer: Medicare Other | Admitting: Cardiology

## 2011-11-19 ENCOUNTER — Other Ambulatory Visit (INDEPENDENT_AMBULATORY_CARE_PROVIDER_SITE_OTHER): Payer: Medicare Other

## 2011-11-19 VITALS — BP 82/60 | HR 86 | Ht 61.0 in | Wt 252.0 lb

## 2011-11-19 DIAGNOSIS — R0989 Other specified symptoms and signs involving the circulatory and respiratory systems: Secondary | ICD-10-CM

## 2011-11-19 DIAGNOSIS — I5043 Acute on chronic combined systolic (congestive) and diastolic (congestive) heart failure: Secondary | ICD-10-CM

## 2011-11-19 LAB — BASIC METABOLIC PANEL
CO2: 30 mEq/L (ref 19–32)
Calcium: 8.8 mg/dL (ref 8.4–10.5)
Creatinine, Ser: 1.1 mg/dL (ref 0.4–1.2)

## 2011-11-19 NOTE — Patient Instructions (Addendum)
The current medical regimen is effective;  continue present plan and medications.  Follow up in 2 weeks with Scott Weaver, PA 

## 2011-11-19 NOTE — Progress Notes (Signed)
HPI The patient presents for followup of her mixed heart failure. She was recently seen in our office with volume overloaded. She had Zaroxolyn added briefly to her regimen. She now returns for followup and feels much better. Her lower extremity swelling is improved. She has lost 10 pounds. She was needing Nabs multiple packs per day and not particularly watching her salt. She has a scale at home but apparently he is established with one of the insurance companies but she doesn't use the scale. She says that her breathing is improved today. She's not describing any new PND or orthopnea. She's not having any palpitations, presyncope or syncope.  Allergies  Allergen Reactions  . Penicillins Other (See Comments)    unknown  . Tetanus-Diphtheria Toxoids Td Other (See Comments)    unknown  . Latex Hives and Rash    Current Outpatient Prescriptions  Medication Sig Dispense Refill  . amLODipine (NORVASC) 10 MG tablet Take 1 tablet (10 mg total) by mouth daily.  30 tablet  0  . bisacodyl (DULCOLAX) 5 MG EC tablet Take 5 mg by mouth daily. For      . carvedilol (COREG) 6.25 MG tablet Take 1 tablet (6.25 mg total) by mouth 2 (two) times daily with a meal.  60 tablet  6  . ferrous fumarate (FERRETTS) 325 (106 FE) MG TABS Take 1 tablet by mouth 2 (two) times daily.       . furosemide (LASIX) 40 MG tablet Take 1.5 tablets (60 mg total) by mouth 2 (two) times daily.  90 tablet  6  . HYDROcodone-acetaminophen (VICODIN) 5-500 MG per tablet Take 1 tablet by mouth every 8 (eight) hours as needed.       Marland Kitchen lisinopril (PRINIVIL,ZESTRIL) 5 MG tablet Take 1 tablet (5 mg total) by mouth daily.  30 tablet  6  . potassium chloride SA (K-DUR,KLOR-CON) 20 MEQ tablet Take 40 mEq by mouth 3 (three) times daily.      . traMADol (ULTRAM) 50 MG tablet Take 50 mg by mouth every 8 (eight) hours as needed. For pain      . warfarin (COUMADIN) 2.5 MG tablet Please do not take your coumadin today (10/23/11). Tomorrow (10/24/11)  please take 2.5mg . Monday (10/25/11) take 5mg . Come to the office to have your blood checked on Tuesday 9/17 for further instructions on dosing.  60 tablet  3  . DISCONTD: potassium chloride SA (K-DUR,KLOR-CON) 20 MEQ tablet Take 1 tablet (20 mEq total) by mouth 3 (three) times daily.  90 tablet  6  . metolazone (ZAROXOLYN) 5 MG tablet TODAY 5 MG WITH LASIX; 10/9 AND 10/10 TAKE 5 MG W/MORNING DOSE OF LASIX  10 tablet  2    Past Medical History  Diagnosis Date  . HTN (hypertension)   . Diverticulosis   . Hemorrhoid   . Osteoarthritis   . Constipation, chronic   . UTI (lower urinary tract infection)   . H/O: hysterectomy   . Systolic and diastolic CHF, chronic     a. 07/2011 Echo:  EF ~40%, mild MR, mild LAE, ?RV dysfunction  . Female bladder prolapse     with prolapse uterus, cystocele, s/p TVH/BSO 12/2006  . Obesity (BMI 30-39.9)     wt. 94.1 kg 12/2010  . Moderate mitral regurgitation     a. Mod to Sev by echo 01/2009;  b. mild MR by echo 2013  . Dysrhythmia 10/13/2011    atrial fibrilation, on coumadin  . Iron deficiency anemia  Past Surgical History  Procedure Date  . Wrist surgery   . Vaginal hysterectomy,cystocele and prolapse  repair NOV. 2008  . Abdominal hysterectomy     ROS:  Diffuse aches and pains.  Otherwise as stated in the HPI and negative for all other systems.  PHYSICAL EXAM BP 82/60  Pulse 86  Ht 5\' 1"  (1.549 m)  Wt 252 lb (114.306 kg)  BMI 47.61 kg/m2  SpO2 96% PHYSICAL EXAM GEN:  No distress NECK:  No jugular venous distention at 90 degrees, waveform within normal limits, carotid upstroke brisk and symmetric, no bruits, no thyromegaly, conjunctiva injected LYMPHATICS:  No cervical adenopathy LUNGS:  Clear to auscultation bilaterally BACK:  No CVA tenderness CHEST:  Unremarkable HEART:  S1 and S2 within normal limits, no S3, no S4, no clicks, no rubs, no murmurs ABD:  Positive bowel sounds normal in frequency in pitch, no bruits, no rebound, no  guarding, unable to assess midline mass or bruit with the patient seated. EXT:  2 plus pulses throughout, mild edema, no cyanosis no clubbing   ASSESSMENT AND PLAN  Acute on Chronic Combined Systolic and Diastolic CHF: Her weight is back down 10 pounds. I had a long discussion with her and her daughter about her eating habits.  We talked about getting back to the daily weights and she is going to call the company who supplies scale and reestablish this. I would very much like to keep her out of the hospital as he she does not do well in the hospital refusing physical  in therapy and language in the bed. We discussed this and the fact that she may need to consider nursing home placement if we cannot change her lifestyle at home. She and her daughter were engaged in his conversation. I will check a basic metabolic profile today.   Atrial Fibrillation: Maintaining sinus rhythm. CHADS2 score is 3. She is followed by the Coumadin  clinic. She is going to get her INR today.  Hypertension: Controlled.

## 2011-11-22 ENCOUNTER — Telehealth: Payer: Self-pay | Admitting: *Deleted

## 2011-11-22 ENCOUNTER — Ambulatory Visit: Payer: Self-pay | Admitting: Pharmacist

## 2011-11-22 ENCOUNTER — Ambulatory Visit: Payer: Medicare Other | Admitting: Physician Assistant

## 2011-11-22 DIAGNOSIS — I5043 Acute on chronic combined systolic (congestive) and diastolic (congestive) heart failure: Secondary | ICD-10-CM

## 2011-11-22 DIAGNOSIS — I4891 Unspecified atrial fibrillation: Secondary | ICD-10-CM

## 2011-11-22 DIAGNOSIS — Z7901 Long term (current) use of anticoagulants: Secondary | ICD-10-CM

## 2011-11-22 LAB — POCT INR: INR: 1.3

## 2011-11-22 NOTE — Telephone Encounter (Signed)
Message copied by Tarri Fuller on Mon Nov 22, 2011  3:26 PM ------      Message from: [place], [place] T      Created: Sun Nov 21, 2011  7:50 PM       Take K+ 20 mEq extra x 1      Repeat BMET in one week.      Tereso Newcomer, PA-C  7:50 PM 11/21/2011

## 2011-11-22 NOTE — Telephone Encounter (Signed)
s/w pt's daughter [name] about lab results and to have pt take 1 extra K+ today. pt coming in 10/24 to see PA we will ge t repeat bmet then

## 2011-11-29 ENCOUNTER — Ambulatory Visit: Payer: Self-pay | Admitting: Cardiology

## 2011-11-29 ENCOUNTER — Telehealth: Payer: Self-pay | Admitting: Cardiology

## 2011-11-29 DIAGNOSIS — Z7901 Long term (current) use of anticoagulants: Secondary | ICD-10-CM

## 2011-11-29 DIAGNOSIS — I4891 Unspecified atrial fibrillation: Secondary | ICD-10-CM

## 2011-11-29 LAB — POCT INR: INR: 2.3

## 2011-11-29 NOTE — Telephone Encounter (Signed)
[name] will call back if pt is still wanting to have surgery.

## 2011-11-29 NOTE — Telephone Encounter (Signed)
Notified [name] that according to office notes, [name] has not been cleared for surgery.  No surgical date has been set yet.

## 2011-11-29 NOTE — Telephone Encounter (Signed)
plz return call to [name]-Dr. McDermot [phone]  ext [phone]   Patient was came for surgical clearance appnt on 10/13/11, plz return call to McDermot with results.

## 2011-12-02 ENCOUNTER — Encounter: Payer: Self-pay | Admitting: Physician Assistant

## 2011-12-02 ENCOUNTER — Ambulatory Visit (INDEPENDENT_AMBULATORY_CARE_PROVIDER_SITE_OTHER): Payer: Medicare Other | Admitting: Physician Assistant

## 2011-12-02 VITALS — BP 130/68 | HR 73 | Resp 20 | Ht 61.0 in | Wt 245.8 lb

## 2011-12-02 DIAGNOSIS — I5042 Chronic combined systolic (congestive) and diastolic (congestive) heart failure: Secondary | ICD-10-CM

## 2011-12-02 DIAGNOSIS — R609 Edema, unspecified: Secondary | ICD-10-CM

## 2011-12-02 DIAGNOSIS — I10 Essential (primary) hypertension: Secondary | ICD-10-CM

## 2011-12-02 DIAGNOSIS — I428 Other cardiomyopathies: Secondary | ICD-10-CM

## 2011-12-02 DIAGNOSIS — I429 Cardiomyopathy, unspecified: Secondary | ICD-10-CM

## 2011-12-02 MED ORDER — AMLODIPINE BESYLATE 5 MG PO TABS: 5.0000 mg | ORAL_TABLET | Freq: Every day | ORAL | Status: DC

## 2011-12-02 MED ORDER — LISINOPRIL 10 MG PO TABS: 10.0000 mg | ORAL_TABLET | Freq: Every day | ORAL | Status: DC

## 2011-12-02 NOTE — Progress Notes (Signed)
[address] [place], [place]  [address] Phone: [phone] Fax:  [phone]  Date:  12/02/2011   Name:  [name]   DOB:  Jun 21, 1934   MRN:  [id number]  PCP:  Astrid Divine, MD  Primary Cardiologist:  Dr. Rollene Rotunda  Primary Electrophysiologist:  None    History of Present Illness: [name] is a 77 y.o. female who returns for f/u on CHF.  She has a hx of combined systolic and diastolic CHF, DCM with an EF of 40%, HTN. Admitted in 07/2011 10/2011 with a/c CHF.  She was noted to be in new onset atrial fibrillation in 10/2011.  She was initiated on Coumadin.  Of note, she spontaneously converted to sinus rhythm.  She was seen in follow up on 10/8 and had gained 10 lbs.  We increased her diuresis and she saw Dr. Rollene Rotunda back several days later.  Her weight was down.  She was counseled on salt intake and to monitor her weights.  She returns for follow up.  She is doing well. Her weight is down. Her breathing is improved. She denies chest pain, orthopnea, PND or syncope. She continues to have difficulty with LE edema. She is watching her weights carefully. She is also watching her salt.  Labs (9/13):    K 4.4, creatinine 1.2, hemoglobin 11.1, TSH 1.782 Labs (10/13):  K 3.3, creatinine 1.1   Wt Readings from Last 3 Encounters:  12/02/11 245 lb 12.8 oz (111.494 kg)  11/19/11 252 lb (114.306 kg)  11/16/11 262 lb 12.8 oz (119.205 kg)     Past Medical History  Diagnosis Date  . HTN (hypertension)   . Diverticulosis   . Hemorrhoid   . Osteoarthritis   . Constipation, chronic   . H/O: hysterectomy   . Systolic and diastolic CHF, chronic     a. Echo 07/2011: Technically limited; inferior HK, inferolateral and possibly lateral wall HK, EF 40%, moderate LVE, aortic sclerosis without stenosis, mild MR, mild LAE, question RV dysfunction  . Female bladder prolapse     with prolapse uterus, cystocele, s/p TVH/BSO 12/2006  . Obesity (BMI  30-39.9)     wt. 94.1 kg 12/2010  . Moderate mitral regurgitation     a. Mod to Sev by echo 01/2009;  b. mild MR by echo 2013  . Atrial fibrillation 10/13/2011    coumadin Rx  . Iron deficiency anemia     Current Outpatient Prescriptions  Medication Sig Dispense Refill  . amLODipine (NORVASC) 10 MG tablet Take 1 tablet (10 mg total) by mouth daily.  30 tablet  0  . bisacodyl (DULCOLAX) 5 MG EC tablet Take 5 mg by mouth daily. For      . carvedilol (COREG) 6.25 MG tablet Take 1 tablet (6.25 mg total) by mouth 2 (two) times daily with a meal.  60 tablet  6  . ferrous fumarate (FERRETTS) 325 (106 FE) MG TABS Take 1 tablet by mouth 2 (two) times daily.       . furosemide (LASIX) 40 MG tablet Take 1.5 tablets (60 mg total) by mouth 2 (two) times daily.  90 tablet  6  . HYDROcodone-acetaminophen (VICODIN) 5-500 MG per tablet Take 1 tablet by mouth every 8 (eight) hours as needed.       Marland Kitchen lisinopril (PRINIVIL,ZESTRIL) 5 MG tablet Take 1 tablet (5 mg total) by mouth daily.  30 tablet  6  . potassium chloride SA (K-DUR,KLOR-CON) 20 MEQ tablet Take  40 mEq by mouth 3 (three) times daily.      . traMADol (ULTRAM) 50 MG tablet Take 50 mg by mouth every 8 (eight) hours as needed. For pain      . warfarin (COUMADIN) 2.5 MG tablet Please do not take your coumadin today (10/23/11). Tomorrow (10/24/11) please take 2.5mg . Monday (10/25/11) take 5mg . Come to the office to have your blood checked on Tuesday 9/17 for further instructions on dosing.  60 tablet  3  . metolazone (ZAROXOLYN) 5 MG tablet TODAY 5 MG WITH LASIX; 10/9 AND 10/10 TAKE 5 MG W/MORNING DOSE OF LASIX  10 tablet  2    Allergies: Allergies  Allergen Reactions  . Penicillins Other (See Comments)    unknown  . Tetanus-Diphtheria Toxoids Td Other (See Comments)    unknown  . Latex Hives and Rash   Social History:    reports that she has never smoked. Her smokeless tobacco use includes Snuff. She reports that she does not drink alcohol or use  illicit drugs.   ROS:  Please see the history of present illness.   All other systems reviewed and negative.   PHYSICAL EXAM: VS:  BP 130/68  Pulse 73  Resp 20  Ht 5\' 1"  (1.549 m)  Wt 245 lb 12.8 oz (111.494 kg)  BMI 46.44 kg/m2  SpO2 98% Well nourished, well developed, in no acute distress HEENT: normal Neck: no JVD at 90 Cardiac:  normal S1, S2; regular rate and rhythm; 1-2/6 systolic ejection murmur heard best along the left lower sternal border Lungs:  Clear to auscultation bilaterally, no wheezing, rhonchi or rales  Abd: soft, nontender Ext: 1+ bilateral LE edema Skin: warm and dry Neuro:  CNs 2-12 intact, no focal abnormalities noted  EKG:  NSR, HR 72, LAD, T-wave inversions in 1, 2, aVL, aVF, V5-V6, no change from prior tracing  ASSESSMENT AND PLAN:  1. Chronic Combined Systolic and Diastolic CHF:   Volume is stable. Her weights have come down nicely. She is concerned about her LE edema. I suspect that the amlodipine is likely worsening this somewhat. I will check a basic metabolic panel today. Continue same dose of Lasix for now.  I will reduce her amlodipine to 5 mg daily. I will increase her lisinopril to 10 mg daily to make sure her blood pressure stays well controlled. I will check another basic metabolic panel in one week. I will see her back in 3 weeks. I will give her specific instructions reguarding her weights. If she sees a weight gain of 3 pounds in 1 day or 5 pounds in 1 week, she can take metolazone 5 mg times one as well as an extra potassium.  2. Atrial Fibrillation:   Maintaining sinus rhythm. CHADS2 score is 3. She is followed by the Coumadin clinic.   3. Hypertension:   Controlled.  [name], PA-C  11:38 AM 12/02/2011

## 2011-12-02 NOTE — Patient Instructions (Addendum)
1. Decrease Amlodipine to 5 mg daily.  You can take 1/2 tablet of the 10 mg tablets you have now.  I have sent a new prescription in for you for the 5 mg tablets.  2. Increase Lisinopril to 10 mg daily.  You can take 2 of the 5 mg tablets that you have now.  I have sent in a new prescription for you.  3. Weigh daily.  If weight goes up 3 lbs in one day or 5 lbs total in one week, take Metolazone 5 mg with your morning Lasix that day ONLY.  Take an extra Potassium 20 mEq (1 extra tab only) that day ONLY.  Call us when you have to do this.  4. Follow up with Tereso Newcomer, PA-C in 3 weeks.   12/23/11 @ 11:10 AM SAME DAY WITH DR. HOCHREIN IN THE OFFICE  PLEASE HAVE THE HOME HEALTH NURSE GET A BMET IN 1 WEEK WITH RESULTS FAXED TO [place], [place] [phone]

## 2011-12-09 ENCOUNTER — Ambulatory Visit: Payer: Self-pay | Admitting: Internal Medicine

## 2011-12-09 DIAGNOSIS — I4891 Unspecified atrial fibrillation: Secondary | ICD-10-CM

## 2011-12-09 DIAGNOSIS — Z7901 Long term (current) use of anticoagulants: Secondary | ICD-10-CM

## 2011-12-10 ENCOUNTER — Other Ambulatory Visit: Payer: Self-pay | Admitting: Physician Assistant

## 2011-12-15 ENCOUNTER — Ambulatory Visit: Payer: Self-pay | Admitting: Cardiology

## 2011-12-15 DIAGNOSIS — I4891 Unspecified atrial fibrillation: Secondary | ICD-10-CM

## 2011-12-15 DIAGNOSIS — Z7901 Long term (current) use of anticoagulants: Secondary | ICD-10-CM

## 2011-12-22 ENCOUNTER — Ambulatory Visit: Payer: Self-pay | Admitting: Cardiology

## 2011-12-22 DIAGNOSIS — Z7901 Long term (current) use of anticoagulants: Secondary | ICD-10-CM

## 2011-12-22 DIAGNOSIS — I4891 Unspecified atrial fibrillation: Secondary | ICD-10-CM

## 2011-12-23 ENCOUNTER — Encounter: Payer: Self-pay | Admitting: Physician Assistant

## 2011-12-23 ENCOUNTER — Ambulatory Visit (INDEPENDENT_AMBULATORY_CARE_PROVIDER_SITE_OTHER): Payer: Medicare Other | Admitting: Physician Assistant

## 2011-12-23 VITALS — BP 100/62 | HR 74 | Ht 61.0 in | Wt 240.0 lb

## 2011-12-23 DIAGNOSIS — I4891 Unspecified atrial fibrillation: Secondary | ICD-10-CM

## 2011-12-23 DIAGNOSIS — I10 Essential (primary) hypertension: Secondary | ICD-10-CM

## 2011-12-23 DIAGNOSIS — I5042 Chronic combined systolic (congestive) and diastolic (congestive) heart failure: Secondary | ICD-10-CM

## 2011-12-23 LAB — BASIC METABOLIC PANEL
CO2: 23 mEq/L (ref 19–32)
Calcium: 9.3 mg/dL (ref 8.4–10.5)
Glucose, Bld: 97 mg/dL (ref 70–99)
Sodium: 138 mEq/L (ref 135–145)

## 2011-12-23 NOTE — Patient Instructions (Addendum)
BMET TODAY  YOU GET SOME OTC MUCINEX 600 MG 1 TABLET TWICE DAILY AS NEEDED FOR CONGESTION  HAVE [facility] CHECK YOUR BLOOD PRESSURE ONCE A DAY; AFTER 1 WEEK HAVE BP READINGS CALLED IN TO [place], [place] [phone]  Your physician recommends that you schedule a follow-up appointment in: 03/28/12 WITH DR. HOCHREIN @ 11:15 AM

## 2011-12-23 NOTE — Progress Notes (Signed)
[address], [address] [place], [place]  [address] Phone: [phone]; Fax:  [phone]  Date:  12/23/2011   Name:  [name]   DOB:  07/22/1934   MRN:  [id number]  PCP:  Astrid Divine, MD  Primary Cardiologist:  Dr. Rollene Rotunda  Primary Electrophysiologist:  None    History of Present Illness: [name] is a 77 y.o. female who returns for f/u on CHF.  She has a hx of combined systolic and diastolic CHF, DCM with an EF of 40%, HTN. She has had several admissions this year with a/c CHF.  She was noted to be in new onset atrial fibrillation in 10/2011.  She was initiated on Coumadin.  Of note, she spontaneously converted to sinus rhythm.  She recently had volume overload and her diuretics were adjusted. I saw her 12/02/11. She was improved at that time. I decreased her Norvasc to see if this would help with her swelling. I also increased her lisinopril. Unfortunately she did not get a basic metabolic panel last time. Since last seen, she is doing well. She denies significant chest discomfort. Her dyspnea with exertion is improved. She denies orthopnea or PND. She denies syncope. She does have some sinus congestion.  Labs (9/13):    K 4.4, creatinine 1.2, hemoglobin 11.1, TSH 1.782 Labs (10/13):  K 3.3, creatinine 1.1   Wt Readings from Last 3 Encounters:  12/23/11 240 lb (108.863 kg)  12/02/11 245 lb 12.8 oz (111.494 kg)  11/19/11 252 lb (114.306 kg)     Past Medical History  Diagnosis Date  . HTN (hypertension)   . Diverticulosis   . Hemorrhoid   . Osteoarthritis   . Constipation, chronic   . H/O: hysterectomy   . Systolic and diastolic CHF, chronic     a. Echo 07/2011: Technically limited; inferior HK, inferolateral and possibly lateral wall HK, EF 40%, moderate LVE, aortic sclerosis without stenosis, mild MR, mild LAE, question RV dysfunction  . Female bladder prolapse     with prolapse uterus, cystocele, s/p TVH/BSO 12/2006  . Obesity  (BMI 30-39.9)     wt. 94.1 kg 12/2010  . Moderate mitral regurgitation     a. Mod to Sev by echo 01/2009;  b. mild MR by echo 2013  . Atrial fibrillation 10/13/2011    coumadin Rx  . Iron deficiency anemia     Current Outpatient Prescriptions  Medication Sig Dispense Refill  . amLODipine (NORVASC) 5 MG tablet Take 1 tablet (5 mg total) by mouth daily.  30 tablet  6  . bisacodyl (DULCOLAX) 5 MG EC tablet Take 5 mg by mouth daily. For      . carvedilol (COREG) 6.25 MG tablet Take 1 tablet (6.25 mg total) by mouth 2 (two) times daily with a meal.  60 tablet  6  . ferrous fumarate (FERRETTS) 325 (106 FE) MG TABS Take 1 tablet by mouth 2 (two) times daily.       . furosemide (LASIX) 40 MG tablet Take 1.5 tablets (60 mg total) by mouth 2 (two) times daily.  90 tablet  6  . HYDROcodone-acetaminophen (VICODIN) 5-500 MG per tablet Take 1 tablet by mouth every 8 (eight) hours as needed.       Marland Kitchen lisinopril (PRINIVIL,ZESTRIL) 10 MG tablet Take 1 tablet (10 mg total) by mouth daily.  30 tablet  6  . metolazone (ZAROXOLYN) 5 MG tablet Take with morning dose of lasix  30 tablet  11  .  potassium chloride SA (K-DUR,KLOR-CON) 20 MEQ tablet Take 40 mEq by mouth 3 (three) times daily.      . traMADol (ULTRAM) 50 MG tablet Take 50 mg by mouth every 8 (eight) hours as needed. For pain      . warfarin (COUMADIN) 2.5 MG tablet Please do not take your coumadin today (10/23/11). Tomorrow (10/24/11) please take 2.5mg . Monday (10/25/11) take 5mg . Come to the office to have your blood checked on Tuesday 9/17 for further instructions on dosing.  60 tablet  3    Allergies: Allergies  Allergen Reactions  . Penicillins Other (See Comments)    unknown  . Tetanus-Diphtheria Toxoids Td Other (See Comments)    unknown  . Latex Hives and Rash   Social History: The patient   reports that she has never smoked. Her smokeless tobacco use includes Snuff. She reports that she does not drink alcohol or use illicit drugs.   ROS:   Please see the history of present illness.   All other systems reviewed and negative.   PHYSICAL EXAM: VS:  BP 100/62  Pulse 74  Ht 5\' 1"  (1.549 m)  Wt 240 lb (108.863 kg)  BMI 45.35 kg/m2 Well nourished, well developed, in no acute distress HEENT: normal Neck: no JVD at 90 Cardiac:  normal S1, S2; regular rate and rhythm; 1-2/6 systolic ejection murmur heard best along the left lower sternal border Lungs:  Clear to auscultation bilaterally, no wheezing, rhonchi or rales  Abd: soft, nontender Ext: Very trace bilateral LE edema Skin: warm and dry Neuro:  CNs 2-12 intact, no focal abnormalities noted  EKG:  NSR, HR 74, LAD, poor R wave progression  ASSESSMENT AND PLAN:  1. Chronic Combined Systolic and Diastolic CHF:   Volume is stable. She looks the best I have seen her. Continue current diuretic regimen. Check a basic metabolic panel today to followup on renal function and potassium. Followup with Dr. Antoine Poche in 3 months.  2. Atrial Fibrillation:   Maintaining sinus rhythm. CHADS2 score is 3. She is followed by the Coumadin clinic.   3. Hypertension:   Blood pressure somewhat low. I will discontinue amlodipine. She will keep an eye on her blood pressures at home over the next week and contact us.  4. Allergic Rhinitis:  I advised her to avoid decongestants as this would increase her blood pressure and risk of recurrent atrial fibrillation. I advised to get Mucinex over-the-counter to help with decreasing mucus. I offered her prescription for Flonase but she declined.   [name], PA-C  12:02 PM 12/23/2011

## 2011-12-24 ENCOUNTER — Telehealth: Payer: Self-pay | Admitting: *Deleted

## 2011-12-24 NOTE — Telephone Encounter (Signed)
Message copied by Tarri Fuller on Fri Dec 24, 2011 12:38 PM ------      Message from: [place], [place] T      Created: Thu Dec 23, 2011  4:48 PM       Looks good.      Continue with current treatment plan.      Tereso Newcomer, PA-C  4:48 PM 12/23/2011

## 2011-12-24 NOTE — Telephone Encounter (Signed)
daughter Reggie notified about lab results today w/verbal understanding

## 2012-01-03 ENCOUNTER — Ambulatory Visit (INDEPENDENT_AMBULATORY_CARE_PROVIDER_SITE_OTHER): Payer: Medicare Other | Admitting: Pharmacist

## 2012-01-03 DIAGNOSIS — I4891 Unspecified atrial fibrillation: Secondary | ICD-10-CM

## 2012-01-03 DIAGNOSIS — Z7901 Long term (current) use of anticoagulants: Secondary | ICD-10-CM

## 2012-01-13 ENCOUNTER — Telehealth: Payer: Self-pay | Admitting: Cardiology

## 2012-01-13 NOTE — Telephone Encounter (Signed)
EF given to heart failure nurse.

## 2012-01-13 NOTE — Telephone Encounter (Signed)
New Problem: ° ° ° °Called in wanting to know what the patient's latest Ejection Fraction was.  Please call back. °

## 2012-01-18 ENCOUNTER — Ambulatory Visit (INDEPENDENT_AMBULATORY_CARE_PROVIDER_SITE_OTHER): Payer: Medicare Other | Admitting: *Deleted

## 2012-01-18 DIAGNOSIS — Z7901 Long term (current) use of anticoagulants: Secondary | ICD-10-CM

## 2012-01-18 DIAGNOSIS — I4891 Unspecified atrial fibrillation: Secondary | ICD-10-CM

## 2012-01-28 ENCOUNTER — Ambulatory Visit (INDEPENDENT_AMBULATORY_CARE_PROVIDER_SITE_OTHER): Payer: Medicare Other | Admitting: Pharmacist

## 2012-01-28 DIAGNOSIS — Z7901 Long term (current) use of anticoagulants: Secondary | ICD-10-CM

## 2012-01-28 DIAGNOSIS — I4891 Unspecified atrial fibrillation: Secondary | ICD-10-CM

## 2012-01-28 LAB — POCT INR: INR: 1.6

## 2012-02-14 ENCOUNTER — Ambulatory Visit (INDEPENDENT_AMBULATORY_CARE_PROVIDER_SITE_OTHER): Payer: Medicare Other

## 2012-02-14 DIAGNOSIS — Z7901 Long term (current) use of anticoagulants: Secondary | ICD-10-CM

## 2012-02-14 DIAGNOSIS — I4891 Unspecified atrial fibrillation: Secondary | ICD-10-CM

## 2012-02-28 ENCOUNTER — Ambulatory Visit (INDEPENDENT_AMBULATORY_CARE_PROVIDER_SITE_OTHER): Payer: Medicare Other | Admitting: Pharmacist

## 2012-02-28 DIAGNOSIS — Z7901 Long term (current) use of anticoagulants: Secondary | ICD-10-CM

## 2012-02-28 DIAGNOSIS — I4891 Unspecified atrial fibrillation: Secondary | ICD-10-CM

## 2012-02-28 LAB — POCT INR: INR: 1.6

## 2012-03-21 ENCOUNTER — Other Ambulatory Visit: Payer: Self-pay | Admitting: *Deleted

## 2012-03-21 MED ORDER — WARFARIN SODIUM 2.5 MG PO TABS: 2.5000 mg | ORAL_TABLET | ORAL | Status: DC

## 2012-03-28 ENCOUNTER — Encounter: Payer: Self-pay | Admitting: Cardiology

## 2012-03-28 ENCOUNTER — Ambulatory Visit (INDEPENDENT_AMBULATORY_CARE_PROVIDER_SITE_OTHER): Payer: Medicare Other | Admitting: *Deleted

## 2012-03-28 ENCOUNTER — Ambulatory Visit (INDEPENDENT_AMBULATORY_CARE_PROVIDER_SITE_OTHER): Payer: Medicare Other | Admitting: Cardiology

## 2012-03-28 VITALS — BP 130/80 | HR 76 | Ht 61.0 in | Wt 251.0 lb

## 2012-03-28 DIAGNOSIS — I4891 Unspecified atrial fibrillation: Secondary | ICD-10-CM

## 2012-03-28 DIAGNOSIS — I10 Essential (primary) hypertension: Secondary | ICD-10-CM

## 2012-03-28 DIAGNOSIS — I509 Heart failure, unspecified: Secondary | ICD-10-CM

## 2012-03-28 DIAGNOSIS — I340 Nonrheumatic mitral (valve) insufficiency: Secondary | ICD-10-CM

## 2012-03-28 DIAGNOSIS — Z7901 Long term (current) use of anticoagulants: Secondary | ICD-10-CM

## 2012-03-28 DIAGNOSIS — I059 Rheumatic mitral valve disease, unspecified: Secondary | ICD-10-CM

## 2012-03-28 DIAGNOSIS — I428 Other cardiomyopathies: Secondary | ICD-10-CM

## 2012-03-28 DIAGNOSIS — I5042 Chronic combined systolic (congestive) and diastolic (congestive) heart failure: Secondary | ICD-10-CM

## 2012-03-28 DIAGNOSIS — I5043 Acute on chronic combined systolic (congestive) and diastolic (congestive) heart failure: Secondary | ICD-10-CM

## 2012-03-28 LAB — BASIC METABOLIC PANEL
CO2: 26 mEq/L (ref 19–32)
Calcium: 9.1 mg/dL (ref 8.4–10.5)
Glucose, Bld: 79 mg/dL (ref 70–99)
Potassium: 4.6 mEq/L (ref 3.5–5.1)
Sodium: 139 mEq/L (ref 135–145)

## 2012-03-28 LAB — POCT INR: INR: 1.4

## 2012-03-28 NOTE — Progress Notes (Signed)
HPI The patient presents for followup of her mixed heart failure. She has not been back since salt. Since I last saw her she has had no new cardiovascular complaints although she has some chronic dyspnea. She has a very low functional level. She denies any chest pressure, neck or arm discomfort. She has no new palpitations, presyncope or syncope. She's had an 11 pound weight gain. She may be following her diet though I'm not sure. She's not weighing herself daily. She does have a scale.  Allergies  Allergen Reactions  . Penicillins Other (See Comments)    unknown  . Tetanus-Diphtheria Toxoids Td Other (See Comments)    unknown  . Latex Hives and Rash    Current Outpatient Prescriptions  Medication Sig Dispense Refill  . bisacodyl (DULCOLAX) 5 MG EC tablet Take 5 mg by mouth daily. For      . carvedilol (COREG) 6.25 MG tablet Take 1 tablet (6.25 mg total) by mouth 2 (two) times daily with a meal.  60 tablet  6  . ferrous fumarate (FERRETTS) 325 (106 FE) MG TABS Take 1 tablet by mouth 2 (two) times daily.       . furosemide (LASIX) 40 MG tablet Take 1.5 tablets (60 mg total) by mouth 2 (two) times daily.  90 tablet  6  . HYDROcodone-acetaminophen (VICODIN) 5-500 MG per tablet Take 1 tablet by mouth every 8 (eight) hours as needed.       Marland Kitchen lisinopril (PRINIVIL,ZESTRIL) 10 MG tablet Take 1 tablet (10 mg total) by mouth daily.  30 tablet  6  . potassium chloride SA (K-DUR,KLOR-CON) 20 MEQ tablet Take 40 mEq by mouth 3 (three) times daily.      . traMADol (ULTRAM) 50 MG tablet Take 50 mg by mouth every 8 (eight) hours as needed. For pain      . warfarin (COUMADIN) 2.5 MG tablet Take 1 tablet (2.5 mg total) by mouth as directed.  60 tablet  2  . metolazone (ZAROXOLYN) 5 MG tablet as needed. Take with morning dose of lasix       No current facility-administered medications for this visit.    Past Medical History  Diagnosis Date  . HTN (hypertension)   . Diverticulosis   . Hemorrhoid     . Osteoarthritis   . Constipation, chronic   . H/O: hysterectomy   . Systolic and diastolic CHF, chronic     a. Echo 07/2011: Technically limited; inferior HK, inferolateral and possibly lateral wall HK, EF 40%, moderate LVE, aortic sclerosis without stenosis, mild MR, mild LAE, question RV dysfunction  . Female bladder prolapse     with prolapse uterus, cystocele, s/p TVH/BSO 12/2006  . Obesity (BMI 30-39.9)     wt. 94.1 kg 12/2010  . Moderate mitral regurgitation     a. Mod to Sev by echo 01/2009;  b. mild MR by echo 2013  . Atrial fibrillation 10/13/2011    coumadin Rx  . Iron deficiency anemia     Past Surgical History  Procedure Laterality Date  . Wrist surgery    . Vaginal hysterectomy,cystocele and prolapse  repair  NOV. 2008  . Abdominal hysterectomy      ROS:  Diffuse aches and pains.  Otherwise as stated in the HPI and negative for all other systems.  PHYSICAL EXAM BP 130/80  Pulse 76  Ht 5\' 1"  (1.549 m)  Wt 251 lb (113.853 kg)  BMI 47.45 kg/m2 PHYSICAL EXAM GEN:  No distress NECK:  No jugular venous distention at 90 degrees, waveform within normal limits, carotid upstroke brisk and symmetric, no bruits, no thyromegaly, conjunctiva injected LYMPHATICS:  No cervical adenopathy LUNGS:  Clear to auscultation bilaterally BACK:  No CVA tenderness CHEST:  Unremarkable HEART:  S1 and S2 within normal limits, no S3, no S4, no clicks, no rubs, no murmurs ABD:  Positive bowel sounds normal in frequency in pitch, no bruits, no rebound, no guarding, unable to assess midline mass or bruit with the patient seated,morbidly obese EXT:  2 plus pulses throughout, mild edema, no cyanosis no clubbing   ASSESSMENT AND PLAN  Acute on Chronic Combined Systolic and Diastolic CHF:  Her weight is back up 11 pounds. She's not weighing herself daily. I will have her take Zaroxolyn 5 mg for the next 2 days in addition to her previous meds. She will need a basic metabolic profile today and  again in one week. We will see her in followup at that time.  Atrial Fibrillation:  She has had no symptomatic atrial fibrillation. She tolerates anticoagulation and she will continue with his therapy.  Hypertension: This is managed in the context of controlling her congestive heart failure. Her blood pressure is currently at target.

## 2012-03-28 NOTE — Patient Instructions (Addendum)
Please take Zaroxolyn 5 mg for 2 days. Continue all other medications as listed.  Please have blood work today and again in 1 week. (BMP)  Follow up with Tereso Newcomer, PA in 1 week.

## 2012-04-04 ENCOUNTER — Ambulatory Visit (INDEPENDENT_AMBULATORY_CARE_PROVIDER_SITE_OTHER): Payer: Medicare Other | Admitting: Physician Assistant

## 2012-04-04 ENCOUNTER — Encounter: Payer: Self-pay | Admitting: Physician Assistant

## 2012-04-04 ENCOUNTER — Ambulatory Visit (INDEPENDENT_AMBULATORY_CARE_PROVIDER_SITE_OTHER): Payer: Medicare Other | Admitting: *Deleted

## 2012-04-04 ENCOUNTER — Other Ambulatory Visit (INDEPENDENT_AMBULATORY_CARE_PROVIDER_SITE_OTHER): Payer: Medicare Other

## 2012-04-04 VITALS — BP 143/82 | HR 80 | Ht 61.0 in | Wt 253.4 lb

## 2012-04-04 DIAGNOSIS — R0602 Shortness of breath: Secondary | ICD-10-CM

## 2012-04-04 DIAGNOSIS — I5042 Chronic combined systolic (congestive) and diastolic (congestive) heart failure: Secondary | ICD-10-CM

## 2012-04-04 DIAGNOSIS — I4891 Unspecified atrial fibrillation: Secondary | ICD-10-CM

## 2012-04-04 DIAGNOSIS — I509 Heart failure, unspecified: Secondary | ICD-10-CM

## 2012-04-04 DIAGNOSIS — R0989 Other specified symptoms and signs involving the circulatory and respiratory systems: Secondary | ICD-10-CM

## 2012-04-04 DIAGNOSIS — Z7901 Long term (current) use of anticoagulants: Secondary | ICD-10-CM

## 2012-04-04 DIAGNOSIS — I10 Essential (primary) hypertension: Secondary | ICD-10-CM

## 2012-04-04 LAB — BASIC METABOLIC PANEL
BUN: 12 mg/dL (ref 6–23)
CO2: 26 mEq/L (ref 19–32)
Chloride: 103 mEq/L (ref 96–112)
Creatinine, Ser: 1 mg/dL (ref 0.4–1.2)

## 2012-04-04 NOTE — Progress Notes (Signed)
[address], [address] [place], [place]  [address] Phone: [phone], Fax:  [phone]  Date:  04/04/2012   ID:  [name], DOB Apr 29, 1934, MRN [id number]  PCP:  Astrid Divine, MD  Primary Cardiologist:  Dr. Rollene Rotunda     History of Present Illness: [name] is a 78 y.o. female who returns for follow up on CHF.  She has a hx of combined systolic and diastolic CHF, DCM with an EF of 40%, HTN. She had several admissions in 2013 with CHF. She was noted to be in new onset AFib in 10/2011 and placed on Coumadin. Of note, she spontaneously converted to sinus rhythm. She saw Dr. Antoine Poche 03/28/12 in followup. Her weight is up 11 pounds. He placed her on metolazone for 2 days. She was brought back today for close follow up.  She is not weighing herself at home.  It does not sound like she is limiting her salt.  She denies significant changes in her DOE.  She is NYHA Class III.  She denies orthopnea, PND, syncope or chest pain.  Notes LE edema is unchanged.    Labs (9/13):   K 4.4, creatinine 1.2, hemoglobin 11.1, TSH 1.782  Labs (10/13): K 3.3, creatinine 1.1 Labs (2/14):   K 4.6, creatinine 1.0   Wt Readings from Last 3 Encounters:  04/04/12 253 lb 6.4 oz (114.941 kg)  03/28/12 251 lb (113.853 kg)  12/23/11 240 lb (108.863 kg)     Past Medical History  Diagnosis Date  . HTN (hypertension)   . Diverticulosis   . Hemorrhoid   . Osteoarthritis   . Constipation, chronic   . H/O: hysterectomy   . Systolic and diastolic CHF, chronic     a. Echo 07/2011: Technically limited; inferior HK, inferolateral and possibly lateral wall HK, EF 40%, moderate LVE, aortic sclerosis without stenosis, mild MR, mild LAE, question RV dysfunction  . Female bladder prolapse     with prolapse uterus, cystocele, s/p TVH/BSO 12/2006  . Obesity (BMI 30-39.9)     wt. 94.1 kg 12/2010  . Moderate mitral regurgitation     a. Mod to Sev by echo 01/2009;  b. mild MR by echo 2013    . Atrial fibrillation 10/13/2011    coumadin Rx  . Iron deficiency anemia     Current Outpatient Prescriptions  Medication Sig Dispense Refill  . bisacodyl (DULCOLAX) 5 MG EC tablet Take 5 mg by mouth daily. For      . carvedilol (COREG) 6.25 MG tablet Take 1 tablet (6.25 mg total) by mouth 2 (two) times daily with a meal.  60 tablet  6  . ferrous fumarate (FERRETTS) 325 (106 FE) MG TABS Take 1 tablet by mouth 2 (two) times daily.       . furosemide (LASIX) 40 MG tablet Take 1.5 tablets (60 mg total) by mouth 2 (two) times daily.  90 tablet  6  . HYDROcodone-acetaminophen (VICODIN) 5-500 MG per tablet Take 1 tablet by mouth every 8 (eight) hours as needed.       Marland Kitchen lisinopril (PRINIVIL,ZESTRIL) 10 MG tablet Take 1 tablet (10 mg total) by mouth daily.  30 tablet  6  . metolazone (ZAROXOLYN) 5 MG tablet as needed. Take with morning dose of lasix      . potassium chloride SA (K-DUR,KLOR-CON) 20 MEQ tablet Take 40 mEq by mouth 3 (three) times daily.      . traMADol (ULTRAM) 50 MG tablet Take 50 mg  by mouth every 8 (eight) hours as needed. For pain      . warfarin (COUMADIN) 2.5 MG tablet Take 1 tablet (2.5 mg total) by mouth as directed.  60 tablet  2   No current facility-administered medications for this visit.    Allergies:    Allergies  Allergen Reactions  . Penicillins Other (See Comments)    unknown  . Tetanus-Diphtheria Toxoids Td Other (See Comments)    unknown  . Latex Hives and Rash    Social History:  The patient  reports that she has never smoked. Her smokeless tobacco use includes Snuff. She reports that she does not drink alcohol or use illicit drugs.   ROS:  Please see the history of present illness.    All other systems reviewed and negative.   PHYSICAL EXAM: VS:  BP 143/82  Pulse 80  Ht 5\' 1"  (1.549 m)  Wt 253 lb 6.4 oz (114.941 kg)  BMI 47.9 kg/m2 Well nourished, well developed, in no acute distress HEENT: normal Neck: no JVD at 90 Cardiac:  normal S1, S2;  RRR; no murmur Lungs:  clear to auscultation bilaterally, no wheezing, rhonchi or rales Abd: soft, nontender, no hepatomegaly Ext: very trace bilateral LE edema Skin: warm and dry Neuro:  CNs 2-12 intact, no focal abnormalities noted  EKG:  NSR, HR 80, LAD, PRWP, no change from prior      ASSESSMENT AND PLAN:  1. Chronic Combined Diastolic and Systolic CHF:  Weight remains up.  However, she does not look that volume overloaded. She took metolazone as Rx.  Continue current Rx for now.  Check a BMET and BNP today.  Adjust diuretics further if BNP higher.  Follow up again with me in 2 weeks.  We discussed the importance of watching her salt intake and weight herself.  We discussed contacting us when she is in her "yellow zone." 2. Atrial Fibrillation:  Maintaining NSR.  She remains on coumadin. 3. Hypertension:  Controlled. 4. Disposition:  Follow up with me in 2 weeks.  [name], PA-C  11:39 AM 04/04/2012

## 2012-04-04 NOTE — Patient Instructions (Signed)
Your physician recommends that you continue on your current medications as directed. Please refer to the Current Medication list given to you today.  Your physician recommends that you return for lab work in: today  Your physician recommends that you schedule a follow-up appointment in: 2 weeks  

## 2012-04-05 ENCOUNTER — Telehealth: Payer: Self-pay | Admitting: *Deleted

## 2012-04-05 NOTE — Telephone Encounter (Signed)
PC to patient to informed of lab results from BMET/BNP. No answer/unable to leave voicemail.

## 2012-04-12 ENCOUNTER — Ambulatory Visit (INDEPENDENT_AMBULATORY_CARE_PROVIDER_SITE_OTHER): Payer: Medicare Other | Admitting: *Deleted

## 2012-04-12 DIAGNOSIS — Z7901 Long term (current) use of anticoagulants: Secondary | ICD-10-CM

## 2012-04-12 DIAGNOSIS — I4891 Unspecified atrial fibrillation: Secondary | ICD-10-CM

## 2012-04-12 NOTE — Patient Instructions (Signed)
Reinforce medication compliance

## 2012-04-17 NOTE — Telephone Encounter (Signed)
Notes Recorded by Dossie Arbour, RN on 04/06/2012 at 3:21 PM Pt notified

## 2012-04-18 ENCOUNTER — Encounter: Payer: Self-pay | Admitting: Physician Assistant

## 2012-04-18 ENCOUNTER — Ambulatory Visit (INDEPENDENT_AMBULATORY_CARE_PROVIDER_SITE_OTHER): Payer: Medicare Other | Admitting: Pharmacist

## 2012-04-18 ENCOUNTER — Ambulatory Visit (INDEPENDENT_AMBULATORY_CARE_PROVIDER_SITE_OTHER): Payer: Medicare Other | Admitting: Physician Assistant

## 2012-04-18 VITALS — BP 132/82 | HR 77 | Ht 61.0 in | Wt 253.8 lb

## 2012-04-18 DIAGNOSIS — I5042 Chronic combined systolic (congestive) and diastolic (congestive) heart failure: Secondary | ICD-10-CM

## 2012-04-18 DIAGNOSIS — I4891 Unspecified atrial fibrillation: Secondary | ICD-10-CM

## 2012-04-18 DIAGNOSIS — I10 Essential (primary) hypertension: Secondary | ICD-10-CM

## 2012-04-18 DIAGNOSIS — Z7901 Long term (current) use of anticoagulants: Secondary | ICD-10-CM

## 2012-04-18 NOTE — Patient Instructions (Addendum)
Weigh yourself daily.  If you gain 3 or more pounds in one day, take one dose of metolazone and one extra Potassium tablet and call [facility].   Your physician recommends that you schedule a follow-up appointment in: 07/31/12 @ 10:45 WITH DR. HOCHREIN  NO CHANGES WERE MADE TODAY

## 2012-04-18 NOTE — Progress Notes (Signed)
[address], [address] [place], [place]  [address] Phone: [phone], Fax:  [phone]  Date:  04/18/2012   ID:  [name], DOB 03-28-1934, MRN [id number]  PCP:  Astrid Divine, MD  Primary Cardiologist:  Dr. Rollene Rotunda     History of Present Illness: [name] is a 78 y.o. female who returns for follow up on CHF.  She has a hx of combined systolic and diastolic CHF, DCM with an EF of 40%, HTN. She had several admissions in 2013 with CHF. She was noted to be in new onset AFib in 10/2011 and placed on Coumadin. Of note, she spontaneously converted to sinus rhythm.  She had been seen recently with increasing weights and Dr. Antoine Poche adjusted her diuretics.  I saw her last time and brought her back today for close follow up.  Volume appeared to be stable at last visit.  I have been contacted by the coumadin clinic regarding her compliance.  She is not taking her coumadin correctly and has not had a therapeutic INR since 12/2011.  She denies any change in her dyspnea.  She continues to be NYHA Class III.  Denies orthopnea, PND.  LE edema is stable.  No chest pain.  No syncope.  Labs (9/13):   K 4.4, creatinine 1.2, hemoglobin 11.1, TSH 1.782  Labs (10/13): K 3.3, creatinine 1.1 Labs (2/14):   K 4.6 => 4.4, creatinine 1.0 => 1.0, BNP 258  Wt Readings from Last 3 Encounters:  04/18/12 253 lb 12.8 oz (115.123 kg)  04/04/12 253 lb 6.4 oz (114.941 kg)  03/28/12 251 lb (113.853 kg)     Past Medical History  Diagnosis Date  . HTN (hypertension)   . Diverticulosis   . Hemorrhoid   . Osteoarthritis   . Constipation, chronic   . H/O: hysterectomy   . Systolic and diastolic CHF, chronic     a. Echo 07/2011: Technically limited; inferior HK, inferolateral and possibly lateral wall HK, EF 40%, moderate LVE, aortic sclerosis without stenosis, mild MR, mild LAE, question RV dysfunction  . Female bladder prolapse     with prolapse uterus, cystocele, s/p TVH/BSO  12/2006  . Obesity (BMI 30-39.9)     wt. 94.1 kg 12/2010  . Moderate mitral regurgitation     a. Mod to Sev by echo 01/2009;  b. mild MR by echo 2013  . Atrial fibrillation 10/13/2011    coumadin Rx  . Iron deficiency anemia     Current Outpatient Prescriptions  Medication Sig Dispense Refill  . bisacodyl (DULCOLAX) 5 MG EC tablet Take 5 mg by mouth daily. For      . carvedilol (COREG) 6.25 MG tablet Take 1 tablet (6.25 mg total) by mouth 2 (two) times daily with a meal.  60 tablet  6  . ferrous fumarate (FERRETTS) 325 (106 FE) MG TABS Take 1 tablet by mouth 2 (two) times daily.       . furosemide (LASIX) 40 MG tablet Take 1.5 tablets (60 mg total) by mouth 2 (two) times daily.  90 tablet  6  . HYDROcodone-acetaminophen (VICODIN) 5-500 MG per tablet Take 1 tablet by mouth every 8 (eight) hours as needed.       Marland Kitchen lisinopril (PRINIVIL,ZESTRIL) 10 MG tablet Take 1 tablet (10 mg total) by mouth daily.  30 tablet  6  . metolazone (ZAROXOLYN) 5 MG tablet as needed. Take with morning dose of lasix      . potassium chloride SA (K-DUR,KLOR-CON)  20 MEQ tablet Take 40 mEq by mouth 3 (three) times daily.      . traMADol (ULTRAM) 50 MG tablet Take 50 mg by mouth every 8 (eight) hours as needed. For pain      . warfarin (COUMADIN) 2.5 MG tablet Take 1 tablet (2.5 mg total) by mouth as directed.  60 tablet  2   No current facility-administered medications for this visit.    Allergies:    Allergies  Allergen Reactions  . Penicillins Other (See Comments)    unknown  . Tetanus-Diphtheria Toxoids Td Other (See Comments)    unknown  . Latex Hives and Rash    Social History:  The patient  reports that she has never smoked. Her smokeless tobacco use includes Snuff. She reports that she does not drink alcohol or use illicit drugs.   ROS:  Please see the history of present illness.  She has diffuse arthralgias.   All other systems reviewed and negative.   PHYSICAL EXAM: VS:  BP 132/82  Pulse 77  Ht  5\' 1"  (1.549 m)  Wt 253 lb 12.8 oz (115.123 kg)  BMI 47.98 kg/m2 Well nourished, well developed, in no acute distress HEENT: normal Neck: no JVD at 90 Cardiac:  normal S1, S2; RRR; no murmur Lungs:  clear to auscultation bilaterally, no wheezing, rhonchi or rales Abd: soft, nontender, no hepatomegaly Ext: very trace bilateral LE edema Skin: warm and dry Neuro:  CNs 2-12 intact, no focal abnormalities noted  EKG:  NSR, HR 77, LAD, PRWP, no change from prior      ASSESSMENT AND PLAN:  1. Chronic Combined Diastolic and Systolic CHF:  Weight is unchanged.  Volume appears to be stable.  Continue current Rx.  We discussed weighing daily and to take metolazone if weight increases by 3 lbs.   2. Atrial Fibrillation:  Maintaining NSR.  We had a long discussion regarding the importance of taking her coumadin correctly and its importance in preventing strokes.  With her non-compliance, she is not a good candidate for one of the NOACs.  We discussed this as well.  If she demonstrates good compliance with coumadin, and cost is not prohibitive, we could then consider NOACs.   3. Obesity:  We discussed the importance on continuing to increase her activity.  She could consider referral to PT with her PCP.  4. Hypertension:  Controlled. 5. Disposition:  Follow up with Dr. Rollene Rotunda in 3 mos.  [name], PA-C  11:27 AM 04/18/2012

## 2012-05-04 ENCOUNTER — Ambulatory Visit (INDEPENDENT_AMBULATORY_CARE_PROVIDER_SITE_OTHER): Payer: Medicare Other

## 2012-05-04 DIAGNOSIS — Z7901 Long term (current) use of anticoagulants: Secondary | ICD-10-CM

## 2012-05-04 DIAGNOSIS — I4891 Unspecified atrial fibrillation: Secondary | ICD-10-CM

## 2012-05-04 LAB — POCT INR: INR: 1.7

## 2012-05-18 ENCOUNTER — Ambulatory Visit (INDEPENDENT_AMBULATORY_CARE_PROVIDER_SITE_OTHER): Payer: Medicare Other | Admitting: *Deleted

## 2012-05-18 DIAGNOSIS — Z7901 Long term (current) use of anticoagulants: Secondary | ICD-10-CM

## 2012-05-18 DIAGNOSIS — I4891 Unspecified atrial fibrillation: Secondary | ICD-10-CM

## 2012-05-18 LAB — POCT INR: INR: 1.6

## 2012-06-01 ENCOUNTER — Ambulatory Visit (INDEPENDENT_AMBULATORY_CARE_PROVIDER_SITE_OTHER): Payer: Medicare Other

## 2012-06-01 DIAGNOSIS — Z7901 Long term (current) use of anticoagulants: Secondary | ICD-10-CM

## 2012-06-01 DIAGNOSIS — I4891 Unspecified atrial fibrillation: Secondary | ICD-10-CM

## 2012-06-13 ENCOUNTER — Other Ambulatory Visit: Payer: Self-pay

## 2012-06-13 DIAGNOSIS — I429 Cardiomyopathy, unspecified: Secondary | ICD-10-CM

## 2012-06-13 DIAGNOSIS — I5042 Chronic combined systolic (congestive) and diastolic (congestive) heart failure: Secondary | ICD-10-CM

## 2012-06-13 DIAGNOSIS — I10 Essential (primary) hypertension: Secondary | ICD-10-CM

## 2012-06-13 MED ORDER — LISINOPRIL 10 MG PO TABS: 10.0000 mg | ORAL_TABLET | Freq: Every day | ORAL | Status: DC

## 2012-06-13 NOTE — Telephone Encounter (Signed)
..   Requested Prescriptions   Signed Prescriptions Disp Refills  . lisinopril (PRINIVIL,ZESTRIL) 10 MG tablet 30 tablet 6    Sig: Take 1 tablet (10 mg total) by mouth daily.    Authorizing Provider: Rollene Rotunda    Ordering User: Christella Hartigan, Taylor Levick Judie Petit

## 2012-06-15 ENCOUNTER — Ambulatory Visit (INDEPENDENT_AMBULATORY_CARE_PROVIDER_SITE_OTHER): Payer: 59 | Admitting: *Deleted

## 2012-06-15 DIAGNOSIS — I4891 Unspecified atrial fibrillation: Secondary | ICD-10-CM

## 2012-06-15 DIAGNOSIS — Z7901 Long term (current) use of anticoagulants: Secondary | ICD-10-CM

## 2012-06-15 NOTE — Patient Instructions (Signed)
Reinforce anticoagulation medication compliance, instructed on risk of stroke

## 2012-06-25 ENCOUNTER — Emergency Department (HOSPITAL_COMMUNITY)
Admission: EM | Admit: 2012-06-25 | Discharge: 2012-06-26 | Disposition: A | Discharge status: Home / Self Care | Payer: Medicare Other | Attending: Emergency Medicine | Admitting: Emergency Medicine

## 2012-06-25 ENCOUNTER — Encounter (HOSPITAL_COMMUNITY): Payer: Self-pay | Admitting: *Deleted

## 2012-06-25 ENCOUNTER — Emergency Department (HOSPITAL_COMMUNITY): Payer: Medicare Other

## 2012-06-25 DIAGNOSIS — K5900 Constipation, unspecified: Secondary | ICD-10-CM | POA: Insufficient documentation

## 2012-06-25 DIAGNOSIS — I509 Heart failure, unspecified: Secondary | ICD-10-CM | POA: Insufficient documentation

## 2012-06-25 DIAGNOSIS — Z79899 Other long term (current) drug therapy: Secondary | ICD-10-CM | POA: Insufficient documentation

## 2012-06-25 DIAGNOSIS — E669 Obesity, unspecified: Secondary | ICD-10-CM | POA: Insufficient documentation

## 2012-06-25 DIAGNOSIS — Z8679 Personal history of other diseases of the circulatory system: Secondary | ICD-10-CM | POA: Insufficient documentation

## 2012-06-25 DIAGNOSIS — Z880 Allergy status to penicillin: Secondary | ICD-10-CM | POA: Insufficient documentation

## 2012-06-25 DIAGNOSIS — R52 Pain, unspecified: Secondary | ICD-10-CM

## 2012-06-25 DIAGNOSIS — Z8739 Personal history of other diseases of the musculoskeletal system and connective tissue: Secondary | ICD-10-CM | POA: Insufficient documentation

## 2012-06-25 DIAGNOSIS — M6281 Muscle weakness (generalized): Secondary | ICD-10-CM | POA: Insufficient documentation

## 2012-06-25 DIAGNOSIS — I10 Essential (primary) hypertension: Secondary | ICD-10-CM | POA: Insufficient documentation

## 2012-06-25 DIAGNOSIS — Z8719 Personal history of other diseases of the digestive system: Secondary | ICD-10-CM | POA: Insufficient documentation

## 2012-06-25 DIAGNOSIS — D509 Iron deficiency anemia, unspecified: Secondary | ICD-10-CM | POA: Insufficient documentation

## 2012-06-25 DIAGNOSIS — I4891 Unspecified atrial fibrillation: Secondary | ICD-10-CM | POA: Insufficient documentation

## 2012-06-25 DIAGNOSIS — Z87448 Personal history of other diseases of urinary system: Secondary | ICD-10-CM | POA: Insufficient documentation

## 2012-06-25 DIAGNOSIS — Z90710 Acquired absence of both cervix and uterus: Secondary | ICD-10-CM | POA: Insufficient documentation

## 2012-06-25 LAB — COMPREHENSIVE METABOLIC PANEL
Alkaline Phosphatase: 179 U/L — ABNORMAL HIGH (ref 39–117)
BUN: 11 mg/dL (ref 6–23)
Calcium: 9.4 mg/dL (ref 8.4–10.5)
Creatinine, Ser: 0.96 mg/dL (ref 0.50–1.10)
GFR calc Af Amer: 65 mL/min — ABNORMAL LOW (ref >90)
Glucose, Bld: 88 mg/dL (ref 70–99)
Potassium: 4.7 mEq/L (ref 3.5–5.1)
Total Protein: 7.5 g/dL (ref 6.0–8.3)

## 2012-06-25 LAB — CBC WITH DIFFERENTIAL/PLATELET
Eosinophils Absolute: 0.2 10*3/uL (ref 0.0–0.7)
Eosinophils Relative: 4 % (ref 0–5)
Hemoglobin: 13.2 g/dL (ref 12.0–15.0)
Lymphs Abs: 1.2 10*3/uL (ref 0.7–4.0)
MCH: 31 pg (ref 26.0–34.0)
MCHC: 32.9 g/dL (ref 30.0–36.0)
MCV: 94.1 fL (ref 78.0–100.0)
Monocytes Absolute: 0.3 10*3/uL (ref 0.1–1.0)
Monocytes Relative: 6 % (ref 3–12)
RBC: 4.26 MIL/uL (ref 3.87–5.11)

## 2012-06-25 LAB — URINALYSIS W MICROSCOPIC + REFLEX CULTURE
Glucose, UA: NEGATIVE mg/dL
Hgb urine dipstick: NEGATIVE
Ketones, ur: NEGATIVE mg/dL
Protein, ur: NEGATIVE mg/dL
Urobilinogen, UA: 0.2 mg/dL (ref 0.0–1.0)

## 2012-06-25 MED ORDER — IOHEXOL 300 MG/ML  SOLN
50.0000 mL | Freq: Once | INTRAMUSCULAR | Status: AC | PRN
  Administered 2012-06-25: 50 mL via ORAL

## 2012-06-25 MED ORDER — SODIUM CHLORIDE 0.9 % IV BOLUS (SEPSIS)
1000.0000 mL | Freq: Once | INTRAVENOUS | Status: AC
  Administered 2012-06-25: 1000 mL via INTRAVENOUS

## 2012-06-25 MED ORDER — HYDROCODONE-ACETAMINOPHEN 5-325 MG PO TABS
1.0000 | ORAL_TABLET | Freq: Once | ORAL | Status: AC
  Administered 2012-06-25: 1 via ORAL
  Filled 2012-06-25: qty 1

## 2012-06-25 NOTE — ED Notes (Signed)
In and out cath used to void- pt voided in bed pan mixed with bowel movement. Sterile technique used Robert EMT assisted.

## 2012-06-25 NOTE — ED Notes (Signed)
Pt reports generalized body pains x 2 weeks, denies any n/v/d or any other symptoms. No distress noted at triage.

## 2012-06-25 NOTE — ED Provider Notes (Signed)
History     CSN: [id number]  Arrival date & time 06/25/12  1827   First MD Initiated Contact with Patient 06/25/12 2002      Chief Complaint  Patient presents with  . Pain    (Consider location/radiation/quality/duration/timing/severity/associated sxs/prior treatment) HPI Comments: 78 y.o. female who presents to the Er w/ generalized muscle pain for the past 2 weeks. States her shoulder hurt, her legs hurt. States no fevers or chills. No chest pain no shortness of breath currently. She has had intermittent abdominal pain.    Patient is a 78 y.o. female presenting with general illness. The history is provided by the patient.  Illness  The current episode started more than 2 weeks ago. The problem occurs frequently. The problem has been unchanged. The problem is mild. Nothing relieves the symptoms. Nothing aggravates the symptoms. Pertinent negatives include no fever, no eye itching, no abdominal pain, no diarrhea, no vomiting, no headaches, no stridor, no neck pain, no cough, no wheezing, no rash, no eye discharge and no eye pain.    Past Medical History  Diagnosis Date  . HTN (hypertension)   . Diverticulosis   . Hemorrhoid   . Osteoarthritis   . Constipation, chronic   . H/O: hysterectomy   . Systolic and diastolic CHF, chronic     a. Echo 07/2011: Technically limited; inferior HK, inferolateral and possibly lateral wall HK, EF 40%, moderate LVE, aortic sclerosis without stenosis, mild MR, mild LAE, question RV dysfunction  . Female bladder prolapse     with prolapse uterus, cystocele, s/p TVH/BSO 12/2006  . Obesity (BMI 30-39.9)     wt. 94.1 kg 12/2010  . Moderate mitral regurgitation     a. Mod to Sev by echo 01/2009;  b. mild MR by echo 2013  . Atrial fibrillation 10/13/2011    coumadin Rx  . Iron deficiency anemia     Past Surgical History  Procedure Laterality Date  . Wrist surgery    . Vaginal hysterectomy,cystocele and prolapse  repair  NOV. 2008  . Abdominal  hysterectomy      Family History  Problem Relation Age of Onset  . Diabetes Brother   . Hypertension Mother     deceased @ 52  . Leukemia Brother   . Cancer Brother   . Stroke Mother   . Diabetes Father     deceased in his 81's    History  Substance Use Topics  . Smoking status: Never Smoker   . Smokeless tobacco: Current User    Types: Snuff     Comment: Uses Snuff regularly.  Dips several x/day.  A "large can" can last her up to 3 mos.  . Alcohol Use: No    OB History   Grav Para Term Preterm Abortions TAB SAB Ect Mult Living   6               Review of Systems  Constitutional: Negative for fever, chills and fatigue.  HENT: Negative for facial swelling, drooling, neck pain and dental problem.   Eyes: Negative for pain, discharge and itching.  Respiratory: Negative for cough, choking, wheezing and stridor.   Cardiovascular: Negative for chest pain.  Gastrointestinal: Negative for vomiting, abdominal pain and diarrhea.  Endocrine: Negative for cold intolerance and heat intolerance.  Genitourinary: Negative for vaginal discharge, difficulty urinating and vaginal pain.  Skin: Negative for pallor and rash.  Neurological: Positive for weakness. Negative for dizziness, light-headedness and headaches.  Psychiatric/Behavioral: Negative for behavioral problems and  agitation.    Allergies  Penicillins; Tetanus-diphtheria toxoids td; and Latex  Home Medications   Current Outpatient Rx  Name  Route  Sig  Dispense  Refill  . bisacodyl (DULCOLAX) 5 MG EC tablet   Oral   Take 5 mg by mouth daily. For         . carvedilol (COREG) 6.25 MG tablet   Oral   Take 1 tablet (6.25 mg total) by mouth 2 (two) times daily with a meal.   60 tablet   6   . ferrous fumarate (FERRETTS) 325 (106 FE) MG TABS   Oral   Take 1 tablet by mouth 2 (two) times daily.          . furosemide (LASIX) 40 MG tablet   Oral   Take 1.5 tablets (60 mg total) by mouth 2 (two) times daily.   90  tablet   6   . HYDROcodone-acetaminophen (VICODIN) 5-500 MG per tablet   Oral   Take 1 tablet by mouth every 8 (eight) hours as needed.          Marland Kitchen lisinopril (PRINIVIL,ZESTRIL) 10 MG tablet   Oral   Take 1 tablet (10 mg total) by mouth daily.   30 tablet   6   . metolazone (ZAROXOLYN) 5 MG tablet      as needed. Take with morning dose of lasix         . potassium chloride SA (K-DUR,KLOR-CON) 20 MEQ tablet   Oral   Take 40 mEq by mouth 3 (three) times daily.         . traMADol (ULTRAM) 50 MG tablet   Oral   Take 50 mg by mouth every 8 (eight) hours as needed. For pain         . warfarin (COUMADIN) 2.5 MG tablet   Oral   Take 1 tablet (2.5 mg total) by mouth as directed.   60 tablet   2     BP 134/79  Pulse 68  Temp(Src) 98.3 F (36.8 C) (Oral)  Resp 20  SpO2 98%  Physical Exam  Constitutional: She is oriented to person, place, and time. She appears well-developed. No distress.  HENT:  Head: Normocephalic and atraumatic.  Eyes: Pupils are equal, round, and reactive to light. Right eye exhibits no discharge. Left eye exhibits no discharge.  Neck: Neck supple. No tracheal deviation present.  Cardiovascular: Normal rate.  Exam reveals no gallop and no friction rub.   Pulmonary/Chest: No stridor. No respiratory distress. She has no wheezes.  Abdominal: Soft. She exhibits no distension. Tenderness: minimal diffuse ttp. There is no rebound.  Genitourinary:  External vaginal exam appears normal  Musculoskeletal: She exhibits no edema and no tenderness.  Neurological: She is alert and oriented to person, place, and time.  Skin: Skin is warm. She is not diaphoretic.  Generalized ttp of her skin    ED Course  Procedures (including critical care time)  Labs Reviewed  COMPREHENSIVE METABOLIC PANEL - Abnormal; Notable for the following:    Alkaline Phosphatase 179 (*)    GFR calc non Af Amer 56 (*)    GFR calc Af Amer 65 (*)    All other components within  normal limits  CBC WITH DIFFERENTIAL  CK  LIPASE, BLOOD  URINALYSIS W MICROSCOPIC + REFLEX CULTURE   Dg Chest 2 View  06/25/2012   *RADIOLOGY REPORT*  Clinical Data: Chest pain.  Hypertension.  Congestive heart failure.  Atrial fibrillation.  CHEST -  2 VIEW  Comparison: 10/14/2011  Findings: Mild cardiomegaly and tortuosity of thoracic aorta are stable.  Both lungs are clear.  No evidence of pleural effusion. No mass or lymphadenopathy identified.  IMPRESSION: Stable mild cardiomegaly.  No active lung disease.   Original Report Authenticated By: Myles Rosenthal, M.D.    MDM  Pt with constellation of complaints -- she is very comfortable on exam, joking around with staff, family members while in the Er. She has her baseline mobility, she has family members that care for her at home. Unclear etiology of diffuse muscle aches. Her CK is normal here. Will get ct abd pelvis. If CT abd pelvis is normal -- then will have pt f/u with pcp and her ob/gyn team for further eval and care of her symptoms.   1. Generalized pain            Bernadene Person, MD 06/27/12 1407

## 2012-06-26 ENCOUNTER — Encounter (HOSPITAL_COMMUNITY): Payer: Self-pay | Admitting: Radiology

## 2012-06-26 ENCOUNTER — Emergency Department (HOSPITAL_COMMUNITY): Payer: Medicare Other

## 2012-06-26 MED ORDER — IOHEXOL 300 MG/ML  SOLN
100.0000 mL | Freq: Once | INTRAMUSCULAR | Status: AC | PRN
  Administered 2012-06-26: 100 mL via INTRAVENOUS

## 2012-06-26 MED ORDER — OXYCODONE-ACETAMINOPHEN 5-325 MG PO TABS: 1.0000 | ORAL_TABLET | Freq: Four times a day (QID) | ORAL | Status: DC | PRN

## 2012-06-26 NOTE — ED Notes (Signed)
Pt denies any questions upon discharge. 

## 2012-06-27 ENCOUNTER — Encounter (HOSPITAL_COMMUNITY): Payer: Self-pay | Admitting: Vascular Surgery

## 2012-06-27 ENCOUNTER — Emergency Department (HOSPITAL_COMMUNITY)
Admission: EM | Admit: 2012-06-27 | Discharge: 2012-06-27 | Disposition: A | Discharge status: Home / Self Care | Payer: Medicare Other | Attending: Emergency Medicine | Admitting: Emergency Medicine

## 2012-06-27 ENCOUNTER — Emergency Department (HOSPITAL_COMMUNITY): Payer: Medicare Other

## 2012-06-27 DIAGNOSIS — Z887 Allergy status to serum and vaccine status: Secondary | ICD-10-CM | POA: Insufficient documentation

## 2012-06-27 DIAGNOSIS — I509 Heart failure, unspecified: Secondary | ICD-10-CM

## 2012-06-27 DIAGNOSIS — E669 Obesity, unspecified: Secondary | ICD-10-CM | POA: Insufficient documentation

## 2012-06-27 DIAGNOSIS — Z880 Allergy status to penicillin: Secondary | ICD-10-CM | POA: Insufficient documentation

## 2012-06-27 DIAGNOSIS — I10 Essential (primary) hypertension: Secondary | ICD-10-CM | POA: Insufficient documentation

## 2012-06-27 DIAGNOSIS — Z8679 Personal history of other diseases of the circulatory system: Secondary | ICD-10-CM | POA: Insufficient documentation

## 2012-06-27 DIAGNOSIS — Z7901 Long term (current) use of anticoagulants: Secondary | ICD-10-CM | POA: Insufficient documentation

## 2012-06-27 DIAGNOSIS — Z8719 Personal history of other diseases of the digestive system: Secondary | ICD-10-CM | POA: Insufficient documentation

## 2012-06-27 DIAGNOSIS — IMO0001 Reserved for inherently not codable concepts without codable children: Secondary | ICD-10-CM | POA: Insufficient documentation

## 2012-06-27 DIAGNOSIS — M791 Myalgia, unspecified site: Secondary | ICD-10-CM

## 2012-06-27 DIAGNOSIS — J189 Pneumonia, unspecified organism: Secondary | ICD-10-CM

## 2012-06-27 DIAGNOSIS — J159 Unspecified bacterial pneumonia: Secondary | ICD-10-CM | POA: Insufficient documentation

## 2012-06-27 DIAGNOSIS — I5042 Chronic combined systolic (congestive) and diastolic (congestive) heart failure: Secondary | ICD-10-CM | POA: Insufficient documentation

## 2012-06-27 LAB — POCT I-STAT TROPONIN I: Troponin i, poc: 0 ng/mL (ref 0.00–0.08)

## 2012-06-27 LAB — PRO B NATRIURETIC PEPTIDE: Pro B Natriuretic peptide (BNP): 4914 pg/mL — ABNORMAL HIGH (ref 0–450)

## 2012-06-27 LAB — D-DIMER, QUANTITATIVE: D-Dimer, Quant: >20 ug/mL-FEU — ABNORMAL HIGH (ref 0.00–0.48)

## 2012-06-27 MED ORDER — LEVOFLOXACIN 500 MG PO TABS
500.0000 mg | ORAL_TABLET | Freq: Once | ORAL | Status: AC
  Administered 2012-06-27: 500 mg via ORAL
  Filled 2012-06-27: qty 1

## 2012-06-27 MED ORDER — LEVOFLOXACIN 500 MG PO TABS: 500.0000 mg | ORAL_TABLET | Freq: Every day | ORAL | Status: DC

## 2012-06-27 MED ORDER — FUROSEMIDE 40 MG PO TABS: 40.0000 mg | ORAL_TABLET | Freq: Two times a day (BID) | ORAL | Status: DC

## 2012-06-27 MED ORDER — IOHEXOL 350 MG/ML SOLN
100.0000 mL | Freq: Once | INTRAVENOUS | Status: AC | PRN
  Administered 2012-06-27: 100 mL via INTRAVENOUS

## 2012-06-27 MED ORDER — CYCLOBENZAPRINE HCL 10 MG PO TABS: 10.0000 mg | ORAL_TABLET | Freq: Three times a day (TID) | ORAL | Status: DC | PRN

## 2012-06-27 MED ORDER — CYCLOBENZAPRINE HCL 10 MG PO TABS
10.0000 mg | ORAL_TABLET | Freq: Once | ORAL | Status: AC
  Administered 2012-06-27: 10 mg via ORAL
  Filled 2012-06-27: qty 1

## 2012-06-27 NOTE — Discharge Instructions (Signed)
Get the percocet you were prescribed on the 18th filled for pain. Take it with the flexeril for your sore muscles. Take the antibiotic until gone. Keep your appointment with Dr Valentina Lucks tomorrow in her office.

## 2012-06-27 NOTE — ED Notes (Signed)
Dr. Lynelle Doctor attempted ultrasound IV, unsuccessful.

## 2012-06-27 NOTE — ED Notes (Signed)
CT notified of new IV 

## 2012-06-27 NOTE — ED Notes (Signed)
Attempted an IV on patient without success.

## 2012-06-27 NOTE — ED Notes (Addendum)
Pt reports to the ED for generalized pain x several weeks. She was seen here on Sunday and d/c with Oxycodone but she has not gotten it filled. Pt also reports she received some IV fluid on Sunday and that she is now reporting generalized body swelling. Pt also reports a productive clear cough. Pt denies N/V/D but reports decreased appetite. Pt has hx of CHF but does not weigh herself regularly. Pt also report some intermittent sharp substernal CP. Pt denies any associated symptoms at this time. Pt A&O x4.

## 2012-06-27 NOTE — ED Notes (Addendum)
2 RNs attempted to start IV without success. EDP made aware. IV team paged.

## 2012-06-27 NOTE — ED Provider Notes (Signed)
History     CSN: [id number]  Arrival date & time 06/27/12  1104   First MD Initiated Contact with Patient 06/27/12 1111      Chief Complaint  Patient presents with  . Pain    (Consider location/radiation/quality/duration/timing/severity/associated sxs/prior treatment) HPI Patient was seen in the ED 2 days ago for similar complaints. She reports she has been swelling over the past week. She states she has mild shortness of breath that was mainly last night. She has a mild cough without fever. She states she's been having abdominal swelling and swelling in her legs and her feet. Her daughter states she is supposed to weigh herself daily and if she gains more than 3 pounds she is supposed to be evaluated. However patient refuses to weigh her self. Pt also cannot tell me what her base line weight is supposed to be. She does have a history of congestive heart failure and is followed by [facility] cardiology. She also reports she's had some chest pain off and on for the past few days indicates her left chest area. She stated as hard to characterize her pain but states it is sharp and it lasted a few minutes. Patient presents today also complaining of diffuse body pain. When she was seen in the ED 2 days ago she was given a prescription for Percocet but she has not filled. She states she did take her pain pill this morning, Norco. Patient presents via EMS.  PCP Dr Massie Maroon has an appointment tomorrow, did have an appointment today but she changed it. Cardiology Dr Antoine Poche  Past Medical History  Diagnosis Date  . HTN (hypertension)   . Diverticulosis   . Hemorrhoid   . Osteoarthritis   . Constipation, chronic   . H/O: hysterectomy   . Systolic and diastolic CHF, chronic     a. Echo 07/2011: Technically limited; inferior HK, inferolateral and possibly lateral wall HK, EF 40%, moderate LVE, aortic sclerosis without stenosis, mild MR, mild LAE, question RV dysfunction  . Female bladder prolapse     with prolapse uterus, cystocele, s/p TVH/BSO 12/2006  . Obesity (BMI 30-39.9)     wt. 94.1 kg 12/2010  . Moderate mitral regurgitation     a. Mod to Sev by echo 01/2009;  b. mild MR by echo 2013  . Atrial fibrillation 10/13/2011    coumadin Rx  . Iron deficiency anemia     Past Surgical History  Procedure Laterality Date  . Wrist surgery    . Vaginal hysterectomy,cystocele and prolapse  repair  NOV. 2008  . Abdominal hysterectomy      Family History  Problem Relation Age of Onset  . Diabetes Brother   . Hypertension Mother     deceased @ 81  . Leukemia Brother   . Cancer Brother   . Stroke Mother   . Diabetes Father     deceased in his 23's    History  Substance Use Topics  . Smoking status: Never Smoker   . Smokeless tobacco: Current User    Types: Snuff     Comment: Uses Snuff regularly.  Dips several x/day.  A "large can" can last her up to 3 mos.  . Alcohol Use: No  lives at home Lives alone  OB History   Grav Para Term Preterm Abortions TAB SAB Ect Mult Living   6               Review of Systems  All other systems reviewed and  are negative.    Allergies  Penicillins; Tetanus-diphtheria toxoids td; and Latex  Home Medications   Current Outpatient Rx  Name  Route  Sig  Dispense  Refill  . bisacodyl (DULCOLAX) 5 MG EC tablet   Oral   Take 5 mg by mouth daily. For         . carvedilol (COREG) 6.25 MG tablet   Oral   Take 6.25 mg by mouth 2 (two) times daily with a meal.         . ferrous fumarate (FERRETTS) 325 (106 FE) MG TABS   Oral   Take 1 tablet by mouth 2 (two) times daily.          . furosemide (LASIX) 40 MG tablet   Oral   Take 60 mg by mouth 2 (two) times daily.         Marland Kitchen HYDROcodone-acetaminophen (VICODIN) 5-500 MG per tablet   Oral   Take 1 tablet by mouth every 8 (eight) hours as needed for pain.          Marland Kitchen lisinopril (PRINIVIL,ZESTRIL) 10 MG tablet   Oral   Take 10 mg by mouth daily.         . metolazone  (ZAROXOLYN) 5 MG tablet   Oral   Take 5 mg by mouth as needed. Take with morning dose of lasix         . potassium chloride SA (K-DUR,KLOR-CON) 20 MEQ tablet   Oral   Take 40 mEq by mouth 3 (three) times daily.         . traMADol (ULTRAM) 50 MG tablet   Oral   Take 50 mg by mouth every 8 (eight) hours as needed for pain. For pain         . warfarin (COUMADIN) 2.5 MG tablet   Oral   Take 2.5-5 mg by mouth See admin instructions. Wynelle Link., Tues., and Thurs. 5 mg.   Mon. Wed., Fri., Sat.  3.7 mg         . oxyCODONE-acetaminophen (PERCOCET) 5-325 MG per tablet   Oral   Take 1 tablet by mouth every 6 (six) hours as needed for pain.   10 tablet HAS NOT FILLED  0     BP 129/65  Pulse 71  Temp(Src) 98.1 F (36.7 C) (Oral)  Resp 23  Wt 245 lb 6 oz (111.3 kg)  BMI 46.39 kg/m2  SpO2 98%  Vital signs normal     Physical Exam  Nursing note and vitals reviewed. Constitutional: She is oriented to person, place, and time. She appears well-developed and well-nourished.  Non-toxic appearance. She does not appear ill. No distress.  Obese   HENT:  Head: Normocephalic and atraumatic.  Right Ear: External ear normal.  Left Ear: External ear normal.  Nose: Nose normal. No mucosal edema or rhinorrhea.  Mouth/Throat: Oropharynx is clear and moist and mucous membranes are normal. No dental abscesses or edematous.  Eyes: Conjunctivae and EOM are normal. Pupils are equal, round, and reactive to light.  Neck: Normal range of motion and full passive range of motion without pain. Neck supple.  Cardiovascular: Normal rate, regular rhythm and normal heart sounds.  Exam reveals no gallop and no friction rub.   No murmur heard. Pulmonary/Chest: Effort normal and breath sounds normal. No respiratory distress. She has no wheezes. She has no rhonchi. She has no rales. She exhibits no tenderness and no crepitus.    Diminished breath sounds, few crackles at bases, area of  chest pain noted    Abdominal: Soft. Normal appearance and bowel sounds are normal. She exhibits no distension. There is no tenderness. There is no rebound and no guarding.  Musculoskeletal: Normal range of motion. She exhibits no edema and no tenderness.  Pt does not have pitting edema, legs appear to have a lot of adipose tissue  Neurological: She is alert and oriented to person, place, and time. She has normal strength. No cranial nerve deficit.  Skin: Skin is warm, dry and intact. No rash noted. No erythema. No pallor.  Psychiatric: She has a normal mood and affect. Her speech is normal and behavior is normal. Her mood appears not anxious.    ED Course  Procedures (including critical care time)  Medications  levofloxacin (LEVAQUIN) tablet 500 mg (not administered)  cyclobenzaprine (FLEXERIL) tablet 10 mg (10 mg Oral Given 06/27/12 1223)  iohexol (OMNIPAQUE) 350 MG/ML injection 100 mL (100 mLs Intravenous Contrast Given 06/27/12 1503)   Pt has been in NAD while in the ED, she has no respiratory distress. Pulse ox has been good at 98-100 %. PT has diffuse body pain to light touch.   Results for orders placed during the hospital encounter of 06/27/12  PRO B NATRIURETIC PEPTIDE      Result Value Range   Pro B Natriuretic peptide (BNP) 4914.0 (*) 0 - 450 pg/mL  D-DIMER, QUANTITATIVE      Result Value Range   D-Dimer, Quant >20.00 (*) 0.00 - 0.48 ug/mL-FEU  POCT I-STAT TROPONIN I      Result Value Range   Troponin i, poc 0.00  0.00 - 0.08 ng/mL   Comment 3            Laboratory interpretation all normal except elevated BNP and a very elevated d-dimer  Patient had urinalysis, CBC, CMET done 5 /18   Dg Chest 2 View  06/25/2012   .  IMPRESSION: Stable mild cardiomegaly.  No active lung disease.   Original Report Authenticated By: Myles Rosenthal, M.D.   Ct Abdomen Pelvis W Contrast  06/26/2012     IMPRESSION: Stable exam.  No acute findings.   Original Report Authenticated By: Myles Rosenthal, M.D.    Dg  Chest 2 View  06/25/2012   *RADIOLOGY REPORT*  Clinical Data: Chest pain.  Hypertension.  Congestive heart failure.  Atrial fibrillation.  CHEST - 2 VIEW  Comparison: 10/14/2011  Findings: Mild cardiomegaly and tortuosity of thoracic aorta are stable.  Both lungs are clear.  No evidence of pleural effusion. No mass or lymphadenopathy identified.  IMPRESSION: Stable mild cardiomegaly.  No active lung disease.   Original Report Authenticated By: Myles Rosenthal, M.D.   Ct Angio Chest W/cm &/or Wo Cm  06/27/2012   *RADIOLOGY REPORT*  Clinical Data: Shortness of breath, elevated D-dimer  CT ANGIOGRAPHY CHEST  Technique:  Multidetector CT imaging of the chest using the standard protocol during bolus administration of intravenous contrast. Multiplanar reconstructed images including MIPs were obtained and reviewed to evaluate the vascular anatomy.  Contrast: OMNIPAQUE IOHEXOL 350 MG/ML SOLN  Comparison: Chest radiograph 06/25/2012, chest CT 01/04/2011  Findings: The study is of adequate technical quality for evaluation for pulmonary embolism up to and including the 3rd order pulmonary arteries. No focal filling defect is seen to suggest acute pulmonary embolism.  Heart size mildly enlarged.  No pericardial effusion.  No pleural effusion.  Pulmonary parenchymal scarring abutting the descending aorta is stable.  No lymphadenopathy.  Examination is degraded by patient motion.  There is focal bronchial wall thickening and patchy parenchymal consolidation in the right upper lobe, for example image 23.  There is also patchy superior segment right lower lobe airspace consolidation. Curvilinear lingular scarring or atelectasis again noted.  No acute osseous finding.  IMPRESSION: Patchy right upper lobe and superior segment right lower lobe airspace opacity which could represent early bronchopneumonia given the appearance and the presence of shortness of breath.  No CT evidence for acute pulmonary embolism.   Original Report  Authenticated By: Christiana Pellant, M.D.   Ct Abdomen Pelvis W Contrast  06/26/2012   *RADIOLOGY REPORT*  Clinical Data: Diffuse abdominal pain.  CT ABDOMEN AND PELVIS WITH CONTRAST  Technique:  Multidetector CT imaging of the abdomen and pelvis was performed following the standard protocol during bolus administration of intravenous contrast.  Contrast: OMNIPAQUE IOHEXOL 300 MG/ML  SOLN  Comparison: 12/27/2010  Findings: The liver, gallbladder, pancreas, spleen, and adrenal glands are normal in appearance.  Several renal cysts are again seen bilaterally however there is no evidence of renal mass or hydronephrosis.  No soft tissue masses or lymphadenopathy identified within the abdomen or pelvis.  Prior hysterectomy noted.  Adnexal regions are unremarkable.  No evidence of inflammatory process or abnormal fluid collections.  No evidence of bowel wall thickening, dilatation, or hernia.  IMPRESSION: Stable exam.  No acute findings.   Original Report Authenticated By: Myles Rosenthal, M.D.    Dg Chest 2 View  06/25/2012     IMPRESSION: Stable mild cardiomegaly.  No active lung disease.   Original Report Authenticated By: Myles Rosenthal, M.D.   Ct Abdomen Pelvis W Contrast  06/26/2012  IMPRESSION: Stable exam.  No acute findings.   Original Report Authenticated By: Myles Rosenthal, M.D.     Date: 06/27/2012  Rate: 68  Rhythm: normal sinus rhythm  QRS Axis: left  Intervals: normal  ST/T Wave abnormalities: normal  Conduction Disutrbances:left anterior fascicular block  Narrative Interpretation: q waves anter/septal leads  Old EKG Reviewed: unchanged from 10/20/2011    1. Myalgia   2. Community acquired pneumonia   3. CHF (congestive heart failure)       New Prescriptions   CYCLOBENZAPRINE (FLEXERIL) 10 MG TABLET    Take 1 tablet (10 mg total) by mouth 3 (three) times daily as needed for muscle spasms.   FUROSEMIDE (LASIX) 40 MG TABLET    Take 1 tablet (40 mg total) by mouth 2 (two) times daily.    LEVOFLOXACIN (LEVAQUIN) 500 MG TABLET    Take 1 tablet (500 mg total) by mouth daily.    Plan discharge  Devoria Albe, MD, FACEP    MDM          Ward Givens, MD 06/27/12 [phone]

## 2012-06-27 NOTE — ED Notes (Signed)
CT called and informed pt has IV access.  

## 2012-06-27 NOTE — ED Notes (Signed)
CT tech brought pt back, reports IV isn't working properly, IV will flush but meeting resistance with the contrast. Dr. Lynelle Doctor made aware. Resident asked to come try to get an ultrasound IV, reports he will be to attempt as soon as possible.

## 2012-06-28 ENCOUNTER — Telehealth: Payer: Self-pay | Admitting: Cardiology

## 2012-06-28 NOTE — Telephone Encounter (Signed)
Follow Up      Pt calling in following up on phone call from earlier. Please call back,

## 2012-06-28 NOTE — Telephone Encounter (Signed)
Returned call to patient's daughter [name] no answer.LMTC

## 2012-06-28 NOTE — Telephone Encounter (Signed)
Left another message for pt to call back.

## 2012-06-28 NOTE — Telephone Encounter (Signed)
New problem   Per pts daughter she has pneumonia(per [place]) & she's not getting any better

## 2012-06-28 NOTE — ED Provider Notes (Signed)
I saw and evaluated the patient, reviewed the resident's note and I agree with the findings and plan.  Pt with diffuse body pain.  When further questioned, pt's primary area of pain seems to be abdomen.  PT reports to me that she is concerned that something is "falling out" from her pelvic region.  She is mildly tender in genralized upper abdomen.  No N/V, no fever, no elevated WBC.  Given her age, will obtain CT of abd/pelvis to assess for mild pain, tenderness and also to assess for any kind of subtle prolapse that is not obvious on exam.  CT pending, signed out to oncoming attending physician.   Impression: Myalgias Abdominal Pain   Gavin Pound. Calyb Mcquarrie, MD 06/28/12 1445

## 2012-06-29 NOTE — Telephone Encounter (Signed)
New Prob       C/o SOB, having issues swallowing, can barely walk, eyes closed shut. Daughter is concerned and would like to speak to nurse.

## 2012-06-29 NOTE — Telephone Encounter (Signed)
Left message to call back to discuss care or for pt to go to ED for eval d/t s/s.

## 2012-06-30 NOTE — Telephone Encounter (Signed)
Finally was able to contact daughter who states she has not seen her mother yet today but will be going over there.  States pt's wt was 258 lbs yesterday and she has been having swelling, SOB and fatigue.  Pt's last wt here was 253 lbs.  She was instructed at last appointment to take 60 mg of Lasix BID and metolazone if wt went up more than 3 lbs over night (along with extra potassium).  Per daughter's report the pt has only been taking 80 mg (40 am/40 pm) of Lasix daily and has not taken any metolazone "in a long time"  Instructed daughter to have pt take 60 mg BID of Lasix as ordered and take Metolazone/potassium today as well.  She is to weigh tomorrow and if wt is not down by 3 lbs she will take another metolazone/potassium.  If SOB and edema remain after 2 doses she is to call MD on call this week.  She is to follow up as scheduled otherwise.

## 2012-07-05 ENCOUNTER — Telehealth: Payer: Self-pay | Admitting: Cardiology

## 2012-07-05 NOTE — Telephone Encounter (Signed)
Spoke with patient's daughter who states her mother is experiencing severe leg weakness and heaviness today.  She states that the patient was unable to get up off the toilet and had to call her to come and help her which is abnormal for the patient.  She c/o of being very weak and fatigued. Her weight today is 246 lb.  She weighed 245 lb yesterday.  The patient has increased her Lasix to 60 mg BID and took Metolazone with extra K+ Friday and Saturday per Dr. Jenene Slicker instructions last week when they called to report that patient's weight ws up to 258 lb.  I verified with patient's daughter that this information suggests patient has lost 12 lbs since last week and she agreed but said patient has increased weakness and feels like her legs are "full of fluid and too heavy to pick up."  Patient's BP is 95/60, HR 79.  She is recovering from an allergic reaction to something that she is taking - possibly antibiotics that she was given for pneumonia.  She has a hx of allergy to PCN.  Patient's daughter has been treating the reaction with Benadryl.  Patient c/o mild SOB and chest pain.  I advised patient's daughter that I will send message to Dr. Antoine Poche  who is working in the [place] office for advice.  Patient's daughter verbalized understanding.

## 2012-07-05 NOTE — Telephone Encounter (Signed)
New problem    Per pts daughter pt is not doing better/hands and feet are very swollen and she weighs 246 lbs and b/p is 99/66

## 2012-07-05 NOTE — Telephone Encounter (Signed)
Pt added on to Dr Hochrein's schedule for tomorrow at 1:30

## 2012-07-06 ENCOUNTER — Ambulatory Visit (INDEPENDENT_AMBULATORY_CARE_PROVIDER_SITE_OTHER): Payer: Medicare Other | Admitting: Cardiology

## 2012-07-06 ENCOUNTER — Encounter (INDEPENDENT_AMBULATORY_CARE_PROVIDER_SITE_OTHER): Payer: Medicare Other

## 2012-07-06 ENCOUNTER — Encounter: Payer: Self-pay | Admitting: Cardiology

## 2012-07-06 ENCOUNTER — Telehealth: Payer: Self-pay | Admitting: *Deleted

## 2012-07-06 VITALS — BP 90/70 | HR 80 | Ht 60.0 in | Wt 248.1 lb

## 2012-07-06 DIAGNOSIS — I428 Other cardiomyopathies: Secondary | ICD-10-CM

## 2012-07-06 DIAGNOSIS — I509 Heart failure, unspecified: Secondary | ICD-10-CM

## 2012-07-06 DIAGNOSIS — I5043 Acute on chronic combined systolic (congestive) and diastolic (congestive) heart failure: Secondary | ICD-10-CM

## 2012-07-06 DIAGNOSIS — I4891 Unspecified atrial fibrillation: Secondary | ICD-10-CM

## 2012-07-06 DIAGNOSIS — Z7901 Long term (current) use of anticoagulants: Secondary | ICD-10-CM

## 2012-07-06 LAB — BASIC METABOLIC PANEL
BUN: 30 mg/dL — ABNORMAL HIGH (ref 6–23)
Chloride: 93 mEq/L — ABNORMAL LOW (ref 96–112)
Creatinine, Ser: 2 mg/dL — ABNORMAL HIGH (ref 0.4–1.2)
Glucose, Bld: 87 mg/dL (ref 70–99)

## 2012-07-06 MED ORDER — FUROSEMIDE 80 MG PO TABS: 80.0000 mg | ORAL_TABLET | Freq: Two times a day (BID) | ORAL | Status: DC

## 2012-07-06 MED ORDER — LISINOPRIL 5 MG PO TABS: 5.0000 mg | ORAL_TABLET | Freq: Every day | ORAL | Status: DC

## 2012-07-06 NOTE — Telephone Encounter (Signed)
Patient saw md today but did not come over for her coumadin appt, I called her and rescheduled appt, she states she was so tired after visit she just left.

## 2012-07-06 NOTE — Patient Instructions (Addendum)
Please decrease your Lisinopril to 5 mg a day, Increase Furosemide to 80 mg twice a day, Take Metolazone for 2 days, Take one extra potassium for the next 2 days. Continue all other medications as listed.  Please have blood work today (BMP, BNP) and blood work in 1 week (BMP)  Follow up in 6 weeks with Tereso Newcomer, PA

## 2012-07-06 NOTE — Progress Notes (Signed)
HPI The patient presents for followup of her mixed heart failure.  Since I last saw her she was in the emergency room. It sounds like she presented with myalgias. However, she said she's having increasing edema. He feels fatigued. She said she doesn't have an appetite. She did call us about a week ago with increased weights and took an extra 20 twice a day of Lasix for 3 days and Zaroxolyn for 3 days with increased potassium. Her weights are back down but she does not feel well. She's having increasing shortness of breath and feels like she has more fluid in her legs and diffusely she feels like she is more swollen. She has more weakness. She's almost total assist her daughter. In the emergency room she was treated with Levaquin and she has subsequently developed a rash. She's done without medication. She's been taking Benadryl.  Allergies  Allergen Reactions  . Penicillins Other (See Comments)    unknown  . Tetanus-Diphtheria Toxoids Td Other (See Comments)    unknown  . Latex Hives and Rash    Current Outpatient Prescriptions  Medication Sig Dispense Refill  . bisacodyl (DULCOLAX) 5 MG EC tablet Take 5 mg by mouth daily. For      . carvedilol (COREG) 6.25 MG tablet Take 6.25 mg by mouth 2 (two) times daily with a meal.      . cyclobenzaprine (FLEXERIL) 10 MG tablet Take 1 tablet (10 mg total) by mouth 3 (three) times daily as needed for muscle spasms.  30 tablet  0  . ferrous fumarate (FERRETTS) 325 (106 FE) MG TABS Take 1 tablet by mouth 2 (two) times daily.       . furosemide (LASIX) 40 MG tablet Take 60 mg by mouth 2 (two) times daily.      Marland Kitchen HYDROcodone-acetaminophen (VICODIN) 5-500 MG per tablet Take 1 tablet by mouth every 8 (eight) hours as needed for pain.       Marland Kitchen levofloxacin (LEVAQUIN) 500 MG tablet Take 1 tablet (500 mg total) by mouth daily.  7 tablet  0  . lisinopril (PRINIVIL,ZESTRIL) 10 MG tablet Take 10 mg by mouth daily.      . metolazone (ZAROXOLYN) 5 MG tablet Take 5  mg by mouth as needed. Take with morning dose of lasix      . potassium chloride SA (K-DUR,KLOR-CON) 20 MEQ tablet Take 40 mEq by mouth 3 (three) times daily.      . traMADol (ULTRAM) 50 MG tablet Take 50 mg by mouth every 8 (eight) hours as needed for pain. For pain      . warfarin (COUMADIN) 2.5 MG tablet Take 2.5-5 mg by mouth See admin instructions. Wynelle Link., Tues., and Thurs. 5 mg.   Mon. Wed., Fri., Sat.  3.7 mg       No current facility-administered medications for this visit.    Past Medical History  Diagnosis Date  . HTN (hypertension)   . Diverticulosis   . Hemorrhoid   . Osteoarthritis   . Constipation, chronic   . H/O: hysterectomy   . Systolic and diastolic CHF, chronic     a. Echo 07/2011: Technically limited; inferior HK, inferolateral and possibly lateral wall HK, EF 40%, moderate LVE, aortic sclerosis without stenosis, mild MR, mild LAE, question RV dysfunction  . Female bladder prolapse     with prolapse uterus, cystocele, s/p TVH/BSO 12/2006  . Obesity (BMI 30-39.9)     wt. 94.1 kg 12/2010  . Moderate mitral regurgitation  a. Mod to Sev by echo 01/2009;  b. mild MR by echo 2013  . Atrial fibrillation 10/13/2011    coumadin Rx  . Iron deficiency anemia     Past Surgical History  Procedure Laterality Date  . Wrist surgery    . Vaginal hysterectomy,cystocele and prolapse  repair  NOV. 2008  . Abdominal hysterectomy      ROS:  Diffuse aches and pains.  Otherwise as stated in the HPI and negative for all other systems.  PHYSICAL EXAM BP 90/70  Pulse 80  Ht 5' (1.524 m)  Wt 248 lb 1.9 oz (112.546 kg)  BMI 48.46 kg/m2 PHYSICAL EXAM GEN:  No distress NECK:  No jugular venous distention at 90 degrees, waveform within normal limits, carotid upstroke brisk and symmetric, no bruits, no thyromegaly, conjunctiva injected LYMPHATICS:  No cervical adenopathy LUNGS:  Clear to auscultation bilaterally BACK:  No CVA tenderness CHEST:  Unremarkable HEART:  S1 and S2  within normal limits, no S3, no S4, no clicks, no rubs, no murmurs ABD:  Positive bowel sounds normal in frequency in pitch, no bruits, no rebound, no guarding, unable to assess midline mass or bruit with the patient seated,morbidly obese EXT:  2 plus pulses throughout, mild edema, no cyanosis no clubbing   ASSESSMENT AND PLAN  Acute on Chronic Combined Systolic and Diastolic CHF:  It is very difficult to assess her volume. Her weight is actually down from previous. However, she feels more swollen and more short of breath. I'm going to increase her Lasix to 80 mg twice a day and for today she will take Zaroxolyn. She will take her extra potassium. I will get a basic metabolic profile and a BNP today. I will repeat a basic metabolic profile again in a few more days.  Atrial Fibrillation:  She has had no symptomatic atrial fibrillation. She tolerates anticoagulation and she will continue with his therapy.  Hypertension: As her blood pressure is running low we will reduce her lisinopril 5 mg daily.  Rash: This was probably related to Levaquin and this has been added to her allergy list. She will continue with Benadryl as needed.

## 2012-07-07 ENCOUNTER — Other Ambulatory Visit: Payer: Self-pay | Admitting: *Deleted

## 2012-07-07 DIAGNOSIS — I5022 Chronic systolic (congestive) heart failure: Secondary | ICD-10-CM

## 2012-07-13 ENCOUNTER — Ambulatory Visit (INDEPENDENT_AMBULATORY_CARE_PROVIDER_SITE_OTHER): Payer: Medicare Other | Admitting: *Deleted

## 2012-07-13 DIAGNOSIS — I509 Heart failure, unspecified: Secondary | ICD-10-CM

## 2012-07-13 DIAGNOSIS — Z7901 Long term (current) use of anticoagulants: Secondary | ICD-10-CM

## 2012-07-13 DIAGNOSIS — R0602 Shortness of breath: Secondary | ICD-10-CM

## 2012-07-13 DIAGNOSIS — I4891 Unspecified atrial fibrillation: Secondary | ICD-10-CM

## 2012-07-13 DIAGNOSIS — I5022 Chronic systolic (congestive) heart failure: Secondary | ICD-10-CM

## 2012-07-13 LAB — BASIC METABOLIC PANEL
BUN: 37 mg/dL — ABNORMAL HIGH (ref 6–23)
CO2: 27 mEq/L (ref 19–32)
Calcium: 8.8 mg/dL (ref 8.4–10.5)
Creatinine, Ser: 1.8 mg/dL — ABNORMAL HIGH (ref 0.4–1.2)
Glucose, Bld: 95 mg/dL (ref 70–99)

## 2012-07-13 LAB — POCT INR: INR: 3.4

## 2012-07-24 ENCOUNTER — Other Ambulatory Visit: Payer: Self-pay | Admitting: *Deleted

## 2012-07-24 MED ORDER — CARVEDILOL 6.25 MG PO TABS: 6.2500 mg | ORAL_TABLET | Freq: Two times a day (BID) | ORAL | Status: DC

## 2012-07-31 ENCOUNTER — Ambulatory Visit (INDEPENDENT_AMBULATORY_CARE_PROVIDER_SITE_OTHER): Payer: Medicare Other | Admitting: Pharmacist

## 2012-07-31 ENCOUNTER — Ambulatory Visit (INDEPENDENT_AMBULATORY_CARE_PROVIDER_SITE_OTHER): Payer: Medicare Other | Admitting: Cardiology

## 2012-07-31 ENCOUNTER — Encounter: Payer: Self-pay | Admitting: Cardiology

## 2012-07-31 VITALS — BP 95/75 | HR 67 | Ht 61.0 in | Wt 243.4 lb

## 2012-07-31 DIAGNOSIS — I5042 Chronic combined systolic (congestive) and diastolic (congestive) heart failure: Secondary | ICD-10-CM

## 2012-07-31 DIAGNOSIS — R5383 Other fatigue: Secondary | ICD-10-CM

## 2012-07-31 DIAGNOSIS — Z7901 Long term (current) use of anticoagulants: Secondary | ICD-10-CM

## 2012-07-31 DIAGNOSIS — I509 Heart failure, unspecified: Secondary | ICD-10-CM

## 2012-07-31 DIAGNOSIS — I5043 Acute on chronic combined systolic (congestive) and diastolic (congestive) heart failure: Secondary | ICD-10-CM

## 2012-07-31 DIAGNOSIS — I4891 Unspecified atrial fibrillation: Secondary | ICD-10-CM

## 2012-07-31 LAB — CBC
HCT: 33.3 % — ABNORMAL LOW (ref 36.0–46.0)
Hemoglobin: 11.1 g/dL — ABNORMAL LOW (ref 12.0–15.0)
Platelets: 424 10*3/uL — ABNORMAL HIGH (ref 150.0–400.0)
RBC: 3.58 Mil/uL — ABNORMAL LOW (ref 3.87–5.11)
WBC: 5.6 10*3/uL (ref 4.5–10.5)

## 2012-07-31 LAB — BASIC METABOLIC PANEL
CO2: 24 mEq/L (ref 19–32)
Chloride: 103 mEq/L (ref 96–112)
Potassium: 4.4 mEq/L (ref 3.5–5.1)
Sodium: 134 mEq/L — ABNORMAL LOW (ref 135–145)

## 2012-07-31 LAB — POCT INR: INR: 1.8

## 2012-07-31 NOTE — Patient Instructions (Addendum)
The current medical regimen is effective;  continue present plan and medications.  Please have blood work today  (BMP, CBC)  Follow up in 4 months with Tereso Newcomer, PA.  You will receive a letter in the mail 2 months before you are due.  Please call us when you receive this letter to schedule your follow up appointment.

## 2012-07-31 NOTE — Progress Notes (Signed)
HPI The patient presents for followup of her mixed heart failure.  Since I last saw her she has had no acute illnesses. Her sodium has been running low and we've been watching his with frequent blood work. However, she seems to be keeping her volume down. Her weight is down about 5 pounds. She denies any chest pressure, neck or arm discomfort.  She has had no palpitations, presyncope or syncope. He denies any PND or orthopnea.  Allergies  Allergen Reactions  . Penicillins Other (See Comments)    unknown  . Tetanus-Diphtheria Toxoids Td Other (See Comments)    unknown  . Latex Hives and Rash  . Levaquin (Levofloxacin In D5w) Rash    Current Outpatient Prescriptions  Medication Sig Dispense Refill  . bisacodyl (DULCOLAX) 5 MG EC tablet Take 5 mg by mouth daily. For      . carvedilol (COREG) 6.25 MG tablet Take 1 tablet (6.25 mg total) by mouth 2 (two) times daily with a meal.  60 tablet  5  . cyclobenzaprine (FLEXERIL) 10 MG tablet Take 1 tablet (10 mg total) by mouth 3 (three) times daily as needed for muscle spasms.  30 tablet  0  . ferrous fumarate (FERRETTS) 325 (106 FE) MG TABS Take 1 tablet by mouth 2 (two) times daily.       . furosemide (LASIX) 80 MG tablet Take 1 tablet (80 mg total) by mouth 2 (two) times daily.  60 tablet  6  . HYDROcodone-acetaminophen (VICODIN) 5-500 MG per tablet Take 1 tablet by mouth every 8 (eight) hours as needed for pain.       Marland Kitchen lisinopril (PRINIVIL,ZESTRIL) 5 MG tablet Take 1 tablet (5 mg total) by mouth daily.  30 tablet  11  . metolazone (ZAROXOLYN) 5 MG tablet Take 5 mg by mouth as needed. Take with morning dose of lasix      . potassium chloride SA (K-DUR,KLOR-CON) 20 MEQ tablet Take 40 mEq by mouth 3 (three) times daily.      . traMADol (ULTRAM) 50 MG tablet Take 50 mg by mouth every 8 (eight) hours as needed for pain. For pain      . warfarin (COUMADIN) 2.5 MG tablet Take 2.5-5 mg by mouth See admin instructions. Wynelle Link., Tues., and Thurs. 5 mg.    Mon. Wed., Fri., Sat.  3.7 mg       No current facility-administered medications for this visit.    Past Medical History  Diagnosis Date  . HTN (hypertension)   . Diverticulosis   . Hemorrhoid   . Osteoarthritis   . Constipation, chronic   . H/O: hysterectomy   . Systolic and diastolic CHF, chronic     a. Echo 07/2011: Technically limited; inferior HK, inferolateral and possibly lateral wall HK, EF 40%, moderate LVE, aortic sclerosis without stenosis, mild MR, mild LAE, question RV dysfunction  . Female bladder prolapse     with prolapse uterus, cystocele, s/p TVH/BSO 12/2006  . Obesity (BMI 30-39.9)     wt. 94.1 kg 12/2010  . Moderate mitral regurgitation     a. Mod to Sev by echo 01/2009;  b. mild MR by echo 2013  . Atrial fibrillation 10/13/2011    coumadin Rx  . Iron deficiency anemia     Past Surgical History  Procedure Laterality Date  . Wrist surgery    . Vaginal hysterectomy,cystocele and prolapse  repair  NOV. 2008  . Abdominal hysterectomy      ROS:  Diffuse aches  and pains.  Otherwise as stated in the HPI and negative for all other systems.  PHYSICAL EXAM BP 95/75  Pulse 67  Ht 5\' 1"  (1.549 m)  Wt 243 lb 6.4 oz (110.406 kg)  BMI 46.01 kg/m2 PHYSICAL EXAM GEN:  No distress NECK:  No jugular venous distention at 90 degrees, waveform within normal limits, carotid upstroke brisk and symmetric, no bruits, no thyromegaly, conjunctiva injected LYMPHATICS:  No cervical adenopathy LUNGS:  Clear to auscultation bilaterally BACK:  No CVA tenderness CHEST:  Unremarkable HEART:  S1 and S2 within normal limits, no S3, no S4, no clicks, no rubs, no murmurs ABD:  Positive bowel sounds normal in frequency in pitch, no bruits, no rebound, no guarding, unable to assess midline mass or bruit with the patient seated,morbidly obese EXT:  2 plus pulses throughout, mild edema, no cyanosis no clubbing  EKG:  Sinus rhythm, rate 67, left axis deviation, interventricular conduction  delay nonspecific. No change from previous. 07/31/2012  ASSESSMENT AND PLAN  Acute on Chronic Combined Systolic and Diastolic CHF:  She seems to be euvolemic.  She will continue the meds as listed.   Atrial Fibrillation:  She has had no symptomatic atrial fibrillation. She tolerates anticoagulation and she will continue with his therapy.  We will check a PT today.  Hypertension: Her blood pressure has been doing okay since I reduced lisinopril. Even though it is low today she is not symptomatic. He will continue on the same meds as listed. I

## 2012-08-17 ENCOUNTER — Ambulatory Visit: Payer: Medicare Other | Admitting: Physician Assistant

## 2012-08-17 ENCOUNTER — Ambulatory Visit (INDEPENDENT_AMBULATORY_CARE_PROVIDER_SITE_OTHER): Payer: Medicare Other | Admitting: *Deleted

## 2012-08-17 DIAGNOSIS — Z7901 Long term (current) use of anticoagulants: Secondary | ICD-10-CM

## 2012-08-17 DIAGNOSIS — I4891 Unspecified atrial fibrillation: Secondary | ICD-10-CM

## 2012-09-01 ENCOUNTER — Ambulatory Visit (INDEPENDENT_AMBULATORY_CARE_PROVIDER_SITE_OTHER): Payer: Medicare Other | Admitting: *Deleted

## 2012-09-01 DIAGNOSIS — I4891 Unspecified atrial fibrillation: Secondary | ICD-10-CM

## 2012-09-01 DIAGNOSIS — Z7901 Long term (current) use of anticoagulants: Secondary | ICD-10-CM

## 2012-09-07 ENCOUNTER — Ambulatory Visit (INDEPENDENT_AMBULATORY_CARE_PROVIDER_SITE_OTHER): Payer: Medicare Other | Admitting: *Deleted

## 2012-09-07 ENCOUNTER — Other Ambulatory Visit: Payer: Self-pay | Admitting: Cardiology

## 2012-09-07 DIAGNOSIS — Z7901 Long term (current) use of anticoagulants: Secondary | ICD-10-CM

## 2012-09-07 DIAGNOSIS — I4891 Unspecified atrial fibrillation: Secondary | ICD-10-CM

## 2012-09-07 LAB — POCT INR: INR: 1.9

## 2012-09-21 ENCOUNTER — Ambulatory Visit (INDEPENDENT_AMBULATORY_CARE_PROVIDER_SITE_OTHER): Payer: Medicare Other | Admitting: Pharmacist

## 2012-09-21 DIAGNOSIS — Z7901 Long term (current) use of anticoagulants: Secondary | ICD-10-CM

## 2012-09-21 DIAGNOSIS — I4891 Unspecified atrial fibrillation: Secondary | ICD-10-CM

## 2012-10-05 ENCOUNTER — Ambulatory Visit (INDEPENDENT_AMBULATORY_CARE_PROVIDER_SITE_OTHER): Payer: Medicare Other

## 2012-10-05 DIAGNOSIS — Z7901 Long term (current) use of anticoagulants: Secondary | ICD-10-CM

## 2012-10-05 DIAGNOSIS — I4891 Unspecified atrial fibrillation: Secondary | ICD-10-CM

## 2012-10-05 LAB — POCT INR: INR: 1.7

## 2012-10-19 ENCOUNTER — Ambulatory Visit (INDEPENDENT_AMBULATORY_CARE_PROVIDER_SITE_OTHER): Payer: Medicare Other

## 2012-10-19 DIAGNOSIS — Z7901 Long term (current) use of anticoagulants: Secondary | ICD-10-CM

## 2012-10-19 DIAGNOSIS — I4891 Unspecified atrial fibrillation: Secondary | ICD-10-CM

## 2012-10-19 LAB — POCT INR: INR: 1.5

## 2012-11-06 ENCOUNTER — Ambulatory Visit (INDEPENDENT_AMBULATORY_CARE_PROVIDER_SITE_OTHER): Payer: Medicare Other | Admitting: Pharmacist

## 2012-11-06 DIAGNOSIS — I4891 Unspecified atrial fibrillation: Secondary | ICD-10-CM

## 2012-11-06 DIAGNOSIS — Z7901 Long term (current) use of anticoagulants: Secondary | ICD-10-CM

## 2012-11-06 LAB — POCT INR: INR: 1.8

## 2012-11-20 ENCOUNTER — Ambulatory Visit (INDEPENDENT_AMBULATORY_CARE_PROVIDER_SITE_OTHER): Payer: Medicare Other | Admitting: *Deleted

## 2012-11-20 DIAGNOSIS — Z7901 Long term (current) use of anticoagulants: Secondary | ICD-10-CM

## 2012-11-20 DIAGNOSIS — I4891 Unspecified atrial fibrillation: Secondary | ICD-10-CM

## 2012-11-20 LAB — POCT INR: INR: 2.2

## 2012-11-28 ENCOUNTER — Encounter: Payer: Self-pay | Admitting: Physician Assistant

## 2012-11-28 ENCOUNTER — Ambulatory Visit (INDEPENDENT_AMBULATORY_CARE_PROVIDER_SITE_OTHER): Payer: Medicare Other | Admitting: Physician Assistant

## 2012-11-28 VITALS — BP 110/68 | HR 66 | Ht 60.0 in | Wt 246.0 lb

## 2012-11-28 DIAGNOSIS — I4891 Unspecified atrial fibrillation: Secondary | ICD-10-CM

## 2012-11-28 DIAGNOSIS — I5042 Chronic combined systolic (congestive) and diastolic (congestive) heart failure: Secondary | ICD-10-CM

## 2012-11-28 DIAGNOSIS — I10 Essential (primary) hypertension: Secondary | ICD-10-CM

## 2012-11-28 LAB — BASIC METABOLIC PANEL
Calcium: 8.9 mg/dL (ref 8.4–10.5)
Chloride: 102 mEq/L (ref 96–112)
GFR: 63.39 mL/min (ref >60.00)
Sodium: 136 mEq/L (ref 135–145)

## 2012-11-28 NOTE — Patient Instructions (Signed)
LAB TODAY; BMET  PLEASE FOLLOW UP WITH SCOTT WEAVER, PAC IN 4 MONTHS

## 2012-11-28 NOTE — Progress Notes (Signed)
[address] [place], [place]  [address] Phone: [phone] Fax:  [phone]  Date:  11/28/2012   ID:  [name], DOB December 08, 1934, MRN [id number]  PCP:  Astrid Divine, MD  Cardiologist:  Dr. Rollene Rotunda     History of Present Illness: [name] is a 78 y.o. female who returns for f/u.    She has a hx of combined systolic and diastolic CHF, DCM with an EF of 40%, HTN. She has had several admissions in 2013 with CHF. She was noted to be in new onset AFib in 10/2011 and placed on Coumadin. Of note, she spontaneously converted to NSR.  She was seen in the emergency room in 06/2012 and treated for pneumonia.  She was last seen in this office by Dr. Antoine Poche in 07/2012. She had had some hyponatremia and worsening renal function.  This was being followed by serial lab work. Her volume was stable when last seen. She remained in sinus rhythm. She was set up for routine follow up today.  Since last seen, she is overall doing well. Breathing is stable.  She walks with a walker in her house.  She is able to get dressed without dyspnea.  Denies orthopnea, PND.  LE edema remains down.  Weights are stable.  She has occasional CP when lying flat she attributes to gas.  She denies exertional CP.    Labs (9/13):   K 4.4, creatinine 1.2, hemoglobin 11.1, TSH 1.782   Labs (10/13): K 3.3, creatinine 1.1 Labs (2/14):   K 4.6 => 4.4, creatinine 1.0 => 1.0, BNP 258 Labs (5/14):   K 4.7, creatinine 0.96, ALT 7 Labs (5/14):   Na 127, K 3.6, creatinine 2, BNP 4914 Labs (6/14):   Na 128=>134, K 4, creatinine 1.8=> 1.1, BNP 106, Hgb 11.1   Wt Readings from Last 3 Encounters:  11/28/12 246 lb (111.585 kg)  07/31/12 243 lb 6.4 oz (110.406 kg)  07/06/12 248 lb 1.9 oz (112.546 kg)     Past Medical History  Diagnosis Date  . HTN (hypertension)   . Diverticulosis   . Hemorrhoid   . Osteoarthritis   . Constipation, chronic   . H/O: hysterectomy   . Systolic and diastolic CHF,  chronic     a. Echo 07/2011: Technically limited; inferior HK, inferolateral and possibly lateral wall HK, EF 40%, moderate LVE, aortic sclerosis without stenosis, mild MR, mild LAE, question RV dysfunction  . Female bladder prolapse     with prolapse uterus, cystocele, s/p TVH/BSO 12/2006  . Obesity (BMI 30-39.9)     wt. 94.1 kg 12/2010  . Moderate mitral regurgitation     a. Mod to Sev by echo 01/2009;  b. mild MR by echo 2013  . Atrial fibrillation 10/13/2011    coumadin Rx  . Iron deficiency anemia     Current Outpatient Prescriptions  Medication Sig Dispense Refill  . bisacodyl (DULCOLAX) 5 MG EC tablet Take 5 mg by mouth daily. For      . carvedilol (COREG) 6.25 MG tablet Take 1 tablet (6.25 mg total) by mouth 2 (two) times daily with a meal.  60 tablet  5  . ferrous fumarate (FERRETTS) 325 (106 FE) MG TABS Take 1 tablet by mouth 2 (two) times daily.       . furosemide (LASIX) 80 MG tablet Take 1 tablet (80 mg total) by mouth 2 (two) times daily.  60 tablet  6  . HYDROcodone-acetaminophen (VICODIN) 5-500  MG per tablet Take 1 tablet by mouth every 8 (eight) hours as needed for pain.       Marland Kitchen lisinopril (PRINIVIL,ZESTRIL) 5 MG tablet Take 1 tablet (5 mg total) by mouth daily.  30 tablet  11  . metolazone (ZAROXOLYN) 5 MG tablet Take 5 mg by mouth as needed. Take with morning dose of lasix      . traMADol (ULTRAM) 50 MG tablet Take 50 mg by mouth every 8 (eight) hours as needed for pain. For pain      . warfarin (COUMADIN) 2.5 MG tablet Take 2.5-5 mg by mouth See admin instructions. Wynelle Link., Tues., and Thurs. 5 mg.   Mon. Wed., Fri., Sat.  3.7 mg      . warfarin (COUMADIN) 2.5 MG tablet Take 1 tablet (2.5 mg total) by mouth as directed.  60 tablet  2  . potassium chloride SA (K-DUR,KLOR-CON) 20 MEQ tablet Take 40 mEq by mouth 3 (three) times daily.       No current facility-administered medications for this visit.    Allergies:    Allergies  Allergen Reactions  . Penicillins Other (See  Comments)    unknown  . Tetanus-Diphtheria Toxoids Td Other (See Comments)    unknown  . Latex Hives and Rash  . Levaquin [Levofloxacin In D5w] Rash    Social History:  The patient  reports that she has never smoked. Her smokeless tobacco use includes Snuff. She reports that she does not drink alcohol or use illicit drugs.   Family History:  The patient's family history includes Cancer in her brother; Diabetes in her brother and father; Hypertension in her mother; Leukemia in her brother; Stroke in her mother.   ROS:  Please see the history of present illness.   She has bilateral hand pain and swelling.  She has a cough with clear sputum.   All other systems reviewed and negative.   PHYSICAL EXAM: VS:  BP 110/68  Pulse 66  Ht 5' (1.524 m)  Wt 246 lb (111.585 kg)  BMI 48.04 kg/m2 Well nourished, well developed, in no acute distress HEENT: normal Neck: no JVD at 90 Cardiac:  normal S1, S2; RRR; no murmur Lungs:  clear to auscultation bilaterally, no wheezing, rhonchi or rales Abd: soft, nontender, no hepatomegaly Ext: no edema Skin: warm and dry Neuro:  CNs 2-12 intact, no focal abnormalities noted  EKG:  NSR, HR 66, LAD, no change from prior tracing     ASSESSMENT AND PLAN:  1. Chronic Combine Diastolic and Systolic CHF:  Volume stable. Check followup basic metabolic panel today. Continue current therapy. We discussed watching her sodium intake around the holidays. She knows to contact us if her weight increases over a 24-hour period. 2. Atrial Fibrillation:  Maintaining NSR. She remains on Coumadin. 3. Hypertension:  Controlled. 4. Disposition:  Follow up with me in 4 months.  Signed, Tereso Newcomer, PA-C  11/28/2012 11:48 AM

## 2012-11-30 ENCOUNTER — Other Ambulatory Visit: Payer: Self-pay | Admitting: Cardiology

## 2012-11-30 ENCOUNTER — Other Ambulatory Visit: Payer: Self-pay | Admitting: *Deleted

## 2012-12-11 ENCOUNTER — Ambulatory Visit (INDEPENDENT_AMBULATORY_CARE_PROVIDER_SITE_OTHER): Payer: Medicare Other | Admitting: Pharmacist

## 2012-12-11 DIAGNOSIS — Z7901 Long term (current) use of anticoagulants: Secondary | ICD-10-CM

## 2012-12-11 DIAGNOSIS — I4891 Unspecified atrial fibrillation: Secondary | ICD-10-CM

## 2013-01-10 ENCOUNTER — Ambulatory Visit (INDEPENDENT_AMBULATORY_CARE_PROVIDER_SITE_OTHER): Payer: Medicare Other | Admitting: Pharmacist

## 2013-01-10 DIAGNOSIS — Z7901 Long term (current) use of anticoagulants: Secondary | ICD-10-CM

## 2013-01-10 DIAGNOSIS — I4891 Unspecified atrial fibrillation: Secondary | ICD-10-CM

## 2013-01-10 LAB — POCT INR: INR: 3.4

## 2013-01-16 ENCOUNTER — Other Ambulatory Visit: Payer: Self-pay | Admitting: Cardiology

## 2013-02-08 DIAGNOSIS — K922 Gastrointestinal hemorrhage, unspecified: Secondary | ICD-10-CM

## 2013-02-08 DIAGNOSIS — Z9289 Personal history of other medical treatment: Secondary | ICD-10-CM

## 2013-02-08 HISTORY — DX: Gastrointestinal hemorrhage, unspecified: K92.2

## 2013-02-08 HISTORY — DX: Personal history of other medical treatment: Z92.89

## 2013-02-12 ENCOUNTER — Other Ambulatory Visit: Payer: Self-pay | Admitting: Cardiology

## 2013-02-13 ENCOUNTER — Encounter (INDEPENDENT_AMBULATORY_CARE_PROVIDER_SITE_OTHER): Payer: Self-pay

## 2013-02-13 ENCOUNTER — Ambulatory Visit (INDEPENDENT_AMBULATORY_CARE_PROVIDER_SITE_OTHER): Payer: Medicare Other | Admitting: *Deleted

## 2013-02-13 DIAGNOSIS — I4891 Unspecified atrial fibrillation: Secondary | ICD-10-CM

## 2013-02-13 DIAGNOSIS — Z7901 Long term (current) use of anticoagulants: Secondary | ICD-10-CM

## 2013-02-13 LAB — POCT INR: INR: 5

## 2013-02-24 ENCOUNTER — Other Ambulatory Visit: Payer: Self-pay | Admitting: Cardiology

## 2013-02-28 ENCOUNTER — Ambulatory Visit (INDEPENDENT_AMBULATORY_CARE_PROVIDER_SITE_OTHER): Payer: Medicare Other | Admitting: Pharmacist

## 2013-02-28 DIAGNOSIS — I4891 Unspecified atrial fibrillation: Secondary | ICD-10-CM

## 2013-02-28 DIAGNOSIS — Z7901 Long term (current) use of anticoagulants: Secondary | ICD-10-CM

## 2013-02-28 LAB — POCT INR: INR: 1.4

## 2013-03-08 ENCOUNTER — Ambulatory Visit (INDEPENDENT_AMBULATORY_CARE_PROVIDER_SITE_OTHER): Payer: Medicare Other

## 2013-03-08 DIAGNOSIS — I4891 Unspecified atrial fibrillation: Secondary | ICD-10-CM

## 2013-03-08 DIAGNOSIS — Z7901 Long term (current) use of anticoagulants: Secondary | ICD-10-CM

## 2013-03-08 LAB — POCT INR: INR: 2.2

## 2013-03-14 ENCOUNTER — Ambulatory Visit (INDEPENDENT_AMBULATORY_CARE_PROVIDER_SITE_OTHER): Payer: Medicare Other | Admitting: Physician Assistant

## 2013-03-14 ENCOUNTER — Encounter: Payer: Self-pay | Admitting: Physician Assistant

## 2013-03-14 VITALS — BP 100/60 | HR 68 | Ht 60.0 in | Wt 230.0 lb

## 2013-03-14 DIAGNOSIS — I4891 Unspecified atrial fibrillation: Secondary | ICD-10-CM

## 2013-03-14 DIAGNOSIS — I10 Essential (primary) hypertension: Secondary | ICD-10-CM

## 2013-03-14 DIAGNOSIS — R05 Cough: Secondary | ICD-10-CM

## 2013-03-14 DIAGNOSIS — R634 Abnormal weight loss: Secondary | ICD-10-CM

## 2013-03-14 DIAGNOSIS — D649 Anemia, unspecified: Secondary | ICD-10-CM

## 2013-03-14 DIAGNOSIS — R059 Cough, unspecified: Secondary | ICD-10-CM

## 2013-03-14 DIAGNOSIS — I5042 Chronic combined systolic (congestive) and diastolic (congestive) heart failure: Secondary | ICD-10-CM

## 2013-03-14 DIAGNOSIS — R0602 Shortness of breath: Secondary | ICD-10-CM

## 2013-03-14 DIAGNOSIS — I428 Other cardiomyopathies: Secondary | ICD-10-CM

## 2013-03-14 LAB — CBC WITH DIFFERENTIAL/PLATELET
BASOS PCT: 0.2 % (ref 0.0–3.0)
Basophils Absolute: 0 10*3/uL (ref 0.0–0.1)
Eosinophils Absolute: 0.1 10*3/uL (ref 0.0–0.7)
Eosinophils Relative: 1.4 % (ref 0.0–5.0)
HEMATOCRIT: 35.2 % — AB (ref 36.0–46.0)
HEMOGLOBIN: 11.1 g/dL — AB (ref 12.0–15.0)
Lymphocytes Relative: 16.1 % (ref 12.0–46.0)
Lymphs Abs: 0.9 10*3/uL (ref 0.7–4.0)
MCHC: 31.6 g/dL (ref 30.0–36.0)
MCV: 99 fl (ref 78.0–100.0)
MONOS PCT: 2.5 % — AB (ref 3.0–12.0)
Monocytes Absolute: 0.1 10*3/uL (ref 0.1–1.0)
NEUTROS ABS: 4.6 10*3/uL (ref 1.4–7.7)
Neutrophils Relative %: 79.8 % — ABNORMAL HIGH (ref 43.0–77.0)
Platelets: 477 10*3/uL — ABNORMAL HIGH (ref 150.0–400.0)
RBC: 3.55 Mil/uL — ABNORMAL LOW (ref 3.87–5.11)
RDW: 16.3 % — ABNORMAL HIGH (ref 11.5–14.6)
WBC: 5.7 10*3/uL (ref 4.5–10.5)

## 2013-03-14 LAB — HEPATIC FUNCTION PANEL
ALBUMIN: 3.9 g/dL (ref 3.5–5.2)
ALK PHOS: 158 U/L — AB (ref 39–117)
ALT: 4 U/L (ref 0–35)
AST: 11 U/L (ref 0–37)
Bilirubin, Direct: 0 mg/dL (ref 0.0–0.3)
TOTAL PROTEIN: 8.1 g/dL (ref 6.0–8.3)
Total Bilirubin: 0.6 mg/dL (ref 0.3–1.2)

## 2013-03-14 LAB — TSH: TSH: 1.48 u[IU]/mL (ref 0.35–5.50)

## 2013-03-14 MED ORDER — LISINOPRIL 5 MG PO TABS: ORAL_TABLET | ORAL | Status: DC

## 2013-03-14 MED ORDER — FUROSEMIDE 80 MG PO TABS: ORAL_TABLET | ORAL | Status: DC

## 2013-03-14 MED ORDER — POTASSIUM CHLORIDE CRYS ER 20 MEQ PO TBCR: EXTENDED_RELEASE_TABLET | ORAL | Status: DC

## 2013-03-14 NOTE — Patient Instructions (Addendum)
Your physician has recommended you make the following change in your medication:  1) Decrease Lisinopril to 5 mg daily 2) Decrease Lasix to 80mg  in the am and 40mg  in the pm 3) Decrease Potassium to 75meq in the morning, 55meq in the afternoon, and 59meq in the evening  Labs Today: Bmet, Cbc, Tsh, Bnp, Lft  Your physician recommends that you return for lab work in: 1 week a Bmet  Your physician has requested that you have an echocardiogram. Echocardiography is a painless test that uses sound waves to create images of your heart. It provides your doctor with information about the size and shape of your heart and how well your heart's chambers and valves are working. This procedure takes approximately one hour. There are no restrictions for this procedure.  A chest x-ray takes a picture of the organs and structures inside the chest, including the heart, lungs, and blood vessels. This test can show several things, including, whether the heart is enlarges; whether fluid is building up in the lungs; and whether pacemaker / defibrillator leads are still in place.( To be done at [facility]. [address]st floor)  Your physician recommends that you schedule a follow-up appointment in: 1-2 weeks with Dr.Hochrein/ or Richardson Dopp, PA a day that Dr.Hochrein is in the office

## 2013-03-14 NOTE — Progress Notes (Signed)
[place] [place], [place], [place]  [address] Phone: [phone] Fax:  [phone]  Date:  03/14/2013   ID:  [name], DOB [birth year], MRN [id number]  PCP:  Osborne Casco, MD  Cardiologist:  Dr. Minus Breeding     History of Present Illness: [name] is a 79 y.o. female with a hx of combined systolic and diastolic CHF, DCM with an EF of 40%, HTN. She had several admissions in 2013 with CHF. She was noted to be in new onset AFib in 10/2011 and placed on Coumadin. Of note, she spontaneously converted to NSR.  She was seen in the emergency room in 06/2012 and treated for pneumonia.    I last saw her 11/28/2012. Volume stable at that time.    Patient is here with her daughter as usual. Her daughter has noted increased dyspnea, weakness and congestion. Of note, the patient has lost 16 pounds since last visit. She admits to poor appetite. She denies significant LE edema. She denies chest discomfort, orthopnea, PND.  She denies syncope. She denies fevers. She has had some chills.  She has had a cough with clear sputum production. She denies hemoptysis.  Recent Labs: 06/25/2012: ALT 7; AST 13  07/13/2012: Pro B Natriuretic peptide (BNP) 106.0*  07/31/2012: Hemoglobin 11.1*  11/28/2012: BUN 12; Creatinine 1.1; Potassium 4.7; Sodium 136    Wt Readings from Last 3 Encounters:  03/14/13 230 lb (104.327 kg)  11/28/12 246 lb (111.585 kg)  07/31/12 243 lb 6.4 oz (110.406 kg)     Past Medical History  Diagnosis Date  . HTN (hypertension)   . Diverticulosis   . Hemorrhoid   . Osteoarthritis   . Constipation, chronic   . H/O: hysterectomy   . Systolic and diastolic CHF, chronic     a. Echo 07/2011: Technically limited; inferior HK, inferolateral and possibly lateral wall HK, EF 40%, moderate LVE, aortic sclerosis without stenosis, mild MR, mild LAE, question RV dysfunction  . Female bladder prolapse     with prolapse uterus, cystocele, s/p TVH/BSO 12/2006  . Obesity (BMI  30-39.9)     wt. 94.1 kg 12/2010  . Moderate mitral regurgitation     a. Mod to Sev by echo 01/2009;  b. mild MR by echo 2013  . Atrial fibrillation 10/13/2011    coumadin Rx  . Iron deficiency anemia     Current Outpatient Prescriptions  Medication Sig Dispense Refill  . bisacodyl (DULCOLAX) 5 MG EC tablet Take 5 mg by mouth daily. For      . carvedilol (COREG) 6.25 MG tablet take 1 tablet by mouth twice a day with food  60 tablet  5  . ferrous fumarate (FERRETTS) 325 (106 FE) MG TABS Take 1 tablet by mouth 2 (two) times daily.       . furosemide (LASIX) 80 MG tablet take 1 tablet by mouth twice a day  60 tablet  6  . HYDROcodone-acetaminophen (VICODIN) 5-500 MG per tablet Take 1 tablet by mouth every 8 (eight) hours as needed for pain.       Marland Kitchen lisinopril (PRINIVIL,ZESTRIL) 10 MG tablet take 1 tablet by mouth once daily  30 tablet  6  . metolazone (ZAROXOLYN) 5 MG tablet Take 5 mg by mouth as needed. Take with morning dose of lasix      . potassium chloride SA (K-DUR,KLOR-CON) 20 MEQ tablet Take 40 mEq by mouth 3 (three) times daily.      . traMADol Veatrice Bourbon)  50 MG tablet Take 50 mg by mouth every 8 (eight) hours as needed for pain. For pain      . warfarin (COUMADIN) 2.5 MG tablet take as directed BY COUMADIN CLINIC  70 tablet  2   No current facility-administered medications for this visit.    Allergies:    Allergies  Allergen Reactions  . Penicillins Other (See Comments)    unknown  . Tetanus-Diphtheria Toxoids Td Other (See Comments)    unknown  . Latex Hives and Rash  . Levaquin [Levofloxacin In D5w] Rash    Social History:  The patient  reports that she has never smoked. Her smokeless tobacco use includes Snuff. She reports that she does not drink alcohol or use illicit drugs.   Family History:  The patient's family history includes Cancer in her brother; Diabetes in her brother and father; Hypertension in her mother; Leukemia in her brother; Stroke in her mother.   ROS:   Please see the history of present illness.   She does describe easy bruisability. She notes chronic black stools (on iron therapy).  She denies depression.  All other systems reviewed and negative.   PHYSICAL EXAM: VS:  BP 100/60  Pulse 68  Ht 5' (1.524 m)  Wt 230 lb (104.327 kg)  BMI 44.92 kg/m2 Well nourished, well developed, in no acute distress HEENT: normal Neck: no JVD at 90 Cardiac:  normal S1, S2; RRR; 1-6/1 systolic murmur @ LLSB Lungs:  Decreased breath sounds, Possible faint crackles at the right base otherwise clear without wheezing or rales Abd: soft, nontender, no hepatomegaly Ext: no edema Skin: warm and dry Neuro:  CNs 2-12 intact, no focal abnormalities noted  EKG:  NSR, HR 68, RAD, poor R wave progression, nonspecific ST-T wave changes, no change from prior tracings     ASSESSMENT AND PLAN:  1. Weight loss: Etiology not entirely clear. Patient does admit to poor appetite. She denies symptoms of depression. Obtain lab workup with a basic metabolic panel, BNP, CBC, TSH and LFTs. She has had a cough.  Will also obtain a chest x-ray. I will also obtain an echocardiogram to reassess her LV function (see below). If workup is unremarkable, she will need follow up with primary care for further evaluation. 2. Chronic Combined Diastolic and Systolic CHF:  Volume appears stable.  EF has been 40% in the past and has been treated conservatively.  She has noted some dyspnea.  She does not look volume overloaded on exam.  She has lost a considerable amount of weight.  Obtain BMET and BNP.  Question if EF getting worse (?cardiac cachexia).  Obtain Echo to check LVF.   3. Atrial Fibrillation:  Maintaining NSR. She remains on Coumadin.  She does have a hx of iron deficiency anemia.  Obtain CBC to rule out significant anemia.  4. Hypertension:  Controlled.  BP actually somewhat low.  Will decrease Lisinopril to 5 QD and Lasix to 80 in A and 40 in P with K+ decreased to 40 in A, 40 at Noon  and 20 in P.  Check BMET in 1 week.  5. Disposition:  Follow up with me or Dr. Minus Breeding in 1-2 weeks.  Signed, Richardson Dopp, PA-C  03/14/2013 11:34 AM

## 2013-03-16 ENCOUNTER — Telehealth: Payer: Self-pay | Admitting: *Deleted

## 2013-03-16 DIAGNOSIS — R945 Abnormal results of liver function studies: Principal | ICD-10-CM

## 2013-03-16 DIAGNOSIS — R7989 Other specified abnormal findings of blood chemistry: Secondary | ICD-10-CM

## 2013-03-16 NOTE — Telephone Encounter (Signed)
pt's daughter notified about lab results. Will repeat bmet and get add 5 nucleotidase 2/13 when she CVRR and echo appt same day.

## 2013-03-21 ENCOUNTER — Other Ambulatory Visit (HOSPITAL_COMMUNITY): Payer: Medicare Other

## 2013-03-22 ENCOUNTER — Ambulatory Visit
Admission: RE | Admit: 2013-03-22 | Discharge: 2013-03-22 | Disposition: A | Discharge status: Home / Self Care | Payer: Medicare Other | Source: Ambulatory Visit | Attending: Physician Assistant | Admitting: Physician Assistant

## 2013-03-22 DIAGNOSIS — R059 Cough, unspecified: Secondary | ICD-10-CM

## 2013-03-22 DIAGNOSIS — R05 Cough: Secondary | ICD-10-CM

## 2013-03-22 DIAGNOSIS — R0602 Shortness of breath: Secondary | ICD-10-CM

## 2013-03-23 ENCOUNTER — Telehealth: Payer: Self-pay | Admitting: *Deleted

## 2013-03-23 ENCOUNTER — Other Ambulatory Visit: Payer: Medicare Other

## 2013-03-23 ENCOUNTER — Other Ambulatory Visit (HOSPITAL_COMMUNITY): Payer: Medicare Other

## 2013-03-23 NOTE — Telephone Encounter (Signed)
s/w pt about cxr results. pt verbalized understanding to cxr results today

## 2013-03-30 ENCOUNTER — Ambulatory Visit: Payer: Medicare Other | Admitting: Physician Assistant

## 2013-04-10 ENCOUNTER — Ambulatory Visit (INDEPENDENT_AMBULATORY_CARE_PROVIDER_SITE_OTHER): Payer: Medicare Other | Admitting: Physician Assistant

## 2013-04-10 ENCOUNTER — Encounter: Payer: Self-pay | Admitting: Physician Assistant

## 2013-04-10 ENCOUNTER — Ambulatory Visit (INDEPENDENT_AMBULATORY_CARE_PROVIDER_SITE_OTHER): Payer: Medicare Other | Admitting: *Deleted

## 2013-04-10 VITALS — BP 140/80 | HR 66 | Ht 60.0 in | Wt 232.0 lb

## 2013-04-10 DIAGNOSIS — I10 Essential (primary) hypertension: Secondary | ICD-10-CM

## 2013-04-10 DIAGNOSIS — R634 Abnormal weight loss: Secondary | ICD-10-CM

## 2013-04-10 DIAGNOSIS — R945 Abnormal results of liver function studies: Secondary | ICD-10-CM

## 2013-04-10 DIAGNOSIS — Z5181 Encounter for therapeutic drug level monitoring: Secondary | ICD-10-CM | POA: Insufficient documentation

## 2013-04-10 DIAGNOSIS — I4891 Unspecified atrial fibrillation: Secondary | ICD-10-CM

## 2013-04-10 DIAGNOSIS — Z7901 Long term (current) use of anticoagulants: Secondary | ICD-10-CM

## 2013-04-10 DIAGNOSIS — R7989 Other specified abnormal findings of blood chemistry: Secondary | ICD-10-CM

## 2013-04-10 DIAGNOSIS — I5042 Chronic combined systolic (congestive) and diastolic (congestive) heart failure: Secondary | ICD-10-CM

## 2013-04-10 LAB — BASIC METABOLIC PANEL
BUN: 12 mg/dL (ref 6–23)
CO2: 25 mEq/L (ref 19–32)
CREATININE: 1.1 mg/dL (ref 0.4–1.2)
Calcium: 8.9 mg/dL (ref 8.4–10.5)
Chloride: 104 mEq/L (ref 96–112)
GFR: 64.01 mL/min (ref >60.00)
Glucose, Bld: 72 mg/dL (ref 70–99)
Potassium: 5.2 mEq/L — ABNORMAL HIGH (ref 3.5–5.1)
Sodium: 133 mEq/L — ABNORMAL LOW (ref 135–145)

## 2013-04-10 LAB — HEPATIC FUNCTION PANEL
ALK PHOS: 164 U/L — AB (ref 39–117)
ALT: 5 U/L (ref 0–35)
AST: 9 U/L (ref 0–37)
Albumin: 3.7 g/dL (ref 3.5–5.2)
BILIRUBIN DIRECT: 0 mg/dL (ref 0.0–0.3)
TOTAL PROTEIN: 7.5 g/dL (ref 6.0–8.3)
Total Bilirubin: 0.6 mg/dL (ref 0.3–1.2)

## 2013-04-10 LAB — POCT INR: INR: 1

## 2013-04-10 NOTE — Progress Notes (Signed)
[place], [address] [place], [place]  [address] Phone: [phone] Fax:  [phone]  Date:  04/10/2013   ID:  [name], DOB 1934/11/12, MRN [id number]  PCP:  Osborne Casco, MD  Cardiologist:  Dr. Minus Breeding     History of Present Illness: [name] is a 79 y.o. female with a hx of combined systolic and diastolic CHF, DCM with an EF of 40%, HTN. She had several admissions in 2013 with CHF. She was noted to be in new onset AFib in 10/2011 and placed on Coumadin. Of note, she spontaneously converted to NSR.    I saw her last month. They were reports of increased dyspnea. The patient had lost approximately 16 pounds. Etiology was not clear for her weight loss. Chest x-ray was obtained. There were no acute findings. Labs were obtained.  She had chronic, stable, mild anemia.  LFTs demonstrated chronically elevated ALP. Otherwise this was unremarkable. Basic metabolic panel was never obtained. I asked that the patient have an echocardiogram. This was not obtained. I did cut back on her Lasix as well as her lisinopril due to low blood pressure.  She returns today for follow up. She is here with her daughter as usual. She notes that her breathing is stable. She sleeps on 2 pillows chronically. She denies PND. She denies LE edema. She denies chest pain or syncope. Her biggest complaint is that of arthralgias. She has no appetite. She denies fevers or night sweats.  Recent Labs: 07/13/2012: Pro B Natriuretic peptide (BNP) 106.0*  11/28/2012: BUN 12; Creatinine 1.1; Potassium 4.7; Sodium 136  03/14/2013: ALT 4; AST 11; Hemoglobin 11.1*; TSH 1.48    Wt Readings from Last 3 Encounters:  04/10/13 232 lb (105.235 kg)  03/14/13 230 lb (104.327 kg)  11/28/12 246 lb (111.585 kg)     Past Medical History  Diagnosis Date  . HTN (hypertension)   . Diverticulosis   . Hemorrhoid   . Osteoarthritis   . Constipation, chronic   . H/O: hysterectomy   . Systolic and diastolic CHF,  chronic     a. Echo 07/2011: Technically limited; inferior HK, inferolateral and possibly lateral wall HK, EF 40%, moderate LVE, aortic sclerosis without stenosis, mild MR, mild LAE, question RV dysfunction  . Female bladder prolapse     with prolapse uterus, cystocele, s/p TVH/BSO 12/2006  . Obesity (BMI 30-39.9)     wt. 94.1 kg 12/2010  . Moderate mitral regurgitation     a. Mod to Sev by echo 01/2009;  b. mild MR by echo 2013  . Atrial fibrillation 10/13/2011    coumadin Rx  . Iron deficiency anemia     Current Outpatient Prescriptions  Medication Sig Dispense Refill  . bisacodyl (DULCOLAX) 5 MG EC tablet Take 5 mg by mouth daily. For      . carvedilol (COREG) 6.25 MG tablet take 1 tablet by mouth twice a day with food  60 tablet  5  . ferrous fumarate (FERRETTS) 325 (106 FE) MG TABS Take 1 tablet by mouth 2 (two) times daily.       . furosemide (LASIX) 80 MG tablet Take 80mg  in the am, 40mg  in the pm  60 tablet  6  . HYDROcodone-acetaminophen (VICODIN) 5-500 MG per tablet Take 1 tablet by mouth every 8 (eight) hours as needed for pain.       Marland Kitchen lisinopril (PRINIVIL,ZESTRIL) 5 MG tablet take 1 tablet by mouth once daily      .  metolazone (ZAROXOLYN) 5 MG tablet Take 5 mg by mouth as needed. Take with morning dose of lasix      . potassium chloride SA (K-DUR,KLOR-CON) 20 MEQ tablet Take 40mg  in the am, 40mg  in the afternoon, 20mg  in the evening      . traMADol (ULTRAM) 50 MG tablet Take 50 mg by mouth every 8 (eight) hours as needed for pain. For pain      . warfarin (COUMADIN) 2.5 MG tablet take as directed BY COUMADIN CLINIC  70 tablet  2   No current facility-administered medications for this visit.    Allergies:    Allergies  Allergen Reactions  . Penicillins Other (See Comments)    unknown  . Tetanus-Diphtheria Toxoids Td Other (See Comments)    unknown  . Latex Hives and Rash  . Levaquin [Levofloxacin In D5w] Rash    Social History:  The patient  reports that she has  never smoked. Her smokeless tobacco use includes Snuff. She reports that she does not drink alcohol or use illicit drugs.   Family History:  The patient's family history includes Cancer in her brother; Diabetes in her brother and father; Hypertension in her mother; Leukemia in her brother; Stroke in her mother.   ROS:  Please see the history of present illness.     All other systems reviewed and negative.   PHYSICAL EXAM: VS:  BP 140/80  Pulse 66  Ht 5' (1.524 m)  Wt 232 lb (105.235 kg)  BMI 45.31 kg/m2 Well nourished, well developed, in no acute distress HEENT: normal Neck: no JVD at 90 Cardiac:  normal S1, S2; RRR; 2-2/2 systolic murmur @ LLSB Lungs:  Decreased breath sounds, no wheezing or rales Abd: soft, nontender, no hepatomegaly Ext: no edema Skin: warm and dry Neuro:  CNs 2-12 intact, no focal abnormalities noted  EKG:  NSR, HR 66, LBBB     ASSESSMENT AND PLAN:  1. Weight loss: Etiology not entirely clear. Recent lab work unremarkable.  I will obtain a basic metabolic panel today. Obtain follow up LFTs. Also obtain 5'-nucleotidase. If she continues to have elevated ALP, I will recommend follow up with primary care for further investigation. 2. Chronic Combined Diastolic and Systolic CHF:  Volume appears stable.  Obtain Echo to check LVF.  Check BMET today.  Continue current Rx.  3. Atrial Fibrillation:  Maintaining NSR. She remains on Coumadin.  Recent Hgb stable.   4. Hypertension:  Controlled.  Continue current Rx Check BMET.  5. Disposition:  Follow up with Dr. Minus Breeding in 3 mos.  Signed, Richardson Dopp, PA-C  04/10/2013 12:27 PM

## 2013-04-10 NOTE — Patient Instructions (Addendum)
LAB WORK TODAY; BMET, LFT, 5 NUCLEOTIDASE  PT NEEDS TO RESCHEDULE ECHO DUE TO WEATHER LAST WEEK; PLEASE SEE IF CAN BE DONE SAME DAY AS A COUMADIN DAY  Your physician recommends that you schedule a follow-up appointment in: 3 MONTHS WITH DR. HOCHREIN  NO CHANGES WITH YOUR MEDICATIONS TODAY

## 2013-04-11 ENCOUNTER — Telehealth: Payer: Self-pay | Admitting: *Deleted

## 2013-04-11 DIAGNOSIS — I5042 Chronic combined systolic (congestive) and diastolic (congestive) heart failure: Secondary | ICD-10-CM

## 2013-04-11 NOTE — Telephone Encounter (Signed)
lmptcb for lab results and med changes 

## 2013-04-11 NOTE — Telephone Encounter (Signed)
lmptcb x 2 for lab results and med change

## 2013-04-12 MED ORDER — POTASSIUM CHLORIDE CRYS ER 20 MEQ PO TBCR
EXTENDED_RELEASE_TABLET | ORAL | Status: DC
Start: 2013-04-12 — End: 2013-04-18

## 2013-04-12 NOTE — Telephone Encounter (Signed)
lmptcb x 3 for lab results and K+ dose change. On the last 2 messages I did say to decrease K+ to 40 meq BID, but to call so we can discusss lab results.  I finally reached pt and we went over her lab results and to decrease K+ to 40 meq bid, bmet 3/10 when she comes in for CVRR appt. Pt verbalized understanding to Plan of Care.

## 2013-04-15 LAB — NUCLEOTIDASE, 5', BLOOD: 5 NUCLEOTIDASE: 3 U/L (ref <11)

## 2013-04-16 ENCOUNTER — Telehealth: Payer: Self-pay | Admitting: *Deleted

## 2013-04-16 NOTE — Telephone Encounter (Signed)
pt notified about lab results and to f/u w/PCP ASAP due to lab results. Pt verbalized understanding to results and to Plan of Care

## 2013-04-17 ENCOUNTER — Ambulatory Visit (INDEPENDENT_AMBULATORY_CARE_PROVIDER_SITE_OTHER): Payer: Medicare Other | Admitting: Pharmacist Clinician (PhC)/ Clinical Pharmacy Specialist

## 2013-04-17 ENCOUNTER — Other Ambulatory Visit (INDEPENDENT_AMBULATORY_CARE_PROVIDER_SITE_OTHER): Payer: Medicare Other

## 2013-04-17 DIAGNOSIS — I5042 Chronic combined systolic (congestive) and diastolic (congestive) heart failure: Secondary | ICD-10-CM

## 2013-04-17 DIAGNOSIS — Z7901 Long term (current) use of anticoagulants: Secondary | ICD-10-CM

## 2013-04-17 DIAGNOSIS — I4891 Unspecified atrial fibrillation: Secondary | ICD-10-CM

## 2013-04-17 DIAGNOSIS — Z5181 Encounter for therapeutic drug level monitoring: Secondary | ICD-10-CM

## 2013-04-17 LAB — BASIC METABOLIC PANEL
BUN: 12 mg/dL (ref 6–23)
CALCIUM: 8.6 mg/dL (ref 8.4–10.5)
CHLORIDE: 106 meq/L (ref 96–112)
CO2: 19 mEq/L (ref 19–32)
CREATININE: 1.1 mg/dL (ref 0.4–1.2)
GFR: 64.7 mL/min (ref >60.00)
Glucose, Bld: 82 mg/dL (ref 70–99)
Potassium: 5.5 mEq/L — ABNORMAL HIGH (ref 3.5–5.1)
Sodium: 136 mEq/L (ref 135–145)

## 2013-04-17 LAB — POCT INR: INR: 1.5

## 2013-04-18 ENCOUNTER — Telehealth: Payer: Self-pay | Admitting: *Deleted

## 2013-04-18 DIAGNOSIS — I5042 Chronic combined systolic (congestive) and diastolic (congestive) heart failure: Secondary | ICD-10-CM

## 2013-04-18 MED ORDER — POTASSIUM CHLORIDE CRYS ER 20 MEQ PO TBCR: EXTENDED_RELEASE_TABLET | ORAL | Status: DC

## 2013-04-18 NOTE — Telephone Encounter (Signed)
pt notified about lab results and to HOLD K+ today and re-start 3/12 K+ 20 meq BID, bmet 3/17.Marland KitchenPt verbalized understanding to Plan of Care.

## 2013-04-24 ENCOUNTER — Telehealth: Payer: Self-pay | Admitting: *Deleted

## 2013-04-24 ENCOUNTER — Other Ambulatory Visit (INDEPENDENT_AMBULATORY_CARE_PROVIDER_SITE_OTHER): Payer: Medicare Other

## 2013-04-24 ENCOUNTER — Ambulatory Visit (INDEPENDENT_AMBULATORY_CARE_PROVIDER_SITE_OTHER): Payer: Medicare Other | Admitting: *Deleted

## 2013-04-24 DIAGNOSIS — Z5181 Encounter for therapeutic drug level monitoring: Secondary | ICD-10-CM

## 2013-04-24 DIAGNOSIS — I5043 Acute on chronic combined systolic (congestive) and diastolic (congestive) heart failure: Secondary | ICD-10-CM

## 2013-04-24 DIAGNOSIS — Z7901 Long term (current) use of anticoagulants: Secondary | ICD-10-CM

## 2013-04-24 DIAGNOSIS — I4891 Unspecified atrial fibrillation: Secondary | ICD-10-CM

## 2013-04-24 DIAGNOSIS — I5042 Chronic combined systolic (congestive) and diastolic (congestive) heart failure: Secondary | ICD-10-CM

## 2013-04-24 LAB — BASIC METABOLIC PANEL
BUN: 14 mg/dL (ref 6–23)
CALCIUM: 8.6 mg/dL (ref 8.4–10.5)
CO2: 21 meq/L (ref 19–32)
CREATININE: 1 mg/dL (ref 0.4–1.2)
Chloride: 105 mEq/L (ref 96–112)
GFR: 66.14 mL/min (ref >60.00)
Glucose, Bld: 85 mg/dL (ref 70–99)
Potassium: 5.3 mEq/L — ABNORMAL HIGH (ref 3.5–5.1)
Sodium: 134 mEq/L — ABNORMAL LOW (ref 135–145)

## 2013-04-24 LAB — POCT INR: INR: 4.3

## 2013-04-24 NOTE — Telephone Encounter (Signed)
pt notified about lab results with verbal understanding, hold K+ until instructed by cardiology, bmet 3/27.

## 2013-04-27 ENCOUNTER — Encounter: Payer: Self-pay | Admitting: Cardiovascular Disease

## 2013-04-27 ENCOUNTER — Ambulatory Visit (HOSPITAL_COMMUNITY): Payer: Medicare Other | Attending: Physician Assistant | Admitting: Radiology

## 2013-04-27 ENCOUNTER — Other Ambulatory Visit: Payer: Self-pay

## 2013-04-27 DIAGNOSIS — I428 Other cardiomyopathies: Secondary | ICD-10-CM

## 2013-04-27 DIAGNOSIS — I4891 Unspecified atrial fibrillation: Secondary | ICD-10-CM | POA: Diagnosis not present

## 2013-04-27 DIAGNOSIS — R0602 Shortness of breath: Secondary | ICD-10-CM | POA: Diagnosis not present

## 2013-04-27 DIAGNOSIS — I5042 Chronic combined systolic (congestive) and diastolic (congestive) heart failure: Secondary | ICD-10-CM

## 2013-04-27 DIAGNOSIS — I5043 Acute on chronic combined systolic (congestive) and diastolic (congestive) heart failure: Secondary | ICD-10-CM

## 2013-04-27 DIAGNOSIS — I509 Heart failure, unspecified: Secondary | ICD-10-CM

## 2013-04-27 NOTE — Progress Notes (Signed)
Echocardiogram performed.  

## 2013-04-30 ENCOUNTER — Encounter: Payer: Self-pay | Admitting: Physician Assistant

## 2013-04-30 ENCOUNTER — Telehealth: Payer: Self-pay | Admitting: *Deleted

## 2013-04-30 NOTE — Telephone Encounter (Signed)
pt notified about echo results with verbal understanding and also to f/u w/PCP about abnormal LFT and weight loss.

## 2013-05-04 ENCOUNTER — Other Ambulatory Visit: Payer: Medicare Other

## 2013-05-08 ENCOUNTER — Ambulatory Visit (INDEPENDENT_AMBULATORY_CARE_PROVIDER_SITE_OTHER): Payer: Medicare Other | Admitting: *Deleted

## 2013-05-08 DIAGNOSIS — I4891 Unspecified atrial fibrillation: Secondary | ICD-10-CM

## 2013-05-08 DIAGNOSIS — Z5181 Encounter for therapeutic drug level monitoring: Secondary | ICD-10-CM

## 2013-05-08 DIAGNOSIS — Z7901 Long term (current) use of anticoagulants: Secondary | ICD-10-CM

## 2013-05-08 LAB — PROTIME-INR
INR: 9.1 ratio — AB (ref 0.8–1.0)
PROTHROMBIN TIME: 91.8 s — AB (ref 10.2–12.4)

## 2013-05-08 LAB — POCT INR: INR: 8

## 2013-05-16 ENCOUNTER — Ambulatory Visit (INDEPENDENT_AMBULATORY_CARE_PROVIDER_SITE_OTHER): Payer: Medicare Other | Admitting: Pharmacist

## 2013-05-16 DIAGNOSIS — I4891 Unspecified atrial fibrillation: Secondary | ICD-10-CM

## 2013-05-16 DIAGNOSIS — Z5181 Encounter for therapeutic drug level monitoring: Secondary | ICD-10-CM

## 2013-05-16 DIAGNOSIS — Z7901 Long term (current) use of anticoagulants: Secondary | ICD-10-CM

## 2013-05-16 LAB — POCT INR: INR: 2.9

## 2013-05-24 ENCOUNTER — Other Ambulatory Visit: Payer: Self-pay | Admitting: Cardiology

## 2013-05-28 ENCOUNTER — Ambulatory Visit (INDEPENDENT_AMBULATORY_CARE_PROVIDER_SITE_OTHER): Payer: Medicare Other | Admitting: *Deleted

## 2013-05-28 DIAGNOSIS — Z7901 Long term (current) use of anticoagulants: Secondary | ICD-10-CM

## 2013-05-28 DIAGNOSIS — I4891 Unspecified atrial fibrillation: Secondary | ICD-10-CM

## 2013-05-28 DIAGNOSIS — Z5181 Encounter for therapeutic drug level monitoring: Secondary | ICD-10-CM

## 2013-05-28 LAB — POCT INR: INR: 2.8

## 2013-06-03 ENCOUNTER — Inpatient Hospital Stay (HOSPITAL_COMMUNITY): Payer: Medicare Other

## 2013-06-03 ENCOUNTER — Emergency Department (HOSPITAL_COMMUNITY): Payer: Medicare Other

## 2013-06-03 ENCOUNTER — Encounter (HOSPITAL_COMMUNITY): Payer: Self-pay | Admitting: Emergency Medicine

## 2013-06-03 ENCOUNTER — Inpatient Hospital Stay (HOSPITAL_COMMUNITY)
Admission: EM | Admit: 2013-06-03 | Discharge: 2013-06-07 | DRG: 682 | Disposition: A | Discharge status: Skilled Nursing Facility | Payer: Medicare Other | Attending: Internal Medicine | Admitting: Internal Medicine

## 2013-06-03 DIAGNOSIS — G9340 Encephalopathy, unspecified: Secondary | ICD-10-CM

## 2013-06-03 DIAGNOSIS — E669 Obesity, unspecified: Secondary | ICD-10-CM | POA: Diagnosis present

## 2013-06-03 DIAGNOSIS — D75839 Thrombocytosis, unspecified: Secondary | ICD-10-CM

## 2013-06-03 DIAGNOSIS — E162 Hypoglycemia, unspecified: Secondary | ICD-10-CM | POA: Diagnosis present

## 2013-06-03 DIAGNOSIS — I5042 Chronic combined systolic (congestive) and diastolic (congestive) heart failure: Secondary | ICD-10-CM

## 2013-06-03 DIAGNOSIS — E860 Dehydration: Secondary | ICD-10-CM

## 2013-06-03 DIAGNOSIS — I959 Hypotension, unspecified: Secondary | ICD-10-CM | POA: Diagnosis present

## 2013-06-03 DIAGNOSIS — E876 Hypokalemia: Secondary | ICD-10-CM

## 2013-06-03 DIAGNOSIS — E871 Hypo-osmolality and hyponatremia: Secondary | ICD-10-CM

## 2013-06-03 DIAGNOSIS — M1990 Unspecified osteoarthritis, unspecified site: Secondary | ICD-10-CM | POA: Diagnosis present

## 2013-06-03 DIAGNOSIS — I509 Heart failure, unspecified: Secondary | ICD-10-CM | POA: Diagnosis present

## 2013-06-03 DIAGNOSIS — Z8719 Personal history of other diseases of the digestive system: Secondary | ICD-10-CM

## 2013-06-03 DIAGNOSIS — R109 Unspecified abdominal pain: Secondary | ICD-10-CM

## 2013-06-03 DIAGNOSIS — R531 Weakness: Secondary | ICD-10-CM

## 2013-06-03 DIAGNOSIS — Z6841 Body Mass Index (BMI) 40.0 and over, adult: Secondary | ICD-10-CM

## 2013-06-03 DIAGNOSIS — N179 Acute kidney failure, unspecified: Principal | ICD-10-CM

## 2013-06-03 DIAGNOSIS — G8929 Other chronic pain: Secondary | ICD-10-CM

## 2013-06-03 DIAGNOSIS — R609 Edema, unspecified: Secondary | ICD-10-CM

## 2013-06-03 DIAGNOSIS — E869 Volume depletion, unspecified: Secondary | ICD-10-CM | POA: Diagnosis present

## 2013-06-03 DIAGNOSIS — Z887 Allergy status to serum and vaccine status: Secondary | ICD-10-CM

## 2013-06-03 DIAGNOSIS — R600 Localized edema: Secondary | ICD-10-CM

## 2013-06-03 DIAGNOSIS — I428 Other cardiomyopathies: Secondary | ICD-10-CM

## 2013-06-03 DIAGNOSIS — Z91040 Latex allergy status: Secondary | ICD-10-CM

## 2013-06-03 DIAGNOSIS — J9600 Acute respiratory failure, unspecified whether with hypoxia or hypercapnia: Secondary | ICD-10-CM | POA: Diagnosis present

## 2013-06-03 DIAGNOSIS — I059 Rheumatic mitral valve disease, unspecified: Secondary | ICD-10-CM | POA: Diagnosis present

## 2013-06-03 DIAGNOSIS — I340 Nonrheumatic mitral (valve) insufficiency: Secondary | ICD-10-CM

## 2013-06-03 DIAGNOSIS — D6832 Hemorrhagic disorder due to extrinsic circulating anticoagulants: Secondary | ICD-10-CM

## 2013-06-03 DIAGNOSIS — D473 Essential (hemorrhagic) thrombocythemia: Secondary | ICD-10-CM

## 2013-06-03 DIAGNOSIS — D72819 Decreased white blood cell count, unspecified: Secondary | ICD-10-CM

## 2013-06-03 DIAGNOSIS — I4891 Unspecified atrial fibrillation: Secondary | ICD-10-CM

## 2013-06-03 DIAGNOSIS — I272 Pulmonary hypertension, unspecified: Secondary | ICD-10-CM

## 2013-06-03 DIAGNOSIS — Z7901 Long term (current) use of anticoagulants: Secondary | ICD-10-CM

## 2013-06-03 DIAGNOSIS — I5030 Unspecified diastolic (congestive) heart failure: Secondary | ICD-10-CM

## 2013-06-03 DIAGNOSIS — Z833 Family history of diabetes mellitus: Secondary | ICD-10-CM

## 2013-06-03 DIAGNOSIS — I5043 Acute on chronic combined systolic (congestive) and diastolic (congestive) heart failure: Secondary | ICD-10-CM

## 2013-06-03 DIAGNOSIS — Z5181 Encounter for therapeutic drug level monitoring: Secondary | ICD-10-CM

## 2013-06-03 DIAGNOSIS — I2789 Other specified pulmonary heart diseases: Secondary | ICD-10-CM | POA: Diagnosis present

## 2013-06-03 DIAGNOSIS — D689 Coagulation defect, unspecified: Secondary | ICD-10-CM | POA: Diagnosis present

## 2013-06-03 DIAGNOSIS — Z8249 Family history of ischemic heart disease and other diseases of the circulatory system: Secondary | ICD-10-CM

## 2013-06-03 DIAGNOSIS — Z823 Family history of stroke: Secondary | ICD-10-CM

## 2013-06-03 DIAGNOSIS — D509 Iron deficiency anemia, unspecified: Secondary | ICD-10-CM

## 2013-06-03 DIAGNOSIS — K5909 Other constipation: Secondary | ICD-10-CM

## 2013-06-03 DIAGNOSIS — I10 Essential (primary) hypertension: Secondary | ICD-10-CM

## 2013-06-03 DIAGNOSIS — Z881 Allergy status to other antibiotic agents status: Secondary | ICD-10-CM

## 2013-06-03 DIAGNOSIS — R112 Nausea with vomiting, unspecified: Secondary | ICD-10-CM | POA: Diagnosis present

## 2013-06-03 DIAGNOSIS — K921 Melena: Secondary | ICD-10-CM

## 2013-06-03 DIAGNOSIS — N8110 Cystocele, unspecified: Secondary | ICD-10-CM

## 2013-06-03 DIAGNOSIS — R1084 Generalized abdominal pain: Secondary | ICD-10-CM | POA: Diagnosis present

## 2013-06-03 DIAGNOSIS — T45515A Adverse effect of anticoagulants, initial encounter: Secondary | ICD-10-CM

## 2013-06-03 DIAGNOSIS — Z880 Allergy status to penicillin: Secondary | ICD-10-CM

## 2013-06-03 DIAGNOSIS — D638 Anemia in other chronic diseases classified elsewhere: Secondary | ICD-10-CM | POA: Diagnosis present

## 2013-06-03 LAB — COMPREHENSIVE METABOLIC PANEL
ALK PHOS: 153 U/L — AB (ref 39–117)
ALT: 41 U/L — AB (ref 0–35)
AST: 49 U/L — ABNORMAL HIGH (ref 0–37)
Albumin: 3.8 g/dL (ref 3.5–5.2)
BILIRUBIN TOTAL: 0.6 mg/dL (ref 0.3–1.2)
BUN: 36 mg/dL — AB (ref 6–23)
CHLORIDE: 96 meq/L (ref 96–112)
CO2: 24 meq/L (ref 19–32)
Calcium: 9.5 mg/dL (ref 8.4–10.5)
Creatinine, Ser: 1.31 mg/dL — ABNORMAL HIGH (ref 0.50–1.10)
GFR, EST AFRICAN AMERICAN: 45 mL/min — AB (ref >90)
GFR, EST NON AFRICAN AMERICAN: 38 mL/min — AB (ref >90)
Glucose, Bld: 81 mg/dL (ref 70–99)
POTASSIUM: 3.4 meq/L — AB (ref 3.7–5.3)
Sodium: 138 mEq/L (ref 137–147)
Total Protein: 7.7 g/dL (ref 6.0–8.3)

## 2013-06-03 LAB — CBC WITH DIFFERENTIAL/PLATELET
Basophils Absolute: 0 10*3/uL (ref 0.0–0.1)
Basophils Relative: 0 % (ref 0–1)
EOS ABS: 0 10*3/uL (ref 0.0–0.7)
Eosinophils Relative: 0 % (ref 0–5)
HCT: 30.6 % — ABNORMAL LOW (ref 36.0–46.0)
HEMOGLOBIN: 9.9 g/dL — AB (ref 12.0–15.0)
Lymphocytes Relative: 12 % (ref 12–46)
Lymphs Abs: 1 10*3/uL (ref 0.7–4.0)
MCH: 30.7 pg (ref 26.0–34.0)
MCHC: 32.4 g/dL (ref 30.0–36.0)
MCV: 95 fL (ref 78.0–100.0)
MONOS PCT: 8 % (ref 3–12)
Monocytes Absolute: 0.6 10*3/uL (ref 0.1–1.0)
NEUTROS ABS: 6.2 10*3/uL (ref 1.7–7.7)
Neutrophils Relative %: 80 % — ABNORMAL HIGH (ref 43–77)
Platelets: 317 10*3/uL (ref 150–400)
RBC: 3.22 MIL/uL — AB (ref 3.87–5.11)
RDW: 14.4 % (ref 11.5–15.5)
WBC: 7.8 10*3/uL (ref 4.0–10.5)

## 2013-06-03 LAB — POCT I-STAT 3, ART BLOOD GAS (G3+)
Acid-Base Excess: 2 mmol/L (ref 0.0–2.0)
Bicarbonate: 25.3 mEq/L — ABNORMAL HIGH (ref 20.0–24.0)
Bicarbonate: 27.2 mEq/L — ABNORMAL HIGH (ref 20.0–24.0)
O2 SAT: 98 %
O2 Saturation: 96 %
PH ART: 7.401 (ref 7.350–7.450)
PH ART: 7.383 (ref 7.350–7.450)
PO2 ART: 101 mmHg — AB (ref 80.0–100.0)
Patient temperature: 98
TCO2: 27 mmol/L (ref 0–100)
TCO2: 29 mmol/L (ref 0–100)
pCO2 arterial: 40.7 mmHg (ref 35.0–45.0)
pCO2 arterial: 45.5 mmHg — ABNORMAL HIGH (ref 35.0–45.0)
pO2, Arterial: 84 mmHg (ref 80.0–100.0)

## 2013-06-03 LAB — URINALYSIS, ROUTINE W REFLEX MICROSCOPIC
Bilirubin Urine: NEGATIVE
Glucose, UA: NEGATIVE mg/dL
Glucose, UA: NEGATIVE mg/dL
HGB URINE DIPSTICK: NEGATIVE
HGB URINE DIPSTICK: NEGATIVE
KETONES UR: 15 mg/dL — AB
Ketones, ur: NEGATIVE mg/dL
NITRITE: NEGATIVE
Nitrite: NEGATIVE
PH: 6 (ref 5.0–8.0)
PROTEIN: NEGATIVE mg/dL
Protein, ur: NEGATIVE mg/dL
Specific Gravity, Urine: 1.019 (ref 1.005–1.030)
Specific Gravity, Urine: 1.036 — ABNORMAL HIGH (ref 1.005–1.030)
UROBILINOGEN UA: 1 mg/dL (ref 0.0–1.0)
Urobilinogen, UA: 1 mg/dL (ref 0.0–1.0)
pH: 5.5 (ref 5.0–8.0)

## 2013-06-03 LAB — GLUCOSE, CAPILLARY
GLUCOSE-CAPILLARY: 109 mg/dL — AB (ref 70–99)
GLUCOSE-CAPILLARY: 60 mg/dL — AB (ref 70–99)
GLUCOSE-CAPILLARY: 89 mg/dL (ref 70–99)
Glucose-Capillary: 67 mg/dL — ABNORMAL LOW (ref 70–99)
Glucose-Capillary: 117 mg/dL — ABNORMAL HIGH (ref 70–99)
Glucose-Capillary: 79 mg/dL (ref 70–99)

## 2013-06-03 LAB — URINE MICROSCOPIC-ADD ON

## 2013-06-03 LAB — MRSA PCR SCREENING: MRSA BY PCR: NEGATIVE

## 2013-06-03 LAB — PHOSPHORUS: PHOSPHORUS: 3.2 mg/dL (ref 2.3–4.6)

## 2013-06-03 LAB — PROTIME-INR
INR: 10.09 (ref 0.00–1.49)
Prothrombin Time: 75.9 seconds — ABNORMAL HIGH (ref 11.6–15.2)

## 2013-06-03 LAB — MAGNESIUM: Magnesium: 2.6 mg/dL — ABNORMAL HIGH (ref 1.5–2.5)

## 2013-06-03 LAB — PRO B NATRIURETIC PEPTIDE: PRO B NATRI PEPTIDE: 27115 pg/mL — AB (ref 0–450)

## 2013-06-03 LAB — LIPASE, BLOOD: Lipase: 9 U/L — ABNORMAL LOW (ref 11–59)

## 2013-06-03 LAB — ETHANOL: Alcohol, Ethyl (B): <11 mg/dL (ref 0–11)

## 2013-06-03 LAB — TSH: TSH: 2.07 u[IU]/mL (ref 0.350–4.500)

## 2013-06-03 LAB — TROPONIN I
Troponin I: <0.3 ng/mL (ref <0.30)
Troponin I: <0.3 ng/mL (ref <0.30)

## 2013-06-03 MED ORDER — FUROSEMIDE 10 MG/ML IJ SOLN
INTRAMUSCULAR | Status: AC
  Administered 2013-06-03: 20 mg
  Filled 2013-06-03: qty 4

## 2013-06-03 MED ORDER — SODIUM CHLORIDE 0.9 % IV SOLN: INTRAVENOUS | Status: DC

## 2013-06-03 MED ORDER — HYDROCODONE-ACETAMINOPHEN 5-325 MG PO TABS: 1.0000 | ORAL_TABLET | ORAL | Status: DC | PRN

## 2013-06-03 MED ORDER — HEPARIN SODIUM (PORCINE) 5000 UNIT/ML IJ SOLN: 5000.0000 [IU] | Freq: Three times a day (TID) | INTRAMUSCULAR | Status: DC

## 2013-06-03 MED ORDER — CARVEDILOL 6.25 MG PO TABS
6.2500 mg | ORAL_TABLET | Freq: Two times a day (BID) | ORAL | Status: DC
  Administered 2013-06-04: 6.25 mg via ORAL
  Filled 2013-06-03 (×4): qty 1

## 2013-06-03 MED ORDER — IOHEXOL 300 MG/ML  SOLN
25.0000 mL | INTRAMUSCULAR | Status: AC | PRN
  Administered 2013-06-03: 25 mL via ORAL

## 2013-06-03 MED ORDER — SODIUM CHLORIDE 0.9 % IJ SOLN: 3.0000 mL | Freq: Two times a day (BID) | INTRAMUSCULAR | Status: DC

## 2013-06-03 MED ORDER — HYDROMORPHONE HCL PF 1 MG/ML IJ SOLN: 0.5000 mg | INTRAMUSCULAR | Status: DC | PRN

## 2013-06-03 MED ORDER — SODIUM CHLORIDE 0.9 % IJ SOLN: 3.0000 mL | INTRAMUSCULAR | Status: DC | PRN

## 2013-06-03 MED ORDER — SODIUM CHLORIDE 0.9 % IV SOLN: 250.0000 mL | INTRAVENOUS | Status: DC | PRN

## 2013-06-03 MED ORDER — ONDANSETRON HCL 4 MG/2ML IJ SOLN: 4.0000 mg | Freq: Four times a day (QID) | INTRAMUSCULAR | Status: DC | PRN

## 2013-06-03 MED ORDER — BISACODYL 5 MG PO TBEC
5.0000 mg | DELAYED_RELEASE_TABLET | Freq: Every day | ORAL | Status: DC
  Administered 2013-06-05 – 2013-06-06 (×2): 5 mg via ORAL
  Filled 2013-06-03 (×2): qty 1

## 2013-06-03 MED ORDER — FERROUS FUMARATE 325 (106 FE) MG PO TABS
1.0000 | ORAL_TABLET | Freq: Two times a day (BID) | ORAL | Status: DC
  Administered 2013-06-03 – 2013-06-07 (×8): 106 mg via ORAL
  Filled 2013-06-03 (×10): qty 1

## 2013-06-03 MED ORDER — ONDANSETRON HCL 4 MG/2ML IJ SOLN: 4.0000 mg | Freq: Three times a day (TID) | INTRAMUSCULAR | Status: DC | PRN

## 2013-06-03 MED ORDER — IOHEXOL 300 MG/ML  SOLN
80.0000 mL | Freq: Once | INTRAMUSCULAR | Status: AC | PRN
  Administered 2013-06-03: 80 mL via INTRAVENOUS

## 2013-06-03 MED ORDER — SODIUM CHLORIDE 0.9 % IJ SOLN
3.0000 mL | Freq: Two times a day (BID) | INTRAMUSCULAR | Status: DC
  Administered 2013-06-03: 3 mL via INTRAVENOUS

## 2013-06-03 MED ORDER — ONDANSETRON HCL 4 MG/2ML IJ SOLN
4.0000 mg | Freq: Once | INTRAMUSCULAR | Status: AC
  Administered 2013-06-03: 4 mg via INTRAVENOUS
  Filled 2013-06-03: qty 2

## 2013-06-03 MED ORDER — MORPHINE SULFATE 4 MG/ML IJ SOLN
4.0000 mg | Freq: Once | INTRAMUSCULAR | Status: AC
  Administered 2013-06-03: 4 mg via INTRAVENOUS
  Filled 2013-06-03: qty 1

## 2013-06-03 MED ORDER — METOLAZONE 5 MG PO TABS
5.0000 mg | ORAL_TABLET | Freq: Every day | ORAL | Status: DC
  Filled 2013-06-03 (×2): qty 1

## 2013-06-03 MED ORDER — ONDANSETRON HCL 4 MG PO TABS: 4.0000 mg | ORAL_TABLET | Freq: Four times a day (QID) | ORAL | Status: DC | PRN

## 2013-06-03 MED ORDER — VITAMIN K1 10 MG/ML IJ SOLN
10.0000 mg | Freq: Once | INTRAVENOUS | Status: AC
  Administered 2013-06-03: 10 mg via INTRAVENOUS
  Filled 2013-06-03: qty 1

## 2013-06-03 MED ORDER — SODIUM CHLORIDE 0.9 % IV BOLUS (SEPSIS)
1000.0000 mL | Freq: Once | INTRAVENOUS | Status: AC
  Administered 2013-06-03: 1000 mL via INTRAVENOUS

## 2013-06-03 MED ORDER — FUROSEMIDE 40 MG PO TABS
40.0000 mg | ORAL_TABLET | Freq: Two times a day (BID) | ORAL | Status: DC
  Filled 2013-06-03 (×2): qty 1

## 2013-06-03 MED ORDER — INSULIN ASPART 100 UNIT/ML ~~LOC~~ SOLN: 0.0000 [IU] | Freq: Three times a day (TID) | SUBCUTANEOUS | Status: DC

## 2013-06-03 NOTE — ED Provider Notes (Signed)
CSN: [id number]     Arrival date & time 06/03/13  1002 History   First MD Initiated Contact with Patient 06/03/13 1009     Chief Complaint  Patient presents with  . Weakness     (Consider location/radiation/quality/duration/timing/severity/associated sxs/prior Treatment) HPI Comments: Patient is a 79 year old female with past medical history of obesity, hypertension, CHF, A. fib. She presents today with complaints of "hurting all over". She says been nauseated and reports not being able to keep anything down for the past 4 days. She denies any fevers or chills. She denies any diarrhea or constipation. She does report generalized abdominal pain.  Patient is a 79 y.o. female presenting with weakness. The history is provided by the patient.  Weakness This is a new problem. Episode onset: 4 days ago. The problem occurs constantly. The problem has been gradually worsening. Associated symptoms include abdominal pain. Nothing aggravates the symptoms. Nothing relieves the symptoms. She has tried nothing for the symptoms. The treatment provided no relief.    Past Medical History  Diagnosis Date  . HTN (hypertension)   . Diverticulosis   . Hemorrhoid   . Osteoarthritis   . Constipation, chronic   . H/O: hysterectomy   . Systolic and diastolic CHF, chronic     a. Echo 07/2011: Technically limited; inferior HK, inferolateral and possibly lateral wall HK, EF 40%, moderate LVE, aortic sclerosis without stenosis, mild MR, mild LAE, question RV dysfunction;   b.  Echo (04/2013): EF 50-55%, normal wall motion, moderate MR, mild to moderate LAE, PASP 37  . Female bladder prolapse     with prolapse uterus, cystocele, s/p TVH/BSO 12/2006  . Obesity (BMI 30-39.9)     wt. 94.1 kg 12/2010  . Moderate mitral regurgitation     a. Mod to Sev by echo 01/2009;  b. mild MR by echo 2013  . Atrial fibrillation 10/13/2011    coumadin Rx  . Iron deficiency anemia    Past Surgical History  Procedure Laterality Date   . Wrist surgery    . Vaginal hysterectomy,cystocele and prolapse  repair  NOV. 2008  . Abdominal hysterectomy     Family History  Problem Relation Age of Onset  . Diabetes Brother   . Hypertension Mother     deceased @ 54  . Leukemia Brother   . Cancer Brother   . Stroke Mother   . Diabetes Father     deceased in his 59's   History  Substance Use Topics  . Smoking status: Never Smoker   . Smokeless tobacco: Current User    Types: Snuff     Comment: Uses Snuff regularly.  Dips several x/day.  A "large can" can last her up to 3 mos.  . Alcohol Use: No   OB History   Grav Para Term Preterm Abortions TAB SAB Ect Mult Living   6              Review of Systems  Constitutional: Positive for appetite change and fatigue. Negative for fever and chills.  Gastrointestinal: Positive for nausea, vomiting and abdominal pain. Negative for diarrhea and constipation.  Neurological: Positive for weakness.  All other systems reviewed and are negative.     Allergies  Penicillins; Tetanus-diphtheria toxoids td; Latex; and Levaquin  Home Medications   Prior to Admission medications   Medication Sig Start Date End Date Taking? Authorizing Provider  bisacodyl (DULCOLAX) 5 MG EC tablet Take 5 mg by mouth daily. For    Historical Provider, MD  carvedilol (COREG) 6.25 MG tablet take 1 tablet by mouth twice a day with food 01/16/13   Minus Breeding, MD  ferrous fumarate (FERRETTS) 325 (106 FE) MG TABS Take 1 tablet by mouth 2 (two) times daily.     Historical Provider, MD  furosemide (LASIX) 80 MG tablet Take 80mg  in the am, 40mg  in the pm 03/14/13   Liliane Shi, PA-C  HYDROcodone-acetaminophen (VICODIN) 5-500 MG per tablet Take 1 tablet by mouth every 8 (eight) hours as needed for pain.  09/16/11   Historical Provider, MD  lisinopril (PRINIVIL,ZESTRIL) 5 MG tablet take 1 tablet by mouth once daily 03/14/13   Liliane Shi, PA-C  metolazone (ZAROXOLYN) 5 MG tablet Take 5 mg by mouth as needed.  Take with morning dose of lasix 12/10/11   Liliane Shi, PA-C  potassium chloride SA (K-DUR,KLOR-CON) 20 MEQ tablet Take 1 tab TWICE DAILY 04/18/13   Liliane Shi, PA-C  traMADol (ULTRAM) 50 MG tablet Take 50 mg by mouth every 8 (eight) hours as needed for pain. For pain    Historical Provider, MD  warfarin (COUMADIN) 2.5 MG tablet of 1 tablet daily or as directed by coumadin clinic 05/24/13   Minus Breeding, MD   BP 145/82  Pulse 79  Temp(Src) 97.9 F (36.6 C) (Oral)  Resp 14  Ht 5\' 1"  (1.549 m)  Wt 240 lb (108.863 kg)  BMI 45.37 kg/m2  SpO2 93% Physical Exam  Nursing note and vitals reviewed. Constitutional: She is oriented to person, place, and time. No distress.  HENT:  Head: Normocephalic and atraumatic.  Neck: Normal range of motion. Neck supple.  Cardiovascular: Normal rate, regular rhythm and normal heart sounds.   No murmur heard. Pulmonary/Chest: Effort normal and breath sounds normal.  Abdominal: Soft. Bowel sounds are normal. She exhibits no distension. There is tenderness.  Abdomen is obese There is tenderness to palpation in all 4 quadrants with no rebound and no guarding.  Musculoskeletal: Normal range of motion. She exhibits no edema.  Neurological: She is alert and oriented to person, place, and time.  Skin: Skin is warm and dry. She is not diaphoretic.    ED Course  Procedures (including critical care time) Labs Review Labs Reviewed  CBC WITH DIFFERENTIAL  COMPREHENSIVE METABOLIC PANEL  LIPASE, BLOOD  URINALYSIS, ROUTINE W REFLEX MICROSCOPIC  TROPONIN I    Imaging Review No results found.   EKG Interpretation   Date/Time:  Sunday June 03 2013 10:39:30 EDT Ventricular Rate:  71 PR Interval:  186 QRS Duration: 150 QT Interval:  418 QTC Calculation: 454 R Axis:   -85 Text Interpretation:  Sinus rhythm Nonspecific IVCD with LAD Left  ventricular hypertrophy Probable anterior infarct, age indeterminate  Confirmed by DELOS  MD, Alyx Gee ([id number]) on  06/03/2013 11:09:24 AM      MDM   Final diagnoses:  None    Patient is a 79 year old female with extensive past medical history. She presents with complaints of nausea, vomiting, and generalized malaise. He is also having some abdominal pain. Workup revealed no elevation of white count however her electrolytes are suggestive of dehydration. She was also found to have an INR of 10.1 for which I've given vitamin K. As she is trending toward acute renal failure, I feel as though admission for IV hydration and correction of her coagulopathy are indicated. I've spoken with Dr. Doyle Askew from the hospitalist service who is willing to admit.    Veryl Speak, MD 06/03/13 [phone]

## 2013-06-03 NOTE — Progress Notes (Signed)
[facility] hospitalist progress note. Chief complaint. Lethargy. History of present illness. This 79 year old female was admitted earlier with abdominal pain and associated nausea and vomiting. Etiology unclear time of admission. She had a supratherapeutic INR 10.09 on Coumadin and was given 5 mg of vitamin K with Coumadin now on hold. Patient had an event earlier today post admission where she became lethargic and hypoxic on room air. This was a sudden change in level of consciousness with the plan to transfer patient to ICU, chest x-ray, CT head, arterial blood gas. Glucose however was found to be low at 60 and with supplemental glucose level of consciousness return to baseline. Patient has not had a second incident of lethargy today and should change. Nursing informs me that she was noted alert and oriented at 6 p.m. and by 7 p.m. was noted lethargic and difficult to arouse. A capillary glucose was obtained and this was 89. I ordered an arterial blood gas and this resulted pH 7.383, PCO2 45.5, PO2 84, bicarbonate 27.2. CO2 retention I did not appear to be a causative factor her ABG. Given her elevated INR a cerebral bleed was a consideration and the patient was sent for a stat CT scan of the brain. This was obtained and showed no intracranial pathology, particularly no intracranial bleeding. At the time the patient arrived back from CT scan she was alert and oriented and in no distress. Vital signs. Temperature 97.8, pulse 63, respirations 17, blood pressure 110/65. O2 sats 100%. General appearance. Obese elderly female who I found a hypersomnolent at bedside exam prior to CT scan. She would open her eyes with tactile stimulation but would not respond verbally and minimally followed commands. Cardiac. Rate and rhythm regular. Lungs. Diminished sounds in the bases. No evidence of distress. Stable O2 sats. Abdomen. Soft with positive bowel sounds. No evidence of pain with palpation. Neurologic. Somnolent but  arousable. Nonverbal. Minimally follows commands. Will grip my hands bilaterally but weak her on left. She pushes on my hands with both feet. I could not get her legs. Impression/plan. Problem #1. Lethargy. Etiology at this point is unclear. Hypoglycemia does not appear to be an issue currently. CO2 retention is not evidenced by an ABG. CT scan of the brain found no acute issues. Patient spontaneously return to baseline, alert, verbal, oriented following the CT scan. No evidence of seizure activity observed. No history of seizures per chart review. Given the patient's stable status we'll continue to monitor at this point I would have a low threshold for consultation neurology if any further changes in mentation demonstrated.

## 2013-06-03 NOTE — Progress Notes (Signed)
Kathline Magic at bedside to see pt. Stat head CT performed. After completion of CT pt became more arousable and is now alert and oriented x4 with no complaints. Kathline Magic notified. Will continue to monitor.

## 2013-06-03 NOTE — Progress Notes (Addendum)
Pt appears very lethargic upon assessment. Unable to answer questions or keep eyes open. Vital signs and blood sugars remain stable. Notified Kathline Magic, NP. New orders given. PCCM also made aware. Will continue to monitor closely.

## 2013-06-03 NOTE — ED Notes (Signed)
IV team and phlebotomy at bedside to attempt IV access and blood draw.

## 2013-06-03 NOTE — ED Notes (Signed)
Pt arrived to CT scan with only a 24g in top of right hand- #22g nexiva catheter inserted in rt ac x 1 attempt

## 2013-06-03 NOTE — Progress Notes (Signed)
Received to unit via stretcher from the emergency department.  Client moved over to bed.  It is noted that client is now lethargic and lips are purple.  Oxygen saturation 83% on room air.  Started on oxygen via nasal cannula at 4l/minute. Rapid Response, MD and charge nurse notified of situation.  Lung sounds diminished with crackles at base.    1650 Blood sugar 67.  Client is more alert, but, still groggy.  1659  Oxygen saturation now up to 96% on 4L via nasal cannula.  Head of bed elevated.  MD has called and ordered bed in the ICU.  Rapid at bedside. More alert now.  Respiratory at bedside.    1717 Client taken to ICU.  Answering questions appropriately on arrival to the unit.  PT/INR elevated.  Report from ED given to intake nurse.      See vital sign sheet.

## 2013-06-03 NOTE — Significant Event (Signed)
Rapid Response Event Note  Overview:  Called for new admit with sats 83% on RA Time Called: 1645 Arrival Time: 1647 Event Type: Respiratory  Initial Focused Assessment:   AS per RN, patient just transferred to unit and noted to have sats 83% on RA.  Upon my arrival to patients room, Rn at bedside.  Patient is awake and oriented, Crackles at the bases.  Patient on nasal cannula 4 lpm sats 100%, 118/75, RR 24.  Skin cool and dry, pale.  Patient denies any pain or SOB.   Interventions:  Repositioned  patient in bed, MD called and updated.  Patient to be transferred to SDU.  Rn to call if assistance needed.   Event Summary:   at      at          [facility]

## 2013-06-03 NOTE — H&P (Addendum)
[facility] History and Physical  [name] VQM:[id number] DOB: [birth year] DOA: 06/03/2013  Referring physician: ED physician PCP: Osborne Casco, MD   Chief Complaint: abdominal pain   HPI:  79 year old female with past medical history of obesity, hypertension, CHF, A. Fib, presented to [facility] ED with main concern of sudden onset of generalized abdominal pain constant and throbbing, 5/10 in severity, associated with N/V, poor oral intake. This initially started 4 days prior to this admission but has gotten much worse. She denies fevers, chills, chest pain, shortness of breath, no specific urinary concerns, no blood in stool or urine, no focal neurological symptoms.   In ED, pt noted to be hemodynamically stable, CT abdomen unremarkable for acute events, INR noted to be > 10. TRH asked to admit to telemetry bed.   Assessment and Plan: Active Problems: Abdominal pain with N/V - unclear etiology at this time, CT abd with no specific abnormality noted - will admit to telemetry bed - start with providing supportive care with IVF, analgesia, antiemetics as needed - keep NPO for now Supra therapeutic INR - possibly related to principal problem - vit K 5 mg dose given in ED - close monitoring, hold Coumadin  CHF - hold Lasix and place on IVF HTN - hold Lisinopril due to ARF ARF - hold lisinopril and lasix for now - provide IVF and repeat BMP in AM Transaminitis  - will check Alcohol level but etiology is not very clear Anemia of chronic disease - pt denies any noticeable bleeding - close monitoring due to elevated INR Hypokalemia - will supplement - repeat BMP in AM  Radiological Exams on Admission: Ct Abdomen Pelvis W Contrast  06/03/2013   CLINICAL DATA:  Abdominal pain and vomiting.  Weakness.  EXAM: CT ABDOMEN AND PELVIS WITH CONTRAST  TECHNIQUE: Multidetector CT imaging of the abdomen and pelvis was performed using the standard protocol following bolus  administration of intravenous contrast.  CONTRAST:  80 mL Omnipaque 300  COMPARISON:  06/26/2012  FINDINGS: Minimal scarring and/or atelectasis is noted in the lung bases. Heart is incompletely visualized but appears enlarged.  Streak artifact is present in the upper abdomen due to patient's inability to raise her arms. The liver appears slightly heterogeneous in density, however this may be due to superimposed streak artifact. No definite focal liver lesion is identified. The gallbladder, spleen, right adrenal gland, and pancreas have an unremarkable enhanced appearance. There is mild diffuse thickening of the left adrenal gland. Small right lower pole renal cysts measure 1.4 and 1.7 cm, similar to the prior study. Multiple left renal cysts also do not appear significantly changed, the largest measuring 3.3 cm and arising from the lower pole. There is no hydronephrosis.  There is a small hiatal hernia. Oral contrast is present in nondilated loops of small bowel to the level the proximal colon without evidence of obstruction. There is left-sided colonic diverticulosis without evidence of diverticulitis. Appendix is not clearly identified, however no inflammatory changes are present in the right lower quadrant.  Uterus is absent. Ovaries are unremarkable. Bladder is unremarkable. Mild to moderate aortoiliac atherosclerotic calcification is noted. No free fluid or enlarged lymph nodes are identified. Multilevel degenerative disc disease with vacuum disc phenomenon is noted in the lower thoracic spine. Mild multilevel degenerative disc disease and facet arthrosis are present in the lower lumbar spine, with unchanged grade 1 anterolisthesis of L4 on L5.  IMPRESSION: No acute abnormality identified in the abdomen or pelvis. No etiology for abdominal pain  identified.   Electronically Signed   By: Logan Bores   On: 06/03/2013 15:31     Code Status: Full Family Communication: Pt at bedside Disposition Plan: Admit for  further evaluation     Review of Systems:  Constitutional: Negative for diaphoresis.  HENT: Negative for hearing loss, ear pain, nosebleeds, congestion, sore throat, neck pain, tinnitus and ear discharge.   Eyes: Negative for blurred vision, double vision, photophobia, pain, discharge and redness.  Respiratory: Negative for cough, hemoptysis, sputum production, shortness of breath, wheezing and stridor.   Cardiovascular: Negative for chest pain, palpitations, orthopnea, claudication and leg swelling.  Gastrointestinal: Negative for heartburn, constipation, blood in stool and melena.  Genitourinary: Negative for dysuria, urgency, frequency, hematuria and flank pain.  Musculoskeletal: Negative for myalgias, back pain, joint pain and falls.  Skin: Negative for itching and rash.  Neurological: Negative for tingling, tremors, speech change, focal weakness, loss of consciousness and headaches.  Endo/Heme/Allergies: Negative for environmental allergies and polydipsia. Does not bruise/bleed easily.  Psychiatric/Behavioral: Negative for suicidal ideas. The patient is not nervous/anxious.      Past Medical History  Diagnosis Date  . HTN (hypertension)   . Diverticulosis   . Hemorrhoid   . Osteoarthritis   . Constipation, chronic   . H/O: hysterectomy   . Systolic and diastolic CHF, chronic     a. Echo 07/2011: Technically limited; inferior HK, inferolateral and possibly lateral wall HK, EF 40%, moderate LVE, aortic sclerosis without stenosis, mild MR, mild LAE, question RV dysfunction;   b.  Echo (04/2013): EF 50-55%, normal wall motion, moderate MR, mild to moderate LAE, PASP 37  . Female bladder prolapse     with prolapse uterus, cystocele, s/p TVH/BSO 12/2006  . Obesity (BMI 30-39.9)     wt. 94.1 kg 12/2010  . Moderate mitral regurgitation     a. Mod to Sev by echo 01/2009;  b. mild MR by echo 2013  . Atrial fibrillation 10/13/2011    coumadin Rx  . Iron deficiency anemia     Past  Surgical History  Procedure Laterality Date  . Wrist surgery    . Vaginal hysterectomy,cystocele and prolapse  repair  NOV. 2008  . Abdominal hysterectomy      Social History:  reports that she has never smoked. Her smokeless tobacco use includes Snuff. She reports that she does not drink alcohol or use illicit drugs.  Allergies  Allergen Reactions  . Penicillins Other (See Comments)    unknown  . Tetanus-Diphtheria Toxoids Td Other (See Comments)    unknown  . Latex Hives and Rash  . Levaquin [Levofloxacin In D5w] Rash    Family History  Problem Relation Age of Onset  . Diabetes Brother   . Hypertension Mother     deceased @ 18  . Leukemia Brother   . Cancer Brother   . Stroke Mother   . Diabetes Father     deceased in his 69's    Prior to Admission medications   Medication Sig Start Date End Date Taking? Authorizing Provider  bisacodyl (DULCOLAX) 5 MG EC tablet Take 5 mg by mouth daily. For   Yes Historical Provider, MD  carvedilol (COREG) 6.25 MG tablet take 1 tablet by mouth twice a day with food 01/16/13  Yes Minus Breeding, MD  ferrous fumarate (FERRETTS) 325 (106 FE) MG TABS Take 1 tablet by mouth 2 (two) times daily.    Yes Historical Provider, MD  furosemide (LASIX) 80 MG tablet Take 80mg  in  the am, 40mg  in the pm 03/14/13  Yes Scott T Kathlen Mody, PA-C  HYDROcodone-acetaminophen (VICODIN) 5-500 MG per tablet Take 1 tablet by mouth every 8 (eight) hours as needed for pain.  09/16/11  Yes Historical Provider, MD  lisinopril (PRINIVIL,ZESTRIL) 5 MG tablet take 1 tablet by mouth once daily 03/14/13  Yes Scott T Weaver, PA-C  metolazone (ZAROXOLYN) 5 MG tablet Take 5 mg by mouth as needed. Take with morning dose of lasix 12/10/11  Yes Scott T Kathlen Mody, PA-C  traMADol (ULTRAM) 50 MG tablet Take 50 mg by mouth every 8 (eight) hours as needed for pain. For pain   Yes Historical Provider, MD  warfarin (COUMADIN) 2.5 MG tablet of 1 tablet daily or as directed by coumadin clinic 05/24/13  Yes  Minus Breeding, MD    Physical Exam: Filed Vitals:   06/03/13 1330 06/03/13 1345 06/03/13 1400 06/03/13 1415  BP: 115/61 129/75 137/79 117/64  Pulse:      Temp:      TempSrc:      Resp:    24  Height:      Weight:      SpO2:        Physical Exam  Constitutional: Appears well-developed and well-nourished. No distress.  HENT: Normocephalic. External right and left ear normal. Dry M  Eyes: Conjunctivae and EOM are normal. PERRLA, no scleral icterus.  Neck: Normal ROM. Neck supple. No JVD. No tracheal deviation. No thyromegaly.  CVS: RRR, S1/S2 +, no murmurs, no gallops, no carotid bruit.  Pulmonary: Effort and breath sounds normal, no stridor, rhonchi, wheezes, rales.  Abdominal: Soft. BS +,  no distension, tenderness in epigastric area, no rebound Musculoskeletal: Normal range of motion. No edema and no tenderness.  Lymphadenopathy: No lymphadenopathy noted, cervical, inguinal. Neuro: Alert. Normal reflexes, muscle tone coordination. No cranial nerve deficit. Skin: Skin is warm and dry. No rash noted. Not diaphoretic. No erythema. No pallor.  Psychiatric: Normal mood and affect. Behavior, judgment, thought content normal.   Labs on Admission:  Basic Metabolic Panel:  Recent Labs Lab 06/03/13 1135  NA 138  K 3.4*  CL 96  CO2 24  GLUCOSE 81  BUN 36*  CREATININE 1.31*  CALCIUM 9.5   Liver Function Tests:  Recent Labs Lab 06/03/13 1135  AST 49*  ALT 41*  ALKPHOS 153*  BILITOT 0.6  PROT 7.7  ALBUMIN 3.8    Recent Labs Lab 06/03/13 1135  LIPASE 9*   CBC:  Recent Labs Lab 06/03/13 1135  WBC 7.8  NEUTROABS 6.2  HGB 9.9*  HCT 30.6*  MCV 95.0  PLT 317   Cardiac Enzymes:  Recent Labs Lab 06/03/13 1135  TROPONINI <0.30   BNP: No components found with this basename: POCBNP,  CBG: No results found for this basename: GLUCAP,  in the last 168 hours  EKG: Normal sinus rhythm, no ST/T wave changes  Theodis Blaze, MD  [facility] Pager  [phone]  If 7PM-7AM, please contact night-coverage [email or web address] Password [facility] 06/03/2013, 4:04 PM

## 2013-06-03 NOTE — ED Notes (Signed)
IV attempted. IV team called

## 2013-06-03 NOTE — ED Notes (Signed)
IV team returned page, coming to attempt IV.

## 2013-06-03 NOTE — ED Notes (Signed)
Pt presents to ED via [facility] from home with complaints of generalized weakness, N/V since Thursday.

## 2013-06-03 NOTE — Progress Notes (Signed)
Pt transferred to [facility] 13 bedside report for respiratory distress. See assessment, No respiratory distress noted. Pt 100% on 2l n/c O2. Pt repeat glucose 60, alert and oriented pt given orange juice to drink repeat glucose 117. Pt syymptomic

## 2013-06-03 NOTE — Progress Notes (Signed)
Received call from nurse in charge, pt lethargic with oxygen saturation in 80's on RA, difficult to arouse, very sudden change in mental status compared to the initial assessment. Worrisome for intracranial bleed given elevated INR. Called PCCM for further assistance. Will transfer to ICU now. Will ask for CXR, CT head stat, ABG.   Faye Ramsay, MD  [facility] Pager [phone]  If 7PM-7AM, please contact night-coverage [email or web address] Password [email or web address]

## 2013-06-04 LAB — HEMOGLOBIN A1C
Hgb A1c MFr Bld: 5.6 % (ref <5.7)
Mean Plasma Glucose: 114 mg/dL (ref <117)

## 2013-06-04 LAB — CBC
HCT: 27.5 % — ABNORMAL LOW (ref 36.0–46.0)
HEMATOCRIT: 27.8 % — AB (ref 36.0–46.0)
HEMOGLOBIN: 8.6 g/dL — AB (ref 12.0–15.0)
Hemoglobin: 8.8 g/dL — ABNORMAL LOW (ref 12.0–15.0)
MCH: 30.6 pg (ref 26.0–34.0)
MCH: 30.4 pg (ref 26.0–34.0)
MCHC: 31.7 g/dL (ref 30.0–36.0)
MCHC: 31.3 g/dL (ref 30.0–36.0)
MCV: 96.5 fL (ref 78.0–100.0)
MCV: 97.2 fL (ref 78.0–100.0)
PLATELETS: 286 10*3/uL (ref 150–400)
Platelets: 280 10*3/uL (ref 150–400)
RBC: 2.88 MIL/uL — AB (ref 3.87–5.11)
RBC: 2.83 MIL/uL — ABNORMAL LOW (ref 3.87–5.11)
RDW: 14.5 % (ref 11.5–15.5)
RDW: 14.4 % (ref 11.5–15.5)
WBC: 6.5 10*3/uL (ref 4.0–10.5)
WBC: 6.9 10*3/uL (ref 4.0–10.5)

## 2013-06-04 LAB — TROPONIN I

## 2013-06-04 LAB — RETICULOCYTES
RBC.: 3.2 MIL/uL — ABNORMAL LOW (ref 3.87–5.11)
Retic Count, Absolute: 83.2 10*3/uL (ref 19.0–186.0)
Retic Ct Pct: 2.6 % (ref 0.4–3.1)

## 2013-06-04 LAB — FERRITIN: Ferritin: 290 ng/mL (ref 10–291)

## 2013-06-04 LAB — GLUCOSE, CAPILLARY
Glucose-Capillary: 78 mg/dL (ref 70–99)
Glucose-Capillary: 72 mg/dL (ref 70–99)
Glucose-Capillary: 73 mg/dL (ref 70–99)
Glucose-Capillary: 73 mg/dL (ref 70–99)
Glucose-Capillary: 123 mg/dL — ABNORMAL HIGH (ref 70–99)
Glucose-Capillary: 118 mg/dL — ABNORMAL HIGH (ref 70–99)

## 2013-06-04 LAB — URINE CULTURE
CULTURE: NO GROWTH
Colony Count: NO GROWTH

## 2013-06-04 LAB — PROTIME-INR
INR: 1.3 (ref 0.00–1.49)
Prothrombin Time: 15.9 seconds — ABNORMAL HIGH (ref 11.6–15.2)

## 2013-06-04 LAB — BASIC METABOLIC PANEL
BUN: 36 mg/dL — ABNORMAL HIGH (ref 6–23)
CHLORIDE: 94 meq/L — AB (ref 96–112)
CO2: 25 mEq/L (ref 19–32)
CREATININE: 1.32 mg/dL — AB (ref 0.50–1.10)
Calcium: 8 mg/dL — ABNORMAL LOW (ref 8.4–10.5)
GFR calc Af Amer: 44 mL/min — ABNORMAL LOW (ref >90)
GFR calc non Af Amer: 38 mL/min — ABNORMAL LOW (ref >90)
Glucose, Bld: 82 mg/dL (ref 70–99)
Potassium: 4.1 mEq/L (ref 3.7–5.3)
SODIUM: 133 meq/L — AB (ref 137–147)

## 2013-06-04 LAB — VITAMIN B12: VITAMIN B 12: 391 pg/mL (ref 211–911)

## 2013-06-04 LAB — AMMONIA: Ammonia: 65 umol/L — ABNORMAL HIGH (ref 11–60)

## 2013-06-04 MED ORDER — TRAMADOL HCL 50 MG PO TABS
50.0000 mg | ORAL_TABLET | Freq: Four times a day (QID) | ORAL | Status: DC | PRN
  Administered 2013-06-04 – 2013-06-07 (×8): 50 mg via ORAL
  Filled 2013-06-04 (×8): qty 1

## 2013-06-04 MED ORDER — DEXTROSE 50 % IV SOLN
25.0000 mL | Freq: Once | INTRAVENOUS | Status: AC
  Administered 2013-06-04: 25 mL via INTRAVENOUS
  Filled 2013-06-04: qty 50

## 2013-06-04 MED ORDER — SODIUM CHLORIDE 0.9 % IV BOLUS (SEPSIS)
500.0000 mL | Freq: Once | INTRAVENOUS | Status: AC
  Administered 2013-06-04: 500 mL via INTRAVENOUS

## 2013-06-04 MED ORDER — CARVEDILOL 3.125 MG PO TABS
3.1250 mg | ORAL_TABLET | Freq: Two times a day (BID) | ORAL | Status: DC
  Administered 2013-06-05 – 2013-06-06 (×4): 3.125 mg via ORAL
  Filled 2013-06-04 (×6): qty 1

## 2013-06-04 MED ORDER — ACETAMINOPHEN 325 MG PO TABS
650.0000 mg | ORAL_TABLET | Freq: Four times a day (QID) | ORAL | Status: DC | PRN
  Administered 2013-06-04 – 2013-06-06 (×4): 650 mg via ORAL
  Filled 2013-06-04 (×4): qty 2

## 2013-06-04 MED ORDER — DEXTROSE-NACL 5-0.9 % IV SOLN
INTRAVENOUS | Status: DC
  Administered 2013-06-04 – 2013-06-05 (×4): via INTRAVENOUS
  Administered 2013-06-06: 125 mL/h via INTRAVENOUS
  Administered 2013-06-06: 1000 mL via INTRAVENOUS

## 2013-06-04 NOTE — Progress Notes (Signed)
[place] TEAM 1 - Stepdown/ICU TEAM Progress Note  [name] OFH:[id number] DOB: Sep 12, 1934 DOA: 06/03/2013 PCP: Osborne Casco, MD  Admit HPI / Brief Narrative: 79 year old female with past medical history of obesity, hypertension, CHF, and A. Fib, who presented to [facility] ED with main concern of sudden onset of generalized abdominal pain described as constant and throbbing, 5/10 in severity, associated with N/V, and poor oral intake. This started 4 days prior to this admission but had gotten much worse. She denied fevers, chills, chest pain, shortness of breath, no specific urinary concerns, no blood in stool or urine, no focal neurological symptoms.   In ED, pt noted to be hemodynamically stable, CT abdomen unremarkable for acute findings, INR noted to be > 10.   HPI/Subjective: Pt is lethargic again this morning, but is arousable.  She c/o ongoing nausea, but no vomiting.  No chest pain or sob.  Feels achey all over.    Assessment/Plan:  Lethargy / Altered mental status  Mental status has waxed/wained since admit - no clear etiology at this time - ABG unrevealing - CT head unrevealing - mild hypoglycemia not consistently linked to events - repeat UA was unrevealing, but initial was suggestive of possible UTI - TSH normal   UTI? Initial UA suggestive of such but f/u UA not revealing - await urine culture results   Hypoglycemia  Mild/moderate w/ low of 60 thus far - not likely low enough to explain altered mentation, and had not occurred at each lethargic spell - A1c 5.6  Moderate hypotension Appears volume depleted - hydrate and follow  Acute anemia in setting of Anemia of chronic disease Baseline Hgb appears to be 11 (Feb 2015) - Hgb 9.9 at presentation and dropping with hydration - given INR >10 suspicious of occult blood loss - CT of abdom/pelvis at admit revealed no evidence of RPH - follow in serial fashion   Acute renal injury crt 1.31 at presentation - baseline from  March 2015 is 1.0 - hydrate and follow   Acute hypoxic respiratory failure  Resolved - CXR unrevealing - likely simply hypoventilation   Abdominal pain with N/V CT abd with no specific abnormality noted  Chronic Afib Rate controlled   Supra therapeutic INR Dosed w/ vitamin K - recheck INR this morning   Hx of Chronic systolic and diastolic CHF TTE 06/4980: EF 50-55%, normal wall motion, moderate MR, mild to moderate LAE, PASP 37   Hx of HTN   Mild Transaminitis Transaminases normal March 2015  Elevated alk phos outpt w/u has suggested non-liver source   Hypokalemia Resolved - follow - Mg is ok  Obesity - Body mass index is 43.47 kg/(m^2).  Code Status: FULL Family Communication: spoke w/ pt and son at bedside  Disposition Plan: transfer to SDU  Consultants: none  Procedures: none  Antibiotics: none  DVT prophylaxis: warfarin  Objective: Blood pressure 94/37, pulse 59, temperature 97.8 F (36.6 C), temperature source Oral, resp. rate 20, height $RemoveBe'5\' 1"'YeHDunwGl$  (1.549 m), weight 104.3 kg (229 lb 15 oz), SpO2 100.00%.  Intake/Output Summary (Last 24 hours) at 06/04/13 6415 Last data filed at 06/04/13 0800  Gross per 24 hour  Intake 1250.67 ml  Output    300 ml  Net 950.67 ml   Exam: General: No acute respiratory distress - lethargic - yells out in pain when touched anywhere Lungs: Clear to auscultation bilaterally without wheezes or crackles - distant BS  Cardiovascular: distant HS - regular rate - irregular rhythm - no  appreciable M Abdomen: Obese, nondistended, soft, bowel sounds positive, no rebound, no ascites, no appreciable mass Extremities: No significant cyanosis, clubbing, or edema bilateral lower extremities  Data Reviewed: Basic Metabolic Panel:  Recent Labs Lab 06/03/13 1135 06/03/13 1825 06/04/13 0333  NA 138  --  133*  K 3.4*  --  4.1  CL 96  --  94*  CO2 24  --  25  GLUCOSE 81  --  82  BUN 36*  --  36*  CREATININE 1.31*  --  1.32*    CALCIUM 9.5  --  8.0*  MG  --  2.6*  --   PHOS  --  3.2  --    Liver Function Tests:  Recent Labs Lab 06/03/13 1135  AST 49*  ALT 41*  ALKPHOS 153*  BILITOT 0.6  PROT 7.7  ALBUMIN 3.8    Recent Labs Lab 06/03/13 1135  LIPASE 9*   No results found for this basename: AMMONIA,  in the last 168 hours CBC:  Recent Labs Lab 06/03/13 1135 06/04/13 0333  WBC 7.8 6.5  NEUTROABS 6.2  --   HGB 9.9* 8.8*  HCT 30.6* 27.8*  MCV 95.0 96.5  PLT 317 286   Cardiac Enzymes:  Recent Labs Lab 06/03/13 1135 06/03/13 1825 06/03/13 2335 06/04/13 0333  TROPONINI <0.30 <0.30 <0.30 <0.30   BNP (last 3 results)  Recent Labs  06/27/12 1129 07/13/12 1121 06/03/13 1825  PROBNP 4914.0* 106.0* 27115.0*   CBG:  Recent Labs Lab 06/04/13 0119 06/04/13 0233 06/04/13 0407 06/04/13 0605 06/04/13 0731  GLUCAP 78 72 73 73 123*    Recent Results (from the past 240 hour(s))  MRSA PCR SCREENING     Status: None   Collection Time    06/03/13  6:18 PM      Result Value Ref Range Status   MRSA by PCR NEGATIVE  NEGATIVE Final   Comment:            The GeneXpert MRSA Assay (FDA     approved for NASAL specimens     only), is one component of a     comprehensive MRSA colonization     surveillance program. It is not     intended to diagnose MRSA     infection nor to guide or     monitor treatment for     MRSA infections.     Studies:  Recent x-ray studies have been reviewed in detail by the Attending Physician  Scheduled Meds:  Scheduled Meds: . bisacodyl  5 mg Oral Daily  . carvedilol  6.25 mg Oral BID WC  . ferrous fumarate  1 tablet Oral BID  . insulin aspart  0-9 Units Subcutaneous TID WC  . metolazone  5 mg Oral Daily    Time spent on care of this patient: 35 mins   Cherene Altes , MD   [facility] Office  [phone] Pager - Text Page per Amion as per below:  On-Call/Text Page:      [email or web address]      password [email or web address]  If 7PM-7AM, please  contact night-coverage [email or web address] Password [email or web address] 06/04/2013, 8:21 AM   LOS: 1 day

## 2013-06-04 NOTE — Care Management Note (Addendum)
  Page 1 of 1   06/07/2013     3:30:13 PM CARE MANAGEMENT NOTE 06/07/2013  Patient:  [name], [name]   Account Number:  [id number]  Date Initiated:  06/04/2013  Documentation initiated by:  Elissa Hefty  Subjective/Objective Assessment:   adm w dehydration     Action/Plan:   lives w fam, pcp dr Margaretha Sheffield griffin   Anticipated DC Date:  06/07/2013   Anticipated DC Plan:  [place]  In-house referral  Clinical Social Worker      DC Planning Services  CM consult      Choice offered to / List presented to:             Status of service:  Completed, signed off Medicare Important Message given?   (If response is "NO", the following Medicare IM given date fields will be blank) Date Medicare IM given:   Date Additional Medicare IM given:    Discharge Disposition:  [place]  Per UR Regulation:  Reviewed for med. necessity/level of care/duration of stay  If discussed at [place] of Stay Meetings, dates discussed:    Comments:  Mikhael Hendriks RN, BSN, MSHL, CCM  Nurse - Case Manager, ([facility])  [phone]  06/07/2013 Transfer from [place] received to [facility]. Initial IM not administered on admission. CM provided Verbal Education to patient, however, patient refused to sign stating she prefers her DTR to sign the form. DTR/Cherly not available at this time. Dispositon Plan:  SNF - SW active.

## 2013-06-05 DIAGNOSIS — G8929 Other chronic pain: Secondary | ICD-10-CM

## 2013-06-05 DIAGNOSIS — I4891 Unspecified atrial fibrillation: Secondary | ICD-10-CM

## 2013-06-05 DIAGNOSIS — N179 Acute kidney failure, unspecified: Principal | ICD-10-CM

## 2013-06-05 DIAGNOSIS — I10 Essential (primary) hypertension: Secondary | ICD-10-CM

## 2013-06-05 DIAGNOSIS — E871 Hypo-osmolality and hyponatremia: Secondary | ICD-10-CM

## 2013-06-05 DIAGNOSIS — D689 Coagulation defect, unspecified: Secondary | ICD-10-CM

## 2013-06-05 DIAGNOSIS — E876 Hypokalemia: Secondary | ICD-10-CM

## 2013-06-05 DIAGNOSIS — I509 Heart failure, unspecified: Secondary | ICD-10-CM

## 2013-06-05 DIAGNOSIS — I272 Pulmonary hypertension, unspecified: Secondary | ICD-10-CM | POA: Diagnosis present

## 2013-06-05 DIAGNOSIS — I5030 Unspecified diastolic (congestive) heart failure: Secondary | ICD-10-CM

## 2013-06-05 DIAGNOSIS — Z7901 Long term (current) use of anticoagulants: Secondary | ICD-10-CM

## 2013-06-05 DIAGNOSIS — I2789 Other specified pulmonary heart diseases: Secondary | ICD-10-CM

## 2013-06-05 LAB — CBC
HCT: 27.8 % — ABNORMAL LOW (ref 36.0–46.0)
Hemoglobin: 8.7 g/dL — ABNORMAL LOW (ref 12.0–15.0)
MCH: 30.4 pg (ref 26.0–34.0)
MCHC: 31.3 g/dL (ref 30.0–36.0)
MCV: 97.2 fL (ref 78.0–100.0)
PLATELETS: 280 10*3/uL (ref 150–400)
RBC: 2.86 MIL/uL — ABNORMAL LOW (ref 3.87–5.11)
RDW: 14.4 % (ref 11.5–15.5)
WBC: 6.8 10*3/uL (ref 4.0–10.5)

## 2013-06-05 LAB — COMPREHENSIVE METABOLIC PANEL
ALBUMIN: 3 g/dL — AB (ref 3.5–5.2)
ALK PHOS: 109 U/L (ref 39–117)
ALT: 29 U/L (ref 0–35)
AST: 15 U/L (ref 0–37)
BILIRUBIN TOTAL: 0.5 mg/dL (ref 0.3–1.2)
BUN: 35 mg/dL — ABNORMAL HIGH (ref 6–23)
CHLORIDE: 93 meq/L — AB (ref 96–112)
CO2: 24 mEq/L (ref 19–32)
CREATININE: 1.74 mg/dL — AB (ref 0.50–1.10)
Calcium: 7.4 mg/dL — ABNORMAL LOW (ref 8.4–10.5)
GFR, EST AFRICAN AMERICAN: 32 mL/min — AB (ref >90)
GFR, EST NON AFRICAN AMERICAN: 27 mL/min — AB (ref >90)
GLUCOSE: 103 mg/dL — AB (ref 70–99)
Potassium: 3.5 mEq/L — ABNORMAL LOW (ref 3.7–5.3)
Sodium: 130 mEq/L — ABNORMAL LOW (ref 137–147)
Total Protein: 6.1 g/dL (ref 6.0–8.3)

## 2013-06-05 LAB — GLUCOSE, CAPILLARY
GLUCOSE-CAPILLARY: 111 mg/dL — AB (ref 70–99)
Glucose-Capillary: 112 mg/dL — ABNORMAL HIGH (ref 70–99)
Glucose-Capillary: 106 mg/dL — ABNORMAL HIGH (ref 70–99)
Glucose-Capillary: 106 mg/dL — ABNORMAL HIGH (ref 70–99)

## 2013-06-05 LAB — PROTIME-INR
INR: 1.45 (ref 0.00–1.49)
Prothrombin Time: 17.3 seconds — ABNORMAL HIGH (ref 11.6–15.2)

## 2013-06-05 LAB — CORTISOL: Cortisol, Plasma: 16.4 ug/dL

## 2013-06-05 LAB — POTASSIUM: POTASSIUM: 4.1 meq/L (ref 3.7–5.3)

## 2013-06-05 LAB — MAGNESIUM: Magnesium: 2 mg/dL (ref 1.5–2.5)

## 2013-06-05 MED ORDER — HEPARIN (PORCINE) IN NACL 100-0.45 UNIT/ML-% IJ SOLN
1500.0000 [IU]/h | INTRAMUSCULAR | Status: DC
  Administered 2013-06-05: 1100 [IU]/h via INTRAVENOUS
  Filled 2013-06-05 (×2): qty 250

## 2013-06-05 MED ORDER — WARFARIN - PHARMACIST DOSING INPATIENT: Freq: Every day | Status: DC

## 2013-06-05 MED ORDER — POTASSIUM CHLORIDE CRYS ER 20 MEQ PO TBCR
30.0000 meq | EXTENDED_RELEASE_TABLET | Freq: Once | ORAL | Status: AC
  Administered 2013-06-05: 30 meq via ORAL
  Filled 2013-06-05 (×2): qty 1

## 2013-06-05 MED ORDER — WARFARIN SODIUM 1 MG PO TABS
1.0000 mg | ORAL_TABLET | Freq: Once | ORAL | Status: AC
  Administered 2013-06-05: 1 mg via ORAL
  Filled 2013-06-05: qty 1

## 2013-06-05 NOTE — Progress Notes (Signed)
Dr Sherral Hammers at bedside assessing Pt re persistent generalized pain .

## 2013-06-05 NOTE — Progress Notes (Addendum)
ANTICOAGULATION CONSULT NOTE - Initial Consult  Pharmacy Consult for coumadin and heparin Indication: atrial fibrillation  Allergies  Allergen Reactions  . Penicillins Other (See Comments)    unknown  . Tetanus-Diphtheria Toxoids Td Other (See Comments)    unknown  . Latex Hives and Rash  . Levaquin [Levofloxacin In D5w] Rash    Patient Measurements: Height: 5\' 1"  (154.9 cm) Weight: 238 lb 12.1 oz (108.3 kg) IBW/kg (Calculated) : 47.8 Heparin Dosing Weight: 74 kg  Vital Signs: Temp: 98.6 F (37 C) (04/28 1600) Temp src: Oral (04/28 1600) BP: 120/69 mmHg (04/28 1600) Pulse Rate: 80 (04/28 1700)  Labs:  Recent Labs  06/03/13 1135 06/03/13 1825 06/03/13 2335 06/04/13 0333 06/04/13 1120 06/04/13 1945 06/05/13 0242  HGB 9.9*  --   --  8.8*  --  8.6* 8.7*  HCT 30.6*  --   --  27.8*  --  27.5* 27.8*  PLT 317  --   --  286  --  280 280  LABPROT 75.9*  --   --   --  15.9*  --   --   INR 10.09*  --   --   --  1.30  --   --   CREATININE 1.31*  --   --  1.32*  --   --  1.74*  TROPONINI <0.30 <0.30 <0.30 <0.30  --   --   --     Estimated Creatinine Clearance: 31.3 ml/min (by C-G formula based on Cr of 1.74).   Medical History: Past Medical History  Diagnosis Date  . HTN (hypertension)   . Diverticulosis   . Hemorrhoid   . Osteoarthritis   . Constipation, chronic   . H/O: hysterectomy   . Systolic and diastolic CHF, chronic     a. Echo 07/2011: Technically limited; inferior HK, inferolateral and possibly lateral wall HK, EF 40%, moderate LVE, aortic sclerosis without stenosis, mild MR, mild LAE, question RV dysfunction;   b.  Echo (04/2013): EF 50-55%, normal wall motion, moderate MR, mild to moderate LAE, PASP 37  . Female bladder prolapse     with prolapse uterus, cystocele, s/p TVH/BSO 12/2006  . Obesity (BMI 30-39.9)     wt. 94.1 kg 12/2010  . Moderate mitral regurgitation     a. Mod to Sev by echo 01/2009;  b. mild MR by echo 2013  . Atrial fibrillation  10/13/2011    coumadin Rx  . Iron deficiency anemia     Medications:  Scheduled:  . bisacodyl  5 mg Oral Daily  . carvedilol  3.125 mg Oral BID WC  . ferrous fumarate  1 tablet Oral BID   Infusions:  . dextrose 5 % and 0.9% NaCl 125 mL/hr at 06/05/13 1530    Assessment: 79 yo female with a history of afib will be resumed on coumadin therapy bridging with heparin.  Patient was on coumadin prior to admission (~2.5mg  daily) and her admitting INR was > 10.  Vitamin K was given to reverse the INR.  INR on 06/04/13 was 1.3.  Baseline Hgb is 8.7 and Plt 280 K on 06/05/13.  CrCl ~31  Goal of Therapy:  INR 2-3 Monitor platelets by anticoagulation protocol: Yes   Plan:  1) Coumadin 1 mg po x1 2) Daily PT/INR 3) Start heparin drip at 1100 units/hr.  4) Check an 8hr heparin level after drip is started. 5) Daily heparin level and CBC  Tsz-Yin Keontay Vora 06/05/2013,8:20 PM

## 2013-06-05 NOTE — Progress Notes (Signed)
[place] TEAM 1 - Stepdown/ICU TEAM Progress Note  [name] UMP:[id number] DOB: [birth year] DOA: 06/03/2013 PCP: Osborne Casco, MD  Admit HPI / Brief Narrative: 79 year old female with past medical history of obesity, hypertension, CHF, A. Fib, presented to [facility] ED with main concern of sudden onset of generalized abdominal pain constant and throbbing, 5/10 in severity, associated with N/V, poor oral intake. This initially started 4 days prior to this admission but has gotten much worse. She denies fevers, chills, chest pain, shortness of breath, no specific urinary concerns, no blood in stool or urine, no focal neurological symptoms.  In ED, pt noted to be hemodynamically stable, CT abdomen unremarkable for acute events, INR noted to be > 10. TRH asked to admit to telemetry bed.   HPI/Subjective:   Assessment/Plan:  Abdominal pain with N/V  -N./V. resolve; chronic abdominal pain most likely multifactorial previous surgery, prolapsed bladder, multiple renal cysts - Chronic abdominal pain unclear etiology at this time, CT abd with no specific abnormality noted  - Right upper quadrant abdominal ultrasound; enlarged gallbladder on CT?  - Continue providing supportive care with IVF, analgesia, antiemetics as needed   Supra therapeutic INR  - possibly related to principal problem  - vit K 5 mg dose given in ED  -4/28 INR subtherapeutic = 1.3    Atrial fibrillation -Coumadin per pharmacy -Heparin per pharmacy to bridge  Diastolic CHF - hold Lasix and place on IVF  -Coreg 3.125 mg BID  Pulmonary hypertension -See diastolic CHF  HTN  - hold Lisinopril due to ARF   ARF  - hold lisinopril and lasix for now  - provide IVF and repeat BMP in AM  Hyponatremia -Review of EHR shows patient to be hyponatremic/borderline hyponatremic for at least a year -Most likely multifactorial, dehydration, CHF   Transaminitis  - will check Alcohol level but etiology is not very clear     Anemia of chronic disease  - pt denies any noticeable bleeding  - close monitoring due to elevated INR   Hypokalemia  - Resolved - repeat BMP in AM -Will also check magnesium   Code Status: FULL Family Communication: no family present at time of exam Disposition Plan: Resolution dehydration    Consultants: N./A  Procedure/Significant Events: CT head without contrast 06/03/2013 -No acute intracranial pathology  - Mild cortical volume loss and diffuse small vessel ischemic microangiopathy.  -Small mucus retention cyst or polyp within the right maxillary sinus.  CT abdomen pelvis w/contrast 06/03/2013 -No focal liver lesion  -gallbladder, spleen, right adrenal gland/Pancreas unremarkable -Mild diffuse thickening of the left adrenal gland.  -Small right lower pole renal cysts measure 1.4 and 1.7 cm, similar to the prior study.  -Multiple left renal cysts also do not changed; largest measuring 3.3 cm -no hydronephrosis.   Echocardiogram 04/27/2013 - Left ventricle: moderately dilated. -LVEF=50% to 55%.  - Mitral valve: Moderate regurgitation. - Left atrium: The atrium was mildly to moderately dilated. - Pulmonary arteries: PA peak pressure: 64mm Hg (S).       Culture 4/26 MRSA by PCR negative 4/26 urine culture negative   Antibiotics: N./A.   DVT prophylaxis: Heparin/Coumadin   Devices    LINES / TUBES:  4/20 20Ga right antecubital 4/20 20Ga right hand    Continuous Infusions: . dextrose 5 % and 0.9% NaCl 125 mL/hr at 06/04/13 1900    Objective: VITAL SIGNS: Temp: 98.6 F (37 C) (04/28 1600) Temp src: Oral (04/28 1600) BP: 120/69 mmHg (04/28 1600) Pulse Rate:  80 (04/28 1700) SPO2; FIO2:   Intake/Output Summary (Last 24 hours) at 06/05/13 1800 Last data filed at 06/05/13 1500  Gross per 24 hour  Intake   3765 ml  Output      1 ml  Net   3764 ml     Exam: General: A./O. x4, No acute respiratory distress Lungs: Clear to  auscultation bilaterally without wheezes or crackles Cardiovascular: Regular rate and rhythm without murmur gallop or rub normal S1 and S2 Renalbalance today;        /overall;        Creatinine ;   1.74     Hourly output   Abdomen: Tender to palpation epigastric, LLQ, nondistended, soft, bowel sounds positive, no rebound, no ascites, no appreciable mass Extremities: No significant cyanosis, clubbing, or edema bilateral lower extremities  Data Reviewed: Basic Metabolic Panel:  Recent Labs Lab 06/03/13 1135 06/03/13 1825 06/04/13 0333 06/05/13 0242 06/05/13 1200  NA 138  --  133* 130*  --   K 3.4*  --  4.1 3.5* 4.1  CL 96  --  94* 93*  --   CO2 24  --  25 24  --   GLUCOSE 81  --  82 103*  --   BUN 36*  --  36* 35*  --   CREATININE 1.31*  --  1.32* 1.74*  --   CALCIUM 9.5  --  8.0* 7.4*  --   MG  --  2.6*  --   --  2.0  PHOS  --  3.2  --   --   --    Liver Function Tests:  Recent Labs Lab 06/03/13 1135 06/05/13 0242  AST 49* 15  ALT 41* 29  ALKPHOS 153* 109  BILITOT 0.6 0.5  PROT 7.7 6.1  ALBUMIN 3.8 3.0*    Recent Labs Lab 06/03/13 1135  LIPASE 9*    Recent Labs Lab 06/04/13 1120  AMMONIA 65*   CBC:  Recent Labs Lab 06/03/13 1135 06/04/13 0333 06/04/13 1945 06/05/13 0242  WBC 7.8 6.5 6.9 6.8  NEUTROABS 6.2  --   --   --   HGB 9.9* 8.8* 8.6* 8.7*  HCT 30.6* 27.8* 27.5* 27.8*  MCV 95.0 96.5 97.2 97.2  PLT 317 286 280 280   Cardiac Enzymes:  Recent Labs Lab 06/03/13 1135 06/03/13 1825 06/03/13 2335 06/04/13 0333  TROPONINI <0.30 <0.30 <0.30 <0.30   BNP (last 3 results)  Recent Labs  06/27/12 1129 07/13/12 1121 06/03/13 1825  PROBNP 4914.0* 106.0* 27115.0*   CBG:  Recent Labs Lab 06/04/13 0731 06/04/13 2242 06/05/13 0738 06/05/13 1154 06/05/13 1717  GLUCAP 123* 118* 111* 112* 106*    Recent Results (from the past 240 hour(s))  MRSA PCR SCREENING     Status: None   Collection Time    06/03/13  6:18 PM      Result Value Ref  Range Status   MRSA by PCR NEGATIVE  NEGATIVE Final   Comment:            The GeneXpert MRSA Assay (FDA     approved for NASAL specimens     only), is one component of a     comprehensive MRSA colonization     surveillance program. It is not     intended to diagnose MRSA     infection nor to guide or     monitor treatment for     MRSA infections.  URINE CULTURE     Status:  None   Collection Time    06/03/13  8:51 PM      Result Value Ref Range Status   Specimen Description URINE, CATHETERIZED   Final   Special Requests NONE   Final   Culture  Setup Time     Final   Value: 06/03/2013 21:33     Performed at [facility]     Final   Value: NO GROWTH     Performed at [facility]   Culture     Final   Value: NO GROWTH     Performed at [facility]   Report Status 06/04/2013 FINAL   Final     Studies:  Recent x-ray studies have been reviewed in detail by the Attending Physician  Scheduled Meds:  Scheduled Meds: . bisacodyl  5 mg Oral Daily  . carvedilol  3.125 mg Oral BID WC  . ferrous fumarate  1 tablet Oral BID    Time spent on care of this patient: 40 mins   Allie Bossier , MD   [facility] Office  [phone] Pager [phone]  On-Call/Text Page:      [email or web address]      password [email or web address]  If 7PM-7AM, please contact night-coverage [email or web address] Password [email or web address] 06/05/2013, 6:00 PM   LOS: 2 days

## 2013-06-06 ENCOUNTER — Inpatient Hospital Stay (HOSPITAL_COMMUNITY): Payer: Medicare Other

## 2013-06-06 LAB — BASIC METABOLIC PANEL
BUN: 29 mg/dL — AB (ref 6–23)
CALCIUM: 7.4 mg/dL — AB (ref 8.4–10.5)
CO2: 22 mEq/L (ref 19–32)
Chloride: 96 mEq/L (ref 96–112)
Creatinine, Ser: 1.45 mg/dL — ABNORMAL HIGH (ref 0.50–1.10)
GFR calc Af Amer: 39 mL/min — ABNORMAL LOW (ref >90)
GFR, EST NON AFRICAN AMERICAN: 34 mL/min — AB (ref >90)
Glucose, Bld: 90 mg/dL (ref 70–99)
Potassium: 3.8 mEq/L (ref 3.7–5.3)
SODIUM: 129 meq/L — AB (ref 137–147)

## 2013-06-06 LAB — GLUCOSE, CAPILLARY
GLUCOSE-CAPILLARY: 92 mg/dL (ref 70–99)
Glucose-Capillary: 153 mg/dL — ABNORMAL HIGH (ref 70–99)
Glucose-Capillary: 92 mg/dL (ref 70–99)
Glucose-Capillary: 119 mg/dL — ABNORMAL HIGH (ref 70–99)

## 2013-06-06 LAB — CBC
HEMATOCRIT: 26.1 % — AB (ref 36.0–46.0)
Hemoglobin: 8.1 g/dL — ABNORMAL LOW (ref 12.0–15.0)
MCH: 30.3 pg (ref 26.0–34.0)
MCHC: 31 g/dL (ref 30.0–36.0)
MCV: 97.8 fL (ref 78.0–100.0)
Platelets: 275 10*3/uL (ref 150–400)
RBC: 2.67 MIL/uL — ABNORMAL LOW (ref 3.87–5.11)
RDW: 14.3 % (ref 11.5–15.5)
WBC: 6.2 10*3/uL (ref 4.0–10.5)

## 2013-06-06 LAB — IRON AND TIBC
Iron: 69 ug/dL (ref 42–135)
Saturation Ratios: 35 % (ref 20–55)
TIBC: 198 ug/dL — AB (ref 250–470)
UIBC: 129 ug/dL (ref 125–400)

## 2013-06-06 LAB — HEPARIN LEVEL (UNFRACTIONATED)

## 2013-06-06 LAB — PROTIME-INR
INR: 1.48 (ref 0.00–1.49)
PROTHROMBIN TIME: 17.5 s — AB (ref 11.6–15.2)

## 2013-06-06 LAB — RPR

## 2013-06-06 LAB — MAGNESIUM: Magnesium: 1.9 mg/dL (ref 1.5–2.5)

## 2013-06-06 LAB — FOLATE RBC: RBC FOLATE: 509 ng/mL (ref >280)

## 2013-06-06 MED ORDER — ENOXAPARIN SODIUM 120 MG/0.8ML ~~LOC~~ SOLN
1.0000 mg/kg | Freq: Two times a day (BID) | SUBCUTANEOUS | Status: DC
  Administered 2013-06-06 (×2): 110 mg via SUBCUTANEOUS
  Filled 2013-06-06 (×4): qty 0.8

## 2013-06-06 MED ORDER — WARFARIN SODIUM 1 MG PO TABS
1.0000 mg | ORAL_TABLET | Freq: Once | ORAL | Status: AC
  Administered 2013-06-06: 1 mg via ORAL
  Filled 2013-06-06: qty 1

## 2013-06-06 NOTE — Evaluation (Signed)
Physical Therapy Evaluation Patient Details Name: [name] MRN: [id number] DOB: 10-Jul-1934 Today's Date: 06/06/2013   History of Present Illness  79 year old female with past medical history of obesity, hypertension, CHF, and A. Fib, who presented to [facility] ED with main concern of sudden onset of generalized abdominal pain described as constant and throbbing, 5/10 in severity, associated with N/V, and poor oral intake. This started 4 days prior to this admission but had gotten much worse.  Pt states during this time she has not been able to move well.  Evaluation of illness is ongoing.  Clinical Impression  Pt admitted with/for abdominal pain and dehydration.  Now pt is profoundly weak and painful.  Pt currently limited functionally due to the problems listed. ( See problems list.)   Pt will benefit from PT to maximize function and safety in order to get ready for next venue listed below.     Follow Up Recommendations SNF;Supervision/Assistance - 24 hour    Equipment Recommendations  None recommended by PT    Recommendations for Other Services       Precautions / Restrictions Precautions Precautions: Fall      Mobility  Bed Mobility Overal bed mobility: Needs Assistance Bed Mobility: Supine to Sit     Supine to sit: Mod assist     General bed mobility comments: pt had difficulty scooting to edge.  truncal assist needed to get up/  Transfers Overall transfer level: Needs assistance   Transfers: Sit to/from Stand Sit to Stand: Min assist;Mod assist (mod as she fatigued)         General transfer comment: cues for hand placement and safety; assist to come forward  Ambulation/Gait                Stairs            Wheelchair Mobility    Modified Rankin (Stroke Patients Only)       Balance Overall balance assessment: Needs assistance Sitting-balance support: Feet supported;No upper extremity supported Sitting balance-Leahy Scale: Fair     Standing  balance support: During functional activity;Bilateral upper extremity supported Standing balance-Leahy Scale: Poor Standing balance comment: stood very well at EOB for pericare.  approx. 2 min using RW to lean forward.                             Pertinent Vitals/Pain     Home Living Family/patient expects to be discharged to:: Private residence Living Arrangements: Alone Available Help at Discharge: Family;Available PRN/intermittently;Friend(s) Type of Home: Apartment Home Access: Stairs to enter Entrance Stairs-Rails:  (?rails) Entrance Stairs-Number of Steps: 2 Home Layout: One level Home Equipment: Walker - 2 wheels;Tub bench;Other (comment) (riser for toilet) Additional Comments: pt's daughter runs errands and gets groceries.  Pt goes out rarely    Prior Function Level of Independence: Independent with assistive device(s)         Comments: Mostly takes sponge baths     Hand Dominance        Extremity/Trunk Assessment   Upper Extremity Assessment: Generalized weakness (and painful)           Lower Extremity Assessment: Generalized weakness (and painful  grossly 3/5)         Communication   Communication: No difficulties  Cognition Arousal/Alertness: Awake/alert Behavior During Therapy: WFL for tasks assessed/performed Overall Cognitive Status: Within Functional Limits for tasks assessed  General Comments      Exercises        Assessment/Plan    PT Assessment Patient needs continued PT services  PT Diagnosis Difficulty walking;Generalized weakness   PT Problem List Decreased strength;Decreased activity tolerance;Decreased balance;Decreased mobility;Decreased knowledge of use of DME;Decreased safety awareness;Cardiopulmonary status limiting activity;Obesity;Pain  PT Treatment Interventions Gait training;Functional mobility training;Therapeutic activities;Stair training;Balance training;Patient/family  education   PT Goals (Current goals can be found in the Care Plan section) Acute Rehab PT Goals Patient Stated Goal: Get back home and do for myself at home PT Goal Formulation: With patient Time For Goal Achievement: 06/06/13 Potential to Achieve Goals: Good    Frequency Min 3X/week   Barriers to discharge        Co-evaluation               End of Session Equipment Utilized During Treatment: Oxygen Activity Tolerance: Patient tolerated treatment well;Patient limited by fatigue Patient left: Other (comment);with call bell/phone within reach (sitting EOB eating supper) Nurse Communication: Mobility status         Time: 8295-6213 PT Time Calculation (min): 27 min   Charges:   PT Evaluation $Initial PT Evaluation Tier I: 1 Procedure PT Treatments $Therapeutic Activity: 8-22 mins   PT G CodesTessie Fass [name] 06/06/2013, 5:46 PM 06/06/2013  [name], [place] [phone]  (pager)

## 2013-06-06 NOTE — Progress Notes (Signed)
[place] for coumadin and heparin Indication: atrial fibrillation  Allergies  Allergen Reactions  . Penicillins Other (See Comments)    unknown  . Tetanus-Diphtheria Toxoids Td Other (See Comments)    unknown  . Latex Hives and Rash  . Levaquin [Levofloxacin In D5w] Rash    Patient Measurements: Height: 5\' 1"  (154.9 cm) Weight: 246 lb 0.5 oz (111.6 kg) IBW/kg (Calculated) : 47.8 Heparin Dosing Weight: 74 kg  Vital Signs: Temp: 98.3 F (36.8 C) (04/29 0400) Temp src: Oral (04/29 0400) BP: 122/67 mmHg (04/29 0600) Pulse Rate: 62 (04/29 0600)  Labs:  Recent Labs  06/03/13 1825 06/03/13 2335 06/04/13 0333 06/04/13 1120 06/04/13 1945 06/05/13 0242 06/05/13 2110 06/06/13 0233  HGB  --   --  8.8*  --  8.6* 8.7*  --  8.1*  HCT  --   --  27.8*  --  27.5* 27.8*  --  26.1*  PLT  --   --  286  --  280 280  --  275  LABPROT  --   --   --  15.9*  --   --  17.3* 17.5*  INR  --   --   --  1.30  --   --  1.45 1.48  HEPARINUNFRC  --   --   --   --   --   --   --  <0.10*  CREATININE  --   --  1.32*  --   --  1.74*  --  1.45*  TROPONINI <0.30 <0.30 <0.30  --   --   --   --   --     Estimated Creatinine Clearance: 38.2 ml/min (by C-G formula based on Cr of 1.45).   Medical History: Past Medical History  Diagnosis Date  . HTN (hypertension)   . Diverticulosis   . Hemorrhoid   . Osteoarthritis   . Constipation, chronic   . H/O: hysterectomy   . Systolic and diastolic CHF, chronic     a. Echo 07/2011: Technically limited; inferior HK, inferolateral and possibly lateral wall HK, EF 40%, moderate LVE, aortic sclerosis without stenosis, mild MR, mild LAE, question RV dysfunction;   b.  Echo (04/2013): EF 50-55%, normal wall motion, moderate MR, mild to moderate LAE, PASP 37  . Female bladder prolapse     with prolapse uterus, cystocele, s/p TVH/BSO 12/2006  . Obesity (BMI 30-39.9)     wt. 94.1 kg 12/2010  . Moderate mitral regurgitation      a. Mod to Sev by echo 01/2009;  b. mild MR by echo 2013  . Atrial fibrillation 10/13/2011    coumadin Rx  . Iron deficiency anemia     Medications:  Scheduled:  . bisacodyl  5 mg Oral Daily  . carvedilol  3.125 mg Oral BID WC  . ferrous fumarate  1 tablet Oral BID  . Warfarin - Pharmacist Dosing Inpatient   Does not apply q1800   Infusions:  . dextrose 5 % and 0.9% NaCl 125 mL/hr at 06/05/13 2000  . heparin 1,500 Units/hr (06/06/13 0446)    Assessment: 79 yo female with a history of afib resumed on coumadin therapy bridging with heparin 4/28.  Patient was on coumadin prior to admission (~2.5mg  daily) and her admitting INR was > 10.  Vitamin K was given to reverse the INR.  INR today is 1.48 (trending up), Hgb down to 8.1, pltc stable. No bleeding issues noted.  Orders this morning  to transition patient from heparin to lovenox. Renal function currently appropriate for bid dosing, will follow scr close for adjustments  Goal of Therapy:  INR 2-3 Heparin level goal 0.3-0.7 Monitor platelets by anticoagulation protocol: Yes   Plan:  1) Repeat Coumadin 1 mg po x1 2) Daily PT/INR 3) Turn heparin off now then will start lovenox in an hour 4) Next cbc 5/1  Erin Hearing PharmD., BCPS Clinical Pharmacist Pager [phone] 06/06/2013 8:40 AM

## 2013-06-06 NOTE — Progress Notes (Signed)
Report called to receiving RN [id number]. Will transfer in bed with RN. IV therapy also notified for IV restart.

## 2013-06-06 NOTE — Progress Notes (Signed)
[place] TEAM 1 - Stepdown/ICU TEAM Progress Note  PHOENYX MELKA MAU:[id number] DOB: 1934/05/01 DOA: 06/03/2013 PCP: Osborne Casco, MD  Admit HPI / Brief Narrative: 79 year old female with past medical history of obesity, hypertension, CHF, and A. Fib, who presented to [facility] ED with main concern of sudden onset of generalized abdominal pain described as constant and throbbing, 5/10 in severity, associated with N/V, and poor oral intake. This started 4 days prior to this admission but had gotten much worse. She denied fevers, chills, chest pain, shortness of breath, no specific urinary concerns, no blood in stool or urine, no focal neurological symptoms.   In ED, pt noted to be hemodynamically stable, CT abdomen unremarkable for acute findings, INR noted to be > 10.   After admission, the pt developed as of yet unexplained unresponsive episodes.  Her abdom pain has also persisted.  Despite improvement in her mental status, her original complaint of abdom pain has not yet been solved.  She additionally needs to work w/ PT/OT.    HPI/Subjective: Pt is alert and conversant.  She does not appear to be in distress, but when asked states that her abdom "hurts all over real bad."  She c/o severe nausea with attempts to eat, and a sensation that things aren't going through my stomach."  Assessment/Plan:  Lethargy / Altered mental status  Mental status waxed/wained after initial admit - no clear etiology was able to be found - this appears to have resolved for now - ABG unrevealing - CT head unrevealing - mild hypoglycemia not consistently linked to events - repeat UA was unrevealing, but initial was suggestive of possible UTI but culture proved to be negatvie - TSH normal   Abdominal pain with N/V CT abd with no specific abnormality noted - RUQ [name] normal - pt admits sx have been present "for a long time" - ?if this is gastroparesis - if sx persist, consider Nuc Med gastric empty scan - avoid  narcotics   UTI? Initial UA suggestive of such but f/u UA not revealing - culture with no growth   Hypoglycemia  Mild/moderate w/ low of 60 thus far - not likely low enough to explain altered mentation, and has not occurred at each lethargic spell - A1c 5.6 - appears to have resolved   Moderate hypotension - resolved Appears to have been related to simple volume depletion - hydrated  Acute anemia in setting of Anemia of chronic disease Baseline Hgb appears to be 11 (Feb 2015) - Hgb 9.9 at presentation and dropping with hydration - given INR >10 suspicious of occult blood loss - CT of abdom/pelvis at admit revealed no evidence of RPH - follow in serial fashion until proven to be stable   Acute renal injury crt 1.31 at presentation - baseline from March 2015 is 1.0 - improving with hydration - follow   Acute hypoxic respiratory failure  Resolved - CXR unrevealing - likely simply hypoventilation due to altered mentation   Chronic Afib Rate controlled - loveonox until INR back at goal   Supra therapeutic INR Dosed w/ vitamin K at admit - INR normalized - no clear evidence of bleeding found    Hx of Chronic systolic and diastolic CHF TTE 06/6387: EF 50-55%, normal wall motion, moderate MR, mild to moderate LAE, PASP 37   Hx of HTN   Mild Transaminitis Transaminases normal March 2015 - recheck in AM  Elevated alk phos outpt w/u has suggested non-liver source   Hypokalemia Resolved - follow -  Mg is ok  Obesity - Body mass index is 46.51 kg/(m^2).  Code Status: FULL Family Communication: spoke w/ pt and son at bedside  Disposition Plan: transfer to medical bed   Consultants: none  Procedures: none  Antibiotics: none  DVT prophylaxis: lovenox > warfarin  Objective: Blood pressure 122/67, pulse 62, temperature 98.3 F (36.8 C), temperature source Oral, resp. rate 10, height 5' 1" (1.549 m), weight 111.6 kg (246 lb 0.5 oz), SpO2 100.00%.  Intake/Output Summary (Last  24 hours) at 06/06/13 0809 Last data filed at 06/06/13 0700  Gross per 24 hour  Intake 3770.45 ml  Output      1 ml  Net 3769.45 ml   Exam: General: No acute respiratory distress - yells out in pain when touched anywhere Lungs: Clear to auscultation bilaterally without wheezes or crackles - distant BS  Cardiovascular: distant HS - regular rate - irregular rhythm - no appreciable M Abdomen: Obese, nondistended, soft, bowel sounds positive, no rebound, no ascites, no appreciable mass Extremities: No significant cyanosis, clubbing, or edema bilateral lower extremities  Data Reviewed: Basic Metabolic Panel:  Recent Labs Lab 06/03/13 1135 06/03/13 1825 06/04/13 0333 06/05/13 0242 06/05/13 1200 06/06/13 0233  NA 138  --  133* 130*  --  129*  K 3.4*  --  4.1 3.5* 4.1 3.8  CL 96  --  94* 93*  --  96  CO2 24  --  25 24  --  22  GLUCOSE 81  --  82 103*  --  90  BUN 36*  --  36* 35*  --  29*  CREATININE 1.31*  --  1.32* 1.74*  --  1.45*  CALCIUM 9.5  --  8.0* 7.4*  --  7.4*  MG  --  2.6*  --   --  2.0 1.9  PHOS  --  3.2  --   --   --   --    Liver Function Tests:  Recent Labs Lab 06/03/13 1135 06/05/13 0242  AST 49* 15  ALT 41* 29  ALKPHOS 153* 109  BILITOT 0.6 0.5  PROT 7.7 6.1  ALBUMIN 3.8 3.0*    Recent Labs Lab 06/03/13 1135  LIPASE 9*    Recent Labs Lab 06/04/13 1120  AMMONIA 65*   CBC:  Recent Labs Lab 06/03/13 1135 06/04/13 0333 06/04/13 1945 06/05/13 0242 06/06/13 0233  WBC 7.8 6.5 6.9 6.8 6.2  NEUTROABS 6.2  --   --   --   --   HGB 9.9* 8.8* 8.6* 8.7* 8.1*  HCT 30.6* 27.8* 27.5* 27.8* 26.1*  MCV 95.0 96.5 97.2 97.2 97.8  PLT 317 286 280 280 275   Cardiac Enzymes:  Recent Labs Lab 06/03/13 1135 06/03/13 1825 06/03/13 2335 06/04/13 0333  TROPONINI <0.30 <0.30 <0.30 <0.30   BNP (last 3 results)  Recent Labs  06/27/12 1129 07/13/12 1121 06/03/13 1825  PROBNP 4914.0* 106.0* 27115.0*   CBG:  Recent Labs Lab 06/04/13 2242  06/05/13 0738 06/05/13 1154 06/05/13 1717 06/05/13 2126  GLUCAP 118* 111* 112* 106* 106*    Recent Results (from the past 240 hour(s))  MRSA PCR SCREENING     Status: None   Collection Time    06/03/13  6:18 PM      Result Value Ref Range Status   MRSA by PCR NEGATIVE  NEGATIVE Final   Comment:            The GeneXpert MRSA Assay (FDA     approved  for NASAL specimens     only), is one component of a     comprehensive MRSA colonization     surveillance program. It is not     intended to diagnose MRSA     infection nor to guide or     monitor treatment for     MRSA infections.  URINE CULTURE     Status: None   Collection Time    06/03/13  8:51 PM      Result Value Ref Range Status   Specimen Description URINE, CATHETERIZED   Final   Special Requests NONE   Final   Culture  Setup Time     Final   Value: 06/03/2013 21:33     Performed at [facility]     Final   Value: NO GROWTH     Performed at [facility]   Culture     Final   Value: NO GROWTH     Performed at [facility]   Report Status 06/04/2013 FINAL   Final     Studies:  Recent x-ray studies have been reviewed in detail by the Attending Physician  Scheduled Meds:  Scheduled Meds: . bisacodyl  5 mg Oral Daily  . carvedilol  3.125 mg Oral BID WC  . ferrous fumarate  1 tablet Oral BID  . Warfarin - Pharmacist Dosing Inpatient   Does not apply q1800    Time spent on care of this patient: 35 mins   Cherene Altes , MD   [facility] Office  [phone] Pager - Text Page per Amion as per below:  On-Call/Text Page:      [email or web address]      password [email or web address]  If 7PM-7AM, please contact night-coverage [email or web address] Password [email or web address] 06/06/2013, 8:09 AM   LOS: 3 days

## 2013-06-06 NOTE — Progress Notes (Signed)
ANTICOAGULATION CONSULT NOTE - Follow Up Consult  Pharmacy Consult for heparin Indication: atrial fibrillation  Labs:  Recent Labs  06/03/13 1825 06/03/13 2335 06/04/13 0333 06/04/13 1120 06/04/13 1945 06/05/13 0242 06/05/13 2110 06/06/13 0233  HGB  --   --  8.8*  --  8.6* 8.7*  --  8.1*  HCT  --   --  27.8*  --  27.5* 27.8*  --  26.1*  PLT  --   --  286  --  280 280  --  275  LABPROT  --   --   --  15.9*  --   --  17.3* 17.5*  INR  --   --   --  1.30  --   --  1.45 1.48  HEPARINUNFRC  --   --   --   --   --   --   --  <0.10*  CREATININE  --   --  1.32*  --   --  1.74*  --  1.45*  TROPONINI <0.30 <0.30 <0.30  --   --   --   --   --     Assessment: 79yo female undetectable on heparin with initial dosing for Afib; during admission 1.60yr ago pt req'd up to 1850 units/hr for therapeutic levels.  Goal of Therapy:  Heparin level 0.3-0.7 units/ml   Plan:  Will increase heparin gtt by 4 units/kg/hr to 1500 units/hr and check level in [place], PharmD, BCPS  06/06/2013,4:47 AM

## 2013-06-07 ENCOUNTER — Other Ambulatory Visit: Payer: Self-pay | Admitting: *Deleted

## 2013-06-07 DIAGNOSIS — G9340 Encephalopathy, unspecified: Secondary | ICD-10-CM

## 2013-06-07 LAB — COMPREHENSIVE METABOLIC PANEL
ALT: 17 U/L (ref 0–35)
AST: 8 U/L (ref 0–37)
Albumin: 2.8 g/dL — ABNORMAL LOW (ref 3.5–5.2)
Alkaline Phosphatase: 104 U/L (ref 39–117)
BILIRUBIN TOTAL: 0.3 mg/dL (ref 0.3–1.2)
BUN: 22 mg/dL (ref 6–23)
CALCIUM: 7.6 mg/dL — AB (ref 8.4–10.5)
CHLORIDE: 99 meq/L (ref 96–112)
CO2: 24 meq/L (ref 19–32)
Creatinine, Ser: 1.1 mg/dL (ref 0.50–1.10)
GFR, EST AFRICAN AMERICAN: 55 mL/min — AB (ref >90)
GFR, EST NON AFRICAN AMERICAN: 48 mL/min — AB (ref >90)
GLUCOSE: 82 mg/dL (ref 70–99)
Potassium: 4 mEq/L (ref 3.7–5.3)
Sodium: 135 mEq/L — ABNORMAL LOW (ref 137–147)
Total Protein: 5.9 g/dL — ABNORMAL LOW (ref 6.0–8.3)

## 2013-06-07 LAB — CBC
HCT: 26.5 % — ABNORMAL LOW (ref 36.0–46.0)
HEMOGLOBIN: 8.3 g/dL — AB (ref 12.0–15.0)
MCH: 30.3 pg (ref 26.0–34.0)
MCHC: 31.3 g/dL (ref 30.0–36.0)
MCV: 96.7 fL (ref 78.0–100.0)
Platelets: 311 10*3/uL (ref 150–400)
RBC: 2.74 MIL/uL — ABNORMAL LOW (ref 3.87–5.11)
RDW: 14.5 % (ref 11.5–15.5)
WBC: 6.2 10*3/uL (ref 4.0–10.5)

## 2013-06-07 LAB — PROTIME-INR
INR: 1.74 — ABNORMAL HIGH (ref 0.00–1.49)
PROTHROMBIN TIME: 19.8 s — AB (ref 11.6–15.2)

## 2013-06-07 LAB — GLUCOSE, CAPILLARY
Glucose-Capillary: 81 mg/dL (ref 70–99)
Glucose-Capillary: 99 mg/dL (ref 70–99)

## 2013-06-07 MED ORDER — CARVEDILOL 3.125 MG PO TABS
3.1250 mg | ORAL_TABLET | Freq: Two times a day (BID) | ORAL | Status: DC
  Administered 2013-06-07: 3.125 mg via ORAL
  Filled 2013-06-07 (×3): qty 1

## 2013-06-07 MED ORDER — WARFARIN SODIUM 1 MG PO TABS
1.0000 mg | ORAL_TABLET | Freq: Once | ORAL | Status: DC
  Filled 2013-06-07: qty 1

## 2013-06-07 MED ORDER — HYDROCODONE-ACETAMINOPHEN 5-325 MG PO TABS: ORAL_TABLET | ORAL | Status: DC

## 2013-06-07 NOTE — Discharge Summary (Addendum)
Physician Discharge Summary  [name] ERX:[id number] DOB: 1934/12/21 DOA: 06/03/2013  PCP: Osborne Casco, MD  Admit date: 06/03/2013 Discharge date: 06/07/2013  Time spent: 40 minutes  Recommendations for Outpatient Follow-up:  1. Follow up with PCP in 2 weeks. 2. PT at SNF.  Discharge Diagnoses:  Active Problems:   CARDIOMYOPATHY   Systolic and diastolic CHF, chronic   Dehydration   Acute on chronic combined systolic and diastolic congestive heart failure   Weakness   Diastolic CHF   Pulmonary hypertension   Acute encephalopathy   Discharge Condition: stable  Diet recommendation: carb modified, low sodium,fluid restricted diet.  Filed Weights   06/05/13 0500 06/06/13 0500 06/07/13 0603  Weight: 108.3 kg (238 lb 12.1 oz) 111.6 kg (246 lb 0.5 oz) 112.5 kg (248 lb 0.3 oz)    History of present illness:  79 year old female with past medical history of obesity, hypertension, CHF, A. Fib, presented to [facility] ED with main concern of sudden onset of generalized abdominal pain constant and throbbing, 5/10 in severity, associated with N/V, poor oral intake. This initially started 4 days prior to this admission but has gotten much worse. She denies fevers, chills, chest pain, shortness of breath, no specific urinary concerns, no blood in stool or urine, no focal neurological symptoms.    Hospital Course:  Acute encephalopathy:  - Mental status waxed/wained after initial admit - no clear etiology was able to be found.  - ABG unrevealing - CT head unrevealing  - Repeat UA was unrevealing, UC no growth  - TSH normal.  - Stable for d/c. ? AKI.  - further evaluation for dementia by PCP as an outpatient.  Acute renal injury  - baseline from March 2015 is 1.0. Most likely pre-renal.  - On admission 1.3->1.7, resolved with IV fluids.   Abdominal pain with N/V  - CT abd with no specific abnormality noted  - RUQ [name] normal  - ? chornicity- pt admits sx have been present "for a  long time"  - avoid narcotics.   Hypoglycemia:  - Mild/moderate w/ low of 60 thus far  - A1c 5.6 - appears to have resolved   Moderate hypotension:  - resolved  - Appears to have been related to simple volume depletion.   Acute anemia in setting of Anemia of chronic disease  - Baseline Hgb appears to be 11 (Feb 2015)  - given INR >10 suspicious of occult blood loss, CT of abdom/pelvis at admit revealed no evidence of RPH  - has remained stable.   Acute hypoxic respiratory failure  Resolved - CXR unrevealing - likely simply hypoventilation due to altered mentation.   Chronic Afib  - Rate controlled.  - d/ lovenox, cont coumadin   Supra therapeutic INR  - Dosed w/ vitamin K at admit - INR normalized - no clear evidence of bleeding found.   Hx of Chronic systolic and diastolic CHF  - TTE 10/5091: EF 50-55%.  - cont home meds.   Mild Transaminitis  Transaminases normal March 2015.  - resolved.   Elevated alk phos  outpt w/u has suggested non-liver source.   Hypokalemia  Resolved - follow - Mg is ok   Procedures: [name] abd Ct abd Ct head cxr   Consultations:  none  Discharge Exam: Filed Vitals:   06/07/13 0603  BP: 120/65  Pulse: 65  Temp: 98.2 F (36.8 C)  Resp: 18    General: A&o X3 Cardiovascular: RRR Respiratory: good ai movement CTA B/L  Discharge Instructions  You were cared for by a hospitalist during your hospital stay. If you have any questions about your discharge medications or the care you received while you were in the hospital after you are discharged, you can call the unit and asked to speak with the hospitalist on call if the hospitalist that took care of you is not available. Once you are discharged, your primary care physician will handle any further medical issues. Please note that NO REFILLS for any discharge medications will be authorized once you are discharged, as it is imperative that you return to your primary care physician (or establish  a relationship with a primary care physician if you do not have one) for your aftercare needs so that they can reassess your need for medications and monitor your lab values.      Discharge Orders   Future Appointments Provider Department Dept Phone   06/18/2013 11:45 AM [facility] [place] Office [phone]   07/16/2013 11:30 AM Minus Breeding, MD [facility] [phone]   Future Orders Complete By Expires   Diet - low sodium heart healthy  As directed    Increase activity slowly  As directed        Medication List         bisacodyl 5 MG EC tablet  Commonly known as:  DULCOLAX  Take 5 mg by mouth daily. For     carvedilol 6.25 MG tablet  Commonly known as:  COREG  take 1 tablet by mouth twice a day with food     FERRETTS 325 (106 FE) MG Tabs tablet  Generic drug:  ferrous fumarate  Take 1 tablet by mouth 2 (two) times daily.     furosemide 80 MG tablet  Commonly known as:  LASIX  Take $Rem'80mg'ZGRB$  in the am, $Remov'40mg'wYsOWh$  in the pm     HYDROcodone-acetaminophen 5-500 MG per tablet  Commonly known as:  VICODIN  Take 1 tablet by mouth every 8 (eight) hours as needed for pain.     lisinopril 5 MG tablet  Commonly known as:  PRINIVIL,ZESTRIL  take 1 tablet by mouth once daily     metolazone 5 MG tablet  Commonly known as:  ZAROXOLYN  Take 5 mg by mouth as needed. Take with morning dose of lasix     traMADol 50 MG tablet  Commonly known as:  ULTRAM  Take 50 mg by mouth every 8 (eight) hours as needed for pain. For pain     warfarin 2.5 MG tablet  Commonly known as:  COUMADIN  of 1 tablet daily or as directed by coumadin clinic       Allergies  Allergen Reactions  . Penicillins Other (See Comments)    unknown  . Tetanus-Diphtheria Toxoids Td Other (See Comments)    unknown  . Latex Hives and Rash  . Levaquin [Levofloxacin In D5w] Rash   Follow-up Information   Follow up with Osborne Casco, MD In 1 week. (hospital  follow up)    Specialty:  Family Medicine   Contact information:   [address] [place] [address] [phone]        The results of significant diagnostics from this hospitalization (including imaging, microbiology, ancillary and laboratory) are listed below for reference.    Significant Diagnostic Studies: Ct Head Wo Contrast  06/03/2013   CLINICAL DATA:  Lethargy.  Altered mental status.  EXAM: CT HEAD WITHOUT CONTRAST  TECHNIQUE: Contiguous axial images were obtained  from the base of the skull through the vertex without intravenous contrast.  COMPARISON:  None.  FINDINGS: There is no evidence of acute infarction, mass lesion, or intra- or extra-axial hemorrhage on CT.  Diffuse periventricular and subcortical white matter change likely reflects small vessel ischemic microangiopathy. Prominence of the ventricles suggests mild cortical volume loss. Mild cerebellar atrophy is noted.  The brainstem and fourth ventricle are within normal limits. The basal ganglia are unremarkable in appearance. The cerebral hemispheres demonstrate grossly normal gray-white differentiation. No mass effect or midline shift is seen.  There is no evidence of fracture; visualized osseous structures are unremarkable in appearance. The visualized portions of the orbits are within normal limits. A small mucus retention cyst or polyp is noted within the right maxillary sinus. The remaining paranasal sinuses and mastoid air cells are well-aerated. No significant soft tissue abnormalities are seen.  IMPRESSION: 1. No acute intracranial pathology seen on CT. 2. Mild cortical volume loss and diffuse small vessel ischemic microangiopathy. 3. Small mucus retention cyst or polyp within the right maxillary sinus.   Electronically Signed   By: Garald Balding M.D.   On: 06/03/2013 21:24   Ct Abdomen Pelvis W Contrast  06/03/2013   CLINICAL DATA:  Abdominal pain and vomiting.  Weakness.  EXAM: CT ABDOMEN AND PELVIS  WITH CONTRAST  TECHNIQUE: Multidetector CT imaging of the abdomen and pelvis was performed using the standard protocol following bolus administration of intravenous contrast.  CONTRAST:  80 mL Omnipaque 300  COMPARISON:  06/26/2012  FINDINGS: Minimal scarring and/or atelectasis is noted in the lung bases. Heart is incompletely visualized but appears enlarged.  Streak artifact is present in the upper abdomen due to patient's inability to raise her arms. The liver appears slightly heterogeneous in density, however this may be due to superimposed streak artifact. No definite focal liver lesion is identified. The gallbladder, spleen, right adrenal gland, and pancreas have an unremarkable enhanced appearance. There is mild diffuse thickening of the left adrenal gland. Small right lower pole renal cysts measure 1.4 and 1.7 cm, similar to the prior study. Multiple left renal cysts also do not appear significantly changed, the largest measuring 3.3 cm and arising from the lower pole. There is no hydronephrosis.  There is a small hiatal hernia. Oral contrast is present in nondilated loops of small bowel to the level the proximal colon without evidence of obstruction. There is left-sided colonic diverticulosis without evidence of diverticulitis. Appendix is not clearly identified, however no inflammatory changes are present in the right lower quadrant.  Uterus is absent. Ovaries are unremarkable. Bladder is unremarkable. Mild to moderate aortoiliac atherosclerotic calcification is noted. No free fluid or enlarged lymph nodes are identified. Multilevel degenerative disc disease with vacuum disc phenomenon is noted in the lower thoracic spine. Mild multilevel degenerative disc disease and facet arthrosis are present in the lower lumbar spine, with unchanged grade 1 anterolisthesis of L4 on L5.  IMPRESSION: No acute abnormality identified in the abdomen or pelvis. No etiology for abdominal pain identified.   Electronically  Signed   By: Logan Bores   On: 06/03/2013 15:31   Dg Chest Port 1 View  06/03/2013   CLINICAL DATA:  Dyspnea  EXAM: PORTABLE CHEST - 1 VIEW  COMPARISON:  03/22/2013  FINDINGS: Increased interstitial markings/bronchitic changes. No focal consolidation. No pleural effusion or pneumothorax.  The heart is top-normal in size.  IMPRESSION: No evidence of acute cardiopulmonary disease.   Electronically Signed   By: Julian Hy  M.D.   On: 06/03/2013 18:49   US Abdomen Limited Ruq  06/06/2013   CLINICAL DATA:  Upper abdominal pain and abnormal liver enzymes  EXAM: US ABDOMEN LIMITED - RIGHT UPPER QUADRANT  COMPARISON:  None.  FINDINGS: Gallbladder:  No gallstones or wall thickening visualized. There is no pericholecystic fluid. Gallbladder distention appears within normal limits. No sonographic Murphy sign noted.  Common bile duct:  Diameter: 4 mm. There is no intrahepatic or extrahepatic biliary duct dilatation.  Liver:  No focal lesion identified. Within normal limits in parenchymal echogenicity.  IMPRESSION: Study within normal limits.   Electronically Signed   By: Lowella Grip M.D.   On: 06/06/2013 08:20    Microbiology: Recent Results (from the past 240 hour(s))  MRSA PCR SCREENING     Status: None   Collection Time    06/03/13  6:18 PM      Result Value Ref Range Status   MRSA by PCR NEGATIVE  NEGATIVE Final   Comment:            The GeneXpert MRSA Assay (FDA     approved for NASAL specimens     only), is one component of a     comprehensive MRSA colonization     surveillance program. It is not     intended to diagnose MRSA     infection nor to guide or     monitor treatment for     MRSA infections.  URINE CULTURE     Status: None   Collection Time    06/03/13  8:51 PM      Result Value Ref Range Status   Specimen Description URINE, CATHETERIZED   Final   Special Requests NONE   Final   Culture  Setup Time     Final   Value: 06/03/2013 21:33     Performed at [facility]     Final   Value: NO GROWTH     Performed at [facility]   Culture     Final   Value: NO GROWTH     Performed at [facility]   Report Status 06/04/2013 FINAL   Final     Labs: Basic Metabolic Panel:  Recent Labs Lab 06/03/13 1135 06/03/13 1825 06/04/13 0333 06/05/13 0242 06/05/13 1200 06/06/13 0233 06/07/13 0540  NA 138  --  133* 130*  --  129* 135*  K 3.4*  --  4.1 3.5* 4.1 3.8 4.0  CL 96  --  94* 93*  --  96 99  CO2 24  --  25 24  --  22 24  GLUCOSE 81  --  82 103*  --  90 82  BUN 36*  --  36* 35*  --  29* 22  CREATININE 1.31*  --  1.32* 1.74*  --  1.45* 1.10  CALCIUM 9.5  --  8.0* 7.4*  --  7.4* 7.6*  MG  --  2.6*  --   --  2.0 1.9  --   PHOS  --  3.2  --   --   --   --   --    Liver Function Tests:  Recent Labs Lab 06/03/13 1135 06/05/13 0242 06/07/13 0540  AST 49* 15 8  ALT 41* 29 17  ALKPHOS 153* 109 104  BILITOT 0.6 0.5 0.3  PROT 7.7 6.1 5.9*  ALBUMIN 3.8 3.0* 2.8*    Recent Labs Lab 06/03/13 1135  LIPASE 9*  Recent Labs Lab 06/04/13 1120  AMMONIA 65*   CBC:  Recent Labs Lab 06/03/13 1135 06/04/13 0333 06/04/13 1945 06/05/13 0242 06/06/13 0233 06/07/13 0540  WBC 7.8 6.5 6.9 6.8 6.2 6.2  NEUTROABS 6.2  --   --   --   --   --   HGB 9.9* 8.8* 8.6* 8.7* 8.1* 8.3*  HCT 30.6* 27.8* 27.5* 27.8* 26.1* 26.5*  MCV 95.0 96.5 97.2 97.2 97.8 96.7  PLT 317 286 280 280 275 311   Cardiac Enzymes:  Recent Labs Lab 06/03/13 1135 06/03/13 1825 06/03/13 2335 06/04/13 0333  TROPONINI <0.30 <0.30 <0.30 <0.30   BNP: BNP (last 3 results)  Recent Labs  06/27/12 1129 07/13/12 1121 06/03/13 1825  PROBNP 4914.0* 106.0* 27115.0*   CBG:  Recent Labs Lab 06/06/13 0831 06/06/13 1159 06/06/13 1634 06/06/13 2101 06/07/13 0626  GLUCAP 153* 92 119* 92 81       Signed:  Charlynne Cousins  [facility] 06/07/2013, 8:37 AM

## 2013-06-07 NOTE — Evaluation (Signed)
Occupational Therapy Evaluation Patient Details Name: [name] MRN: [id number] DOB: 1934-06-19 Today's Date: 06/07/2013    History of Present Illness 79 year old female with past medical history of obesity, hypertension, CHF, and A. Fib, who presented to [facility] ED with main concern of sudden onset of generalized abdominal pain described as constant and throbbing, 5/10 in severity, associated with N/V, and poor oral intake. This started 4 days prior to this admission but had gotten much worse.  Pt states during this time she has not been able to move well.  Evaluation of illness is ongoing.   Clinical Impression   Pt admitted with the above diagnosis and has the deficits listed below. Pt would benefit from cont OT to increase I with basic adls so she can eventually return home [place].  Will cont to attempt to motivate pt to be more I with her adls.    Follow Up Recommendations  SNF;Supervision/Assistance - 24 hour    Equipment Recommendations  None recommended by OT    Recommendations for Other Services       Precautions / Restrictions Precautions Precautions: Fall Restrictions Weight Bearing Restrictions: No      Mobility Bed Mobility Overal bed mobility: Needs Assistance Bed Mobility: Supine to Sit     Supine to sit: Mod assist     General bed mobility comments: pt had difficulty scooting to edge.  truncal assist needed to get up/  Transfers Overall transfer level: Needs assistance Equipment used: Rolling walker (2 wheeled) Transfers: Sit to/from [facility] Sit to Stand: Mod assist Stand pivot transfers: Mod assist       General transfer comment: cues for hand placement and safety; assist to come forward    Balance Overall balance assessment: Needs assistance Sitting-balance support: Bilateral upper extremity supported;Feet supported Sitting balance-Leahy Scale: Good     Standing balance support: Bilateral upper extremity supported;During  functional activity Standing balance-Leahy Scale: Poor Standing balance comment: pt needs RW to stand and feel she would fall w/o it.  With the walker, pt does pretty well.                            ADL Overall ADL's : Needs assistance/impaired Eating/Feeding: Set up;Sitting   Grooming: Wash/dry face;Wash/dry hands;Oral care;Set up;Sitting   Upper Body Bathing: Set up;Sitting   Lower Body Bathing: Maximal assistance;Sit to/from stand Lower Body Bathing Details (indicate cue type and reason): Pt needs assist to stand and wash between her legs as well as assist to reach below knees to bathe. Upper Body Dressing : Set up;Sitting   Lower Body Dressing: Maximal assistance;Sit to/from stand Lower Body Dressing Details (indicate cue type and reason): assist needed to get into standing to pull up pants.  Pt cannot access her legs to dress.  She states she wears a large white T-shirt at home and rarely goes out. Toilet Transfer: Moderate assistance;Stand-pivot;BSC Toilet Transfer Details (indicate cue type and reason): Pt with great pain when standing stating all of her bones hurt.  Pt can pivot but needs assist for balance. Toileting- Clothing Manipulation and Hygiene: Moderate assistance;Sit to/from stand Toileting - Clothing Manipulation Details (indicate cue type and reason): Pt unable to let go of the walker for long to pull up her pants and maintain balance at same time.     Functional mobility during ADLs: Moderate assistance;Rolling walker General ADL Comments: Pt needs a lot of assist for LE adls and encouragement to attempt new  things.     Vision                     Perception     Praxis      Pertinent Vitals/Pain Pt yelled in pain with all mobility.  Pt unable to rate it and states it is all over in her "bones."  Nursing notified.     Hand Dominance Right   Extremity/Trunk Assessment Upper Extremity Assessment Upper Extremity Assessment: Generalized  weakness   Lower Extremity Assessment Lower Extremity Assessment: Defer to PT evaluation   Cervical / Trunk Assessment Cervical / Trunk Assessment: Normal   Communication Communication Communication: No difficulties   Cognition Arousal/Alertness: Awake/alert Behavior During Therapy: WFL for tasks assessed/performed Overall Cognitive Status: Within Functional Limits for tasks assessed                     General Comments       Exercises       Shoulder Instructions      Home Living Family/patient expects to be discharged to:: Private residence Living Arrangements: Alone Available Help at Discharge: Family;Available PRN/intermittently;Friend(s) Type of Home: Apartment Home Access: Stairs to enter Entrance Stairs-Number of Steps: 2   Home Layout: One level     Bathroom Shower/Tub: Tub/shower unit;Curtain (has been sponge bathing) Shower/tub characteristics: Architectural technologist: Standard     Home Equipment: Toilet riser;Walker - 2 wheels;Tub bench   Additional Comments: pt's daughter runs errands and gets groceries.  Pt goes out rarely      Prior Functioning/Environment Level of Independence: Independent with assistive device(s)        Comments: Mostly takes sponge baths    OT Diagnosis: Generalized weakness;Acute pain   OT Problem List: Decreased strength;Decreased activity tolerance;Impaired balance (sitting and/or standing);Decreased knowledge of precautions;Decreased knowledge of use of DME or AE;Obesity;Pain   OT Treatment/Interventions: Self-care/ADL training;DME and/or AE instruction;Therapeutic activities;Balance training    OT Goals(Current goals can be found in the care plan section) Acute Rehab OT Goals Patient Stated Goal: Get back home and do for myself at home OT Goal Formulation: With patient Time For Goal Achievement: 06/21/13 Potential to Achieve Goals: Fair ADL Goals Pt Will Perform Grooming: with supervision;standing Pt Will  Perform Lower Body Bathing: with min assist;with adaptive equipment;sit to/from stand Pt Will Perform Lower Body Dressing: with min assist;with adaptive equipment;sit to/from stand Additional ADL Goal #1: Pt will complete all toileting with 3:1 over commode with S  OT Frequency: Min 2X/week   Barriers to D/C: Decreased caregiver support  pt only has PRN assist from family.       Co-evaluation              End of Session Equipment Utilized During Treatment: Rolling walker Nurse Communication: Mobility status  Activity Tolerance: Patient limited by fatigue;Patient limited by pain Patient left: in bed;with call bell/phone within reach;with family/visitor present   Time: 1213-1230 OT Time Calculation (min): 17 min Charges:  OT General Charges $OT Visit: 1 Procedure OT Evaluation $Initial OT Evaluation Tier I: 1 Procedure OT Treatments $Self Care/Home Management : 8-22 mins G-Codes:    Vickki Muff Jun 25, 2013, 12:40 PM [phone]

## 2013-06-07 NOTE — Telephone Encounter (Signed)
rx faxed to [facility] @ [phone]. 

## 2013-06-07 NOTE — Progress Notes (Signed)
[facility] PROGRESS NOTE Assessment/Plan: Acute encephalopathy: - Mental status waxed/wained after initial admit - no clear etiology was able to be found. - ABG unrevealing - CT head unrevealing  - Repeat UA was unrevealing, UC no growth - TSH normal. - Stable for d/c. ? If dementia.  Acute renal injury  - baseline from March 2015 is 1.0. Most likely pre-renal. - On admission 1.3->1.7, resolved with IV fluids.  Abdominal pain with N/V  - CT abd with no specific abnormality noted  - RUQ [name] normal  - ? chornicity- pt admits sx have been present "for a long time" - avoid narcotics.  Hypoglycemia: - Mild/moderate w/ low of 60 thus far - A1c 5.6 - appears to have resolved   Moderate hypotension:  - resolved  - Appears to have been related to simple volume depletion.   Acute anemia in setting of Anemia of chronic disease  - Baseline Hgb appears to be 11 (Feb 2015)  - given INR >10 suspicious of occult blood loss,  CT of abdom/pelvis at admit revealed no evidence of RPH  - has remained stable.  Acute hypoxic respiratory failure  Resolved - CXR unrevealing - likely simply hypoventilation due to altered mentation.  Chronic Afib  - Rate controlled. - d/ lovenox, cont coumadin  Supra therapeutic INR  - Dosed w/ vitamin K at admit - INR normalized - no clear evidence of bleeding found.  Hx of Chronic systolic and diastolic CHF  - TTE 02/8839: EF 50-55%. - cont home meds.  Mild Transaminitis  Transaminases normal March 2015. - resolved.   Elevated alk phos  outpt w/u has suggested non-liver source.  Hypokalemia  Resolved - follow - Mg is ok  Code Status: FULL  Family Communication: spoke w/ pt and son at bedside  Disposition Plan: transfer to medical bed     Consultants:  none  Procedures:  [name] abd  Ct abd  Ct head  cxr    Antibiotics:  none  HPI/Subjective: No complains  Objective: Filed Vitals:   06/06/13 1148 06/06/13 2104  06/07/13 0125 06/07/13 0603  BP: 111/55 115/59 114/50 120/65  Pulse: 66 67 69 65  Temp: 98.6 F (37 C) 98.7 F (37.1 C) 98 F (36.7 C) 98.2 F (36.8 C)  TempSrc: Oral Oral Oral Oral  Resp: $Remo'20 20 18 18  'mAjQO$ Height:      Weight:    112.5 kg (248 lb 0.3 oz)  SpO2: 100% 100% 100% 100%    Intake/Output Summary (Last 24 hours) at 06/07/13 0819 Last data filed at 06/06/13 1100  Gross per 24 hour  Intake    520 ml  Output      2 ml  Net    518 ml   Filed Weights   06/05/13 0500 06/06/13 0500 06/07/13 0603  Weight: 108.3 kg (238 lb 12.1 oz) 111.6 kg (246 lb 0.5 oz) 112.5 kg (248 lb 0.3 oz)    Exam:  General: Alert, awake, oriented x2, in no acute distress.  HEENT: No bruits, no goiter.  Heart: Regular rate and rhythm, without murmurs, rubs, gallops.  Lungs: Good air movement, clear Abdomen: Soft, nontender, nondistended, positive bowel sounds.  Neuro: Grossly intact, nonfocal.   Data Reviewed: Basic Metabolic Panel:  Recent Labs Lab 06/03/13 1135 06/03/13 1825 06/04/13 0333 06/05/13 0242 06/05/13 1200 06/06/13 0233 06/07/13 0540  NA 138  --  133* 130*  --  129* 135*  K 3.4*  --  4.1 3.5* 4.1 3.8 4.0  CL 96  --  94* 93*  --  96 99  CO2 24  --  25 24  --  22 24  GLUCOSE 81  --  82 103*  --  90 82  BUN 36*  --  36* 35*  --  29* 22  CREATININE 1.31*  --  1.32* 1.74*  --  1.45* 1.10  CALCIUM 9.5  --  8.0* 7.4*  --  7.4* 7.6*  MG  --  2.6*  --   --  2.0 1.9  --   PHOS  --  3.2  --   --   --   --   --    Liver Function Tests:  Recent Labs Lab 06/03/13 1135 06/05/13 0242 06/07/13 0540  AST 49* 15 8  ALT 41* 29 17  ALKPHOS 153* 109 104  BILITOT 0.6 0.5 0.3  PROT 7.7 6.1 5.9*  ALBUMIN 3.8 3.0* 2.8*    Recent Labs Lab 06/03/13 1135  LIPASE 9*    Recent Labs Lab 06/04/13 1120  AMMONIA 65*   CBC:  Recent Labs Lab 06/03/13 1135 06/04/13 0333 06/04/13 1945 06/05/13 0242 06-20-2013 0233 06/07/13 0540  WBC 7.8 6.5 6.9 6.8 6.2 6.2  NEUTROABS 6.2  --    --   --   --   --   HGB 9.9* 8.8* 8.6* 8.7* 8.1* 8.3*  HCT 30.6* 27.8* 27.5* 27.8* 26.1* 26.5*  MCV 95.0 96.5 97.2 97.2 97.8 96.7  PLT 317 286 280 280 275 311   Cardiac Enzymes:  Recent Labs Lab 06/03/13 1135 06/03/13 1825 06/03/13 2335 06/04/13 0333  TROPONINI <0.30 <0.30 <0.30 <0.30   BNP (last 3 results)  Recent Labs  06/27/12 1129 07/13/12 1121 06/03/13 1825  PROBNP 4914.0* 106.0* 27115.0*   CBG:  Recent Labs Lab 06-20-13 0831 06-20-13 1159 Jun 20, 2013 1634 06/20/2013 2101 06/07/13 0626  GLUCAP 153* 92 119* 92 81    Recent Results (from the past 240 hour(s))  MRSA PCR SCREENING     Status: None   Collection Time    06/03/13  6:18 PM      Result Value Ref Range Status   MRSA by PCR NEGATIVE  NEGATIVE Final   Comment:            The GeneXpert MRSA Assay (FDA     approved for NASAL specimens     only), is one component of a     comprehensive MRSA colonization     surveillance program. It is not     intended to diagnose MRSA     infection nor to guide or     monitor treatment for     MRSA infections.  URINE CULTURE     Status: None   Collection Time    06/03/13  8:51 PM      Result Value Ref Range Status   Specimen Description URINE, CATHETERIZED   Final   Special Requests NONE   Final   Culture  Setup Time     Final   Value: 06/03/2013 21:33     Performed at [facility]   Colony Count     Final   Value: NO GROWTH     Performed at [facility]   Culture     Final   Value: NO GROWTH     Performed at [facility]   Report Status 06/04/2013 FINAL   Final     Studies: US Abdomen Limited Ruq  Jun 20, 2013   CLINICAL  DATA:  Upper abdominal pain and abnormal liver enzymes  EXAM: US ABDOMEN LIMITED - RIGHT UPPER QUADRANT  COMPARISON:  None.  FINDINGS: Gallbladder:  No gallstones or wall thickening visualized. There is no pericholecystic fluid. Gallbladder distention appears within normal limits. No sonographic Murphy sign noted.   Common bile duct:  Diameter: 4 mm. There is no intrahepatic or extrahepatic biliary duct dilatation.  Liver:  No focal lesion identified. Within normal limits in parenchymal echogenicity.  IMPRESSION: Study within normal limits.   Electronically Signed   By: Lowella Grip M.D.   On: 06/06/2013 08:20    Scheduled Meds: . bisacodyl  5 mg Oral Daily  . carvedilol  3.125 mg Oral BID WC  . enoxaparin (LOVENOX) injection  1 mg/kg Subcutaneous BID  . ferrous fumarate  1 tablet Oral BID  . Warfarin - Pharmacist Dosing Inpatient   Does not apply q1800   Continuous Infusions:    Charlynne Cousins  [facility] Pager [phone]. If 8PM-8AM, please contact night-coverage at [email or web address], password [facility] 06/07/2013, 8:19 AM  LOS: 4 days

## 2013-06-14 ENCOUNTER — Encounter: Payer: Self-pay | Admitting: Internal Medicine

## 2013-06-14 ENCOUNTER — Non-Acute Institutional Stay (SKILLED_NURSING_FACILITY): Payer: Medicare Other | Admitting: Internal Medicine

## 2013-06-14 DIAGNOSIS — R109 Unspecified abdominal pain: Secondary | ICD-10-CM | POA: Insufficient documentation

## 2013-06-14 DIAGNOSIS — D509 Iron deficiency anemia, unspecified: Secondary | ICD-10-CM

## 2013-06-14 DIAGNOSIS — G9340 Encephalopathy, unspecified: Secondary | ICD-10-CM

## 2013-06-14 DIAGNOSIS — I4891 Unspecified atrial fibrillation: Secondary | ICD-10-CM

## 2013-06-14 DIAGNOSIS — I5042 Chronic combined systolic (congestive) and diastolic (congestive) heart failure: Secondary | ICD-10-CM

## 2013-06-14 NOTE — Assessment & Plan Note (Signed)
TTE 04/2013; EF 50-55%; coreg, lasix with zaroxyln and ACE

## 2013-06-14 NOTE — Progress Notes (Signed)
MRN: [id number] Name: [name]  Sex: female Age: 79 y.o. DOB: July 23, 1934  [facility] Facility/Room:301 Level Of Care: SNF Provider: Hennie Duos Emergency Contacts: Extended Emergency Contact Information Primary Emergency Contact: Barella,Reggie Address: [place], [place] [address] [name] of [place] Phone: [phone] Work Phone: [phone] Relation: Son Secondary Emergency Contact: Ruiter,[name]          Lady Gary, [place] of [place] Phone: [phone] Relation: Daughter  Code Status: FULL  Allergies: Penicillins; Tetanus-diphtheria toxoids td; Latex; and Levaquin  Chief Complaint  Patient presents with  . nursing home admission    HPI: Patient is 79 y.o. female who is admitted to SNF for generalized weakness after hosp for acute encephalopathy.  Past Medical History  Diagnosis Date  . HTN (hypertension)   . Diverticulosis   . Hemorrhoid   . Osteoarthritis   . Constipation, chronic   . H/O: hysterectomy   . Systolic and diastolic CHF, chronic     a. Echo 07/2011: Technically limited; inferior HK, inferolateral and possibly lateral wall HK, EF 40%, moderate LVE, aortic sclerosis without stenosis, mild MR, mild LAE, question RV dysfunction;   b.  Echo (04/2013): EF 50-55%, normal wall motion, moderate MR, mild to moderate LAE, PASP 37  . Female bladder prolapse     with prolapse uterus, cystocele, s/p TVH/BSO 12/2006  . Obesity (BMI 30-39.9)     wt. 94.1 kg 12/2010  . Moderate mitral regurgitation     a. Mod to Sev by echo 01/2009;  b. mild MR by echo 2013  . Atrial fibrillation 10/13/2011    coumadin Rx  . Iron deficiency anemia     Past Surgical History  Procedure Laterality Date  . Wrist surgery    . Vaginal hysterectomy,cystocele and prolapse  repair  NOV. 2008  . Abdominal hysterectomy        Medication List       This list is accurate as of: 06/14/13  1:41 PM.  Always use your most recent  med list.               bisacodyl 5 MG EC tablet  Commonly known as:  DULCOLAX  Take 5 mg by mouth daily. For     carvedilol 6.25 MG tablet  Commonly known as:  COREG  take 1 tablet by mouth twice a day with food     FERRETTS 325 (106 FE) MG Tabs tablet  Generic drug:  ferrous fumarate  Take 1 tablet by mouth 2 (two) times daily.     furosemide 80 MG tablet  Commonly known as:  LASIX  Take 80mg  in the am, 40mg  in the pm     HYDROcodone-acetaminophen 5-325 MG per tablet  Commonly known as:  NORCO/VICODIN  Take 1 tablet by mouth every 8 hours as needed for pain *DO NOT EXCEED 3 GM APAP/24HR*     lisinopril 5 MG tablet  Commonly known as:  PRINIVIL,ZESTRIL  take 1 tablet by mouth once daily     metolazone 5 MG tablet  Commonly known as:  ZAROXOLYN  Take 5 mg by mouth as needed. Take with morning dose of lasix     traMADol 50 MG tablet  Commonly known as:  ULTRAM  Take 50 mg by mouth every 8 (eight) hours as needed for pain. For pain     warfarin 2.5 MG tablet  Commonly known as:  COUMADIN  of 1 tablet daily or as directed by coumadin clinic        No orders of the defined types were placed in this encounter.     There is no immunization history on file for this patient.  History  Substance Use Topics  . Smoking status: Never Smoker   . Smokeless tobacco: Current User    Types: Snuff     Comment: Uses Snuff regularly.  Dips several x/day.  A "large can" can last her up to 3 mos.  . Alcohol Use: No    Family history is noncontributory    Review of Systems  DATA OBTAINED: from patient GENERAL: Feels well no fevers, fatigue, appetite changes SKIN: No itching, rash or wounds EYES: No eye pain, redness, discharge EARS: No earache, tinnitus, change in hearing NOSE: No congestion, drainage or bleeding  MOUTH/THROAT: No mouth or tooth pain,  RESPIRATORY: No cough, wheezing, SOB CARDIAC: No chest pain, palpitations, lower extremity edema  GI: No abdominal  pain, No N/V/D;  +constipation, No heartburn or reflux  GU: No dysuria, frequency or urgency, or incontinence  MUSCULOSKELETAL: c/o joint pain all over NEUROLOGIC: No headache, dizziness or focal weakness PSYCHIATRIC: No overt anxiety or sadness. Sleeps well. No behavior issue.   Filed Vitals:   06/14/13 1325  BP: 96/65  Pulse: 76  Temp: 97.6 F (36.4 C)  Resp: 22    Physical Exam  GENERAL APPEARANCE: Alert, conversant. Appropriately groomed. No acute distress.  SKIN: No diaphoresis rash HEAD: Normocephalic, atraumatic  EYES: Conjunctiva/lids clear. Pupils round, reactive. EOMs intact.  EARS: External exam WNL, canals clear. Hearing grossly normal.  NOSE: No deformity or discharge.  MOUTH/THROAT: Lips w/o lesions  RESPIRATORY: Breathing is even, unlabored. Lung sounds are clear   CARDIOVASCULAR: Heart RRR no murmurs, rubs or gallops. No peripheral edema.  GASTROINTESTINAL: Abdomen is soft, non-tender, not distended w/ normal bowel sounds GENITOURINARY: Bladder non tender, not distended  MUSCULOSKELETAL: No abnormal joints or musculature NEUROLOGIC: Oriented X3. Cranial nerves 2-12 grossly intact. Moves all extremities no tremor. PSYCHIATRIC: Mood and affect appropriate to situation, no behavioral issues  Patient Active Problem List   Diagnosis Date Noted  . Acute encephalopathy 06/07/2013  . Diastolic CHF 13/24/4010  . Pulmonary hypertension 06/05/2013  . Weakness 06/03/2013  . Encounter for therapeutic drug monitoring 04/10/2013  . Long term current use of anticoagulant 10/26/2011  . Hypokalemia 10/15/2011  . Abdominal pain 10/15/2011  . Iron deficiency anemia 10/15/2011  . Bladder prolapse, female, acquired 10/15/2011  . Atrial fibrillation 10/15/2011  . Acute on chronic combined systolic and diastolic congestive heart failure 07/22/2011  . Facial edema 12/30/2010  . Leukopenia 12/30/2010  . Neutropenia 12/30/2010  . Abdominal pain, unspecified site 12/27/2010  .  Chronic pain 12/25/2010  . Hematochezia 12/25/2010  . Dehydration 12/25/2010  . Thrombocytosis 12/25/2010  . Hyponatremia 12/25/2010  . Systolic and diastolic CHF, chronic   . Female bladder prolapse   . Mitral regurgitation 06/04/2010  . HYPOPOTASSEMIA 09/24/2009  . COMBINED HEART FAILURE, CHRONIC 09/09/2009  . CARDIOMYOPATHY 03/04/2009  . ANASARCA 03/04/2009  . HYPERTENSION 02/20/2009  . CONSTIPATION, CHRONIC 02/20/2009  . DIVERTICULOSIS 02/20/2009    CBC    Component Value Date/Time   WBC 6.2 06/07/2013 0540   RBC 2.74* 06/07/2013 0540   RBC 3.20* 06/04/2013 1120   HGB 8.3* 06/07/2013 0540   HCT 26.5* 06/07/2013 0540   PLT 311 06/07/2013 0540   MCV 96.7 06/07/2013 0540   LYMPHSABS 1.0 06/03/2013 1135  MONOABS 0.6 06/03/2013 1135   EOSABS 0.0 06/03/2013 1135   BASOSABS 0.0 06/03/2013 1135    CMP     Component Value Date/Time   NA 135* 06/07/2013 0540   K 4.0 06/07/2013 0540   CL 99 06/07/2013 0540   CO2 24 06/07/2013 0540   GLUCOSE 82 06/07/2013 0540   BUN 22 06/07/2013 0540   CREATININE 1.10 06/07/2013 0540   CALCIUM 7.6* 06/07/2013 0540   PROT 5.9* 06/07/2013 0540   ALBUMIN 2.8* 06/07/2013 0540   AST 8 06/07/2013 0540   ALT 17 06/07/2013 0540   ALKPHOS 104 06/07/2013 0540   BILITOT 0.3 06/07/2013 0540   GFRNONAA 48* 06/07/2013 0540   GFRAA 55* 06/07/2013 0540    Assessment and Plan  Acute encephalopathy - Mental status waxed/wained after initial admit - no clear etiology was able to be found.  - ABG unrevealing - CT head unrevealing  - Repeat UA was unrevealing, UC no growth  - TSH normal.  - Stable for d/c. ? AKI.  - further evaluation for dementia by PCP as an outpatient   Abdominal pain, unspecified site CT abd with no specific abnormality noted  - RUQ [name] normal  - ? chornicity- pt admits sx have been present "for a long time"  - avoid narcotics.    Iron deficiency anemia Baseline Hgb appears to be 11 (Feb 2015)  - given INR >10 suspicious of occult blood loss,  CT of abdom/pelvis at admit revealed no evidence of RPH  - has remained stable. Continue iron  Atrial fibrillation Chronic;coreg for rate, coumadin as prophylaxis; was supratheraputic on admit and Vit k administered  COMBINED HEART FAILURE, CHRONIC TTE 04/2013; EF 50-55%; coreg, lasix with zaroxyln and ACE    Hennie Duos, MD

## 2013-06-14 NOTE — Assessment & Plan Note (Addendum)
-   Mental status waxed/wained after initial admit - no clear etiology was able to be found.  - ABG unrevealing - CT head unrevealing  - Repeat UA was unrevealing, UC no growth  - TSH normal.  - Stable for d/c. ? AKI.  ST to eval for dementia

## 2013-06-14 NOTE — Assessment & Plan Note (Signed)
CT abd with no specific abnormality noted  - RUQ [name] normal  - ? chornicity- pt admits sx have been present "for a long time"  - avoid narcotics.

## 2013-06-14 NOTE — Assessment & Plan Note (Signed)
Chronic;coreg for rate, coumadin as prophylaxis; was supratheraputic on admit and Vit k administered

## 2013-06-14 NOTE — Assessment & Plan Note (Signed)
Baseline Hgb appears to be 11 (Feb 2015)  - given INR >10 suspicious of occult blood loss, CT of abdom/pelvis at admit revealed no evidence of RPH  - has remained stable. Continue iron

## 2013-06-18 ENCOUNTER — Telehealth: Payer: Self-pay

## 2013-06-18 ENCOUNTER — Non-Acute Institutional Stay (SKILLED_NURSING_FACILITY): Payer: Medicare Other | Admitting: Internal Medicine

## 2013-06-18 ENCOUNTER — Encounter: Payer: Self-pay | Admitting: Internal Medicine

## 2013-06-18 DIAGNOSIS — I10 Essential (primary) hypertension: Secondary | ICD-10-CM

## 2013-06-18 DIAGNOSIS — I5042 Chronic combined systolic (congestive) and diastolic (congestive) heart failure: Secondary | ICD-10-CM

## 2013-06-18 DIAGNOSIS — R0902 Hypoxemia: Secondary | ICD-10-CM

## 2013-06-18 DIAGNOSIS — J9610 Chronic respiratory failure, unspecified whether with hypoxia or hypercapnia: Secondary | ICD-10-CM

## 2013-06-18 DIAGNOSIS — G8929 Other chronic pain: Secondary | ICD-10-CM

## 2013-06-18 DIAGNOSIS — J9611 Chronic respiratory failure with hypoxia: Secondary | ICD-10-CM

## 2013-06-18 NOTE — Assessment & Plan Note (Addendum)
Pt says she hurts "all over' but cant specify any one area. Will d/c norco and continue tramadol 50 mg q 8 prn for now

## 2013-06-18 NOTE — Assessment & Plan Note (Signed)
The primary management of HTN is with CHF;see CHF

## 2013-06-18 NOTE — Progress Notes (Signed)
MRN: [id number] Name: [name]  Sex: female Age: 79 y.o. DOB: 11-Jan-1935  [place] #: [name] Facility/Room: 301B Level Of Care: SNF Provider: Hennie Duos Emergency Contacts: Extended Emergency Contact Information Primary Emergency Contact: [name],Samantha Address: [place], [place] [address] [name] of [place] Phone: [phone] Work Phone: [phone] Relation: Son Secondary Emergency Contact: [name],Samantha          [name] Samantha, [place] of [place] Phone: [phone] Relation: Daughter  Code Status: FULL  Allergies: Penicillins; Tetanus-diphtheria toxoids td; Latex; and Levaquin  Chief Complaint  Patient presents with  . Medical Management of Chronic Issues    HPI: Patient is 79 y.o. female who is being seen for reports of low BP.  Past Medical History  Diagnosis Date  . HTN (hypertension)   . Diverticulosis   . Hemorrhoid   . Osteoarthritis   . Constipation, chronic   . H/O: hysterectomy   . Systolic and diastolic CHF, chronic     a. Echo 07/2011: Technically limited; inferior HK, inferolateral and possibly lateral wall HK, EF 40%, moderate LVE, aortic sclerosis without stenosis, mild MR, mild LAE, question RV dysfunction;   b.  Echo (04/2013): EF 50-55%, normal wall motion, moderate MR, mild to moderate LAE, PASP 37  . Female bladder prolapse     with prolapse uterus, cystocele, s/p TVH/BSO 12/2006  . Obesity (BMI 30-39.9)     wt. 94.1 kg 12/2010  . Moderate mitral regurgitation     a. Mod to Sev by echo 01/2009;  b. mild MR by echo 2013  . Atrial fibrillation 10/13/2011    coumadin Rx  . Iron deficiency anemia     Past Surgical History  Procedure Laterality Date  . Wrist surgery    . Vaginal hysterectomy,cystocele and prolapse  repair  NOV. 2008  . Abdominal hysterectomy        Medication List       This list is accurate as of: 06/18/13  9:57 PM.  Always use your most recent med list.                bisacodyl 5 MG EC tablet  Commonly known as:  DULCOLAX  Take 5 mg by mouth daily. For     carvedilol 6.25 MG tablet  Commonly known as:  COREG  take 1 tablet by mouth twice a day with food     FERRETTS 325 (106 FE) MG Tabs tablet  Generic drug:  ferrous fumarate  Take 1 tablet by mouth 2 (two) times daily.     furosemide 80 MG tablet  Commonly known as:  LASIX  Take 80mg  in the am, 40mg  in the pm     HYDROcodone-acetaminophen 5-325 MG per tablet  Commonly known as:  NORCO/VICODIN  Take 1 tablet by mouth every 8 hours as needed for pain *DO NOT EXCEED 3 GM APAP/24HR*     lisinopril 5 MG tablet  Commonly known as:  PRINIVIL,ZESTRIL  take 1 tablet by mouth once daily     metolazone 5 MG tablet  Commonly known as:  ZAROXOLYN  Take 5 mg by mouth as needed. Take with morning dose of lasix     traMADol 50 MG tablet  Commonly known as:  ULTRAM  Take 50 mg by mouth every 8 (eight) hours as needed for pain. For pain     warfarin 2.5 MG tablet  Commonly known as:  COUMADIN  of  1 tablet daily or as directed by coumadin clinic        No orders of the defined types were placed in this encounter.     There is no immunization history on file for this patient.  History  Substance Use Topics  . Smoking status: Never Smoker   . Smokeless tobacco: Current User    Types: Snuff     Comment: Uses Snuff regularly.  Dips several x/day.  A "large can" can last her up to 3 mos.  . Alcohol Use: No    Review of Systems  DATA OBTAINED: from patient; GENERAL: Feels well no fevers, fatigue, appetite changes SKIN: No itching, rash HEENT: No complaint RESPIRATORY: No cough, wheezing, SOB CARDIAC: No chest pain, palpitations, lower extremity edema  GI: No abdominal pain, No N/V/D or constipation, No heartburn or reflux  GU: No dysuria, frequency or urgency, or incontinence  MUSCULOSKELETAL: vague-pain all over NEUROLOGIC: No headache, dizziness or focal weakness PSYCHIATRIC: No  overt anxiety or sadness. Sleeps well.   Filed Vitals:   06/18/13 1026  BP: 95/60  Pulse: 94  Temp: 98.1 F (36.7 C)  Resp: 20    Physical Exam  GENERAL APPEARANCE: Alert, conversant. Appropriately groomed. Obese BF, No acute distress  SKIN: No diaphoresis rash, or wounds HEENT: Unremarkable RESPIRATORY: Breathing is even, unlabored. Lung sounds are clear   CARDIOVASCULAR: Heart RRR no murmurs, rubs or gallops. No peripheral edema  GASTROINTESTINAL: Abdomen is soft, non-tender, not distended w/ normal bowel sounds.  GENITOURINARY: Bladder non tender, not distended  MUSCULOSKELETAL: No abnormal joints or musculature NEUROLOGIC: Cranial nerves 2-12 grossly intact. Moves all extremities no tremor. PSYCHIATRIC: Mood and affect appropriate to situation, no behavioral issues  Patient Active Problem List   Diagnosis Date Noted  . Chronic respiratory failure with hypoxia 06/18/2013  . Acute encephalopathy 06/07/2013  . Diastolic CHF 62/83/1517  . Pulmonary hypertension 06/05/2013  . Weakness 06/03/2013  . Encounter for therapeutic drug monitoring 04/10/2013  . Long term current use of anticoagulant 10/26/2011  . Hypokalemia 10/15/2011  . Abdominal pain 10/15/2011  . Iron deficiency anemia 10/15/2011  . Bladder prolapse, female, acquired 10/15/2011  . Atrial fibrillation 10/15/2011  . Acute on chronic combined systolic and diastolic congestive heart failure 07/22/2011  . Facial edema 12/30/2010  . Leukopenia 12/30/2010  . Neutropenia 12/30/2010  . Abdominal pain, unspecified site 12/27/2010  . Chronic pain 12/25/2010  . Hematochezia 12/25/2010  . Dehydration 12/25/2010  . Thrombocytosis 12/25/2010  . Hyponatremia 12/25/2010  . Systolic and diastolic CHF, chronic   . Female bladder prolapse   . Mitral regurgitation 06/04/2010  . HYPOPOTASSEMIA 09/24/2009  . COMBINED HEART FAILURE, CHRONIC 09/09/2009  . CARDIOMYOPATHY 03/04/2009  . ANASARCA 03/04/2009  . HYPERTENSION  02/20/2009  . CONSTIPATION, CHRONIC 02/20/2009  . DIVERTICULOSIS 02/20/2009    CBC    Component Value Date/Time   WBC 6.2 06/07/2013 0540   RBC 2.74* 06/07/2013 0540   RBC 3.20* 06/04/2013 1120   HGB 8.3* 06/07/2013 0540   HCT 26.5* 06/07/2013 0540   PLT 311 06/07/2013 0540   MCV 96.7 06/07/2013 0540   LYMPHSABS 1.0 06/03/2013 1135   MONOABS 0.6 06/03/2013 1135   EOSABS 0.0 06/03/2013 1135   BASOSABS 0.0 06/03/2013 1135    CMP     Component Value Date/Time   NA 135* 06/07/2013 0540   K 4.0 06/07/2013 0540   CL 99 06/07/2013 0540   CO2 24 06/07/2013 0540   GLUCOSE 82 06/07/2013 0540   BUN  22 06/07/2013 0540   CREATININE 1.10 06/07/2013 0540   CALCIUM 7.6* 06/07/2013 0540   PROT 5.9* 06/07/2013 0540   ALBUMIN 2.8* 06/07/2013 0540   AST 8 06/07/2013 0540   ALT 17 06/07/2013 0540   ALKPHOS 104 06/07/2013 0540   BILITOT 0.3 06/07/2013 0540   GFRNONAA 48* 06/07/2013 0540   GFRAA 55* 06/07/2013 0540    Assessment and Plan  HYPERTENSION The primary management of HTN is with CHF;see CHF  COMBINED HEART FAILURE, CHRONIC Pt's BP has been low and meds held, the nurse thinks lisinopril; will d/c lisiopril; will check BMP to see if getting dry on her Lasix  Chronic pain Pt says she hurts "all over' but cant specify any one area. Will d/c norco and continue tramadol 50 mg q 8 prn for now  Chronic respiratory failure with hypoxia Pt on continuous O2 2L but over weekend sats dropped to 87-88%; no wheezing , no congestion; 2 potential triggers-one is low BP, lisinopril d/c and the other may be narcotcs so Norco d/c and will monitor    Hennie Duos, MD

## 2013-06-18 NOTE — Assessment & Plan Note (Signed)
Pt on continuous O2 2L but over weekend sats dropped to 87-88%; no wheezing , no congestion; 2 potential triggers-one is low BP, lisinopril d/c and the other may be narcotcs so Norco d/c and will monitor

## 2013-06-18 NOTE — Telephone Encounter (Signed)
Pt is presently residing at [facility] for rehab.  Pt's Coumadin is being monitored and managed by their in house physician at the present.  Advised I will cancel today's appt since they are managing and monitoring pt's Coumadin while she is there.  Advised to have pt's daughter call to reschedule appt once pt is discharged.  Verbalized understanding.

## 2013-06-18 NOTE — Assessment & Plan Note (Signed)
Pt's BP has been low and meds held, the nurse thinks lisinopril; will d/c lisiopril; will check BMP to see if getting dry on her Lasix

## 2013-06-19 ENCOUNTER — Emergency Department (HOSPITAL_COMMUNITY): Payer: Medicare Other

## 2013-06-19 ENCOUNTER — Inpatient Hospital Stay (HOSPITAL_COMMUNITY)
Admission: EM | Admit: 2013-06-19 | Discharge: 2013-06-26 | DRG: 871 | Disposition: A | Discharge status: Skilled Nursing Facility | Payer: Medicare Other | Attending: Internal Medicine | Admitting: Internal Medicine

## 2013-06-19 ENCOUNTER — Non-Acute Institutional Stay (SKILLED_NURSING_FACILITY): Payer: Medicare Other | Admitting: Nurse Practitioner

## 2013-06-19 ENCOUNTER — Encounter (HOSPITAL_COMMUNITY): Payer: Self-pay | Admitting: Emergency Medicine

## 2013-06-19 ENCOUNTER — Other Ambulatory Visit: Payer: Self-pay | Admitting: *Deleted

## 2013-06-19 ENCOUNTER — Telehealth: Payer: Self-pay | Admitting: Cardiology

## 2013-06-19 DIAGNOSIS — Z887 Allergy status to serum and vaccine status: Secondary | ICD-10-CM

## 2013-06-19 DIAGNOSIS — Z880 Allergy status to penicillin: Secondary | ICD-10-CM

## 2013-06-19 DIAGNOSIS — Z6841 Body Mass Index (BMI) 40.0 and over, adult: Secondary | ICD-10-CM

## 2013-06-19 DIAGNOSIS — F17200 Nicotine dependence, unspecified, uncomplicated: Secondary | ICD-10-CM | POA: Diagnosis present

## 2013-06-19 DIAGNOSIS — J189 Pneumonia, unspecified organism: Secondary | ICD-10-CM | POA: Diagnosis present

## 2013-06-19 DIAGNOSIS — R609 Edema, unspecified: Secondary | ICD-10-CM

## 2013-06-19 DIAGNOSIS — Z79899 Other long term (current) drug therapy: Secondary | ICD-10-CM

## 2013-06-19 DIAGNOSIS — E860 Dehydration: Secondary | ICD-10-CM

## 2013-06-19 DIAGNOSIS — G929 Unspecified toxic encephalopathy: Secondary | ICD-10-CM | POA: Diagnosis present

## 2013-06-19 DIAGNOSIS — K625 Hemorrhage of anus and rectum: Secondary | ICD-10-CM | POA: Diagnosis present

## 2013-06-19 DIAGNOSIS — I5030 Unspecified diastolic (congestive) heart failure: Secondary | ICD-10-CM

## 2013-06-19 DIAGNOSIS — I959 Hypotension, unspecified: Secondary | ICD-10-CM

## 2013-06-19 DIAGNOSIS — N8110 Cystocele, unspecified: Secondary | ICD-10-CM

## 2013-06-19 DIAGNOSIS — I272 Pulmonary hypertension, unspecified: Secondary | ICD-10-CM

## 2013-06-19 DIAGNOSIS — T3995XA Adverse effect of unspecified nonopioid analgesic, antipyretic and antirheumatic, initial encounter: Secondary | ICD-10-CM

## 2013-06-19 DIAGNOSIS — D473 Essential (hemorrhagic) thrombocythemia: Secondary | ICD-10-CM | POA: Diagnosis present

## 2013-06-19 DIAGNOSIS — T398X5A Adverse effect of other nonopioid analgesics and antipyretics, not elsewhere classified, initial encounter: Secondary | ICD-10-CM | POA: Diagnosis not present

## 2013-06-19 DIAGNOSIS — Z833 Family history of diabetes mellitus: Secondary | ICD-10-CM

## 2013-06-19 DIAGNOSIS — I2789 Other specified pulmonary heart diseases: Secondary | ICD-10-CM | POA: Diagnosis present

## 2013-06-19 DIAGNOSIS — IMO0002 Reserved for concepts with insufficient information to code with codable children: Secondary | ICD-10-CM

## 2013-06-19 DIAGNOSIS — A419 Sepsis, unspecified organism: Principal | ICD-10-CM | POA: Diagnosis present

## 2013-06-19 DIAGNOSIS — Z823 Family history of stroke: Secondary | ICD-10-CM

## 2013-06-19 DIAGNOSIS — Z806 Family history of leukemia: Secondary | ICD-10-CM

## 2013-06-19 DIAGNOSIS — I5042 Chronic combined systolic (congestive) and diastolic (congestive) heart failure: Secondary | ICD-10-CM | POA: Diagnosis present

## 2013-06-19 DIAGNOSIS — R0902 Hypoxemia: Secondary | ICD-10-CM

## 2013-06-19 DIAGNOSIS — R0602 Shortness of breath: Secondary | ICD-10-CM

## 2013-06-19 DIAGNOSIS — G92 Toxic encephalopathy: Secondary | ICD-10-CM | POA: Diagnosis present

## 2013-06-19 DIAGNOSIS — K922 Gastrointestinal hemorrhage, unspecified: Secondary | ICD-10-CM

## 2013-06-19 DIAGNOSIS — R600 Localized edema: Secondary | ICD-10-CM

## 2013-06-19 DIAGNOSIS — I5043 Acute on chronic combined systolic (congestive) and diastolic (congestive) heart failure: Secondary | ICD-10-CM

## 2013-06-19 DIAGNOSIS — E876 Hypokalemia: Secondary | ICD-10-CM

## 2013-06-19 DIAGNOSIS — Z5181 Encounter for therapeutic drug level monitoring: Secondary | ICD-10-CM

## 2013-06-19 DIAGNOSIS — Z91040 Latex allergy status: Secondary | ICD-10-CM

## 2013-06-19 DIAGNOSIS — N390 Urinary tract infection, site not specified: Secondary | ICD-10-CM | POA: Diagnosis present

## 2013-06-19 DIAGNOSIS — G8929 Other chronic pain: Secondary | ICD-10-CM | POA: Diagnosis present

## 2013-06-19 DIAGNOSIS — I4891 Unspecified atrial fibrillation: Secondary | ICD-10-CM | POA: Diagnosis present

## 2013-06-19 DIAGNOSIS — J9610 Chronic respiratory failure, unspecified whether with hypoxia or hypercapnia: Secondary | ICD-10-CM

## 2013-06-19 DIAGNOSIS — J9611 Chronic respiratory failure with hypoxia: Secondary | ICD-10-CM

## 2013-06-19 DIAGNOSIS — R21 Rash and other nonspecific skin eruption: Secondary | ICD-10-CM

## 2013-06-19 DIAGNOSIS — Z7901 Long term (current) use of anticoagulants: Secondary | ICD-10-CM

## 2013-06-19 DIAGNOSIS — D72819 Decreased white blood cell count, unspecified: Secondary | ICD-10-CM

## 2013-06-19 DIAGNOSIS — Y9210 Unspecified residential institution as the place of occurrence of the external cause: Secondary | ICD-10-CM | POA: Diagnosis not present

## 2013-06-19 DIAGNOSIS — Z881 Allergy status to other antibiotic agents status: Secondary | ICD-10-CM

## 2013-06-19 DIAGNOSIS — D638 Anemia in other chronic diseases classified elsewhere: Secondary | ICD-10-CM | POA: Diagnosis present

## 2013-06-19 DIAGNOSIS — Z8719 Personal history of other diseases of the digestive system: Secondary | ICD-10-CM

## 2013-06-19 DIAGNOSIS — D509 Iron deficiency anemia, unspecified: Secondary | ICD-10-CM

## 2013-06-19 DIAGNOSIS — I509 Heart failure, unspecified: Secondary | ICD-10-CM

## 2013-06-19 DIAGNOSIS — Z8249 Family history of ischemic heart disease and other diseases of the circulatory system: Secondary | ICD-10-CM

## 2013-06-19 DIAGNOSIS — I428 Other cardiomyopathies: Secondary | ICD-10-CM

## 2013-06-19 DIAGNOSIS — N179 Acute kidney failure, unspecified: Secondary | ICD-10-CM | POA: Diagnosis present

## 2013-06-19 DIAGNOSIS — R109 Unspecified abdominal pain: Secondary | ICD-10-CM

## 2013-06-19 DIAGNOSIS — D75839 Thrombocytosis, unspecified: Secondary | ICD-10-CM

## 2013-06-19 DIAGNOSIS — K921 Melena: Secondary | ICD-10-CM

## 2013-06-19 DIAGNOSIS — G9340 Encephalopathy, unspecified: Secondary | ICD-10-CM

## 2013-06-19 DIAGNOSIS — K5909 Other constipation: Secondary | ICD-10-CM

## 2013-06-19 DIAGNOSIS — R5381 Other malaise: Secondary | ICD-10-CM | POA: Diagnosis present

## 2013-06-19 DIAGNOSIS — I10 Essential (primary) hypertension: Secondary | ICD-10-CM | POA: Diagnosis present

## 2013-06-19 DIAGNOSIS — E871 Hypo-osmolality and hyponatremia: Secondary | ICD-10-CM | POA: Diagnosis present

## 2013-06-19 DIAGNOSIS — R531 Weakness: Secondary | ICD-10-CM

## 2013-06-19 DIAGNOSIS — I340 Nonrheumatic mitral (valve) insufficiency: Secondary | ICD-10-CM

## 2013-06-19 LAB — CBC WITH DIFFERENTIAL/PLATELET
BASOS PCT: 0 % (ref 0–1)
Basophils Absolute: 0 10*3/uL (ref 0.0–0.1)
EOS PCT: 2 % (ref 0–5)
Eosinophils Absolute: 0.2 10*3/uL (ref 0.0–0.7)
HCT: 27.5 % — ABNORMAL LOW (ref 36.0–46.0)
HEMOGLOBIN: 9 g/dL — AB (ref 12.0–15.0)
LYMPHS PCT: 8 % — AB (ref 12–46)
Lymphs Abs: 0.6 10*3/uL — ABNORMAL LOW (ref 0.7–4.0)
MCH: 30.2 pg (ref 26.0–34.0)
MCHC: 32.7 g/dL (ref 30.0–36.0)
MCV: 92.3 fL (ref 78.0–100.0)
MONOS PCT: 5 % (ref 3–12)
Monocytes Absolute: 0.4 10*3/uL (ref 0.1–1.0)
NEUTROS PCT: 85 % — AB (ref 43–77)
Neutro Abs: 6.8 10*3/uL (ref 1.7–7.7)
PLATELETS: 433 10*3/uL — AB (ref 150–400)
RBC: 2.98 MIL/uL — AB (ref 3.87–5.11)
RDW: 13.5 % (ref 11.5–15.5)
WBC: 8 10*3/uL (ref 4.0–10.5)

## 2013-06-19 LAB — URINALYSIS, ROUTINE W REFLEX MICROSCOPIC
Bilirubin Urine: NEGATIVE
GLUCOSE, UA: NEGATIVE mg/dL
Ketones, ur: NEGATIVE mg/dL
Nitrite: NEGATIVE
PH: 6.5 (ref 5.0–8.0)
Protein, ur: NEGATIVE mg/dL
Specific Gravity, Urine: 1.009 (ref 1.005–1.030)
Urobilinogen, UA: 0.2 mg/dL (ref 0.0–1.0)

## 2013-06-19 LAB — PROTIME-INR
INR: 3.04 — ABNORMAL HIGH (ref 0.00–1.49)
PROTHROMBIN TIME: 30.4 s — AB (ref 11.6–15.2)

## 2013-06-19 LAB — COMPREHENSIVE METABOLIC PANEL
ALT: 7 U/L (ref 0–35)
AST: 18 U/L (ref 0–37)
Albumin: 2.7 g/dL — ABNORMAL LOW (ref 3.5–5.2)
Alkaline Phosphatase: 112 U/L (ref 39–117)
BILIRUBIN TOTAL: 0.3 mg/dL (ref 0.3–1.2)
BUN: 52 mg/dL — AB (ref 6–23)
CALCIUM: 7.4 mg/dL — AB (ref 8.4–10.5)
CHLORIDE: 83 meq/L — AB (ref 96–112)
CO2: 30 meq/L (ref 19–32)
CREATININE: 1.43 mg/dL — AB (ref 0.50–1.10)
GFR, EST AFRICAN AMERICAN: 40 mL/min — AB (ref >90)
GFR, EST NON AFRICAN AMERICAN: 35 mL/min — AB (ref >90)
Glucose, Bld: 91 mg/dL (ref 70–99)
Potassium: 3.9 mEq/L (ref 3.7–5.3)
Sodium: 124 mEq/L — ABNORMAL LOW (ref 137–147)
Total Protein: 6.6 g/dL (ref 6.0–8.3)

## 2013-06-19 LAB — URINE MICROSCOPIC-ADD ON

## 2013-06-19 LAB — TROPONIN I: Troponin I: <0.3 ng/mL (ref <0.30)

## 2013-06-19 LAB — I-STAT CG4 LACTIC ACID, ED: LACTIC ACID, VENOUS: 0.68 mmol/L (ref 0.5–2.2)

## 2013-06-19 LAB — POC OCCULT BLOOD, ED: Fecal Occult Bld: NEGATIVE

## 2013-06-19 LAB — CK: CK TOTAL: 61 U/L (ref 7–177)

## 2013-06-19 MED ORDER — TRAMADOL HCL 50 MG PO TABS: 50.0000 mg | ORAL_TABLET | Freq: Three times a day (TID) | ORAL | Status: DC | PRN

## 2013-06-19 MED ORDER — DOXYCYCLINE HYCLATE 100 MG IV SOLR
100.0000 mg | Freq: Two times a day (BID) | INTRAVENOUS | Status: DC
  Filled 2013-06-19 (×2): qty 100

## 2013-06-19 MED ORDER — SODIUM CHLORIDE 0.9 % IV BOLUS (SEPSIS)
1000.0000 mL | Freq: Once | INTRAVENOUS | Status: AC
Start: 2013-06-19 — End: 2013-06-19
  Administered 2013-06-19: 1000 mL via INTRAVENOUS

## 2013-06-19 MED ORDER — FENTANYL CITRATE 0.05 MG/ML IJ SOLN
100.0000 ug | Freq: Once | INTRAMUSCULAR | Status: AC
  Administered 2013-06-19: 100 ug via INTRAVENOUS
  Filled 2013-06-19: qty 2

## 2013-06-19 NOTE — ED Notes (Signed)
This RN present to assist Dr. Winfred Leeds with Femoral Vein blood draw.

## 2013-06-19 NOTE — Progress Notes (Signed)
Patient ID: [name], female   DOB: [birth year], 79 y.o.   MRN: [id number]    Nursing Home Location:  [facility]   Place of Service: SNF (45)  PCP: Osborne Casco, MD  Allergies  Allergen Reactions  . Penicillins Other (See Comments)    unknown  . Tetanus-Diphtheria Toxoids Td Other (See Comments)    unknown  . Latex Hives and Rash  . Levaquin [Levofloxacin In D5w] Rash    Chief Complaint  Patient presents with  . Acute Visit    HPI:  79 year old female being seen today for acute visit at the request of staff due to rash on bilateral arms and chest. Pt with pmh of CHF, OA, CKD, Chronic respiratory failure with hypoxia, HTN who was admitted for acute encephalopathy. Pt was seen yesterday for low blood pressure, and increase shortness of breath, drop is sats requiring o2, medications were adjusted. However, today she is still short of breath with low BP and reports pain all over due to rash. Denies itching but reports pain. Recent labs reviewed and she is now hyponatremic, hypokalemic along with hypotensive.   Review of Systems:  Review of Systems  Constitutional: Negative for fever and chills.  Respiratory: Positive for shortness of breath. Negative for cough, sputum production and wheezing.   Cardiovascular: Negative for chest pain and palpitations.  Gastrointestinal: Negative for diarrhea and constipation.  Genitourinary: Negative for dysuria.  Skin: Positive for rash (on bilateral arms and chest).  Neurological: Negative for dizziness and headaches.     Past Medical History  Diagnosis Date  . HTN (hypertension)   . Diverticulosis   . Hemorrhoid   . Osteoarthritis   . Constipation, chronic   . H/O: hysterectomy   . Systolic and diastolic CHF, chronic     a. Echo 07/2011: Technically limited; inferior HK, inferolateral and possibly lateral wall HK, EF 40%, moderate LVE, aortic sclerosis without stenosis, mild MR, mild LAE, question RV  dysfunction;   b.  Echo (04/2013): EF 50-55%, normal wall motion, moderate MR, mild to moderate LAE, PASP 37  . Female bladder prolapse     with prolapse uterus, cystocele, s/p TVH/BSO 12/2006  . Obesity (BMI 30-39.9)     wt. 94.1 kg 12/2010  . Moderate mitral regurgitation     a. Mod to Sev by echo 01/2009;  b. mild MR by echo 2013  . Atrial fibrillation 10/13/2011    coumadin Rx  . Iron deficiency anemia    Past Surgical History  Procedure Laterality Date  . Wrist surgery    . Vaginal hysterectomy,cystocele and prolapse  repair  NOV. 2008  . Abdominal hysterectomy     Social History:   reports that she has never smoked. Her smokeless tobacco use includes Snuff. She reports that she does not drink alcohol or use illicit drugs.  Family History  Problem Relation Age of Onset  . Diabetes Brother   . Hypertension Mother     deceased @ 57  . Leukemia Brother   . Cancer Brother   . Stroke Mother   . Diabetes Father     deceased in his 22's    Medications: Patient's Medications  New Prescriptions   No medications on file  Previous Medications   BISACODYL (DULCOLAX) 5 MG EC TABLET    Take 5 mg by mouth daily. For   CARVEDILOL (COREG) 6.25 MG TABLET    take 1 tablet by mouth twice a day with food  FERROUS FUMARATE (FERRETTS) 325 (106 FE) MG TABS    Take 1 tablet by mouth 2 (two) times daily.    FUROSEMIDE (LASIX) 80 MG TABLET    Take 80mg  in the am, 40mg  in the pm   METOLAZONE (ZAROXOLYN) 5 MG TABLET    Take 5 mg by mouth as needed. Take with morning dose of lasix   TRAMADOL (ULTRAM) 50 MG TABLET    Take 1 tablet (50 mg total) by mouth every 8 (eight) hours as needed. For pain   WARFARIN (COUMADIN) 2.5 MG TABLET    of 1 tablet daily or as directed by coumadin clinic  Modified Medications   No medications on file  Discontinued Medications   HYDROCODONE-ACETAMINOPHEN (NORCO/VICODIN) 5-325 MG PER TABLET    Take 1 tablet by mouth every 8 hours as needed for pain *DO NOT EXCEED 3 GM  APAP/24HR*   LISINOPRIL (PRINIVIL,ZESTRIL) 5 MG TABLET    take 1 tablet by mouth once daily     Physical Exam: Filed Vitals:   06/19/13 1709  BP: 86/50  Pulse: 56  Temp: 98 F (36.7 C)  Resp: 22    Physical Exam  HENT:  Head: Normocephalic and atraumatic.  Neck: Normal range of motion. Neck supple.  Cardiovascular: Normal rate.   Pulmonary/Chest: She is in respiratory distress (pursed lip breathing).  Diminished breath sounds throughout  Abdominal: Soft. Bowel sounds are normal.  Musculoskeletal: She exhibits edema. She exhibits no tenderness.  Neurological: She is alert.  Skin: Skin is warm and dry. Rash (a few random vesicles noted to both arm and upper chest area) noted. She is not diaphoretic.     Labs reviewed: Basic Metabolic Panel:  Recent Labs  06/03/13 1825  06/05/13 0242 06/05/13 1200 06/06/13 0233 06/07/13 0540  NA  --   < > 130*  --  129* 135*  K  --   < > 3.5* 4.1 3.8 4.0  CL  --   < > 93*  --  96 99  CO2  --   < > 24  --  22 24  GLUCOSE  --   < > 103*  --  90 82  BUN  --   < > 35*  --  29* 22  CREATININE  --   < > 1.74*  --  1.45* 1.10  CALCIUM  --   < > 7.4*  --  7.4* 7.6*  MG 2.6*  --   --  2.0 1.9  --   PHOS 3.2  --   --   --   --   --   < > = values in this interval not displayed. Liver Function Tests:  Recent Labs  06/03/13 1135 06/05/13 0242 06/07/13 0540  AST 49* 15 8  ALT 41* 29 17  ALKPHOS 153* 109 104  BILITOT 0.6 0.5 0.3  PROT 7.7 6.1 5.9*  ALBUMIN 3.8 3.0* 2.8*    Recent Labs  06/25/12 2022 06/03/13 1135  LIPASE 21 9*    Recent Labs  06/04/13 1120  AMMONIA 65*   CBC:  Recent Labs  06/25/12 2022  03/14/13 1208 06/03/13 1135  06/05/13 0242 06/06/13 0233 06/07/13 0540  WBC 4.6  < > 5.7 7.8  < > 6.8 6.2 6.2  NEUTROABS 2.9  --  4.6 6.2  --   --   --   --   HGB 13.2  < > 11.1* 9.9*  < > 8.7* 8.1* 8.3*  HCT 40.1  < >  35.2* 30.6*  < > 27.8* 26.1* 26.5*  MCV 94.1  < > 99.0 95.0  < > 97.2 97.8 96.7  PLT 345  <  > 477.0* 317  < > 280 275 311  < > = values in this interval not displayed. Cardiac Enzymes:  Recent Labs  06/25/12 2022  06/03/13 1825 06/03/13 2335 06/04/13 0333  CKTOTAL 75  --   --   --   --   TROPONINI  --   < > <0.30 <0.30 <0.30  < > = values in this interval not displayed. BNP: No components found with this basename: POCBNP,  CBG:  Recent Labs  06/06/13 2101 06/07/13 0626 06/07/13 1111  GLUCAP 92 81 99   TSH:  Recent Labs  03/14/13 1208 06/03/13 1825  TSH 1.48 2.070   A1C: Lab Results  Component Value Date   HGBA1C 5.6 06/03/2013  CBC NO Diff (Complete Blood Count)    Result: 06/18/2013 8:21 PM   ( Status: F )     C WBC 3.6   L 4.0-10.5 K/uL SLN   RBC 3.16   L 3.87-5.11 MIL/uL SLN   Hemoglobin 9.7   L 12.0-15.0 g/dL SLN   Hematocrit 29.0   L 36.0-46.0 % SLN   MCV 91.8     78.0-100.0 fL SLN   MCH 30.7     26.0-34.0 pg SLN   MCHC 33.4     30.0-36.0 g/dL SLN   RDW 14.3     11.5-15.5 % SLN   Platelet Count 435   H 150-400 K/uL SLN   Basic Metabolic Panel    Result: 06/18/2013 11:15 PM   ( Status: F )       Sodium 128   L 135-145 mEq/L SLN   Potassium 2.9   L 3.5-5.3 mEq/L SLN   Chloride 85   L 96-112 mEq/L SLN   CO2 31     19-32 mEq/L SLN   Glucose 88     70-99 mg/dL SLN   BUN 41   H 6-23 mg/dL SLN   Creatinine 1.45   H 0.50-1.10 mg/dL SLN   Calcium 7.4   L 1.9-41.7  asic Metabolic Panel    Result: 06/19/2013 3:38 PM   ( Status: F )       Sodium 123   L 135-145 mEq/L SLN   Potassium 3.0   L 3.5-5.3 mEq/L SLN   Chloride 82   L 96-112 mEq/L SLN   CO2 31     19-32 mEq/L SLN   Glucose 95     70-99 mg/dL SLN   BUN 55   H 6-23 mg/dL SLN   Creatinine 1.64   H 0.50-1.10 mg/dL SLN   Calcium 7.2   L 8.4-10.5  Assessment/Plan  Acute on chronic combined systolic and diastolic congestive heart failure Chronic respiratory failure with hypoxia Hypokalemia Hyponatremia Pt with increase shortness of breath, requiring increase in o2 and with lab abnormalities as  above, and hypotension- this pt can not be safely managed in SNF and needs acute care settings for proper labs, medications, and imaging -will sent to ED at this time.   Rash and nonspecific skin eruption Appears to be herpes simplex due to clusters of vesicles however due to the complexity and acuity of above problemx will send to the ED at this time

## 2013-06-19 NOTE — ED Provider Notes (Signed)
Discussed case with Dr. Donah Driver of care from Dr. Cathleen Fears at change in shift.  Plan: Labs pending - admit patient.   Results for orders placed during the hospital encounter of 06/19/13  COMPREHENSIVE METABOLIC PANEL      Result Value Ref Range   Sodium 124 (*) 137 - 147 mEq/L   Potassium 3.9  3.7 - 5.3 mEq/L   Chloride 83 (*) 96 - 112 mEq/L   CO2 30  19 - 32 mEq/L   Glucose, Bld 91  70 - 99 mg/dL   BUN 52 (*) 6 - 23 mg/dL   Creatinine, Ser 1.43 (*) 0.50 - 1.10 mg/dL   Calcium 7.4 (*) 8.4 - 10.5 mg/dL   Total Protein 6.6  6.0 - 8.3 g/dL   Albumin 2.7 (*) 3.5 - 5.2 g/dL   AST 18  0 - 37 U/L   ALT 7  0 - 35 U/L   Alkaline Phosphatase 112  39 - 117 U/L   Total Bilirubin 0.3  0.3 - 1.2 mg/dL   GFR calc non Af Amer 35 (*) >90 mL/min   GFR calc Af Amer 40 (*) >90 mL/min  CBC WITH DIFFERENTIAL      Result Value Ref Range   WBC 8.0  4.0 - 10.5 K/uL   RBC 2.98 (*) 3.87 - 5.11 MIL/uL   Hemoglobin 9.0 (*) 12.0 - 15.0 g/dL   HCT 27.5 (*) 36.0 - 46.0 %   MCV 92.3  78.0 - 100.0 fL   MCH 30.2  26.0 - 34.0 pg   MCHC 32.7  30.0 - 36.0 g/dL   RDW 13.5  11.5 - 15.5 %   Platelets 433 (*) 150 - 400 K/uL   Neutrophils Relative % 85 (*) 43 - 77 %   Lymphocytes Relative 8 (*) 12 - 46 %   Monocytes Relative 5  3 - 12 %   Eosinophils Relative 2  0 - 5 %   Basophils Relative 0  0 - 1 %   Neutro Abs 6.8  1.7 - 7.7 K/uL   Lymphs Abs 0.6 (*) 0.7 - 4.0 K/uL   Monocytes Absolute 0.4  0.1 - 1.0 K/uL   Eosinophils Absolute 0.2  0.0 - 0.7 K/uL   Basophils Absolute 0.0  0.0 - 0.1 K/uL  TROPONIN I      Result Value Ref Range   Troponin I <0.30  <0.30 ng/mL  URINALYSIS, ROUTINE W REFLEX MICROSCOPIC      Result Value Ref Range   Color, Urine YELLOW  YELLOW   APPearance TURBID (*) CLEAR   Specific Gravity, Urine 1.009  1.005 - 1.030   pH 6.5  5.0 - 8.0   Glucose, UA NEGATIVE  NEGATIVE mg/dL   Hgb urine dipstick MODERATE (*) NEGATIVE   Bilirubin Urine NEGATIVE  NEGATIVE   Ketones, ur  NEGATIVE  NEGATIVE mg/dL   Protein, ur NEGATIVE  NEGATIVE mg/dL   Urobilinogen, UA 0.2  0.0 - 1.0 mg/dL   Nitrite NEGATIVE  NEGATIVE   Leukocytes, UA LARGE (*) NEGATIVE  PROTIME-INR      Result Value Ref Range   Prothrombin Time 30.4 (*) 11.6 - 15.2 seconds   INR 3.04 (*) 0.00 - 1.49  CK      Result Value Ref Range   Total CK 61  7 - 177 U/L  URINE MICROSCOPIC-ADD ON      Result Value Ref Range   Squamous Epithelial / LPF RARE  RARE   WBC, UA 21-50  <  3 WBC/hpf   RBC / HPF 0-2  <3 RBC/hpf   Bacteria, UA MANY (*) RARE  I-STAT CG4 LACTIC ACID, ED      Result Value Ref Range   Lactic Acid, Venous 0.68  0.5 - 2.2 mmol/L  POC OCCULT BLOOD, ED      Result Value Ref Range   Fecal Occult Bld NEGATIVE  NEGATIVE   Ct Head Wo Contrast  06/03/2013   CLINICAL DATA:  Lethargy.  Altered mental status.  EXAM: CT HEAD WITHOUT CONTRAST  TECHNIQUE: Contiguous axial images were obtained from the base of the skull through the vertex without intravenous contrast.  COMPARISON:  None.  FINDINGS: There is no evidence of acute infarction, mass lesion, or intra- or extra-axial hemorrhage on CT.  Diffuse periventricular and subcortical white matter change likely reflects small vessel ischemic microangiopathy. Prominence of the ventricles suggests mild cortical volume loss. Mild cerebellar atrophy is noted.  The brainstem and fourth ventricle are within normal limits. The basal ganglia are unremarkable in appearance. The cerebral hemispheres demonstrate grossly normal gray-white differentiation. No mass effect or midline shift is seen.  There is no evidence of fracture; visualized osseous structures are unremarkable in appearance. The visualized portions of the orbits are within normal limits. A small mucus retention cyst or polyp is noted within the right maxillary sinus. The remaining paranasal sinuses and mastoid air cells are well-aerated. No significant soft tissue abnormalities are seen.  IMPRESSION: 1. No acute  intracranial pathology seen on CT. 2. Mild cortical volume loss and diffuse small vessel ischemic microangiopathy. 3. Small mucus retention cyst or polyp within the right maxillary sinus.   Electronically Signed   By: Garald Balding M.D.   On: 06/03/2013 21:24   Ct Abdomen Pelvis W Contrast  06/03/2013   CLINICAL DATA:  Abdominal pain and vomiting.  Weakness.  EXAM: CT ABDOMEN AND PELVIS WITH CONTRAST  TECHNIQUE: Multidetector CT imaging of the abdomen and pelvis was performed using the standard protocol following bolus administration of intravenous contrast.  CONTRAST:  80 mL Omnipaque 300  COMPARISON:  06/26/2012  FINDINGS: Minimal scarring and/or atelectasis is noted in the lung bases. Heart is incompletely visualized but appears enlarged.  Streak artifact is present in the upper abdomen due to patient's inability to raise her arms. The liver appears slightly heterogeneous in density, however this may be due to superimposed streak artifact. No definite focal liver lesion is identified. The gallbladder, spleen, right adrenal gland, and pancreas have an unremarkable enhanced appearance. There is mild diffuse thickening of the left adrenal gland. Small right lower pole renal cysts measure 1.4 and 1.7 cm, similar to the prior study. Multiple left renal cysts also do not appear significantly changed, the largest measuring 3.3 cm and arising from the lower pole. There is no hydronephrosis.  There is a small hiatal hernia. Oral contrast is present in nondilated loops of small bowel to the level the proximal colon without evidence of obstruction. There is left-sided colonic diverticulosis without evidence of diverticulitis. Appendix is not clearly identified, however no inflammatory changes are present in the right lower quadrant.  Uterus is absent. Ovaries are unremarkable. Bladder is unremarkable. Mild to moderate aortoiliac atherosclerotic calcification is noted. No free fluid or enlarged lymph nodes are  identified. Multilevel degenerative disc disease with vacuum disc phenomenon is noted in the lower thoracic spine. Mild multilevel degenerative disc disease and facet arthrosis are present in the lower lumbar spine, with unchanged grade 1 anterolisthesis of L4 on L5.  IMPRESSION: No acute abnormality identified in the abdomen or pelvis. No etiology for abdominal pain identified.   Electronically Signed   By: Logan Bores   On: 06/03/2013 15:31   Dg Chest Portable 1 View  06/19/2013   CLINICAL DATA:  Shortness of breath  EXAM: PORTABLE CHEST - 1 VIEW  COMPARISON:  06/03/2013  FINDINGS: Patchy lingular opacity, suspicious for pneumonia.  Possible mild patchy right perihilar opacity, equivocal. Eventration of the left hemidiaphragm. No pleural effusion or pneumothorax.  Cardiomegaly.  IMPRESSION: Suspected lingular pneumonia.   Electronically Signed   By: Julian Hy M.D.   On: 06/19/2013 19:21   EKG noted normal sinus rhythm with a borderline prolonged PR interval with nonspecific IVCD with LAT/LVH with secondary polarization abnormality a heart rate of 66 beats per minute. Troponin negative elevation. CBC negative elevated white blood cell count noted. CMP noted mildly elevated BUN of 52 and crab 1.43-patient has history of renal failure. Mildly low sodium of 124. Lactic acid negative elevation. Urinalysis noted moderate hemoglobin with large leukocytes with white blood cells 21-50 many bacteria noted. Fecal occult negative. INR 3.04, PTT 30.4. CK 61. Chest x-ray identified a lingular pneumonia.  Medications  carvedilol (COREG) tablet 6.25 mg (not administered)  docusate sodium (COLACE) capsule 100 mg (not administered)  lisinopril (PRINIVIL,ZESTRIL) tablet 5 mg (not administered)  warfarin (COUMADIN) tablet 2 mg (not administered)  furosemide (LASIX) tablet 40 mg (not administered)  furosemide (LASIX) tablet 80 mg (not administered)  sodium chloride 0.9 % injection 3 mL (3 mLs Intravenous Given  06/20/13 0222)  sodium chloride 0.9 % injection 3 mL (not administered)  0.9 %  sodium chloride infusion (not administered)  aztreonam (AZACTAM) 2 g in dextrose 5 % 50 mL IVPB (2 g Intravenous Given 06/20/13 0217)  vancomycin (VANCOCIN) 1,500 mg in sodium chloride 0.9 % 500 mL IVPB (1,500 mg Intravenous Given 06/20/13 0311)  Warfarin - Physician Dosing Inpatient (not administered)  naloxone [facility]) injection 0.2 mg (0.2 mg Intravenous Given 06/20/13 0626)  naloxone (NARCAN) 0.4 MG/ML injection (  Not Given 06/20/13 0630)  HYDROcodone-acetaminophen (NORCO/VICODIN) 5-325 MG per tablet 0.5 tablet (not administered)  sodium chloride 0.9 % bolus 1,000 mL (0 mLs Intravenous Stopped 06/19/13 2227)  fentaNYL (SUBLIMAZE) injection 100 mcg (100 mcg Intravenous Given 06/19/13 2039)  methylPREDNISolone sodium succinate (SOLU-MEDROL) 125 mg/2 mL injection 125 mg (125 mg Intravenous Given 06/20/13 0217)   Filed Vitals:   06/20/13 0103 06/20/13 0559 06/20/13 0606 06/20/13 0619  BP: 104/42 122/65    Pulse: 69 76    Temp: 97.5 F (36.4 C)   98.1 F (36.7 C)  TempSrc: Oral   Oral  Resp: 16 12 14    Height: 5\' 1"  (1.549 m)     Weight: 222 lb (100.699 kg)     SpO2: 100% 100%     Diagnoses that have been ruled out:  None  Diagnoses that are still under consideration:  None  Final diagnoses:  Lingular pneumonia  UTI (urinary tract infection)  Shortness of breath  Hypotension  CHF (congestive heart failure)    Patient seen and assessed by this provider regarding shortness of breath has been ongoing. Patient also seen and assessed regarding ongoing pain-that she's been using abdominal pain, last abdomen and pelvis was negative performed back in April 2015. Patient was recently just discharged secondary to supratherapeutic Coumadin with an INR of 10.1 and poor hydration. Reported cough that is productive.  Patient alert and oriented. GCS 15. Heart rate and rhythm  normal. Lungs noted to have decreased breath  sounds bilaterally. Poor lung expansion noted. Patient is able to speak in full sentences without difficulty. Negative use of accessory muscles. Patient is hooked up to nasal cannula via 2 L secondary to drop in pulse ox. Erythematous rash identified to the Center for chest is also left side of her neck gaseous been using Benadryl minimal relief. While in ED setting patient's blood pressure dropped to 86/50-patient was placed on slow titrating fluids to prevent fluid overload secondary to congestive heart failure history.  11:13 PM This provider spoke with Dr. Shanon Brow - [facility] - discussed case, history, labs, presentation great detail. Patient to be admitted to telemetry floor. Discussed with family and plan for admission-family understood and agreed to plan of care.  Jamse Mead, PA-C 06/20/13 [phone]

## 2013-06-19 NOTE — Telephone Encounter (Signed)
New message    Patient daughter calling Mother at [facility]  Upset regarding her mother's care has gotten drop.   Daughter is asking for Dr. Percival Spanish to call her to discuss this matter.    Concerns regarding her blood pressure on yesterday was wrist 70/40 , oxygen level, constance pain only can get pain medication every 8 hours.    Will have [facility] fax over week worth of blood pressure reading.

## 2013-06-19 NOTE — ED Notes (Addendum)
Pt ED via EMS with c/o intermittent SOB, sore mouth, and abdominal pain, onset about 2 weeks. Per EMS, O2-100% on 2L [place] (on 2L at skilled facility), BP-104/54. Lesions noted at upper extremities.

## 2013-06-19 NOTE — H&P (Signed)
PCP:   Osborne Casco, MD   Chief Complaint:  sob  HPI: 79 yo female snf h/o htn, chf, afib, recent inr over 89, comes in with several days of worsening sob.  Also several days of this urticarial rash on her upper chest and arms.  Denies any recent medications.  She has been sob with coughing for several days.  Denies any fevers.  No cp.  No swelling in her legs.  Cough is productive.  She had a recent hospitalization for inr over 10 and dehydration.  She has been in snf since.  No n/v/d.  She denies any recent use of abx.  She has allergies to levaquin and pcn both caused rash and tongue swelling.    Also of note, back in 2013 she had a hospitalization where she broke out in a similar rash, was told it was due to levaquin.  Derm was consulted and bx was done showed vasculitis??  Rash had markedly improved with steroids.    Review of Systems:  Positive and negative as per HPI otherwise all other systems are negative  Past Medical History: Past Medical History  Diagnosis Date  . HTN (hypertension)   . Diverticulosis   . Hemorrhoid   . Osteoarthritis   . Constipation, chronic   . H/O: hysterectomy   . Systolic and diastolic CHF, chronic     a. Echo 07/2011: Technically limited; inferior HK, inferolateral and possibly lateral wall HK, EF 40%, moderate LVE, aortic sclerosis without stenosis, mild MR, mild LAE, question RV dysfunction;   b.  Echo (04/2013): EF 50-55%, normal wall motion, moderate MR, mild to moderate LAE, PASP 37  . Female bladder prolapse     with prolapse uterus, cystocele, s/p TVH/BSO 12/2006  . Obesity (BMI 30-39.9)     wt. 94.1 kg 12/2010  . Moderate mitral regurgitation     a. Mod to Sev by echo 01/2009;  b. mild MR by echo 2013  . Atrial fibrillation 10/13/2011    coumadin Rx  . Iron deficiency anemia    Past Surgical History  Procedure Laterality Date  . Wrist surgery    . Vaginal hysterectomy,cystocele and prolapse  repair  NOV. 2008  . Abdominal  hysterectomy      Medications: Prior to Admission medications   Medication Sig Start Date End Date Taking? Authorizing Provider  Amino Acids-Protein Hydrolys (FEEDING SUPPLEMENT, PRO-STAT SUGAR FREE 64,) LIQD Take 30 mLs by mouth 2 (two) times daily. 8am and 4:30pm   Yes Historical Provider, MD  carvedilol (COREG) 6.25 MG tablet Take 6.25 mg by mouth 2 (two) times daily with a meal.   Yes Historical Provider, MD  docusate sodium (COLACE) 100 MG capsule Take 100 mg by mouth daily.   Yes Historical Provider, MD  ferrous fumarate (FERRETTS) 325 (106 FE) MG TABS Take 1 tablet by mouth 2 (two) times daily. 8am and 4pm   Yes Historical Provider, MD  furosemide (LASIX) 40 MG tablet Take 40 mg by mouth every evening. Daily at 4pm   Yes Historical Provider, MD  furosemide (LASIX) 80 MG tablet Take 80 mg by mouth daily.    Yes Historical Provider, MD  HYDROcodone-acetaminophen (NORCO/VICODIN) 5-325 MG per tablet Take 1 tablet by mouth every 8 (eight) hours as needed (pain).   Yes Historical Provider, MD  lisinopril (PRINIVIL,ZESTRIL) 5 MG tablet Take 5 mg by mouth daily.   Yes Historical Provider, MD  magnesium hydroxide (MILK OF MAGNESIA) 400 MG/5ML suspension Take 30 mLs by mouth  daily as needed for mild constipation.   Yes Historical Provider, MD  metolazone (ZAROXOLYN) 5 MG tablet Take 5 mg by mouth daily as needed (for CHF (with morning dose of Lasix)).  12/10/11  Yes Scott Joylene Draft, PA-C  traMADol (ULTRAM) 50 MG tablet Take 50 mg by mouth every 8 (eight) hours as needed (pain).   Yes Historical Provider, MD  warfarin (COUMADIN) 2 MG tablet Take 2 mg by mouth daily.    Historical Provider, MD    Allergies:   Allergies  Allergen Reactions  . Penicillins Other (See Comments)    unknown  . Tetanus-Diphtheria Toxoids Td Other (See Comments)    unknown  . Latex Hives and Rash  . Levaquin [Levofloxacin In D5w] Rash    Social History:  reports that she has never smoked. Her smokeless tobacco use  includes Snuff. She reports that she does not drink alcohol or use illicit drugs.  Family History: Family History  Problem Relation Age of Onset  . Diabetes Brother   . Hypertension Mother     deceased @ 37  . Leukemia Brother   . Cancer Brother   . Stroke Mother   . Diabetes Father     deceased in his 21's    Physical Exam: Filed Vitals:   06/19/13 2200 06/19/13 2215 06/19/13 2230 06/19/13 2245  BP: 98/47 108/60 108/54 101/49  Pulse: 72 66 77 60  Temp:      TempSrc:      Resp: 15 20 17 18   SpO2: 100% 100% 98% 97%   General appearance: alert, cooperative and no distress Head: Normocephalic, without obvious abnormality, atraumatic  Mucous membranes nml Eyes: negative Nose: Nares normal. Septum midline. Mucosa normal. No drainage or sinus tenderness. Neck: no JVD and supple, symmetrical, trachea midline Lungs: clear to auscultation bilaterally Heart: regular rate and rhythm, S1, S2 normal, no murmur, click, rub or gallop Abdomen: soft, non-tender; bowel sounds normal; no masses,  no organomegaly Extremities: extremities normal, atraumatic, no cyanosis or edema Pulses: 2+ and symmetric Skin: erythatous hives across chest and arms, some on arms starting with tense bullous lesions Neurologic: Grossly normal    Labs on Admission:   Recent Labs  06/19/13 2050  NA 124*  K 3.9  CL 83*  CO2 30  GLUCOSE 91  BUN 52*  CREATININE 1.43*  CALCIUM 7.4*    Recent Labs  06/19/13 2050  AST 18  ALT 7  ALKPHOS 112  BILITOT 0.3  PROT 6.6  ALBUMIN 2.7*    Recent Labs  06/19/13 2050  WBC 8.0  NEUTROABS 6.8  HGB 9.0*  HCT 27.5*  MCV 92.3  PLT 433*    Recent Labs  06/19/13 2050  CKTOTAL 24  TROPONINI <0.30    Radiological Exams on Admission:  Dg Chest Portable 1 View  06/19/2013   CLINICAL DATA:  Shortness of breath  EXAM: PORTABLE CHEST - 1 VIEW  COMPARISON:  06/03/2013  FINDINGS: Patchy lingular opacity, suspicious for pneumonia.  Possible mild patchy  right perihilar opacity, equivocal. Eventration of the left hemidiaphragm. No pleural effusion or pneumothorax.  Cardiomegaly.  IMPRESSION: Suspected lingular pneumonia.   Electronically Signed   By: Julian Hy M.D.   On: 06/19/2013 19:21   Assessment/Plan  79 yo female with sob due to pna with also erythmatous, urticarial bullous rash  Principal Problem:   PNA (pneumonia)-  Treat for hcap with vanc/actreonam.  pna pathway.  Blood cx done.  Active Problems:  Stable unless o/w noted  Systolic and diastolic CHF, chronic- compensated at this time   Chronic pain   Hyponatremia  Mild dehydration, hold some of her diuretics spirinolactone cont lasix   Atrial fibrillation   Long term current use of anticoagulant   Encounter for therapeutic drug monitoring   Pulmonary hypertension   Chronic respiratory failure with hypoxia   Rash and nonspecific skin eruption-  Similar to rash in 2013, will give benadryl and solumedrol iv 125 mg x 1.  ???  Family was told this was due to levaquin, but bx showed ?possible vasculitis.  Tele.  Full code.  Stefan Karen A Shanon Brow 06/19/2013, 11:50 PM

## 2013-06-19 NOTE — ED Provider Notes (Signed)
CSN: [id number]     Arrival date & time 06/19/13  1815 History   First MD Initiated Contact with Patient 06/19/13 1827     Chief Complaint  Patient presents with  . Shortness of Breath     (Consider location/radiation/quality/duration/timing/severity/associated sxs/prior Treatment) HPI Complains of "pain all over" for the past 2 weeks. Pain is worse diffusely abdomen. No vomiting no nausea. No other complaint. No treatment prior to coming here she presents today she developed a rash over her arms and chest. She was sent [facility] from [facility] where she was sent for rehabilitation admission here for similar complaint last month. She had CT scan of abdomen which was unremarkable. Patient denies chest pain denies shortness of breath. Pain is worse with changing position. Past Medical History  Diagnosis Date  . HTN (hypertension)   . Diverticulosis   . Hemorrhoid   . Osteoarthritis   . Constipation, chronic   . H/O: hysterectomy   . Systolic and diastolic CHF, chronic     a. Echo 07/2011: Technically limited; inferior HK, inferolateral and possibly lateral wall HK, EF 40%, moderate LVE, aortic sclerosis without stenosis, mild MR, mild LAE, question RV dysfunction;   b.  Echo (04/2013): EF 50-55%, normal wall motion, moderate MR, mild to moderate LAE, PASP 37  . Female bladder prolapse     with prolapse uterus, cystocele, s/p TVH/BSO 12/2006  . Obesity (BMI 30-39.9)     wt. 94.1 kg 12/2010  . Moderate mitral regurgitation     a. Mod to Sev by echo 01/2009;  b. mild MR by echo 2013  . Atrial fibrillation 10/13/2011    coumadin Rx  . Iron deficiency anemia    Past Surgical History  Procedure Laterality Date  . Wrist surgery    . Vaginal hysterectomy,cystocele and prolapse  repair  NOV. 2008  . Abdominal hysterectomy     Family History  Problem Relation Age of Onset  . Diabetes Brother   . Hypertension Mother     deceased @ 73  . Leukemia Brother   . Cancer  Brother   . Stroke Mother   . Diabetes Father     deceased in his 25's   History  Substance Use Topics  . Smoking status: Never Smoker   . Smokeless tobacco: Current User    Types: Snuff     Comment: Uses Snuff regularly.  Dips several x/day.  A "large can" can last her up to 3 mos.  . Alcohol Use: No   OB History   Grav Para Term Preterm Abortions TAB SAB Ect Mult Living   6              Review of Systems  Constitutional: Negative.   HENT: Negative.   Respiratory: Negative.   Cardiovascular: Negative.   Gastrointestinal: Positive for abdominal pain.  Musculoskeletal: Positive for myalgias.  Skin: Positive for rash.  Neurological: Negative.   Psychiatric/Behavioral: Negative.   All other systems reviewed and are negative.     Allergies  Penicillins; Tetanus-diphtheria toxoids td; Latex; and Levaquin  Home Medications   Prior to Admission medications   Medication Sig Start Date End Date Taking? Authorizing Provider  bisacodyl (DULCOLAX) 5 MG EC tablet Take 5 mg by mouth daily. For    Historical Provider, MD  carvedilol (COREG) 6.25 MG tablet take 1 tablet by mouth twice a day with food 01/16/13   Minus Breeding, MD  ferrous fumarate (FERRETTS) 325 (106 FE) MG TABS Take 1  tablet by mouth 2 (two) times daily.     Historical Provider, MD  furosemide (LASIX) 80 MG tablet Take 80mg  in the am, 40mg  in the pm 03/14/13   Liliane Shi, PA-C  metolazone (ZAROXOLYN) 5 MG tablet Take 5 mg by mouth as needed. Take with morning dose of lasix 12/10/11   Liliane Shi, PA-C  traMADol (ULTRAM) 50 MG tablet Take 1 tablet (50 mg total) by mouth every 8 (eight) hours as needed. For pain 06/19/13   Estill Dooms, MD  warfarin (COUMADIN) 2.5 MG tablet of 1 tablet daily or as directed by coumadin clinic 05/24/13   Minus Breeding, MD   BP 96/50  Pulse 63  Temp(Src) 98.4 F (36.9 C) (Oral)  Resp 19  SpO2 100% Physical Exam  Nursing note and vitals reviewed. Constitutional: She appears  distressed.  Appears uncomfortable, chronically ill-appearing  HENT:  Head: Normocephalic and atraumatic.  No mucosal lesion  Eyes: Conjunctivae are normal. Pupils are equal, round, and reactive to light.  Neck: Neck supple. No tracheal deviation present. No thyromegaly present.  Cardiovascular: Normal rate and regular rhythm.   No murmur heard. Pulmonary/Chest: Effort normal and breath sounds normal.  Abdominal: Soft. Bowel sounds are normal. She exhibits no distension. There is no tenderness.  Morbidly obese  Genitourinary: Guaiac positive stool.  Blackish stool with trace amounts of red blood. Guaiac positive  Musculoskeletal: Normal range of motion. She exhibits no edema and no tenderness.  Neurological: She is alert. No cranial nerve deficit. Coordination normal.  Skin: Skin is warm and dry. No rash noted.  Pinkish papular rash on arms and chest area . dime-sized lesions  Psychiatric: She has a normal mood and affect.    ED Course  Procedures (including critical care time) Labs Review Labs Reviewed - No data to display  Imaging Review No results found.   EKG Interpretation   Date/Time:  Tuesday Jun 19 2013 19:22:06 EDT Ventricular Rate:  66 PR Interval:  213 QRS Duration: 173 QT Interval:  461 QTC Calculation: 483 R Axis:   -71 Text Interpretation:  Sinus rhythm Borderline prolonged PR interval  Nonspecific IVCD with LAD LVH with secondary repolarization abnormality  Anterior Q waves, possibly due to LVH No significant change since last  tracing Confirmed by Winfred Leeds  MD, Iman Reinertsen [phone]) on 06/19/2013 7:24:45 PM     9 PM patient feels improved after treatment with intravenous fentanyl. She continues to deny dyspnea. MDM  Clinically I don't believe patient has pneumonia. She's had minimal to no cough. Denies shortness of breath. Afebrile. Rash may be drug rash, doubt infectious etiology. Her daughter reports she's had some arrests in the past. I am concerned about  hypotension and feel that she can withstand gentle hydration and blood pressure monitoring Final diagnoses:  None  pt signed out to Dr. Karle Starch 925 pm Diagnosis #1 hypotension #2 heme positive stool #3rash CRITICAL CARE Performed by: Orlie Dakin Total critical care time: 30 minute Critical care time was exclusive of separately billable procedures and treating other patients. Critical care was necessary to treat or prevent imminent or life-threatening deterioration. Critical care was time spent personally by me on the following activities: development of treatment plan with patient and/or surrogate as well as nursing, discussions with consultants, evaluation of patient's response to treatment, examination of patient, obtaining history from patient or surrogate, ordering and performing treatments and interventions, ordering and review of laboratory studies, ordering and review of radiographic studies, pulse oximetry and re-evaluation of  patient's condition.    Orlie Dakin, MD 06/19/13 2122

## 2013-06-19 NOTE — ED Notes (Signed)
IV insertion attempted x2 unsuccessfully, IV team paged.

## 2013-06-19 NOTE — Progress Notes (Addendum)
ANTIBIOTIC CONSULT NOTE - INITIAL  Pharmacy Consult for Doxycycline ->Vancomycin and Aztreonam Indication: pneumonia  Allergies  Allergen Reactions  . Penicillins Other (See Comments)    unknown  . Tetanus-Diphtheria Toxoids Td Other (See Comments)    unknown  . Latex Hives and Rash  . Levaquin [Levofloxacin In D5w] Rash    Patient Measurements:    Vital Signs: Temp: 99 F (37.2 C) (05/12 1900) Temp src: Rectal (05/12 1900) BP: 101/49 mmHg (05/12 2245) Pulse Rate: 60 (05/12 2245) Intake/Output from previous day:   Intake/Output from this shift: Total I/O In: -  Out: 218 [Urine:218]  Labs:  Recent Labs  06/19/13 2050  WBC 8.0  HGB 9.0*  PLT 433*  CREATININE 1.43*   The CrCl is unknown because both a height and weight (above a minimum accepted value) are required for this calculation. No results found for this basename: VANCOTROUGH, Corlis Leak, VANCORANDOM, [place], GENTPEAK, GENTRANDOM, TOBRATROUGH, TOBRAPEAK, TOBRARND, AMIKACINPEAK, AMIKACINTROU, AMIKACIN,  in the last 72 hours   Microbiology: Recent Results (from the past 720 hour(s))  MRSA PCR SCREENING     Status: None   Collection Time    06/03/13  6:18 PM      Result Value Ref Range Status   MRSA by PCR NEGATIVE  NEGATIVE Final   Comment:            The GeneXpert MRSA Assay (FDA     approved for NASAL specimens     only), is one component of a     comprehensive MRSA colonization     surveillance program. It is not     intended to diagnose MRSA     infection nor to guide or     monitor treatment for     MRSA infections.  URINE CULTURE     Status: None   Collection Time    06/03/13  8:51 PM      Result Value Ref Range Status   Specimen Description URINE, CATHETERIZED   Final   Special Requests NONE   Final   Culture  Setup Time     Final   Value: 06/03/2013 21:33     Performed at [facility]     Final   Value: NO GROWTH     Performed at [facility]   Culture     Final   Value: NO GROWTH     Performed at [facility]   Report Status 06/04/2013 FINAL   Final    Medical History: Past Medical History  Diagnosis Date  . HTN (hypertension)   . Diverticulosis   . Hemorrhoid   . Osteoarthritis   . Constipation, chronic   . H/O: hysterectomy   . Systolic and diastolic CHF, chronic     a. Echo 07/2011: Technically limited; inferior HK, inferolateral and possibly lateral wall HK, EF 40%, moderate LVE, aortic sclerosis without stenosis, mild MR, mild LAE, question RV dysfunction;   b.  Echo (04/2013): EF 50-55%, normal wall motion, moderate MR, mild to moderate LAE, PASP 37  . Female bladder prolapse     with prolapse uterus, cystocele, s/p TVH/BSO 12/2006  . Obesity (BMI 30-39.9)     wt. 94.1 kg 12/2010  . Moderate mitral regurgitation     a. Mod to Sev by echo 01/2009;  b. mild MR by echo 2013  . Atrial fibrillation 10/13/2011    coumadin Rx  . Iron deficiency anemia     Medications:  See  electronic med rec  Assessment: 79 y.o. female presents from SNF with SOB. Abx changed to Vanc and Zosyn for HCAP. Noted pt also with rash on arms and chest. Noted h/o rash/tongue swelling with levaquin and PCN - pt denies recent h/o abx use.  Goal of Therapy:  Vancomycin trough 15-20 mcg/ml  Plan:  1) Vancomycin 1.5gm IV q24h 2) Continue Aztreonam 2gm IV q8h 3) Will f/u renal function, pt's clinical condition, micro data, vanc trough at [facility]  Sherlon Handing, PharmD, BCPS Clinical pharmacist, pager [phone] 06/19/2013,11:41 PM

## 2013-06-19 NOTE — Telephone Encounter (Signed)
Left pt's daughter a message to call back.

## 2013-06-19 NOTE — Telephone Encounter (Signed)
[facility] 

## 2013-06-20 ENCOUNTER — Inpatient Hospital Stay (HOSPITAL_COMMUNITY): Payer: Medicare Other

## 2013-06-20 ENCOUNTER — Encounter (HOSPITAL_COMMUNITY): Payer: Self-pay | Admitting: General Practice

## 2013-06-20 DIAGNOSIS — G9340 Encephalopathy, unspecified: Secondary | ICD-10-CM

## 2013-06-20 DIAGNOSIS — E871 Hypo-osmolality and hyponatremia: Secondary | ICD-10-CM

## 2013-06-20 LAB — BLOOD GAS, ARTERIAL
ACID-BASE EXCESS: 5.2 mmol/L — AB (ref 0.0–2.0)
BICARBONATE: 29.5 meq/L — AB (ref 20.0–24.0)
Drawn by: 252031
O2 Content: 2 L/min
O2 Saturation: 98.7 %
PCO2 ART: 46 mmHg — AB (ref 35.0–45.0)
PO2 ART: 114 mmHg — AB (ref 80.0–100.0)
Patient temperature: 98.6
TCO2: 30.9 mmol/L (ref 0–100)
pH, Arterial: 7.423 (ref 7.350–7.450)

## 2013-06-20 LAB — CBC WITH DIFFERENTIAL/PLATELET
BASOS ABS: 0 10*3/uL (ref 0.0–0.1)
BASOS PCT: 0 % (ref 0–1)
EOS ABS: 0.1 10*3/uL (ref 0.0–0.7)
Eosinophils Relative: 1 % (ref 0–5)
HEMATOCRIT: 27.9 % — AB (ref 36.0–46.0)
Hemoglobin: 9 g/dL — ABNORMAL LOW (ref 12.0–15.0)
Lymphocytes Relative: 7 % — ABNORMAL LOW (ref 12–46)
Lymphs Abs: 0.5 10*3/uL — ABNORMAL LOW (ref 0.7–4.0)
MCH: 30 pg (ref 26.0–34.0)
MCHC: 32.3 g/dL (ref 30.0–36.0)
MCV: 93 fL (ref 78.0–100.0)
MONO ABS: 0.2 10*3/uL (ref 0.1–1.0)
Monocytes Relative: 3 % (ref 3–12)
NEUTROS ABS: 6.1 10*3/uL (ref 1.7–7.7)
Neutrophils Relative %: 89 % — ABNORMAL HIGH (ref 43–77)
Platelets: 430 10*3/uL — ABNORMAL HIGH (ref 150–400)
RBC: 3 MIL/uL — ABNORMAL LOW (ref 3.87–5.11)
RDW: 13.8 % (ref 11.5–15.5)
WBC: 6.9 10*3/uL (ref 4.0–10.5)

## 2013-06-20 LAB — GLUCOSE, CAPILLARY
GLUCOSE-CAPILLARY: 113 mg/dL — AB (ref 70–99)
GLUCOSE-CAPILLARY: 247 mg/dL — AB (ref 70–99)
Glucose-Capillary: 111 mg/dL — ABNORMAL HIGH (ref 70–99)
Glucose-Capillary: 136 mg/dL — ABNORMAL HIGH (ref 70–99)
Glucose-Capillary: 130 mg/dL — ABNORMAL HIGH (ref 70–99)

## 2013-06-20 LAB — BASIC METABOLIC PANEL
BUN: 45 mg/dL — ABNORMAL HIGH (ref 6–23)
CALCIUM: 7.5 mg/dL — AB (ref 8.4–10.5)
CO2: 30 mEq/L (ref 19–32)
CREATININE: 1.28 mg/dL — AB (ref 0.50–1.10)
Chloride: 84 mEq/L — ABNORMAL LOW (ref 96–112)
GFR, EST AFRICAN AMERICAN: 46 mL/min — AB (ref >90)
GFR, EST NON AFRICAN AMERICAN: 40 mL/min — AB (ref >90)
Glucose, Bld: 102 mg/dL — ABNORMAL HIGH (ref 70–99)
Potassium: 3.3 mEq/L — ABNORMAL LOW (ref 3.7–5.3)
Sodium: 125 mEq/L — ABNORMAL LOW (ref 137–147)

## 2013-06-20 LAB — OCCULT BLOOD X 1 CARD TO LAB, STOOL: Fecal Occult Bld: POSITIVE — AB

## 2013-06-20 LAB — PROTIME-INR
INR: 2.95 — ABNORMAL HIGH (ref 0.00–1.49)
Prothrombin Time: 29.7 seconds — ABNORMAL HIGH (ref 11.6–15.2)

## 2013-06-20 LAB — MRSA PCR SCREENING: MRSA BY PCR: NEGATIVE

## 2013-06-20 MED ORDER — DIPHENHYDRAMINE HCL 25 MG PO CAPS
25.0000 mg | ORAL_CAPSULE | Freq: Four times a day (QID) | ORAL | Status: DC | PRN
  Administered 2013-06-20: 25 mg via ORAL
  Filled 2013-06-20: qty 1

## 2013-06-20 MED ORDER — SODIUM CHLORIDE 0.9 % IJ SOLN
3.0000 mL | Freq: Two times a day (BID) | INTRAMUSCULAR | Status: DC
  Administered 2013-06-20: 3 mL via INTRAVENOUS

## 2013-06-20 MED ORDER — HYDROCODONE-ACETAMINOPHEN 5-325 MG PO TABS
1.0000 | ORAL_TABLET | Freq: Three times a day (TID) | ORAL | Status: DC | PRN
  Administered 2013-06-20: 1 via ORAL
  Filled 2013-06-20: qty 1

## 2013-06-20 MED ORDER — SODIUM CHLORIDE 0.9 % IV SOLN: 250.0000 mL | INTRAVENOUS | Status: DC | PRN

## 2013-06-20 MED ORDER — DOCUSATE SODIUM 100 MG PO CAPS
100.0000 mg | ORAL_CAPSULE | Freq: Every day | ORAL | Status: DC
  Administered 2013-06-20 – 2013-06-26 (×6): 100 mg via ORAL
  Filled 2013-06-20 (×7): qty 1

## 2013-06-20 MED ORDER — FUROSEMIDE 80 MG PO TABS
80.0000 mg | ORAL_TABLET | ORAL | Status: DC
  Filled 2013-06-20 (×2): qty 1

## 2013-06-20 MED ORDER — DEXTROSE 5 % IV SOLN
2.0000 g | Freq: Three times a day (TID) | INTRAVENOUS | Status: DC
  Administered 2013-06-20 – 2013-06-21 (×5): 2 g via INTRAVENOUS
  Filled 2013-06-20 (×7): qty 2

## 2013-06-20 MED ORDER — SODIUM CHLORIDE 0.9 % IV SOLN
INTRAVENOUS | Status: DC
  Administered 2013-06-20 – 2013-06-21 (×2): via INTRAVENOUS

## 2013-06-20 MED ORDER — METHYLPREDNISOLONE SODIUM SUCC 125 MG IJ SOLR
125.0000 mg | Freq: Once | INTRAMUSCULAR | Status: AC
  Administered 2013-06-20: 125 mg via INTRAVENOUS
  Filled 2013-06-20: qty 2

## 2013-06-20 MED ORDER — WARFARIN SODIUM 2 MG PO TABS
2.0000 mg | ORAL_TABLET | Freq: Every day | ORAL | Status: DC
  Filled 2013-06-20: qty 1

## 2013-06-20 MED ORDER — NALOXONE HCL 0.4 MG/ML IJ SOLN
0.2000 mg | INTRAMUSCULAR | Status: DC | PRN
  Administered 2013-06-20: 0.2 mg via INTRAVENOUS
  Filled 2013-06-20: qty 1

## 2013-06-20 MED ORDER — LISINOPRIL 5 MG PO TABS
5.0000 mg | ORAL_TABLET | Freq: Every day | ORAL | Status: DC
  Administered 2013-06-20 – 2013-06-22 (×3): 5 mg via ORAL
  Filled 2013-06-20 (×4): qty 1

## 2013-06-20 MED ORDER — WARFARIN - PHYSICIAN DOSING INPATIENT: Freq: Every day | Status: DC

## 2013-06-20 MED ORDER — CARVEDILOL 6.25 MG PO TABS
6.2500 mg | ORAL_TABLET | Freq: Two times a day (BID) | ORAL | Status: DC
  Administered 2013-06-20 – 2013-06-26 (×9): 6.25 mg via ORAL
  Filled 2013-06-20 (×16): qty 1

## 2013-06-20 MED ORDER — WARFARIN - PHARMACIST DOSING INPATIENT: Freq: Every day | Status: DC

## 2013-06-20 MED ORDER — SODIUM CHLORIDE 0.9 % IJ SOLN: 3.0000 mL | INTRAMUSCULAR | Status: DC | PRN

## 2013-06-20 MED ORDER — FUROSEMIDE 40 MG PO TABS
40.0000 mg | ORAL_TABLET | Freq: Every evening | ORAL | Status: DC
  Filled 2013-06-20: qty 1

## 2013-06-20 MED ORDER — HYDROCODONE-ACETAMINOPHEN 5-325 MG PO TABS
0.5000 | ORAL_TABLET | Freq: Three times a day (TID) | ORAL | Status: DC | PRN
  Administered 2013-06-20 – 2013-06-21 (×3): 0.5 via ORAL
  Filled 2013-06-20 (×3): qty 1

## 2013-06-20 MED ORDER — WARFARIN SODIUM 2 MG PO TABS
2.0000 mg | ORAL_TABLET | Freq: Once | ORAL | Status: AC
  Administered 2013-06-20: 2 mg via ORAL
  Filled 2013-06-20: qty 1

## 2013-06-20 MED ORDER — VANCOMYCIN HCL 10 G IV SOLR
1500.0000 mg | Freq: Every day | INTRAVENOUS | Status: DC
  Administered 2013-06-20 (×2): 1500 mg via INTRAVENOUS
  Filled 2013-06-20 (×3): qty 1500

## 2013-06-20 MED ORDER — NALOXONE HCL 0.4 MG/ML IJ SOLN
INTRAMUSCULAR | Status: AC
  Administered 2013-06-20: 0.4 mg
  Filled 2013-06-20: qty 1

## 2013-06-20 MED ORDER — POTASSIUM CHLORIDE CRYS ER 20 MEQ PO TBCR
40.0000 meq | EXTENDED_RELEASE_TABLET | Freq: Two times a day (BID) | ORAL | Status: AC
  Administered 2013-06-20 (×2): 40 meq via ORAL
  Filled 2013-06-20 (×2): qty 2

## 2013-06-20 NOTE — Progress Notes (Signed)
Called this am secondary to pt being lethargic which is a change from earlier in shift. Rapid response at bedside. VSS. Pt able to wake up and answer questions, but falls right back off to sleep. Review of pt's meds revealed she had gotten 100 of Fentanyl at 7pm and Benadryl 25 with Vicodin i at 215am. ABG done which looks OK. Narcan 0.2mg  given and pt has awaken more. Likely medication related. This NP d/c'd Benadryl, Fentanyl was a one time dose, and I reduced the Vicodin to 1/2 tab q8 prn.  Clance Boll, NP [facility]

## 2013-06-20 NOTE — Significant Event (Signed)
Rapid Response Event Note  Overview: Time Called: 0603 Arrival Time: 0605 Event Type: Respiratory;Neurologic  Initial Focused Assessment: Called by bedside RN regarding change in LOC and shallow respirations. VSS and CBG WNL (see doc flowsheet). Skin warm and dry. Patient arouses to noxious stimuli and quickly falls back asleep. Patient follows commands and moves all extremities equally with constant stimuli. Shallow respirations originating from abdomen. Lung sounds diminished. Patient received Vicodin and Benadryl around 0200.   Interventions: ABG drawn by RT per protocol. NP notified, orders received for 0.2mg  Narcan. After administration of Narcan, patient opens eyes on her own, still lethargic but able to conversate and follow commands without constant stimuli. ABG WNL Medication list changed, will continue to monitor, advised bedside RN to call with further needs.   Event Summary: Name of Physician Notified: Forrest Moron, NP at [phone]    at    Outcome: Stayed in room and stabalized  Event End Time: [place]

## 2013-06-20 NOTE — Progress Notes (Addendum)
[facility] PROGRESS NOTE   Assessment/Plan: Sepsis due HCAP: - Started empirically on Vanc and aztreonam. Blood cultures pending. - Afebrile. No leukocytosis.  Acute encephalopathy: - Probably due to narcotics. - Was given narcan overnight with improvement in her mental status. She received fentanyl and Vicodin for pain. - D/c narcotics, NPO, upright position. Ct head she is on coumadin. - Re-evaluate in afternoon. Place pulse oximetry. - BC  Pending, CXR showed possible PNA  Rash and nonspecific skin eruption: - Unclear etiology. Started on steroids, per ID note she did have biopsy on 11.2015. Was empirically on steroids at that time. Rash was improving. - Biopsy shows acute inflammation, no viral infection. - Seen by rheumatologist and dermatologist  in 2011 recommended steroids. As per MD note from 11.2011 note there was a suspicion for low-level Stevens-Johnson versus bullous pemphigus. - cont steroids IV.  Hyponatremia/AKI: - she was on lasix and metolazone. Appears to be dehydrated by physcial exam. - I will start her on IV fluids gently. - b-met in am.  Thrombocytosis: - unclear etiology ? Reactive. - monitor.  Chronic respiratory failure with hypoxia: - at baseline cont. O2L.  Acute anemia in setting of Anemia of chronic disease  - Baseline Hgb appears to be 11 (Feb 2015)   Chronic Afib  - Rate controlled.  - INR therapeutic cont coumadin   Hx of Chronic systolic and diastolic CHF  - TTE 10/4763: EF 50-55%.  - cont home meds  H/o  Pulmonary hypertension: - echo on 2015 showed EF 60 %.    Code Status: FULL  Family Communication: spoke w/ pt and son at bedside  Disposition Plan: inpatinet  Consultants:  none  Procedures:  CT head  CXR  Antibiotics:  vanc aztreonam 5.2.2015  HPI/Subjective: Pt sedated. Responsive to stimuli.  Objective: Filed Vitals:   06/20/13 0606 06/20/13 0619 06/20/13 0745 06/20/13 0819  BP:   110/56 114/58   Pulse:   74 69  Temp:  98.1 F (36.7 C)  97.9 F (36.6 C)  TempSrc:  Oral  Oral  Resp: 14   18  Height:      Weight:      SpO2:   100% 100%    Intake/Output Summary (Last 24 hours) at 06/20/13 0900 Last data filed at 06/19/13 2122  Gross per 24 hour  Intake      0 ml  Output    218 ml  Net   -218 ml   Filed Weights   06/20/13 0103  Weight: 100.699 kg (222 lb)    Exam:  General: Alert, awake, oriented x1, in no acute distress.  HEENT: No bruits, no goiter. Skin: macular papular rash. On chest and extremities. Heart: Regular rate and rhythm, without murmurs, rubs, gallops.  Lungs: Good air movement, bilateral air movement.  Abdomen: Soft, nontender, nondistended, positive bowel sounds.    Data Reviewed: Basic Metabolic Panel:  Recent Labs Lab 06/19/13 2050 06/20/13 0428  NA 124* 125*  K 3.9 3.3*  CL 83* 84*  CO2 30 30  GLUCOSE 91 102*  BUN 52* 45*  CREATININE 1.43* 1.28*  CALCIUM 7.4* 7.5*   Liver Function Tests:  Recent Labs Lab 06/19/13 2050  AST 18  ALT 7  ALKPHOS 112  BILITOT 0.3  PROT 6.6  ALBUMIN 2.7*   No results found for this basename: LIPASE, AMYLASE,  in the last 168 hours No results found for this basename: AMMONIA,  in the last 168 hours CBC:  Recent Labs Lab  06/19/13 2050 06/20/13 0428  WBC 8.0 6.9  NEUTROABS 6.8 6.1  HGB 9.0* 9.0*  HCT 27.5* 27.9*  MCV 92.3 93.0  PLT 433* 430*   Cardiac Enzymes:  Recent Labs Lab 06/19/13 2050  CKTOTAL 61  TROPONINI <0.30   BNP (last 3 results)  Recent Labs  06/27/12 1129 07/13/12 1121 06/03/13 1825  PROBNP 4914.0* 106.0* 27115.0*   CBG:  Recent Labs Lab 06/20/13 0600 06/20/13 0754  GLUCAP 113* 111*    Recent Results (from the past 240 hour(s))  MRSA PCR SCREENING     Status: None   Collection Time    06/20/13  1:07 AM      Result Value Ref Range Status   MRSA by PCR NEGATIVE  NEGATIVE Final   Comment:            The GeneXpert MRSA Assay (FDA     approved for  NASAL specimens     only), is one component of a     comprehensive MRSA colonization     surveillance program. It is not     intended to diagnose MRSA     infection nor to guide or     monitor treatment for     MRSA infections.     Studies: Dg Chest Portable 1 View  06/19/2013   CLINICAL DATA:  Shortness of breath  EXAM: PORTABLE CHEST - 1 VIEW  COMPARISON:  06/03/2013  FINDINGS: Patchy lingular opacity, suspicious for pneumonia.  Possible mild patchy right perihilar opacity, equivocal. Eventration of the left hemidiaphragm. No pleural effusion or pneumothorax.  Cardiomegaly.  IMPRESSION: Suspected lingular pneumonia.   Electronically Signed   By: Julian Hy M.D.   On: 06/19/2013 19:21    Scheduled Meds: . aztreonam  2 g Intravenous 3 times per day  . carvedilol  6.25 mg Oral BID WC  . docusate sodium  100 mg Oral Daily  . furosemide  40 mg Oral QPM  . furosemide  80 mg Oral Q24H  . lisinopril  5 mg Oral Daily  . sodium chloride  3 mL Intravenous Q12H  . vancomycin  1,500 mg Intravenous QHS  . warfarin  2 mg Oral q1800  . Warfarin - Physician Dosing Inpatient   Does not apply q1800   Continuous Infusions:    Charlynne Cousins  [facility] Pager [phone]. If 8PM-8AM, please contact night-coverage at [email or web address], password [facility] 06/20/2013, 9:00 AM  LOS: 1 day

## 2013-06-20 NOTE — Progress Notes (Signed)
Pt lethargic this AM and hard to arouse. Pt received norco and benadryll over night. Pt VSS, breathing shallow respirations, cbg 113. Rapid response called to assess pt. K Kirby notified. New order for narcan and blood gas.  Narcan given to pt. Will continue to monitor. Ronnette Hila, RN

## 2013-06-20 NOTE — Progress Notes (Signed)
Rapid response called this morning as patient was difficult to arouse, including to pain.  Vital signs stable.  Narcan administered to patient and patient became more easily arousable but still drowsy.  See RR note.  MD at bedside to assess.  Medications held and patient maintained NPO until drowsiness subsided.  Family at bedside this afternoon and evening; patient awake and oriented x3 (disoriented to time).  Patient had moderate clots in perineal area, appears to have been from rectum; per MD, will continue to watch hemoglobin.  Patient requesting PRN for pain 10/10, vicodin administered to patient per order (see MAR).  Patient requesting dinner; MD notified, heart healthy diet order placed.  Patient sitting up in bed eating dinner.  Will continue to monitor.

## 2013-06-20 NOTE — Progress Notes (Signed)
Pt fecal occult blood positive. Per day shift RN, Dr. Olevia Bowens aware of clots found in stool. Pt stool currently dark and soft, no clots noted

## 2013-06-20 NOTE — Progress Notes (Signed)
Called by nursing staff, patient again lethargic and difficult to arouse.  BP 110/56  HR 74  RR 18  O2 sat 100% on 2 L [place].  0619 ABG result: 7.42/46/114/29. Patient had received  100 mcg Fentanyl, 1 Vicodin, 25 mg po Benadryl over the past 12 hours. Narcan being given as I arrive to the room.  Patient aroused significantly.  Patient was verbal but difficult to understand and follow some commands weakly.  CBG 111. After 15 minutes patient again difficult to arouse, occ following commands.  Dr Olevia Bowens notified of patient status, and at bedside to assess patient.  Continuous pulse ox placed on patient. Will continue to monitor patient.

## 2013-06-20 NOTE — Progress Notes (Signed)
ANTICOAGULATION CONSULT NOTE - Initial Consult  Pharmacy Consult for Warfarin Indication: Hx Afib  Allergies  Allergen Reactions  . Penicillins Other (See Comments)    unknown  . Tetanus-Diphtheria Toxoids Td Other (See Comments)    unknown  . Latex Hives and Rash  . Levaquin [Levofloxacin In D5w] Rash    Patient Measurements: Height: 5\' 1"  (154.9 cm) Weight: 222 lb (100.699 kg) (bed scale; pt too weak to stand) IBW/kg (Calculated) : 47.8  Vital Signs: Temp: 97.9 F (36.6 C) (05/13 0819) Temp src: Oral (05/13 0819) BP: 112/71 mmHg (05/13 1149) Pulse Rate: 63 (05/13 1149)  Labs:  Recent Labs  06/19/13 2050 06/20/13 0428  HGB 9.0* 9.0*  HCT 27.5* 27.9*  PLT 433* 430*  LABPROT 30.4* 29.7*  INR 3.04* 2.95*  CREATININE 1.43* 1.28*  CKTOTAL 61  --   TROPONINI <0.30  --     Estimated Creatinine Clearance: 40.7 ml/min (by C-G formula based on Cr of 1.28).   Medical History: Past Medical History  Diagnosis Date  . HTN (hypertension)   . Diverticulosis   . Hemorrhoid   . Osteoarthritis   . Constipation, chronic   . H/O: hysterectomy   . Systolic and diastolic CHF, chronic     a. Echo 07/2011: Technically limited; inferior HK, inferolateral and possibly lateral wall HK, EF 40%, moderate LVE, aortic sclerosis without stenosis, mild MR, mild LAE, question RV dysfunction;   b.  Echo (04/2013): EF 50-55%, normal wall motion, moderate MR, mild to moderate LAE, PASP 37  . Female bladder prolapse     with prolapse uterus, cystocele, s/p TVH/BSO 12/2006  . Obesity (BMI 30-39.9)     wt. 94.1 kg 12/2010  . Moderate mitral regurgitation     a. Mod to Sev by echo 01/2009;  b. mild MR by echo 2013  . Atrial fibrillation 10/13/2011    coumadin Rx  . Iron deficiency anemia     Assessment: 22 YOF on warfarin PTA for hx Afib - to resume this admission. The patient is noted to have a recently elevated INR and was told to hold doses on 5/11 and 5/12 and resume a lower dose of 2 mg  daily on 5/13 (previously was on 2.5 mg daily). INR therapeutic today (2.95 << 3.04). Hgb/Hct/Plt stable - no overt bleeding noted.   Goal of Therapy:  INR 2-3   Plan:  1. Warfarin 2 mg x 1 dose at 1800 today 2. Daily PT/INR 3. Will continue to monitor for any signs/symptoms of bleeding and will follow up with PT/INR in the a.m.   Alycia Rossetti, PharmD, BCPS Clinical Pharmacist Pager: [phone] 06/20/2013 12:39 PM

## 2013-06-20 NOTE — ED Provider Notes (Signed)
Medical screening examination/treatment/procedure(s) were performed by non-physician practitioner and as supervising physician I was immediately available for consultation/collaboration.   EKG Interpretation   Date/Time:  Tuesday Jun 19 2013 19:22:06 EDT Ventricular Rate:  66 PR Interval:  213 QRS Duration: 173 QT Interval:  461 QTC Calculation: 483 R Axis:   -71 Text Interpretation:  Sinus rhythm Borderline prolonged PR interval  Nonspecific IVCD with LAD LVH with secondary repolarization abnormality  Anterior Q waves, possibly due to LVH No significant change since last  tracing Confirmed by Winfred Leeds  MD, SAM [phone]) on 06/19/2013 7:24:45 PM        Juanda Crumble B. Karle Starch, MD 06/20/13 1252

## 2013-06-20 NOTE — Progress Notes (Signed)
PT Cancellation Note  Patient Details Name: [name] MRN: [id number] DOB: 23-Jan-1935   Cancelled Treatment:    Reason Eval/Treat Not Completed: Patient not medically ready;Patient at procedure or test/unavailable.  Pt with 2 rapid responses this AM and transport here to take pt to test.  Will check back tomorrow.   Galen Manila 06/20/2013, 10:56 AM

## 2013-06-20 NOTE — Progress Notes (Signed)
Pt arrived to floor in NAD, VSS. Pt with rash noted to arms and chest. Pt oriented to room and floor. Will continue to monitor. Ronnette Hila, RN

## 2013-06-20 NOTE — Progress Notes (Signed)
MD on call K Baltazar Najjar paged via [place]- notified that abx given prior to blood cultures being drawn.

## 2013-06-21 DIAGNOSIS — I509 Heart failure, unspecified: Secondary | ICD-10-CM

## 2013-06-21 DIAGNOSIS — I5043 Acute on chronic combined systolic (congestive) and diastolic (congestive) heart failure: Secondary | ICD-10-CM

## 2013-06-21 DIAGNOSIS — D473 Essential (hemorrhagic) thrombocythemia: Secondary | ICD-10-CM

## 2013-06-21 LAB — CBC
HEMATOCRIT: 26.7 % — AB (ref 36.0–46.0)
Hemoglobin: 8.5 g/dL — ABNORMAL LOW (ref 12.0–15.0)
MCH: 29.9 pg (ref 26.0–34.0)
MCHC: 31.8 g/dL (ref 30.0–36.0)
MCV: 94 fL (ref 78.0–100.0)
Platelets: 391 10*3/uL (ref 150–400)
RBC: 2.84 MIL/uL — ABNORMAL LOW (ref 3.87–5.11)
RDW: 13.8 % (ref 11.5–15.5)
WBC: 14.6 10*3/uL — AB (ref 4.0–10.5)

## 2013-06-21 LAB — BASIC METABOLIC PANEL
BUN: 48 mg/dL — ABNORMAL HIGH (ref 6–23)
CO2: 25 mEq/L (ref 19–32)
Calcium: 8.1 mg/dL — ABNORMAL LOW (ref 8.4–10.5)
Chloride: 88 mEq/L — ABNORMAL LOW (ref 96–112)
Creatinine, Ser: 1.29 mg/dL — ABNORMAL HIGH (ref 0.50–1.10)
GFR, EST AFRICAN AMERICAN: 45 mL/min — AB (ref >90)
GFR, EST NON AFRICAN AMERICAN: 39 mL/min — AB (ref >90)
Glucose, Bld: 176 mg/dL — ABNORMAL HIGH (ref 70–99)
Potassium: 5.1 mEq/L (ref 3.7–5.3)
Sodium: 126 mEq/L — ABNORMAL LOW (ref 137–147)

## 2013-06-21 LAB — GLUCOSE, CAPILLARY
GLUCOSE-CAPILLARY: 125 mg/dL — AB (ref 70–99)
GLUCOSE-CAPILLARY: 124 mg/dL — AB (ref 70–99)
GLUCOSE-CAPILLARY: 94 mg/dL (ref 70–99)
Glucose-Capillary: 143 mg/dL — ABNORMAL HIGH (ref 70–99)
Glucose-Capillary: 171 mg/dL — ABNORMAL HIGH (ref 70–99)

## 2013-06-21 LAB — PROTIME-INR
INR: 2.86 — AB (ref 0.00–1.49)
Prothrombin Time: 29 seconds — ABNORMAL HIGH (ref 11.6–15.2)

## 2013-06-21 MED ORDER — LEVOFLOXACIN 500 MG PO TABS: 500.0000 mg | ORAL_TABLET | Freq: Every day | ORAL | Status: DC

## 2013-06-21 MED ORDER — HYDROCODONE-ACETAMINOPHEN 5-325 MG PO TABS
1.0000 | ORAL_TABLET | Freq: Three times a day (TID) | ORAL | Status: DC | PRN
Start: 2013-06-21 — End: 2013-06-23
  Administered 2013-06-21 – 2013-06-23 (×4): 1 via ORAL
  Filled 2013-06-21 (×5): qty 1

## 2013-06-21 MED ORDER — ONDANSETRON HCL 4 MG/2ML IJ SOLN
4.0000 mg | Freq: Four times a day (QID) | INTRAMUSCULAR | Status: DC | PRN
  Administered 2013-06-21 – 2013-06-26 (×9): 4 mg via INTRAVENOUS
  Filled 2013-06-21 (×12): qty 2

## 2013-06-21 MED ORDER — LEVOFLOXACIN 500 MG PO TABS
500.0000 mg | ORAL_TABLET | Freq: Every day | ORAL | Status: DC
  Administered 2013-06-21: 500 mg via ORAL
  Filled 2013-06-21 (×2): qty 1

## 2013-06-21 MED ORDER — SODIUM CHLORIDE 0.9 % IV SOLN
INTRAVENOUS | Status: DC
  Administered 2013-06-22: 08:00:00 via INTRAVENOUS

## 2013-06-21 MED ORDER — WARFARIN SODIUM 2 MG PO TABS
2.0000 mg | ORAL_TABLET | Freq: Once | ORAL | Status: AC
  Administered 2013-06-21: 2 mg via ORAL
  Filled 2013-06-21: qty 1

## 2013-06-21 NOTE — Progress Notes (Addendum)
[facility] PROGRESS NOTE   Assessment/Plan: Sepsis due HCAP: - Started empirically on Vanc and aztreonam. Blood cultures negative till date. - de-escalate to Levaquin. - Afebrile. No leukocytosis. - Aspiration, check SLP.  Acute encephalopathy: - now resovled. - Probably due to narcotics. - Was given narcan overnight with improvement in her mental status. She received fentanyl and Vicodin for pain. - D'cd narcotics, NPO, upright position. Ct head negative for bleed she is on coumadin. - Awake start low dose narcotics for pain.  Rash and nonspecific skin eruption: - Unclear etiology. Started on steroids, per ID note she did have biopsy on 11.2015. Was empirically on steroids at that time. Rash was improving. - This rash is not related to levaquin. - Biopsy shows acute inflammation, no viral infection. - Seen by rheumatologist and dermatologist  in 2011 recommended steroids. As per MD note from 11.2011 note there was a suspicion for low-level Stevens-Johnson versus bullous pemphigus. - cont steroids IV.  Hyponatremia/AKI: - she was on lasix and metolazone. Appears to be dehydrated by physcial exam. - I will start her on IV fluids gently. - b-met in am.  Thrombocytosis: - unclear etiology ? Reactive. - monitor.  Chronic respiratory failure with hypoxia: - at baseline cont. O2L.  Acute anemia in setting of Anemia of chronic disease  - Baseline Hgb appears to be 11 (Feb 2015)   Chronic Afib  - Rate controlled.  - INR therapeutic cont coumadin   Hx of Chronic systolic and diastolic CHF  - TTE 07/107: EF 50-55%.  - cont home meds  H/o  Pulmonary hypertension: - echo on 2015 showed EF 60 %.    Code Status: FULL  Family Communication: spoke w/ pt and son at bedside  Disposition Plan: inpatinet  Consultants:  none  Procedures:  CT head  CXR  Antibiotics:  vanc aztreonam 5.2.2015  HPI/Subjective: Complaining of pain all  over.  Objective: Filed Vitals:   06/21/13 0540 06/21/13 0542 06/21/13 0804 06/21/13 1011  BP:   111/66 114/91  Pulse:   65 66  Temp:    97.8 F (36.6 C)  TempSrc:    Oral  Resp:    16  Height:      Weight: 104.418 kg (230 lb 3.2 oz) 104.146 kg (229 lb 9.6 oz)    SpO2:    99%    Intake/Output Summary (Last 24 hours) at 06/21/13 1248 Last data filed at 06/21/13 1047  Gross per 24 hour  Intake 2936.25 ml  Output      0 ml  Net 2936.25 ml   Filed Weights   06/20/13 0103 06/21/13 0540 06/21/13 0542  Weight: 100.699 kg (222 lb) 104.418 kg (230 lb 3.2 oz) 104.146 kg (229 lb 9.6 oz)    Exam:  General: Alert, awake, oriented x3, in no acute distress.  HEENT: No bruits, no goiter. Skin: macular papular rash. On chest and extremities. Heart: Regular rate and rhythm, without murmurs, rubs, gallops.  Lungs: Good air movement, bilateral air movement.  Abdomen: Soft, nontender, nondistended, positive bowel sounds.    Data Reviewed: Basic Metabolic Panel:  Recent Labs Lab 06/19/13 2050 06/20/13 0428 06/21/13 0953  NA 124* 125* 126*  K 3.9 3.3* 5.1  CL 83* 84* 88*  CO2 $Re'30 30 25  'pqK$ GLUCOSE 91 102* 176*  BUN 52* 45* 48*  CREATININE 1.43* 1.28* 1.29*  CALCIUM 7.4* 7.5* 8.1*   Liver Function Tests:  Recent Labs Lab 06/19/13 2050  AST 18  ALT 7  ALKPHOS 112  BILITOT 0.3  PROT 6.6  ALBUMIN 2.7*   No results found for this basename: LIPASE, AMYLASE,  in the last 168 hours No results found for this basename: AMMONIA,  in the last 168 hours CBC:  Recent Labs Lab 06/19/13 2050 06/20/13 0428 06/21/13 0500  WBC 8.0 6.9 14.6*  NEUTROABS 6.8 6.1  --   HGB 9.0* 9.0* 8.5*  HCT 27.5* 27.9* 26.7*  MCV 92.3 93.0 94.0  PLT 433* 430* 391   Cardiac Enzymes:  Recent Labs Lab 06/19/13 2050  CKTOTAL 61  TROPONINI <0.30   BNP (last 3 results)  Recent Labs  06/27/12 1129 07/13/12 1121 06/03/13 1825  PROBNP 4914.0* 106.0* 27115.0*   CBG:  Recent Labs Lab  06/20/13 1539 06/20/13 2047 06/21/13 0009 06/21/13 0506 06/21/13 1143  GLUCAP 130* 247* 171* 143* 125*    Recent Results (from the past 240 hour(s))  MRSA PCR SCREENING     Status: None   Collection Time    06/20/13  1:07 AM      Result Value Ref Range Status   MRSA by PCR NEGATIVE  NEGATIVE Final   Comment:            The GeneXpert MRSA Assay (FDA     approved for NASAL specimens     only), is one component of a     comprehensive MRSA colonization     surveillance program. It is not     intended to diagnose MRSA     infection nor to guide or     monitor treatment for     MRSA infections.  CULTURE, BLOOD (ROUTINE X 2)     Status: None   Collection Time    06/20/13  4:20 AM      Result Value Ref Range Status   Specimen Description BLOOD LEFT ARM   Final   Special Requests BOTTLES DRAWN AEROBIC ONLY 5CC   Final   Culture  Setup Time     Final   Value: 06/20/2013 12:01     Performed at [facility]   Culture     Final   Value:        BLOOD CULTURE RECEIVED NO GROWTH TO DATE CULTURE WILL BE HELD FOR 5 DAYS BEFORE ISSUING A FINAL NEGATIVE REPORT     Performed at [facility]   Report Status PENDING   Incomplete  CULTURE, BLOOD (ROUTINE X 2)     Status: None   Collection Time    06/20/13  4:28 AM      Result Value Ref Range Status   Specimen Description BLOOD LEFT HAND   Final   Special Requests BOTTLES DRAWN AEROBIC AND ANAEROBIC 10CC EACH   Final   Culture  Setup Time     Final   Value: 06/20/2013 12:01     Performed at [facility]   Culture     Final   Value:        BLOOD CULTURE RECEIVED NO GROWTH TO DATE CULTURE WILL BE HELD FOR 5 DAYS BEFORE ISSUING A FINAL NEGATIVE REPORT     Performed at [facility]   Report Status PENDING   Incomplete     Studies: Ct Head Wo Contrast  06/20/2013   CLINICAL DATA:  Confusion.  Altered mental status.  EXAM: CT HEAD WITHOUT CONTRAST  TECHNIQUE: Contiguous axial images were obtained from the base  of the skull through the vertex without intravenous contrast.  COMPARISON:  CT HEAD W/O CM dated 06/03/2013  FINDINGS: No mass lesion, mass effect, midline shift, hydrocephalus, hemorrhage. No acute territorial cortical ischemia/infarct. Atrophy and chronic ischemic white matter disease is present. Mucous retention cyst/polyp is present in the right maxillary sinus.  IMPRESSION: Atrophy and chronic ischemic white matter disease without acute intracranial abnormality.   Electronically Signed   By: Dereck Ligas M.D.   On: 06/20/2013 13:27   Dg Chest Portable 1 View  06/19/2013   CLINICAL DATA:  Shortness of breath  EXAM: PORTABLE CHEST - 1 VIEW  COMPARISON:  06/03/2013  FINDINGS: Patchy lingular opacity, suspicious for pneumonia.  Possible mild patchy right perihilar opacity, equivocal. Eventration of the left hemidiaphragm. No pleural effusion or pneumothorax.  Cardiomegaly.  IMPRESSION: Suspected lingular pneumonia.   Electronically Signed   By: Julian Hy M.D.   On: 06/19/2013 19:21    Scheduled Meds: . aztreonam  2 g Intravenous 3 times per day  . carvedilol  6.25 mg Oral BID WC  . docusate sodium  100 mg Oral Daily  . lisinopril  5 mg Oral Daily  . vancomycin  1,500 mg Intravenous QHS  . warfarin  2 mg Oral ONCE-1800  . Warfarin - Pharmacist Dosing Inpatient   Does not apply q1800   Continuous Infusions: . sodium chloride 75 mL/hr at 06/21/13 [facility] Pager [phone]. If 8PM-8AM, please contact night-coverage at [email or web address], password [facility] 06/21/2013, 12:48 PM  LOS: 2 days

## 2013-06-21 NOTE — Evaluation (Signed)
Physical Therapy Evaluation Patient Details Name: [name] MRN: [id number] DOB: 1934-03-02 Today's Date: 06/21/2013   History of Present Illness  79 year old female adm 06/19/13 with SOB. Noted to have rash over torso and arms (blister-like) ?vasculitis per history. Period of decr arousal with pt responding to narcan. PMHx-recent hospitalizations with d/c to SNF for therapies (usually lives alone) obesity, hypertension, CHF, and A. Fib  Clinical Impression  Pt admitted with SOB and abd pain. She recently left hospital and entered SNF for therapy due to inability to return home alone. Pt currently with functional limitations due to the deficits listed below (see PT Problem List).  Pt will benefit from skilled PT to increase their independence and safety with mobility to allow discharge to the venue listed below.       Follow Up Recommendations SNF;Supervision/Assistance - 24 hour    Equipment Recommendations  None recommended by PT    Recommendations for Other Services       Precautions / Restrictions Precautions Precautions: Fall      Mobility  Bed Mobility Overal bed mobility: Needs Assistance Bed Mobility: Rolling Rolling: Mod assist         General bed mobility comments: pt dyspneic throughout bed level assessment (SaO2 100% on [place] O2); use of rail and vc for technique; pt unable to roll lower body from supine to side  Transfers                    Ambulation/Gait                Stairs            Wheelchair Mobility    Modified Rankin (Stroke Patients Only)       Balance                                             Pertinent Vitals/Pain SaO2 100% throughout session (on [place] O2) Dyspneic 3/4     Home Living Family/patient expects to be discharged to:: Skilled nursing facility                      Prior Function Level of Independence: Needs assistance   Gait / Transfers Assistance Needed: reports she has  not walked since leaving the hospital; at times +2 assist for transfer vs use of lift           Hand Dominance   Dominant Hand: Right    Extremity/Trunk Assessment   Upper Extremity Assessment: RUE deficits/detail;LUE deficits/detail RUE Deficits / Details: AROM shoulder to 80, AAROM to 120; triceps 3, biceps 3+     LUE Deficits / Details: AROM shoulder to 40, AAROM to 90; triceps 3, biceps 3+   Lower Extremity Assessment: RLE deficits/detail;LLE deficits/detail RLE Deficits / Details: AROM ankle to neutral DF, AAROM knee/hip to 40 (limited by knee pain and resisting further flexion); hip flexion 2+, extension 3 LLE Deficits / Details: AROM ankle to neutral DF, AAROM knee/hip to 50; hip flexion 2+, extension 3     Communication   Communication: Other (comment) (SOB)  Cognition Arousal/Alertness: Awake/alert Behavior During Therapy: WFL for tasks assessed/performed Overall Cognitive Status: Within Functional Limits for tasks assessed                      General Comments General comments (skin integrity, edema,  etc.): Pt reported abd pain and nausea on first attempt to evaluate her today. She still was not feeling very well, yet wanted to get started with whatever she could do.     Exercises Other Exercises Other Exercises: 2-3 reps of each AROM/AAROM with rest breaks due to dyspnea      Assessment/Plan    PT Assessment Patient needs continued PT services  PT Diagnosis Generalized weakness   PT Problem List Decreased strength;Decreased range of motion;Decreased activity tolerance;Decreased mobility;Decreased knowledge of use of DME;Cardiopulmonary status limiting activity;Impaired sensation;Obesity;Pain  PT Treatment Interventions DME instruction;Functional mobility training;Therapeutic activities;Therapeutic exercise;Balance training;Cognitive remediation;Patient/family education   PT Goals (Current goals can be found in the Care Plan section) Acute Rehab PT  Goals Patient Stated Goal: agrees she wants to feel better and stronger PT Goal Formulation: With patient Time For Goal Achievement: 07/05/13 Potential to Achieve Goals: Fair    Frequency Min 2X/week   Barriers to discharge        Co-evaluation               End of Session Equipment Utilized During Treatment: Oxygen Activity Tolerance: Patient limited by fatigue Patient left: in bed;with call bell/phone within reach;with bed alarm set (Lt sidelying) Nurse Communication: Patient requests pain meds (notified during a.m. attempt of eval )         Time: 6384-5364 PT Time Calculation (min): 22 min   Charges:   PT Evaluation $Initial PT Evaluation Tier I: 1 Procedure PT Treatments $Therapeutic Exercise: 8-22 mins   PT G CodesJeanie [name] [name] 06/22/13, 4:26 PM Pager [phone]

## 2013-06-21 NOTE — Progress Notes (Signed)
[place] for Warfarin Indication: Hx Afib  Allergies  Allergen Reactions  . Penicillins Other (See Comments)    unknown  . Tetanus-Diphtheria Toxoids Td Other (See Comments)    unknown  . Latex Hives and Rash  . Levaquin [Levofloxacin In D5w] Rash    Patient Measurements: Height: 5\' 1"  (154.9 cm) Weight: 229 lb 9.6 oz (104.146 kg) (re-weigh) IBW/kg (Calculated) : 47.8  Vital Signs: Temp: 97.4 F (36.3 C) (05/14 0507) Temp src: Oral (05/14 0507) BP: 111/66 mmHg (05/14 0804) Pulse Rate: 65 (05/14 0804)  Labs:  Recent Labs  06/19/13 2050 06/20/13 0428 06/21/13 0500  HGB 9.0* 9.0* 8.5*  HCT 27.5* 27.9* 26.7*  PLT 433* 430* 391  LABPROT 30.4* 29.7* 29.0*  INR 3.04* 2.95* 2.86*  CREATININE 1.43* 1.28*  --   CKTOTAL 61  --   --   TROPONINI <0.30  --   --     Estimated Creatinine Clearance: 41.5 ml/min (by C-G formula based on Cr of 1.28).   Assessment: 64 YOF on warfarin PTA for hx Afib. The patient is noted to have a recently elevated INR and was told to hold doses on 5/11 and 5/12 and resume a lower dose of 2 mg daily on 5/13 (previously was on 2.5 mg daily). INR therapeutic today  At 2.86.  Hgb 9>8.5. Had blood clot in stool yesterdaywhich resulted in FOB+, now stools w/o clots.  MD aware.   Goal of Therapy:  INR 2-3   Plan:  1. repeat Warfarin 2 mg x 1 dose at 1800 today 2. Daily PT/INR 3. Will continue to monitor for any signs/symptoms of bleeding and will follow up with PT/INR in the a.m.  Eudelia Bunch, Pharm.D. [phone] 06/21/2013 9:34 AM

## 2013-06-22 DIAGNOSIS — Z5181 Encounter for therapeutic drug level monitoring: Secondary | ICD-10-CM

## 2013-06-22 DIAGNOSIS — E876 Hypokalemia: Secondary | ICD-10-CM

## 2013-06-22 LAB — BASIC METABOLIC PANEL
BUN: 45 mg/dL — ABNORMAL HIGH (ref 6–23)
CALCIUM: 7.6 mg/dL — AB (ref 8.4–10.5)
CHLORIDE: 92 meq/L — AB (ref 96–112)
CO2: 27 mEq/L (ref 19–32)
Creatinine, Ser: 1.34 mg/dL — ABNORMAL HIGH (ref 0.50–1.10)
GFR calc non Af Amer: 37 mL/min — ABNORMAL LOW (ref >90)
GFR, EST AFRICAN AMERICAN: 43 mL/min — AB (ref >90)
Glucose, Bld: 82 mg/dL (ref 70–99)
Potassium: 4.8 mEq/L (ref 3.7–5.3)
SODIUM: 127 meq/L — AB (ref 137–147)

## 2013-06-22 LAB — GLUCOSE, CAPILLARY
GLUCOSE-CAPILLARY: 83 mg/dL (ref 70–99)
GLUCOSE-CAPILLARY: 89 mg/dL (ref 70–99)
GLUCOSE-CAPILLARY: 147 mg/dL — AB (ref 70–99)
Glucose-Capillary: 84 mg/dL (ref 70–99)

## 2013-06-22 LAB — CBC
HCT: 24.6 % — ABNORMAL LOW (ref 36.0–46.0)
Hemoglobin: 7.7 g/dL — ABNORMAL LOW (ref 12.0–15.0)
MCH: 29.8 pg (ref 26.0–34.0)
MCHC: 31.3 g/dL (ref 30.0–36.0)
MCV: 95.3 fL (ref 78.0–100.0)
PLATELETS: 395 10*3/uL (ref 150–400)
RBC: 2.58 MIL/uL — AB (ref 3.87–5.11)
RDW: 14 % (ref 11.5–15.5)
WBC: 11.3 10*3/uL — AB (ref 4.0–10.5)

## 2013-06-22 LAB — PRO B NATRIURETIC PEPTIDE: Pro B Natriuretic peptide (BNP): 6129 pg/mL — ABNORMAL HIGH (ref 0–450)

## 2013-06-22 LAB — PROTIME-INR
INR: 3.8 — ABNORMAL HIGH (ref 0.00–1.49)
PROTHROMBIN TIME: 36 s — AB (ref 11.6–15.2)

## 2013-06-22 MED ORDER — LEVOFLOXACIN 750 MG PO TABS
750.0000 mg | ORAL_TABLET | ORAL | Status: DC
  Administered 2013-06-22 – 2013-06-26 (×3): 750 mg via ORAL
  Filled 2013-06-22 (×3): qty 1

## 2013-06-22 MED ORDER — LEVOFLOXACIN 750 MG PO TABS: 750.0000 mg | ORAL_TABLET | ORAL | Status: DC

## 2013-06-22 MED ORDER — PREDNISONE 50 MG PO TABS
50.0000 mg | ORAL_TABLET | Freq: Every day | ORAL | Status: DC
  Administered 2013-06-22 – 2013-06-23 (×2): 50 mg via ORAL
  Filled 2013-06-22 (×3): qty 1

## 2013-06-22 MED ORDER — SODIUM CHLORIDE 0.9 % IV SOLN
INTRAVENOUS | Status: DC
  Administered 2013-06-22: 11:00:00 via INTRAVENOUS

## 2013-06-22 NOTE — Telephone Encounter (Signed)
Left another message to call back to discuss care.

## 2013-06-22 NOTE — Telephone Encounter (Signed)
Pt was taken to the hospital on Tuesday and is still there.

## 2013-06-22 NOTE — Progress Notes (Signed)
ANTICOAGULATION CONSULT NOTE - Follow Up Consult  Pharmacy Consult for Warfarin Indication: Hx Afib  Allergies  Allergen Reactions  . Penicillins Other (See Comments)    unknown  . Tetanus-Diphtheria Toxoids Td Other (See Comments)    unknown  . Latex Hives and Rash  . Levaquin [Levofloxacin In D5w] Rash    Patient Measurements: Height: 5\' 1"  (154.9 cm) Weight:  (unable to obtain due to bed scale being inaccurate; rn aware) IBW/kg (Calculated) : 47.8  Vital Signs: Temp: 98 F (36.7 C) (05/15 0457) Temp src: Oral (05/15 0457) BP: 104/57 mmHg (05/15 0457) Pulse Rate: 68 (05/15 0457)  Labs:  Recent Labs  06/19/13 2050 06/20/13 0428 06/21/13 0500 06/21/13 0953 06/22/13 0414  HGB 9.0* 9.0* 8.5*  --  7.7*  HCT 27.5* 27.9* 26.7*  --  24.6*  PLT 433* 430* 391  --  395  LABPROT 30.4* 29.7* 29.0*  --  36.0*  INR 3.04* 2.95* 2.86*  --  3.80*  CREATININE 1.43* 1.28*  --  1.29* 1.34*  CKTOTAL 61  --   --   --   --   TROPONINI <0.30  --   --   --   --     Estimated Creatinine Clearance: 39.6 ml/min (by C-G formula based on Cr of 1.34).   Assessment: 11 YOF on warfarin PTA for hx Afib. The patient is noted to have a recently elevated INR and was told to hold doses on 5/11 and 5/12 and resume a lower dose of 2 mg daily on 5/13 (previously was on 2.5 mg daily).   INR today is SUPRAtherapeutic (INR 3.8 << 2.86, goal of 2-3). Hgb 7.7 << 8.5, plts wnl. Pt noted to be FOB on 5/13 with blood in stool. No further incidences noted. Will hold warfarin today and monitor s/sx of bleeding closely. LVQ started on 5/14 which can increase warfarin sensitivity.  Goal of Therapy:  INR 2-3   Plan:  1. Hold warfarin today 2. Will continue to monitor for any signs/symptoms of bleeding and will follow up with PT/INR in the a.m.   Alycia Rossetti, PharmD, BCPS Clinical Pharmacist Pager: [phone] 06/22/2013 11:02 AM

## 2013-06-22 NOTE — Progress Notes (Signed)
A Palliative Medicine Consult has been received for Patient NI:DPOEUM A Percifield      DOB: [birth year]      PNT:[id number].We are attempting to respond as quickly as possible to your patient and families needs, however, due to high volume or provider shortage we have not been able to service you in a more timely manner.   We will get this scheduled as quickly as possible.  If in the meantime we can assist with symptom management or other questions via phone please do not hesitate to call our PMT cell phone [phone].   We are sorry for the delay,   Haydin Dunn L. Lovena Le, MD MBA The Palliative Medicine Team at [facility] Phone: [phone] Pager: [phone]

## 2013-06-22 NOTE — Telephone Encounter (Signed)
Follow up ° ° ° ° °Returned a nurses call °

## 2013-06-22 NOTE — Discharge Summary (Addendum)
Physician Discharge Summary  [name] ERX:[id number] DOB: 11-29-1934 DOA: 06/19/2013  PCP: Osborne Casco, MD  Admit date: 06/19/2013 Discharge date: 06/26/2013  Time spent: 35 minutes  Recommendations for Outpatient Follow-up:  1. Back to SNF with Rehab, avoid higher dose of narcotics. 2. Follow up with card in 2 weeks. 3. Follow up with GI in 2-4 weeks and recomend when to start coumadin. 4. Follow up with rheumatologist in 4 weeks for rash. 5.  BNP    Component Value Date/Time   PROBNP 6129.0* 06/22/2013 0621   Filed Weights   06/24/13 0400 06/25/13 0459 06/26/13 0543  Weight: 106 kg (233 lb 11 oz) 105.416 kg (232 lb 6.4 oz) 105.3 kg (232 lb 2.3 oz)     Discharge Diagnoses:  Principal Problem:   PNA (pneumonia) Active Problems:   Systolic and diastolic CHF, chronic   Chronic pain   Hyponatremia   Atrial fibrillation   Long term current use of anticoagulant   Encounter for therapeutic drug monitoring   Pulmonary hypertension   Chronic respiratory failure with hypoxia   Rash and nonspecific skin eruption   Acute lower GI bleeding   Discharge Condition: stable  Diet recommendation: low sodium fluid restricted diet  History of present illness:  79 yo female snf h/o htn, chf, afib, recent inr over 10, comes in with several days of worsening sob. Also several days of this urticarial rash on her upper chest and arms. Denies any recent medications. She has been sob with coughing for several days. Denies any fevers. No cp. No swelling in her legs. Cough is productive. She had a recent hospitalization for inr over 10 and dehydration. She has been in snf since. No n/v/d. She denies any recent use of abx. She has allergies to  pcn caused rash and tongue swelling.    Hospital Course:  Sepsis due HCAP:  - Started empirically on Vanc and aztreonam. Blood cultures negative till date.  - De-escalate to Levaquin to complete 7 days course.  - Afebrile overnight No  leukocytosis.  - SLP evaluation Minimal risk of aspiration.   Acute encephalopathy:  - Now resovled.  - Probably due to narcotics.  - Was given narcan overnight with improvement in her mental status. She received fentanyl and Vicodin for pain.  - D'cd narcotics. Ct head negative for bleed she is on coumadin.  - Awake start low dose narcotics for pain.   Rash and nonspecific skin eruption:  - Unclear etiology. Started on steroids, per ID note she did have biopsy on 11.2015. Was empirically on steroids at that time. Rash was improving.  - Biopsy shows acute inflammation, no viral infection.  - Seen by rheumatologist and dermatologist in 2011 recommended steroids. As per MD note from 11.2011 note there was a suspicion for low-level Stevens-Johnson versus bullous pemphigus.  - de-escalate steroids to orals. Will need follow up with rheumatology.   Acute anemia in setting of Anemia of chronic disease:  - Baseline Hgb appears to be 11 (Feb 2015).  - Pt passing blood clots , most rectal bleeding.  - Held coumadin, given vit K. INR came down to 1.5.  - She does have a history of diverticulosis by CT on 4.26.2015.  - Transfuse 1 unit of PRBC. Repeated CBC Hbg 6.7then transfuse 2 unit of PRBC check a CBC post transfusional.  - consulted GI recommended conservative management. Resume coumadin in 2 weeks. ? ISchemic colitis.   Hyponatremia/AKI:  - she was on lasix and metolazone.  Appears to be dehydrated by physcial exam.  - Worsening Cr most likely due to bleed.  - start IV fluids.   Thrombocytosis:  - unclear etiology ? Reactive.  - monitor.   Chronic respiratory failure with hypoxia:  - at baseline cont. O2L.   Chronic Afib  - Rate controlled.  - held INR as above.   Hx of Chronic systolic and diastolic CHF  - TTE 123XX123: EF 50-55%.  - cont home meds   H/o Pulmonary hypertension:  - echo on 2015 showed EF 60 %.    Procedures:  CT head: negative for  bleed.  Consultations:  None  Discharge Exam: Filed Vitals:   06/26/13 0543  BP: 132/54  Pulse: 66  Temp: 97.8 F (36.6 C)  Resp: 18    General: A&O x3 Cardiovascular: RRR Respiratory: good air movement CTA B/L  Discharge Instructions      Discharge Instructions   Diet - low sodium heart healthy    Complete by:  As directed      Diet - low sodium heart healthy    Complete by:  As directed      Increase activity slowly    Complete by:  As directed      Increase activity slowly    Complete by:  As directed             Medication List    STOP taking these medications       metolazone 5 MG tablet  Commonly known as:  ZAROXOLYN     warfarin 2 MG tablet  Commonly known as:  COUMADIN      TAKE these medications       carvedilol 6.25 MG tablet  Commonly known as:  COREG  Take 6.25 mg by mouth 2 (two) times daily with a meal.     docusate sodium 100 MG capsule  Commonly known as:  COLACE  Take 100 mg by mouth daily.     feeding supplement (PRO-STAT SUGAR FREE 64) Liqd  Take 30 mLs by mouth 2 (two) times daily. 8am and 4:30pm     FERRETTS 325 (106 FE) MG Tabs tablet  Generic drug:  ferrous fumarate  Take 1 tablet by mouth 2 (two) times daily. 8am and 4pm     ferrous sulfate 325 (65 FE) MG EC tablet  Take 1 tablet (325 mg total) by mouth 3 (three) times daily with meals.     furosemide 80 MG tablet  Commonly known as:  LASIX  Take 80 mg by mouth daily.     furosemide 40 MG tablet  Commonly known as:  LASIX  Take 40 mg by mouth every evening. Daily at 4pm     HYDROcodone-acetaminophen 5-325 MG per tablet  Commonly known as:  NORCO/VICODIN  Take 1 tablet by mouth every 8 (eight) hours as needed (pain).     levofloxacin 750 MG tablet  Commonly known as:  LEVAQUIN  Take 1 tablet (750 mg total) by mouth every other day.     lisinopril 5 MG tablet  Commonly known as:  PRINIVIL,ZESTRIL  Take 5 mg by mouth daily.     magnesium hydroxide 400 MG/5ML  suspension  Commonly known as:  MILK OF MAGNESIA  Take 30 mLs by mouth daily as needed for mild constipation.     pantoprazole 40 MG tablet  Commonly known as:  PROTONIX  Take 1 tablet (40 mg total) by mouth 2 (two) times daily.     predniSONE 20 MG  tablet  Commonly known as:  DELTASONE  Take 2 tablets (40 mg total) by mouth daily with breakfast.     traMADol 50 MG tablet  Commonly known as:  ULTRAM  Take 50 mg by mouth every 8 (eight) hours as needed (pain).       Allergies  Allergen Reactions  . Penicillins Other (See Comments)    unknown  . Tetanus-Diphtheria Toxoids Td Other (See Comments)    unknown  . Latex Hives and Rash   Follow-up Information   Follow up with Minus Breeding, MD On 07/11/2013. (hospital follow up Wednesday, June 3rd @ 11:50 AM with Richardson Dopp, PA)    Specialty:  Cardiology   Contact information:   [id number] [place] [phone]        The results of significant diagnostics from this hospitalization (including imaging, microbiology, ancillary and laboratory) are listed below for reference.    Significant Diagnostic Studies: Ct Head Wo Contrast  06/20/2013   CLINICAL DATA:  Confusion.  Altered mental status.  EXAM: CT HEAD WITHOUT CONTRAST  TECHNIQUE: Contiguous axial images were obtained from the base of the skull through the vertex without intravenous contrast.  COMPARISON:  CT HEAD W/O CM dated 06/03/2013  FINDINGS: No mass lesion, mass effect, midline shift, hydrocephalus, hemorrhage. No acute territorial cortical ischemia/infarct. Atrophy and chronic ischemic white matter disease is present. Mucous retention cyst/polyp is present in the right maxillary sinus.  IMPRESSION: Atrophy and chronic ischemic white matter disease without acute intracranial abnormality.   Electronically Signed   By: Dereck Ligas M.D.   On: 06/20/2013 13:27   Ct Head Wo Contrast  06/03/2013   CLINICAL DATA:  Lethargy.  Altered mental status.   EXAM: CT HEAD WITHOUT CONTRAST  TECHNIQUE: Contiguous axial images were obtained from the base of the skull through the vertex without intravenous contrast.  COMPARISON:  None.  FINDINGS: There is no evidence of acute infarction, mass lesion, or intra- or extra-axial hemorrhage on CT.  Diffuse periventricular and subcortical white matter change likely reflects small vessel ischemic microangiopathy. Prominence of the ventricles suggests mild cortical volume loss. Mild cerebellar atrophy is noted.  The brainstem and fourth ventricle are within normal limits. The basal ganglia are unremarkable in appearance. The cerebral hemispheres demonstrate grossly normal gray-white differentiation. No mass effect or midline shift is seen.  There is no evidence of fracture; visualized osseous structures are unremarkable in appearance. The visualized portions of the orbits are within normal limits. A small mucus retention cyst or polyp is noted within the right maxillary sinus. The remaining paranasal sinuses and mastoid air cells are well-aerated. No significant soft tissue abnormalities are seen.  IMPRESSION: 1. No acute intracranial pathology seen on CT. 2. Mild cortical volume loss and diffuse small vessel ischemic microangiopathy. 3. Small mucus retention cyst or polyp within the right maxillary sinus.   Electronically Signed   By: Garald Balding M.D.   On: 06/03/2013 21:24   Ct Abdomen Pelvis W Contrast  06/03/2013   CLINICAL DATA:  Abdominal pain and vomiting.  Weakness.  EXAM: CT ABDOMEN AND PELVIS WITH CONTRAST  TECHNIQUE: Multidetector CT imaging of the abdomen and pelvis was performed using the standard protocol following bolus administration of intravenous contrast.  CONTRAST:  80 mL Omnipaque 300  COMPARISON:  06/26/2012  FINDINGS: Minimal scarring and/or atelectasis is noted in the lung bases. Heart is incompletely visualized but appears enlarged.  Streak artifact is present in the upper abdomen due to patient's  inability to raise her arms. The liver appears slightly heterogeneous in density, however this may be due to superimposed streak artifact. No definite focal liver lesion is identified. The gallbladder, spleen, right adrenal gland, and pancreas have an unremarkable enhanced appearance. There is mild diffuse thickening of the left adrenal gland. Small right lower pole renal cysts measure 1.4 and 1.7 cm, similar to the prior study. Multiple left renal cysts also do not appear significantly changed, the largest measuring 3.3 cm and arising from the lower pole. There is no hydronephrosis.  There is a small hiatal hernia. Oral contrast is present in nondilated loops of small bowel to the level the proximal colon without evidence of obstruction. There is left-sided colonic diverticulosis without evidence of diverticulitis. Appendix is not clearly identified, however no inflammatory changes are present in the right lower quadrant.  Uterus is absent. Ovaries are unremarkable. Bladder is unremarkable. Mild to moderate aortoiliac atherosclerotic calcification is noted. No free fluid or enlarged lymph nodes are identified. Multilevel degenerative disc disease with vacuum disc phenomenon is noted in the lower thoracic spine. Mild multilevel degenerative disc disease and facet arthrosis are present in the lower lumbar spine, with unchanged grade 1 anterolisthesis of L4 on L5.  IMPRESSION: No acute abnormality identified in the abdomen or pelvis. No etiology for abdominal pain identified.   Electronically Signed   By: Logan Bores   On: 06/03/2013 15:31   Dg Chest Portable 1 View  06/19/2013   CLINICAL DATA:  Shortness of breath  EXAM: PORTABLE CHEST - 1 VIEW  COMPARISON:  06/03/2013  FINDINGS: Patchy lingular opacity, suspicious for pneumonia.  Possible mild patchy right perihilar opacity, equivocal. Eventration of the left hemidiaphragm. No pleural effusion or pneumothorax.  Cardiomegaly.  IMPRESSION: Suspected lingular  pneumonia.   Electronically Signed   By: Julian Hy M.D.   On: 06/19/2013 19:21   Dg Chest Port 1 View  06/03/2013   CLINICAL DATA:  Dyspnea  EXAM: PORTABLE CHEST - 1 VIEW  COMPARISON:  03/22/2013  FINDINGS: Increased interstitial markings/bronchitic changes. No focal consolidation. No pleural effusion or pneumothorax.  The heart is top-normal in size.  IMPRESSION: No evidence of acute cardiopulmonary disease.   Electronically Signed   By: Julian Hy M.D.   On: 06/03/2013 18:49   US Abdomen Limited Ruq  06/06/2013   CLINICAL DATA:  Upper abdominal pain and abnormal liver enzymes  EXAM: US ABDOMEN LIMITED - RIGHT UPPER QUADRANT  COMPARISON:  None.  FINDINGS: Gallbladder:  No gallstones or wall thickening visualized. There is no pericholecystic fluid. Gallbladder distention appears within normal limits. No sonographic Murphy sign noted.  Common bile duct:  Diameter: 4 mm. There is no intrahepatic or extrahepatic biliary duct dilatation.  Liver:  No focal lesion identified. Within normal limits in parenchymal echogenicity.  IMPRESSION: Study within normal limits.   Electronically Signed   By: Lowella Grip M.D.   On: 06/06/2013 08:20    Microbiology: Recent Results (from the past 240 hour(s))  MRSA PCR SCREENING     Status: None   Collection Time    06/20/13  1:07 AM      Result Value Ref Range Status   MRSA by PCR NEGATIVE  NEGATIVE Final   Comment:            The GeneXpert MRSA Assay (FDA     approved for NASAL specimens     only), is one component of a     comprehensive MRSA colonization  surveillance program. It is not     intended to diagnose MRSA     infection nor to guide or     monitor treatment for     MRSA infections.  CULTURE, BLOOD (ROUTINE X 2)     Status: None   Collection Time    06/20/13  4:20 AM      Result Value Ref Range Status   Specimen Description BLOOD LEFT ARM   Final   Special Requests BOTTLES DRAWN AEROBIC ONLY 5CC   Final   Culture  Setup  Time     Final   Value: 06/20/2013 12:01     Performed at [facility]   Culture     Final   Value: NO GROWTH 5 DAYS     Performed at [facility]   Report Status 06/26/2013 FINAL   Final  CULTURE, BLOOD (ROUTINE X 2)     Status: None   Collection Time    06/20/13  4:28 AM      Result Value Ref Range Status   Specimen Description BLOOD LEFT HAND   Final   Special Requests BOTTLES DRAWN AEROBIC AND ANAEROBIC 10CC EACH   Final   Culture  Setup Time     Final   Value: 06/20/2013 12:01     Performed at [facility]   Culture     Final   Value: NO GROWTH 5 DAYS     Performed at [facility]   Report Status 06/26/2013 FINAL   Final     Labs: Basic Metabolic Panel:  Recent Labs Lab 06/22/13 0414 06/23/13 0106 06/24/13 0750 06/25/13 0422 06/26/13 0445  NA 127* 130* 128* 126* 136*  K 4.8 5.1 5.1 5.1 5.1  CL 92* 95* 92* 91* 103  CO2 27 25 25 25 27   GLUCOSE 82 147* 82 91 75  BUN 45* 36* 50* 47* 32*  CREATININE 1.34* 1.11* 1.77* 1.45* 1.13*  CALCIUM 7.6* 7.3* 7.6* 7.4* 7.5*   Liver Function Tests:  Recent Labs Lab 06/19/13 2050  AST 18  ALT 7  ALKPHOS 112  BILITOT 0.3  PROT 6.6  ALBUMIN 2.7*   No results found for this basename: LIPASE, AMYLASE,  in the last 168 hours No results found for this basename: AMMONIA,  in the last 168 hours CBC:  Recent Labs Lab 06/19/13 2050 06/20/13 0428  06/23/13 0106 06/24/13 0750 06/24/13 2026 06/25/13 0422 06/26/13 0445  WBC 8.0 6.9  < > 8.8 20.1* 17.5* 17.9* 15.2*  NEUTROABS 6.8 6.1  --   --   --  15.8*  --   --   HGB 9.0* 9.0*  < > 7.1*  7.1* 6.7* 7.8* 7.7* 7.7*  HCT 27.5* 27.9*  < > 22.7*  22.6* 19.8* 23.0* 22.9* 23.1*  MCV 92.3 93.0  < > 96.6 90.4 88.8 89.8 92.0  PLT 433* 430*  < > 367 286 265 283 290  < > = values in this interval not displayed. Cardiac Enzymes:  Recent Labs Lab 06/19/13 2050  CKTOTAL 61  TROPONINI <0.30   BNP: BNP (last 3 results)  Recent Labs   07/13/12 1121 06/03/13 1825 06/22/13 0621  PROBNP 106.0* 27115.0* 6129.0*   CBG:  Recent Labs Lab 06/25/13 1120 06/25/13 1600 06/25/13 2109 06/26/13 0639 06/26/13 0731  GLUCAP 115* 119* 176* 62* 85       Signed:  Charlynne Cousins  [facility] 06/26/2013, 10:33 AM

## 2013-06-22 NOTE — Evaluation (Signed)
Clinical/Bedside Swallow Evaluation Patient Details  Name: [name] MRN: [id number] Date of Birth: 11-Jun-1934  Today's Date: 06/22/2013 Time: 7517-0017 SLP Time Calculation (min): 23 min  Past Medical History:  Past Medical History  Diagnosis Date  . HTN (hypertension)   . Diverticulosis   . Hemorrhoid   . Osteoarthritis   . Constipation, chronic   . H/O: hysterectomy   . Systolic and diastolic CHF, chronic     a. Echo 07/2011: Technically limited; inferior HK, inferolateral and possibly lateral wall HK, EF 40%, moderate LVE, aortic sclerosis without stenosis, mild MR, mild LAE, question RV dysfunction;   b.  Echo (04/2013): EF 50-55%, normal wall motion, moderate MR, mild to moderate LAE, PASP 37  . Female bladder prolapse     with prolapse uterus, cystocele, s/p TVH/BSO 12/2006  . Obesity (BMI 30-39.9)     wt. 94.1 kg 12/2010  . Moderate mitral regurgitation     a. Mod to Sev by echo 01/2009;  b. mild MR by echo 2013  . Atrial fibrillation 10/13/2011    coumadin Rx  . Iron deficiency anemia    Past Surgical History:  Past Surgical History  Procedure Laterality Date  . Wrist surgery    . Vaginal hysterectomy,cystocele and prolapse  repair  NOV. 2008  . Abdominal hysterectomy     HPI:  79 yo female snf h/o htn, chf, afib, recent inr over 10, comes in with several days of worsening sob. Also several days of this urticarial rash on her upper chest and arms. Patchy lingular opacity, suspicious for pneumonia.   Assessment / Plan / Recommendation Clinical Impression  Pt demonstrates mild oral impairment related to missing dentition. Pt is able to masticate soft meats on breakfast tray with extra time. She does have a a baseline cough and occasional expectoration of mucous, unrelated to PO intake. There is no direct evidence of aspiration. Recommend pt continue current diet with basic aspiration precautions, primarily upright posture. No SLP f/u warranted, will sign off.      Aspiration Risk  Mild    Diet Recommendation Regular;Thin liquid   Liquid Administration via: Cup;Straw Medication Administration: Whole meds with liquid Supervision: Patient able to self feed Compensations: Slow rate;Small sips/bites Postural Changes and/or Swallow Maneuvers: Seated upright 90 degrees    Other  Recommendations Oral Care Recommendations: Oral care BID   Follow Up Recommendations  None    Frequency and Duration        Pertinent Vitals/Pain NA    SLP Swallow Goals     Swallow Study Prior Functional Status       General HPI: 79 yo female snf h/o htn, chf, afib, recent inr over 11, comes in with several days of worsening sob. Also several days of this urticarial rash on her upper chest and arms. Patchy lingular opacity, suspicious for pneumonia. Type of Study: Bedside swallow evaluation Previous Swallow Assessment: none Diet Prior to this Study: Regular;Thin liquids Temperature Spikes Noted: No Respiratory Status: Nasal cannula History of Recent Intubation: No Behavior/Cognition: Alert;Cooperative Oral Cavity - Dentition: Poor condition;Missing dentition Self-Feeding Abilities: Able to feed self Patient Positioning: Upright in bed Baseline Vocal Quality: Wet;Low vocal intensity;Hoarse (mucous) Volitional Cough: Strong Volitional Swallow: Able to elicit    Oral/Motor/Sensory Function Overall Oral Motor/Sensory Function: Appears within functional limits for tasks assessed   Ice Chips     Thin Liquid Thin Liquid: Within functional limits Presentation: Straw;Self Fed    Nectar Thick Nectar Thick Liquid: Not tested  Honey Thick Honey Thick Liquid: Not tested   Puree Puree: Within functional limits   Solid   GO    Solid: Impaired Presentation: Self Fed Oral Phase Impairments: Impaired mastication      Herbie Baltimore, MA CCC-SLP [place] [facility] Dicola 06/22/2013,8:47 AM

## 2013-06-22 NOTE — Progress Notes (Signed)
[facility] PROGRESS NOTE   Assessment/Plan: Sepsis due HCAP: - Started empirically on Vanc and aztreonam. Blood cultures negative till date. - De-escalate to Levaquin to complete 7 days course. - Afebrile overnight No leukocytosis. - SLP evaluation Minimal risk of aspiration.  Acute encephalopathy: - now resovled. - Probably due to narcotics. - Was given narcan overnight with improvement in her mental status. She received fentanyl and Vicodin for pain. - D'cd narcotics, NPO, upright position. Ct head negative for bleed she is on coumadin. - Awake start low dose narcotics for pain.  Rash and nonspecific skin eruption: - Unclear etiology. Started on steroids, per ID note she did have biopsy on 11.2015. Was empirically on steroids at that time. Rash was improving. - This rash is not related to levaquin. - Biopsy shows acute inflammation, no viral infection. - Seen by rheumatologist and dermatologist  in 2011 recommended steroids. As per MD note from 11.2011 note there was a suspicion for low-level Stevens-Johnson versus bullous pemphigus. - de-escalate steroids to orals.  Hyponatremia/AKI: - she was on lasix and metolazone. Appears to be dehydrated by physcial exam. - Cont IV fluids gently. - b-met in am.  Thrombocytosis: - unclear etiology ? Reactive. - monitor.  Chronic respiratory failure with hypoxia: - at baseline cont. O2L.  Acute anemia in setting of Anemia of chronic disease  - Baseline Hgb appears to be 11 (Feb 2015)   Chronic Afib  - Rate controlled.  - INR therapeutic cont coumadin   Hx of Chronic systolic and diastolic CHF  - TTE 04/90: EF 50-55%.  - cont home meds  H/o  Pulmonary hypertension: - echo on 2015 showed EF 60 %.    Code Status: FULL  Family Communication: spoke w/ pt and son at bedside  Disposition Plan: inpatinet  Consultants:  none  Procedures:  CT head  CXR  Antibiotics:  vanc aztreonam  5.2.2015  HPI/Subjective: Complaining of pain all over. Except for pain she feel better.  Objective: Filed Vitals:   06/21/13 1455 06/21/13 2104 06/22/13 0457 06/22/13 1110  BP: 95/41 99/53 104/57 96/48  Pulse: 67 72 68 65  Temp: 97.7 F (36.5 C) 98.3 F (36.8 C) 98 F (36.7 C) 98.1 F (36.7 C)  TempSrc: Oral Oral Oral Oral  Resp: _0 Height:      Weight:      SpO2: 100% 94% 95% 100%    Intake/Output Summary (Last 24 hours) at 06/22/13 1150 Last data filed at 06/22/13 0034  Gross per 24 hour  Intake 686.25 ml  Output      0 ml  Net 686.25 ml   Filed Weights   06/20/13 0103 06/21/13 0540 06/21/13 0542  Weight: 100.699 kg (222 lb) 104.418 kg (230 lb 3.2 oz) 104.146 kg (229 lb 9.6 oz)    Exam:  General: Alert, awake, oriented x3, in no acute distress.  HEENT: No bruits, no goiter. Skin: macular papular rash. On chest and extremities. Heart: Regular rate and rhythm, without murmurs, rubs, gallops.  Lungs: Good air movement, bilateral air movement.  Abdomen: Soft, nontender, nondistended, positive bowel sounds.    Data Reviewed: Basic Metabolic Panel:  Recent Labs Lab 06/19/13 2050 06/20/13 0428 06/21/13 0953 06/22/13 0414  NA 124* 125* 126* 127*  K 3.9 3.3* 5.1 4.8  CL 83* 84* 88* 92*  CO2 _1 GLUCOSE 91 102* 176* 82  BUN 52* 45* 48* 45*  CREATININE 1.43* 1.28* 1.29* 1.34*  CALCIUM 7.4*  7.5* 8.1* 7.6*   Liver Function Tests:  Recent Labs Lab 06/19/13 2050  AST 18  ALT 7  ALKPHOS 112  BILITOT 0.3  PROT 6.6  ALBUMIN 2.7*   No results found for this basename: LIPASE, AMYLASE,  in the last 168 hours No results found for this basename: AMMONIA,  in the last 168 hours CBC:  Recent Labs Lab 06/19/13 2050 06/20/13 0428 06/21/13 0500 06/22/13 0414  WBC 8.0 6.9 14.6* 11.3*  NEUTROABS 6.8 6.1  --   --   HGB 9.0* 9.0* 8.5* 7.7*  HCT 27.5* 27.9* 26.7* 24.6*  MCV 92.3 93.0 94.0 95.3  PLT 433* 430* 391 395   Cardiac  Enzymes:  Recent Labs Lab 06/19/13 2050  CKTOTAL 61  TROPONINI <0.30   BNP (last 3 results)  Recent Labs  06/27/12 1129 07/13/12 1121 06/03/13 1825  PROBNP 4914.0* 106.0* 27115.0*   CBG:  Recent Labs Lab 06/21/13 1143 06/21/13 1637 06/21/13 2156 06/22/13 0707 06/22/13 1111  GLUCAP 125* 124* 94 83 84    Recent Results (from the past 240 hour(s))  MRSA PCR SCREENING     Status: None   Collection Time    06/20/13  1:07 AM      Result Value Ref Range Status   MRSA by PCR NEGATIVE  NEGATIVE Final   Comment:            The GeneXpert MRSA Assay (FDA     approved for NASAL specimens     only), is one component of a     comprehensive MRSA colonization     surveillance program. It is not     intended to diagnose MRSA     infection nor to guide or     monitor treatment for     MRSA infections.  CULTURE, BLOOD (ROUTINE X 2)     Status: None   Collection Time    06/20/13  4:20 AM      Result Value Ref Range Status   Specimen Description BLOOD LEFT ARM   Final   Special Requests BOTTLES DRAWN AEROBIC ONLY 5CC   Final   Culture  Setup Time     Final   Value: 06/20/2013 12:01     Performed at [facility]   Culture     Final   Value:        BLOOD CULTURE RECEIVED NO GROWTH TO DATE CULTURE WILL BE HELD FOR 5 DAYS BEFORE ISSUING A FINAL NEGATIVE REPORT     Performed at [facility]   Report Status PENDING   Incomplete  CULTURE, BLOOD (ROUTINE X 2)     Status: None   Collection Time    06/20/13  4:28 AM      Result Value Ref Range Status   Specimen Description BLOOD LEFT HAND   Final   Special Requests BOTTLES DRAWN AEROBIC AND ANAEROBIC 10CC EACH   Final   Culture  Setup Time     Final   Value: 06/20/2013 12:01     Performed at [facility]   Culture     Final   Value:        BLOOD CULTURE RECEIVED NO GROWTH TO DATE CULTURE WILL BE HELD FOR 5 DAYS BEFORE ISSUING A FINAL NEGATIVE REPORT     Performed at [facility]   Report Status  PENDING   Incomplete     Studies: No results found.  Scheduled Meds: . carvedilol  6.25 mg Oral BID  WC  . docusate sodium  100 mg Oral Daily  . levofloxacin  750 mg Oral Q48H  . lisinopril  5 mg Oral Daily  . Warfarin - Pharmacist Dosing Inpatient   Does not apply q1800   Continuous Infusions: . sodium chloride 100 mL/hr at 06/22/13 [place] Pager [phone]. If 8PM-8AM, please contact night-coverage at [email or web address], password [facility] 06/22/2013, 11:50 AM  LOS: 3 days

## 2013-06-22 NOTE — Care Management Note (Signed)
    Page 1 of 1   06/22/2013     11:51:22 AM CARE MANAGEMENT NOTE 06/22/2013  Patient:  [name], [name]   Account Number:  [id number]  Date Initiated:  06/22/2013  Documentation initiated by:  [facility]  Subjective/Objective Assessment:   79 yo female snf h/o htn, chf, afib, recent inr over 10, comes in with several days of worsening sob.  Also several days of this urticarial rash on her upper chest and arms.//From [facility].     Action/Plan:   benadryl and solumedrol iv 125 mg.//Return to [facility]   Anticipated DC Date:  06/25/2013   Anticipated DC Plan:  SKILLED NURSING FACILITY  In-house referral  Clinical Social Worker      DC Planning Services  CM consult      Choice offered to / List presented to:             Status of service:  In process, will continue to follow Medicare Important Message given?  YES (If response is "NO", the following Medicare IM given date fields will be blank) Date Medicare IM given:  06/22/2013 Date Additional Medicare IM given:    Discharge Disposition:    Per UR Regulation:    If discussed at Long Length of Stay Meetings, dates discussed:    Comments:

## 2013-06-22 NOTE — Clinical Social Work Note (Signed)
Clinical Social Work Department BRIEF PSYCHOSOCIAL ASSESSMENT 06/22/2013  Patient:  [name], [name]     Account Number:  [id number]     Admit date:  06/19/2013  Clinical Social Worker:  Myles Lipps  Date/Time:  06/22/2013 03:00 PM  Referred by:  Physician  Date Referred:  06/22/2013 Referred for  SNF Placement   Other Referral:   Interview type:  Patient Other interview type:   Patient requested contact with her children - left message for both son and daughter    PSYCHOSOCIAL DATA Living Status:  FACILITY Admitted from facility:  [facility] Level of care:  [facility] Primary support name:  Ruiter,[name]  [phone] Primary support relationship to patient:  CHILD, ADULT Degree of support available:   Adequate    CURRENT CONCERNS Current Concerns  Post-Acute Placement   Other Concerns:    SOCIAL WORK ASSESSMENT / PLAN Clinical Social Worker met with patient at bedside to discuss patient needs at discharge.  Patient states that she is a resident at [facility] and plans to return once medically ready.  Patient requested contact be made with her children regarding discharge plans - CSW left a message for both patient son and daughter to further discuss patient plans at discharge with no return call.  CSW spoke with facility and provided requested discharge summary for weekend discharge.  Patient is able to return on Saturday 05/16 if deemed medically stable.  CSW remains available for support and to facilitate patient discharge needs once medically ready.   Assessment/plan status:  Psychosocial Support/Ongoing Assessment of Needs Other assessment/ plan:   Information/referral to community resources:   Holiday representative offered patient community resources, however patient states that she is content at [facility] unless her children speak otherwise.    PATIENT'S/FAMILY'S RESPONSE TO PLAN OF CARE: Patient alert and oriented x3 laying  in bed.  Patient is agreeable to return to [facility].  Patient states that her children provide her with good support.  CSW unable to reach patient family but will try again prior to discharge.  Patient verbalized appreciation for CSW support and involvement.

## 2013-06-23 ENCOUNTER — Encounter (HOSPITAL_COMMUNITY): Payer: Self-pay | Admitting: Obstetrics and Gynecology

## 2013-06-23 DIAGNOSIS — N179 Acute kidney failure, unspecified: Secondary | ICD-10-CM

## 2013-06-23 DIAGNOSIS — I10 Essential (primary) hypertension: Secondary | ICD-10-CM

## 2013-06-23 DIAGNOSIS — G929 Unspecified toxic encephalopathy: Secondary | ICD-10-CM

## 2013-06-23 DIAGNOSIS — I4891 Unspecified atrial fibrillation: Secondary | ICD-10-CM

## 2013-06-23 DIAGNOSIS — J189 Pneumonia, unspecified organism: Secondary | ICD-10-CM

## 2013-06-23 DIAGNOSIS — A419 Sepsis, unspecified organism: Secondary | ICD-10-CM

## 2013-06-23 DIAGNOSIS — G92 Toxic encephalopathy: Secondary | ICD-10-CM

## 2013-06-23 DIAGNOSIS — K922 Gastrointestinal hemorrhage, unspecified: Secondary | ICD-10-CM | POA: Diagnosis not present

## 2013-06-23 LAB — PROTIME-INR
INR: 3.43 — ABNORMAL HIGH (ref 0.00–1.49)
Prothrombin Time: 33.3 seconds — ABNORMAL HIGH (ref 11.6–15.2)

## 2013-06-23 LAB — RETICULOCYTES
RBC.: 1.95 MIL/uL — ABNORMAL LOW (ref 3.87–5.11)
RETIC CT PCT: 1.4 % (ref 0.4–3.1)
Retic Count, Absolute: 27.3 10*3/uL (ref 19.0–186.0)

## 2013-06-23 LAB — GLUCOSE, CAPILLARY
GLUCOSE-CAPILLARY: 137 mg/dL — AB (ref 70–99)
GLUCOSE-CAPILLARY: 121 mg/dL — AB (ref 70–99)
Glucose-Capillary: 123 mg/dL — ABNORMAL HIGH (ref 70–99)

## 2013-06-23 LAB — CBC
HCT: 22.7 % — ABNORMAL LOW (ref 36.0–46.0)
Hemoglobin: 7.1 g/dL — ABNORMAL LOW (ref 12.0–15.0)
MCH: 30.2 pg (ref 26.0–34.0)
MCHC: 31.3 g/dL (ref 30.0–36.0)
MCV: 96.6 fL (ref 78.0–100.0)
PLATELETS: 367 10*3/uL (ref 150–400)
RBC: 2.35 MIL/uL — AB (ref 3.87–5.11)
RDW: 14.2 % (ref 11.5–15.5)
WBC: 8.8 10*3/uL (ref 4.0–10.5)

## 2013-06-23 LAB — HEMOGLOBIN AND HEMATOCRIT, BLOOD
HCT: 22.6 % — ABNORMAL LOW (ref 36.0–46.0)
HEMOGLOBIN: 7.1 g/dL — AB (ref 12.0–15.0)

## 2013-06-23 LAB — IRON AND TIBC
Iron: 94 ug/dL (ref 42–135)
SATURATION RATIOS: 57 % — AB (ref 20–55)
TIBC: 166 ug/dL — ABNORMAL LOW (ref 250–470)
UIBC: 72 ug/dL — AB (ref 125–400)

## 2013-06-23 LAB — VITAMIN B12: Vitamin B-12: 245 pg/mL (ref 211–911)

## 2013-06-23 LAB — BASIC METABOLIC PANEL
BUN: 36 mg/dL — ABNORMAL HIGH (ref 6–23)
CHLORIDE: 95 meq/L — AB (ref 96–112)
CO2: 25 mEq/L (ref 19–32)
Calcium: 7.3 mg/dL — ABNORMAL LOW (ref 8.4–10.5)
Creatinine, Ser: 1.11 mg/dL — ABNORMAL HIGH (ref 0.50–1.10)
GFR calc Af Amer: 54 mL/min — ABNORMAL LOW (ref >90)
GFR calc non Af Amer: 47 mL/min — ABNORMAL LOW (ref >90)
Glucose, Bld: 147 mg/dL — ABNORMAL HIGH (ref 70–99)
POTASSIUM: 5.1 meq/L (ref 3.7–5.3)
Sodium: 130 mEq/L — ABNORMAL LOW (ref 137–147)

## 2013-06-23 LAB — ABO/RH: ABO/RH(D): O POS

## 2013-06-23 LAB — FERRITIN: Ferritin: 350 ng/mL — ABNORMAL HIGH (ref 10–291)

## 2013-06-23 LAB — FOLATE: FOLATE: 3.7 ng/mL

## 2013-06-23 LAB — PREPARE RBC (CROSSMATCH)

## 2013-06-23 MED ORDER — HYDROCODONE-ACETAMINOPHEN 5-325 MG PO TABS
1.0000 | ORAL_TABLET | ORAL | Status: DC | PRN
  Administered 2013-06-23 – 2013-06-24 (×5): 2 via ORAL
  Administered 2013-06-25 – 2013-06-26 (×3): 1 via ORAL
  Filled 2013-06-23: qty 2
  Filled 2013-06-23 (×2): qty 1
  Filled 2013-06-23 (×3): qty 2
  Filled 2013-06-23: qty 1
  Filled 2013-06-23: qty 2

## 2013-06-23 MED ORDER — PREDNISONE 20 MG PO TABS: 40.0000 mg | ORAL_TABLET | Freq: Every day | ORAL | Status: DC

## 2013-06-23 MED ORDER — FERROUS SULFATE 325 (65 FE) MG PO TBEC: 325.0000 mg | DELAYED_RELEASE_TABLET | Freq: Three times a day (TID) | ORAL | Status: DC

## 2013-06-23 MED ORDER — PREDNISONE 10 MG PO TABS: ORAL_TABLET | ORAL | Status: DC

## 2013-06-23 MED ORDER — PREDNISONE 20 MG PO TABS
40.0000 mg | ORAL_TABLET | Freq: Every day | ORAL | Status: DC
  Administered 2013-06-24 – 2013-06-26 (×3): 40 mg via ORAL
  Filled 2013-06-23 (×4): qty 2

## 2013-06-23 MED ORDER — PHYTONADIONE 5 MG PO TABS
5.0000 mg | ORAL_TABLET | Freq: Once | ORAL | Status: AC
  Administered 2013-06-23: 5 mg via ORAL
  Filled 2013-06-23: qty 1

## 2013-06-23 NOTE — Significant Event (Signed)
Throughout the shift the patient was noted to have an abundant amount of bloody vaginal discharge with clots present.  She voices left, lower abdominal pain as well.  Despite the discharge, the patient maintained a SBP in the 110s until this morning when her SBP was noted in the 80s.  She remains alert and oriented without lightheadedness or dizziness present.  NP on call notified of patient's initial condition and change in condition.  Orders obtained to transfuse 1 unit of PRBCs.  Report given to oncoming nurse to be carried out.

## 2013-06-23 NOTE — Progress Notes (Addendum)
[facility] PROGRESS NOTE   Assessment/Plan: Sepsis due HCAP: - Started empirically on Vanc and aztreonam. Blood cultures negative till date. - De-escalate to Levaquin to complete 7 days course. - Afebrile overnight No leukocytosis. - SLP evaluation Minimal risk of aspiration.  Acute encephalopathy: - Now resovled. - Probably due to narcotics. - Was given narcan overnight with improvement in her mental status. She received fentanyl and Vicodin for pain. - D'cd narcotics, NPO, upright position. Ct head negative for bleed she is on coumadin. - Awake start low dose narcotics for pain.  Rash and nonspecific skin eruption: - Unclear etiology. Started on steroids, per ID note she did have biopsy on 11.2015. Was empirically on steroids at that time. Rash was improving. - This rash is not related to levaquin. - Biopsy shows acute inflammation, no viral infection. - Seen by rheumatologist and dermatologist  in 2011 recommended steroids. As per MD note from 11.2011 note there was a suspicion for low-level Stevens-Johnson versus bullous pemphigus. - de-escalate steroids to orals. Will need follow up with rheumatology.  Acute anemia in setting of Anemia of chronic disease:  - Baseline Hgb appears to be 11 (Feb 2015). - Pt passing blood clots which appeared to be from her vagina, GYN consult relates is most rectal bleeding. - Hold coumadin, give vit K. - She does have a history of diverticulosis by CT on 4.26.2015. - Transfuse 1 unit of PRBC. CBC post transfusional.  Hyponatremia/AKI: - she was on lasix and metolazone. Appears to be dehydrated by physcial exam. - Cont IV fluids gently. - b-met in am.  Thrombocytosis: - unclear etiology ? Reactive. - monitor.  Chronic respiratory failure with hypoxia: - at baseline cont. O2L.  Chronic Afib  - Rate controlled.  - held INR as above.  Hx of Chronic systolic and diastolic CHF  - TTE 07/5782: EF 50-55%.  - cont home  meds  H/o  Pulmonary hypertension: - echo on 2015 showed EF 60 %.    Code Status: FULL  Family Communication: spoke w/ pt and son at bedside  Disposition Plan: inpatinet  Consultants:  none  Procedures:  CT head  CXR  Antibiotics:  vanc aztreonam 5.2.2015  HPI/Subjective: Complaining of pain all over.  feels weak, passing blood clots.  Objective: Filed Vitals:   06/22/13 2044 06/22/13 2229 06/23/13 0658 06/23/13 0659  BP: 101/54 1_0  Pulse: 81 74 84   Temp: 98.2 F (36.8 C)  97.6 F (36.4 C)   TempSrc: Oral  Oral   Resp: 18  20   Height:      Weight:   104.146 kg (229 lb 9.6 oz)   SpO2: 100%  98%     Intake/Output Summary (Last 24 hours) at 06/23/13 0759 Last data filed at 06/23/13 0706  Gross per 24 hour  Intake   1200 ml  Output      9 ml  Net   1191 ml   Filed Weights   06/21/13 0540 06/21/13 0542 06/23/13 0658  Weight: 104.418 kg (230 lb 3.2 oz) 104.146 kg (229 lb 9.6 oz) 104.146 kg (229 lb 9.6 oz)    Exam:  General: Alert, awake, oriented x3, in no acute distress.  HEENT: No bruits, no goiter. Skin: macular papular rash. Improving on chest and arms. Heart: Regular rate and rhythm, without murmurs, rubs, gallops.  Abdomen: Soft, nontender, nondistended, positive bowel sounds.    Data Reviewed: Basic Metabolic Panel:  Recent Labs Lab 06/19/13 2050 06/20/13 0428 06/21/13  4944 06/22/13 0414 06/23/13 0106  NA 124* 125* 126* 127* 130*  K 3.9 3.3* 5.1 4.8 5.1  CL 83* 84* 88* 92* 95*  CO2 _0 GLUCOSE 91 102* 176* 82 147*  BUN 52* 45* 48* 45* 36*  CREATININE 1.43* 1.28* 1.29* 1.34* 1.11*  CALCIUM 7.4* 7.5* 8.1* 7.6* 7.3*   Liver Function Tests:  Recent Labs Lab 06/19/13 2050  AST 18  ALT 7  ALKPHOS 112  BILITOT 0.3  PROT 6.6  ALBUMIN 2.7*   No results found for this basename: LIPASE, AMYLASE,  in the last 168 hours No results found for this basename: AMMONIA,  in the last 168 hours CBC:  Recent  Labs Lab 06/19/13 2050 06/20/13 0428 06/21/13 0500 06/22/13 0414 06/23/13 0106  WBC 8.0 6.9 14.6* 11.3* 8.8  NEUTROABS 6.8 6.1  --   --   --   HGB 9.0* 9.0* 8.5* 7.7* 7.1*  7.1*  HCT 27.5* 27.9* 26.7* 24.6* 22.7*  22.6*  MCV 92.3 93.0 94.0 95.3 96.6  PLT 433* 430* 391 395 367   Cardiac Enzymes:  Recent Labs Lab 06/19/13 2050  CKTOTAL 61  TROPONINI <0.30   BNP (last 3 results)  Recent Labs  07/13/12 1121 06/03/13 1825 06/22/13 0621  PROBNP 106.0* 27115.0* 6129.0*   CBG:  Recent Labs Lab 06/21/13 2156 06/22/13 0707 06/22/13 1111 06/22/13 1620 06/22/13 2140  GLUCAP 94 83 84 89 147*    Recent Results (from the past 240 hour(s))  MRSA PCR SCREENING     Status: None   Collection Time    06/20/13  1:07 AM      Result Value Ref Range Status   MRSA by PCR NEGATIVE  NEGATIVE Final   Comment:            The GeneXpert MRSA Assay (FDA     approved for NASAL specimens     only), is one component of a     comprehensive MRSA colonization     surveillance program. It is not     intended to diagnose MRSA     infection nor to guide or     monitor treatment for     MRSA infections.  CULTURE, BLOOD (ROUTINE X 2)     Status: None   Collection Time    06/20/13  4:20 AM      Result Value Ref Range Status   Specimen Description BLOOD LEFT ARM   Final   Special Requests BOTTLES DRAWN AEROBIC ONLY 5CC   Final   Culture  Setup Time     Final   Value: 06/20/2013 12:01     Performed at [facility]   Culture     Final   Value:        BLOOD CULTURE RECEIVED NO GROWTH TO DATE CULTURE WILL BE HELD FOR 5 DAYS BEFORE ISSUING A FINAL NEGATIVE REPORT     Performed at [facility]   Report Status PENDING   Incomplete  CULTURE, BLOOD (ROUTINE X 2)     Status: None   Collection Time    06/20/13  4:28 AM      Result Value Ref Range Status   Specimen Description BLOOD LEFT HAND   Final   Special Requests BOTTLES DRAWN AEROBIC AND ANAEROBIC 10CC EACH   Final    Culture  Setup Time     Final   Value: 06/20/2013 12:01     Performed at [facility]  Culture     Final   Value:        BLOOD CULTURE RECEIVED NO GROWTH TO DATE CULTURE WILL BE HELD FOR 5 DAYS BEFORE ISSUING A FINAL NEGATIVE REPORT     Performed at [facility]   Report Status PENDING   Incomplete     Studies: No results found.  Scheduled Meds: . carvedilol  6.25 mg Oral BID WC  . docusate sodium  100 mg Oral Daily  . levofloxacin  750 mg Oral Q48H  . lisinopril  5 mg Oral Daily  . [START ON 06/24/2013] predniSONE  40 mg Oral Q breakfast  . Warfarin - Pharmacist Dosing Inpatient   Does not apply q1800   Continuous Infusions:     Charlynne Cousins  [facility] Pager [phone]. If 8PM-8AM, please contact night-coverage at [email or web address], password [facility] 06/23/2013, 7:59 AM  LOS: 4 days

## 2013-06-23 NOTE — Consult Note (Signed)
Reason for Consult:Vaginal bleeding in Anticoagulated 50 female, found to have gross rectal bleeding Referring Physician: Olevia Bowens, MD  [name] is an 79 y.o. female. She is admitted for worsening SOB and has developed large amount of bleeding that was thought to be vaginal in origin.  Pertinent Gynecological History: Menses: post-menopausal and s/p hysterectomy , with known cystocele and rectocele Bleeding: began 1-2 days ago, this morining has Gross Clots at perinueum extending from bed up in to vagina Last pap: not in epic Date:  OB History: G6, P6   Menstrual History:  No LMP recorded. Patient has had a hysterectomy. done 12/2006    Past Medical History  Diagnosis Date  . HTN (hypertension)   . Diverticulosis   . Hemorrhoid   . Osteoarthritis   . Constipation, chronic   . H/O: hysterectomy   . Systolic and diastolic CHF, chronic     a. Echo 07/2011: Technically limited; inferior HK, inferolateral and possibly lateral wall HK, EF 40%, moderate LVE, aortic sclerosis without stenosis, mild MR, mild LAE, question RV dysfunction;   b.  Echo (04/2013): EF 50-55%, normal wall motion, moderate MR, mild to moderate LAE, PASP 37  . Female bladder prolapse     with prolapse uterus, cystocele, s/p TVH/BSO 12/2006  . Obesity (BMI 30-39.9)     wt. 94.1 kg 12/2010  . Moderate mitral regurgitation     a. Mod to Sev by echo 01/2009;  b. mild MR by echo 2013  . Atrial fibrillation 10/13/2011    coumadin Rx  . Iron deficiency anemia     Past Surgical History  Procedure Laterality Date  . Wrist surgery    . Vaginal hysterectomy,cystocele and prolapse  repair  NOV. 2008  . Abdominal hysterectomy      Family History  Problem Relation Age of Onset  . Diabetes Brother   . Hypertension Mother     deceased @ 15  . Leukemia Brother   . Cancer Brother   . Stroke Mother   . Diabetes Father     deceased in his 83's    Social History:  reports that she has never smoked. Her smokeless  tobacco use includes Snuff. She reports that she does not drink alcohol or use illicit drugs.  Allergies:  Allergies  Allergen Reactions  . Penicillins Other (See Comments)    unknown  . Tetanus-Diphtheria Toxoids Td Other (See Comments)    unknown  . Latex Hives and Rash    Medications: I have reviewed the patient's current medications.  ROS  Blood pressure 80/50, pulse 82, temperature 97.4 F (36.3 C), temperature source Oral, resp. rate 20, height 5' 1" (1.549 m), weight 104.146 kg (229 lb 9.6 oz), SpO2 100.00%. Physical Exam  GI: Soft.  Morbid obesity, debilitated   Genitourinary:  Vagina with grade 2 cystocele to introitus, and bulge at posterior wall consistent with rectocele. No masses or lesions. Cleaned of gross clots, (300cc) at perineum and inside vaginal entrance. RECTAL EXAM SHOWS GROSS CLOTS AND BLOOD . NO MASSES FELT, RECTOCELE CONFIRMED, no communication to vagina suspected.    Results for orders placed during the hospital encounter of 06/19/13 (from the past 48 hour(s))  GLUCOSE, CAPILLARY     Status: Abnormal   Collection Time    06/21/13 11:43 AM      Result Value Ref Range   Glucose-Capillary 125 (*) 70 - 99 mg/dL   Comment 1 Notify RN    GLUCOSE, CAPILLARY     Status: Abnormal  Collection Time    06/21/13  4:37 PM      Result Value Ref Range   Glucose-Capillary 124 (*) 70 - 99 mg/dL   Comment 1 Notify RN    GLUCOSE, CAPILLARY     Status: None   Collection Time    06/21/13  9:56 PM      Result Value Ref Range   Glucose-Capillary 94  70 - 99 mg/dL   Comment 1 Notify RN     Comment 2 Documented in Chart    PROTIME-INR     Status: Abnormal   Collection Time    06/22/13  4:14 AM      Result Value Ref Range   Prothrombin Time 36.0 (*) 11.6 - 15.2 seconds   INR 3.80 (*) 0.00 - 1.49  CBC     Status: Abnormal   Collection Time    06/22/13  4:14 AM      Result Value Ref Range   WBC 11.3 (*) 4.0 - 10.5 K/uL   RBC 2.58 (*) 3.87 - 5.11 MIL/uL    Hemoglobin 7.7 (*) 12.0 - 15.0 g/dL   HCT 24.6 (*) 36.0 - 46.0 %   MCV 95.3  78.0 - 100.0 fL   MCH 29.8  26.0 - 34.0 pg   MCHC 31.3  30.0 - 36.0 g/dL   RDW 14.0  11.5 - 15.5 %   Platelets 395  150 - 400 K/uL  BASIC METABOLIC PANEL     Status: Abnormal   Collection Time    06/22/13  4:14 AM      Result Value Ref Range   Sodium 127 (*) 137 - 147 mEq/L   Potassium 4.8  3.7 - 5.3 mEq/L   Chloride 92 (*) 96 - 112 mEq/L   CO2 27  19 - 32 mEq/L   Glucose, Bld 82  70 - 99 mg/dL   BUN 45 (*) 6 - 23 mg/dL   Creatinine, Ser 1.34 (*) 0.50 - 1.10 mg/dL   Calcium 7.6 (*) 8.4 - 10.5 mg/dL   GFR calc non Af Amer 37 (*) >90 mL/min   GFR calc Af Amer 43 (*) >90 mL/min   Comment: (NOTE)     The eGFR has been calculated using the CKD EPI equation.     This calculation has not been validated in all clinical situations.     eGFR's persistently <90 mL/min signify possible Chronic Kidney     Disease.  PRO B NATRIURETIC PEPTIDE     Status: Abnormal   Collection Time    06/22/13  6:21 AM      Result Value Ref Range   Pro B Natriuretic peptide (BNP) 6129.0 (*) 0 - 450 pg/mL  GLUCOSE, CAPILLARY     Status: None   Collection Time    06/22/13  7:07 AM      Result Value Ref Range   Glucose-Capillary 83  70 - 99 mg/dL  GLUCOSE, CAPILLARY     Status: None   Collection Time    06/22/13 11:11 AM      Result Value Ref Range   Glucose-Capillary 84  70 - 99 mg/dL   Comment 1 Notify RN    GLUCOSE, CAPILLARY     Status: None   Collection Time    06/22/13  4:20 PM      Result Value Ref Range   Glucose-Capillary 89  70 - 99 mg/dL   Comment 1 Notify RN    GLUCOSE, CAPILLARY  Status: Abnormal   Collection Time    06/22/13  9:40 PM      Result Value Ref Range   Glucose-Capillary 147 (*) 70 - 99 mg/dL   Comment 1 Notify RN     Comment 2 Documented in Chart    PROTIME-INR     Status: Abnormal   Collection Time    06/23/13  1:06 AM      Result Value Ref Range   Prothrombin Time 33.3 (*) 11.6 - 15.2  seconds   INR 3.43 (*) 0.00 - 1.49  CBC     Status: Abnormal   Collection Time    06/23/13  1:06 AM      Result Value Ref Range   WBC 8.8  4.0 - 10.5 K/uL   RBC 2.35 (*) 3.87 - 5.11 MIL/uL   Hemoglobin 7.1 (*) 12.0 - 15.0 g/dL   HCT 22.7 (*) 36.0 - 46.0 %   MCV 96.6  78.0 - 100.0 fL   MCH 30.2  26.0 - 34.0 pg   MCHC 31.3  30.0 - 36.0 g/dL   RDW 14.2  11.5 - 15.5 %   Platelets 367  150 - 400 K/uL  BASIC METABOLIC PANEL     Status: Abnormal   Collection Time    06/23/13  1:06 AM      Result Value Ref Range   Sodium 130 (*) 137 - 147 mEq/L   Potassium 5.1  3.7 - 5.3 mEq/L   Chloride 95 (*) 96 - 112 mEq/L   CO2 25  19 - 32 mEq/L   Glucose, Bld 147 (*) 70 - 99 mg/dL   BUN 36 (*) 6 - 23 mg/dL   Creatinine, Ser 1.11 (*) 0.50 - 1.10 mg/dL   Calcium 7.3 (*) 8.4 - 10.5 mg/dL   GFR calc non Af Amer 47 (*) >90 mL/min   GFR calc Af Amer 54 (*) >90 mL/min   Comment: (NOTE)     The eGFR has been calculated using the CKD EPI equation.     This calculation has not been validated in all clinical situations.     eGFR's persistently <90 mL/min signify possible Chronic Kidney     Disease.  HEMOGLOBIN AND HEMATOCRIT, BLOOD     Status: Abnormal   Collection Time    06/23/13  1:06 AM      Result Value Ref Range   Hemoglobin 7.1 (*) 12.0 - 15.0 g/dL   HCT 22.6 (*) 36.0 - 46.0 %    No results found.  Assessment/Plan: Lower GI bleeding while anticoagulated. No gyn lesions noted.  Will sign off. GI consult , agree with plan.  Jonnie Kind 06/23/2013

## 2013-06-23 NOTE — Consult Note (Signed)
Referring Provider: Dr. Aileen Fass Primary Care Physician:  Osborne Casco, MD Primary Gastroenterologist:  Althia Forts  Reason for Consultation:  Rectal bleeding  HPI: [name] is a 79 y.o. female who was admitted for shortness of breath on 06/19/13 who has a history of anemia of chronic disease and was hospitalized in late April with an INR > 10. She is on Coumadin for Afib. She is being treated with Abx for pneumonia and has had confusion thought to be due to her meds. She was close to discharge when she had the acute onset of red blood per rectum reports as in clots. GYN doctor evaluation showed gross clots and blood in rectum. Blood in vagina thought to be coming from rectum. Rectocele noted by GYN. Screenign colonoscopy in 2006 by Dr. Penelope Coop showed left-sided diverticulosis and internal hemorrhoids. INR 3.4 today. Vitamin K given by Dr. Aileen Fass.     Past Medical History  Diagnosis Date  . HTN (hypertension)   . Diverticulosis   . Hemorrhoid   . Osteoarthritis   . Constipation, chronic   . H/O: hysterectomy   . Systolic and diastolic CHF, chronic     a. Echo 07/2011: Technically limited; inferior HK, inferolateral and possibly lateral wall HK, EF 40%, moderate LVE, aortic sclerosis without stenosis, mild MR, mild LAE, question RV dysfunction;   b.  Echo (04/2013): EF 50-55%, normal wall motion, moderate MR, mild to moderate LAE, PASP 37  . Female bladder prolapse     with prolapse uterus, cystocele, s/p TVH/BSO 12/2006  . Obesity (BMI 30-39.9)     wt. 94.1 kg 12/2010  . Moderate mitral regurgitation     a. Mod to Sev by echo 01/2009;  b. mild MR by echo 2013  . Atrial fibrillation 10/13/2011    coumadin Rx  . Iron deficiency anemia     Past Surgical History  Procedure Laterality Date  . Wrist surgery    . Vaginal hysterectomy,cystocele and prolapse  repair  NOV. 2008  . Abdominal hysterectomy      Prior to Admission medications   Medication Sig Start Date  End Date Taking? Authorizing Provider  Amino Acids-Protein Hydrolys (FEEDING SUPPLEMENT, PRO-STAT SUGAR FREE 64,) LIQD Take 30 mLs by mouth 2 (two) times daily. 8am and 4:30pm   Yes Historical Provider, MD  carvedilol (COREG) 6.25 MG tablet Take 6.25 mg by mouth 2 (two) times daily with a meal.   Yes Historical Provider, MD  docusate sodium (COLACE) 100 MG capsule Take 100 mg by mouth daily.   Yes Historical Provider, MD  ferrous fumarate (FERRETTS) 325 (106 FE) MG TABS Take 1 tablet by mouth 2 (two) times daily. 8am and 4pm   Yes Historical Provider, MD  furosemide (LASIX) 40 MG tablet Take 40 mg by mouth every evening. Daily at 4pm   Yes Historical Provider, MD  furosemide (LASIX) 80 MG tablet Take 80 mg by mouth daily.    Yes Historical Provider, MD  HYDROcodone-acetaminophen (NORCO/VICODIN) 5-325 MG per tablet Take 1 tablet by mouth every 8 (eight) hours as needed (pain).   Yes Historical Provider, MD  lisinopril (PRINIVIL,ZESTRIL) 5 MG tablet Take 5 mg by mouth daily.   Yes Historical Provider, MD  magnesium hydroxide (MILK OF MAGNESIA) 400 MG/5ML suspension Take 30 mLs by mouth daily as needed for mild constipation.   Yes Historical Provider, MD  metolazone (ZAROXOLYN) 5 MG tablet Take 5 mg by mouth daily as needed (for CHF (with morning dose of Lasix)).  12/10/11  Yes Liliane Shi, PA-C  traMADol (ULTRAM) 50 MG tablet Take 50 mg by mouth every 8 (eight) hours as needed (pain).   Yes Historical Provider, MD  ferrous sulfate 325 (65 FE) MG EC tablet Take 1 tablet (325 mg total) by mouth 3 (three) times daily with meals. 06/23/13   Charlynne Cousins, MD  levofloxacin (LEVAQUIN) 750 MG tablet Take 1 tablet (750 mg total) by mouth every other day. 06/22/13   Charlynne Cousins, MD  predniSONE (DELTASONE) 20 MG tablet Take 2 tablets (40 mg total) by mouth daily with breakfast. 06/24/13   Charlynne Cousins, MD  warfarin (COUMADIN) 2 MG tablet Take 2 mg by mouth daily.    Historical Provider, MD     Scheduled Meds: . carvedilol  6.25 mg Oral BID WC  . docusate sodium  100 mg Oral Daily  . levofloxacin  750 mg Oral Q48H  . [START ON 06/24/2013] predniSONE  40 mg Oral Q breakfast   Continuous Infusions:  PRN Meds:.HYDROcodone-acetaminophen, naLOXone (NARCAN)  injection, ondansetron  Allergies as of 06/19/2013 - Review Complete 06/19/2013  Allergen Reaction Noted  . Penicillins Other (See Comments)   . Tetanus-diphtheria toxoids td Other (See Comments)   . Latex Hives and Rash 09/09/2009  . Levaquin [levofloxacin in d5w] Rash 07/06/2012    Family History  Problem Relation Age of Onset  . Diabetes Brother   . Hypertension Mother     deceased @ 60  . Leukemia Brother   . Cancer Brother   . Stroke Mother   . Diabetes Father     deceased in his 36's    History   Social History  . Marital Status: Single    Spouse Name: N/A    Number of Children: N/A  . Years of Education: N/A   Occupational History  . retired Gaffer    Social History Main Topics  . Smoking status: Never Smoker   . Smokeless tobacco: Current User    Types: Snuff     Comment: Uses Snuff regularly.  Dips several x/day.  A "large can" can last her up to 3 mos.  . Alcohol Use: No  . Drug Use: No  . Sexual Activity: Not Currently   Other Topics Concern  . Not on file   Social History Narrative   Lives in [place] by herself.  Her dtr lives near by and prepares all of her meals and leaves them for the pt to heat up in the microwave.  Dtr pays close attn to salt in meals.  Pt is sedentary.  Most activity is moving from couch to bed.  She ambulates w/ a walker.  Wt is up from 217 in January to 244 now.    Review of Systems: All negative from GI standpoint except as stated above in HPI.  Physical Exam: Vital signs: Filed Vitals:   06/23/13 1016  BP: 80/50  Pulse: 82  Temp: 97.4 F (36.3 C)  Resp: 20   Last BM Date: 06/22/13 General:   Lethargic, obese, no acute distress,  elderly Lungs:  Coarse breath sounds Heart:  RRR Abdomen: diffusely tender with guarding, soft, nondistended, obese, +BS  Rectal:  Deferred Ext: tenderness of LE, no edema, dry cracking skin  GI:  Lab Results:  Recent Labs  06/21/13 0500 06/22/13 0414 06/23/13 0106  WBC 14.6* 11.3* 8.8  HGB 8.5* 7.7* 7.1*  7.1*  HCT 26.7* 24.6* 22.7*  22.6*  PLT 391 395 367   BMET  Recent Labs  06/21/13 0953 06/22/13 0414 06/23/13 0106  NA 126* 127* 130*  K 5.1 4.8 5.1  CL 88* 92* 95*  CO2 25 27 25   GLUCOSE 176* 82 147*  BUN 48* 45* 36*  CREATININE 1.29* 1.34* 1.11*  CALCIUM 8.1* 7.6* 7.3*   LFT No results found for this basename: PROT, ALBUMIN, AST, ALT, ALKPHOS, BILITOT, BILIDIR, IBILI,  in the last 72 hours PT/INR  Recent Labs  06/22/13 0414 06/23/13 0106  LABPROT 36.0* 33.3*  INR 3.80* 3.43*     Studies/Results: No results found.  Impression/Plan: 79 yo on Coumadin for Afib with recent PNA, who had altered mental status now with recent episodes of red blood with clots per rectum. Hgb 7.1. Agree with correcting INR. Likely diverticular source. Would recommend conservative management and hold off on a colonoscopy unless bleeding persists after correction of INR. Continue supportive care. Will follow.    LOS: 4 days   Lear Ng  06/23/2013, 1:10 PM

## 2013-06-23 NOTE — Progress Notes (Signed)
Patient AY:[id number] [name]      DOB: November 21, 1934      UXN:[id number]  Asked to hold on Palliative Medicine consult at this time by Dr. Aileen Fass.  Available for reconsult or phone consultation at any time.  Samantha Paino L. Lovena Le, MD MBA The Palliative Medicine Team at [facility] Phone: [phone] Pager: [phone]

## 2013-06-24 DIAGNOSIS — R109 Unspecified abdominal pain: Secondary | ICD-10-CM

## 2013-06-24 DIAGNOSIS — K922 Gastrointestinal hemorrhage, unspecified: Secondary | ICD-10-CM

## 2013-06-24 LAB — CBC WITH DIFFERENTIAL/PLATELET
BASOS ABS: 0 10*3/uL (ref 0.0–0.1)
Basophils Relative: 0 % (ref 0–1)
EOS ABS: 0 10*3/uL (ref 0.0–0.7)
Eosinophils Relative: 0 % (ref 0–5)
HCT: 23 % — ABNORMAL LOW (ref 36.0–46.0)
Hemoglobin: 7.8 g/dL — ABNORMAL LOW (ref 12.0–15.0)
Lymphocytes Relative: 7 % — ABNORMAL LOW (ref 12–46)
Lymphs Abs: 1.2 10*3/uL (ref 0.7–4.0)
MCH: 30.1 pg (ref 26.0–34.0)
MCHC: 33.9 g/dL (ref 30.0–36.0)
MCV: 88.8 fL (ref 78.0–100.0)
Monocytes Absolute: 0.5 10*3/uL (ref 0.1–1.0)
Monocytes Relative: 3 % (ref 3–12)
Neutro Abs: 15.8 10*3/uL — ABNORMAL HIGH (ref 1.7–7.7)
Neutrophils Relative %: 90 % — ABNORMAL HIGH (ref 43–77)
PLATELETS: 265 10*3/uL (ref 150–400)
RBC: 2.59 MIL/uL — ABNORMAL LOW (ref 3.87–5.11)
RDW: 14.6 % (ref 11.5–15.5)
WBC: 17.5 10*3/uL — ABNORMAL HIGH (ref 4.0–10.5)

## 2013-06-24 LAB — BASIC METABOLIC PANEL
BUN: 50 mg/dL — ABNORMAL HIGH (ref 6–23)
CHLORIDE: 92 meq/L — AB (ref 96–112)
CO2: 25 mEq/L (ref 19–32)
Calcium: 7.6 mg/dL — ABNORMAL LOW (ref 8.4–10.5)
Creatinine, Ser: 1.77 mg/dL — ABNORMAL HIGH (ref 0.50–1.10)
GFR calc Af Amer: 31 mL/min — ABNORMAL LOW (ref >90)
GFR calc non Af Amer: 27 mL/min — ABNORMAL LOW (ref >90)
Glucose, Bld: 82 mg/dL (ref 70–99)
POTASSIUM: 5.1 meq/L (ref 3.7–5.3)
Sodium: 128 mEq/L — ABNORMAL LOW (ref 137–147)

## 2013-06-24 LAB — CBC
HCT: 19.8 % — ABNORMAL LOW (ref 36.0–46.0)
Hemoglobin: 6.7 g/dL — CL (ref 12.0–15.0)
MCH: 30.6 pg (ref 26.0–34.0)
MCHC: 33.8 g/dL (ref 30.0–36.0)
MCV: 90.4 fL (ref 78.0–100.0)
PLATELETS: 286 10*3/uL (ref 150–400)
RBC: 2.19 MIL/uL — ABNORMAL LOW (ref 3.87–5.11)
RDW: 15.8 % — ABNORMAL HIGH (ref 11.5–15.5)
WBC: 20.1 10*3/uL — AB (ref 4.0–10.5)

## 2013-06-24 LAB — GLUCOSE, CAPILLARY
GLUCOSE-CAPILLARY: 78 mg/dL (ref 70–99)
GLUCOSE-CAPILLARY: 115 mg/dL — AB (ref 70–99)
GLUCOSE-CAPILLARY: 102 mg/dL — AB (ref 70–99)
Glucose-Capillary: 119 mg/dL — ABNORMAL HIGH (ref 70–99)

## 2013-06-24 LAB — PROTIME-INR
INR: 1.51 — AB (ref 0.00–1.49)
Prothrombin Time: 17.8 seconds — ABNORMAL HIGH (ref 11.6–15.2)

## 2013-06-24 LAB — PREPARE RBC (CROSSMATCH)

## 2013-06-24 MED ORDER — SODIUM CHLORIDE 0.9 % IV SOLN: INTRAVENOUS | Status: AC

## 2013-06-24 MED ORDER — FUROSEMIDE 10 MG/ML IJ SOLN
40.0000 mg | Freq: Once | INTRAMUSCULAR | Status: DC
  Filled 2013-06-24: qty 4

## 2013-06-24 MED ORDER — ONDANSETRON 4 MG PO TBDP
4.0000 mg | ORAL_TABLET | Freq: Three times a day (TID) | ORAL | Status: DC | PRN
  Filled 2013-06-24: qty 1

## 2013-06-24 NOTE — Progress Notes (Addendum)
Patient very lethargic this AM.  Became more alert throughout the day. Received two transfusions of blood.  VSS. Patient complained of pain twice during shift.  Administered PRN pain medication.  Tolerating diet. Stable. No blood in stool except for scant amount while wiping during perineal care for incontinence of urine.

## 2013-06-24 NOTE — Progress Notes (Signed)
[facility] PROGRESS NOTE   Assessment/Plan: Sepsis due HCAP: - Started empirically on Vanc and aztreonam. Blood cultures negative till date. - De-escalate to Levaquin to complete 7 days course. - Afebrile overnight No leukocytosis. - SLP evaluation Minimal risk of aspiration.  Acute encephalopathy: - Now resovled. - Probably due to narcotics. - Was given narcan overnight with improvement in her mental status. She received fentanyl and Vicodin for pain. - D'cd narcotics, NPO, upright position. Ct head negative for bleed she is on coumadin. - Awake start low dose narcotics for pain.  Rash and nonspecific skin eruption: - Unclear etiology. Started on steroids, per ID note she did have biopsy on 11.2015. Was empirically on steroids at that time. Rash was improving. - This rash is not related to levaquin. - Biopsy shows acute inflammation, no viral infection. - Seen by rheumatologist and dermatologist  in 2011 recommended steroids. As per MD note from 11.2011 note there was a suspicion for low-level Stevens-Johnson versus bullous pemphigus. - de-escalate steroids to orals. Will need follow up with rheumatology.  Acute anemia in setting of Anemia of chronic disease:  - Baseline Hgb appears to be 11 (Feb 2015). - Pt passing blood clots which appeared to be from her vagina, GYN consult relates is most rectal bleeding. - Held coumadin, given vit K. INR came down to 1.5. - She does have a history of diverticulosis by CT on 4.26.2015. - Transfuse 1 unit of PRBC. CBC Hbg 6.7 transfuse 2 unit of PRBC check a CBC post transfusional.  Hyponatremia/AKI: - she was on lasix and metolazone. Appears to be dehydrated by physcial exam. - Worsening Cr most likely due to bleed. - start IV fluids.  Thrombocytosis: - unclear etiology ? Reactive. - monitor.  Chronic respiratory failure with hypoxia: - at baseline cont. O2L.  Chronic Afib  - Rate controlled.  - held INR as above.  Hx  of Chronic systolic and diastolic CHF  - TTE 04/7104: EF 50-55%.  - cont home meds  H/o  Pulmonary hypertension: - echo on 2015 showed EF 60 %.    Code Status: FULL  Family Communication: spoke w/ pt and son at bedside  Disposition Plan: inpatinet  Consultants:  none  Procedures:  CT head  CXR  Antibiotics:  vanc aztreonam 5.2.2015  HPI/Subjective: Complaining of pain all over.  feels weak, no further blood clots.  Objective: Filed Vitals:   06/23/13 2059 06/24/13 0400 06/24/13 1044 06/24/13 1121  BP: 92/47 105/43 86/37 96/48   Pulse: 80 71 70 69  Temp: 98.1 F (36.7 C) 97.9 F (36.6 C) 97.8 F (36.6 C) 97.8 F (36.6 C)  TempSrc: Oral Oral Oral Oral  Resp: 20 20 18 20   Height:      Weight:  106 kg (233 lb 11 oz)    SpO2: 100% 100% 100% 100%    Intake/Output Summary (Last 24 hours) at 06/24/13 1149 Last data filed at 06/24/13 0752  Gross per 24 hour  Intake    830 ml  Output      3 ml  Net    827 ml   Filed Weights   06/21/13 0542 06/23/13 0658 06/24/13 0400  Weight: 104.146 kg (229 lb 9.6 oz) 104.146 kg (229 lb 9.6 oz) 106 kg (233 lb 11 oz)    Exam:  General: Alert, awake, oriented x3, in no acute distress.  HEENT: No bruits, no goiter. Skin: macular papular rash. Improving on chest and arms. Heart: Regular rate and rhythm, without murmurs,  rubs, gallops.  Abdomen: Soft, nontender, nondistended, positive bowel sounds.    Data Reviewed: Basic Metabolic Panel:  Recent Labs Lab 06/20/13 0428 06/21/13 0953 06/22/13 0414 06/23/13 0106 06/24/13 0750  NA 125* 126* 127* 130* 128*  K 3.3* 5.1 4.8 5.1 5.1  CL 84* 88* 92* 95* 92*  CO2 30 25 27 25 25   GLUCOSE 102* 176* 82 147* 82  BUN 45* 48* 45* 36* 50*  CREATININE 1.28* 1.29* 1.34* 1.11* 1.77*  CALCIUM 7.5* 8.1* 7.6* 7.3* 7.6*   Liver Function Tests:  Recent Labs Lab 06/19/13 2050  AST 18  ALT 7  ALKPHOS 112  BILITOT 0.3  PROT 6.6  ALBUMIN 2.7*   No results found for this  basename: LIPASE, AMYLASE,  in the last 168 hours No results found for this basename: AMMONIA,  in the last 168 hours CBC:  Recent Labs Lab 06/19/13 2050 06/20/13 0428 06/21/13 0500 06/22/13 0414 06/23/13 0106 06/24/13 0750  WBC 8.0 6.9 14.6* 11.3* 8.8 20.1*  NEUTROABS 6.8 6.1  --   --   --   --   HGB 9.0* 9.0* 8.5* 7.7* 7.1*  7.1* 6.7*  HCT 27.5* 27.9* 26.7* 24.6* 22.7*  22.6* 19.8*  MCV 92.3 93.0 94.0 95.3 96.6 90.4  PLT 433* 430* 391 395 367 286   Cardiac Enzymes:  Recent Labs Lab 06/19/13 2050  CKTOTAL 61  TROPONINI <0.30   BNP (last 3 results)  Recent Labs  07/13/12 1121 06/03/13 1825 06/22/13 0621  PROBNP 106.0* 27115.0* 6129.0*   CBG:  Recent Labs Lab 06/23/13 1203 06/23/13 1650 06/23/13 2143 06/24/13 0654 06/24/13 1115  GLUCAP 123* 137* 121* 78 119*    Recent Results (from the past 240 hour(s))  MRSA PCR SCREENING     Status: None   Collection Time    06/20/13  1:07 AM      Result Value Ref Range Status   MRSA by PCR NEGATIVE  NEGATIVE Final   Comment:            The GeneXpert MRSA Assay (FDA     approved for NASAL specimens     only), is one component of a     comprehensive MRSA colonization     surveillance program. It is not     intended to diagnose MRSA     infection nor to guide or     monitor treatment for     MRSA infections.  CULTURE, BLOOD (ROUTINE X 2)     Status: None   Collection Time    06/20/13  4:20 AM      Result Value Ref Range Status   Specimen Description BLOOD LEFT ARM   Final   Special Requests BOTTLES DRAWN AEROBIC ONLY 5CC   Final   Culture  Setup Time     Final   Value: 06/20/2013 12:01     Performed at [facility]   Culture     Final   Value:        BLOOD CULTURE RECEIVED NO GROWTH TO DATE CULTURE WILL BE HELD FOR 5 DAYS BEFORE ISSUING A FINAL NEGATIVE REPORT     Performed at [facility]   Report Status PENDING   Incomplete  CULTURE, BLOOD (ROUTINE X 2)     Status: None   Collection Time     06/20/13  4:28 AM      Result Value Ref Range Status   Specimen Description BLOOD LEFT HAND   Final   Special  Requests BOTTLES DRAWN AEROBIC AND ANAEROBIC 10CC EACH   Final   Culture  Setup Time     Final   Value: 06/20/2013 12:01     Performed at [facility]   Culture     Final   Value:        BLOOD CULTURE RECEIVED NO GROWTH TO DATE CULTURE WILL BE HELD FOR 5 DAYS BEFORE ISSUING A FINAL NEGATIVE REPORT     Performed at [facility]   Report Status PENDING   Incomplete     Studies: No results found.  Scheduled Meds: . carvedilol  6.25 mg Oral BID WC  . docusate sodium  100 mg Oral Daily  . furosemide  40 mg Intravenous Once  . levofloxacin  750 mg Oral Q48H  . predniSONE  40 mg Oral Q breakfast   Continuous Infusions:     [facility] Pager [phone]. If 8PM-8AM, please contact night-coverage at [email or web address], password [facility] 06/24/2013, 11:49 AM  LOS: 5 days

## 2013-06-24 NOTE — Progress Notes (Signed)
CRITICAL VALUE ALERT  Critical value received:  hgb 6.7   Date of notification:  06/24/13  Time of notification:  0845  Critical value read back: yes  Nurse who received alert: Enos Fling, RN  MD notified (1st page): Dr. Venetia Constable MD on the unit   Time of first page:  53  MD notified (2nd page):  Time of second page:  Responding MD:  Dr Venetia Constable   Time MD responded:  [phone]

## 2013-06-24 NOTE — Progress Notes (Signed)
Patient ID: [name], female   DOB: 1934-11-28, 79 y.o.   MRN: [id number] [facility] Progress Note  BRENDY FICEK 79 y.o. Nov 18, 1934   Subjective: Small amount of red blood with clots without stool on diaper pad today per nurse. Patient complaining of pain all over.  Objective: Vital signs in last 24 hours: Filed Vitals:   06/24/13 1221  BP: 95/39  Pulse: 78  Temp: 98.2 F (36.8 C)  Resp: 18    Physical Exam: Gen: lethargic, elderly, obese, no acute distress Abd: diffusely tender with guarding, nondistended, obese, +BS  Lab Results:  Recent Labs  06/23/13 0106 06/24/13 0750  NA 130* 128*  K 5.1 5.1  CL 95* 92*  CO2 25 25  GLUCOSE 147* 82  BUN 36* 50*  CREATININE 1.11* 1.77*  CALCIUM 7.3* 7.6*   No results found for this basename: AST, ALT, ALKPHOS, BILITOT, PROT, ALBUMIN,  in the last 72 hours  Recent Labs  06/23/13 0106 06/24/13 0750  WBC 8.8 20.1*  HGB 7.1*  7.1* 6.7*  HCT 22.7*  22.6* 19.8*  MCV 96.6 90.4  PLT 367 286    Recent Labs  06/23/13 0106 06/24/13 0750  LABPROT 33.3* 17.8*  INR 3.43* 1.51*      Assessment/Plan: GI bleed - resolving. S/P correction of INR. Hgb 6.7 (7.1). Agree with transfusion. Suspect diverticular source. Continue supportive care. Hold off on colonoscopy at this time. My partner will see her tomorrow.   Lear Ng 06/24/2013, 12:49 PM

## 2013-06-25 LAB — BASIC METABOLIC PANEL
BUN: 47 mg/dL — AB (ref 6–23)
CO2: 25 mEq/L (ref 19–32)
Calcium: 7.4 mg/dL — ABNORMAL LOW (ref 8.4–10.5)
Chloride: 91 mEq/L — ABNORMAL LOW (ref 96–112)
Creatinine, Ser: 1.45 mg/dL — ABNORMAL HIGH (ref 0.50–1.10)
GFR calc Af Amer: 39 mL/min — ABNORMAL LOW (ref >90)
GFR, EST NON AFRICAN AMERICAN: 34 mL/min — AB (ref >90)
Glucose, Bld: 91 mg/dL (ref 70–99)
Potassium: 5.1 mEq/L (ref 3.7–5.3)
Sodium: 126 mEq/L — ABNORMAL LOW (ref 137–147)

## 2013-06-25 LAB — TYPE AND SCREEN
ABO/RH(D): O POS
Antibody Screen: NEGATIVE
UNIT DIVISION: 0
Unit division: 0
Unit division: 0

## 2013-06-25 LAB — GLUCOSE, CAPILLARY
GLUCOSE-CAPILLARY: 119 mg/dL — AB (ref 70–99)
Glucose-Capillary: 91 mg/dL (ref 70–99)
Glucose-Capillary: 115 mg/dL — ABNORMAL HIGH (ref 70–99)
Glucose-Capillary: 176 mg/dL — ABNORMAL HIGH (ref 70–99)

## 2013-06-25 LAB — CBC
HEMATOCRIT: 22.9 % — AB (ref 36.0–46.0)
Hemoglobin: 7.7 g/dL — ABNORMAL LOW (ref 12.0–15.0)
MCH: 30.2 pg (ref 26.0–34.0)
MCHC: 33.6 g/dL (ref 30.0–36.0)
MCV: 89.8 fL (ref 78.0–100.0)
Platelets: 283 10*3/uL (ref 150–400)
RBC: 2.55 MIL/uL — ABNORMAL LOW (ref 3.87–5.11)
RDW: 14.9 % (ref 11.5–15.5)
WBC: 17.9 10*3/uL — ABNORMAL HIGH (ref 4.0–10.5)

## 2013-06-25 LAB — PROTIME-INR
INR: 1.32 (ref 0.00–1.49)
Prothrombin Time: 16.1 seconds — ABNORMAL HIGH (ref 11.6–15.2)

## 2013-06-25 MED ORDER — SODIUM CHLORIDE 0.9 % IV SOLN
INTRAVENOUS | Status: AC
  Administered 2013-06-25 (×2): 1000 mL via INTRAVENOUS

## 2013-06-25 MED ORDER — BIOTENE DRY MOUTH MT LIQD
15.0000 mL | Freq: Two times a day (BID) | OROMUCOSAL | Status: DC
  Administered 2013-06-25 – 2013-06-26 (×3): 15 mL via OROMUCOSAL

## 2013-06-25 MED ORDER — SODIUM CHLORIDE 0.9 % IV BOLUS (SEPSIS)
500.0000 mL | Freq: Once | INTRAVENOUS | Status: AC
  Administered 2013-06-25: 500 mL via INTRAVENOUS

## 2013-06-25 NOTE — Progress Notes (Signed)
[facility] PROGRESS NOTE   Assessment/Plan: Sepsis due HCAP: - Started empirically on Vanc and aztreonam. Blood cultures negative till date. - De-escalate to Levaquin to complete 7 days course. - Afebrile overnight No leukocytosis. - SLP evaluation Minimal risk of aspiration.  Acute encephalopathy: - Now resovled. - Probably due to narcotics. - Was given narcan overnight with improvement in her mental status. She received fentanyl and Vicodin for pain. - D'cd narcotics, NPO, upright position. Ct head negative for bleed she is on coumadin. - Awake start low dose narcotics for pain.  Rash and nonspecific skin eruption: - Unclear etiology. Started on steroids, per ID note she did have biopsy on 11.2015. Was empirically on steroids at that time. Rash was improving. - This rash is not related to levaquin. - Biopsy shows acute inflammation, no viral infection. - Seen by rheumatologist and dermatologist  in 2011 recommended steroids. As per MD note from 11.2011 note there was a suspicion for low-level Stevens-Johnson versus bullous pemphigus. - de-escalate steroids to orals. Will need follow up with rheumatology.  Acute anemia in setting of Anemia of chronic disease:  - Baseline Hgb appears to be 11 (Feb 2015). - Pt passing blood clots ,  most rectal bleeding. - Held coumadin, given vit K. INR came down to 1.5. - She does have a history of diverticulosis by CT on 4.26.2015. - Transfuse 1 unit of PRBC. CBC Hbg 6.7 transfuse 2 unit of PRBC check a CBC post transfusional. - consulted GI recommended conservative management. Resume coumadin in 2 weeks. ? ISchemic colitis.  Hyponatremia/AKI: - she was on lasix and metolazone. Appears to be dehydrated by physcial exam. - Worsening Cr most likely due to bleed. - start IV fluids.  Thrombocytosis: - unclear etiology ? Reactive. - monitor.  Chronic respiratory failure with hypoxia: - at baseline cont. O2L.  Chronic Afib  -  Rate controlled.  - held INR as above.  Hx of Chronic systolic and diastolic CHF  - TTE 09/1189: EF 50-55%.  - cont home meds  H/o  Pulmonary hypertension: - echo on 2015 showed EF 60 %.    Code Status: FULL  Family Communication: spoke w/ pt and son at bedside  Disposition Plan: inpatinet  Consultants:  none  Procedures:  CT head  CXR  Antibiotics:  vanc aztreonam 5.2.2015  HPI/Subjective: Abdominal pain improved.   Objective: Filed Vitals:   06/24/13 1627 06/24/13 1727 06/24/13 2055 06/25/13 0459  BP: 95/37 103/42 107/42 107/45  Pulse: 77 80 71 73  Temp: 98.1 F (36.7 C) 98.8 F (37.1 C) 97.2 F (36.2 C) 98.8 F (37.1 C)  TempSrc: Oral Oral Oral Oral  Resp: 20 18 18 20   Height:      Weight:    105.416 kg (232 lb 6.4 oz)  SpO2: 98% 100% 100% 98%    Intake/Output Summary (Last 24 hours) at 06/25/13 0955 Last data filed at 06/24/13 1853  Gross per 24 hour  Intake   1042 ml  Output      0 ml  Net   1042 ml   Filed Weights   06/23/13 0658 06/24/13 0400 06/25/13 0459  Weight: 104.146 kg (229 lb 9.6 oz) 106 kg (233 lb 11 oz) 105.416 kg (232 lb 6.4 oz)    Exam:  General: Alert, awake, oriented x3, in no acute distress.  HEENT: No bruits, no goiter. Skin: macular papular rash. Improving on chest and arms. Heart: Regular rate and rhythm, without murmurs, rubs, gallops.  Abdomen: Soft,  nontender, nondistended, positive bowel sounds.    Data Reviewed: Basic Metabolic Panel:  Recent Labs Lab 06/21/13 0953 06/22/13 0414 06/23/13 0106 06/24/13 0750 06/25/13 0422  NA 126* 127* 130* 128* 126*  K 5.1 4.8 5.1 5.1 5.1  CL 88* 92* 95* 92* 91*  CO2 25 27 25 25 25   GLUCOSE 176* 82 147* 82 91  BUN 48* 45* 36* 50* 47*  CREATININE 1.29* 1.34* 1.11* 1.77* 1.45*  CALCIUM 8.1* 7.6* 7.3* 7.6* 7.4*   Liver Function Tests:  Recent Labs Lab 06/19/13 2050  AST 18  ALT 7  ALKPHOS 112  BILITOT 0.3  PROT 6.6  ALBUMIN 2.7*   No results found for this  basename: LIPASE, AMYLASE,  in the last 168 hours No results found for this basename: AMMONIA,  in the last 168 hours CBC:  Recent Labs Lab 06/19/13 2050 06/20/13 0428  06/22/13 0414 06/23/13 0106 06/24/13 0750 06/24/13 2026 06/25/13 0422  WBC 8.0 6.9  < > 11.3* 8.8 20.1* 17.5* 17.9*  NEUTROABS 6.8 6.1  --   --   --   --  15.8*  --   HGB 9.0* 9.0*  < > 7.7* 7.1*  7.1* 6.7* 7.8* 7.7*  HCT 27.5* 27.9*  < > 24.6* 22.7*  22.6* 19.8* 23.0* 22.9*  MCV 92.3 93.0  < > 95.3 96.6 90.4 88.8 89.8  PLT 433* 430*  < > 395 367 286 265 283  < > = values in this interval not displayed. Cardiac Enzymes:  Recent Labs Lab 06/19/13 2050  CKTOTAL 61  TROPONINI <0.30   BNP (last 3 results)  Recent Labs  07/13/12 1121 06/03/13 1825 06/22/13 0621  PROBNP 106.0* 27115.0* 6129.0*   CBG:  Recent Labs Lab 06/24/13 0654 06/24/13 1115 06/24/13 1650 06/24/13 2236 06/25/13 0637  GLUCAP 78 119* 115* 102* 91    Recent Results (from the past 240 hour(s))  MRSA PCR SCREENING     Status: None   Collection Time    06/20/13  1:07 AM      Result Value Ref Range Status   MRSA by PCR NEGATIVE  NEGATIVE Final   Comment:            The GeneXpert MRSA Assay (FDA     approved for NASAL specimens     only), is one component of a     comprehensive MRSA colonization     surveillance program. It is not     intended to diagnose MRSA     infection nor to guide or     monitor treatment for     MRSA infections.  CULTURE, BLOOD (ROUTINE X 2)     Status: None   Collection Time    06/20/13  4:20 AM      Result Value Ref Range Status   Specimen Description BLOOD LEFT ARM   Final   Special Requests BOTTLES DRAWN AEROBIC ONLY 5CC   Final   Culture  Setup Time     Final   Value: 06/20/2013 12:01     Performed at [facility]   Culture     Final   Value:        BLOOD CULTURE RECEIVED NO GROWTH TO DATE CULTURE WILL BE HELD FOR 5 DAYS BEFORE ISSUING A FINAL NEGATIVE REPORT     Performed at  [facility]   Report Status PENDING   Incomplete  CULTURE, BLOOD (ROUTINE X 2)     Status: None   Collection Time  06/20/13  4:28 AM      Result Value Ref Range Status   Specimen Description BLOOD LEFT HAND   Final   Special Requests BOTTLES DRAWN AEROBIC AND ANAEROBIC 10CC EACH   Final   Culture  Setup Time     Final   Value: 06/20/2013 12:01     Performed at [facility]   Culture     Final   Value:        BLOOD CULTURE RECEIVED NO GROWTH TO DATE CULTURE WILL BE HELD FOR 5 DAYS BEFORE ISSUING A FINAL NEGATIVE REPORT     Performed at [facility]   Report Status PENDING   Incomplete     Studies: No results found.  Scheduled Meds: . antiseptic oral rinse  15 mL Mouth Rinse BID  . carvedilol  6.25 mg Oral BID WC  . docusate sodium  100 mg Oral Daily  . levofloxacin  750 mg Oral Q48H  . predniSONE  40 mg Oral Q breakfast  . sodium chloride  500 mL Intravenous Once   Continuous Infusions: . sodium chloride       [facility] Pager [phone]. If 8PM-8AM, please contact night-coverage at [email or web address], password [facility] 06/25/2013, 9:55 AM  LOS: 6 days

## 2013-06-25 NOTE — Progress Notes (Signed)
Patient unaware of any GI bleeding. Spoke with the patient's nurse, who corroborates the absence of hematochezia overnight or this morning.  The patient does complain of diffuse pain across the entire upper two thirds of her abdomen, with a broad sweeping gesture. However, she only mentioned this when I asked her she was having discomfort, and she did not appear to be in any distress.  Exam: Patient in no distress. Abdomen severely obese, soft bowel sounds, moderate upper abdominal tympany without distention, no guarding, no mass effect, no evident tenderness to palpation.  Note that white count is elevated over the past 2 days  Hemoglobin has remained stable post yesterday's transfusion, currently 7.7.   INR has drifted down to a current level of 1.3.  Impression:  1. No evidence of ongoing GI bleeding.   2. Posthemorrhagic anemia, stable post transfusion.  3. Leukocytosis, diffuse abdominal pain, and recent hematochezia while anticoagulated. This raises the question of possible ischemic colitis.  Recommendation:  1. Given the absence of a clear cause for the patient's hematochezia and posthemorrhagic anemia, I would favor avoidance of resumption of anticoagulation for at least a week or 2, unless the patient is felt to be in critical need of it. On the other hand, I think DVT prophylaxis with heparin or Lovenox is okay starting anytime.  2. I am not sufficiently impressed with the patient's abdominal pain, nor certainly her exam, to feel that a repeat CT scan (last done about 3 weeks ago) is needed. Even if this is ischemic colitis, that is typically a self-limited condition that does not require specific alteration of therapy. However, if her white count does not improve and/or if her abdominal pain persists, I would then give consideration to a repeat CT scan.  3. If the patient is without evidence of further overt bleeding, I would favor outpatient followup with her primary  gastroenterologist, Dr. Acquanetta Sit, after discharge, for consideration of possible updated colonoscopic evaluation, particularly if she should remain Hemoccult positive as an outpatient, since it has been almost 10 years since her last colonoscopy. I will notify his office to followup with patient to see if this is clinically appropriate, based on her clinical evolution.  4. I will sign off at this time, but please call if we can be of further assistance in this patient's care.  Cleotis Nipper, M.D. [phone]

## 2013-06-25 NOTE — Progress Notes (Signed)
Pt requested to be turned and dry after family leaves.  Assited on coming nurse to pull pt and dry pt. up.  Refused to be turned when offered.  Wanted to stay on her back.  Will continue to monitor.  Karie Kirks, Therapist, sports.

## 2013-06-25 NOTE — Progress Notes (Signed)
Patient discussed with Dr. Aileen Fass. No change in d/c plan. Will hopefully be stable for d/c tomorrow back to [facility]. Will re-evaluate in the morning. Will notify Suanne Marker, Admissions at [facility] re: above.  Lorie Phenix. [place], [place]

## 2013-06-26 ENCOUNTER — Other Ambulatory Visit: Payer: Self-pay | Admitting: *Deleted

## 2013-06-26 LAB — CBC
HCT: 23.1 % — ABNORMAL LOW (ref 36.0–46.0)
Hemoglobin: 7.7 g/dL — ABNORMAL LOW (ref 12.0–15.0)
MCH: 30.7 pg (ref 26.0–34.0)
MCHC: 33.3 g/dL (ref 30.0–36.0)
MCV: 92 fL (ref 78.0–100.0)
Platelets: 290 10*3/uL (ref 150–400)
RBC: 2.51 MIL/uL — ABNORMAL LOW (ref 3.87–5.11)
RDW: 14.8 % (ref 11.5–15.5)
WBC: 15.2 10*3/uL — AB (ref 4.0–10.5)

## 2013-06-26 LAB — GLUCOSE, CAPILLARY
Glucose-Capillary: 62 mg/dL — ABNORMAL LOW (ref 70–99)
Glucose-Capillary: 85 mg/dL (ref 70–99)
Glucose-Capillary: 111 mg/dL — ABNORMAL HIGH (ref 70–99)

## 2013-06-26 LAB — BASIC METABOLIC PANEL
BUN: 32 mg/dL — AB (ref 6–23)
CHLORIDE: 103 meq/L (ref 96–112)
CO2: 27 mEq/L (ref 19–32)
Calcium: 7.5 mg/dL — ABNORMAL LOW (ref 8.4–10.5)
Creatinine, Ser: 1.13 mg/dL — ABNORMAL HIGH (ref 0.50–1.10)
GFR calc non Af Amer: 46 mL/min — ABNORMAL LOW (ref >90)
GFR, EST AFRICAN AMERICAN: 53 mL/min — AB (ref >90)
Glucose, Bld: 75 mg/dL (ref 70–99)
POTASSIUM: 5.1 meq/L (ref 3.7–5.3)
Sodium: 136 mEq/L — ABNORMAL LOW (ref 137–147)

## 2013-06-26 LAB — CULTURE, BLOOD (ROUTINE X 2)
Culture: NO GROWTH
Culture: NO GROWTH

## 2013-06-26 LAB — PROTIME-INR
INR: 1.37 (ref 0.00–1.49)
Prothrombin Time: 16.5 seconds — ABNORMAL HIGH (ref 11.6–15.2)

## 2013-06-26 MED ORDER — PANTOPRAZOLE SODIUM 40 MG PO TBEC
40.0000 mg | DELAYED_RELEASE_TABLET | Freq: Two times a day (BID) | ORAL | Status: DC
  Administered 2013-06-26: 40 mg via ORAL
  Filled 2013-06-26: qty 1

## 2013-06-26 MED ORDER — HYDROCODONE-ACETAMINOPHEN 5-325 MG PO TABS: ORAL_TABLET | ORAL | Status: DC

## 2013-06-26 MED ORDER — TRAMADOL HCL 50 MG PO TABS: ORAL_TABLET | ORAL | Status: DC

## 2013-06-26 MED ORDER — PANTOPRAZOLE SODIUM 40 MG PO TBEC: 40.0000 mg | DELAYED_RELEASE_TABLET | Freq: Two times a day (BID) | ORAL | Status: DC

## 2013-06-26 NOTE — Progress Notes (Signed)
Attempted to call report to [facility] home no response x2.  Phone no. Verified with Marliss Czar, she stated no was correct.  Karie Kirks, Therapist, sports.

## 2013-06-26 NOTE — Progress Notes (Signed)
PT. With CBG of 62 this am. Carbs given. Oncoming RN made aware. feliz-Ortiz,  MD notified via text page. Gillis Santa Devine Klingel

## 2013-06-26 NOTE — Telephone Encounter (Signed)
[facility] 

## 2013-06-26 NOTE — Progress Notes (Signed)
Pt d/c to [facility] via ambulance.  At 1445.  Karie Kirks, Therapist, sports.

## 2013-06-26 NOTE — Progress Notes (Signed)
Order clarification for pt to be d/c to NH home not to home as informed by Dr. Lovell Sheehan.  Karie Kirks, Therapist, sports.

## 2013-06-28 ENCOUNTER — Non-Acute Institutional Stay (SKILLED_NURSING_FACILITY): Payer: Medicare Other | Admitting: Internal Medicine

## 2013-06-28 ENCOUNTER — Encounter: Payer: Self-pay | Admitting: Internal Medicine

## 2013-06-28 DIAGNOSIS — I4891 Unspecified atrial fibrillation: Secondary | ICD-10-CM

## 2013-06-28 DIAGNOSIS — D509 Iron deficiency anemia, unspecified: Secondary | ICD-10-CM

## 2013-06-28 DIAGNOSIS — I509 Heart failure, unspecified: Secondary | ICD-10-CM

## 2013-06-28 DIAGNOSIS — G9340 Encephalopathy, unspecified: Secondary | ICD-10-CM

## 2013-06-28 DIAGNOSIS — K922 Gastrointestinal hemorrhage, unspecified: Secondary | ICD-10-CM

## 2013-06-28 DIAGNOSIS — R21 Rash and other nonspecific skin eruption: Secondary | ICD-10-CM

## 2013-06-28 DIAGNOSIS — I5042 Chronic combined systolic (congestive) and diastolic (congestive) heart failure: Secondary | ICD-10-CM

## 2013-06-28 DIAGNOSIS — N179 Acute kidney failure, unspecified: Secondary | ICD-10-CM

## 2013-06-28 DIAGNOSIS — J189 Pneumonia, unspecified organism: Secondary | ICD-10-CM

## 2013-06-29 ENCOUNTER — Encounter: Payer: Self-pay | Admitting: Internal Medicine

## 2013-06-29 DIAGNOSIS — N179 Acute kidney failure, unspecified: Secondary | ICD-10-CM | POA: Insufficient documentation

## 2013-06-29 NOTE — Assessment & Plan Note (Signed)
Now resovled.  - Probably due to narcotics.  - Was given narcan overnight with improvement in her mental status. She received fentanyl and Vicodin for pain.  - D'cd narcotics. Ct head negative for bleed she is on coumadin.  - Awake start low dose narcotics for pain

## 2013-06-29 NOTE — Assessment & Plan Note (Signed)
TTE 04/2013: EF 50-55%.  - cont home meds - coreg, lisinopril and lasix

## 2013-06-29 NOTE — Assessment & Plan Note (Deleted)
-   Baseline Hgb appears to be 11 (Feb 2015).  - Pt passing blood clots , most rectal bleeding.  - Held coumadin, given vit K. INR came down to 1.5.  - She does have a history of diverticulosis by CT on 4.26.2015.  - Transfuse 1 unit of PRBC. Repeated CBC Hbg 6.7then transfuse 2 unit of PRBC check a CBC post transfusional.  - consulted GI recommended conservative management. Resume coumadin in 2 weeks. ? ISchemic colitis.

## 2013-06-29 NOTE — Assessment & Plan Note (Signed)
-   Unclear etiology. Started on steroids, per ID note she did have biopsy on 11.2015. Was empirically on steroids at that time. Rash was improving.  - Biopsy shows acute inflammation, no viral infection.  - Seen by rheumatologist and dermatologist in 2011 recommended steroids. As per MD note from 11.2011 note there was a suspicion for low-level Stevens-Johnson versus bullous pemphigus.  - de-escalate steroids to orals. Will need follow up with rheumatology

## 2013-06-29 NOTE — Assessment & Plan Note (Signed)
Rate controlled with coreg

## 2013-06-29 NOTE — Assessment & Plan Note (Signed)
Prob 2/2 GI bleed

## 2013-06-29 NOTE — Assessment & Plan Note (Signed)
Sepsis due HCAP:  - Started empirically on Vanc and aztreonam. Blood cultures negative till date.  - De-escalate to Levaquin to complete 7 days course.  - Afebrile overnight No leukocytosis.  - SLP evaluation Minimal risk of aspiration

## 2013-06-29 NOTE — Assessment & Plan Note (Signed)
-   Baseline Hgb appears to be 11 (Feb 2015).  - Pt passing blood clots , most rectal bleeding.  - Held coumadin, given vit K. INR came down to 1.5.  - She does have a history of diverticulosis by CT on 4.26.2015.  - Transfuse 1 unit of PRBC. Repeated CBC Hbg 6.7then transfuse 2 unit of PRBC check a CBC post transfusional.  - consulted GI recommended conservative management. Resume coumadin in 2 weeks. ? ISchemic colitis.     

## 2013-06-29 NOTE — Progress Notes (Signed)
MRN: [id number] Name: [name]  Sex: female Age: 79 y.o. DOB: 1935-01-16  [place] #: [name] Facility/Room: 219 Level Of Care: SNF Provider: Hennie Duos Emergency Contacts: Extended Emergency Contact Information Primary Emergency Contact: Hadaway,Reggie Address: [place], [place] [address] [name] of [place] Phone: [phone] Work Phone: [phone] Relation: Son Secondary Emergency Contact: Ruiter,[name]          [place], [place] Phone: [phone] Relation: Daughter  Code Status:   Allergies: Penicillins; Tetanus-diphtheria toxoids td; and Latex  Chief Complaint  Patient presents with  . nursing home admission    HPI: Patient is 79 y.o. female who is admitted to SNF after being hospitalized for PNA.  Past Medical History  Diagnosis Date  . HTN (hypertension)   . Diverticulosis   . Hemorrhoid   . Osteoarthritis   . Constipation, chronic   . H/O: hysterectomy   . Systolic and diastolic CHF, chronic     a. Echo 07/2011: Technically limited; inferior HK, inferolateral and possibly lateral wall HK, EF 40%, moderate LVE, aortic sclerosis without stenosis, mild MR, mild LAE, question RV dysfunction;   b.  Echo (04/2013): EF 50-55%, normal wall motion, moderate MR, mild to moderate LAE, PASP 37  . Female bladder prolapse     with prolapse uterus, cystocele, s/p TVH/BSO 12/2006  . Obesity (BMI 30-39.9)     wt. 94.1 kg 12/2010  . Moderate mitral regurgitation     a. Mod to Sev by echo 01/2009;  b. mild MR by echo 2013  . Atrial fibrillation 10/13/2011    coumadin Rx  . Iron deficiency anemia     Past Surgical History  Procedure Laterality Date  . Wrist surgery    . Vaginal hysterectomy,cystocele and prolapse  repair  NOV. 2008  . Abdominal hysterectomy        Medication List       This list is accurate as of: 06/28/13 11:59 PM.  Always use your most recent med list.               carvedilol  6.25 MG tablet  Commonly known as:  COREG  Take 6.25 mg by mouth 2 (two) times daily with a meal.     docusate sodium 100 MG capsule  Commonly known as:  COLACE  Take 100 mg by mouth daily.     feeding supplement (PRO-STAT SUGAR FREE 64) Liqd  Take 30 mLs by mouth 2 (two) times daily. 8am and 4:30pm     FERRETTS 325 (106 FE) MG Tabs tablet  Generic drug:  ferrous fumarate  Take 1 tablet by mouth 2 (two) times daily. 8am and 4pm     ferrous sulfate 325 (65 FE) MG EC tablet  Take 1 tablet (325 mg total) by mouth 3 (three) times daily with meals.     furosemide 80 MG tablet  Commonly known as:  LASIX  Take 80 mg by mouth daily.     furosemide 40 MG tablet  Commonly known as:  LASIX  Take 40 mg by mouth every evening. Daily at 4pm     HYDROcodone-acetaminophen 5-325 MG per tablet  Commonly known as:  NORCO/VICODIN  Take one tablet by mouth every 8 hours as needed for pain     levofloxacin 750 MG tablet  Commonly known as:  LEVAQUIN  Take 1 tablet (750 mg total) by mouth every other day.  lisinopril 5 MG tablet  Commonly known as:  PRINIVIL,ZESTRIL  Take 5 mg by mouth daily.     magnesium hydroxide 400 MG/5ML suspension  Commonly known as:  MILK OF MAGNESIA  Take 30 mLs by mouth daily as needed for mild constipation.     pantoprazole 40 MG tablet  Commonly known as:  PROTONIX  Take 1 tablet (40 mg total) by mouth 2 (two) times daily.     predniSONE 20 MG tablet  Commonly known as:  DELTASONE  Take 2 tablets (40 mg total) by mouth daily with breakfast.     traMADol 50 MG tablet  Commonly known as:  ULTRAM  Take one tablet by mouth every 8 hours as needed for pain        No orders of the defined types were placed in this encounter.     There is no immunization history on file for this patient.  History  Substance Use Topics  . Smoking status: Never Smoker   . Smokeless tobacco: Current User    Types: Snuff     Comment: Uses Snuff regularly.  Dips  several x/day.  A "large can" can last her up to 3 mos.  . Alcohol Use: No    Family history is noncontributory    Review of Systems  DATA OBTAINED: from patient; no c/o GENERAL: Feels well no fevers, fatigue, appetite changes SKIN: No itching, rash or wounds EYES: No eye pain, redness, discharge EARS: No earache, tinnitus, change in hearing NOSE: No congestion, drainage or bleeding  MOUTH/THROAT: No mouth or tooth pain, No sore throat RESPIRATORY: No cough, wheezing, SOB CARDIAC: No chest pain, palpitations, lower extremity edema  GI: No abdominal pain, No N/V/D or constipation, No heartburn or reflux  GU: No dysuria, frequency or urgency, or incontinence  MUSCULOSKELETAL: No unrelieved bone/joint pain NEUROLOGIC: No headache, dizziness or focal weakness PSYCHIATRIC: No overt anxiety or sadness. Sleeps well. No behavior issue.   Filed Vitals:   06/28/13 1912  BP: 91/55  Pulse: 67  Temp: 98.7 F (37.1 C)  Resp: 19    Physical Exam  GENERAL APPEARANCE: Alert,min  conversant. Appropriately groomed. No acute distress.  SKIN: No diaphoresis rash,  HEAD: Normocephalic, atraumatic  EYES: Conjunctiva/lids clear. Pupils round, reactive. EOMs intact.  EARS: External exam WNL, canals clear. Hearing grossly normal.  NOSE: No deformity or discharge.  MOUTH/THROAT: Lips w/o lesions.  RESPIRATORY: Breathing is even, unlabored. Lung sounds are clear   CARDIOVASCULAR: Heart RRR 2/6 systolic murmur, rubs or gallops. No peripheral edema.   GASTROINTESTINAL: Abdomen is soft, non-tender, not distended w/ normal bowel sounds GENITOURINARY: Bladder non tender, not distended  MUSCULOSKELETAL: No abnormal joints or musculature NEUROLOGIC:  Cranial nerves 2-12 grossly intact. Moves all extremities no tremor. PSYCHIATRIC: Mood and affect appropriate to situation, no behavioral issues  Patient Active Problem List   Diagnosis Date Noted  . AKI (acute kidney injury) 06/29/2013  . Acute lower  GI bleeding 06/23/2013  . PNA (pneumonia) 06/19/2013  . Rash and nonspecific skin eruption 06/19/2013  . Chronic respiratory failure with hypoxia 06/18/2013  . Acute encephalopathy 06/07/2013  . Diastolic CHF 54/62/7035  . Pulmonary hypertension 06/05/2013  . Weakness 06/03/2013  . Encounter for therapeutic drug monitoring 04/10/2013  . Long term current use of anticoagulant 10/26/2011  . Hypokalemia 10/15/2011  . Abdominal pain 10/15/2011  . Iron deficiency anemia 10/15/2011  . Bladder prolapse, female, acquired 10/15/2011  . Atrial fibrillation 10/15/2011  . Acute on chronic combined systolic and  diastolic congestive heart failure 07/22/2011  . Facial edema 12/30/2010  . Leukopenia 12/30/2010  . Neutropenia 12/30/2010  . Abdominal pain, unspecified site 12/27/2010  . Chronic pain 12/25/2010  . Hematochezia 12/25/2010  . Dehydration 12/25/2010  . Thrombocytosis 12/25/2010  . Hyponatremia 12/25/2010  . Systolic and diastolic CHF, chronic   . Female bladder prolapse   . Mitral regurgitation 06/04/2010  . HYPOPOTASSEMIA 09/24/2009  . COMBINED HEART FAILURE, CHRONIC 09/09/2009  . CARDIOMYOPATHY 03/04/2009  . ANASARCA 03/04/2009  . HYPERTENSION 02/20/2009  . CONSTIPATION, CHRONIC 02/20/2009  . DIVERTICULOSIS 02/20/2009    CBC    Component Value Date/Time   WBC 15.2* 06/26/2013 0445   RBC 2.51* 06/26/2013 0445   RBC 1.95* 06/23/2013 1118   HGB 7.7* 06/26/2013 0445   HCT 23.1* 06/26/2013 0445   PLT 290 06/26/2013 0445   MCV 92.0 06/26/2013 0445   LYMPHSABS 1.2 06/24/2013 2026   MONOABS 0.5 06/24/2013 2026   EOSABS 0.0 06/24/2013 2026   BASOSABS 0.0 06/24/2013 2026    CMP     Component Value Date/Time   NA 136* 06/26/2013 0445   K 5.1 06/26/2013 0445   CL 103 06/26/2013 0445   CO2 27 06/26/2013 0445   GLUCOSE 75 06/26/2013 0445   BUN 32* 06/26/2013 0445   CREATININE 1.13* 06/26/2013 0445   CALCIUM 7.5* 06/26/2013 0445   PROT 6.6 06/19/2013 2050   ALBUMIN 2.7* 06/19/2013 2050    AST 18 06/19/2013 2050   ALT 7 06/19/2013 2050   ALKPHOS 112 06/19/2013 2050   BILITOT 0.3 06/19/2013 2050   GFRNONAA 46* 06/26/2013 0445   GFRAA 53* 06/26/2013 0445    Assessment and Plan  PNA (pneumonia) Sepsis due HCAP:  - Started empirically on Vanc and aztreonam. Blood cultures negative till date.  - De-escalate to Levaquin to complete 7 days course.  - Afebrile overnight No leukocytosis.  - SLP evaluation Minimal risk of aspiration   Acute encephalopathy Now resovled.  - Probably due to narcotics.  - Was given narcan overnight with improvement in her mental status. She received fentanyl and Vicodin for pain.  - D'cd narcotics. Ct head negative for bleed she is on coumadin.  - Awake start low dose narcotics for pain   Rash and nonspecific skin eruption - Unclear etiology. Started on steroids, per ID note she did have biopsy on 11.2015. Was empirically on steroids at that time. Rash was improving.  - Biopsy shows acute inflammation, no viral infection.  - Seen by rheumatologist and dermatologist in 2011 recommended steroids. As per MD note from 11.2011 note there was a suspicion for low-level Stevens-Johnson versus bullous pemphigus.  - de-escalate steroids to orals. Will need follow up with rheumatology   Acute lower GI bleeding - Baseline Hgb appears to be 11 (Feb 2015).  - Pt passing blood clots , most rectal bleeding.  - Held coumadin, given vit K. INR came down to 1.5.  - She does have a history of diverticulosis by CT on 4.26.2015.  - Transfuse 1 unit of PRBC. Repeated CBC Hbg 6.7then transfuse 2 unit of PRBC check a CBC post transfusional.  - consulted GI recommended conservative management. Resume coumadin in 2 weeks. ? ISchemic colitis.      AKI (acute kidney injury) Prob 2/2 GI bleed  Atrial fibrillation Rate controlled with coreg  Systolic and diastolic CHF, chronic TTE 04/2013: EF 50-55%.  - cont home meds - coreg, lisinopril and lasix     Hennie Duos, MD

## 2013-06-29 NOTE — Progress Notes (Signed)
Ok per MD for patient to d/c today back to [facility] via EMS. CSW notified Suanne Marker at [facility] is available for patient today. Ok to return per patient and her daughter [name].  Nursing notified to call report.  No further CSW indicated at this time.  CSW signing off.  Lorie Phenix. [place], [place]

## 2013-07-03 ENCOUNTER — Non-Acute Institutional Stay (SKILLED_NURSING_FACILITY): Payer: Medicare Other | Admitting: Nurse Practitioner

## 2013-07-03 DIAGNOSIS — R109 Unspecified abdominal pain: Secondary | ICD-10-CM

## 2013-07-03 DIAGNOSIS — K5900 Constipation, unspecified: Secondary | ICD-10-CM

## 2013-07-03 DIAGNOSIS — M1990 Unspecified osteoarthritis, unspecified site: Secondary | ICD-10-CM

## 2013-07-03 NOTE — Progress Notes (Signed)
Patient ID: [name], female   DOB: [birth year], 79 y.o.   MRN: [id number]    Nursing Home Location:  [facility]   Place of Service: SNF (17)  PCP: Osborne Casco, MD  Allergies  Allergen Reactions  . Penicillins Other (See Comments)    unknown  . Tetanus-Diphtheria Toxoids Td Other (See Comments)    unknown  . Latex Hives and Rash    Chief Complaint  Patient presents with  . Acute Visit    HPI:  79 year old female being seen today at the request of staff due to increased pain. Pt has been refusing therapy services due to pain.  Pt reports her pain is chronic, on and off, pain medications help when she gets them. Reports she does ask for them frequently. No side effects noted from medications however she does not constipation. Reports her joints hurt her and abdomen hurts-- without changes in pain  Review of Systems:  Review of Systems  Constitutional: Negative for fever, chills and malaise/fatigue.  Respiratory: Positive for shortness of breath (chronic- without changes). Negative for cough.   Cardiovascular: Negative for chest pain and leg swelling.  Gastrointestinal: Positive for abdominal pain (generalized) and constipation.  Genitourinary: Negative for dysuria, urgency and frequency.  Musculoskeletal: Positive for back pain, joint pain and myalgias. Negative for falls.  Neurological: Negative for dizziness and weakness.     Past Medical History  Diagnosis Date  . HTN (hypertension)   . Diverticulosis   . Hemorrhoid   . Osteoarthritis   . Constipation, chronic   . H/O: hysterectomy   . Systolic and diastolic CHF, chronic     a. Echo 07/2011: Technically limited; inferior HK, inferolateral and possibly lateral wall HK, EF 40%, moderate LVE, aortic sclerosis without stenosis, mild MR, mild LAE, question RV dysfunction;   b.  Echo (04/2013): EF 50-55%, normal wall motion, moderate MR, mild to moderate LAE, PASP 37  . Female bladder prolapse       with prolapse uterus, cystocele, s/p TVH/BSO 12/2006  . Obesity (BMI 30-39.9)     wt. 94.1 kg 12/2010  . Moderate mitral regurgitation     a. Mod to Sev by echo 01/2009;  b. mild MR by echo 2013  . Atrial fibrillation 10/13/2011    coumadin Rx  . Iron deficiency anemia    Past Surgical History  Procedure Laterality Date  . Wrist surgery    . Vaginal hysterectomy,cystocele and prolapse  repair  NOV. 2008  . Abdominal hysterectomy     Social History:   reports that she has never smoked. Her smokeless tobacco use includes Snuff. She reports that she does not drink alcohol or use illicit drugs.  Family History  Problem Relation Age of Onset  . Diabetes Brother   . Hypertension Mother     deceased @ 17  . Leukemia Brother   . Cancer Brother   . Stroke Mother   . Diabetes Father     deceased in his 30's    Medications: Patient's Medications  New Prescriptions   No medications on file  Previous Medications   AMINO ACIDS-PROTEIN HYDROLYS (FEEDING SUPPLEMENT, PRO-STAT SUGAR FREE 64,) LIQD    Take 30 mLs by mouth 2 (two) times daily. 8am and 4:30pm   CARVEDILOL (COREG) 6.25 MG TABLET    Take 6.25 mg by mouth 2 (two) times daily with a meal.   DOCUSATE SODIUM (COLACE) 100 MG CAPSULE    Take 100 mg by mouth  daily.   FERROUS FUMARATE (FERRETTS) 325 (106 FE) MG TABS    Take 1 tablet by mouth 2 (two) times daily. 8am and 4pm   FERROUS SULFATE 325 (65 FE) MG EC TABLET    Take 1 tablet (325 mg total) by mouth 3 (three) times daily with meals.   FUROSEMIDE (LASIX) 40 MG TABLET    Take 40 mg by mouth every evening. Daily at 4pm   FUROSEMIDE (LASIX) 80 MG TABLET    Take 80 mg by mouth daily.    HYDROCODONE-ACETAMINOPHEN (NORCO/VICODIN) 5-325 MG PER TABLET    Take one tablet by mouth every 8 hours as needed for pain   LISINOPRIL (PRINIVIL,ZESTRIL) 5 MG TABLET    Take 5 mg by mouth daily.   MAGNESIUM HYDROXIDE (MILK OF MAGNESIA) 400 MG/5ML SUSPENSION    Take 30 mLs by mouth daily as needed  for mild constipation.   PANTOPRAZOLE (PROTONIX) 40 MG TABLET    Take 1 tablet (40 mg total) by mouth 2 (two) times daily.   PREDNISONE (DELTASONE) 20 MG TABLET    Take 2 tablets (40 mg total) by mouth daily with breakfast.   TRAMADOL (ULTRAM) 50 MG TABLET    Take one tablet by mouth every 8 hours as needed for pain  Modified Medications   No medications on file  Discontinued Medications   LEVOFLOXACIN (LEVAQUIN) 750 MG TABLET    Take 1 tablet (750 mg total) by mouth every other day.     Physical Exam: Physical Exam  Constitutional: She is well-developed, well-nourished, and in no distress.  HENT:  Mouth/Throat: Oropharynx is clear and moist. No oropharyngeal exudate.  Eyes: Conjunctivae and EOM are normal. Pupils are equal, round, and reactive to light.  Neck: Normal range of motion. Neck supple.  Cardiovascular: Normal rate and regular rhythm.   Murmur heard. Pulmonary/Chest: Effort normal and breath sounds normal.  Abdominal: Soft. Bowel sounds are normal. There is tenderness (generalized ).  Musculoskeletal: She exhibits no edema and no tenderness.  Neurological: She is alert.  Skin: Skin is warm and dry.    Filed Vitals:   07/03/13 1445  BP: 114/60  Pulse: 60  Temp: 97.9 F (36.6 C)  Resp: 20      Labs reviewed: Basic Metabolic Panel:  Recent Labs  06/03/13 1825  06/05/13 1200 06/06/13 0233  06/24/13 0750 06/25/13 0422 06/26/13 0445  NA  --   < >  --  129*  < > 128* 126* 136*  K  --   < > 4.1 3.8  < > 5.1 5.1 5.1  CL  --   < >  --  96  < > 92* 91* 103  CO2  --   < >  --  22  < > 25 25 27   GLUCOSE  --   < >  --  90  < > 82 91 75  BUN  --   < >  --  29*  < > 50* 47* 32*  CREATININE  --   < >  --  1.45*  < > 1.77* 1.45* 1.13*  CALCIUM  --   < >  --  7.4*  < > 7.6* 7.4* 7.5*  MG 2.6*  --  2.0 1.9  --   --   --   --   PHOS 3.2  --   --   --   --   --   --   --   < > = values in  this interval not displayed. Liver Function Tests:  Recent Labs   06/05/13 0242 06/07/13 0540 06/19/13 2050  AST 15 8 18   ALT 29 17 7   ALKPHOS 109 104 112  BILITOT 0.5 0.3 0.3  PROT 6.1 5.9* 6.6  ALBUMIN 3.0* 2.8* 2.7*    Recent Labs  06/03/13 1135  LIPASE 9*    Recent Labs  06/04/13 1120  AMMONIA 65*   CBC:  Recent Labs  06/19/13 2050 06/20/13 0428  06/24/13 2026 06/25/13 0422 06/26/13 0445  WBC 8.0 6.9  < > 17.5* 17.9* 15.2*  NEUTROABS 6.8 6.1  --  15.8*  --   --   HGB 9.0* 9.0*  < > 7.8* 7.7* 7.7*  HCT 27.5* 27.9*  < > 23.0* 22.9* 23.1*  MCV 92.3 93.0  < > 88.8 89.8 92.0  PLT 433* 430*  < > 265 283 290  < > = values in this interval not displayed. Cardiac Enzymes:  Recent Labs  06/03/13 2335 06/04/13 0333 06/19/13 2050  CKTOTAL  --   --  61  TROPONINI <0.30 <0.30 <0.30   BNP: No components found with this basename: POCBNP,  CBG:  Recent Labs  06/26/13 0639 06/26/13 0731 06/26/13 1100  GLUCAP 62* 85 111*   TSH:  Recent Labs  03/14/13 1208 06/03/13 1825  TSH 1.48 2.070   A1C: Lab Results  Component Value Date   HGBA1C 5.6 06/03/2013     Assessment/Plan 1. Abdominal pain, unspecified site -has been chronic but pt complaining of pain, reports pain medication has helped -also reports constipation, will change colace to senna- s BID -will scheduled ultram 50 mg TID for now.   2. Osteoarthritis -pt reports she hurts all over and worse with activity therefore does not want to participate in therapy at times, will schedule ultram at this ttime  3. Constipation  will change colace to senna- s BID

## 2013-07-04 ENCOUNTER — Other Ambulatory Visit: Payer: Self-pay | Admitting: *Deleted

## 2013-07-04 MED ORDER — TRAMADOL HCL 50 MG PO TABS: ORAL_TABLET | ORAL | Status: DC

## 2013-07-04 NOTE — Telephone Encounter (Signed)
[facility] 

## 2013-07-11 ENCOUNTER — Encounter: Payer: Self-pay | Admitting: Physician Assistant

## 2013-07-11 ENCOUNTER — Telehealth: Payer: Self-pay | Admitting: *Deleted

## 2013-07-11 ENCOUNTER — Ambulatory Visit (INDEPENDENT_AMBULATORY_CARE_PROVIDER_SITE_OTHER): Payer: Medicare Other | Admitting: Physician Assistant

## 2013-07-11 VITALS — BP 90/58 | HR 79 | Ht 61.0 in | Wt 233.0 lb

## 2013-07-11 DIAGNOSIS — N179 Acute kidney failure, unspecified: Secondary | ICD-10-CM

## 2013-07-11 DIAGNOSIS — I4891 Unspecified atrial fibrillation: Secondary | ICD-10-CM

## 2013-07-11 DIAGNOSIS — D649 Anemia, unspecified: Secondary | ICD-10-CM

## 2013-07-11 DIAGNOSIS — I5042 Chronic combined systolic (congestive) and diastolic (congestive) heart failure: Secondary | ICD-10-CM

## 2013-07-11 DIAGNOSIS — I509 Heart failure, unspecified: Secondary | ICD-10-CM

## 2013-07-11 DIAGNOSIS — Z8719 Personal history of other diseases of the digestive system: Secondary | ICD-10-CM | POA: Insufficient documentation

## 2013-07-11 DIAGNOSIS — I10 Essential (primary) hypertension: Secondary | ICD-10-CM

## 2013-07-11 DIAGNOSIS — R109 Unspecified abdominal pain: Secondary | ICD-10-CM

## 2013-07-11 LAB — BASIC METABOLIC PANEL
BUN: 31 mg/dL — ABNORMAL HIGH (ref 6–23)
CHLORIDE: 100 meq/L (ref 96–112)
CO2: 30 mEq/L (ref 19–32)
Calcium: 7.6 mg/dL — ABNORMAL LOW (ref 8.4–10.5)
Creatinine, Ser: 1.1 mg/dL (ref 0.4–1.2)
GFR: 65.38 mL/min (ref >60.00)
Glucose, Bld: 93 mg/dL (ref 70–99)
Potassium: 3.3 mEq/L — ABNORMAL LOW (ref 3.5–5.1)
Sodium: 136 mEq/L (ref 135–145)

## 2013-07-11 MED ORDER — LISINOPRIL 2.5 MG PO TABS: 2.5000 mg | ORAL_TABLET | Freq: Every day | ORAL | Status: DC

## 2013-07-11 MED ORDER — CARVEDILOL 3.125 MG PO TABS: 3.1250 mg | ORAL_TABLET | Freq: Two times a day (BID) | ORAL | Status: DC

## 2013-07-11 NOTE — Progress Notes (Signed)
Cardiology Office Note   Date:  07/11/2013   ID:  [name], DOB January 25, 1935, MRN [id number]  PCP:  Osborne Casco, MD  Cardiologist:  Dr. Minus Breeding      History of Present Illness: [name] is a 79 y.o. female with a hx of combined systolic and diastolic CHF, DCM with an EF of 40%, HTN. She had several admissions in 2013 with CHF. She was noted to be in new onset AFib in 10/2011 and placed on Coumadin. Of note, she spontaneously converted to NSR.   Follow up echo in 04/2013 demonstrated normal LVF (EF 50-55%), no RWMA, mild to mod LAE, mod MR, PASP 37 mmHg.    Last seen here in 04/2013.  She complained of weight loss and follow up with her PCP was recommended.  She has been admitted x 2 since then.  Admitted 4/26-4/30 with severe abdominal pain.  No clear source was found.  She had some confusion and AKI (creatinine increased to 1.7 peak).  INR was > 10 upon admission and was given Vitamin K.  D/c to SNF.  Readmitted 5/12-5/19 with pneumonia and worsening anemia felt to be 2/2 LGI bleeding from ischemic colitis.  She was taken off of coumadin and seen by GI.  Notes from GI indicates she could resume coumadin in 2 weeks.  But, she was to also follow up with her primary GI as an outpatient for possible follow up colonoscopy.  She returns for follow up.    She is staying at [facility].  She went to her gastroenterology appointment earlier this week. She was apparently actively bleeding at the time. She was told to go the emergency room. Instead, she went back to the nursing home. Her bleeding eventually stopped. She apparently had a CBC drawn earlier today. She continues to be in a significant amount of pain. She points to her epigastrium and chest. She is quite weak. She denies orthopnea, PND. She has noted some increased LE edema. Her weight continues to decline. She denies syncope.   Studies:   Abdominal/Pelvic CT with IV and Oral contrast (06/03/13): IMPRESSION: No acute  abnormality identified in the abdomen or pelvis. No etiology for abdominal pain identified.   Head CT without Contrast (06/20/13): IMPRESSION: Atrophy and chronic ischemic white matter disease without acute intracranial abnormality.   Abdominal US (06/06/13): IMPRESSION:  Study within normal limits.   Recent Labs: 06/03/2013: TSH 2.070  06/19/2013: ALT 7  06/22/2013: Pro B Natriuretic peptide (BNP) 6129.0*  06/26/2013: Creatinine 1.13*; Hemoglobin 7.7*; Potassium 5.1   Wt Readings from Last 3 Encounters:  07/11/13 233 lb (105.688 kg)  06/26/13 232 lb 2.3 oz (105.3 kg)  06/07/13 248 lb 0.3 oz (112.5 kg)     Past Medical History  Diagnosis Date  . HTN (hypertension)   . Diverticulosis   . Hemorrhoid   . Osteoarthritis   . Constipation, chronic   . H/O: hysterectomy   . Systolic and diastolic CHF, chronic     a. Echo 07/2011: Technically limited; inferior HK, inferolateral and possibly lateral wall HK, EF 40%, moderate LVE, aortic sclerosis without stenosis, mild MR, mild LAE, question RV dysfunction;   b.  Echo (04/2013): EF 50-55%, normal wall motion, moderate MR, mild to moderate LAE, PASP 37  . Female bladder prolapse     with prolapse uterus, cystocele, s/p TVH/BSO 12/2006  . Obesity (BMI 30-39.9)     wt. 94.1 kg 12/2010  . Moderate mitral regurgitation  a. Mod to Sev by echo 01/2009;  b. mild MR by echo 2013  . Atrial fibrillation 10/13/2011    coumadin Rx  . Iron deficiency anemia     Current Outpatient Prescriptions  Medication Sig Dispense Refill  . Amino Acids-Protein Hydrolys (FEEDING SUPPLEMENT, PRO-STAT SUGAR FREE 64,) LIQD Take 30 mLs by mouth 2 (two) times daily. 8am and 4:30pm      . bisacodyl (DULCOLAX) 10 MG suppository Place 10 mg rectally daily as needed for moderate constipation.      . carvedilol (COREG) 6.25 MG tablet Take 6.25 mg by mouth 2 (two) times daily with a meal.      . docusate sodium (COLACE) 100 MG capsule Take 100 mg by mouth daily.       . ferrous fumarate (FERRETTS) 325 (106 FE) MG TABS Take 1 tablet by mouth 2 (two) times daily. 8am and 4pm      . furosemide (LASIX) 80 MG tablet Take 80 mg by mouth daily.       Marland Kitchen HYDROcodone-acetaminophen (NORCO/VICODIN) 5-325 MG per tablet Take one tablet by mouth every 8 hours as needed for pain  90 tablet  0  . lisinopril (PRINIVIL,ZESTRIL) 5 MG tablet Take 5 mg by mouth daily.      . magnesium hydroxide (MILK OF MAGNESIA) 400 MG/5ML suspension Take 30 mLs by mouth daily as needed for mild constipation.      Marland Kitchen omeprazole (PRILOSEC) 20 MG capsule Take 20 mg by mouth daily.      . predniSONE (DELTASONE) 20 MG tablet Take 2 tablets (40 mg total) by mouth daily with breakfast.  20 tablet  0  . promethazine (PHENERGAN) 25 MG tablet Take 25 mg by mouth every 6 (six) hours as needed for nausea or vomiting.      . traMADol (ULTRAM) 50 MG tablet Take one tablet by mouth three times daily around the clock. Hold for sedation  90 tablet  5   No current facility-administered medications for this visit.    Allergies:   Penicillins; Tetanus-diphtheria toxoids td; and Latex   Social History:  The patient  reports that she has never smoked. Her smokeless tobacco use includes Snuff. She reports that she does not drink alcohol or use illicit drugs.   Family History:  The patient's family history includes Cancer in her brother; Diabetes in her brother and father; Hypertension in her mother; Leukemia in her brother; Stroke in her mother.   ROS:  Please see the history of present illness.      All other systems reviewed and negative.   PHYSICAL EXAM: VS:  BP 90/58  Pulse 79  Ht 5\' 1"  (1.549 m)  Wt 233 lb (105.688 kg)  BMI 44.05 kg/m2 Well nourished, well developed female who winces in pain and occasionally HEENT: normal Neck: no JVDat 90 Cardiac:  normal S1, S2; RRR; no murmur Lungs:  Decreased breath sounds bilaterally, no wheezing, rhonchi or rales Abd: soft, nontender, no hepatomegaly Ext:  trace bilateral LE edema Skin: warm and dry Neuro:  CNs 2-12 intact, no focal abnormalities noted  EKG:  NSR, HR 79, LAD, IVCD, no change from prior tracing     ASSESSMENT AND PLAN:  1. Chronic Combined Systolic and Diastolic CHF: Overall, her volume appears to be stable. She does have some LE edema. However, her albumin recently was 2.7. I suspect protein calorie malnutrition has contributed to her LE edema. She has developed hypotension in the setting of anemia. She is not tolerating  her current CHF medications. I will decrease her Coreg to 3.125 mg twice a day. I will also decrease her lisinopril to 2.5 mg daily. Check a follow up basic metabolic panel today. 2. Atrial fibrillation: Maintaining NSR. She is obviously not a candidate for Coumadin at this time. We will continue to reevaluate her candidacy for Coumadin in follow up. 3. HYPERTENSION: Blood pressure now running low. Adjust medications as noted. 4. History of lower GI bleeding: She continues to have lower GI bleeding. As noted, her skilled nursing facility obtained a CBC today. I do not have those results.  She needs follow up with gastroenterology. We will try to arrange this. 5. AKI (acute kidney injury): She had worsening creatinine in the hospital. Basic metabolic panel will be obtained again today. 6. Anemia: Related to lower GI bleeding. As noted, CBC is pending. 7. Abdominal pain:  Etiology not clear. She had a fairly negative workup in the hospital. She likely needs follow up endoscopy and colonoscopy. As noted, we will try to facilitate f/u with gastroenterology. 8. Disposition:  F/u with Dr. Minus Breeding in 1 month.   Signed, Versie Starks, MHS 07/11/2013 12:27 PM    [facility] [address], [place], [place]  [address] Phone: [phone]; Fax: [phone]

## 2013-07-11 NOTE — Patient Instructions (Signed)
Your physician has recommended you make the following change in your medication:  1. DECREASE COREG TO 3.125 MG TWICE DAILY 2. DECREASE LISINOPRIL TO 2.5 MG DADILY  LAB WORK TODAY; BMET  You have been referred to DR. [place]; DX GI BLEED  Your physician recommends that you schedule a follow-up appointment in: [place] DR. [place] OFFICE

## 2013-07-11 NOTE — Telephone Encounter (Signed)
s/w Talbert Nan, RN at [facility] about pt's lab results and recommendations for med changes . Talbert Nan did also advise me that pt's CBC w/diff they got this morning came back Hgb 6.9, states s/w house dr for facility and pt sent to [facility] for 2 units RBC's

## 2013-07-12 ENCOUNTER — Telehealth (HOSPITAL_COMMUNITY): Payer: Self-pay | Admitting: Hematology

## 2013-07-12 NOTE — Telephone Encounter (Signed)
Returned call to nurse Sharlett Iles at [facility].  Per Sharlett Iles, Ms. Glasser is A&O, able to ambulate small distances with minimal assitance, patient is able to transfer herself from wheelchair to toilet, and can tolerate sitting in a recliner for the duration of the infusion.  I explained that we would be able to do the transfusion, and transferred the call to [facility] to schedule.

## 2013-07-13 ENCOUNTER — Encounter (HOSPITAL_COMMUNITY): Payer: Self-pay | Admitting: Emergency Medicine

## 2013-07-13 ENCOUNTER — Non-Acute Institutional Stay (HOSPITAL_COMMUNITY)
Admission: AD | Admit: 2013-07-13 | Discharge: 2013-07-13 | Disposition: A | Discharge status: Home / Self Care | Payer: Medicare Other | Source: Ambulatory Visit | Attending: Internal Medicine | Admitting: Internal Medicine

## 2013-07-13 ENCOUNTER — Other Ambulatory Visit (HOSPITAL_COMMUNITY): Payer: Self-pay | Admitting: Internal Medicine

## 2013-07-13 ENCOUNTER — Inpatient Hospital Stay (HOSPITAL_COMMUNITY)
Admission: EM | Admit: 2013-07-13 | Discharge: 2013-07-19 | DRG: 378 | Disposition: A | Discharge status: Skilled Nursing Facility | Payer: Medicare Other | Attending: Family Medicine | Admitting: Family Medicine

## 2013-07-13 ENCOUNTER — Emergency Department (HOSPITAL_COMMUNITY): Payer: Medicare Other

## 2013-07-13 DIAGNOSIS — Z79899 Other long term (current) drug therapy: Secondary | ICD-10-CM

## 2013-07-13 DIAGNOSIS — R7989 Other specified abnormal findings of blood chemistry: Secondary | ICD-10-CM

## 2013-07-13 DIAGNOSIS — Z806 Family history of leukemia: Secondary | ICD-10-CM

## 2013-07-13 DIAGNOSIS — I110 Hypertensive heart disease with heart failure: Secondary | ICD-10-CM | POA: Diagnosis present

## 2013-07-13 DIAGNOSIS — D509 Iron deficiency anemia, unspecified: Secondary | ICD-10-CM

## 2013-07-13 DIAGNOSIS — K922 Gastrointestinal hemorrhage, unspecified: Principal | ICD-10-CM

## 2013-07-13 DIAGNOSIS — R5381 Other malaise: Secondary | ICD-10-CM

## 2013-07-13 DIAGNOSIS — Z5181 Encounter for therapeutic drug level monitoring: Secondary | ICD-10-CM

## 2013-07-13 DIAGNOSIS — I4891 Unspecified atrial fibrillation: Secondary | ICD-10-CM

## 2013-07-13 DIAGNOSIS — Z7901 Long term (current) use of anticoagulants: Secondary | ICD-10-CM

## 2013-07-13 DIAGNOSIS — Z8719 Personal history of other diseases of the digestive system: Secondary | ICD-10-CM

## 2013-07-13 DIAGNOSIS — K921 Melena: Secondary | ICD-10-CM

## 2013-07-13 DIAGNOSIS — R609 Edema, unspecified: Secondary | ICD-10-CM

## 2013-07-13 DIAGNOSIS — E871 Hypo-osmolality and hyponatremia: Secondary | ICD-10-CM

## 2013-07-13 DIAGNOSIS — Z880 Allergy status to penicillin: Secondary | ICD-10-CM

## 2013-07-13 DIAGNOSIS — D75839 Thrombocytosis, unspecified: Secondary | ICD-10-CM

## 2013-07-13 DIAGNOSIS — D72819 Decreased white blood cell count, unspecified: Secondary | ICD-10-CM

## 2013-07-13 DIAGNOSIS — I428 Other cardiomyopathies: Secondary | ICD-10-CM

## 2013-07-13 DIAGNOSIS — Z8249 Family history of ischemic heart disease and other diseases of the circulatory system: Secondary | ICD-10-CM

## 2013-07-13 DIAGNOSIS — Z823 Family history of stroke: Secondary | ICD-10-CM

## 2013-07-13 DIAGNOSIS — I059 Rheumatic mitral valve disease, unspecified: Secondary | ICD-10-CM | POA: Diagnosis present

## 2013-07-13 DIAGNOSIS — K5909 Other constipation: Secondary | ICD-10-CM

## 2013-07-13 DIAGNOSIS — Z993 Dependence on wheelchair: Secondary | ICD-10-CM

## 2013-07-13 DIAGNOSIS — I10 Essential (primary) hypertension: Secondary | ICD-10-CM

## 2013-07-13 DIAGNOSIS — I340 Nonrheumatic mitral (valve) insufficiency: Secondary | ICD-10-CM

## 2013-07-13 DIAGNOSIS — J189 Pneumonia, unspecified organism: Secondary | ICD-10-CM

## 2013-07-13 DIAGNOSIS — R21 Rash and other nonspecific skin eruption: Secondary | ICD-10-CM

## 2013-07-13 DIAGNOSIS — Z833 Family history of diabetes mellitus: Secondary | ICD-10-CM

## 2013-07-13 DIAGNOSIS — I272 Pulmonary hypertension, unspecified: Secondary | ICD-10-CM

## 2013-07-13 DIAGNOSIS — R109 Unspecified abdominal pain: Secondary | ICD-10-CM

## 2013-07-13 DIAGNOSIS — G8929 Other chronic pain: Secondary | ICD-10-CM

## 2013-07-13 DIAGNOSIS — I509 Heart failure, unspecified: Secondary | ICD-10-CM | POA: Diagnosis present

## 2013-07-13 DIAGNOSIS — K649 Unspecified hemorrhoids: Secondary | ICD-10-CM | POA: Diagnosis present

## 2013-07-13 DIAGNOSIS — R600 Localized edema: Secondary | ICD-10-CM

## 2013-07-13 DIAGNOSIS — N8110 Cystocele, unspecified: Secondary | ICD-10-CM

## 2013-07-13 DIAGNOSIS — K5900 Constipation, unspecified: Secondary | ICD-10-CM | POA: Diagnosis present

## 2013-07-13 DIAGNOSIS — J9611 Chronic respiratory failure with hypoxia: Secondary | ICD-10-CM

## 2013-07-13 DIAGNOSIS — D62 Acute posthemorrhagic anemia: Secondary | ICD-10-CM | POA: Diagnosis present

## 2013-07-13 DIAGNOSIS — E876 Hypokalemia: Secondary | ICD-10-CM

## 2013-07-13 DIAGNOSIS — N179 Acute kidney failure, unspecified: Secondary | ICD-10-CM

## 2013-07-13 DIAGNOSIS — E872 Acidosis, unspecified: Secondary | ICD-10-CM

## 2013-07-13 DIAGNOSIS — I5042 Chronic combined systolic (congestive) and diastolic (congestive) heart failure: Secondary | ICD-10-CM

## 2013-07-13 DIAGNOSIS — Z6841 Body Mass Index (BMI) 40.0 and over, adult: Secondary | ICD-10-CM

## 2013-07-13 DIAGNOSIS — K5730 Diverticulosis of large intestine without perforation or abscess without bleeding: Secondary | ICD-10-CM | POA: Diagnosis present

## 2013-07-13 DIAGNOSIS — I5030 Unspecified diastolic (congestive) heart failure: Secondary | ICD-10-CM

## 2013-07-13 DIAGNOSIS — D473 Essential (hemorrhagic) thrombocythemia: Secondary | ICD-10-CM

## 2013-07-13 DIAGNOSIS — K626 Ulcer of anus and rectum: Secondary | ICD-10-CM | POA: Diagnosis present

## 2013-07-13 DIAGNOSIS — G9340 Encephalopathy, unspecified: Secondary | ICD-10-CM

## 2013-07-13 DIAGNOSIS — E669 Obesity, unspecified: Secondary | ICD-10-CM | POA: Diagnosis present

## 2013-07-13 DIAGNOSIS — R531 Weakness: Secondary | ICD-10-CM

## 2013-07-13 DIAGNOSIS — E860 Dehydration: Secondary | ICD-10-CM

## 2013-07-13 DIAGNOSIS — I5043 Acute on chronic combined systolic (congestive) and diastolic (congestive) heart failure: Secondary | ICD-10-CM

## 2013-07-13 DIAGNOSIS — R5383 Other fatigue: Secondary | ICD-10-CM

## 2013-07-13 DIAGNOSIS — K219 Gastro-esophageal reflux disease without esophagitis: Secondary | ICD-10-CM | POA: Diagnosis present

## 2013-07-13 LAB — COMPREHENSIVE METABOLIC PANEL
ALT: 10 U/L (ref 0–35)
AST: 11 U/L (ref 0–37)
Albumin: 3 g/dL — ABNORMAL LOW (ref 3.5–5.2)
Alkaline Phosphatase: 88 U/L (ref 39–117)
BILIRUBIN TOTAL: 0.2 mg/dL — AB (ref 0.3–1.2)
BUN: 30 mg/dL — ABNORMAL HIGH (ref 6–23)
CHLORIDE: 101 meq/L (ref 96–112)
CO2: 29 meq/L (ref 19–32)
Calcium: 8.2 mg/dL — ABNORMAL LOW (ref 8.4–10.5)
Creatinine, Ser: 1.05 mg/dL (ref 0.50–1.10)
GFR calc Af Amer: 58 mL/min — ABNORMAL LOW (ref >90)
GFR, EST NON AFRICAN AMERICAN: 50 mL/min — AB (ref >90)
Glucose, Bld: 77 mg/dL (ref 70–99)
Potassium: 4.2 mEq/L (ref 3.7–5.3)
SODIUM: 140 meq/L (ref 137–147)
Total Protein: 6.2 g/dL (ref 6.0–8.3)

## 2013-07-13 LAB — URINALYSIS, ROUTINE W REFLEX MICROSCOPIC
BILIRUBIN URINE: NEGATIVE
Glucose, UA: NEGATIVE mg/dL
HGB URINE DIPSTICK: NEGATIVE
Ketones, ur: NEGATIVE mg/dL
Leukocytes, UA: NEGATIVE
Nitrite: NEGATIVE
PROTEIN: NEGATIVE mg/dL
SPECIFIC GRAVITY, URINE: 1.006 (ref 1.005–1.030)
Urobilinogen, UA: 0.2 mg/dL (ref 0.0–1.0)
pH: 7 (ref 5.0–8.0)

## 2013-07-13 LAB — CBC WITH DIFFERENTIAL/PLATELET
Basophils Absolute: 0 10*3/uL (ref 0.0–0.1)
Basophils Relative: 0 % (ref 0–1)
EOS PCT: 3 % (ref 0–5)
Eosinophils Absolute: 0.2 10*3/uL (ref 0.0–0.7)
HEMATOCRIT: 27.8 % — AB (ref 36.0–46.0)
Hemoglobin: 8.7 g/dL — ABNORMAL LOW (ref 12.0–15.0)
LYMPHS ABS: 1.5 10*3/uL (ref 0.7–4.0)
LYMPHS PCT: 24 % (ref 12–46)
MCH: 30.9 pg (ref 26.0–34.0)
MCHC: 31.3 g/dL (ref 30.0–36.0)
MCV: 98.6 fL (ref 78.0–100.0)
MONO ABS: 0.4 10*3/uL (ref 0.1–1.0)
Monocytes Relative: 7 % (ref 3–12)
Neutro Abs: 4.1 10*3/uL (ref 1.7–7.7)
Neutrophils Relative %: 66 % (ref 43–77)
Platelets: 259 10*3/uL (ref 150–400)
RBC: 2.82 MIL/uL — ABNORMAL LOW (ref 3.87–5.11)
RDW: 16.8 % — AB (ref 11.5–15.5)
WBC: 6.2 10*3/uL (ref 4.0–10.5)

## 2013-07-13 LAB — PROTIME-INR
INR: 0.97 (ref 0.00–1.49)
Prothrombin Time: 12.7 seconds (ref 11.6–15.2)

## 2013-07-13 LAB — I-STAT CG4 LACTIC ACID, ED
LACTIC ACID, VENOUS: 2.64 mmol/L — AB (ref 0.5–2.2)
Lactic Acid, Venous: 3.73 mmol/L — ABNORMAL HIGH (ref 0.5–2.2)

## 2013-07-13 LAB — LIPASE, BLOOD: Lipase: 13 U/L (ref 11–59)

## 2013-07-13 LAB — GLUCOSE, CAPILLARY: Glucose-Capillary: 77 mg/dL (ref 70–99)

## 2013-07-13 LAB — POC OCCULT BLOOD, ED: Fecal Occult Bld: POSITIVE — AB

## 2013-07-13 LAB — ABO/RH: ABO/RH(D): O POS

## 2013-07-13 LAB — APTT: aPTT: 25 seconds (ref 24–37)

## 2013-07-13 MED ORDER — FUROSEMIDE 40 MG PO TABS
60.0000 mg | ORAL_TABLET | Freq: Every morning | ORAL | Status: DC
  Administered 2013-07-14 – 2013-07-19 (×6): 60 mg via ORAL
  Filled 2013-07-13 (×6): qty 1

## 2013-07-13 MED ORDER — PRO-STAT SUGAR FREE PO LIQD
30.0000 mL | Freq: Two times a day (BID) | ORAL | Status: DC
  Administered 2013-07-13 – 2013-07-19 (×10): 30 mL via ORAL
  Filled 2013-07-13 (×13): qty 30

## 2013-07-13 MED ORDER — IOHEXOL 300 MG/ML  SOLN
100.0000 mL | Freq: Once | INTRAMUSCULAR | Status: AC | PRN
  Administered 2013-07-13: 100 mL via INTRAVENOUS

## 2013-07-13 MED ORDER — SODIUM CHLORIDE 0.9 % IV BOLUS (SEPSIS)
1000.0000 mL | Freq: Once | INTRAVENOUS | Status: AC
  Administered 2013-07-13: 1000 mL via INTRAVENOUS

## 2013-07-13 MED ORDER — ACETAMINOPHEN 325 MG PO TABS: 650.0000 mg | ORAL_TABLET | Freq: Four times a day (QID) | ORAL | Status: DC | PRN

## 2013-07-13 MED ORDER — FERROUS FUMARATE 325 (106 FE) MG PO TABS
1.0000 | ORAL_TABLET | Freq: Two times a day (BID) | ORAL | Status: DC
  Administered 2013-07-13 – 2013-07-19 (×12): 106 mg via ORAL
  Filled 2013-07-13 (×15): qty 1

## 2013-07-13 MED ORDER — CARVEDILOL 3.125 MG PO TABS
3.1250 mg | ORAL_TABLET | Freq: Two times a day (BID) | ORAL | Status: DC
  Administered 2013-07-14 – 2013-07-19 (×10): 3.125 mg via ORAL
  Filled 2013-07-13 (×13): qty 1

## 2013-07-13 MED ORDER — MORPHINE SULFATE 2 MG/ML IJ SOLN
2.0000 mg | INTRAMUSCULAR | Status: DC | PRN
  Administered 2013-07-14 – 2013-07-18 (×5): 2 mg via INTRAVENOUS
  Filled 2013-07-13 (×6): qty 1

## 2013-07-13 MED ORDER — ACETAMINOPHEN 650 MG RE SUPP: 650.0000 mg | Freq: Four times a day (QID) | RECTAL | Status: DC | PRN

## 2013-07-13 MED ORDER — BISACODYL 10 MG RE SUPP: 10.0000 mg | Freq: Every day | RECTAL | Status: DC | PRN

## 2013-07-13 MED ORDER — FLEET ENEMA 7-19 GM/118ML RE ENEM
1.0000 | ENEMA | Freq: Every day | RECTAL | Status: DC | PRN
  Administered 2013-07-18: 1 via RECTAL
  Filled 2013-07-13: qty 1

## 2013-07-13 MED ORDER — IOHEXOL 300 MG/ML  SOLN
50.0000 mL | Freq: Once | INTRAMUSCULAR | Status: AC | PRN
  Administered 2013-07-13: 50 mL via ORAL

## 2013-07-13 MED ORDER — LISINOPRIL 2.5 MG PO TABS
2.5000 mg | ORAL_TABLET | Freq: Every morning | ORAL | Status: DC
  Administered 2013-07-15 – 2013-07-19 (×5): 2.5 mg via ORAL
  Filled 2013-07-13 (×6): qty 1

## 2013-07-13 MED ORDER — HYDROCODONE-ACETAMINOPHEN 5-325 MG PO TABS
1.0000 | ORAL_TABLET | Freq: Three times a day (TID) | ORAL | Status: DC | PRN
  Administered 2013-07-13 – 2013-07-19 (×9): 1 via ORAL
  Filled 2013-07-13 (×9): qty 1

## 2013-07-13 MED ORDER — PREDNISONE 20 MG PO TABS
40.0000 mg | ORAL_TABLET | Freq: Every day | ORAL | Status: DC
  Administered 2013-07-14 – 2013-07-19 (×6): 40 mg via ORAL
  Filled 2013-07-13 (×7): qty 2

## 2013-07-13 MED ORDER — SODIUM CHLORIDE 0.9 % IV SOLN
INTRAVENOUS | Status: AC
  Administered 2013-07-13 – 2013-07-14 (×2): via INTRAVENOUS

## 2013-07-13 MED ORDER — SENNA 8.6 MG PO TABS: 1.0000 | ORAL_TABLET | Freq: Every day | ORAL | Status: DC | PRN

## 2013-07-13 MED ORDER — TRAMADOL HCL 50 MG PO TABS
50.0000 mg | ORAL_TABLET | Freq: Three times a day (TID) | ORAL | Status: DC
Start: 2013-07-13 — End: 2013-07-19
  Administered 2013-07-13 – 2013-07-19 (×17): 50 mg via ORAL
  Filled 2013-07-13 (×17): qty 1

## 2013-07-13 MED ORDER — MAGNESIUM HYDROXIDE 400 MG/5ML PO SUSP: 30.0000 mL | Freq: Every day | ORAL | Status: DC | PRN

## 2013-07-13 MED ORDER — PANTOPRAZOLE SODIUM 40 MG PO TBEC
40.0000 mg | DELAYED_RELEASE_TABLET | Freq: Every day | ORAL | Status: DC
  Administered 2013-07-13 – 2013-07-19 (×7): 40 mg via ORAL
  Filled 2013-07-13 (×8): qty 1

## 2013-07-13 MED ORDER — PROMETHAZINE HCL 25 MG PO TABS: 25.0000 mg | ORAL_TABLET | Freq: Four times a day (QID) | ORAL | Status: DC | PRN

## 2013-07-13 NOTE — H&P (Addendum)
[facility] History and Physical  MCKINSEY KEAGLE CWC:[id number] DOB: [birth year] DOA: 07/13/2013  Referring physician:  PCP: Osborne Casco, MD  Specialists:   Chief Complaint: GI Bleeding  HPI: [name] is a 79 y.o. female  With a history of systolic and diastolic CHF, recent GI bleed with hospitalization for pneumonia, hypertension, that presents to the emergency department from her nursing home.  Unfortunately no family is present at this time, information this history and physical were obtained from the emergency department attending.  Patient resides at [facility]. Patient was seen by Dr. Penelope Coop on Monday, 07/09/2013 for followup appointment. At that time patient was noted to have GI bleeding which was characterized as large, and was told to go to the emergency department.  Patient was also noted to have a hemoglobin of 6.9 at [facility] approximately 3 days ago.  Patient however did not present to the emergency department until 4 days later. At this time her hemoglobin is noted to be 8.7. Patient has received transfusions at her last hospitalization. Patient also has a history of atrial fibrillation however is no longer on Coumadin as this was held her last hospitalization. At this time patient only complains of rib pain. She cannot give any information regarding her GI bleed. She states she has noted some blood in her pants. Patient cannot quantify the amount. Patient denies any constipation or diarrhea at this time. She does state she is very hungry.  Review of Systems:  Constitutional: Denies fever, chills, diaphoresis, appetite change and fatigue.  HEENT: Denies photophobia, eye pain, redness, hearing loss, ear pain, congestion, sore throat, rhinorrhea, sneezing, mouth sores, trouble swallowing, neck pain, neck stiffness and tinnitus.   Respiratory: Denies SOB, DOE, cough, chest tightness,  and wheezing.   Cardiovascular: Denies chest pain,  palpitations Gastrointestinal: Complains of abdominal pain and bleeding- cannot specify Genitourinary: Denies dysuria, urgency, frequency, hematuria, flank pain and difficulty urinating.  Musculoskeletal: Complains of rib pain and some leg pain.  Skin: Denies pallor, rash and wound.  Neurological: Denies dizziness, seizures, syncope, weakness, light-headedness, numbness and headaches.  Hematological: Denies adenopathy. Easy bruising, personal or family bleeding history  Psychiatric/Behavioral: Denies suicidal ideation, mood changes, confusion, nervousness, sleep disturbance and agitation  Past Medical History  Diagnosis Date  . HTN (hypertension)   . Diverticulosis   . Hemorrhoid   . Osteoarthritis   . Constipation, chronic   . H/O: hysterectomy   . Systolic and diastolic CHF, chronic     a. Echo 07/2011: Technically limited; inferior HK, inferolateral and possibly lateral wall HK, EF 40%, moderate LVE, aortic sclerosis without stenosis, mild MR, mild LAE, question RV dysfunction;   b.  Echo (04/2013): EF 50-55%, normal wall motion, moderate MR, mild to moderate LAE, PASP 37  . Female bladder prolapse     with prolapse uterus, cystocele, s/p TVH/BSO 12/2006  . Obesity (BMI 30-39.9)     wt. 94.1 kg 12/2010  . Moderate mitral regurgitation     a. Mod to Sev by echo 01/2009;  b. mild MR by echo 2013  . Atrial fibrillation 10/13/2011    coumadin Rx  . Iron deficiency anemia    Past Surgical History  Procedure Laterality Date  . Wrist surgery    . Vaginal hysterectomy,cystocele and prolapse  repair  NOV. 2008  . Abdominal hysterectomy     Social History:  reports that she has never smoked. Her smokeless tobacco use includes Snuff. She reports that she does not drink alcohol or  use illicit drugs. She currently resides at [facility].  She is wheelchair bound.    Allergies  Allergen Reactions  . Levaquin [Levofloxacin] Other (See Comments)    Per MAR  . Penicillins Other (See  Comments)    Per MAR  . Tetanus-Diphtheria Toxoids Td Other (See Comments)    Per MAR  . Latex Hives and Rash    Family History  Problem Relation Age of Onset  . Diabetes Brother   . Hypertension Mother     deceased @ 77  . Leukemia Brother   . Cancer Brother   . Stroke Mother   . Diabetes Father     deceased in his 38's    Prior to Admission medications   Medication Sig Start Date End Date Taking? Authorizing Provider  Amino Acids-Protein Hydrolys (FEEDING SUPPLEMENT, PRO-STAT SUGAR FREE 64,) LIQD Take 30 mLs by mouth 2 (two) times daily. 0800 and 1600   Yes Historical Provider, MD  bisacodyl (DULCOLAX) 10 MG suppository Place 10 mg rectally daily as needed for moderate constipation.   Yes Historical Provider, MD  carvedilol (COREG) 3.125 MG tablet Take 3.125 mg by mouth 2 (two) times daily with a meal. Hold if SBP <100   Yes Historical Provider, MD  ferrous fumarate (FERRETTS) 325 (106 FE) MG TABS Take 1 tablet by mouth 2 (two) times daily. 8am and 4pm   Yes Historical Provider, MD  furosemide (LASIX) 20 MG tablet Take 60 mg by mouth every morning.   Yes Historical Provider, MD  HYDROcodone-acetaminophen (NORCO/VICODIN) 5-325 MG per tablet Take 1 tablet by mouth every 8 (eight) hours as needed for moderate pain.   Yes Historical Provider, MD  lisinopril (PRINIVIL,ZESTRIL) 5 MG tablet Take 2.5 mg by mouth every morning.   Yes Historical Provider, MD  magnesium hydroxide (MILK OF MAGNESIA) 400 MG/5ML suspension Take 30 mLs by mouth daily as needed for mild constipation.   Yes Historical Provider, MD  omeprazole (PRILOSEC) 20 MG capsule Take 20 mg by mouth 2 (two) times daily.    Yes Historical Provider, MD  predniSONE (DELTASONE) 20 MG tablet Take 40 mg by mouth daily with breakfast.   Yes Historical Provider, MD  promethazine (PHENERGAN) 25 MG tablet Take 25 mg by mouth every 6 (six) hours as needed for nausea or vomiting.   Yes Historical Provider, MD  senna (SENOKOT) 8.6 MG TABS  tablet Take 1 tablet by mouth daily as needed for mild constipation or moderate constipation.   Yes Historical Provider, MD  sodium phosphate (FLEET) enema Place 1 enema rectally daily as needed (constipation). follow package directions   Yes Historical Provider, MD  traMADol (ULTRAM) 50 MG tablet Take 50 mg by mouth 3 (three) times daily.   Yes Historical Provider, MD   Physical Exam: Filed Vitals:   07/13/13 1501  BP: 133/71  Pulse: 74  Temp: 97.4 F (36.3 C)  Resp:      General: Well developed, well nourished, NAD, appears stated age  HEENT: NCAT, PERRLA, EOMI, Anicteic Sclera, mucous membranes moist.   Neck: Supple, no JVD, no masses  Cardiovascular: S1 S2 auscultated, 2/6 SEM, RRR  Respiratory: Clear to auscultation bilaterally with equal chest rise  Abdomen: Soft, obese, diffusely tender, nondistended, + bowel sounds  GU/Rectal: External hemorrhoid  Extremities: warm dry without cyanosis clubbing.  Trace LE edema B/L  Neuro: AAOx3, cranial nerves grossly intact.  Skin: Without rashes exudates or nodules  Psych: Normal affect and demeanor with intact judgement and insight  Labs  on Admission:  Basic Metabolic Panel:  Recent Labs Lab 07/11/13 1303 07/13/13 1325  NA 136 140  K 3.3* 4.2  CL 100 101  CO2 30 29  GLUCOSE 93 77  BUN 31* 30*  CREATININE 1.1 1.05  CALCIUM 7.6* 8.2*   Liver Function Tests:  Recent Labs Lab 07/13/13 1325  AST 11  ALT 10  ALKPHOS 88  BILITOT 0.2*  PROT 6.2  ALBUMIN 3.0*    Recent Labs Lab 07/13/13 1325  LIPASE 13   No results found for this basename: AMMONIA,  in the last 168 hours CBC:  Recent Labs Lab 07/13/13 1325  WBC 6.2  NEUTROABS 4.1  HGB 8.7*  HCT 27.8*  MCV 98.6  PLT 259   Cardiac Enzymes: No results found for this basename: CKTOTAL, CKMB, CKMBINDEX, TROPONINI,  in the last 168 hours  BNP (last 3 results)  Recent Labs  06/03/13 1825 06/22/13 0621  PROBNP 27115.0* 6129.0*   CBG: No  results found for this basename: GLUCAP,  in the last 168 hours  Radiological Exams on Admission: Ct Abdomen Pelvis W Contrast  07/13/2013   CLINICAL DATA:  Abdominal pain  EXAM: CT ABDOMEN AND PELVIS WITH CONTRAST  TECHNIQUE: Multidetector CT imaging of the abdomen and pelvis was performed using the standard protocol following bolus administration of intravenous contrast.  CONTRAST:  148mL OMNIPAQUE IOHEXOL 300 MG/ML  SOLN  COMPARISON:  CT abdomen pelvis dated 06/03/2013, 12/27/2010  FINDINGS: Areas of linear density within the lung bases differential considerations are scarring versus atelectasis. The heart is enlarged.  The liver, spleen, adrenals, pancreas is unremarkable. Stable benign appearing bilateral renal cysts.  No abdominal or pelvic masses, free fluid, loculated fluid collections, nor adenopathy.  There is no evidence of an abdominal aortic aneurysm. Celiac, SMA, IMA, portal vein are opacified. Atherosclerotic calcifications are appreciated within the aorta and mesenteric vessels.  Gallbladder fossa is unremarkable. The bowel is negative. A moderate amount of stool within the colon.  A very small fat containing umbilical hernia. No inguinal hernia appreciated.  Areas of spondylosis within the thoracic spine. No aggressive appearing osseous lesions.  IMPRESSION: Stable benign appearing Bosniak category 1 cysts within the kidneys.  Moderate amount of fecal retention within the colon  Otherwise no evidence of abdominal or pelvic pathology.  Atelectasis versus scarring within the lung bases.   Electronically Signed   By: Margaree Mackintosh M.D.   On: 07/13/2013 15:43    EKG: Independently reviewed. None  Assessment/Plan  GI bleed, likely lower -Patient will be admitted to medical floor -Fecal occult positive -Patient recently admitted in mid-May for her shortness of breath and found to have pneumonia as well as an acute lower GI bleed.  Patient received transfusion at that admission, GI was  consulted and recommended conservative management.  Coumadin was discontinued at that time.  Patient does have a history of diverticulosis which was on CT in April 2015 -Currently she is hemodynamically stable, hemoglobin is 8.7 -Will continue to monitor hemoglobin and transfuse if hemoglobin drops to 7 -GI has been consulted, Dr. Darrel Hoover -Patient will more than likely be scoped on Monday -Will continue IV fluids -Possible ischemic bowel, however CT abdomen and pelvis with contrast did not show any acute changes, and patient's physical exam is relatively benign -Will continue pain control  Elevated lactic acid -Unknown etiology at this time. -Patient is afebrile with no leukocytosis -UA is negative, patient denies cough or shortness of breath  Acute anemia -Likely secondary to anemia  of chronic disease versus acute bleeding -Currently hemoglobin is 8.7, baseline is approximately 11 -Treatment and plan as above -Continue Ferrous fumarate  Chronic atrial fib -Currently rate controlled -On no anticoagulation due to to recent GI bleeding  History of chronic systolic and diastolic CHF -TTE and March of 2015 shows an EF of 55% -Will continue home medications, coreg, lisinopril, lasix -Will place patient on fluid restriction as well as monitor her daily weights and intake and output  Hypertension -Continue coreg, lisinopril, lasix  GERD -Continue PPI  DVT prophylaxis: SCDs  Code Status: Full  Condition: Guarded  Family Communication: None at bedside Admission, patients condition and plan of care including tests being ordered have been discussed with the patient, who indicates understanding and agrees with the plan and Code Status.  Disposition Plan: Admitted   Time spent: 60 minutes  [place] D.O. [facility] Pager [phone]  If 7PM-7AM, please contact night-coverage [email or web address] Password [facility] 07/13/2013, 5:54 PM

## 2013-07-13 NOTE — Progress Notes (Addendum)
Pt arrived from [facility] for scheduled blood tranfusion of 2 units of PRBCs; pt alert and oriented; daughter accompanied pt.; pt transported in wheelchair and needs 2 person assist to bed; pt states has pain in abdomen; pt states that she has had rectal bleeding in current week; pt unable to go to bathroom without assist; attempted to contact Dr. Sheppard Coil by cell phone to notify her that this facility is not suited for the care of this patient because of bathroom facilities and because we do not have level of staff for total care; contacted [facility] and [place] and spoke with Kennieth Rad, RN; notified Ms. Sharlett Iles that we would not be able to transfuse patient at this center and requested further instructions as to where patient will need to go; nurse verbalized that patient should be transported to [facility] ED for registration and that faxed orders should be taken there; nurse states that someone has spoken with ED staff about patient's arrival; family and patient notified

## 2013-07-13 NOTE — ED Notes (Signed)
Dr Leonides Schanz aware of elevated I stat Lactic

## 2013-07-13 NOTE — ED Notes (Signed)
PA at bedside.

## 2013-07-13 NOTE — ED Notes (Signed)
Daughter [name] ([phone])

## 2013-07-13 NOTE — Progress Notes (Addendum)
Pt transported by wheelchair to [facility] ED for registration; pt received by ED staff and orders from [place] given to [place], RN with communication about nature of patient's visit to [facility]; family at pt's bedside; no complications noted; notified Dr. Zigmund Daniel of need to transport patient to different level of care

## 2013-07-13 NOTE — Progress Notes (Signed)
Patient arrived to the unit soaking wet through diaper 2 pads and bottom sheet and gown. Cleaned patient up and put new pads and changed gown

## 2013-07-13 NOTE — ED Notes (Signed)
Pt comes from [facility]. Pt daughter stated pt was bleeding from her rectum Monday with baseball size clots. Labs for pt were checked Wed and pt has hg of 6.9 sent to ED today for blood transfusion. Pt c/o abdominal pain, and is alert and oriented unable to ambulate.

## 2013-07-13 NOTE — ED Provider Notes (Addendum)
Medical screening examination/treatment/procedure(s) were conducted as a shared visit with non-physician practitioner(s) and myself.  I personally evaluated the patient during the encounter.   EKG Interpretation None      Pt is a 79 y.o. F with h/o diverticulosis and prior GI bleed who was recently admitted to the hospital with a GI bleed who presents emergency Department with bright red blood per rectum it happened on Monday, 4 days ago. Is not clear she's had any bright red blood per rectum since. She had a hemoglobin drawn Wednesday, 2 days ago that was 6.9. She was sent to the emergency department today for possible blood transfusion. Patient complains of abdominal pain but is unable to tell me if she is having bright red blood per rectum.  She was discharged from the hospital with a hemoglobin of 7.7 on 06/26/13. She did not have a colonoscopy at that time as if her bleeding was due to diverticulosis in the setting of being on Coumadin. She has been taken off Coumadin her INR is 0.97. Her repeat hemoglobin today has increased and is now 8.7.  She has no gross blood or melena on exam. She is only trace guaiac positive but does have hemorrhoids. She does have diffuse abdominal tenderness but this has been present since her last admission in May and at that time she had a negative CT of her abdomen and pelvis. Her labs are otherwise unremarkable other than very slightly elevated lactate at 2.64. CT of her abdomen pending. Head CT is unremarkable, I feel patient can be discharged home as long as her vital signs are stable and she is not having active GI bleeding and can follow up with gastroenterology as an outpatient.  [place], DO 07/13/13 1534   CT scan shows diffuse stool but no other abnormality. She is still hemodynamically stable. We'll discuss with family that I feel she can be discharged home with close outpatient followup.  [place], DO 07/13/13 1551  5:33 PM  Pt's lactate is  more elevated after IV fluids. Discussed with Dr. Penelope Coop with [facility] he states he saw the patient in his office on Monday and at that time recommended that she be admitted to the hospital for persistent GI bleed. He recommended she be admitted to the hospitalist service and he will follow her in consult and perform a colonoscopy while in the hospital. Discussed with Dr. Ree Kida with hospitalist service for admission. Updated patient and family.  [place], DO 07/13/13 1734

## 2013-07-13 NOTE — ED Notes (Signed)
MD at bedside. Dr. Ward at bedside. 

## 2013-07-13 NOTE — ED Provider Notes (Signed)
CSN: [id number]     Arrival date & time 07/13/13  1107 History   First MD Initiated Contact with Patient 07/13/13 1111     Chief Complaint  Patient presents with  . Blood transfusion      (Consider location/radiation/quality/duration/timing/severity/associated sxs/prior Treatment) HPI Comments: The patient is a 79 year old female with past medical history of hypertension, diverticulosis, CHF, A. fib, obesity, current resident of [facility] presents emergency room chief complaint of GI bleed abnormal hemoglobin. The patient's daughter reports the patient had a "large" GI bleed on Monday, which is resolved.  Patient reports generalized abdominal pain and nausea without emesis. Patient's hemoglobin was 6.9, 3 days ago per [facility]. She reports she had a GI bleed several weeks ago, which required blood products. The patient reports generalized fatigue and weakness, for several months. The patient does have a history of A-fib and was on warfarin in the past, the patient's daughter states that was stopped with last GI bleed but resumed and states her last dose was on Monday. PCP: Osborne Casco, MD   The history is provided by the patient, medical records and a relative. No language interpreter was used.    Past Medical History  Diagnosis Date  . HTN (hypertension)   . Diverticulosis   . Hemorrhoid   . Osteoarthritis   . Constipation, chronic   . H/O: hysterectomy   . Systolic and diastolic CHF, chronic     a. Echo 07/2011: Technically limited; inferior HK, inferolateral and possibly lateral wall HK, EF 40%, moderate LVE, aortic sclerosis without stenosis, mild MR, mild LAE, question RV dysfunction;   b.  Echo (04/2013): EF 50-55%, normal wall motion, moderate MR, mild to moderate LAE, PASP 37  . Female bladder prolapse     with prolapse uterus, cystocele, s/p TVH/BSO 12/2006  . Obesity (BMI 30-39.9)     wt. 94.1 kg 12/2010  . Moderate mitral regurgitation     a. Mod to Sev by echo  01/2009;  b. mild MR by echo 2013  . Atrial fibrillation 10/13/2011    coumadin Rx  . Iron deficiency anemia    Past Surgical History  Procedure Laterality Date  . Wrist surgery    . Vaginal hysterectomy,cystocele and prolapse  repair  NOV. 2008  . Abdominal hysterectomy     Family History  Problem Relation Age of Onset  . Diabetes Brother   . Hypertension Mother     deceased @ 49  . Leukemia Brother   . Cancer Brother   . Stroke Mother   . Diabetes Father     deceased in his 54's   History  Substance Use Topics  . Smoking status: Never Smoker   . Smokeless tobacco: Current User    Types: Snuff     Comment: Uses Snuff regularly.  Dips several x/day.  A "large can" can last her up to 3 mos.  . Alcohol Use: No   OB History   Grav Para Term Preterm Abortions TAB SAB Ect Mult Living   6 6             Review of Systems  Constitutional: Negative for fever and chills.  Gastrointestinal: Positive for nausea, abdominal pain, diarrhea, blood in stool and anal bleeding. Negative for vomiting and constipation.      Allergies  Levaquin; Penicillins; Tetanus-diphtheria toxoids td; and Latex  Home Medications   Prior to Admission medications   Medication Sig Start Date End Date Taking? Authorizing Provider  Amino Acids-Protein Hydrolys (FEEDING SUPPLEMENT,  PRO-STAT SUGAR FREE 64,) LIQD Take 30 mLs by mouth 2 (two) times daily. 8am and 4:30pm    Historical Provider, MD  bisacodyl (DULCOLAX) 10 MG suppository Place 10 mg rectally daily as needed for moderate constipation.    Historical Provider, MD  carvedilol (COREG) 3.125 MG tablet Take 3.125 mg by mouth 2 (two) times daily with a meal.    Historical Provider, MD  docusate sodium (COLACE) 100 MG capsule Take 100 mg by mouth daily.    Historical Provider, MD  ferrous fumarate (FERRETTS) 325 (106 FE) MG TABS Take 1 tablet by mouth 2 (two) times daily. 8am and 4pm    Historical Provider, MD  magnesium hydroxide (MILK OF MAGNESIA)  400 MG/5ML suspension Take 30 mLs by mouth daily as needed for mild constipation.    Historical Provider, MD  omeprazole (PRILOSEC) 20 MG capsule Take 20 mg by mouth daily.    Historical Provider, MD  promethazine (PHENERGAN) 25 MG tablet Take 25 mg by mouth every 6 (six) hours as needed for nausea or vomiting.    Historical Provider, MD  traMADol (ULTRAM) 50 MG tablet Take one tablet by mouth three times daily around the clock. Hold for sedation 07/04/13   Pricilla Larsson, NP   BP 115/63  Pulse 72  Temp(Src) 97.7 F (36.5 C) (Oral)  Resp 18  SpO2 97% Physical Exam  Nursing note and vitals reviewed. Constitutional: She appears well-developed and well-nourished. No distress.  Obese female  HENT:  Head: Normocephalic and atraumatic.  Mouth/Throat: Oropharynx is clear and moist. No oropharyngeal exudate.  Eyes: EOM are normal. Pupils are equal, round, and reactive to light.  Pale sclera  Neck: Neck supple.  Cardiovascular: Normal rate and regular rhythm.   Murmur heard. Trace bilateral pitting edema  Pulmonary/Chest: Effort normal. No respiratory distress. She has no wheezes.  Abdominal: Soft. There is tenderness. There is no rebound and no guarding.  Abdominal exam limited by patient's body habitus.   Genitourinary: Rectal exam shows external hemorrhoid.  Multiple external hemorrhoids nonthrombosed. Loose stool in rectal vault, no obvious blood. Chaperone present.   Neurological: She is alert.  Skin: Skin is warm and dry. She is not diaphoretic.  Psychiatric: She has a normal mood and affect. Her behavior is normal.    ED Course  Procedures (including critical care time) Labs Review Results for orders placed during the hospital encounter of 07/13/13  CBC WITH DIFFERENTIAL      Result Value Ref Range   WBC 6.2  4.0 - 10.5 K/uL   RBC 2.82 (*) 3.87 - 5.11 MIL/uL   Hemoglobin 8.7 (*) 12.0 - 15.0 g/dL   HCT 27.8 (*) 36.0 - 46.0 %   MCV 98.6  78.0 - 100.0 fL   MCH 30.9  26.0 -  34.0 pg   MCHC 31.3  30.0 - 36.0 g/dL   RDW 16.8 (*) 11.5 - 15.5 %   Platelets 259  150 - 400 K/uL   Neutrophils Relative % 66  43 - 77 %   Neutro Abs 4.1  1.7 - 7.7 K/uL   Lymphocytes Relative 24  12 - 46 %   Lymphs Abs 1.5  0.7 - 4.0 K/uL   Monocytes Relative 7  3 - 12 %   Monocytes Absolute 0.4  0.1 - 1.0 K/uL   Eosinophils Relative 3  0 - 5 %   Eosinophils Absolute 0.2  0.0 - 0.7 K/uL   Basophils Relative 0  0 - 1 %  Basophils Absolute 0.0  0.0 - 0.1 K/uL  COMPREHENSIVE METABOLIC PANEL      Result Value Ref Range   Sodium 140  137 - 147 mEq/L   Potassium 4.2  3.7 - 5.3 mEq/L   Chloride 101  96 - 112 mEq/L   CO2 29  19 - 32 mEq/L   Glucose, Bld 77  70 - 99 mg/dL   BUN 30 (*) 6 - 23 mg/dL   Creatinine, Ser 1.05  0.50 - 1.10 mg/dL   Calcium 8.2 (*) 8.4 - 10.5 mg/dL   Total Protein 6.2  6.0 - 8.3 g/dL   Albumin 3.0 (*) 3.5 - 5.2 g/dL   AST 11  0 - 37 U/L   ALT 10  0 - 35 U/L   Alkaline Phosphatase 88  39 - 117 U/L   Total Bilirubin 0.2 (*) 0.3 - 1.2 mg/dL   GFR calc non Af Amer 50 (*) >90 mL/min   GFR calc Af Amer 58 (*) >90 mL/min  URINALYSIS, ROUTINE W REFLEX MICROSCOPIC      Result Value Ref Range   Color, Urine YELLOW  YELLOW   APPearance CLEAR  CLEAR   Specific Gravity, Urine 1.006  1.005 - 1.030   pH 7.0  5.0 - 8.0   Glucose, UA NEGATIVE  NEGATIVE mg/dL   Hgb urine dipstick NEGATIVE  NEGATIVE   Bilirubin Urine NEGATIVE  NEGATIVE   Ketones, ur NEGATIVE  NEGATIVE mg/dL   Protein, ur NEGATIVE  NEGATIVE mg/dL   Urobilinogen, UA 0.2  0.0 - 1.0 mg/dL   Nitrite NEGATIVE  NEGATIVE   Leukocytes, UA NEGATIVE  NEGATIVE  PROTIME-INR      Result Value Ref Range   Prothrombin Time 12.7  11.6 - 15.2 seconds   INR 0.97  0.00 - 1.49  APTT      Result Value Ref Range   aPTT 25  24 - 37 seconds  POC OCCULT BLOOD, ED      Result Value Ref Range   Fecal Occult Bld POSITIVE (*) NEGATIVE  I-STAT CG4 LACTIC ACID, ED      Result Value Ref Range   Lactic Acid, Venous 2.64 (*)  0.5 - 2.2 mmol/L  TYPE AND SCREEN      Result Value Ref Range   ABO/RH(D) O POS     Antibody Screen PENDING     Sample Expiration 07/16/2013     Ct Head Wo Contrast  06/20/2013   CLINICAL DATA:  Confusion.  Altered mental status.  EXAM: CT HEAD WITHOUT CONTRAST  TECHNIQUE: Contiguous axial images were obtained from the base of the skull through the vertex without intravenous contrast.  COMPARISON:  CT HEAD W/O CM dated 06/03/2013  FINDINGS: No mass lesion, mass effect, midline shift, hydrocephalus, hemorrhage. No acute territorial cortical ischemia/infarct. Atrophy and chronic ischemic white matter disease is present. Mucous retention cyst/polyp is present in the right maxillary sinus.  IMPRESSION: Atrophy and chronic ischemic white matter disease without acute intracranial abnormality.   Electronically Signed   By: Dereck Ligas M.D.   On: 06/20/2013 13:27   Dg Chest Portable 1 View  06/19/2013   CLINICAL DATA:  Shortness of breath  EXAM: PORTABLE CHEST - 1 VIEW  COMPARISON:  06/03/2013  FINDINGS: Patchy lingular opacity, suspicious for pneumonia.  Possible mild patchy right perihilar opacity, equivocal. Eventration of the left hemidiaphragm. No pleural effusion or pneumothorax.  Cardiomegaly.  IMPRESSION: Suspected lingular pneumonia.   Electronically Signed   By: Julian Hy  M.D.   On: 06/19/2013 19:21    EKG Interpretation None      MDM   Final diagnoses:  GI bleed  Elevated lactic acid level   79 year old female, full code, history of GI bleed with positive Hemoccult + today no bright red blood per rectum. Outside records show hemoglobin of 6.9 on 07/12/2013.  Per previous admission last GI bleed due to ? diverticular bleed or ischemic bowel and recommended followup colonoscopy as an outpatient. IV placement was difficult on this patient an IV team was called. IV access was obtained labs finally drawn, shortly after the labs were drawn the IV site was loss. IV team paged and in  route for to obtain IV access. Hemoglobin 8.7 today, will hold off on blood transfusion at this time. Previous hemoglobin 7.7 on 06/26/2013. Dr. Leonides Schanz also evaluated the patient urine this encounter. Reevaluation patient resting comfortably in room, discussed results with the patient. Reports daughter her left the ED for a brief period. Dr. Leonides Schanz to disposition the patient.  Awaiting CT results.  Meds given in ED:  Medications  sodium chloride 0.9 % bolus 1,000 mL (1,000 mLs Intravenous New Bag/Given 07/13/13 1435)  iohexol (OMNIPAQUE) 300 MG/ML solution 50 mL (50 mLs Oral Contrast Given 07/13/13 1359)  iohexol (OMNIPAQUE) 300 MG/ML solution 100 mL (100 mLs Intravenous Contrast Given 07/13/13 1512)    New Prescriptions   No medications on file        Lorrine Kin, PA-C 07/16/13 1636

## 2013-07-13 NOTE — ED Notes (Signed)
Ultrasound guided IV attempts unsuccessful. IV team contacted.

## 2013-07-14 DIAGNOSIS — D509 Iron deficiency anemia, unspecified: Secondary | ICD-10-CM

## 2013-07-14 LAB — CBC
HCT: 23.7 % — ABNORMAL LOW (ref 36.0–46.0)
HEMOGLOBIN: 7.3 g/dL — AB (ref 12.0–15.0)
MCH: 30.7 pg (ref 26.0–34.0)
MCHC: 30.8 g/dL (ref 30.0–36.0)
MCV: 99.6 fL (ref 78.0–100.0)
Platelets: 237 10*3/uL (ref 150–400)
RBC: 2.38 MIL/uL — AB (ref 3.87–5.11)
RDW: 17.2 % — AB (ref 11.5–15.5)
WBC: 4.9 10*3/uL (ref 4.0–10.5)

## 2013-07-14 LAB — COMPREHENSIVE METABOLIC PANEL
ALBUMIN: 2.4 g/dL — AB (ref 3.5–5.2)
ALK PHOS: 79 U/L (ref 39–117)
ALT: 9 U/L (ref 0–35)
AST: 8 U/L (ref 0–37)
BILIRUBIN TOTAL: 0.3 mg/dL (ref 0.3–1.2)
BUN: 23 mg/dL (ref 6–23)
CHLORIDE: 106 meq/L (ref 96–112)
CO2: 27 meq/L (ref 19–32)
CREATININE: 0.97 mg/dL (ref 0.50–1.10)
Calcium: 7.7 mg/dL — ABNORMAL LOW (ref 8.4–10.5)
GFR calc Af Amer: 64 mL/min — ABNORMAL LOW (ref >90)
GFR, EST NON AFRICAN AMERICAN: 55 mL/min — AB (ref >90)
GLUCOSE: 83 mg/dL (ref 70–99)
POTASSIUM: 4.1 meq/L (ref 3.7–5.3)
Sodium: 143 mEq/L (ref 137–147)
Total Protein: 5 g/dL — ABNORMAL LOW (ref 6.0–8.3)

## 2013-07-14 LAB — LACTIC ACID, PLASMA: LACTIC ACID, VENOUS: 0.8 mmol/L (ref 0.5–2.2)

## 2013-07-14 LAB — PREPARE RBC (CROSSMATCH)

## 2013-07-14 MED ORDER — FUROSEMIDE 10 MG/ML IJ SOLN
20.0000 mg | Freq: Once | INTRAMUSCULAR | Status: AC
Start: 2013-07-14 — End: 2013-07-14
  Administered 2013-07-14: 20 mg via INTRAVENOUS
  Filled 2013-07-14: qty 2

## 2013-07-14 NOTE — Progress Notes (Signed)
[facility]                                                                              Patient Demographics  [name], is a 79 y.o. female, DOB - Oct 08, 1934, MWU:[id number]  Admit date - 07/13/2013   Admitting Physician Cristal Ford, DO  Outpatient Primary MD for the patient is Osborne Casco, MD  LOS - 1   Chief Complaint  Patient presents with  . Blood transfusion       HPI: [name] is a 79 y.o. female with a history of systolic and diastolic CHF, recent GI bleed with hospitalization for pneumonia, hypertension, that presents to the emergency department from her nursing home. Unfortunately no family was present at time of admission, information in history and physical were obtained from the emergency department attending. Patient resides at [place]. Patient was seen by Dr. Penelope Coop on Monday, 07/09/2013 for followup appointment. At that time patient was noted to have GI bleeding which was characterized as large, and was told to go to the emergency department. Patient was also noted to have a hemoglobin of 6.9 at [facility] approximately 3 days ago. Patient however did not present to the emergency department until 4 days later. At that time, her hemoglobin was noted to be 8.7. Patient has received transfusions at her last hospitalization. Patient also has a history of atrial fibrillation however is no longer on Coumadin as this was held her last hospitalization. Patient only complained of rib pain. She could not give any information regarding her GI bleed. She states she has noted some blood in her pants. Patient could not quantify the amount. Patient denied any constipation or diarrhea at this time.  Assessment & Plan   GI bleed, likely lower  -Fecal occult positive  -Patient recently admitted in mid-May for her shortness of breath and found to have pneumonia as well as an acute lower GI bleed. Patient received transfusion at that admission, GI was  consulted and recommended conservative management. Coumadin was discontinued at that time. Patient does have a history of diverticulosis which was on CT in April 2015  -hemoglobin 7.3, will transfuse 2uPRBCs with lasix between units -GI has been consulted, Dr. Darrel Hoover, pending further recommendations -Possible colonoscopy on Monday -Possible ischemic bowel, however CT abdomen and pelvis with contrast did not show any acute changes, and patient's physical exam is relatively benign  -Continue pain control   Elevated lactic acid  -Resolved, Unknown etiology at this time.  -Was 3.73 at admission, currently 0.8 -Patient is afebrile with no leukocytosis  -UA is negative, patient denies cough or shortness of breath   Acute anemia  -Likely secondary to anemia of chronic disease versus acute bleeding  -Currently hemoglobin is 7.3, baseline is approximately 11  -Treatment and plan as above  -Continue Ferrous fumarate   Chronic atrial fib  -Currently rate controlled  -On no anticoagulation due to to recent GI bleeding   History of chronic systolic and diastolic CHF  -TTE and March of 2015 shows an EF of 55%  -Continue coreg, lisinopril, lasix  -Continue fluid restriction as well as monitor her daily weights and intake and output   Hypertension  -  Continue coreg, lisinopril, lasix   GERD  -Continue PPI  Code Status: Full  Family Communication: None at bedside  Disposition Plan: Admitted  Time Spent in minutes   30 minutes  Procedures  None  Consults   Gastroenterology  DVT Prophylaxis  SCDs  Lab Results  Component Value Date   PLT 237 07/14/2013    Medications  Scheduled Meds: . carvedilol  3.125 mg Oral BID WC  . feeding supplement (PRO-STAT SUGAR FREE 64)  30 mL Oral BID  . ferrous fumarate  1 tablet Oral BID  . furosemide  20 mg Intravenous Once  . furosemide  60 mg Oral q morning - 10a  . lisinopril  2.5 mg Oral q morning - 10a  . pantoprazole  40 mg Oral Daily  .  predniSONE  40 mg Oral Q breakfast  . traMADol  50 mg Oral TID   Continuous Infusions: . sodium chloride 75 mL/hr at 07/13/13 2046   PRN Meds:.acetaminophen, acetaminophen, bisacodyl, HYDROcodone-acetaminophen, magnesium hydroxide, morphine injection, promethazine, senna, sodium phosphate  Antibiotics   Anti-infectives   None      Subjective:   [name] seen and examined today.  Patient still complains of abdominal pain. States this has been chronic for her. She states the pain is the same and has not changed. Denies any nausea or vomiting. Denies any shortness of breath or chest pain at this time.  Objective:   Filed Vitals:   07/13/13 1908 07/13/13 2125 07/13/13 2200 07/14/13 0600  BP: 123/61  113/70 101/61  Pulse: 69  70 75  Temp: 98.4 F (36.9 C)  98.9 F (37.2 C) 98.4 F (36.9 C)  TempSrc: Oral  Oral Oral  Resp: 19  18 18   Weight:  110.36 kg (243 lb 4.8 oz)    SpO2: 98%  98% 100%    Wt Readings from Last 3 Encounters:  07/13/13 110.36 kg (243 lb 4.8 oz)  07/11/13 105.688 kg (233 lb)  06/26/13 105.3 kg (232 lb 2.3 oz)     Intake/Output Summary (Last 24 hours) at 07/14/13 0742 Last data filed at 07/13/13 1348  Gross per 24 hour  Intake      0 ml  Output    400 ml  Net   -400 ml    Exam General: Well developed, well nourished, NAD, appears stated age  HEENT: NCAT, PERRLA, EOMI, Anicteic Sclera, mucous membranes moist.  Neck: Supple, no JVD, no masses  Cardiovascular: S1 S2 auscultated, 2/6 SEM, RRR  Respiratory: Clear to auscultation bilaterally with equal chest rise  Abdomen: Soft, obese, diffusely tender, nondistended, + bowel sounds  GU/Rectal: External hemorrhoid  Extremities: warm dry without cyanosis clubbing. Trace LE edema B/L, healing wound of plantar surface of right foot  Neuro: AAOx3, cranial nerves grossly intact.  Skin: Without rashes exudates or nodules  Psych: Normal affect and demeanor with intact judgement and insight  Data Review     Micro Results No results found for this or any previous visit (from the past 240 hour(s)).  Radiology Reports Ct Head Wo Contrast  06/20/2013   CLINICAL DATA:  Confusion.  Altered mental status.  EXAM: CT HEAD WITHOUT CONTRAST  TECHNIQUE: Contiguous axial images were obtained from the base of the skull through the vertex without intravenous contrast.  COMPARISON:  CT HEAD W/O CM dated 06/03/2013  FINDINGS: No mass lesion, mass effect, midline shift, hydrocephalus, hemorrhage. No acute territorial cortical ischemia/infarct. Atrophy and chronic ischemic white matter disease is present. Mucous retention cyst/polyp  is present in the right maxillary sinus.  IMPRESSION: Atrophy and chronic ischemic white matter disease without acute intracranial abnormality.   Electronically Signed   By: Dereck Ligas M.D.   On: 06/20/2013 13:27   Ct Abdomen Pelvis W Contrast  07/13/2013   CLINICAL DATA:  Abdominal pain  EXAM: CT ABDOMEN AND PELVIS WITH CONTRAST  TECHNIQUE: Multidetector CT imaging of the abdomen and pelvis was performed using the standard protocol following bolus administration of intravenous contrast.  CONTRAST:  126mL OMNIPAQUE IOHEXOL 300 MG/ML  SOLN  COMPARISON:  CT abdomen pelvis dated 06/03/2013, 12/27/2010  FINDINGS: Areas of linear density within the lung bases differential considerations are scarring versus atelectasis. The heart is enlarged.  The liver, spleen, adrenals, pancreas is unremarkable. Stable benign appearing bilateral renal cysts.  No abdominal or pelvic masses, free fluid, loculated fluid collections, nor adenopathy.  There is no evidence of an abdominal aortic aneurysm. Celiac, SMA, IMA, portal vein are opacified. Atherosclerotic calcifications are appreciated within the aorta and mesenteric vessels.  Gallbladder fossa is unremarkable. The bowel is negative. A moderate amount of stool within the colon.  A very small fat containing umbilical hernia. No inguinal hernia appreciated.   Areas of spondylosis within the thoracic spine. No aggressive appearing osseous lesions.  IMPRESSION: Stable benign appearing Bosniak category 1 cysts within the kidneys.  Moderate amount of fecal retention within the colon  Otherwise no evidence of abdominal or pelvic pathology.  Atelectasis versus scarring within the lung bases.   Electronically Signed   By: Margaree Mackintosh M.D.   On: 07/13/2013 15:43   Dg Chest Portable 1 View  06/19/2013   CLINICAL DATA:  Shortness of breath  EXAM: PORTABLE CHEST - 1 VIEW  COMPARISON:  06/03/2013  FINDINGS: Patchy lingular opacity, suspicious for pneumonia.  Possible mild patchy right perihilar opacity, equivocal. Eventration of the left hemidiaphragm. No pleural effusion or pneumothorax.  Cardiomegaly.  IMPRESSION: Suspected lingular pneumonia.   Electronically Signed   By: Julian Hy M.D.   On: 06/19/2013 19:21    CBC  Recent Labs Lab 07/13/13 1325 07/14/13 0541  WBC 6.2 4.9  HGB 8.7* 7.3*  HCT 27.8* 23.7*  PLT 259 237  MCV 98.6 99.6  MCH 30.9 30.7  MCHC 31.3 30.8  RDW 16.8* 17.2*  LYMPHSABS 1.5  --   MONOABS 0.4  --   EOSABS 0.2  --   BASOSABS 0.0  --     Chemistries   Recent Labs Lab 07/11/13 1303 07/13/13 1325 07/14/13 0541  NA 136 140 143  K 3.3* 4.2 4.1  CL 100 101 106  CO2 30 29 27   GLUCOSE 93 77 83  BUN 31* 30* 23  CREATININE 1.1 1.05 0.97  CALCIUM 7.6* 8.2* 7.7*  AST  --  11 8  ALT  --  10 9  ALKPHOS  --  88 79  BILITOT  --  0.2* 0.3   ------------------------------------------------------------------------------------------------------------------ CrCl is unknown because both a height and weight (above a minimum accepted value) are required for this calculation. ------------------------------------------------------------------------------------------------------------------ No results found for this basename: HGBA1C,  in the last 72  hours ------------------------------------------------------------------------------------------------------------------ No results found for this basename: CHOL, HDL, LDLCALC, TRIG, CHOLHDL, LDLDIRECT,  in the last 72 hours ------------------------------------------------------------------------------------------------------------------ No results found for this basename: TSH, T4TOTAL, FREET3, T3FREE, THYROIDAB,  in the last 72 hours ------------------------------------------------------------------------------------------------------------------ No results found for this basename: VITAMINB12, FOLATE, FERRITIN, TIBC, IRON, RETICCTPCT,  in the last 72 hours  Coagulation profile  Recent Labs Lab  07/13/13 1326  INR 0.97    No results found for this basename: DDIMER,  in the last 72 hours  Cardiac Enzymes No results found for this basename: CK, CKMB, TROPONINI, MYOGLOBIN,  in the last 168 hours ------------------------------------------------------------------------------------------------------------------ No components found with this basename: POCBNP,     Delmon Andrada D.O. on 07/14/2013 at 7:42 AM  Between 7am to 7pm - Pager - [phone]  After 7pm go to [email or web address] - password [email or web address]  And look for the night coverage person covering for me after hours  [facility] Office  [phone]

## 2013-07-14 NOTE — Consult Note (Signed)
Subjective:   HPI  The patient is a 79 year old female with multiple medical problems who was admitted to the hospital because of lower GI bleeding and anemia. She was recently discharged from the hospital with gastrointestinal bleeding which was felt secondary to diverticulosis. She had been on Coumadin and her INR was elevated. The Coumadin was discontinued. She went back to the nursing home where she resides and was doing reasonably well but then started having bleeding last Sunday. I saw her in the office last Monday with complaints of GI bleeding and instructed her to go directly to the emergency room to be further evaluated. She was taken back to the nursing home, and never did go to the emergency room until Friday afternoon.  Review of Systems Denies chest pain or shortness of breath  Past Medical History  Diagnosis Date  . HTN (hypertension)   . Diverticulosis   . Hemorrhoid   . Osteoarthritis   . Constipation, chronic   . H/O: hysterectomy   . Systolic and diastolic CHF, chronic     a. Echo 07/2011: Technically limited; inferior HK, inferolateral and possibly lateral wall HK, EF 40%, moderate LVE, aortic sclerosis without stenosis, mild MR, mild LAE, question RV dysfunction;   b.  Echo (04/2013): EF 50-55%, normal wall motion, moderate MR, mild to moderate LAE, PASP 37  . Female bladder prolapse     with prolapse uterus, cystocele, s/p TVH/BSO 12/2006  . Obesity (BMI 30-39.9)     wt. 94.1 kg 12/2010  . Moderate mitral regurgitation     a. Mod to Sev by echo 01/2009;  b. mild MR by echo 2013  . Atrial fibrillation 10/13/2011    coumadin Rx  . Iron deficiency anemia    Past Surgical History  Procedure Laterality Date  . Wrist surgery    . Vaginal hysterectomy,cystocele and prolapse  repair  NOV. 2008  . Abdominal hysterectomy     History   Social History  . Marital Status: Single    Spouse Name: N/A    Number of Children: N/A  . Years of Education: N/A   Occupational  History  . retired Gaffer    Social History Main Topics  . Smoking status: Never Smoker   . Smokeless tobacco: Current User    Types: Snuff     Comment: Uses Snuff regularly.  Dips several x/day.  A "large can" can last her up to 3 mos.  . Alcohol Use: No  . Drug Use: No  . Sexual Activity: Not Currently   Other Topics Concern  . Not on file   Social History Narrative   Lives in [place] by herself.  Her dtr lives near by and prepares all of her meals and leaves them for the pt to heat up in the microwave.  Dtr pays close attn to salt in meals.  Pt is sedentary.  Most activity is moving from couch to bed.  She ambulates w/ a walker.  Wt is up from 217 in January to 244 now.   family history includes Cancer in her brother; Diabetes in her brother and father; Hypertension in her mother; Leukemia in her brother; Stroke in her mother. Current facility-administered medications:0.9 %  sodium chloride infusion, , Intravenous, Continuous, Maryann Mikhail, DO, Last Rate: 75 mL/hr at 07/13/13 2046;  acetaminophen (TYLENOL) suppository 650 mg, 650 mg, Rectal, Q6H PRN, Maryann Mikhail, DO;  acetaminophen (TYLENOL) tablet 650 mg, 650 mg, Oral, Q6H PRN, Maryann Mikhail, DO;  bisacodyl (DULCOLAX) suppository 10  mg, 10 mg, Rectal, Daily PRN, Maryann Mikhail, DO carvedilol (COREG) tablet 3.125 mg, 3.125 mg, Oral, BID WC, Maryann Mikhail, DO;  feeding supplement (PRO-STAT SUGAR FREE 64) liquid 30 mL, 30 mL, Oral, BID, Maryann Mikhail, DO, 30 mL at 07/14/13 1040;  ferrous fumarate (HEMOCYTE - 106 mg FE) tablet 106 mg of iron, 1 tablet, Oral, BID, Maryann Mikhail, DO, 106 mg of iron at 07/14/13 1040;  furosemide (LASIX) tablet 60 mg, 60 mg, Oral, q morning - 10a, Maryann Mikhail, DO, 60 mg at 07/14/13 1040 HYDROcodone-acetaminophen (NORCO/VICODIN) 5-325 MG per tablet 1 tablet, 1 tablet, Oral, Q8H PRN, Maryann Mikhail, DO, 1 tablet at 07/14/13 [phone];  lisinopril (PRINIVIL,ZESTRIL) tablet 2.5 mg, 2.5 mg, Oral, q  morning - 10a, Maryann Mikhail, DO;  magnesium hydroxide (MILK OF MAGNESIA) suspension 30 mL, 30 mL, Oral, Daily PRN, Maryann Mikhail, DO;  morphine 2 MG/ML injection 2 mg, 2 mg, Intravenous, Q4H PRN, Maryann Mikhail, DO, 2 mg at 07/14/13 0911 pantoprazole (PROTONIX) EC tablet 40 mg, 40 mg, Oral, Daily, Maryann Mikhail, DO, 40 mg at 07/14/13 1040;  predniSONE (DELTASONE) tablet 40 mg, 40 mg, Oral, Q breakfast, Maryann Mikhail, DO, 40 mg at 07/14/13 1700;  promethazine (PHENERGAN) tablet 25 mg, 25 mg, Oral, Q6H PRN, Maryann Mikhail, DO;  senna (SENOKOT) tablet 8.6 mg, 1 tablet, Oral, Daily PRN, Maryann Mikhail, DO sodium phosphate (FLEET) 7-19 GM/118ML enema 1 enema, 1 enema, Rectal, Daily PRN, Maryann Mikhail, DO;  traMADol (ULTRAM) tablet 50 mg, 50 mg, Oral, TID, Maryann Mikhail, DO, 50 mg at 07/14/13 1044 Allergies  Allergen Reactions  . Levaquin [Levofloxacin] Other (See Comments)    Per MAR  . Penicillins Other (See Comments)    Per MAR  . Tetanus-Diphtheria Toxoids Td Other (See Comments)    Per MAR  . Latex Hives and Rash     Objective:     BP 104/61  Pulse 83  Temp(Src) 98.7 F (37.1 C) (Oral)  Resp 20  Ht 5\' 1"  (1.549 m)  Wt 110.36 kg (243 lb 4.8 oz)  BMI 45.99 kg/m2  SpO2 100%  She is in no distress  Nonicteric  Heart regular rhythm  Lungs clear  Abdomen: Soft and nontender  Laboratory No components found with this basename: d1      Assessment:     Recurrent GI bleeding. The last time she was in the hospital she was treated conservatively. Her Coumadin was stopped. She has now had further problems with lower GI bleeding.      Plan:     At this point I will plan to do a colonoscopy on her Monday to further evaluate.

## 2013-07-14 NOTE — Progress Notes (Signed)
This shif,t in agreement with previous RN's assessment. Will continue to monitor pt and provide appropriate interventions as needed.

## 2013-07-15 DIAGNOSIS — G8929 Other chronic pain: Secondary | ICD-10-CM

## 2013-07-15 DIAGNOSIS — Z8719 Personal history of other diseases of the digestive system: Secondary | ICD-10-CM

## 2013-07-15 LAB — TYPE AND SCREEN
ABO/RH(D): O POS
Antibody Screen: NEGATIVE
Unit division: 0
Unit division: 0

## 2013-07-15 LAB — CBC
HCT: 29.4 % — ABNORMAL LOW (ref 36.0–46.0)
Hemoglobin: 9.5 g/dL — ABNORMAL LOW (ref 12.0–15.0)
MCH: 30.3 pg (ref 26.0–34.0)
MCHC: 32.3 g/dL (ref 30.0–36.0)
MCV: 93.6 fL (ref 78.0–100.0)
PLATELETS: 244 10*3/uL (ref 150–400)
RBC: 3.14 MIL/uL — AB (ref 3.87–5.11)
RDW: 17.4 % — ABNORMAL HIGH (ref 11.5–15.5)
WBC: 5.3 10*3/uL (ref 4.0–10.5)

## 2013-07-15 LAB — BASIC METABOLIC PANEL
BUN: 24 mg/dL — ABNORMAL HIGH (ref 6–23)
CO2: 28 meq/L (ref 19–32)
CREATININE: 1.13 mg/dL — AB (ref 0.50–1.10)
Calcium: 7.9 mg/dL — ABNORMAL LOW (ref 8.4–10.5)
Chloride: 98 mEq/L (ref 96–112)
GFR calc Af Amer: 53 mL/min — ABNORMAL LOW (ref >90)
GFR, EST NON AFRICAN AMERICAN: 46 mL/min — AB (ref >90)
Glucose, Bld: 94 mg/dL (ref 70–99)
Potassium: 3.9 mEq/L (ref 3.7–5.3)
Sodium: 136 mEq/L — ABNORMAL LOW (ref 137–147)

## 2013-07-15 MED ORDER — SODIUM CHLORIDE 0.9 % IV SOLN: INTRAVENOUS | Status: DC

## 2013-07-15 MED ORDER — PEG 3350-KCL-NA BICARB-NACL 420 G PO SOLR
4000.0000 mL | Freq: Once | ORAL | Status: AC
  Administered 2013-07-15: 4000 mL via ORAL

## 2013-07-15 NOTE — Progress Notes (Signed)
[facility]                                                                              Patient Demographics  [name], is a 79 y.o. female, DOB - 1934-07-11, JAS:[id number]  Admit date - 07/13/2013   Admitting Physician Cristal Ford, DO  Outpatient Primary MD for the patient is Osborne Casco, MD  LOS - 2   Chief Complaint  Patient presents with  . Blood transfusion       HPI: [name] is a 79 y.o. female with a history of systolic and diastolic CHF, recent GI bleed with hospitalization for pneumonia, hypertension, that presents to the emergency department from her nursing home. Unfortunately no family was present at time of admission, information in history and physical were obtained from the emergency department attending. Patient resides at [facility]. Patient was seen by Dr. Penelope Coop on Monday, 07/09/2013 for followup appointment. At that time patient was noted to have GI bleeding which was characterized as large, and was told to go to the emergency department. Patient was also noted to have a hemoglobin of 6.9 at [facility] approximately 3 days ago. Patient however did not present to the emergency department until 4 days later. At that time, her hemoglobin was noted to be 8.7. Patient has received transfusions at her last hospitalization. Patient also has a history of atrial fibrillation however is no longer on Coumadin as this was held her last hospitalization. Patient only complained of rib pain. She could not give any information regarding her GI bleed. She states she has noted some blood in her pants. Patient could not quantify the amount. Patient denied any constipation or diarrhea at this time.  Assessment & Plan   GI bleed, likely lower  -Fecal occult positive  -Patient recently admitted in mid-May for her shortness of breath and found to have pneumonia as well as an acute lower GI bleed. Patient received transfusion at that admission, GI  was consulted and recommended conservative management. Coumadin was discontinued at that time. Patient does have a history of diverticulosis which was on CT in April 2015  -Patient was transfused 2u PRBCS on 07/14/2013, hemoglobin currently 9.5 -GI has been consulted, Dr. Darrel Hoover, and colonoscopy planned for Monday, 07/16/2013 -Possible ischemic bowel, however CT abdomen and pelvis with contrast did not show any acute changes, and patient's physical exam is relatively benign  -Continue pain control   Elevated lactic acid  -Resolved, Unknown etiology at this time.  -Was 3.73 at admission and trended downward to 0.8 -Patient is afebrile with no leukocytosis  -UA is negative, patient denies cough or shortness of breath   Acute anemia  -Likely secondary to anemia of chronic disease versus acute bleeding  -Currently hemoglobin is 9.5, baseline is approximately 11  -Treatment and plan as above  -Continue Ferrous fumarate   Chronic atrial fib  -Currently rate controlled  -On no anticoagulation due to to recent GI bleeding   History of chronic systolic and diastolic CHF  -TTE and March of 2015 shows an EF of 55%  -Continue coreg, lisinopril, lasix  -Continue fluid restriction as well as monitor her daily weights and intake and  output   Hypertension  -Continue coreg, lisinopril, lasix   GERD  -Continue PPI  Code Status: Full  Family Communication: None at bedside  Disposition Plan: Admitted  Time Spent in minutes   25 minutes  Procedures  None  Consults   Gastroenterology  DVT Prophylaxis  SCDs  Lab Results  Component Value Date   PLT 244 07/15/2013    Medications  Scheduled Meds: . carvedilol  3.125 mg Oral BID WC  . feeding supplement (PRO-STAT SUGAR FREE 64)  30 mL Oral BID  . ferrous fumarate  1 tablet Oral BID  . furosemide  60 mg Oral q morning - 10a  . lisinopril  2.5 mg Oral q morning - 10a  . pantoprazole  40 mg Oral Daily  . predniSONE  40 mg Oral Q breakfast   . traMADol  50 mg Oral TID   Continuous Infusions:   PRN Meds:.acetaminophen, acetaminophen, bisacodyl, HYDROcodone-acetaminophen, magnesium hydroxide, morphine injection, promethazine, senna, sodium phosphate  Antibiotics   Anti-infectives   None      Subjective:   [name] seen and examined today.  Patient still complains of abdominal pain which she states is no different. Patient wishes to eat as she currently is on a clear liquid diet for colonoscopy. Denies any nausea or vomiting. Denies any shortness of breath or chest pain at this time.  Objective:   Filed Vitals:   07/14/13 1543 07/14/13 1813 07/14/13 2057 07/15/13 0518  BP: 124/80 113/68 127/70 131/79  Pulse: 77 76 71 68  Temp: 99.1 F (37.3 C)  97.6 F (36.4 C) 98.1 F (36.7 C)  TempSrc: Axillary  Oral Oral  Resp: 20  20 20   Height:      Weight:    115.577 kg (254 lb 12.8 oz)  SpO2: 100%  98% 98%    Wt Readings from Last 3 Encounters:  07/15/13 115.577 kg (254 lb 12.8 oz)  07/11/13 105.688 kg (233 lb)  06/26/13 105.3 kg (232 lb 2.3 oz)     Intake/Output Summary (Last 24 hours) at 07/15/13 0802 Last data filed at 07/14/13 1800  Gross per 24 hour  Intake 2923.33 ml  Output      0 ml  Net 2923.33 ml    Exam General: Well developed, well nourished, NAD, appears stated age  30: NCAT, mucous membranes moist.  Neck: Supple, no JVD, no masses  Cardiovascular: S1 S2 auscultated, 2/6 SEM, RRR  Respiratory: Clear to auscultation bilaterally with equal chest rise  Abdomen: Soft, obese, diffusely tender, nondistended, + bowel sounds  Extremities: warm dry without cyanosis clubbing or edema Neuro: AAOx3, No focal deficits Skin: Without rashes exudates or nodules  Psych: Normal affect and demeanor   Data Review   Micro Results No results found for this or any previous visit (from the past 240 hour(s)).  Radiology Reports Ct Head Wo Contrast  06/20/2013   CLINICAL DATA:  Confusion.  Altered mental  status.  EXAM: CT HEAD WITHOUT CONTRAST  TECHNIQUE: Contiguous axial images were obtained from the base of the skull through the vertex without intravenous contrast.  COMPARISON:  CT HEAD W/O CM dated 06/03/2013  FINDINGS: No mass lesion, mass effect, midline shift, hydrocephalus, hemorrhage. No acute territorial cortical ischemia/infarct. Atrophy and chronic ischemic white matter disease is present. Mucous retention cyst/polyp is present in the right maxillary sinus.  IMPRESSION: Atrophy and chronic ischemic white matter disease without acute intracranial abnormality.   Electronically Signed   By: Arvilla Market.D.  On: 06/20/2013 13:27   Ct Abdomen Pelvis W Contrast  07/13/2013   CLINICAL DATA:  Abdominal pain  EXAM: CT ABDOMEN AND PELVIS WITH CONTRAST  TECHNIQUE: Multidetector CT imaging of the abdomen and pelvis was performed using the standard protocol following bolus administration of intravenous contrast.  CONTRAST:  18mL OMNIPAQUE IOHEXOL 300 MG/ML  SOLN  COMPARISON:  CT abdomen pelvis dated 06/03/2013, 12/27/2010  FINDINGS: Areas of linear density within the lung bases differential considerations are scarring versus atelectasis. The heart is enlarged.  The liver, spleen, adrenals, pancreas is unremarkable. Stable benign appearing bilateral renal cysts.  No abdominal or pelvic masses, free fluid, loculated fluid collections, nor adenopathy.  There is no evidence of an abdominal aortic aneurysm. Celiac, SMA, IMA, portal vein are opacified. Atherosclerotic calcifications are appreciated within the aorta and mesenteric vessels.  Gallbladder fossa is unremarkable. The bowel is negative. A moderate amount of stool within the colon.  A very small fat containing umbilical hernia. No inguinal hernia appreciated.  Areas of spondylosis within the thoracic spine. No aggressive appearing osseous lesions.  IMPRESSION: Stable benign appearing Bosniak category 1 cysts within the kidneys.  Moderate amount of fecal  retention within the colon  Otherwise no evidence of abdominal or pelvic pathology.  Atelectasis versus scarring within the lung bases.   Electronically Signed   By: Margaree Mackintosh M.D.   On: 07/13/2013 15:43   Dg Chest Portable 1 View  06/19/2013   CLINICAL DATA:  Shortness of breath  EXAM: PORTABLE CHEST - 1 VIEW  COMPARISON:  06/03/2013  FINDINGS: Patchy lingular opacity, suspicious for pneumonia.  Possible mild patchy right perihilar opacity, equivocal. Eventration of the left hemidiaphragm. No pleural effusion or pneumothorax.  Cardiomegaly.  IMPRESSION: Suspected lingular pneumonia.   Electronically Signed   By: Julian Hy M.D.   On: 06/19/2013 19:21    CBC  Recent Labs Lab 07/13/13 1325 07/14/13 0541 07/15/13 0540  WBC 6.2 4.9 5.3  HGB 8.7* 7.3* 9.5*  HCT 27.8* 23.7* 29.4*  PLT 259 237 244  MCV 98.6 99.6 93.6  MCH 30.9 30.7 30.3  MCHC 31.3 30.8 32.3  RDW 16.8* 17.2* 17.4*  LYMPHSABS 1.5  --   --   MONOABS 0.4  --   --   EOSABS 0.2  --   --   BASOSABS 0.0  --   --     Chemistries   Recent Labs Lab 07/11/13 1303 07/13/13 1325 07/14/13 0541 07/15/13 0540  NA 136 140 143 136*  K 3.3* 4.2 4.1 3.9  CL 100 101 106 98  CO2 30 29 27 28   GLUCOSE 93 77 83 94  BUN 31* 30* 23 24*  CREATININE 1.1 1.05 0.97 1.13*  CALCIUM 7.6* 8.2* 7.7* 7.9*  AST  --  11 8  --   ALT  --  10 9  --   ALKPHOS  --  88 79  --   BILITOT  --  0.2* 0.3  --    ------------------------------------------------------------------------------------------------------------------ estimated creatinine clearance is 50.1 ml/min (by C-G formula based on Cr of 1.13). ------------------------------------------------------------------------------------------------------------------ No results found for this basename: HGBA1C,  in the last 72 hours ------------------------------------------------------------------------------------------------------------------ No results found for this basename: CHOL,  HDL, LDLCALC, TRIG, CHOLHDL, LDLDIRECT,  in the last 72 hours ------------------------------------------------------------------------------------------------------------------ No results found for this basename: TSH, T4TOTAL, FREET3, T3FREE, THYROIDAB,  in the last 72 hours ------------------------------------------------------------------------------------------------------------------ No results found for this basename: VITAMINB12, FOLATE, FERRITIN, TIBC, IRON, RETICCTPCT,  in the last 72 hours  Coagulation profile  Recent Labs Lab 07/13/13 1326  INR 0.97    No results found for this basename: DDIMER,  in the last 72 hours  Cardiac Enzymes No results found for this basename: CK, CKMB, TROPONINI, MYOGLOBIN,  in the last 168 hours ------------------------------------------------------------------------------------------------------------------ No components found with this basename: POCBNP,     Shalena Ezzell D.O. on 07/15/2013 at 8:02 AM  Between 7am to 7pm - Pager - [phone]  After 7pm go to [email or web address] - password [email or web address]  And look for the night coverage person covering for me after hours  [facility] Office  [phone]

## 2013-07-15 NOTE — Progress Notes (Signed)
[facility] Progress Note  Subjective: Patient states she is feeling better.started on clear liquids today  Objective: Vital signs in last 24 hours: Temp:  [97.4 F (36.3 C)-99.1 F (37.3 C)] 98.1 F (36.7 C) (06/07 0518) Pulse Rate:  [68-85] 68 (06/07 0518) Resp:  [20] 20 (06/07 0518) BP: (104-131)/(56-80) 131/79 mmHg (06/07 0518) SpO2:  [98 %-100 %] 98 % (06/07 0518) Weight:  [115.577 kg (254 lb 12.8 oz)] 115.577 kg (254 lb 12.8 oz) (06/07 0518) Weight change: 5.216 kg (11 lb 8 oz)   PE:  She is in no distress  Abdomen soft nontender  Lab Results: Results for orders placed during the hospital encounter of 07/13/13 (from the past 24 hour(s))  CBC     Status: Abnormal   Collection Time    07/15/13  5:40 AM      Result Value Ref Range   WBC 5.3  4.0 - 10.5 K/uL   RBC 3.14 (*) 3.87 - 5.11 MIL/uL   Hemoglobin 9.5 (*) 12.0 - 15.0 g/dL   HCT 29.4 (*) 36.0 - 46.0 %   MCV 93.6  78.0 - 100.0 fL   MCH 30.3  26.0 - 34.0 pg   MCHC 32.3  30.0 - 36.0 g/dL   RDW 17.4 (*) 11.5 - 15.5 %   Platelets 244  150 - 400 K/uL  BASIC METABOLIC PANEL     Status: Abnormal   Collection Time    07/15/13  5:40 AM      Result Value Ref Range   Sodium 136 (*) 137 - 147 mEq/L   Potassium 3.9  3.7 - 5.3 mEq/L   Chloride 98  96 - 112 mEq/L   CO2 28  19 - 32 mEq/L   Glucose, Bld 94  70 - 99 mg/dL   BUN 24 (*) 6 - 23 mg/dL   Creatinine, Ser 1.13 (*) 0.50 - 1.10 mg/dL   Calcium 7.9 (*) 8.4 - 10.5 mg/dL   GFR calc non Af Amer 46 (*) >90 mL/min   GFR calc Af Amer 53 (*) >90 mL/min    Studies/Results: No results found.    Assessment: GI bleed. Most likely a diverticular origin  Plan: We will plan colonoscopy tomorrow to further investigate.    Wonda Horner 07/15/2013, 10:15 AM

## 2013-07-15 NOTE — Progress Notes (Signed)
Clinical Social Work Department BRIEF PSYCHOSOCIAL ASSESSMENT 07/15/2013  Patient:  [name], [name]     Account Number:  [id number]     [place] date:  07/13/2013  Clinical Social Worker:  Chistopher Mangino Inez Catalina  Date/Time:  07/15/2013 12:00 M  Referred by:  Physician  Date Referred:  07/14/2013 Referred for  SNF Placement   Other Referral:   Interview type:  Patient Other interview type:    PSYCHOSOCIAL DATA Living Status:  FACILITY Admitted from facility:  [facility] Level of care:  [place] Primary support name:  [name] Primary support relationship to patient:  CHILD, ADULT Degree of support available:   strong    CURRENT CONCERNS Current Concerns  Post-Acute Placement   Other Concerns:    SOCIAL WORK ASSESSMENT / PLAN CSW met with pt at bedside to complete psychosocial assessment and explained csw role. Patient shared she is a current resident at [facility] facility and plans to return when medically stable. Pt shares that her daughter [name] is her primary support. Pt is motivated to return to [facility] when medically stable.    CSW will complete fl2 for MD signature and will place in pt shadow chart for signature.   Assessment/plan status:  Psychosocial Support/Ongoing Assessment of Needs Other assessment/ plan:   Information/referral to community resources:   none identified at this time    PATIENT'S/FAMILY'S RESPONSE TO PLAN OF CARE: Patient thanked csw for concern support. Pt motivated to return to [place] when medically stbale.       Belia Heman, LCSW 07/15/2013 1354pm [phone].

## 2013-07-16 ENCOUNTER — Ambulatory Visit: Payer: Medicare Other | Admitting: Cardiology

## 2013-07-16 LAB — BASIC METABOLIC PANEL
BUN: 21 mg/dL (ref 6–23)
CO2: 28 mEq/L (ref 19–32)
Calcium: 8.5 mg/dL (ref 8.4–10.5)
Chloride: 95 mEq/L — ABNORMAL LOW (ref 96–112)
Creatinine, Ser: 0.99 mg/dL (ref 0.50–1.10)
GFR calc Af Amer: 63 mL/min — ABNORMAL LOW (ref >90)
GFR calc non Af Amer: 54 mL/min — ABNORMAL LOW (ref >90)
Glucose, Bld: 79 mg/dL (ref 70–99)
Potassium: 4.1 mEq/L (ref 3.7–5.3)
Sodium: 134 mEq/L — ABNORMAL LOW (ref 137–147)

## 2013-07-16 LAB — CBC
HCT: 32 % — ABNORMAL LOW (ref 36.0–46.0)
Hemoglobin: 10.3 g/dL — ABNORMAL LOW (ref 12.0–15.0)
MCH: 30.7 pg (ref 26.0–34.0)
MCHC: 32.2 g/dL (ref 30.0–36.0)
MCV: 95.5 fL (ref 78.0–100.0)
PLATELETS: 253 10*3/uL (ref 150–400)
RBC: 3.35 MIL/uL — AB (ref 3.87–5.11)
RDW: 16.8 % — ABNORMAL HIGH (ref 11.5–15.5)
WBC: 6 10*3/uL (ref 4.0–10.5)

## 2013-07-16 MED ORDER — PEG 3350-KCL-NA BICARB-NACL 420 G PO SOLR
4000.0000 mL | Freq: Once | ORAL | Status: AC
  Administered 2013-07-16: 4000 mL via ORAL

## 2013-07-16 NOTE — Progress Notes (Signed)
Colonoscopy rescheduled for tomorrow because we were not sure if she was cleaned out.

## 2013-07-16 NOTE — Progress Notes (Signed)
[facility]                                                                              Patient Demographics  [name], is a 79 y.o. female, DOB - [birth year], UUV:[id number]  Admit date - 07/13/2013   Admitting Physician Cristal Ford, DO  Outpatient Primary MD for the patient is Osborne Casco, MD  LOS - 3   Chief Complaint  Patient presents with  . Blood transfusion       HPI: [name] is a 79 y.o. female with a history of systolic and diastolic CHF, recent GI bleed with hospitalization for pneumonia, hypertension, that presents to the emergency department from her nursing home. Unfortunately no family was present at time of admission, information in history and physical were obtained from the emergency department attending. Patient resides at [facility]. Patient was seen by Dr. Penelope Coop on Monday, 07/09/2013 for followup appointment. At that time patient was noted to have GI bleeding which was characterized as large, and was told to go to the emergency department. Patient was also noted to have a hemoglobin of 6.9 at [facility] approximately 3 days ago. Patient however did not present to the emergency department until 4 days later. At that time, her hemoglobin was noted to be 8.7. Patient has received transfusions at her last hospitalization. Patient also has a history of atrial fibrillation however is no longer on Coumadin as this was held her last hospitalization. Patient only complained of rib pain. She could not give any information regarding her GI bleed. She states she has noted some blood in her pants. Patient could not quantify the amount. Patient denied any constipation or diarrhea at this time.  Assessment & Plan   GI bleed, likely lower  -Fecal occult positive  -likely diverticular in origin -Patient recently admitted in mid-May for her shortness of breath and found to have pneumonia as well as an acute lower GI bleed. Patient received  transfusion at that admission, GI was consulted and recommended conservative management. Coumadin was discontinued at that time. Patient does have a history of diverticulosis which was on CT in April 2015  -Patient was transfused 2u PRBCS on 07/14/2013, hemoglobin currently 10.3 -GI has been consulted, Dr. Darrel Hoover, and colonoscopy planned for today 07/16/2013 -Possible ischemic bowel, however CT abdomen and pelvis with contrast did not show any acute changes, and patient's physical exam is relatively benign  -Continue pain control   Elevated lactic acid  -Resolved, Unknown etiology at this time.  -Was 3.73 at admission and trended downward to 0.8 -Patient is afebrile with no leukocytosis  -UA is negative, patient denies cough or shortness of breath   Acute anemia  -Likely secondary to anemia of chronic disease versus acute bleeding  -Currently hemoglobin is 10.3, baseline is approximately 11  -Treatment and plan as above  -Continue Ferrous fumarate   Chronic atrial fib  -Currently rate controlled  -On no anticoagulation due to to recent GI bleeding   History of chronic systolic and diastolic CHF  -TTE and March of 2015 shows an EF of 55%  -Continue coreg, lisinopril, lasix  -Continue fluid restriction as well as monitor her  daily weights and intake and output   Hypertension  -Continue coreg, lisinopril, lasix   GERD  -Continue PPI  Code Status: Full  Family Communication: None at bedside  Disposition Plan: Admitted.  Colonoscopy planned for today.  Time Spent in minutes   25 minutes  Procedures  None  Consults   Gastroenterology  DVT Prophylaxis  SCDs  Lab Results  Component Value Date   PLT 253 07/16/2013    Medications  Scheduled Meds: . carvedilol  3.125 mg Oral BID WC  . feeding supplement (PRO-STAT SUGAR FREE 64)  30 mL Oral BID  . ferrous fumarate  1 tablet Oral BID  . furosemide  60 mg Oral q morning - 10a  . lisinopril  2.5 mg Oral q morning - 10a  .  pantoprazole  40 mg Oral Daily  . predniSONE  40 mg Oral Q breakfast  . traMADol  50 mg Oral TID   Continuous Infusions: . sodium chloride 20 mL/hr at 07/15/13 1500   PRN Meds:.acetaminophen, acetaminophen, bisacodyl, HYDROcodone-acetaminophen, magnesium hydroxide, morphine injection, promethazine, senna, sodium phosphate  Antibiotics   Anti-infectives   None      Subjective:   [name] seen and examined today.  Patient still complains of abdominal pain which she states is no different. Patient is ready for her colonoscopy today.  She has no complaints of chest pain, shortness of breath.  Objective:   Filed Vitals:   07/15/13 1443 07/15/13 2110 07/16/13 0555 07/16/13 0559  BP:  110/64 134/76   Pulse:  64 66   Temp:  98.1 F (36.7 C) 97.6 F (36.4 C)   TempSrc:  Oral Oral   Resp: 18 18 18    Height:      Weight:    112.22 kg (247 lb 6.4 oz)  SpO2: 98% 99% 96%     Wt Readings from Last 3 Encounters:  07/16/13 112.22 kg (247 lb 6.4 oz)  07/16/13 112.22 kg (247 lb 6.4 oz)  07/11/13 105.688 kg (233 lb)     Intake/Output Summary (Last 24 hours) at 07/16/13 0753 Last data filed at 07/15/13 1333  Gross per 24 hour  Intake    480 ml  Output      0 ml  Net    480 ml    Exam General: Well developed, well nourished, NAD, appears stated age  61: NCAT, mucous membranes moist.  Neck: Supple, no JVD, no masses  Cardiovascular: S1 S2 auscultated, 2/6 SEM, RRR  Respiratory: Clear to auscultation bilaterally with equal chest rise  Abdomen: Soft, obese, diffusely tender, nondistended, + bowel sounds  Extremities: warm dry without cyanosis clubbing or edema Neuro: AAOx3, No focal deficits Skin: Without rashes exudates or nodules  Psych: Normal affect and demeanor   Data Review   Micro Results No results found for this or any previous visit (from the past 240 hour(s)).  Radiology Reports Ct Head Wo Contrast  06/20/2013   CLINICAL DATA:  Confusion.  Altered mental  status.  EXAM: CT HEAD WITHOUT CONTRAST  TECHNIQUE: Contiguous axial images were obtained from the base of the skull through the vertex without intravenous contrast.  COMPARISON:  CT HEAD W/O CM dated 06/03/2013  FINDINGS: No mass lesion, mass effect, midline shift, hydrocephalus, hemorrhage. No acute territorial cortical ischemia/infarct. Atrophy and chronic ischemic white matter disease is present. Mucous retention cyst/polyp is present in the right maxillary sinus.  IMPRESSION: Atrophy and chronic ischemic white matter disease without acute intracranial abnormality.   Electronically Signed  By: Dereck Ligas M.D.   On: 06/20/2013 13:27   Ct Abdomen Pelvis W Contrast  07/13/2013   CLINICAL DATA:  Abdominal pain  EXAM: CT ABDOMEN AND PELVIS WITH CONTRAST  TECHNIQUE: Multidetector CT imaging of the abdomen and pelvis was performed using the standard protocol following bolus administration of intravenous contrast.  CONTRAST:  182mL OMNIPAQUE IOHEXOL 300 MG/ML  SOLN  COMPARISON:  CT abdomen pelvis dated 06/03/2013, 12/27/2010  FINDINGS: Areas of linear density within the lung bases differential considerations are scarring versus atelectasis. The heart is enlarged.  The liver, spleen, adrenals, pancreas is unremarkable. Stable benign appearing bilateral renal cysts.  No abdominal or pelvic masses, free fluid, loculated fluid collections, nor adenopathy.  There is no evidence of an abdominal aortic aneurysm. Celiac, SMA, IMA, portal vein are opacified. Atherosclerotic calcifications are appreciated within the aorta and mesenteric vessels.  Gallbladder fossa is unremarkable. The bowel is negative. A moderate amount of stool within the colon.  A very small fat containing umbilical hernia. No inguinal hernia appreciated.  Areas of spondylosis within the thoracic spine. No aggressive appearing osseous lesions.  IMPRESSION: Stable benign appearing Bosniak category 1 cysts within the kidneys.  Moderate amount of fecal  retention within the colon  Otherwise no evidence of abdominal or pelvic pathology.  Atelectasis versus scarring within the lung bases.   Electronically Signed   By: Margaree Mackintosh M.D.   On: 07/13/2013 15:43   Dg Chest Portable 1 View  06/19/2013   CLINICAL DATA:  Shortness of breath  EXAM: PORTABLE CHEST - 1 VIEW  COMPARISON:  06/03/2013  FINDINGS: Patchy lingular opacity, suspicious for pneumonia.  Possible mild patchy right perihilar opacity, equivocal. Eventration of the left hemidiaphragm. No pleural effusion or pneumothorax.  Cardiomegaly.  IMPRESSION: Suspected lingular pneumonia.   Electronically Signed   By: Julian Hy M.D.   On: 06/19/2013 19:21    CBC  Recent Labs Lab 07/13/13 1325 07/14/13 0541 07/15/13 0540 07/16/13 0510  WBC 6.2 4.9 5.3 6.0  HGB 8.7* 7.3* 9.5* 10.3*  HCT 27.8* 23.7* 29.4* 32.0*  PLT 259 237 244 253  MCV 98.6 99.6 93.6 95.5  MCH 30.9 30.7 30.3 30.7  MCHC 31.3 30.8 32.3 32.2  RDW 16.8* 17.2* 17.4* 16.8*  LYMPHSABS 1.5  --   --   --   MONOABS 0.4  --   --   --   EOSABS 0.2  --   --   --   BASOSABS 0.0  --   --   --     Chemistries   Recent Labs Lab 07/11/13 1303 07/13/13 1325 07/14/13 0541 07/15/13 0540 07/16/13 0510  NA 136 140 143 136* 134*  K 3.3* 4.2 4.1 3.9 4.1  CL 100 101 106 98 95*  CO2 30 29 27 28 28   GLUCOSE 93 77 83 94 79  BUN 31* 30* 23 24* 21  CREATININE 1.1 1.05 0.97 1.13* 0.99  CALCIUM 7.6* 8.2* 7.7* 7.9* 8.5  AST  --  11 8  --   --   ALT  --  10 9  --   --   ALKPHOS  --  88 79  --   --   BILITOT  --  0.2* 0.3  --   --    ------------------------------------------------------------------------------------------------------------------ estimated creatinine clearance is 56.2 ml/min (by C-G formula based on Cr of 0.99). ------------------------------------------------------------------------------------------------------------------ No results found for this basename: HGBA1C,  in the last 72  hours ------------------------------------------------------------------------------------------------------------------ No results found for this  basename: CHOL, HDL, LDLCALC, TRIG, CHOLHDL, LDLDIRECT,  in the last 72 hours ------------------------------------------------------------------------------------------------------------------ No results found for this basename: TSH, T4TOTAL, FREET3, T3FREE, THYROIDAB,  in the last 72 hours ------------------------------------------------------------------------------------------------------------------ No results found for this basename: VITAMINB12, FOLATE, FERRITIN, TIBC, IRON, RETICCTPCT,  in the last 72 hours  Coagulation profile  Recent Labs Lab 07/13/13 1326  INR 0.97    No results found for this basename: DDIMER,  in the last 72 hours  Cardiac Enzymes No results found for this basename: CK, CKMB, TROPONINI, MYOGLOBIN,  in the last 168 hours ------------------------------------------------------------------------------------------------------------------ No components found with this basename: POCBNP,     Kloey Cazarez D.O. on 07/16/2013 at 7:53 AM  Between 7am to 7pm - Pager - [phone]  After 7pm go to [email or web address] - password [email or web address]  And look for the night coverage person covering for me after hours  [facility] Office  [phone]

## 2013-07-16 NOTE — Progress Notes (Signed)
Clinical Social Work  Patient was discussed during progression meeting and will return to [facility] when medically stable. CSW spoke with SNF who is agreeable to accept but needs PT evaluation for insurance authorization. CSW alerted MD of PT needs and MD is agreeable to order. CSW will continue to follow and will assist as needed.  [place], [place] [phone]

## 2013-07-17 DIAGNOSIS — R609 Edema, unspecified: Secondary | ICD-10-CM

## 2013-07-17 LAB — CBC
HEMATOCRIT: 31.2 % — AB (ref 36.0–46.0)
Hemoglobin: 10 g/dL — ABNORMAL LOW (ref 12.0–15.0)
MCH: 30.4 pg (ref 26.0–34.0)
MCHC: 32.1 g/dL (ref 30.0–36.0)
MCV: 94.8 fL (ref 78.0–100.0)
PLATELETS: 280 10*3/uL (ref 150–400)
RBC: 3.29 MIL/uL — AB (ref 3.87–5.11)
RDW: 16.6 % — ABNORMAL HIGH (ref 11.5–15.5)
WBC: 6 10*3/uL (ref 4.0–10.5)

## 2013-07-17 LAB — BASIC METABOLIC PANEL
BUN: 19 mg/dL (ref 6–23)
CALCIUM: 8.3 mg/dL — AB (ref 8.4–10.5)
CHLORIDE: 96 meq/L (ref 96–112)
CO2: 30 meq/L (ref 19–32)
Creatinine, Ser: 0.94 mg/dL (ref 0.50–1.10)
GFR calc Af Amer: 67 mL/min — ABNORMAL LOW (ref >90)
GFR calc non Af Amer: 57 mL/min — ABNORMAL LOW (ref >90)
GLUCOSE: 65 mg/dL — AB (ref 70–99)
Potassium: 3.8 mEq/L (ref 3.7–5.3)
Sodium: 135 mEq/L — ABNORMAL LOW (ref 137–147)

## 2013-07-17 MED ORDER — PEG 3350-KCL-NA BICARB-NACL 420 G PO SOLR
4000.0000 mL | Freq: Once | ORAL | Status: AC
  Administered 2013-07-17: 4000 mL via ORAL

## 2013-07-17 NOTE — Progress Notes (Signed)
I went up to see the patient 1 and a half hours before her colonoscopy was planned and she was still drinking the prep and only had one bowel movement this morning that was reported as brown and not clear. We will postpone colonoscopy until tomorrow.

## 2013-07-17 NOTE — Evaluation (Signed)
Occupational Therapy Evaluation Patient Details Name: [name] MRN: [id number] DOB: 08-22-1934 Today's Date: 07/17/2013    History of Present Illness 79 y.o. female with h/o CHF, obesity, A fib admitted from SNF with lower GIB.    Clinical Impression   Pt currently limited by 10/10 pain all over. Nursing in room and aware. Will benefit from continued OT to work toward increased independence with functional transfers and provide AE education.     Follow Up Recommendations  SNF;Supervision/Assistance - 24 hour    Equipment Recommendations  Other (comment) (defer to next venue)    Recommendations for Other Services       Precautions / Restrictions Precautions Precautions: Fall Restrictions Weight Bearing Restrictions: No      Mobility Bed Mobility Overal bed mobility: Needs Assistance;+2 for physical assistance Bed Mobility: Supine to Sit     Supine to sit: Mod assist     General bed mobility comments: assist to raise trunk and to scoot hips to EOB  Transfers Overall transfer level: Needs assistance Equipment used: Rolling walker (2 wheeled) Transfers: Sit to/from [employer] Transfers Sit to Stand: +2 safety/equipment;Min assist Stand pivot transfers: Min assist;+2 safety/equipment       General transfer comment: +2 for safety due to weakness/fall risk, verbal cues for positioning in RW, min A to maneuver RW    Balance Overall balance assessment: Needs assistance Sitting-balance support: Feet supported Sitting balance-Leahy Scale: Fair     Standing balance support: Bilateral upper extremity supported Standing balance-Leahy Scale: Poor                              ADL Overall ADL's : Needs assistance/impaired Eating/Feeding:  (NPO except clear liquids)   Grooming: Wash/dry hands;Set up;Sitting   Upper Body Bathing: Minimal assitance;Sitting   Lower Body Bathing: +2 for physical assistance;+2 for safety/equipment;Maximal  assistance   Upper Body Dressing : Minimal assistance;Sitting   Lower Body Dressing: +2 for physical assistance;+2 for safety/equipment;Maximal assistance   Toilet Transfer: +2 for physical assistance;+2 for safety/equipment;Minimal assistance;Stand-pivot;BSC   Toileting- Clothing Manipulation and Hygiene: +2 for physical assistance;+2 for safety/equipment;Total assistance;Sit to/from stand         General ADL Comments: Pt currently limited by 10/10 pain all over and primarly L rib area. She has been unable to reach down to don socks, etc since prior to her last hospital admission. Discussed AE options and pt interested. Pt unable to let go of the walker currently to help with toilet hygiene. States they were assisting with clothing/hygiene at [facility].      Vision                     Perception     Praxis      Pertinent Vitals/Pain 10/10 pain all over primarily L rib area. Nursing in room and aware. sats 98% on RA with activity; replaced at end of session.     Hand Dominance     Extremity/Trunk Assessment  UE; generalized weakness.      Cervical / Trunk Assessment Cervical / Trunk Assessment: Normal   Communication Communication Communication: No difficulties   Cognition Arousal/Alertness: Awake/alert Behavior During Therapy: WFL for tasks assessed/performed Overall Cognitive Status: Within Functional Limits for tasks assessed                     General Comments       Exercises  Shoulder Instructions      Home Living Family/patient expects to be discharged to:: Skilled nursing facility Living Arrangements: Alone                                      Prior Functioning/Environment Level of Independence: Needs assistance  Gait / Transfers Assistance Needed: reports she has not walked since leaving the hospital; at times +2 assist for transfer vs use of lift; was walking with RW at home prior to past  hospitalization ADL's / Homemaking Assistance Needed: assist for bathing/dressing and toleting at SNF        OT Diagnosis: Generalized weakness;Acute pain   OT Problem List: Decreased strength;Decreased knowledge of use of DME or AE   OT Treatment/Interventions: Self-care/ADL training;Patient/family education;Therapeutic activities;DME and/or AE instruction    OT Goals(Current goals can be found in the care plan section) Acute Rehab OT Goals Patient Stated Goal: to feel better and get stronger OT Goal Formulation: With patient Time For Goal Achievement: 07/31/13 Potential to Achieve Goals: Good  OT Frequency: Min 2X/week   Barriers to D/C:            Co-evaluation PT/OT/SLP Co-Evaluation/Treatment: Yes Reason for Co-Treatment: For patient/therapist safety PT goals addressed during session: Mobility/safety with mobility;Proper use of DME;Balance OT goals addressed during session: ADL's and self-care;Proper use of Adaptive equipment and DME      End of Session Equipment Utilized During Treatment: Gait belt;Rolling walker  Activity Tolerance: Patient limited by pain Patient left: in chair;with call bell/phone within reach   Time: 0900-0923 OT Time Calculation (min): 23 min Charges:  OT General Charges $OT Visit: 1 Procedure OT Evaluation $Initial OT Evaluation Tier I: 1 Procedure OT Treatments $Therapeutic Activity: 8-22 mins G-Codes:    Alycia Patten Starla Deller [phone] 07/17/2013, 9:49 AM

## 2013-07-17 NOTE — Progress Notes (Signed)
Clinical Social Work  CSW faxed PT/OT evaluations to [facility] who will submit for authorization. CSW will continue to follow and will assist with DC planning when medically stable.  [place], [place] [phone]

## 2013-07-17 NOTE — Progress Notes (Signed)
[facility]                                                                              Patient Demographics  [name], is a 79 y.o. female, DOB - Jun 27, 1934, LOV:[id number]  Admit date - 07/13/2013   Admitting Physician Cristal Ford, DO  Outpatient Primary MD for the patient is Osborne Casco, MD  LOS - 4   Chief Complaint  Patient presents with  . Blood transfusion       HPI/ interim history: [name] is a 79 y.o. female with a history of systolic and diastolic CHF, recent GI bleed with hospitalization for pneumonia, hypertension, that presents to the emergency department from her nursing home. Unfortunately no family was present at time of admission, information in history and physical were obtained from the emergency department attending. Patient resides at [facility]. Patient was seen by Dr. Penelope Coop on Monday, 07/09/2013 for followup appointment. At that time patient was noted to have GI bleeding which was characterized as large, and was told to go to the emergency department. Patient was also noted to have a hemoglobin of 6.9 at [facility] approximately 3 days ago. Patient however did not present to the emergency department until 4 days later. At that time, her hemoglobin was noted to be 8.7. Patient has received transfusions at her last hospitalization. Patient also has a history of atrial fibrillation however is no longer on Coumadin as this was held her last hospitalization. Patient only complained of rib pain. She could not give any information regarding her GI bleed. She states she has noted some blood in her pants. Patient could not quantify the amount. Patient denied any constipation or diarrhea at this time.  Gastroenterology consulted. Patient was planned for colonoscopy on 07/16/2013 however this was postponed a 07/17/2013 as patient may not have been cleaned out. Patient does reside at [facility] and may return once colonoscopy is been  conducted and recommendations have been made by gastroenterology.  Assessment & Plan   GI bleed -Fecal occult positive  -likely diverticular in origin -Patient recently admitted in mid-May for her shortness of breath and found to have pneumonia as well as an acute lower GI bleed. Patient received transfusion at that admission, GI was consulted and recommended conservative management. Coumadin was discontinued at that time. Patient does have a history of diverticulosis which was on CT in April 2015  -Patient was transfused 2u PRBCS on 07/14/2013, hemoglobin currently 10 -GI has been consulted, Dr. Darrel Hoover, and colonoscopy planned for today 07/17/2013 (Colonoscopy was to be conducted on 07/16/2013 however postponed to today as patient may not have been cleaned out.) -Continue pain control   Elevated lactic acid  -Resolved, Unknown etiology at this time.  -Was 3.73 at admission and trended downward to 0.8 -Patient is afebrile with no leukocytosis  -UA is negative, patient denies cough or shortness of breath   Acute anemia  -Likely secondary to anemia of chronic disease versus acute bleeding  -Currently hemoglobin is 10, baseline is approximately 11  -Treatment and plan as above  -Continue Ferrous fumarate   Chronic atrial fib  -Currently rate controlled  -On no anticoagulation due to  to recent GI bleeding   History of chronic systolic and diastolic CHF  -TTE and March of 2015 shows an EF of 55%  -Continue coreg, lisinopril, lasix  -Continue fluid restriction as well as monitor her daily weights and intake and output   Hypertension  -Continue coreg, lisinopril, lasix   GERD  -Continue PPI  Code Status: Full  Family Communication: None at bedside  Disposition Plan: Admitted.  Colonoscopy planned for today.  Time Spent in minutes   25 minutes  Procedures  None  Consults   Gastroenterology  DVT Prophylaxis  SCDs  Lab Results  Component Value Date   PLT 280 07/17/2013     Medications  Scheduled Meds: . carvedilol  3.125 mg Oral BID WC  . feeding supplement (PRO-STAT SUGAR FREE 64)  30 mL Oral BID  . ferrous fumarate  1 tablet Oral BID  . furosemide  60 mg Oral q morning - 10a  . lisinopril  2.5 mg Oral q morning - 10a  . pantoprazole  40 mg Oral Daily  . predniSONE  40 mg Oral Q breakfast  . traMADol  50 mg Oral TID   Continuous Infusions: . sodium chloride 20 mL/hr at 07/15/13 1500   PRN Meds:.acetaminophen, acetaminophen, bisacodyl, HYDROcodone-acetaminophen, magnesium hydroxide, morphine injection, promethazine, senna, sodium phosphate  Antibiotics   Anti-infectives   None      Subjective:   [name] seen and examined today.  Patient still complains of abdominal pain which she states is no different than previous days. Patient is ready for her colonoscopy today, she cannot state whether she has had enough of her GoLYTELY..  She has no complaints of chest pain, shortness of breath.  Objective:   Filed Vitals:   07/16/13 1221 07/16/13 1406 07/16/13 2041 07/17/13 0529  BP: 132/74 109/65 130/65 113/66  Pulse: 66 62 57 64  Temp: 98.6 F (37 C) 98.5 F (36.9 C) 98.2 F (36.8 C) 98.1 F (36.7 C)  TempSrc: Oral Oral Oral Oral  Resp: 18 18 16 18   Height:      Weight:    112.674 kg (248 lb 6.4 oz)  SpO2: 100% 98% 100% 100%    Wt Readings from Last 3 Encounters:  07/17/13 112.674 kg (248 lb 6.4 oz)  07/17/13 112.674 kg (248 lb 6.4 oz)  07/11/13 105.688 kg (233 lb)     Intake/Output Summary (Last 24 hours) at 07/17/13 0816 Last data filed at 07/17/13 0300  Gross per 24 hour  Intake    960 ml  Output      0 ml  Net    960 ml    Exam General: Well developed, well nourished, NAD, appears stated age  HEENT: NCAT, mucous membranes moist.  Neck: Supple, no JVD, no masses  Cardiovascular: S1 S2 auscultated, 2/6 SEM, RRR  Respiratory: Clear to auscultation bilaterally with equal chest rise  Abdomen: Soft, obese, diffusely  tender, nondistended, + bowel sounds  Extremities: warm dry without cyanosis clubbing or edema Neuro: AAOx3, No focal deficits Skin: Without rashes exudates or nodules  Psych: Normal affect and demeanor   Data Review   Micro Results No results found for this or any previous visit (from the past 240 hour(s)).  Radiology Reports Ct Head Wo Contrast  06/20/2013   CLINICAL DATA:  Confusion.  Altered mental status.  EXAM: CT HEAD WITHOUT CONTRAST  TECHNIQUE: Contiguous axial images were obtained from the base of the skull through the vertex without intravenous contrast.  COMPARISON:  CT  HEAD W/O CM dated 06/03/2013  FINDINGS: No mass lesion, mass effect, midline shift, hydrocephalus, hemorrhage. No acute territorial cortical ischemia/infarct. Atrophy and chronic ischemic white matter disease is present. Mucous retention cyst/polyp is present in the right maxillary sinus.  IMPRESSION: Atrophy and chronic ischemic white matter disease without acute intracranial abnormality.   Electronically Signed   By: Dereck Ligas M.D.   On: 06/20/2013 13:27   Ct Abdomen Pelvis W Contrast  07/13/2013   CLINICAL DATA:  Abdominal pain  EXAM: CT ABDOMEN AND PELVIS WITH CONTRAST  TECHNIQUE: Multidetector CT imaging of the abdomen and pelvis was performed using the standard protocol following bolus administration of intravenous contrast.  CONTRAST:  115mL OMNIPAQUE IOHEXOL 300 MG/ML  SOLN  COMPARISON:  CT abdomen pelvis dated 06/03/2013, 12/27/2010  FINDINGS: Areas of linear density within the lung bases differential considerations are scarring versus atelectasis. The heart is enlarged.  The liver, spleen, adrenals, pancreas is unremarkable. Stable benign appearing bilateral renal cysts.  No abdominal or pelvic masses, free fluid, loculated fluid collections, nor adenopathy.  There is no evidence of an abdominal aortic aneurysm. Celiac, SMA, IMA, portal vein are opacified. Atherosclerotic calcifications are appreciated within  the aorta and mesenteric vessels.  Gallbladder fossa is unremarkable. The bowel is negative. A moderate amount of stool within the colon.  A very small fat containing umbilical hernia. No inguinal hernia appreciated.  Areas of spondylosis within the thoracic spine. No aggressive appearing osseous lesions.  IMPRESSION: Stable benign appearing Bosniak category 1 cysts within the kidneys.  Moderate amount of fecal retention within the colon  Otherwise no evidence of abdominal or pelvic pathology.  Atelectasis versus scarring within the lung bases.   Electronically Signed   By: Margaree Mackintosh M.D.   On: 07/13/2013 15:43   Dg Chest Portable 1 View  06/19/2013   CLINICAL DATA:  Shortness of breath  EXAM: PORTABLE CHEST - 1 VIEW  COMPARISON:  06/03/2013  FINDINGS: Patchy lingular opacity, suspicious for pneumonia.  Possible mild patchy right perihilar opacity, equivocal. Eventration of the left hemidiaphragm. No pleural effusion or pneumothorax.  Cardiomegaly.  IMPRESSION: Suspected lingular pneumonia.   Electronically Signed   By: Julian Hy M.D.   On: 06/19/2013 19:21    CBC  Recent Labs Lab 07/13/13 1325 07/14/13 0541 07/15/13 0540 07/16/13 0510 07/17/13 0510  WBC 6.2 4.9 5.3 6.0 6.0  HGB 8.7* 7.3* 9.5* 10.3* 10.0*  HCT 27.8* 23.7* 29.4* 32.0* 31.2*  PLT 259 237 244 253 280  MCV 98.6 99.6 93.6 95.5 94.8  MCH 30.9 30.7 30.3 30.7 30.4  MCHC 31.3 30.8 32.3 32.2 32.1  RDW 16.8* 17.2* 17.4* 16.8* 16.6*  LYMPHSABS 1.5  --   --   --   --   MONOABS 0.4  --   --   --   --   EOSABS 0.2  --   --   --   --   BASOSABS 0.0  --   --   --   --     Chemistries   Recent Labs Lab 07/13/13 1325 07/14/13 0541 07/15/13 0540 07/16/13 0510 07/17/13 0510  NA 140 143 136* 134* 135*  K 4.2 4.1 3.9 4.1 3.8  CL 101 106 98 95* 96  CO2 29 27 28 28 30   GLUCOSE 77 83 94 79 65*  BUN 30* 23 24* 21 19  CREATININE 1.05 0.97 1.13* 0.99 0.94  CALCIUM 8.2* 7.7* 7.9* 8.5 8.3*  AST 11 8  --   --   --  ALT  10 9  --   --   --   ALKPHOS 88 79  --   --   --   BILITOT 0.2* 0.3  --   --   --    ------------------------------------------------------------------------------------------------------------------ estimated creatinine clearance is 59.3 ml/min (by C-G formula based on Cr of 0.94). ------------------------------------------------------------------------------------------------------------------ No results found for this basename: HGBA1C,  in the last 72 hours ------------------------------------------------------------------------------------------------------------------ No results found for this basename: CHOL, HDL, LDLCALC, TRIG, CHOLHDL, LDLDIRECT,  in the last 72 hours ------------------------------------------------------------------------------------------------------------------ No results found for this basename: TSH, T4TOTAL, FREET3, T3FREE, THYROIDAB,  in the last 72 hours ------------------------------------------------------------------------------------------------------------------ No results found for this basename: VITAMINB12, FOLATE, FERRITIN, TIBC, IRON, RETICCTPCT,  in the last 72 hours  Coagulation profile  Recent Labs Lab 07/13/13 1326  INR 0.97    No results found for this basename: DDIMER,  in the last 72 hours  Cardiac Enzymes No results found for this basename: CK, CKMB, TROPONINI, MYOGLOBIN,  in the last 168 hours ------------------------------------------------------------------------------------------------------------------ No components found with this basename: POCBNP,     Anah Billard D.O. on 07/17/2013 at 8:16 AM  Between 7am to 7pm - Pager - [phone]  After 7pm go to [email or web address] - password [email or web address]  And look for the night coverage person covering for me after hours  [facility] Office  [phone]

## 2013-07-17 NOTE — Care Management Note (Unsigned)
    Page 1 of 1   07/17/2013     11:15:21 AM CARE MANAGEMENT NOTE 07/17/2013  Patient:  [name], [name]   Account Number:  [id number]  Date Initiated:  07/17/2013  Documentation initiated by:  [facility]  Subjective/Objective Assessment:   79 year old female admitted with GI bleeding.     Action/Plan:   From [facility].   Anticipated DC Date:  07/18/2013   Anticipated DC Plan:  SKILLED NURSING FACILITY  In-house referral  Clinical Social Worker      DC Planning Services  CM consult      Choice offered to / List presented to:             Status of service:  Completed, signed off Medicare Important Message given?   (If response is "NO", the following Medicare IM given date fields will be blank) Date Medicare IM given:   Date Additional Medicare IM given:    Discharge Disposition:    Per UR Regulation:  Reviewed for med. necessity/level of care/duration of stay  If discussed at [place] of Stay Meetings, dates discussed:    Comments:

## 2013-07-17 NOTE — Evaluation (Signed)
Physical Therapy Evaluation Patient Details Name: [name] MRN: [id number] DOB: 08/31/1934 Today's Date: 07/17/2013   History of Present Illness  79 y.o. female with h/o CHF, obesity, A fib admitted from SNF with lower GIB.   Clinical Impression  *Pt admitted with **lower GIB*. Pt currently with functional limitations due to the deficits listed below (see PT Problem List).  Pt will benefit from skilled PT to increase their independence and safety with mobility to allow discharge to the venue listed below.   *    Follow Up Recommendations SNF;Supervision/Assistance - 24 hour    Equipment Recommendations  None recommended by PT    Recommendations for Other Services       Precautions / Restrictions Precautions Precautions: Fall Restrictions Weight Bearing Restrictions: No      Mobility  Bed Mobility Overal bed mobility: Needs Assistance;+2 for physical assistance Bed Mobility: Supine to Sit     Supine to sit: Mod assist     General bed mobility comments: assist to raise trunk and to scoot hips to EOB  Transfers Overall transfer level: Needs assistance Equipment used: Rolling walker (2 wheeled) Transfers: Sit to/from [employer] Transfers Sit to Stand: +2 safety/equipment;Min assist Stand pivot transfers: Min assist;+2 safety/equipment       General transfer comment: +2 for safety due to weakness/fall risk, verbal cues for positioning in RW, min A to maneuver RW  Ambulation/Gait Ambulation/Gait assistance: +2 safety/equipment Ambulation Distance (Feet): 3 Feet Assistive device: Rolling walker (2 wheeled) Gait Pattern/deviations: Step-to pattern;Decreased step length - right;Decreased step length - left     General Gait Details: distance limited by pain/fatigue, cues for positioning in RW  Stairs            Wheelchair Mobility    Modified Rankin (Stroke Patients Only)       Balance Overall balance assessment: Needs  assistance Sitting-balance support: Feet supported Sitting balance-Leahy Scale: Fair     Standing balance support: Bilateral upper extremity supported Standing balance-Leahy Scale: Poor                               Pertinent Vitals/Pain *SaO2 98% on RA with activity 10/10 pain "all over", especially L ribs, RN notified, pain meds requested**    Home Living Family/patient expects to be discharged to:: Skilled nursing facility                      Prior Function Level of Independence: Needs assistance   Gait / Transfers Assistance Needed: reports she has not walked since leaving the hospital; at times +2 assist for transfer vs use of lift; was walking with RW at home prior to past hospitalization  ADL's / Homemaking Assistance Needed: assist for bathing/dressing at SNF        Hand Dominance        Extremity/Trunk Assessment               Lower Extremity Assessment: Generalized weakness RLE Deficits / Details: knee ext +3/5 LLE Deficits / Details: knee ext -4/5  Cervical / Trunk Assessment: Normal  Communication   Communication: No difficulties  Cognition Arousal/Alertness: Awake/alert Behavior During Therapy: WFL for tasks assessed/performed Overall Cognitive Status: Within Functional Limits for tasks assessed                      General Comments      Exercises  Assessment/Plan    PT Assessment Patient needs continued PT services  PT Diagnosis Generalized weakness   PT Problem List Decreased strength;Decreased range of motion;Decreased activity tolerance;Decreased mobility;Decreased knowledge of use of DME;Impaired sensation;Obesity;Pain  PT Treatment Interventions DME instruction;Functional mobility training;Therapeutic activities;Therapeutic exercise;Balance training;Cognitive remediation;Patient/family education   PT Goals (Current goals can be found in the Care Plan section) Acute Rehab PT Goals Patient Stated  Goal: to feel better and get stronger PT Goal Formulation: With patient Time For Goal Achievement: 07/31/13 Potential to Achieve Goals: Fair    Frequency Min 3X/week   Barriers to discharge        Co-evaluation PT/OT/SLP Co-Evaluation/Treatment: Yes Reason for Co-Treatment: For patient/therapist safety PT goals addressed during session: Mobility/safety with mobility;Proper use of DME;Balance OT goals addressed during session: ADL's and self-care       End of Session Equipment Utilized During Treatment: Oxygen Activity Tolerance: Patient limited by fatigue;Patient limited by pain Patient left: in chair;with call bell/phone within reach Nurse Communication: Mobility status         Time: 5726-2035 PT Time Calculation (min): 34 min   Charges:   PT Evaluation $Initial PT Evaluation Tier I: 1 Procedure PT Treatments $Therapeutic Activity: 8-22 mins   PT G CodesLucile Crater 07/17/2013, 9:34 AM [phone]

## 2013-07-18 ENCOUNTER — Encounter (HOSPITAL_COMMUNITY)
Admission: EM | Disposition: A | Discharge status: Skilled Nursing Facility | Payer: Self-pay | Source: Home / Self Care | Attending: Internal Medicine

## 2013-07-18 ENCOUNTER — Encounter (HOSPITAL_COMMUNITY): Payer: Self-pay

## 2013-07-18 HISTORY — PX: COLONOSCOPY: SHX5424

## 2013-07-18 LAB — BASIC METABOLIC PANEL
BUN: 16 mg/dL (ref 6–23)
CALCIUM: 8.3 mg/dL — AB (ref 8.4–10.5)
CO2: 29 mEq/L (ref 19–32)
CREATININE: 0.82 mg/dL (ref 0.50–1.10)
Chloride: 99 mEq/L (ref 96–112)
GFR, EST AFRICAN AMERICAN: 79 mL/min — AB (ref >90)
GFR, EST NON AFRICAN AMERICAN: 68 mL/min — AB (ref >90)
Glucose, Bld: 72 mg/dL (ref 70–99)
Potassium: 4 mEq/L (ref 3.7–5.3)
Sodium: 137 mEq/L (ref 137–147)

## 2013-07-18 LAB — CBC
HCT: 31.7 % — ABNORMAL LOW (ref 36.0–46.0)
Hemoglobin: 9.9 g/dL — ABNORMAL LOW (ref 12.0–15.0)
MCH: 30.4 pg (ref 26.0–34.0)
MCHC: 31.2 g/dL (ref 30.0–36.0)
MCV: 97.2 fL (ref 78.0–100.0)
PLATELETS: 286 10*3/uL (ref 150–400)
RBC: 3.26 MIL/uL — AB (ref 3.87–5.11)
RDW: 16.3 % — AB (ref 11.5–15.5)
WBC: 6 10*3/uL (ref 4.0–10.5)

## 2013-07-18 SURGERY — COLONOSCOPY
Anesthesia: Moderate Sedation

## 2013-07-18 MED ORDER — MIDAZOLAM HCL 10 MG/2ML IJ SOLN
INTRAMUSCULAR | Status: AC
  Filled 2013-07-18: qty 2

## 2013-07-18 MED ORDER — MIDAZOLAM HCL 5 MG/5ML IJ SOLN
INTRAMUSCULAR | Status: DC | PRN
  Administered 2013-07-18 (×2): 2 mg via INTRAVENOUS
  Administered 2013-07-18: 1 mg via INTRAVENOUS

## 2013-07-18 MED ORDER — FENTANYL CITRATE 0.05 MG/ML IJ SOLN
INTRAMUSCULAR | Status: DC | PRN
  Administered 2013-07-18 (×2): 25 ug via INTRAVENOUS

## 2013-07-18 MED ORDER — FENTANYL CITRATE 0.05 MG/ML IJ SOLN
INTRAMUSCULAR | Status: AC
  Filled 2013-07-18: qty 2

## 2013-07-18 NOTE — Op Note (Signed)
[facility] [place], [address]   COLONOSCOPY PROCEDURE REPORT  PATIENT: [name], [name]  MR#: [id number] BIRTHDATE: April 20, 1934 , 78  yrs. old GENDER: Female ENDOSCOPIST: Acquanetta Sit, MD REFERRED BY: PROCEDURE DATE:  07/18/2013 PROCEDURE:   colonoscopy with biopsy ASA CLASS:   3 INDICATIONS:rectal bleeding MEDICATIONS: fentanyl 50 mcg IV Versed 5 mg IV  DESCRIPTION OF PROCEDURE:   After the risks benefits and alternatives of the procedure were thoroughly explained, informed consent was obtained.  digital rectal exam did not reveal any masses, there was a small hemorrhoids seen externally.        The Pentax Ped Colon [id number]  endoscope was introduced through the anus and advanced to the cecum     . No adverse events experienced. The quality of the prep was good, however it took 3 days and about 2-1/2-3 gallons of GoLYTELY to clean her out.       The instrument was then slowly withdrawn as the colon was fully examined.  The cecum and descending colon looked normal  The transverse colon looked normal  The descending colon and sigmoid revealed a few diverticula.  There was a 1.5 cm distal rectal ( stercoral ulcer). Biopsies obtained.      The time to cecum=  .  Withdrawal time=  .  The scope was withdrawn and the procedure completed. COMPLICATIONS: There were no complications.  ENDOSCOPIC IMPRESSION:  #1. Distal rectal stercoral ulcer  #2. Scattered diverticula in the left colon    RECOMMENDATIONS:I believe the rectal bleeding that she has been experiencing is related to the stercoral ulcer in the distal rectum. This is a function of her chronic constipation. As mentioned above it took 3 days to clean her colon out in order to do this colonoscopy.   I think she can be discharged back to the nursing home. I would recommend a therapeutic trial of a trial of Amitiza 24 micrograms by mouth twice a day to try to prevent constipation,  which is why she has obviously developed this distal rectal stercoral ulcer. Biopsies will be checked.  eSigned:  Acquanetta Sit, MD 07/18/2013 1:45 PM   cc:   PATIENT NAME:  Zylah, Elsbernd MR#: [id number]

## 2013-07-18 NOTE — Progress Notes (Signed)
[facility] PROGRESS NOTE  [name] EUM:[id number] DOB: September 13, 1934 DOA: 07/13/2013 PCP: Osborne Casco, MD  Assessment/Plan:  GI bleed  - Fecal occult positive  - GI on board and currently plans are for further work up - monitoring CBCs  Elevated lactic acid  -Resolved, Unknown etiology at this time.  -Was 3.73 at admission and trended downward to 0.8  -Patient is afebrile with no leukocytosis  -UA is negative, patient denies cough or shortness of breath   Acute anemia  -Likely secondary to anemia of chronic disease versus acute bleeding  -Currently hemoglobin is 9.9, baseline is approximately 11  -Treatment and plan as above  -Continue Ferrous fumarate   Chronic atrial fib  -Currently rate controlled  -On no anticoagulation due to to recent GI bleeding   History of chronic systolic and diastolic CHF  -TTE and March of 2015 shows an EF of 55%  -Continue coreg, lisinopril, lasix  -Continue fluid restriction as well as monitor her daily weights and intake and output   Hypertension  -Continue coreg, lisinopril, lasix   GERD  -Continue PPI   Code Status: full Family Communication: None at bedside. Disposition Plan: Pending further work up    Consultants:  GI  Procedures:  Planned colonoscopy  Antibiotics:  None  HPI/Subjective: Pt has no new complaints. No acute issues reported overnight.  Objective: Filed Vitals:   07/18/13 1409  BP: 145/67  Pulse: 70  Temp:   Resp: 16    Intake/Output Summary (Last 24 hours) at 07/18/13 1426 Last data filed at 07/18/13 1200  Gross per 24 hour  Intake      0 ml  Output    700 ml  Net   -700 ml   Filed Weights   07/16/13 0559 07/17/13 0529 07/18/13 0530  Weight: 112.22 kg (247 lb 6.4 oz) 112.674 kg (248 lb 6.4 oz) 113.036 kg (249 lb 3.2 oz)    Exam:   General:  Pt in nAD, alert and awake  Cardiovascular: RRR, no MRG  Respiratory: CTA BL, no wheezes  Abdomen: soft, NT,  ND  Musculoskeletal: no cyanosis or clubbing   Data Reviewed: Basic Metabolic Panel:  Recent Labs Lab 07/14/13 0541 07/15/13 0540 07/16/13 0510 07/17/13 0510 07/18/13 0454  NA 143 136* 134* 135* 137  K 4.1 3.9 4.1 3.8 4.0  CL 106 98 95* 96 99  CO2 27 28 28 30 29   GLUCOSE 83 94 79 65* 72  BUN 23 24* 21 19 16   CREATININE 0.97 1.13* 0.99 0.94 0.82  CALCIUM 7.7* 7.9* 8.5 8.3* 8.3*   Liver Function Tests:  Recent Labs Lab 07/13/13 1325 07/14/13 0541  AST 11 8  ALT 10 9  ALKPHOS 88 79  BILITOT 0.2* 0.3  PROT 6.2 5.0*  ALBUMIN 3.0* 2.4*    Recent Labs Lab 07/13/13 1325  LIPASE 13   No results found for this basename: AMMONIA,  in the last 168 hours CBC:  Recent Labs Lab 07/13/13 1325 07/14/13 0541 07/15/13 0540 07/16/13 0510 07/17/13 0510 07/18/13 0454  WBC 6.2 4.9 5.3 6.0 6.0 6.0  NEUTROABS 4.1  --   --   --   --   --   HGB 8.7* 7.3* 9.5* 10.3* 10.0* 9.9*  HCT 27.8* 23.7* 29.4* 32.0* 31.2* 31.7*  MCV 98.6 99.6 93.6 95.5 94.8 97.2  PLT 259 237 244 253 280 286   Cardiac Enzymes: No results found for this basename: CKTOTAL, CKMB, CKMBINDEX, TROPONINI,  in the last 168 hours  BNP (last 3 results)  Recent Labs  06/03/13 1825 06/22/13 0621  PROBNP 27115.0* 6129.0*   CBG:  Recent Labs Lab 07/13/13 2209  GLUCAP 77    No results found for this or any previous visit (from the past 240 hour(s)).   Studies: No results found.  Scheduled Meds: . carvedilol  3.125 mg Oral BID WC  . feeding supplement (PRO-STAT SUGAR FREE 64)  30 mL Oral BID  . ferrous fumarate  1 tablet Oral BID  . furosemide  60 mg Oral q morning - 10a  . lisinopril  2.5 mg Oral q morning - 10a  . pantoprazole  40 mg Oral Daily  . predniSONE  40 mg Oral Q breakfast  . traMADol  50 mg Oral TID   Continuous Infusions: . sodium chloride 20 mL/hr at 07/15/13 1500    Active Problems:   HYPERTENSION   COMBINED HEART FAILURE, CHRONIC   Chronic pain   GI bleed    Time spent:  > 35 minutes    [name]  [facility] Pager [phone] If 7PM-7AM, please contact night-coverage at [email or web address], password [facility] 07/18/2013, 2:26 PM  LOS: 5 days

## 2013-07-19 MED ORDER — LUBIPROSTONE 24 MCG PO CAPS: 24.0000 ug | ORAL_CAPSULE | Freq: Two times a day (BID) | ORAL | Status: AC

## 2013-07-19 MED ORDER — TRAMADOL HCL 50 MG PO TABS: 50.0000 mg | ORAL_TABLET | Freq: Three times a day (TID) | ORAL | Status: DC

## 2013-07-19 NOTE — Progress Notes (Signed)
Clinical Social Work  CSW faxed DC summary to [facility] who is agreeable to accept patient. RN to call report. CSW prepared DC packet with FL2 and hard scripts included. CSW informed patient and dtr [name]) of DC plans and both parties agreeable. Patient prefers [facility] to transport because she does not feel she can get in and out of dtr's car. Patient and dtr aware of no guarantee of payment for [facility]. [facility] request #: [id number].  CSW is signing off but available if needed.  [place], [place] [phone]

## 2013-07-19 NOTE — Discharge Summary (Addendum)
Physician Discharge Summary  [name] HUD:[id number] DOB: Jul 24, 1934 DOA: 07/13/2013  PCP: Osborne Casco, MD  Admit date: 07/13/2013 Discharge date: 07/19/2013  Time spent: > 35 minutes  Recommendations for Outpatient Follow-up:  1. Please have pcp or rheumatologist address ongoing prednisone use and decide on frequency, length of treatment, etc. 2. F/u with GI for recommendations on when to restart coumadin within the next 2-4 weeks.  Discharge Diagnoses:  Active Problems:   HYPERTENSION   COMBINED HEART FAILURE, CHRONIC   Chronic pain   GI bleed   Discharge Condition: Stable  Diet recommendation: regular diet  Filed Weights   07/17/13 0529 07/18/13 0530 07/19/13 0600  Weight: 112.674 kg (248 lb 6.4 oz) 113.036 kg (249 lb 3.2 oz) 113.036 kg (249 lb 3.2 oz)    History of present illness:  [name] is a 79 y.o. female  With a history of systolic and diastolic CHF, recent GI bleed with hospitalization for pneumonia, hypertension, that presents to the emergency department from her nursing home. Pt presented after she was found to have GI bleeding.  Hospital Course:   1) GI bleed - Patient had colonoscopy while in house performed by Dr.Ganem who recommended the following:  I think she can be discharged back to the nursing home. I would recommend a therapeutic trial of a trial of Amitiza 24 micrograms by mouth twice a day to try to prevent constipation, which is why she has obviously developed this distal rectal stercoral ulcer. Biopsies will be checked. - Patient is to followup with GI for further recommendations and plans on when to restart Coumadin. If patient felt to be candidate for restarting this medicine  Procedures:  Colonoscopy  Consultations:  Gastroenterology  Discharge Exam: Filed Vitals:   07/19/13 1401  BP: 127/71  Pulse: 77  Temp: 98.7 F (37.1 C)  Resp: 18    General: Patient in no acute distress, alert and awake Cardiovascular:  Irregularly irregular rate controlled, no murmurs or Respiratory: Clear to auscultation bilaterally no increased work of breathing on room air  Discharge Instructions You were cared for by a hospitalist during your hospital stay. If you have any questions about your discharge medications or the care you received while you were in the hospital after you are discharged, you can call the unit and asked to speak with the hospitalist on call if the hospitalist that took care of you is not available. Once you are discharged, your primary care physician will handle any further medical issues. Please note that NO REFILLS for any discharge medications will be authorized once you are discharged, as it is imperative that you return to your primary care physician (or establish a relationship with a primary care physician if you do not have one) for your aftercare needs so that they can reassess your need for medications and monitor your lab values.      Discharge Instructions   Call MD for:  difficulty breathing, headache or visual disturbances    Complete by:  As directed      Call MD for:  temperature >100.4    Complete by:  As directed      Diet - low sodium heart healthy    Complete by:  As directed      Discharge instructions    Complete by:  As directed   Clarify with primary care physician how long patient will require taking prednisone at home.     Increase activity slowly    Complete by:  As directed             Medication List    STOP taking these medications       HYDROcodone-acetaminophen 5-325 MG per tablet  Commonly known as:  NORCO/VICODIN     senna 8.6 MG Tabs tablet  Commonly known as:  SENOKOT     sodium phosphate enema  Commonly known as:  FLEET      TAKE these medications       bisacodyl 10 MG suppository  Commonly known as:  DULCOLAX  Place 10 mg rectally daily as needed for moderate constipation.     carvedilol 3.125 MG tablet  Commonly known as:  COREG  Take  3.125 mg by mouth 2 (two) times daily with a meal. Hold if SBP <100     feeding supplement (PRO-STAT SUGAR FREE 64) Liqd  Take 30 mLs by mouth 2 (two) times daily. 0800 and 1600     FERRETTS 325 (106 FE) MG Tabs tablet  Generic drug:  ferrous fumarate  Take 1 tablet by mouth 2 (two) times daily. 8am and 4pm     furosemide 20 MG tablet  Commonly known as:  LASIX  Take 60 mg by mouth every morning.     lisinopril 5 MG tablet  Commonly known as:  PRINIVIL,ZESTRIL  Take 2.5 mg by mouth every morning.     lubiprostone 24 MCG capsule  Commonly known as:  AMITIZA  Take 1 capsule (24 mcg total) by mouth 2 (two) times daily with a meal.     magnesium hydroxide 400 MG/5ML suspension  Commonly known as:  MILK OF MAGNESIA  Take 30 mLs by mouth daily as needed for mild constipation.     omeprazole 20 MG capsule  Commonly known as:  PRILOSEC  Take 20 mg by mouth 2 (two) times daily.     predniSONE 20 MG tablet  Commonly known as:  DELTASONE  Take 40 mg by mouth daily with breakfast.     promethazine 25 MG tablet  Commonly known as:  PHENERGAN  Take 25 mg by mouth every 6 (six) hours as needed for nausea or vomiting.     traMADol 50 MG tablet  Commonly known as:  ULTRAM  Take 1 tablet (50 mg total) by mouth 3 (three) times daily.       Allergies  Allergen Reactions  . Levaquin [Levofloxacin] Other (See Comments)    Per MAR  . Penicillins Other (See Comments)    Per MAR  . Tetanus-Diphtheria Toxoids Td Other (See Comments)    Per MAR  . Latex Hives and Rash      The results of significant diagnostics from this hospitalization (including imaging, microbiology, ancillary and laboratory) are listed below for reference.    Significant Diagnostic Studies: Ct Head Wo Contrast  06/20/2013   CLINICAL DATA:  Confusion.  Altered mental status.  EXAM: CT HEAD WITHOUT CONTRAST  TECHNIQUE: Contiguous axial images were obtained from the base of the skull through the vertex without  intravenous contrast.  COMPARISON:  CT HEAD W/O CM dated 06/03/2013  FINDINGS: No mass lesion, mass effect, midline shift, hydrocephalus, hemorrhage. No acute territorial cortical ischemia/infarct. Atrophy and chronic ischemic white matter disease is present. Mucous retention cyst/polyp is present in the right maxillary sinus.  IMPRESSION: Atrophy and chronic ischemic white matter disease without acute intracranial abnormality.   Electronically Signed   By: Dereck Ligas M.D.   On: 06/20/2013 13:27   Ct Abdomen Pelvis W Contrast  07/13/2013   CLINICAL DATA:  Abdominal pain  EXAM: CT ABDOMEN AND PELVIS WITH CONTRAST  TECHNIQUE: Multidetector CT imaging of the abdomen and pelvis was performed using the standard protocol following bolus administration of intravenous contrast.  CONTRAST:  144mL OMNIPAQUE IOHEXOL 300 MG/ML  SOLN  COMPARISON:  CT abdomen pelvis dated 06/03/2013, 12/27/2010  FINDINGS: Areas of linear density within the lung bases differential considerations are scarring versus atelectasis. The heart is enlarged.  The liver, spleen, adrenals, pancreas is unremarkable. Stable benign appearing bilateral renal cysts.  No abdominal or pelvic masses, free fluid, loculated fluid collections, nor adenopathy.  There is no evidence of an abdominal aortic aneurysm. Celiac, SMA, IMA, portal vein are opacified. Atherosclerotic calcifications are appreciated within the aorta and mesenteric vessels.  Gallbladder fossa is unremarkable. The bowel is negative. A moderate amount of stool within the colon.  A very small fat containing umbilical hernia. No inguinal hernia appreciated.  Areas of spondylosis within the thoracic spine. No aggressive appearing osseous lesions.  IMPRESSION: Stable benign appearing Bosniak category 1 cysts within the kidneys.  Moderate amount of fecal retention within the colon  Otherwise no evidence of abdominal or pelvic pathology.  Atelectasis versus scarring within the lung bases.    Electronically Signed   By: Margaree Mackintosh M.D.   On: 07/13/2013 15:43   Dg Chest Portable 1 View  06/19/2013   CLINICAL DATA:  Shortness of breath  EXAM: PORTABLE CHEST - 1 VIEW  COMPARISON:  06/03/2013  FINDINGS: Patchy lingular opacity, suspicious for pneumonia.  Possible mild patchy right perihilar opacity, equivocal. Eventration of the left hemidiaphragm. No pleural effusion or pneumothorax.  Cardiomegaly.  IMPRESSION: Suspected lingular pneumonia.   Electronically Signed   By: Julian Hy M.D.   On: 06/19/2013 19:21    Microbiology: No results found for this or any previous visit (from the past 240 hour(s)).   Labs: Basic Metabolic Panel:  Recent Labs Lab 07/14/13 0541 07/15/13 0540 07/16/13 0510 07/17/13 0510 07/18/13 0454  NA 143 136* 134* 135* 137  K 4.1 3.9 4.1 3.8 4.0  CL 106 98 95* 96 99  CO2 27 28 28 30 29   GLUCOSE 83 94 79 65* 72  BUN 23 24* 21 19 16   CREATININE 0.97 1.13* 0.99 0.94 0.82  CALCIUM 7.7* 7.9* 8.5 8.3* 8.3*   Liver Function Tests:  Recent Labs Lab 07/13/13 1325 07/14/13 0541  AST 11 8  ALT 10 9  ALKPHOS 88 79  BILITOT 0.2* 0.3  PROT 6.2 5.0*  ALBUMIN 3.0* 2.4*    Recent Labs Lab 07/13/13 1325  LIPASE 13   No results found for this basename: AMMONIA,  in the last 168 hours CBC:  Recent Labs Lab 07/13/13 1325 07/14/13 0541 07/15/13 0540 07/16/13 0510 07/17/13 0510 07/18/13 0454  WBC 6.2 4.9 5.3 6.0 6.0 6.0  NEUTROABS 4.1  --   --   --   --   --   HGB 8.7* 7.3* 9.5* 10.3* 10.0* 9.9*  HCT 27.8* 23.7* 29.4* 32.0* 31.2* 31.7*  MCV 98.6 99.6 93.6 95.5 94.8 97.2  PLT 259 237 244 253 280 286   Cardiac Enzymes: No results found for this basename: CKTOTAL, CKMB, CKMBINDEX, TROPONINI,  in the last 168 hours BNP: BNP (last 3 results)  Recent Labs  06/03/13 1825 06/22/13 0621  PROBNP 27115.0* 6129.0*   CBG:  Recent Labs Lab 07/13/13 2209  GLUCAP 77       Signed:  Velvet Bathe  [facility] 07/19/2013,  2:28 PM

## 2013-07-19 NOTE — Progress Notes (Signed)
Patient discharge to [facility] and oriented, transferred by [facility], discharge package given to [facility] for delivery to SNF, My Chart access declined at this time, patient stated," I do not use a compute," patient in stable condition at this time

## 2013-07-19 NOTE — Progress Notes (Signed)
Occupational Therapy Treatment Patient Details Name: [name] MRN: [id number] DOB: 1934-12-20 Today's Date: 07/19/2013    History of present illness 79 y.o. female with h/o CHF, obesity, A fib admitted from SNF with lower GIB.       Follow Up Recommendations  SNF;Supervision/Assistance - 24 hour    Equipment Recommendations  Other (comment)                  ADL                                         General ADL Comments: Pt agreed to BUE ROM bed level.  Goal added for this as pt understands importance of maintaining BUe ROM/strength for ADL activity                      Exercises Shoulder Exercises Shoulder Flexion: AROM;Both;10 reps;Other (comment) (2 sets. Pt needed increased time and encourgement) Elbow Flexion: AROM;Both;10 reps Elbow Extension: AROM;Both;10 reps Wrist Flexion: AROM;Both;10 reps Wrist Extension: AROM;Both;10 reps Digit Composite Flexion: AROM;Both;10 reps Composite Extension: AROM;Both;10 reps   Shoulder Instructions       General Comments      Pertinent Vitals/ Pain   Prior Functioning/Environment              Frequency Min 2X/week     Progress Toward Goals  OT Goals(current goals can now be found in the care plan section)  Progress towards OT goals:  (goal added)  ADL Goals Pt/caregiver will Perform Home Exercise Program: Increased strength;Both right and left upper extremity;With minimal assist;With written HEP provided  Plan Discharge plan remains appropriate       End of Session     Activity Tolerance Patient limited by pain   Patient Left in bed;with nursing/sitter in room           Time: 1019-1043 OT Time Calculation (min): 24 min  Charges: OT General Charges $OT Visit: 1 Procedure OT Treatments $Therapeutic Exercise: 23-37 mins  Kaelem Brach, Thereasa Parkin 07/19/2013, 10:47 AM

## 2013-07-20 ENCOUNTER — Encounter (HOSPITAL_COMMUNITY): Payer: Self-pay | Admitting: Gastroenterology

## 2013-07-26 ENCOUNTER — Encounter: Payer: Self-pay | Admitting: Internal Medicine

## 2013-07-26 ENCOUNTER — Non-Acute Institutional Stay (SKILLED_NURSING_FACILITY): Payer: Medicare Other | Admitting: Internal Medicine

## 2013-07-26 DIAGNOSIS — K625 Hemorrhage of anus and rectum: Secondary | ICD-10-CM

## 2013-07-26 DIAGNOSIS — I5042 Chronic combined systolic (congestive) and diastolic (congestive) heart failure: Secondary | ICD-10-CM

## 2013-07-26 DIAGNOSIS — I482 Chronic atrial fibrillation, unspecified: Secondary | ICD-10-CM

## 2013-07-26 DIAGNOSIS — D509 Iron deficiency anemia, unspecified: Secondary | ICD-10-CM

## 2013-07-26 DIAGNOSIS — I10 Essential (primary) hypertension: Secondary | ICD-10-CM

## 2013-07-26 DIAGNOSIS — I4891 Unspecified atrial fibrillation: Secondary | ICD-10-CM

## 2013-07-26 DIAGNOSIS — K5909 Other constipation: Secondary | ICD-10-CM

## 2013-07-26 DIAGNOSIS — I509 Heart failure, unspecified: Secondary | ICD-10-CM

## 2013-07-26 NOTE — Assessment & Plan Note (Signed)
No hanged from prior

## 2013-07-26 NOTE — Progress Notes (Signed)
MRN: [id number] Name: [name]  Sex: female Age: 79 y.o. DOB: 1934/09/19  [place] #: [name] Facility/Room: 219A Level Of Care: SNF Provider: Inocencio Homes D Emergency Contacts: Extended Emergency Contact Information Primary Emergency Contact: Scholle,Reggie Address: [place], [place] [address] [place] Phone: [phone] Work Phone: [phone] Relation: Son Secondary Emergency Contact: Ruiter,[name]          [place], [place] Phone: [phone] Relation: Daughter  Code Status: FULL  Allergies: Levaquin; Penicillins; Tetanus-diphtheria toxoids td; and Latex  Chief Complaint  Patient presents with  . nursing home admission    HPI: Patient is 80 y.o. female who, has bled 2/2 rectal ulcer from chronic constipation who is admitted to SNF for OT/PT for generalized weakness.  Past Medical History  Diagnosis Date  . HTN (hypertension)   . Diverticulosis   . Hemorrhoid   . Osteoarthritis   . Constipation, chronic   . H/O: hysterectomy   . Systolic and diastolic CHF, chronic     a. Echo 07/2011: Technically limited; inferior HK, inferolateral and possibly lateral wall HK, EF 40%, moderate LVE, aortic sclerosis without stenosis, mild MR, mild LAE, question RV dysfunction;   b.  Echo (04/2013): EF 50-55%, normal wall motion, moderate MR, mild to moderate LAE, PASP 37  . Female bladder prolapse     with prolapse uterus, cystocele, s/p TVH/BSO 12/2006  . Obesity (BMI 30-39.9)     wt. 94.1 kg 12/2010  . Moderate mitral regurgitation     a. Mod to Sev by echo 01/2009;  b. mild MR by echo 2013  . Atrial fibrillation 10/13/2011    coumadin Rx  . Iron deficiency anemia     Past Surgical History  Procedure Laterality Date  . Wrist surgery    . Vaginal hysterectomy,cystocele and prolapse  repair  NOV. 2008  . Abdominal hysterectomy    . Colonoscopy N/A 07/18/2013    Procedure: COLONOSCOPY;  Surgeon: Wonda Horner, MD;  Location: [facility];  Service: Endoscopy;  Laterality: N/A;      Medication List       This list is accurate as of: 07/26/13 10:22 PM.  Always use your most recent med list.               bisacodyl 10 MG suppository  Commonly known as:  DULCOLAX  Place 10 mg rectally daily as needed for moderate constipation.     carvedilol 3.125 MG tablet  Commonly known as:  COREG  Take 3.125 mg by mouth 2 (two) times daily with a meal. Hold if SBP <100     feeding supplement (PRO-STAT SUGAR FREE 64) Liqd  Take 30 mLs by mouth 2 (two) times daily. 0800 and 1600     FERRETTS 325 (106 FE) MG Tabs tablet  Generic drug:  ferrous fumarate  Take 1 tablet by mouth 2 (two) times daily. 8am and 4pm     furosemide 20 MG tablet  Commonly known as:  LASIX  Take 60 mg by mouth every morning.     lisinopril 5 MG tablet  Commonly known as:  PRINIVIL,ZESTRIL  Take 2.5 mg by mouth every morning.     lubiprostone 24 MCG capsule  Commonly known as:  AMITIZA  Take 1 capsule (24 mcg total) by mouth 2 (two) times daily with a meal.     magnesium hydroxide 400 MG/5ML suspension  Commonly known  as:  MILK OF MAGNESIA  Take 30 mLs by mouth daily as needed for mild constipation.     omeprazole 20 MG capsule  Commonly known as:  PRILOSEC  Take 20 mg by mouth 2 (two) times daily.     predniSONE 20 MG tablet  Commonly known as:  DELTASONE  Take 40 mg by mouth daily with breakfast.     promethazine 25 MG tablet  Commonly known as:  PHENERGAN  Take 25 mg by mouth every 6 (six) hours as needed for nausea or vomiting.     traMADol 50 MG tablet  Commonly known as:  ULTRAM  Take 1 tablet (50 mg total) by mouth 3 (three) times daily.        No orders of the defined types were placed in this encounter.     There is no immunization history on file for this patient.  History  Substance Use Topics  . Smoking status: Never Smoker   . Smokeless tobacco: Current User    Types: Snuff      Comment: Uses Snuff regularly.  Dips several x/day.  A "large can" can last her up to 3 mos.  . Alcohol Use: No    Family history is noncontributory    Review of Systems  DATA OBTAINED: from patient GENERAL: Feels well no fevers, fatigue, appetite changes SKIN: No itching, rash or wounds EYES: No eye pain, redness, discharge EARS: No earache, tinnitus, change in hearing NOSE: No congestion, drainage or bleeding  MOUTH/THROAT: No mouth or tooth pain,  RESPIRATORY: No cough, wheezing, SOB CARDIAC: No chest pain, palpitations, lower extremity edema  GI: No abdominal pain, No N/V/D or constipation, No heartburn or reflux  GU: No dysuria, frequency or urgency, or incontinence  MUSCULOSKELETAL: No unrelieved bone/joint pain NEUROLOGIC: No headache, dizziness or focal weakness PSYCHIATRIC: No overt anxiety or sadness. Sleeps well. No behavior issue.   Filed Vitals:   07/26/13 2211  BP: 135/76  Pulse: 65  Temp: 97.6 F (36.4 C)  Resp: 18    Physical Exam  GENERAL APPEARANCE: Alert, conversant. Appropriately groomed. No acute distress.  SKIN: No diaphoresis rash, or wounds HEAD: Normocephalic, atraumatic  EYES: Conjunctiva/lids clear. Pupils round, reactive. EOMs intact.  EARS: External exam WNL, canals clear. Hearing grossly normal.  NOSE: No deformity or discharge.  MOUTH/THROAT: Lips w/o lesions RESPIRATORY: Breathing is even, unlabored. Lung sounds are clear   CARDIOVASCULAR: Heart irreg no murmurs, rubs or gallops. No peripheral edema.   GASTROINTESTINAL: Abdomen is soft, non-tender, not distended w/ normal bowel sounds GENITOURINARY: Bladder non tender, not distended  MUSCULOSKELETAL: No abnormal joints or musculature NEUROLOGIC: Oriented X3. Cranial nerves 2-12 grossly intact. Moves all extremities no tremor. PSYCHIATRIC: Mood and affect appropriate to situation, no behavioral issues  Patient Active Problem List   Diagnosis Date Noted  . GI bleed 07/13/2013  .  History of lower GI bleeding 07/11/2013  . AKI (acute kidney injury) 06/29/2013  . Acute lower GI bleeding 06/23/2013  . PNA (pneumonia) 06/19/2013  . Rash and nonspecific skin eruption 06/19/2013  . Chronic respiratory failure with hypoxia 06/18/2013  . Acute encephalopathy 06/07/2013  . Diastolic CHF 25/36/6440  . Pulmonary hypertension 06/05/2013  . Weakness 06/03/2013  . Encounter for therapeutic drug monitoring 04/10/2013  . Long term current use of anticoagulant 10/26/2011  . Hypokalemia 10/15/2011  . Abdominal pain 10/15/2011  . Iron deficiency anemia 10/15/2011  . Bladder prolapse, female, acquired 10/15/2011  . Atrial fibrillation 10/15/2011  . Acute on chronic  combined systolic and diastolic congestive heart failure 07/22/2011  . Facial edema 12/30/2010  . Leukopenia 12/30/2010  . Neutropenia 12/30/2010  . Abdominal pain, unspecified site 12/27/2010  . Chronic pain 12/25/2010  . Hematochezia 12/25/2010  . Dehydration 12/25/2010  . Thrombocytosis 12/25/2010  . Hyponatremia 12/25/2010  . Systolic and diastolic CHF, chronic   . Female bladder prolapse   . Mitral regurgitation 06/04/2010  . HYPOPOTASSEMIA 09/24/2009  . COMBINED HEART FAILURE, CHRONIC 09/09/2009  . CARDIOMYOPATHY 03/04/2009  . ANASARCA 03/04/2009  . HYPERTENSION 02/20/2009  . CONSTIPATION, CHRONIC 02/20/2009  . DIVERTICULOSIS 02/20/2009    CBC    Component Value Date/Time   WBC 6.0 07/18/2013 0454   RBC 3.26* 07/18/2013 0454   RBC 1.95* 06/23/2013 1118   HGB 9.9* 07/18/2013 0454   HCT 31.7* 07/18/2013 0454   PLT 286 07/18/2013 0454   MCV 97.2 07/18/2013 0454   LYMPHSABS 1.5 07/13/2013 1325   MONOABS 0.4 07/13/2013 1325   EOSABS 0.2 07/13/2013 1325   BASOSABS 0.0 07/13/2013 1325    CMP     Component Value Date/Time   NA 137 07/18/2013 0454   K 4.0 07/18/2013 0454   CL 99 07/18/2013 0454   CO2 29 07/18/2013 0454   GLUCOSE 72 07/18/2013 0454   BUN 16 07/18/2013 0454   CREATININE 0.82 07/18/2013 0454    CALCIUM 8.3* 07/18/2013 0454   PROT 5.0* 07/14/2013 0541   ALBUMIN 2.4* 07/14/2013 0541   AST 8 07/14/2013 0541   ALT 9 07/14/2013 0541   ALKPHOS 79 07/14/2013 0541   BILITOT 0.3 07/14/2013 0541   GFRNONAA 68* 07/18/2013 0454   GFRAA 79* 07/18/2013 0454    Assessment and Plan  GI bleed Patient had colonoscopy while in house performed by Dr.Ganem who recommended the following:  I think she can be discharged back to the nursing home. I would recommend a therapeutic trial of a trial of Amitiza 24 micrograms by mouth twice a day to try to prevent constipation, which is why she has obviously developed this distal rectal stercoral ulcer. Biopsies will be checked.  - Patient is to followup with GI for further recommendations and plans on when to restart Coumadin. If patient felt to be candidate for restarting this medicine   CONSTIPATION, CHRONIC Source of ulcer-amitiza started  Systolic and diastolic CHF, chronic No hanged from prior  Atrial fibrillation Rate controoled with coreg;again not candidate for anticoag now, maybe never  HYPERTENSION Same as CHF  Iron deficiency anemia Hb holding 9.5-10; continue iron    Hennie Duos, MD

## 2013-07-26 NOTE — Assessment & Plan Note (Signed)
Source of ulcer-amitiza started

## 2013-07-26 NOTE — Assessment & Plan Note (Signed)
Hb holding 9.5-10; continue iron

## 2013-07-26 NOTE — Assessment & Plan Note (Signed)
Rate controoled with coreg;again not candidate for anticoag now, maybe never

## 2013-07-26 NOTE — Assessment & Plan Note (Signed)
Same as CHF

## 2013-07-26 NOTE — Assessment & Plan Note (Signed)
Patient had colonoscopy while in house performed by Dr.Ganem who recommended the following:  I think she can be discharged back to the nursing home. I would recommend a therapeutic trial of a trial of Amitiza 24 micrograms by mouth twice a day to try to prevent constipation, which is why she has obviously developed this distal rectal stercoral ulcer. Biopsies will be checked.  - Patient is to followup with GI for further recommendations and plans on when to restart Coumadin. If patient felt to be candidate for restarting this medicine

## 2013-07-30 ENCOUNTER — Other Ambulatory Visit: Payer: Self-pay | Admitting: *Deleted

## 2013-07-30 MED ORDER — TRAMADOL HCL 50 MG PO TABS: ORAL_TABLET | ORAL | Status: DC

## 2013-07-30 NOTE — Telephone Encounter (Signed)
[facility] 

## 2013-08-04 ENCOUNTER — Emergency Department (HOSPITAL_COMMUNITY): Payer: Medicare Other

## 2013-08-04 ENCOUNTER — Inpatient Hospital Stay (HOSPITAL_COMMUNITY)
Admission: EM | Admit: 2013-08-04 | Discharge: 2013-08-10 | DRG: 291 | Disposition: A | Discharge status: Skilled Nursing Facility | Payer: Medicare Other | Attending: Internal Medicine | Admitting: Internal Medicine

## 2013-08-04 ENCOUNTER — Encounter (HOSPITAL_COMMUNITY): Payer: Self-pay | Admitting: Emergency Medicine

## 2013-08-04 DIAGNOSIS — I10 Essential (primary) hypertension: Secondary | ICD-10-CM | POA: Diagnosis present

## 2013-08-04 DIAGNOSIS — E669 Obesity, unspecified: Secondary | ICD-10-CM | POA: Diagnosis present

## 2013-08-04 DIAGNOSIS — D638 Anemia in other chronic diseases classified elsewhere: Secondary | ICD-10-CM | POA: Diagnosis present

## 2013-08-04 DIAGNOSIS — I509 Heart failure, unspecified: Secondary | ICD-10-CM | POA: Diagnosis present

## 2013-08-04 DIAGNOSIS — K219 Gastro-esophageal reflux disease without esophagitis: Secondary | ICD-10-CM | POA: Diagnosis present

## 2013-08-04 DIAGNOSIS — Z79899 Other long term (current) drug therapy: Secondary | ICD-10-CM | POA: Diagnosis not present

## 2013-08-04 DIAGNOSIS — I5043 Acute on chronic combined systolic (congestive) and diastolic (congestive) heart failure: Secondary | ICD-10-CM | POA: Diagnosis present

## 2013-08-04 DIAGNOSIS — R0602 Shortness of breath: Secondary | ICD-10-CM | POA: Diagnosis present

## 2013-08-04 DIAGNOSIS — I4891 Unspecified atrial fibrillation: Secondary | ICD-10-CM | POA: Diagnosis present

## 2013-08-04 DIAGNOSIS — J9600 Acute respiratory failure, unspecified whether with hypoxia or hypercapnia: Secondary | ICD-10-CM | POA: Diagnosis present

## 2013-08-04 DIAGNOSIS — G8929 Other chronic pain: Secondary | ICD-10-CM

## 2013-08-04 DIAGNOSIS — Z6841 Body Mass Index (BMI) 40.0 and over, adult: Secondary | ICD-10-CM | POA: Diagnosis not present

## 2013-08-04 DIAGNOSIS — N179 Acute kidney failure, unspecified: Secondary | ICD-10-CM

## 2013-08-04 DIAGNOSIS — E871 Hypo-osmolality and hyponatremia: Secondary | ICD-10-CM

## 2013-08-04 DIAGNOSIS — G9340 Encephalopathy, unspecified: Secondary | ICD-10-CM

## 2013-08-04 DIAGNOSIS — J9611 Chronic respiratory failure with hypoxia: Secondary | ICD-10-CM

## 2013-08-04 DIAGNOSIS — I059 Rheumatic mitral valve disease, unspecified: Secondary | ICD-10-CM | POA: Diagnosis present

## 2013-08-04 DIAGNOSIS — I5041 Acute combined systolic (congestive) and diastolic (congestive) heart failure: Secondary | ICD-10-CM | POA: Diagnosis present

## 2013-08-04 DIAGNOSIS — E876 Hypokalemia: Secondary | ICD-10-CM

## 2013-08-04 DIAGNOSIS — D509 Iron deficiency anemia, unspecified: Secondary | ICD-10-CM

## 2013-08-04 DIAGNOSIS — Z8719 Personal history of other diseases of the digestive system: Secondary | ICD-10-CM

## 2013-08-04 DIAGNOSIS — R531 Weakness: Secondary | ICD-10-CM

## 2013-08-04 LAB — CBC
HEMATOCRIT: 31.6 % — AB (ref 36.0–46.0)
HEMOGLOBIN: 10 g/dL — AB (ref 12.0–15.0)
MCH: 31.1 pg (ref 26.0–34.0)
MCHC: 31.6 g/dL (ref 30.0–36.0)
MCV: 98.1 fL (ref 78.0–100.0)
Platelets: 277 10*3/uL (ref 150–400)
RBC: 3.22 MIL/uL — ABNORMAL LOW (ref 3.87–5.11)
RDW: 16.7 % — ABNORMAL HIGH (ref 11.5–15.5)
WBC: 13.2 10*3/uL — ABNORMAL HIGH (ref 4.0–10.5)

## 2013-08-04 LAB — URINALYSIS, ROUTINE W REFLEX MICROSCOPIC
BILIRUBIN URINE: NEGATIVE
Glucose, UA: NEGATIVE mg/dL
Hgb urine dipstick: NEGATIVE
KETONES UR: NEGATIVE mg/dL
Leukocytes, UA: NEGATIVE
NITRITE: NEGATIVE
PH: 5.5 (ref 5.0–8.0)
Protein, ur: NEGATIVE mg/dL
Specific Gravity, Urine: 1.018 (ref 1.005–1.030)
Urobilinogen, UA: 0.2 mg/dL (ref 0.0–1.0)

## 2013-08-04 LAB — I-STAT CHEM 8, ED
BUN: 27 mg/dL — AB (ref 6–23)
CHLORIDE: 106 meq/L (ref 96–112)
CREATININE: 1.1 mg/dL (ref 0.50–1.10)
Calcium, Ion: 1.03 mmol/L — ABNORMAL LOW (ref 1.13–1.30)
Glucose, Bld: 202 mg/dL — ABNORMAL HIGH (ref 70–99)
HCT: 34 % — ABNORMAL LOW (ref 36.0–46.0)
Hemoglobin: 11.6 g/dL — ABNORMAL LOW (ref 12.0–15.0)
POTASSIUM: 3.7 meq/L (ref 3.7–5.3)
SODIUM: 130 meq/L — AB (ref 137–147)
TCO2: 22 mmol/L (ref 0–100)

## 2013-08-04 LAB — CBC WITH DIFFERENTIAL/PLATELET
BASOS ABS: 0 10*3/uL (ref 0.0–0.1)
Basophils Relative: 0 % (ref 0–1)
Eosinophils Absolute: 0.1 10*3/uL (ref 0.0–0.7)
Eosinophils Relative: 0 % (ref 0–5)
HCT: 32.5 % — ABNORMAL LOW (ref 36.0–46.0)
Hemoglobin: 10.1 g/dL — ABNORMAL LOW (ref 12.0–15.0)
LYMPHS ABS: 1.4 10*3/uL (ref 0.7–4.0)
LYMPHS PCT: 7 % — AB (ref 12–46)
MCH: 30.3 pg (ref 26.0–34.0)
MCHC: 31.1 g/dL (ref 30.0–36.0)
MCV: 97.6 fL (ref 78.0–100.0)
Monocytes Absolute: 0.3 10*3/uL (ref 0.1–1.0)
Monocytes Relative: 2 % — ABNORMAL LOW (ref 3–12)
NEUTROS ABS: 18.1 10*3/uL — AB (ref 1.7–7.7)
NEUTROS PCT: 91 % — AB (ref 43–77)
PLATELETS: 308 10*3/uL (ref 150–400)
RBC: 3.33 MIL/uL — AB (ref 3.87–5.11)
RDW: 16.7 % — AB (ref 11.5–15.5)
WBC: 19.9 10*3/uL — AB (ref 4.0–10.5)

## 2013-08-04 LAB — I-STAT TROPONIN, ED: Troponin i, poc: 0.02 ng/mL (ref 0.00–0.08)

## 2013-08-04 LAB — PRO B NATRIURETIC PEPTIDE: PRO B NATRI PEPTIDE: 4718 pg/mL — AB (ref 0–450)

## 2013-08-04 LAB — TSH: TSH: 2.5 u[IU]/mL (ref 0.350–4.500)

## 2013-08-04 LAB — CREATININE, SERUM
Creatinine, Ser: 0.96 mg/dL (ref 0.50–1.10)
GFR, EST AFRICAN AMERICAN: 65 mL/min — AB (ref >90)
GFR, EST NON AFRICAN AMERICAN: 56 mL/min — AB (ref >90)

## 2013-08-04 LAB — PROTIME-INR
INR: 1.03 (ref 0.00–1.49)
Prothrombin Time: 13.5 seconds (ref 11.6–15.2)

## 2013-08-04 MED ORDER — CARVEDILOL 3.125 MG PO TABS
3.1250 mg | ORAL_TABLET | Freq: Two times a day (BID) | ORAL | Status: DC
  Administered 2013-08-04 – 2013-08-10 (×13): 3.125 mg via ORAL
  Filled 2013-08-04 (×15): qty 1

## 2013-08-04 MED ORDER — BISACODYL 10 MG RE SUPP
10.0000 mg | Freq: Every day | RECTAL | Status: DC | PRN
  Administered 2013-08-06: 10 mg via RECTAL
  Filled 2013-08-04: qty 1

## 2013-08-04 MED ORDER — ONDANSETRON HCL 4 MG PO TABS: 4.0000 mg | ORAL_TABLET | Freq: Four times a day (QID) | ORAL | Status: DC | PRN

## 2013-08-04 MED ORDER — BIOTENE DRY MOUTH MT LIQD
15.0000 mL | Freq: Two times a day (BID) | OROMUCOSAL | Status: DC
  Administered 2013-08-05 – 2013-08-10 (×9): 15 mL via OROMUCOSAL

## 2013-08-04 MED ORDER — ONDANSETRON HCL 4 MG/2ML IJ SOLN
4.0000 mg | Freq: Four times a day (QID) | INTRAMUSCULAR | Status: DC | PRN
  Filled 2013-08-04: qty 2

## 2013-08-04 MED ORDER — FUROSEMIDE 10 MG/ML IJ SOLN
60.0000 mg | Freq: Once | INTRAMUSCULAR | Status: AC
  Administered 2013-08-04: 60 mg via INTRAVENOUS
  Filled 2013-08-04: qty 6

## 2013-08-04 MED ORDER — ENOXAPARIN SODIUM 40 MG/0.4ML ~~LOC~~ SOLN
40.0000 mg | Freq: Every day | SUBCUTANEOUS | Status: DC
  Administered 2013-08-04 – 2013-08-10 (×7): 40 mg via SUBCUTANEOUS
  Filled 2013-08-04 (×7): qty 0.4

## 2013-08-04 MED ORDER — LUBIPROSTONE 24 MCG PO CAPS
24.0000 ug | ORAL_CAPSULE | Freq: Two times a day (BID) | ORAL | Status: DC
  Administered 2013-08-04 – 2013-08-10 (×12): 24 ug via ORAL
  Filled 2013-08-04 (×15): qty 1

## 2013-08-04 MED ORDER — LEVALBUTEROL HCL 0.63 MG/3ML IN NEBU
0.6300 mg | INHALATION_SOLUTION | Freq: Four times a day (QID) | RESPIRATORY_TRACT | Status: DC | PRN
Start: 2013-08-04 — End: 2013-08-10

## 2013-08-04 MED ORDER — PANTOPRAZOLE SODIUM 40 MG PO TBEC
40.0000 mg | DELAYED_RELEASE_TABLET | Freq: Every day | ORAL | Status: DC
  Administered 2013-08-04 – 2013-08-08 (×5): 40 mg via ORAL
  Filled 2013-08-04 (×5): qty 1

## 2013-08-04 MED ORDER — FUROSEMIDE 10 MG/ML IJ SOLN
40.0000 mg | Freq: Two times a day (BID) | INTRAMUSCULAR | Status: DC
  Administered 2013-08-04 – 2013-08-05 (×4): 40 mg via INTRAVENOUS
  Filled 2013-08-04 (×7): qty 4

## 2013-08-04 MED ORDER — ALUM & MAG HYDROXIDE-SIMETH 200-200-20 MG/5ML PO SUSP
30.0000 mL | Freq: Four times a day (QID) | ORAL | Status: DC | PRN
  Administered 2013-08-04: 30 mL via ORAL
  Filled 2013-08-04: qty 30

## 2013-08-04 MED ORDER — PREDNISONE 20 MG PO TABS
40.0000 mg | ORAL_TABLET | Freq: Every day | ORAL | Status: DC
  Administered 2013-08-04 – 2013-08-10 (×7): 40 mg via ORAL
  Filled 2013-08-04 (×8): qty 2

## 2013-08-04 MED ORDER — TRAMADOL HCL 50 MG PO TABS
50.0000 mg | ORAL_TABLET | Freq: Two times a day (BID) | ORAL | Status: DC | PRN
  Administered 2013-08-04 – 2013-08-08 (×7): 50 mg via ORAL
  Filled 2013-08-04 (×9): qty 1

## 2013-08-04 NOTE — ED Notes (Signed)
Patient is resting comfortably. 

## 2013-08-04 NOTE — ED Notes (Addendum)
According to family, "pt. Woke up out of sleep yelling for Mylanta and ginger ale. Pt. Is to have o2 @ 2l at night but has been non-compliant. sao2s in 70's when staff went to room.  Also, pt. Is taking 60 mg of lasix now instead of 80 mg of lasix."

## 2013-08-04 NOTE — ED Notes (Signed)
MD at bedside. 

## 2013-08-04 NOTE — H&P (Addendum)
[facility] History and Physical  [name] OVF:[id number] DOB: [birth year] DOA: 08/04/2013  Referring physician:   PCP: Osborne Casco, MD   Chief Complaint: Shortness of breath   HPI:  79 year old female, nursing home resident with history of combined systolic and diastolic congestive heart failure, atrial fibrillation not a candidate for long-term anticoagulation due to recent GI bleeding with hospitalization for pneumonia, hypertension presents to the ER from the nursing home with shortness of breath. The patient is unable to provide any meaningful history and the patient's daughter Enid Derry bedside. She is also the POA. According to her the patient has had changes made in her medications were decreased and her ACE inhibitor and her Lasix, because of low blood pressure chronically and over the last week or so the patient has gained about 10 pounds. She has a mild nonproductive cough dependent edema which has been worsening over the last 2 days. The patient is nonambulatory since April, she can pivot from bed to chair.  In the ER the patient was placed on BiPAP, received IV Lasix. Currently off BiPAP and maintaining oxygen saturation on nasal cannula. Chest x-ray consistent with CHF exacerbation Most recent 2-D echo was 3/15 EF of 50-55% moderate mitral regurgitation   Review of Systems: negative for the following  Constitutional: Denies fever, chills, diaphoresis, appetite change and fatigue.  HEENT: Denies photophobia, eye pain, redness, hearing loss, ear pain, congestion, sore throat, rhinorrhea, sneezing, mouth sores, trouble swallowing, neck pain, neck stiffness and tinnitus.  Respiratory: Positive for SOB, DOE, cough, chest tightness, and wheezing.  Cardiovascular: Denies chest pain, palpitations and leg swelling.  Gastrointestinal: Denies nausea, vomiting, abdominal pain, diarrhea, constipation, blood in stool and abdominal distention.  Genitourinary: Denies  dysuria, urgency, frequency, hematuria, flank pain and difficulty urinating.  Musculoskeletal: Denies myalgias, back pain, joint swelling, arthralgias and gait problem.  Skin: Denies pallor, rash and wound.  Neurological: Denies dizziness, seizures, syncope, weakness, light-headedness, numbness and headaches.  Hematological: Denies adenopathy. Easy bruising, personal or family bleeding history  Psychiatric/Behavioral: Denies suicidal ideation, mood changes, confusion, nervousness, sleep disturbance and agitation       Past Medical History  Diagnosis Date  . HTN (hypertension)   . Diverticulosis   . Hemorrhoid   . Osteoarthritis   . Constipation, chronic   . H/O: hysterectomy   . Systolic and diastolic CHF, chronic     a. Echo 07/2011: Technically limited; inferior HK, inferolateral and possibly lateral wall HK, EF 40%, moderate LVE, aortic sclerosis without stenosis, mild MR, mild LAE, question RV dysfunction;   b.  Echo (04/2013): EF 50-55%, normal wall motion, moderate MR, mild to moderate LAE, PASP 37  . Female bladder prolapse     with prolapse uterus, cystocele, s/p TVH/BSO 12/2006  . Obesity (BMI 30-39.9)     wt. 94.1 kg 12/2010  . Moderate mitral regurgitation     a. Mod to Sev by echo 01/2009;  b. mild MR by echo 2013  . Atrial fibrillation 10/13/2011    coumadin Rx  . Iron deficiency anemia      Past Surgical History  Procedure Laterality Date  . Wrist surgery    . Vaginal hysterectomy,cystocele and prolapse  repair  NOV. 2008  . Abdominal hysterectomy    . Colonoscopy N/A 07/18/2013    Procedure: COLONOSCOPY;  Surgeon: Wonda Horner, MD;  Location: [facility];  Service: Endoscopy;  Laterality: N/A;      Social History:  reports that she has never smoked. Her smokeless tobacco  use includes Snuff. She reports that she does not drink alcohol or use illicit drugs.   Allergies  Allergen Reactions  . Levaquin [Levofloxacin] Other (See Comments)    Per MAR  .  Penicillins Other (See Comments)    Per MAR  . Tetanus-Diphtheria Toxoids Td Other (See Comments)    Per MAR  . Latex Hives and Rash    Family History  Problem Relation Age of Onset  . Diabetes Brother   . Hypertension Mother     deceased @ 34  . Leukemia Brother   . Cancer Brother   . Stroke Mother   . Diabetes Father     deceased in his 69's     Prior to Admission medications   Medication Sig Start Date End Date Taking? Authorizing Polk Minor  Amino Acids-Protein Hydrolys (FEEDING SUPPLEMENT, PRO-STAT SUGAR FREE 64,) LIQD Take 30 mLs by mouth 2 (two) times daily. 0800 and 1600    Historical Trishia Cuthrell, MD  bisacodyl (DULCOLAX) 10 MG suppository Place 10 mg rectally daily as needed for moderate constipation.    Historical Diana Armijo, MD  carvedilol (COREG) 3.125 MG tablet Take 3.125 mg by mouth 2 (two) times daily with a meal. Hold if SBP <100    Historical Ikeem Cleckler, MD  ferrous fumarate (FERRETTS) 325 (106 FE) MG TABS Take 1 tablet by mouth 2 (two) times daily. 8am and 4pm    Historical Destry Dauber, MD  furosemide (LASIX) 20 MG tablet Take 60 mg by mouth every morning.    Historical Brennley Curtice, MD  lisinopril (PRINIVIL,ZESTRIL) 5 MG tablet Take 2.5 mg by mouth every morning.    Historical Maxximus Gotay, MD  lubiprostone (AMITIZA) 24 MCG capsule Take 1 capsule (24 mcg total) by mouth 2 (two) times daily with a meal. 07/19/13   Velvet Bathe, MD  magnesium hydroxide (MILK OF MAGNESIA) 400 MG/5ML suspension Take 30 mLs by mouth daily as needed for mild constipation.    Historical Omega Slager, MD  omeprazole (PRILOSEC) 20 MG capsule Take 20 mg by mouth 2 (two) times daily.     Historical Hektor Huston, MD  predniSONE (DELTASONE) 20 MG tablet Take 40 mg by mouth daily with breakfast.    Historical Shantoya Geurts, MD  promethazine (PHENERGAN) 25 MG tablet Take 25 mg by mouth every 6 (six) hours as needed for nausea or vomiting.    Historical Averil Digman, MD  traMADol (ULTRAM) 50 MG tablet Take one tablet by mouth three  times daily around the clock. Hold for sedation 07/30/13   Gayland Curry, DO     Physical Exam: Filed Vitals:   08/04/13 0545 08/04/13 0600 08/04/13 0635 08/04/13 0700  BP: 111/56 105/70  100/62  Pulse: 98 96  95  Temp:      TempSrc:      Resp:    14  SpO2: 100% 100% 100% 99%   General: Morbidly obese and chronically ill-appearing, NAD, appears stated age  HEENT: NCAT, PERRLA, EOMI, Anicteic Sclera, mucous membranes moist.  Neck: Supple, no JVD, no masses  Cardiovascular: S1 S2 auscultated, 2/6 SEM, RRR  Respiratory: Clear to auscultation bilaterally with equal chest rise  Abdomen: Soft, obese, diffusely tender, nondistended, + bowel sounds  GU/Rectal: External hemorrhoid  Extremities: warm dry without cyanosis clubbing. Trace LE edema B/L  Neuro: AAOx1, cranial nerves grossly intact.  Skin: Without rashes exudates or nodules  Psych: Normal affect and demeanor with intact judgement and insight       Labs on Admission:    Basic Metabolic Panel:  Recent Labs  Lab 08/04/13 0327  NA 130*  K 3.7  CL 106  GLUCOSE 202*  BUN 27*  CREATININE 1.10   Liver Function Tests: No results found for this basename: AST, ALT, ALKPHOS, BILITOT, PROT, ALBUMIN,  in the last 168 hours No results found for this basename: LIPASE, AMYLASE,  in the last 168 hours No results found for this basename: AMMONIA,  in the last 168 hours CBC:  Recent Labs Lab 08/04/13 0322 08/04/13 0327  WBC 19.9*  --   NEUTROABS 18.1*  --   HGB 10.1* 11.6*  HCT 32.5* 34.0*  MCV 97.6  --   PLT 308  --    Cardiac Enzymes: No results found for this basename: CKTOTAL, CKMB, CKMBINDEX, TROPONINI,  in the last 168 hours  BNP (last 3 results)  Recent Labs  06/03/13 1825 06/22/13 0621 08/04/13 0322  PROBNP 27115.0* 6129.0* 4718.0*      CBG: No results found for this basename: GLUCAP,  in the last 168 hours  Radiological Exams on Admission: Dg Chest Port 1 View  08/04/2013   CLINICAL DATA:   Shortness of breath  EXAM: PORTABLE CHEST - 1 VIEW  COMPARISON:  06/19/2013  FINDINGS: Chronic cardiomegaly and mild aortic tortuosity. There is new diffuse interstitial coarsening compatible with pulmonary edema. There is chronic elevation the left diaphragm with streaky left mid and lower lung opacities, most consistent with atelectasis. No effusion or pneumothorax.  IMPRESSION: 1. CHF. 2. Left basilar atelectasis.   Electronically Signed   By: Jorje Guild M.D.   On: 08/04/2013 04:35    EKG: Independently reviewed. EKG Interpretation  Date/Time: Saturday August 04 2013 03:13:33 EDT  Ventricular Rate: 115  PR Interval: 143  QRS Duration: 126  QT Interval: 343  QTC Calculation: 474  R Axis: -25   Assessment/Plan Active Problems:   CHF (congestive heart failure)   Congestive heart failure, NYHA class 4   Acute on chronic CHF exacerbation-combined systolic diastolic heart failure Patient is being admitted to telemetry Will start the patient on IV Lasix on a regular basis Monitor creatinine closely In the setting of low blood pressure we'll hold lisinopril According to the daughter the patient has also been on intermittent Zaroxolyn Daily weights and I. and O Fluid restriction Patient of Dr. Jeneen Rinks Hochrein     Acute anemia  -Likely secondary to anemia of chronic disease   -Currently hemoglobin is at baseline of 11.6 Ferrous sulfate outpatient Recent colonoscopy showed bleeding stercoral ulcer   Chronic atrial fib  -Currently rate controlled  -On no anticoagulation due to to recent GI bleeding  Hypertension Blood pressure tends to run low as mentioned above Continue diuresis with Lasix and monitor   Code Status:   full Family Communication: bedside Disposition Plan: Discharge to [place] in one to 2 days   Time spent: 70 mins   [facility] Pager [phone]  If 7PM-7AM, please contact night-coverage [email or web address] Password [facility] 08/04/2013,  8:03 AM

## 2013-08-04 NOTE — ED Notes (Signed)
Family at bedside. 

## 2013-08-04 NOTE — ED Notes (Signed)
Pt. Taken off bipap and put on 2l o2 via [place].

## 2013-08-04 NOTE — ED Notes (Signed)
Phlebotomy at the bedside  

## 2013-08-04 NOTE — ED Provider Notes (Signed)
CSN: [id number]     Arrival date & time 08/04/13  0305 History   First MD Initiated Contact with Patient 08/04/13 0309     Chief Complaint  Patient presents with  . Respiratory Distress     (Consider location/radiation/quality/duration/timing/severity/associated sxs/prior Treatment) The history is provided by the patient, the nursing home and the EMS personnel.   patient presents for shortness of breath. Brought in from nursing home since in the 70s. He had some confusion. Has had a mild cough. Has had swelling or legs. History of chronic systolic and diastolic congestive heart failure. There is reports of her being on hospice, although she is a full code. Brought in on CPAP by EMS.  Past Medical History  Diagnosis Date  . HTN (hypertension)   . Diverticulosis   . Hemorrhoid   . Osteoarthritis   . Constipation, chronic   . H/O: hysterectomy   . Systolic and diastolic CHF, chronic     a. Echo 07/2011: Technically limited; inferior HK, inferolateral and possibly lateral wall HK, EF 40%, moderate LVE, aortic sclerosis without stenosis, mild MR, mild LAE, question RV dysfunction;   b.  Echo (04/2013): EF 50-55%, normal wall motion, moderate MR, mild to moderate LAE, PASP 37  . Female bladder prolapse     with prolapse uterus, cystocele, s/p TVH/BSO 12/2006  . Obesity (BMI 30-39.9)     wt. 94.1 kg 12/2010  . Moderate mitral regurgitation     a. Mod to Sev by echo 01/2009;  b. mild MR by echo 2013  . Atrial fibrillation 10/13/2011    coumadin Rx  . Iron deficiency anemia    Past Surgical History  Procedure Laterality Date  . Wrist surgery    . Vaginal hysterectomy,cystocele and prolapse  repair  NOV. 2008  . Abdominal hysterectomy    . Colonoscopy N/A 07/18/2013    Procedure: COLONOSCOPY;  Surgeon: Wonda Horner, MD;  Location: [facility];  Service: Endoscopy;  Laterality: N/A;   Family History  Problem Relation Age of Onset  . Diabetes Brother   . Hypertension Mother    deceased @ 26  . Leukemia Brother   . Cancer Brother   . Stroke Mother   . Diabetes Father     deceased in his 30's   History  Substance Use Topics  . Smoking status: Never Smoker   . Smokeless tobacco: Current User    Types: Snuff     Comment: Uses Snuff regularly.  Dips several x/day.  A "large can" can last her up to 3 mos.  . Alcohol Use: No   OB History   Grav Para Term Preterm Abortions TAB SAB Ect Mult Living   6 6             Review of Systems  Unable to perform ROS     Allergies  Levaquin; Penicillins; Tetanus-diphtheria toxoids td; and Latex  Home Medications   Prior to Admission medications   Medication Sig Start Date End Date Taking? Authorizing Warda Mcqueary  Amino Acids-Protein Hydrolys (FEEDING SUPPLEMENT, PRO-STAT SUGAR FREE 64,) LIQD Take 30 mLs by mouth 2 (two) times daily. 0800 and 1600    Historical Armani Gawlik, MD  bisacodyl (DULCOLAX) 10 MG suppository Place 10 mg rectally daily as needed for moderate constipation.    Historical Audi Conover, MD  carvedilol (COREG) 3.125 MG tablet Take 3.125 mg by mouth 2 (two) times daily with a meal. Hold if SBP <100    Historical Carmin Alvidrez, MD  ferrous fumarate (FERRETTS) 325 (  106 FE) MG TABS Take 1 tablet by mouth 2 (two) times daily. 8am and 4pm    Historical Jenavie Stanczak, MD  furosemide (LASIX) 20 MG tablet Take 60 mg by mouth every morning.    Historical Samuel Rittenhouse, MD  lisinopril (PRINIVIL,ZESTRIL) 5 MG tablet Take 2.5 mg by mouth every morning.    Historical Reisha Wos, MD  lubiprostone (AMITIZA) 24 MCG capsule Take 1 capsule (24 mcg total) by mouth 2 (two) times daily with a meal. 07/19/13   Velvet Bathe, MD  magnesium hydroxide (MILK OF MAGNESIA) 400 MG/5ML suspension Take 30 mLs by mouth daily as needed for mild constipation.    Historical Kamir Selover, MD  omeprazole (PRILOSEC) 20 MG capsule Take 20 mg by mouth 2 (two) times daily.     Historical Maily Debarge, MD  predniSONE (DELTASONE) 20 MG tablet Take 40 mg by mouth daily with  breakfast.    Historical Fredric Slabach, MD  promethazine (PHENERGAN) 25 MG tablet Take 25 mg by mouth every 6 (six) hours as needed for nausea or vomiting.    Historical Mikita Lesmeister, MD  traMADol (ULTRAM) 50 MG tablet Take one tablet by mouth three times daily around the clock. Hold for sedation 07/30/13   Tiffany L Reed, DO   BP 100/62  Pulse 95  Temp(Src) 99.5 F (37.5 C) (Rectal)  Resp 14  SpO2 99% Physical Exam  Nursing note and vitals reviewed. Constitutional: She appears well-developed.  Patient is morbidly obese  HENT:  Head: Normocephalic.  Eyes: Pupils are equal, round, and reactive to light.  Cardiovascular: Normal rate and regular rhythm.   Pulmonary/Chest:  Diffuse harsh breath sounds  Abdominal: There is no tenderness.  Musculoskeletal: She exhibits edema.  Bilateral lower extremity pitting edema  Neurological: She is alert.  Skin: No rash noted.    ED Course  Procedures (including critical care time) Labs Review Labs Reviewed  CBC WITH DIFFERENTIAL - Abnormal; Notable for the following:    WBC 19.9 (*)    RBC 3.33 (*)    Hemoglobin 10.1 (*)    HCT 32.5 (*)    RDW 16.7 (*)    Neutrophils Relative % 91 (*)    Neutro Abs 18.1 (*)    Lymphocytes Relative 7 (*)    Monocytes Relative 2 (*)    All other components within normal limits  PRO B NATRIURETIC PEPTIDE - Abnormal; Notable for the following:    Pro B Natriuretic peptide (BNP) 4718.0 (*)    All other components within normal limits  URINALYSIS, ROUTINE W REFLEX MICROSCOPIC - Abnormal; Notable for the following:    APPearance CLOUDY (*)    All other components within normal limits  I-STAT CHEM 8, ED - Abnormal; Notable for the following:    Sodium 130 (*)    BUN 27 (*)    Glucose, Bld 202 (*)    Calcium, Ion 1.03 (*)    Hemoglobin 11.6 (*)    HCT 34.0 (*)    All other components within normal limits  PROTIME-INR  Randolm Idol, ED    Imaging Review Dg Chest Port 1 View  08/04/2013   CLINICAL DATA:   Shortness of breath  EXAM: PORTABLE CHEST - 1 VIEW  COMPARISON:  06/19/2013  FINDINGS: Chronic cardiomegaly and mild aortic tortuosity. There is new diffuse interstitial coarsening compatible with pulmonary edema. There is chronic elevation the left diaphragm with streaky left mid and lower lung opacities, most consistent with atelectasis. No effusion or pneumothorax.  IMPRESSION: 1. CHF. 2. Left basilar atelectasis.  Electronically Signed   By: Jorje Guild M.D.   On: 08/04/2013 04:35     EKG Interpretation   Date/Time:  Saturday August 04 2013 03:13:33 EDT Ventricular Rate:  115 PR Interval:  143 QRS Duration: 126 QT Interval:  343 QTC Calculation: 474 R Axis:   -58 Text Interpretation:  Sinus or ectopic atrial tachycardia Sinus pause  IVCD, consider atypical RBBB LVH with IVCD, LAD and secondary repol abnrm  Inferior infarct, acute Lateral leads are also involved Confirmed by  PICKERING  MD, NATHAN [phone]) on 08/04/2013 6:37:57 AM      MDM   Final diagnoses:  Acute on chronic combined systolic and diastolic congestive heart failure    Patient with shortness of breath. Had been hypoxic at the nursing home. Sats in the 70s. Improved after BiPAP. Will give Lasix. White count elevated and unknown if she is still on steroids. Will admit to internal medicine. She is now off of bypapp.    Jasper Riling. Alvino Chapel, MD 08/04/13 [phone]

## 2013-08-04 NOTE — ED Notes (Signed)
MD at the bedside  

## 2013-08-04 NOTE — ED Notes (Signed)
From [facility]: When staff gave albuterol treatment, pt. Became combative and went into resp. Distress. Initial sao2's 70's. Pt. Is considered hospice but facility was unable to produce paperwork. Pt. Is a full code. Alert. Pt. On CPAP per EMS

## 2013-08-05 DIAGNOSIS — G8929 Other chronic pain: Secondary | ICD-10-CM

## 2013-08-05 DIAGNOSIS — I509 Heart failure, unspecified: Secondary | ICD-10-CM

## 2013-08-05 DIAGNOSIS — E871 Hypo-osmolality and hyponatremia: Secondary | ICD-10-CM

## 2013-08-05 LAB — CBC WITH DIFFERENTIAL/PLATELET
BASOS PCT: 0 % (ref 0–1)
Basophils Absolute: 0 10*3/uL (ref 0.0–0.1)
EOS ABS: 0.1 10*3/uL (ref 0.0–0.7)
Eosinophils Relative: 1 % (ref 0–5)
HCT: 32.5 % — ABNORMAL LOW (ref 36.0–46.0)
Hemoglobin: 10.2 g/dL — ABNORMAL LOW (ref 12.0–15.0)
Lymphocytes Relative: 13 % (ref 12–46)
Lymphs Abs: 1.3 10*3/uL (ref 0.7–4.0)
MCH: 30.4 pg (ref 26.0–34.0)
MCHC: 31.4 g/dL (ref 30.0–36.0)
MCV: 97 fL (ref 78.0–100.0)
Monocytes Absolute: 0.4 10*3/uL (ref 0.1–1.0)
Monocytes Relative: 4 % (ref 3–12)
Neutro Abs: 8.2 10*3/uL — ABNORMAL HIGH (ref 1.7–7.7)
Neutrophils Relative %: 82 % — ABNORMAL HIGH (ref 43–77)
PLATELETS: 280 10*3/uL (ref 150–400)
RBC: 3.35 MIL/uL — ABNORMAL LOW (ref 3.87–5.11)
RDW: 16.5 % — AB (ref 11.5–15.5)
WBC: 10 10*3/uL (ref 4.0–10.5)

## 2013-08-05 LAB — BASIC METABOLIC PANEL
BUN: 26 mg/dL — AB (ref 6–23)
CHLORIDE: 98 meq/L (ref 96–112)
CO2: 31 meq/L (ref 19–32)
Calcium: 7.8 mg/dL — ABNORMAL LOW (ref 8.4–10.5)
Creatinine, Ser: 0.99 mg/dL (ref 0.50–1.10)
GFR calc Af Amer: 63 mL/min — ABNORMAL LOW (ref >90)
GFR calc non Af Amer: 54 mL/min — ABNORMAL LOW (ref >90)
Glucose, Bld: 82 mg/dL (ref 70–99)
POTASSIUM: 4 meq/L (ref 3.7–5.3)
Sodium: 141 mEq/L (ref 137–147)

## 2013-08-05 LAB — COMPREHENSIVE METABOLIC PANEL
ALT: 86 U/L — AB (ref 0–35)
AST: 33 U/L (ref 0–37)
Albumin: 2.7 g/dL — ABNORMAL LOW (ref 3.5–5.2)
Alkaline Phosphatase: 120 U/L — ABNORMAL HIGH (ref 39–117)
BILIRUBIN TOTAL: 0.3 mg/dL (ref 0.3–1.2)
BUN: 24 mg/dL — ABNORMAL HIGH (ref 6–23)
CHLORIDE: 97 meq/L (ref 96–112)
CO2: 31 meq/L (ref 19–32)
Calcium: 8.1 mg/dL — ABNORMAL LOW (ref 8.4–10.5)
Creatinine, Ser: 0.98 mg/dL (ref 0.50–1.10)
GFR calc Af Amer: 63 mL/min — ABNORMAL LOW (ref >90)
GFR, EST NON AFRICAN AMERICAN: 55 mL/min — AB (ref >90)
Glucose, Bld: 86 mg/dL (ref 70–99)
Potassium: 4 mEq/L (ref 3.7–5.3)
SODIUM: 140 meq/L (ref 137–147)
Total Protein: 6 g/dL (ref 6.0–8.3)

## 2013-08-05 LAB — MRSA PCR SCREENING: MRSA BY PCR: NEGATIVE

## 2013-08-05 MED ORDER — LISINOPRIL 2.5 MG PO TABS
2.5000 mg | ORAL_TABLET | Freq: Every day | ORAL | Status: DC
  Administered 2013-08-05 – 2013-08-10 (×6): 2.5 mg via ORAL
  Filled 2013-08-05 (×7): qty 1

## 2013-08-05 NOTE — Progress Notes (Signed)
Patient resting quietly at this time. No complaints of back pain. Foley intact and draining.

## 2013-08-05 NOTE — Plan of Care (Signed)
Problem: Phase I Progression Outcomes Goal: EF % per last Echo/documented,Core Reminder form on chart Outcome: Completed/Met Date Met:  08/05/13 EF 50-55% as of 04/2013

## 2013-08-05 NOTE — Progress Notes (Signed)
[facility] PROGRESS NOTE  Filed Weights   08/04/13 0832 08/05/13 0442  Weight: 113.7 kg (250 lb 10.6 oz) 111.9 kg (246 lb 11.1 oz)        Intake/Output Summary (Last 24 hours) at 08/05/13 0739 Last data filed at 08/05/13 0602  Gross per 24 hour  Intake    240 ml  Output   4200 ml  Net  -3960 ml    Assessment/Plan: Acute on chronic CHF exacerbation-combined systolic diastolic heart failure: - Cont IV Lasix, strict I and O's. Estimated dry weigh ~ 105 kg. - Resume lisinopril.- Fluid restriction  - B-met in am  Acute anemia: - Likely secondary to anemia of chronic disease and acute blood loss anemia.  - Hemoglobin is at baseline of 11.6  - Ferrous sulfate outpatient   Chronic atrial fib:  - Currently rate controlled  - no anticoagulation due to GI bleed.  Hypertension: Blood pressure tends to run low as mentioned above  Continue diuresis with Lasix and monitor   Code Status: full  Family Communication: bedside  Disposition Plan: Inpatient    Consultants:  none  Procedures: ECHO: none  Antibiotics:  None  HPI/Subjective: Complaining of pain all over.  Objective: Filed Vitals:   08/04/13 1706 08/04/13 2032 08/05/13 0132 08/05/13 0442  BP: 114/69 107/66 118/59 112/63  Pulse: 78 80 77 79  Temp:  97.9 F (36.6 C) 97.9 F (36.6 C) 97.8 F (36.6 C)  TempSrc:  Oral Oral Oral  Resp: $Remo'20 19 17 18  'ArMcO$ Height:      Weight:    111.9 kg (246 lb 11.1 oz)  SpO2: 100% 100% 100% 100%     Exam:  General: Alert, awake, oriented x3, in no acute distress.  HEENT: No bruits, no goiter. +JVD Heart: Regular rate and rhythm. Lungs: Good air movement, clear Abdomen: Soft, nontender, nondistended, positive bowel sounds.    Data Reviewed: Basic Metabolic Panel:  Recent Labs Lab 08/04/13 0327 08/04/13 0811 08/05/13 0502  NA 130*  --  141  K 3.7  --  4.0  CL 106  --  98  CO2  --   --  31  GLUCOSE 202*  --  82  BUN 27*  --  26*  CREATININE 1.10  0.96 0.99  CALCIUM  --   --  7.8*   Liver Function Tests: No results found for this basename: AST, ALT, ALKPHOS, BILITOT, PROT, ALBUMIN,  in the last 168 hours No results found for this basename: LIPASE, AMYLASE,  in the last 168 hours No results found for this basename: AMMONIA,  in the last 168 hours CBC:  Recent Labs Lab 08/04/13 0322 08/04/13 0327 08/04/13 0811  WBC 19.9*  --  13.2*  NEUTROABS 18.1*  --   --   HGB 10.1* 11.6* 10.0*  HCT 32.5* 34.0* 31.6*  MCV 97.6  --  98.1  PLT 308  --  277   Cardiac Enzymes: No results found for this basename: CKTOTAL, CKMB, CKMBINDEX, TROPONINI,  in the last 168 hours BNP (last 3 results)  Recent Labs  06/03/13 1825 06/22/13 0621 08/04/13 0322  PROBNP 27115.0* 6129.0* 4718.0*   CBG: No results found for this basename: GLUCAP,  in the last 168 hours  No results found for this or any previous visit (from the past 240 hour(s)).   Studies: Dg Chest Port 1 View  08/04/2013   CLINICAL DATA:  Shortness of breath  EXAM: PORTABLE CHEST - 1 VIEW  COMPARISON:  06/19/2013  FINDINGS: Chronic cardiomegaly and mild aortic tortuosity. There is new diffuse interstitial coarsening compatible with pulmonary edema. There is chronic elevation the left diaphragm with streaky left mid and lower lung opacities, most consistent with atelectasis. No effusion or pneumothorax.  IMPRESSION: 1. CHF. 2. Left basilar atelectasis.   Electronically Signed   By: Jorje Guild M.D.   On: 08/04/2013 04:35    Scheduled Meds: . antiseptic oral rinse  15 mL Mouth Rinse BID  . carvedilol  3.125 mg Oral BID WC  . enoxaparin (LOVENOX) injection  40 mg Subcutaneous Daily  . furosemide  40 mg Intravenous BID  . lisinopril  2.5 mg Oral Daily  . lubiprostone  24 mcg Oral BID WC  . pantoprazole  40 mg Oral Daily  . predniSONE  40 mg Oral Q breakfast   Continuous Infusions:    Charlynne Cousins  [facility] Pager [phone] If 8PM-8AM, please contact  night-coverage at [email or web address], password [facility] 08/05/2013, 7:39 AM  LOS: 1 day     **Disclaimer: This note may have been dictated with voice recognition software. Similar sounding words can inadvertently be transcribed and this note may contain transcription errors which may not have been corrected upon publication of note.**

## 2013-08-06 DIAGNOSIS — R0902 Hypoxemia: Secondary | ICD-10-CM

## 2013-08-06 DIAGNOSIS — N179 Acute kidney failure, unspecified: Secondary | ICD-10-CM

## 2013-08-06 DIAGNOSIS — J9610 Chronic respiratory failure, unspecified whether with hypoxia or hypercapnia: Secondary | ICD-10-CM

## 2013-08-06 DIAGNOSIS — G9340 Encephalopathy, unspecified: Secondary | ICD-10-CM

## 2013-08-06 LAB — BASIC METABOLIC PANEL
BUN: 23 mg/dL (ref 6–23)
CALCIUM: 8.4 mg/dL (ref 8.4–10.5)
CO2: 32 mEq/L (ref 19–32)
Chloride: 95 mEq/L — ABNORMAL LOW (ref 96–112)
Creatinine, Ser: 1.01 mg/dL (ref 0.50–1.10)
GFR calc Af Amer: 61 mL/min — ABNORMAL LOW (ref >90)
GFR, EST NON AFRICAN AMERICAN: 53 mL/min — AB (ref >90)
GLUCOSE: 83 mg/dL (ref 70–99)
Potassium: 3.9 mEq/L (ref 3.7–5.3)
SODIUM: 137 meq/L (ref 137–147)

## 2013-08-06 MED ORDER — FUROSEMIDE 40 MG PO TABS
40.0000 mg | ORAL_TABLET | Freq: Two times a day (BID) | ORAL | Status: DC
  Administered 2013-08-06 – 2013-08-07 (×2): 40 mg via ORAL
  Filled 2013-08-06 (×5): qty 1

## 2013-08-06 NOTE — Evaluation (Signed)
Physical Therapy Evaluation Patient Details Name: [name] MRN: [id number] DOB: 1934-11-05 Today's Date: 08/06/2013   History of Present Illness  Pt is a 78 y/o female with a PMH of CHF, obesity, and a-fib presenting from SNF with a CHF exacerbation.   Clinical Impression  Pt admitted with the above. Pt currently with functional limitations due to the deficits listed below (see PT Problem List). At the time of PT eval pt was able to transition to/from EOB with mod assist. Further mobility not attempted due to no +2 assist available. Pt will benefit from skilled PT to increase their independence and safety with mobility to allow discharge to the venue listed below.       Follow Up Recommendations SNF;Supervision/Assistance - 24 hour    Equipment Recommendations  None recommended by PT    Recommendations for Other Services       Precautions / Restrictions Precautions Precautions: Fall Restrictions Weight Bearing Restrictions: No      Mobility  Bed Mobility Overal bed mobility: Needs Assistance Bed Mobility: Supine to Sit;Sit to Supine     Supine to sit: Mod assist;HOB elevated Sit to supine: Mod assist;HOB elevated   General bed mobility comments: Assist to raise trunk and to scoot hips to EOB. Max assist to scoot pt to [place].   Transfers                    Ambulation/Gait                Stairs            Wheelchair Mobility    Modified Rankin (Stroke Patients Only)       Balance Overall balance assessment: Needs assistance Sitting-balance support: Feet supported;Bilateral upper extremity supported Sitting balance-Leahy Scale: Poor   Postural control: Right lateral lean                                   Pertinent Vitals/Pain Vitals stable throughout session.     Home Living Family/patient expects to be discharged to:: Skilled nursing facility Living Arrangements: Alone                    Prior Function  Level of Independence: Needs assistance   Gait / Transfers Assistance Needed: Per chart review, pt has not been ambulatory since April, however pt cannot recall the last time she has walked. +2 assist vs. use of lift for transfers at [facility].   ADL's / Homemaking Assistance Needed: Assist for all ADL's.        Hand Dominance   Dominant Hand: Right    Extremity/Trunk Assessment   Upper Extremity Assessment: Generalized weakness           Lower Extremity Assessment: Generalized weakness;RLE deficits/detail;LLE deficits/detail RLE Deficits / Details: Knee ext 2/5 - cannot fully extend against gravity LLE Deficits / Details: Knee ext 2/5 - cannot fully extend against gravity  Cervical / Trunk Assessment: Normal  Communication   Communication: HOH  Cognition Arousal/Alertness: Awake/alert Behavior During Therapy: WFL for tasks assessed/performed Overall Cognitive Status: Within Functional Limits for tasks assessed                      General Comments      Exercises        Assessment/Plan    PT Assessment Patient needs continued PT services  PT Diagnosis Generalized weakness  PT Problem List Decreased strength;Decreased range of motion;Decreased activity tolerance;Decreased balance;Decreased mobility;Decreased knowledge of use of DME;Decreased safety awareness;Decreased knowledge of precautions;Pain  PT Treatment Interventions DME instruction;Gait training;Stair training;Functional mobility training;Therapeutic activities;Therapeutic exercise;Neuromuscular re-education;Patient/family education   PT Goals (Current goals can be found in the Care Plan section) Acute Rehab PT Goals Patient Stated Goal: None stated PT Goal Formulation: With patient Time For Goal Achievement: 08/13/13 Potential to Achieve Goals: Fair    Frequency Min 2X/week   Barriers to discharge        Co-evaluation               End of Session Equipment Utilized During Treatment:  Oxygen Activity Tolerance: Patient limited by fatigue;Patient limited by pain Patient left: in bed;with call bell/phone within reach;with bed alarm set Nurse Communication: Mobility status         Time: 5277-8242 PT Time Calculation (min): 15 min   Charges:   PT Evaluation $Initial PT Evaluation Tier I: 1 Procedure PT Treatments $Therapeutic Activity: 8-22 mins   PT G CodesJolyn Lent 08/06/2013, 4:43 PM  Jolyn Lent, PT, DPT Acute Rehabilitation Services Pager: [phone]

## 2013-08-06 NOTE — Progress Notes (Signed)
[facility] PROGRESS NOTE  Filed Weights   08/04/13 0832 08/05/13 0442 08/06/13 0651  Weight: 113.7 kg (250 lb 10.6 oz) 111.9 kg (246 lb 11.1 oz) 111.4 kg (245 lb 9.5 oz)        Intake/Output Summary (Last 24 hours) at 08/06/13 0804 Last data filed at 08/06/13 9323  Gross per 24 hour  Intake    960 ml  Output   3000 ml  Net  -2040 ml    Assessment/Plan: Acute on chronic CHF exacerbation-combined systolic diastolic heart failure: - Cont IV Lasix, strict I and O's. Estimated dry weigh ~ 105 kg. - good diuresis overnight. - weight is trending down. +JVD.   Acute anemia: - Likely secondary to anemia of chronic disease and acute blood loss anemia.  - Hemoglobin is at baseline of 11.6  - Ferrous sulfate outpatient   Chronic atrial fib:  - Currently rate controlled  - no anticoagulation due to GI bleed.  Hypertension: - Blood pressure tends to run low as mentioned above  - Continue diuresis with Lasix and monitor   Code Status: full  Family Communication: bedside  Disposition Plan: Inpatient    Consultants:  none  Procedures: ECHO: none  Antibiotics:  None  HPI/Subjective: Pain improved.  Objective: Filed Vitals:   08/05/13 1711 08/05/13 2028 08/06/13 0239 08/06/13 0651  BP: 122/51 101/55 127/62 138/72  Pulse: 88 87 85 84  Temp:  98.4 F (36.9 C) 98.2 F (36.8 C) 97.7 F (36.5 C)  TempSrc:  Oral Oral Oral  Resp: 20 19 18 19   Height:      Weight:    111.4 kg (245 lb 9.5 oz)  SpO2: 100% 99% 100% 100%     Exam:  General: Alert, awake, oriented x3, in no acute distress.  HEENT: No bruits, no goiter. +JVD Heart: Regular rate and rhythm. Lungs: Good air movement, clear Abdomen: Soft, nontender, nondistended, positive bowel sounds.    Data Reviewed: Basic Metabolic Panel:  Recent Labs Lab 08/04/13 0327 08/04/13 0811 08/05/13 0502 08/05/13 0800 08/06/13 0300  NA 130*  --  141 140 137  K 3.7  --  4.0 4.0 3.9  CL 106  --  98 97  95*  CO2  --   --  31 31 32  GLUCOSE 202*  --  82 86 83  BUN 27*  --  26* 24* 23  CREATININE 1.10 0.96 0.99 0.98 1.01  CALCIUM  --   --  7.8* 8.1* 8.4   Liver Function Tests:  Recent Labs Lab 08/05/13 0800  AST 33  ALT 86*  ALKPHOS 120*  BILITOT 0.3  PROT 6.0  ALBUMIN 2.7*   No results found for this basename: LIPASE, AMYLASE,  in the last 168 hours No results found for this basename: AMMONIA,  in the last 168 hours CBC:  Recent Labs Lab 08/04/13 0322 08/04/13 0327 08/04/13 0811 08/05/13 0800  WBC 19.9*  --  13.2* 10.0  NEUTROABS 18.1*  --   --  8.2*  HGB 10.1* 11.6* 10.0* 10.2*  HCT 32.5* 34.0* 31.6* 32.5*  MCV 97.6  --  98.1 97.0  PLT 308  --  277 280   Cardiac Enzymes: No results found for this basename: CKTOTAL, CKMB, CKMBINDEX, TROPONINI,  in the last 168 hours BNP (last 3 results)  Recent Labs  06/03/13 1825 06/22/13 0621 08/04/13 0322  PROBNP 27115.0* 6129.0* 4718.0*   CBG: No results found for this basename: GLUCAP,  in the last 168 hours  Recent Results (from the past 240 hour(s))  MRSA PCR SCREENING     Status: None   Collection Time    08/05/13  7:14 PM      Result Value Ref Range Status   MRSA by PCR NEGATIVE  NEGATIVE Final   Comment:            The GeneXpert MRSA Assay (FDA     approved for NASAL specimens     only), is one component of a     comprehensive MRSA colonization     surveillance program. It is not     intended to diagnose MRSA     infection nor to guide or     monitor treatment for     MRSA infections.     Studies: No results found.  Scheduled Meds: . antiseptic oral rinse  15 mL Mouth Rinse BID  . carvedilol  3.125 mg Oral BID WC  . enoxaparin (LOVENOX) injection  40 mg Subcutaneous Daily  . furosemide  40 mg Intravenous BID  . lisinopril  2.5 mg Oral Daily  . lubiprostone  24 mcg Oral BID WC  . pantoprazole  40 mg Oral Daily  . predniSONE  40 mg Oral Q breakfast   Continuous Infusions:    Charlynne Cousins  [facility] Pager [phone] If 8PM-8AM, please contact night-coverage at [email or web address], password [facility] 08/06/2013, 8:04 AM  LOS: 2 days     **Disclaimer: This note may have been dictated with voice recognition software. Similar sounding words can inadvertently be transcribed and this note may contain transcription errors which may not have been corrected upon publication of note.**

## 2013-08-06 NOTE — Progress Notes (Signed)
Patient evaluated for community based chronic disease management services with [facility] as a benefit of patient's [employer]. Spoke with patient at bedside to explain [facility] services.  She has declined services at this time citing that she thinks she needs to stay at SNF level care long term.  Left contact information and [facility] literature at bedside. Made Inpatient Case Manager aware that [facility] following. Of note, [facility] Management services does not replace or interfere with any services that are arranged by inpatient case management or social work.  For additional questions or referrals please contact Corliss Blacker BSN RN [facility] Liaison at [phone].

## 2013-08-06 NOTE — Care Management Note (Signed)
    Page 1 of 1   08/06/2013     11:08:18 AM CARE MANAGEMENT NOTE 08/06/2013  Patient:  [name], [name]   Account Number:  [id number]  Date Initiated:  08/06/2013  Documentation initiated by:  [facility]  Subjective/Objective Assessment:   79 year old female, nursing home resident with history of combined systolic and diastolic congestive heart failure, atrial fibrillation.//From [facility]     Action/Plan:   IV Lasix; Monitor creatinine closely.// Assist with return to [facility]   Anticipated DC Date:  08/09/2013   Anticipated DC Plan:  [place]  In-house referral  Clinical Social Worker      DC Planning Services  CM consult      Choice offered to / List presented to:             Status of service:  In process, will continue to follow Medicare Important Message given?   (If response is "NO", the following Medicare IM given date fields will be blank) Date Medicare IM given:   Date Additional Medicare IM given:    Discharge Disposition:    Per UR Regulation:  Reviewed for med. necessity/level of care/duration of stay  If discussed at [place] of Stay Meetings, dates discussed:    Comments:

## 2013-08-07 DIAGNOSIS — E876 Hypokalemia: Secondary | ICD-10-CM

## 2013-08-07 LAB — BASIC METABOLIC PANEL
BUN: 18 mg/dL (ref 6–23)
CO2: 33 mEq/L — ABNORMAL HIGH (ref 19–32)
Calcium: 8.3 mg/dL — ABNORMAL LOW (ref 8.4–10.5)
Chloride: 98 mEq/L (ref 96–112)
Creatinine, Ser: 0.89 mg/dL (ref 0.50–1.10)
GFR, EST AFRICAN AMERICAN: 71 mL/min — AB (ref >90)
GFR, EST NON AFRICAN AMERICAN: 61 mL/min — AB (ref >90)
Glucose, Bld: 81 mg/dL (ref 70–99)
Potassium: 4 mEq/L (ref 3.7–5.3)
SODIUM: 140 meq/L (ref 137–147)

## 2013-08-07 MED ORDER — FUROSEMIDE 10 MG/ML IJ SOLN
40.0000 mg | Freq: Two times a day (BID) | INTRAMUSCULAR | Status: DC
  Administered 2013-08-07 – 2013-08-10 (×7): 40 mg via INTRAVENOUS
  Filled 2013-08-07 (×6): qty 4

## 2013-08-07 NOTE — Progress Notes (Signed)
[facility] PROGRESS NOTE  Filed Weights   08/05/13 0442 08/06/13 0651 08/07/13 0703  Weight: 111.9 kg (246 lb 11.1 oz) 111.4 kg (245 lb 9.5 oz) 110.84 kg (244 lb 5.7 oz)        Intake/Output Summary (Last 24 hours) at 08/07/13 0942 Last data filed at 08/07/13 0732  Gross per 24 hour  Intake   1080 ml  Output   2452 ml  Net  -1372 ml    Assessment/Plan: Acute on chronic CHF exacerbation-combined systolic diastolic heart failure: - Cont IV Lasix, strict I and O's. Estimated dry weigh ~ 105 kg. - good diuresis overnight. - weight is trending down. +JVD. Cr stable. - monitor electrolytes. - will need SNF.  Acute anemia: - Likely secondary to anemia of chronic disease and acute blood loss anemia.  - Hemoglobin is at baseline of 11.6  - Ferrous sulfate outpatient   Chronic atrial fib:  - Currently rate controlled  - no anticoagulation due to GI bleed.  Hypertension: - Blood pressure tends to run low as mentioned above  - Continue diuresis with Lasix and monitor   Code Status: full  Family Communication: bedside  Disposition Plan: Inpatient    Consultants:  none  Procedures: ECHO: none  Antibiotics:  None  HPI/Subjective: Pain improved.  Objective: Filed Vitals:   08/06/13 1440 08/06/13 1621 08/06/13 2112 08/07/13 0703  BP: 105/54 125/62 112/67 125/77  Pulse: 80 79 74 78  Temp: 98.9 F (37.2 C) 98.7 F (37.1 C) 98.2 F (36.8 C) 98.2 F (36.8 C)  TempSrc: Oral Oral Oral Oral  Resp: 18 18 18 18   Height:      Weight:    110.84 kg (244 lb 5.7 oz)  SpO2: 100%  98% 100%     Exam:  General: Alert, awake, oriented x3, in no acute distress.  HEENT: No bruits, no goiter. +JVD Heart: Regular rate and rhythm. Lungs: Good air movement, clear Abdomen: Soft, nontender, nondistended, positive bowel sounds.    Data Reviewed: Basic Metabolic Panel:  Recent Labs Lab 08/04/13 0327 08/04/13 0811 08/05/13 0502 08/05/13 0800 08/06/13 0300  08/07/13 0435  NA 130*  --  141 140 137 140  K 3.7  --  4.0 4.0 3.9 4.0  CL 106  --  98 97 95* 98  CO2  --   --  31 31 32 33*  GLUCOSE 202*  --  82 86 83 81  BUN 27*  --  26* 24* 23 18  CREATININE 1.10 0.96 0.99 0.98 1.01 0.89  CALCIUM  --   --  7.8* 8.1* 8.4 8.3*   Liver Function Tests:  Recent Labs Lab 08/05/13 0800  AST 33  ALT 86*  ALKPHOS 120*  BILITOT 0.3  PROT 6.0  ALBUMIN 2.7*   No results found for this basename: LIPASE, AMYLASE,  in the last 168 hours No results found for this basename: AMMONIA,  in the last 168 hours CBC:  Recent Labs Lab 08/04/13 0322 08/04/13 0327 08/04/13 0811 08/05/13 0800  WBC 19.9*  --  13.2* 10.0  NEUTROABS 18.1*  --   --  8.2*  HGB 10.1* 11.6* 10.0* 10.2*  HCT 32.5* 34.0* 31.6* 32.5*  MCV 97.6  --  98.1 97.0  PLT 308  --  277 280   Cardiac Enzymes: No results found for this basename: CKTOTAL, CKMB, CKMBINDEX, TROPONINI,  in the last 168 hours BNP (last 3 results)  Recent Labs  06/03/13 1825 06/22/13 1937 08/04/13 9024  PROBNP 27115.0* 6129.0* 4718.0*   CBG: No results found for this basename: GLUCAP,  in the last 168 hours  Recent Results (from the past 240 hour(s))  MRSA PCR SCREENING     Status: None   Collection Time    08/05/13  7:14 PM      Result Value Ref Range Status   MRSA by PCR NEGATIVE  NEGATIVE Final   Comment:            The GeneXpert MRSA Assay (FDA     approved for NASAL specimens     only), is one component of a     comprehensive MRSA colonization     surveillance program. It is not     intended to diagnose MRSA     infection nor to guide or     monitor treatment for     MRSA infections.     Studies: No results found.  Scheduled Meds: . antiseptic oral rinse  15 mL Mouth Rinse BID  . carvedilol  3.125 mg Oral BID WC  . enoxaparin (LOVENOX) injection  40 mg Subcutaneous Daily  . furosemide  40 mg Intravenous Q12H  . lisinopril  2.5 mg Oral Daily  . lubiprostone  24 mcg Oral BID WC  .  pantoprazole  40 mg Oral Daily  . predniSONE  40 mg Oral Q breakfast   Continuous Infusions:    Charlynne Cousins  [facility] Pager [phone] If 8PM-8AM, please contact night-coverage at [email or web address], password [facility] 08/07/2013, 9:42 AM  LOS: 3 days     **Disclaimer: This note may have been dictated with voice recognition software. Similar sounding words can inadvertently be transcribed and this note may contain transcription errors which may not have been corrected upon publication of note.**

## 2013-08-07 NOTE — Progress Notes (Signed)
Medicare Important Message given. Camellia J. Wood, RN, BSN, NCM [phone].   

## 2013-08-07 NOTE — Progress Notes (Signed)
Physical Therapy Treatment Patient Details Name: [name] MRN: [id number] DOB: Oct 24, 1934 Today's Date: 08/07/2013    History of Present Illness Pt is a 79 y/o female with a PMH of CHF, obesity, and a-fib presenting from SNF with a CHF exacerbation.     PT Comments    Pt progressing towards physical therapy goals. Does not appear motivated to participate in therapy but agreeable with encouragement. Was able to transfer bed>chair with mod +2 assist. Tolerated minimal standing activity due to pain in bottoms of bilateral feet (states it feels like she is stepping on balls). Was able to tolerate increased exercise seated with weight off of feet.   Follow Up Recommendations  SNF;Supervision/Assistance - 24 hour     Equipment Recommendations  None recommended by PT    Recommendations for Other Services       Precautions / Restrictions Precautions Precautions: Fall Restrictions Weight Bearing Restrictions: No    Mobility  Bed Mobility Overal bed mobility: Needs Assistance Bed Mobility: Supine to Sit     Supine to sit: Min assist     General bed mobility comments: Increased time to achieve EOB as pt moving very slowly. Frequent VC's to encourage pt to complete task of scooting and resting feet on floor. Assist for trunk elevation to full sitting position, as well as with use of bedpad to scoot hips.   Transfers Overall transfer level: Needs assistance Equipment used: Rolling walker (2 wheeled);None Transfers: Sit to/from [facility] Sit to Stand: Mod assist;+2 safety/equipment Stand pivot transfers: Min assist;+2 physical assistance;+2 safety/equipment       General transfer comment: SPT with +2 assist and no RW. Pt with difficulty advancing feet around and relied heavily on therapist/tech for support. Sit<>stand from chair with assist to power-up to full standing to the RW. VC's for hand placement on seated surface for safety.   Ambulation/Gait                 Stairs            Wheelchair Mobility    Modified Rankin (Stroke Patients Only)       Balance Overall balance assessment: Needs assistance Sitting-balance support: Feet supported;Bilateral upper extremity supported Sitting balance-Leahy Scale: Poor     Standing balance support: Bilateral upper extremity supported Standing balance-Leahy Scale: Poor                      Cognition Arousal/Alertness: Awake/alert Behavior During Therapy: WFL for tasks assessed/performed Overall Cognitive Status: Within Functional Limits for tasks assessed                      Exercises General Exercises - Lower Extremity Long Arc Quad: 10 reps;Both Hip ABduction/ADduction: 10 reps;Both Hip Flexion/Marching: 10 reps;Standing;Both    General Comments        Pertinent Vitals/Pain Vitals stable throughout session.     Home Living                      Prior Function            PT Goals (current goals can now be found in the care plan section) Acute Rehab PT Goals Patient Stated Goal: None stated PT Goal Formulation: With patient Time For Goal Achievement: 08/13/13 Potential to Achieve Goals: Fair Progress towards PT goals: Progressing toward goals    Frequency  Min 2X/week    PT Plan Current plan remains appropriate  Co-evaluation             End of Session Equipment Utilized During Treatment: Oxygen;Gait belt Activity Tolerance: Patient limited by fatigue;Patient limited by pain Patient left: in chair;with call bell/phone within reach     Time: 9675-9163 PT Time Calculation (min): 21 min  Charges:  $Therapeutic Activity: 8-22 mins                    G Codes:      Jolyn Lent 08/12/2013, 2:13 PM  Jolyn Lent, PT, DPT Acute Rehabilitation Services Pager: [phone]

## 2013-08-07 NOTE — Clinical Social Work Psychosocial (Addendum)
    Clinical Social Work Department BRIEF PSYCHOSOCIAL ASSESSMENT 08/06/2013  Patient:  [name], [name]     Account Number:  [id number]     Admit date:  08/04/2013  Clinical Social Worker:  Iona Coach  Date/Time:  08/06/2013 11:30 AM  Referred by:  Physician  Date Referred:  08/06/2013 Referred for  Other - See comment   Other Referral:   Return to [place] type:  Patient Other interview type:    PSYCHOSOCIAL DATA Living Status:  FACILITY Admitted from facility:  [place] Level of care:  [place] Primary support name:  Inell Mimbs [phone] Primary support relationship to patient:  CHILD, ADULT Degree of support available:   Strong support    CURRENT CONCERNS Current Concerns  Other - See comment   Other Concerns:   Return to [place] / PLAN 79 year old female- admitted from [facility] where she states that she is receiving rehab. Per discussion with MD- patient has had multiple hospitalizations in the past few months from the facility. Patient states that she is aware of this but wants to come to the hospital if she is "sick".  MD spoke to her about needing to be in a SNF where there is a strong HF program and CSW also discussed with patient; she wants to return to [facility] as she has friends there and likes the care. CSW provided support; Fl2 place on chart for MD's signature.   Assessment/plan status:  Psychosocial Support/Ongoing Assessment of Needs Other assessment/ plan:   Information/referral to community resources:   None    PATIENT'S/FAMILY'S RESPONSE TO PLAN OF CARE: Patient is alert and oriented; she states that she has a supportive  son and daughter but she makes her own health care decisions. She gave permission for CSW to contact her children as needed but stated that she was keeping them informed as to her medical status. CSW will assist patient to return to  [facility] when medically stable per MD.

## 2013-08-08 DIAGNOSIS — I4891 Unspecified atrial fibrillation: Secondary | ICD-10-CM

## 2013-08-08 DIAGNOSIS — I10 Essential (primary) hypertension: Secondary | ICD-10-CM

## 2013-08-08 DIAGNOSIS — Z8719 Personal history of other diseases of the digestive system: Secondary | ICD-10-CM

## 2013-08-08 LAB — BASIC METABOLIC PANEL
BUN: 19 mg/dL (ref 6–23)
CHLORIDE: 93 meq/L — AB (ref 96–112)
CO2: 34 meq/L — AB (ref 19–32)
Calcium: 8.5 mg/dL (ref 8.4–10.5)
Creatinine, Ser: 0.93 mg/dL (ref 0.50–1.10)
GFR calc Af Amer: 67 mL/min — ABNORMAL LOW (ref >90)
GFR calc non Af Amer: 58 mL/min — ABNORMAL LOW (ref >90)
GLUCOSE: 80 mg/dL (ref 70–99)
POTASSIUM: 3.7 meq/L (ref 3.7–5.3)
SODIUM: 136 meq/L — AB (ref 137–147)

## 2013-08-08 MED ORDER — PANTOPRAZOLE SODIUM 40 MG PO TBEC
40.0000 mg | DELAYED_RELEASE_TABLET | Freq: Two times a day (BID) | ORAL | Status: DC
  Administered 2013-08-08 – 2013-08-10 (×4): 40 mg via ORAL
  Filled 2013-08-08 (×4): qty 1

## 2013-08-08 MED ORDER — TRAMADOL HCL 50 MG PO TABS
50.0000 mg | ORAL_TABLET | Freq: Three times a day (TID) | ORAL | Status: DC | PRN
  Administered 2013-08-08 – 2013-08-10 (×5): 50 mg via ORAL
  Filled 2013-08-08 (×5): qty 1

## 2013-08-08 MED ORDER — METOLAZONE 2.5 MG PO TABS
2.5000 mg | ORAL_TABLET | Freq: Every day | ORAL | Status: DC
  Administered 2013-08-08 – 2013-08-09 (×2): 2.5 mg via ORAL
  Filled 2013-08-08 (×3): qty 1

## 2013-08-08 NOTE — Progress Notes (Signed)
[facility] PROGRESS NOTE  CINTHYA BORS GLO:[id number] DOB: Jun 06, 1934 DOA: 08/04/2013 PCP: Osborne Casco, MD  Assessment/Plan: 1- Acute on chronic CHF exacerbation (combined systolic/diastolic dysfunction) -Patient is a slowly but steadily improving -Reports being less short of breath and denies any chest pain -Diuresis is still poor; patient with mild JVD, decreased breath sounds at her lung bases and bilaterally 2+ lower extremity edema (signs of ongoing fluid overload) -Creatinine is within normal limits -Will add metolazone to her current IV Lasix for better diuresis -Estimated dry weight around 105 kg (patient is still approx 10 pounds above dry weight) -Patient instructed to follow low sodium diet, fluid restriction and to check her weight on daily basis -Will apply TED hose -Continue beta blocker and lisinopril  2-anemia of chronic disease -Hemoglobin remained stable and there has not been any signs of overt bleeding -Plan is to continue ferrous sulfate as an outpatient  3-GERD: Continue PPI  4-hypertension: Stable. Will continue current medication regimen and follow blood pressure trend  5-chronic atrial fibrillation: Continue beta blocker. Rate is controlled in a stable at this moment. -No chronic anticoagulation given history of GI bleed  DVT: lovenox  Code Status: Full Family Communication: No family at bedside Disposition Plan: Skilled nursing facility when medically stable   Consultants:  None  Procedures:  See below for x-ray reports  Antibiotics:  None  HPI/Subjective: Alert, awake and oriented x3; patient is afebrile and reported improvement in her breathing.  Objective: Filed Vitals:   08/08/13 1420  BP: 114/61  Pulse: 86  Temp: 97.6 F (36.4 C)  Resp: 18    Intake/Output Summary (Last 24 hours) at 08/08/13 1654 Last data filed at 08/08/13 1418  Gross per 24 hour  Intake   1200 ml  Output   4950 ml  Net  -3750 ml    Filed Weights   08/07/13 0703 08/07/13 1119 08/08/13 0536  Weight: 110.84 kg (244 lb 5.7 oz) 109.3 kg (240 lb 15.4 oz) 110.5 kg (243 lb 9.7 oz)    Exam:   General:  Alert, awake and oriented x3; patient is afebrile and reported improvement in her breathing.  Cardiovascular: Regular controlled, positive soft systolic ejection murmur; no rubs or gallops. 2+ lower extremity edema bilaterally  Respiratory: Decreased breath sounds at her bases bilaterally; no wheezing  Abdomen: Soft, nontender, nondistended, no rebound; positive bowel sounds  Musculoskeletal: No cyanosis or clubbing  Data Reviewed: Basic Metabolic Panel:  Recent Labs Lab 08/05/13 0502 08/05/13 0800 08/06/13 0300 08/07/13 0435 08/08/13 0504  NA 141 140 137 140 136*  K 4.0 4.0 3.9 4.0 3.7  CL 98 97 95* 98 93*  CO2 31 31 32 33* 34*  GLUCOSE 82 86 83 81 80  BUN 26* 24* 23 18 19   CREATININE 0.99 0.98 1.01 0.89 0.93  CALCIUM 7.8* 8.1* 8.4 8.3* 8.5   Liver Function Tests:  Recent Labs Lab 08/05/13 0800  AST 33  ALT 86*  ALKPHOS 120*  BILITOT 0.3  PROT 6.0  ALBUMIN 2.7*   CBC:  Recent Labs Lab 08/04/13 0322 08/04/13 0327 08/04/13 0811 08/05/13 0800  WBC 19.9*  --  13.2* 10.0  NEUTROABS 18.1*  --   --  8.2*  HGB 10.1* 11.6* 10.0* 10.2*  HCT 32.5* 34.0* 31.6* 32.5*  MCV 97.6  --  98.1 97.0  PLT 308  --  277 280   BNP (last 3 results)  Recent Labs  06/03/13 1825 06/22/13 0621 08/04/13 0322  PROBNP 27115.0* 6129.0* 4718.0*  CBG: No results found for this basename: GLUCAP,  in the last 168 hours  Recent Results (from the past 240 hour(s))  MRSA PCR SCREENING     Status: None   Collection Time    08/05/13  7:14 PM      Result Value Ref Range Status   MRSA by PCR NEGATIVE  NEGATIVE Final   Comment:            The GeneXpert MRSA Assay (FDA     approved for NASAL specimens     only), is one component of a     comprehensive MRSA colonization     surveillance program. It is not      intended to diagnose MRSA     infection nor to guide or     monitor treatment for     MRSA infections.     Studies: No results found.  Scheduled Meds: . antiseptic oral rinse  15 mL Mouth Rinse BID  . carvedilol  3.125 mg Oral BID WC  . enoxaparin (LOVENOX) injection  40 mg Subcutaneous Daily  . furosemide  40 mg Intravenous Q12H  . lisinopril  2.5 mg Oral Daily  . lubiprostone  24 mcg Oral BID WC  . metolazone  2.5 mg Oral Daily  . pantoprazole  40 mg Oral BID  . predniSONE  40 mg Oral Q breakfast   Continuous Infusions:   Active Problems:   CHF (congestive heart failure)   Congestive heart failure, NYHA class 4    Time spent: >30 minutes    Barton Dubois  [facility] Pager [phone]. If 7PM-7AM, please contact night-coverage at [email or web address], password [facility] 08/08/2013, 4:54 PM  LOS: 4 days

## 2013-08-09 DIAGNOSIS — D509 Iron deficiency anemia, unspecified: Secondary | ICD-10-CM

## 2013-08-09 LAB — CBC
HCT: 33.4 % — ABNORMAL LOW (ref 36.0–46.0)
HEMOGLOBIN: 10.4 g/dL — AB (ref 12.0–15.0)
MCH: 29.9 pg (ref 26.0–34.0)
MCHC: 31.1 g/dL (ref 30.0–36.0)
MCV: 96 fL (ref 78.0–100.0)
Platelets: 280 10*3/uL (ref 150–400)
RBC: 3.48 MIL/uL — AB (ref 3.87–5.11)
RDW: 16.2 % — ABNORMAL HIGH (ref 11.5–15.5)
WBC: 10.7 10*3/uL — ABNORMAL HIGH (ref 4.0–10.5)

## 2013-08-09 LAB — BASIC METABOLIC PANEL
Anion gap: 13 (ref 5–15)
BUN: 28 mg/dL — AB (ref 6–23)
CHLORIDE: 88 meq/L — AB (ref 96–112)
CO2: 32 meq/L (ref 19–32)
Calcium: 8.5 mg/dL (ref 8.4–10.5)
Creatinine, Ser: 1.04 mg/dL (ref 0.50–1.10)
GFR calc Af Amer: 59 mL/min — ABNORMAL LOW (ref >90)
GFR calc non Af Amer: 51 mL/min — ABNORMAL LOW (ref >90)
GLUCOSE: 87 mg/dL (ref 70–99)
POTASSIUM: 3.5 meq/L — AB (ref 3.7–5.3)
SODIUM: 133 meq/L — AB (ref 137–147)

## 2013-08-09 MED ORDER — POTASSIUM CHLORIDE CRYS ER 20 MEQ PO TBCR
20.0000 meq | EXTENDED_RELEASE_TABLET | Freq: Every day | ORAL | Status: DC
  Administered 2013-08-09 – 2013-08-10 (×2): 20 meq via ORAL
  Filled 2013-08-09 (×2): qty 1

## 2013-08-09 NOTE — Progress Notes (Signed)
Slept well during the night, complain of generalized pain this morning, ultram given per MD orders, tolerated well

## 2013-08-09 NOTE — Progress Notes (Signed)
Patient discussed in Progression Rounds- per MD- possible d/c back to [facility] either tomorrow or Saturday. CSW spoke to [place]- Admissions at [facility]. She stated that they can accept patient back on Saturday as long as they have a d/c summary by tomorrow to order medications. CSW will discuss again with MD in the morning.  Lorie Phenix. [place], [place]

## 2013-08-09 NOTE — Plan of Care (Signed)
Problem: Phase I Progression Outcomes Goal: Voiding-avoid urinary catheter unless indicated Outcome: Not Applicable Date Met:  48/47/20 Chronic F/C

## 2013-08-09 NOTE — Progress Notes (Signed)
[facility] PROGRESS NOTE  [name] OEV:[id number] DOB: 1934/11/13 DOA: 08/04/2013 PCP: Osborne Casco, MD  Assessment/Plan: 1- Acute on chronic CHF exacerbation (combined systolic/diastolic dysfunction) -Patient with great diuresis overnight and significant improving in her breathing -current weight is 107 Kg; EDW approx 105 -Diuresis excellent with metolazone on board -Creatinine remains stable -Will continue current dose of lasix and metolazone -Estimated dry weight around 105 kg (patient is still approx 5 pounds above dry weight) -Patient instructed to follow low sodium diet, fluid restriction and to check her weight on daily basis -Will continue TED hose -Continue beta blocker and lisinopril  2-anemia of chronic disease -Hemoglobin remained stable and there has not been any signs of overt bleeding -Plan is to continue ferrous sulfate as an outpatient  3-GERD: Continue PPI  4-hypertension: Stable. Will continue current medication regimen and follow blood pressure trend  5-chronic atrial fibrillation: Continue beta blocker. Rate is controlled and stable at this moment. -No chronic anticoagulation given history of GI bleed in the past  DVT: lovenox  Code Status: Full Family Communication: No family at bedside Disposition Plan: Skilled nursing facility when medically stable   Consultants:  None  Procedures:  See below for x-ray reports  Antibiotics:  None  HPI/Subjective: Alert, awake and oriented x3; patient is afebrile and with improved air movement; closer to dry weight.  Objective: Filed Vitals:   08/09/13 1113  BP: 110/60  Pulse: 77  Temp:   Resp:     Intake/Output Summary (Last 24 hours) at 08/09/13 1149 Last data filed at 08/09/13 0852  Gross per 24 hour  Intake    720 ml  Output   5100 ml  Net  -4380 ml   Filed Weights   08/07/13 1119 08/08/13 0536 08/09/13 0417  Weight: 109.3 kg (240 lb 15.4 oz) 110.5 kg (243 lb 9.7 oz)  107.2 kg (236 lb 5.3 oz)    Exam:   General:  Alert, awake and oriented x3; patient is afebrile and reported improvement in her breathing.  Cardiovascular: Regular controlled, positive soft systolic ejection murmur; no rubs or gallops. 1+ lower extremity edema bilaterally  Respiratory: improved breath sounds at her bases bilaterally; no wheezing and no frank crackles  Abdomen: Soft, nontender, nondistended, no rebound; positive bowel sounds  Musculoskeletal: No cyanosis or clubbing  Data Reviewed: Basic Metabolic Panel:  Recent Labs Lab 08/05/13 0800 08/06/13 0300 08/07/13 0435 08/08/13 0504 08/09/13 0357  NA 140 137 140 136* 133*  K 4.0 3.9 4.0 3.7 3.5*  CL 97 95* 98 93* 88*  CO2 31 32 33* 34* 32  GLUCOSE 86 83 81 80 87  BUN 24* 23 18 19  28*  CREATININE 0.98 1.01 0.89 0.93 1.04  CALCIUM 8.1* 8.4 8.3* 8.5 8.5   Liver Function Tests:  Recent Labs Lab 08/05/13 0800  AST 33  ALT 86*  ALKPHOS 120*  BILITOT 0.3  PROT 6.0  ALBUMIN 2.7*   CBC:  Recent Labs Lab 08/04/13 0322 08/04/13 0327 08/04/13 0811 08/05/13 0800 08/09/13 0357  WBC 19.9*  --  13.2* 10.0 10.7*  NEUTROABS 18.1*  --   --  8.2*  --   HGB 10.1* 11.6* 10.0* 10.2* 10.4*  HCT 32.5* 34.0* 31.6* 32.5* 33.4*  MCV 97.6  --  98.1 97.0 96.0  PLT 308  --  277 280 280   BNP (last 3 results)  Recent Labs  06/03/13 1825 06/22/13 0621 08/04/13 0322  PROBNP 27115.0* 6129.0* 4718.0*    Recent Results (from the  past 240 hour(s))  MRSA PCR SCREENING     Status: None   Collection Time    08/05/13  7:14 PM      Result Value Ref Range Status   MRSA by PCR NEGATIVE  NEGATIVE Final   Comment:            The GeneXpert MRSA Assay (FDA     approved for NASAL specimens     only), is one component of a     comprehensive MRSA colonization     surveillance program. It is not     intended to diagnose MRSA     infection nor to guide or     monitor treatment for     MRSA infections.     Studies: No  results found.  Scheduled Meds: . antiseptic oral rinse  15 mL Mouth Rinse BID  . carvedilol  3.125 mg Oral BID WC  . enoxaparin (LOVENOX) injection  40 mg Subcutaneous Daily  . furosemide  40 mg Intravenous Q12H  . lisinopril  2.5 mg Oral Daily  . lubiprostone  24 mcg Oral BID WC  . metolazone  2.5 mg Oral Daily  . pantoprazole  40 mg Oral BID  . predniSONE  40 mg Oral Q breakfast   Continuous Infusions:   Active Problems:   CHF (congestive heart failure)   Congestive heart failure, NYHA class 4    Time spent: >30 minutes    Barton Dubois  [facility] Pager [phone]. If 7PM-7AM, please contact night-coverage at [email or web address], password [facility] 08/09/2013, 11:49 AM  LOS: 5 days

## 2013-08-10 DIAGNOSIS — R5381 Other malaise: Secondary | ICD-10-CM

## 2013-08-10 DIAGNOSIS — R5383 Other fatigue: Secondary | ICD-10-CM

## 2013-08-10 LAB — BASIC METABOLIC PANEL
ANION GAP: 14 (ref 5–15)
BUN: 34 mg/dL — ABNORMAL HIGH (ref 6–23)
CO2: 35 meq/L — AB (ref 19–32)
CREATININE: 1.21 mg/dL — AB (ref 0.50–1.10)
Calcium: 8.9 mg/dL (ref 8.4–10.5)
Chloride: 82 mEq/L — ABNORMAL LOW (ref 96–112)
GFR calc Af Amer: 49 mL/min — ABNORMAL LOW (ref >90)
GFR calc non Af Amer: 42 mL/min — ABNORMAL LOW (ref >90)
GLUCOSE: 87 mg/dL (ref 70–99)
Potassium: 3.2 mEq/L — ABNORMAL LOW (ref 3.7–5.3)
Sodium: 131 mEq/L — ABNORMAL LOW (ref 137–147)

## 2013-08-10 MED ORDER — HYDROCODONE-ACETAMINOPHEN 5-325 MG PO TABS: 1.0000 | ORAL_TABLET | Freq: Three times a day (TID) | ORAL | Status: DC | PRN

## 2013-08-10 MED ORDER — POTASSIUM CHLORIDE CRYS ER 20 MEQ PO TBCR: 20.0000 meq | EXTENDED_RELEASE_TABLET | Freq: Every day | ORAL | Status: DC

## 2013-08-10 MED ORDER — FUROSEMIDE 20 MG PO TABS: 60.0000 mg | ORAL_TABLET | Freq: Two times a day (BID) | ORAL | Status: DC

## 2013-08-10 MED ORDER — TRAMADOL HCL 50 MG PO TABS: 50.0000 mg | ORAL_TABLET | Freq: Three times a day (TID) | ORAL | Status: DC | PRN

## 2013-08-10 MED ORDER — PANTOPRAZOLE SODIUM 40 MG PO TBEC: 40.0000 mg | DELAYED_RELEASE_TABLET | Freq: Two times a day (BID) | ORAL | Status: AC

## 2013-08-10 NOTE — Progress Notes (Signed)
Pt a/o, c/o pain PRN tramadol given as ordered, pts Foley removed, pt will be d/c back to [place], report called, pt stable

## 2013-08-10 NOTE — Progress Notes (Signed)
Pt potassium 3.2. MD on call paged via [place]. Ronnette Hila, RN

## 2013-08-10 NOTE — Discharge Summary (Signed)
Physician Discharge Summary  [name] PFX:[id number] DOB: [birth year] DOA: 08/04/2013  PCP: Osborne Casco, MD  Admit date: 08/04/2013 Discharge date: 08/10/2013  Time spent: >30 minutes  Recommendations for Outpatient Follow-up:  Basic metabolic panel in 5 days to follow electrolytes and renal function  Discharge Diagnoses:  Acute respiratory failure secondary to acute on chronic CHF (combined systolic and diastolic heart failure). Anemia of chronic disease Hypertension GERD Chronic atrial fibrillation Mitral regurgitation   Discharge Condition: Stable and improved. At discharge patient denies shortness of breath or chest pain. Dry weight at discharge 105 kg  Diet recommendation: Low-sodium/heart healthy diet.  Filed Weights   08/08/13 0536 08/09/13 0417 08/10/13 0543  Weight: 110.5 kg (243 lb 9.7 oz) 107.2 kg (236 lb 5.3 oz) 105.4 kg (232 lb 5.8 oz)    History of present illness:  79 year old female, nursing home resident with history of combined systolic and diastolic congestive heart failure, atrial fibrillation not a candidate for long-term anticoagulation due to recent GI bleeding with hospitalization for pneumonia, hypertension presents to the ER from the nursing home with shortness of breath. The patient is unable to provide any meaningful history and the patient's daughter Enid Derry bedside. She is also the POA. According to her the patient has had changes made in her medications were decreased and her ACE inhibitor and her Lasix, because of low blood pressure chronically and over the last week or so the patient has gained about 10 pounds. She has a mild nonproductive cough dependent edema which has been worsening over the last 2 days. The patient is nonambulatory since April, she can pivot from bed to chair.  In the ER the patient was placed on BiPAP, received IV Lasix. Currently off BiPAP and maintaining oxygen saturation on nasal cannula.   Hospital Course:  1-  Acute resp failure due to acute on chronic CHF exacerbation (combined systolic/diastolic dysfunction)  -Patient with great diuresis during admission with combination of lasix and metolazone -at discharge plan is to use lasix 60mg  BID -patient will follow with heart failure clinic in 1 week -continue daily weights, low sodium diet and fluid restriction -weight at discharge 105Kg -Creatinine slightly elevated with diuresis; will follow BMET in 5 days  -Continue beta blocker and lisinopril   2-anemia of chronic disease  -Hemoglobin remained stable and there has not been any signs of overt bleeding  -Plan is to continue ferrous sulfate as an outpatient   3-GERD: Continue PPI   4-hypertension: Stable. Will continue current medication regimen and adjust medications as an outpatient as needed  5-chronic atrial fibrillation: Continue beta blocker. Rate is controlled and stable at this moment.  -No chronic anticoagulation given history of GI bleed in the past   *Rest of medical problems remains stable during this hospitalization, plan is to continue current medication regimen and follow with primary care physician for medication adjustment as needed.  Procedures:  See below for x-ray reports   Consultations:  None   Discharge Exam: Filed Vitals:   08/10/13 1001  BP: 112/57  Pulse: 79  Temp:   Resp:    General: Alert, awake and oriented x3; patient is afebrile and denies CP or SOB. Cardiovascular: Regular controlled, positive soft systolic ejection murmur; no rubs or gallops. Trace lower extremity edema bilaterally  Respiratory: improved breath sounds at her bases bilaterally; no wheezing and no frank crackles  Abdomen: Soft, nontender, nondistended, no rebound; positive bowel sounds  Musculoskeletal: No cyanosis or clubbing    Discharge Instructions You were  cared for by a hospitalist during your hospital stay. If you have any questions about your discharge medications or the  care you received while you were in the hospital after you are discharged, you can call the unit and asked to speak with the hospitalist on call if the hospitalist that took care of you is not available. Once you are discharged, your primary care physician will handle any further medical issues. Please note that NO REFILLS for any discharge medications will be authorized once you are discharged, as it is imperative that you return to your primary care physician (or establish a relationship with a primary care physician if you do not have one) for your aftercare needs so that they can reassess your need for medications and monitor your lab values.  Discharge Instructions   Diet - low sodium heart healthy    Complete by:  As directed      Discharge instructions    Complete by:  As directed   -check your weight on daily basis -take medications as prescribed -follow a low sodium diet (< 2 gram daily) -BMET in 5 days -fluid restriction (max 2L daily) -follow up with PCP in 10 days            Medication List    STOP taking these medications       omeprazole 20 MG capsule  Commonly known as:  PRILOSEC  Replaced by:  pantoprazole 40 MG tablet      TAKE these medications       albuterol (2.5 MG/3ML) 0.083% nebulizer solution  Commonly known as:  PROVENTIL  Take 2.5 mg by nebulization every 8 (eight) hours as needed for wheezing or shortness of breath.     carvedilol 3.125 MG tablet  Commonly known as:  COREG  Take 3.125 mg by mouth 2 (two) times daily with a meal. Hold if SBP <100     feeding supplement (PRO-STAT SUGAR FREE 64) Liqd  Take 30 mLs by mouth daily.     FERRETTS 325 (106 FE) MG Tabs tablet  Generic drug:  ferrous fumarate  Take 1 tablet by mouth 2 (two) times daily. 8am and 4pm     furosemide 20 MG tablet  Commonly known as:  LASIX  Take 3 tablets (60 mg total) by mouth 2 (two) times daily.     HYDROcodone-acetaminophen 5-325 MG per tablet  Commonly known as:   NORCO/VICODIN  Take 1 tablet by mouth every 8 (eight) hours as needed (pain).     lisinopril 5 MG tablet  Commonly known as:  PRINIVIL,ZESTRIL  Take 2.5 mg by mouth daily.     lubiprostone 24 MCG capsule  Commonly known as:  AMITIZA  Take 1 capsule (24 mcg total) by mouth 2 (two) times daily with a meal.     magnesium hydroxide 400 MG/5ML suspension  Commonly known as:  MILK OF MAGNESIA  Take 30 mLs by mouth daily as needed for mild constipation.     pantoprazole 40 MG tablet  Commonly known as:  PROTONIX  Take 1 tablet (40 mg total) by mouth 2 (two) times daily.     potassium chloride SA 20 MEQ tablet  Commonly known as:  K-DUR,KLOR-CON  Take 1 tablet (20 mEq total) by mouth daily.     predniSONE 20 MG tablet  Commonly known as:  DELTASONE  Take 40 mg by mouth daily with breakfast.     promethazine 25 MG tablet  Commonly known as:  PHENERGAN  Take  25 mg by mouth every 6 (six) hours as needed for nausea or vomiting.     traMADol 50 MG tablet  Commonly known as:  ULTRAM  Take 1 tablet (50 mg total) by mouth every 8 (eight) hours as needed for moderate pain.     Vitamin D (Ergocalciferol) 50000 UNITS Caps capsule  Commonly known as:  DRISDOL  Take 50,000 Units by mouth every 7 (seven) days. On Sundays       Allergies  Allergen Reactions  . Levaquin [Levofloxacin] Other (See Comments)    Per MAR  . Penicillins Other (See Comments)    Per MAR  . Tetanus-Diphtheria Toxoids Td Other (See Comments)    Per MAR  . Latex Hives and Rash       Follow-up Information   Follow up with [facility] In 1 week. (hospital follow up)    Specialty:  Cardiology   Contact information:   [address] [id number] [place]  [address] [phone]      Follow up with Osborne Casco, MD. Schedule an appointment as soon as possible for a visit in 10 days.   Specialty:  Family Medicine   Contact information:   [address] [place] [address] [phone]       The results of significant diagnostics from this hospitalization (including imaging, microbiology, ancillary and laboratory) are listed below for reference.    Significant Diagnostic Studies: Ct Abdomen Pelvis W Contrast  07/13/2013   CLINICAL DATA:  Abdominal pain  EXAM: CT ABDOMEN AND PELVIS WITH CONTRAST  TECHNIQUE: Multidetector CT imaging of the abdomen and pelvis was performed using the standard protocol following bolus administration of intravenous contrast.  CONTRAST:  18mL OMNIPAQUE IOHEXOL 300 MG/ML  SOLN  COMPARISON:  CT abdomen pelvis dated 06/03/2013, 12/27/2010  FINDINGS: Areas of linear density within the lung bases differential considerations are scarring versus atelectasis. The heart is enlarged.  The liver, spleen, adrenals, pancreas is unremarkable. Stable benign appearing bilateral renal cysts.  No abdominal or pelvic masses, free fluid, loculated fluid collections, nor adenopathy.  There is no evidence of an abdominal aortic aneurysm. Celiac, SMA, IMA, portal vein are opacified. Atherosclerotic calcifications are appreciated within the aorta and mesenteric vessels.  Gallbladder fossa is unremarkable. The bowel is negative. A moderate amount of stool within the colon.  A very small fat containing umbilical hernia. No inguinal hernia appreciated.  Areas of spondylosis within the thoracic spine. No aggressive appearing osseous lesions.  IMPRESSION: Stable benign appearing Bosniak category 1 cysts within the kidneys.  Moderate amount of fecal retention within the colon  Otherwise no evidence of abdominal or pelvic pathology.  Atelectasis versus scarring within the lung bases.   Electronically Signed   By: Margaree Mackintosh M.D.   On: 07/13/2013 15:43   Dg Chest Port 1 View  08/04/2013   CLINICAL DATA:  Shortness of breath  EXAM: PORTABLE CHEST - 1 VIEW  COMPARISON:  06/19/2013  FINDINGS: Chronic cardiomegaly and mild aortic  tortuosity. There is new diffuse interstitial coarsening compatible with pulmonary edema. There is chronic elevation the left diaphragm with streaky left mid and lower lung opacities, most consistent with atelectasis. No effusion or pneumothorax.  IMPRESSION: 1. CHF. 2. Left basilar atelectasis.   Electronically Signed   By: Jorje Guild M.D.   On: 08/04/2013 04:35    Microbiology: Recent Results (from the past 240 hour(s))  MRSA PCR SCREENING     Status:  None   Collection Time    08/05/13  7:14 PM      Result Value Ref Range Status   MRSA by PCR NEGATIVE  NEGATIVE Final   Comment:            The GeneXpert MRSA Assay (FDA     approved for NASAL specimens     only), is one component of a     comprehensive MRSA colonization     surveillance program. It is not     intended to diagnose MRSA     infection nor to guide or     monitor treatment for     MRSA infections.     Labs: Basic Metabolic Panel:  Recent Labs Lab 08/06/13 0300 08/07/13 0435 08/08/13 0504 08/09/13 0357 08/10/13 0417  NA 137 140 136* 133* 131*  K 3.9 4.0 3.7 3.5* 3.2*  CL 95* 98 93* 88* 82*  CO2 32 33* 34* 32 35*  GLUCOSE 83 81 80 87 87  BUN 23 18 19  28* 34*  CREATININE 1.01 0.89 0.93 1.04 1.21*  CALCIUM 8.4 8.3* 8.5 8.5 8.9   Liver Function Tests:  Recent Labs Lab 08/05/13 0800  AST 33  ALT 86*  ALKPHOS 120*  BILITOT 0.3  PROT 6.0  ALBUMIN 2.7*   CBC:  Recent Labs Lab 08/04/13 0322 08/04/13 0327 08/04/13 0811 08/05/13 0800 08/09/13 0357  WBC 19.9*  --  13.2* 10.0 10.7*  NEUTROABS 18.1*  --   --  8.2*  --   HGB 10.1* 11.6* 10.0* 10.2* 10.4*  HCT 32.5* 34.0* 31.6* 32.5* 33.4*  MCV 97.6  --  98.1 97.0 96.0  PLT 308  --  277 280 280   BNP (last 3 results)  Recent Labs  06/03/13 1825 06/22/13 0621 08/04/13 0322  PROBNP 27115.0* 6129.0* 4718.0*    Signed:  Barton Dubois  [facility] 08/10/2013, 1:33 PM

## 2013-08-13 ENCOUNTER — Non-Acute Institutional Stay (SKILLED_NURSING_FACILITY): Payer: Medicare Other | Admitting: Internal Medicine

## 2013-08-13 ENCOUNTER — Encounter: Payer: Self-pay | Admitting: Internal Medicine

## 2013-08-13 DIAGNOSIS — I10 Essential (primary) hypertension: Secondary | ICD-10-CM

## 2013-08-13 DIAGNOSIS — I5043 Acute on chronic combined systolic (congestive) and diastolic (congestive) heart failure: Secondary | ICD-10-CM

## 2013-08-13 DIAGNOSIS — I4891 Unspecified atrial fibrillation: Secondary | ICD-10-CM

## 2013-08-13 DIAGNOSIS — D509 Iron deficiency anemia, unspecified: Secondary | ICD-10-CM

## 2013-08-13 DIAGNOSIS — J9600 Acute respiratory failure, unspecified whether with hypoxia or hypercapnia: Secondary | ICD-10-CM

## 2013-08-13 DIAGNOSIS — I509 Heart failure, unspecified: Secondary | ICD-10-CM

## 2013-08-13 DIAGNOSIS — K219 Gastro-esophageal reflux disease without esophagitis: Secondary | ICD-10-CM

## 2013-08-13 DIAGNOSIS — I482 Chronic atrial fibrillation, unspecified: Secondary | ICD-10-CM

## 2013-08-13 DIAGNOSIS — J9601 Acute respiratory failure with hypoxia: Secondary | ICD-10-CM

## 2013-08-13 NOTE — Assessment & Plan Note (Addendum)
Acute resp failure due to acute on chronic CHF exacerbation (combined systolic/diastolic dysfunction)  -Patient with great diuresis during admission with combination of lasix and metolazone  -at discharge plan is to use lasix 60mg  BID  -patient will follow with heart failure clinic in 1 week  -continue daily weights, low sodium diet and 2000cc  fluid restriction  -weight at discharge 105Kg  -Creatinine slightly elevated with diuresis; will follow BMET in 5 days  -Continue beta blocker and lisinopril

## 2013-08-13 NOTE — Assessment & Plan Note (Signed)
Continue beta blocker. Rate is controlled and stable at this moment.  -No chronic anticoagulation given history of GI bleed in the past

## 2013-08-13 NOTE — Assessment & Plan Note (Signed)
Continue omeprazole 

## 2013-08-13 NOTE — Assessment & Plan Note (Signed)
-  Hemoglobin remained stable and there has not been any signs of overt bleeding  -Plan is to continue ferrous sulfate as an outpatient

## 2013-08-13 NOTE — Assessment & Plan Note (Signed)
2/2 exacerbation of combined CHF

## 2013-08-13 NOTE — Assessment & Plan Note (Signed)
Continue lasix, coreg, lisnopril

## 2013-08-13 NOTE — Progress Notes (Signed)
MRN: [id number] Name: [name]  Sex: female Age: 79 y.o. DOB: 12/12/1934  [place] #: [name]  Facility/Room: 219 Level Of Care: SNF Provider: Inocencio Homes D Emergency Contacts: Extended Emergency Contact Information Primary Emergency Contact: Gawron,Reggie Address: [place], [place] [address] [place] Phone: [phone] Work Phone: [phone] Relation: Son Secondary Emergency Contact: Ruiter,[name]          [place], [place] Phone: [phone] Relation: Daughter  Code Status:FULL   Allergies: Levaquin; Penicillins; Tetanus-diphtheria toxoids td; and Latex  Chief Complaint  Patient presents with  . nursing home admission    HPI: Patient is 79 y.o. female who is admitted to SNF after an acute CHF exacerbation with NY class stage 4 dx.  Past Medical History  Diagnosis Date  . HTN (hypertension)   . Diverticulosis   . Hemorrhoid   . Osteoarthritis   . Constipation, chronic   . H/O: hysterectomy   . Systolic and diastolic CHF, chronic     a. Echo 07/2011: Technically limited; inferior HK, inferolateral and possibly lateral wall HK, EF 40%, moderate LVE, aortic sclerosis without stenosis, mild MR, mild LAE, question RV dysfunction;   b.  Echo (04/2013): EF 50-55%, normal wall motion, moderate MR, mild to moderate LAE, PASP 37  . Female bladder prolapse     with prolapse uterus, cystocele, s/p TVH/BSO 12/2006  . Obesity (BMI 30-39.9)     wt. 94.1 kg 12/2010  . Moderate mitral regurgitation     a. Mod to Sev by echo 01/2009;  b. mild MR by echo 2013  . Atrial fibrillation 10/13/2011    coumadin Rx  . Iron deficiency anemia     Past Surgical History  Procedure Laterality Date  . Wrist surgery    . Vaginal hysterectomy,cystocele and prolapse  repair  NOV. 2008  . Abdominal hysterectomy    . Colonoscopy N/A 07/18/2013    Procedure: COLONOSCOPY;  Surgeon: Wonda Horner, MD;  Location: [facility]  END[facility];  Service: Endoscopy;  Laterality: N/A;      Medication List       This list is accurate as of: 08/13/13  2:58 PM.  Always use your most recent med list.               albuterol (2.5 MG/3ML) 0.083% nebulizer solution  Commonly known as:  PROVENTIL  Take 2.5 mg by nebulization every 8 (eight) hours as needed for wheezing or shortness of breath.     carvedilol 3.125 MG tablet  Commonly known as:  COREG  Take 3.125 mg by mouth 2 (two) times daily with a meal. Hold if SBP <100     feeding supplement (PRO-STAT SUGAR FREE 64) Liqd  Take 30 mLs by mouth daily.     FERRETTS 325 (106 FE) MG Tabs tablet  Generic drug:  ferrous fumarate  Take 1 tablet by mouth 2 (two) times daily. 8am and 4pm     furosemide 20 MG tablet  Commonly known as:  LASIX  Take 3 tablets (60 mg total) by mouth 2 (two) times daily.     HYDROcodone-acetaminophen 5-325 MG per tablet  Commonly known as:  NORCO/VICODIN  Take 1 tablet by mouth every 8 (eight) hours as needed (pain).     lisinopril 5 MG tablet  Commonly known as:  PRINIVIL,ZESTRIL  Take 2.5 mg by mouth daily.     lubiprostone 24 MCG capsule  Commonly known as:  AMITIZA  Take 1 capsule (24 mcg total) by mouth 2 (two) times daily with a meal.     magnesium hydroxide 400 MG/5ML suspension  Commonly known as:  MILK OF MAGNESIA  Take 30 mLs by mouth daily as needed for mild constipation.     pantoprazole 40 MG tablet  Commonly known as:  PROTONIX  Take 1 tablet (40 mg total) by mouth 2 (two) times daily.     potassium chloride SA 20 MEQ tablet  Commonly known as:  K-DUR,KLOR-CON  Take 1 tablet (20 mEq total) by mouth daily.     predniSONE 20 MG tablet  Commonly known as:  DELTASONE  Take 40 mg by mouth daily with breakfast.     promethazine 25 MG tablet  Commonly known as:  PHENERGAN  Take 25 mg by mouth every 6 (six) hours as needed for nausea or vomiting.     traMADol 50 MG tablet  Commonly known as:  ULTRAM  Take 1 tablet  (50 mg total) by mouth every 8 (eight) hours as needed for moderate pain.     Vitamin D (Ergocalciferol) 50000 UNITS Caps capsule  Commonly known as:  DRISDOL  Take 50,000 Units by mouth every 7 (seven) days. On Sundays        No orders of the defined types were placed in this encounter.     There is no immunization history on file for this patient.  History  Substance Use Topics  . Smoking status: Never Smoker   . Smokeless tobacco: Current User    Types: Snuff     Comment: Uses Snuff regularly.  Dips several x/day.  A "large can" can last her up to 3 mos.  . Alcohol Use: No    Family history is noncontributory    Review of Systems  DATA OBTAINED: from patient GENERAL: Feels well no fevers, fatigue, appetite changes SKIN: No itching, rash or wounds EYES: No eye pain, redness, discharge EARS: No earache, tinnitus, change in hearing NOSE: No congestion, drainage or bleeding  MOUTH/THROAT: No mouth or tooth pain, No sore throat  RESPIRATORY: No cough, wheezing, SOB CARDIAC: No chest pain, palpitations, lower extremity edema  GI: No abdominal pain, No N/V/D or constipation, No heartburn or reflux  GU: No dysuria, frequency or urgency, or incontinence  MUSCULOSKELETAL: No unrelieved bone/joint pain NEUROLOGIC: No headache, dizziness or focal weakness PSYCHIATRIC: No overt anxiety or sadness. Sleeps well. No behavior issue.   Filed Vitals:   08/13/13 1445  BP: 117/66  Pulse: 75  Temp: 98.5 F (36.9 C)  Resp: 16    Physical Exam  GENERAL APPEARANCE: Alert, mod conversant. Appropriately groomed. No acute distress.  SKIN: No diaphoresis rash HEAD: Normocephalic, atraumatic  EYES: Conjunctiva/lids clear. Pupils round, reactive. EOMs intact.  EARS: External exam WNL, canals clear. Hearing grossly normal.  NOSE: No deformity or discharge.  MOUTH/THROAT: Lips w/o lesions.  RESPIRATORY: Breathing is even, unlabored. Lung sounds are clear   CARDIOVASCULAR: Heart RRR no  murmurs, rubs or gallops. No peripheral edema.   GASTROINTESTINAL: Abdomen is soft, non-tender, not distended w/ normal bowel sounds GENITOURINARY: Bladder non tender, not distended  MUSCULOSKELETAL: No abnormal joints or musculature NEUROLOGIC: . Cranial nerves 2-12 grossly intact. Moves all extremities PSYCHIATRIC: Mood and affect appropriate to situation, no behavioral issues  Patient Active Problem List   Diagnosis Date Noted  . Acute respiratory failure 08/13/2013  . GERD (gastroesophageal reflux disease) 08/13/2013  . CHF (congestive heart failure) 08/04/2013  .  Congestive heart failure, NYHA class 4 08/04/2013  . GI bleed 07/13/2013  . History of lower GI bleeding 07/11/2013  . AKI (acute kidney injury) 06/29/2013  . Acute lower GI bleeding 06/23/2013  . PNA (pneumonia) 06/19/2013  . Rash and nonspecific skin eruption 06/19/2013  . Chronic respiratory failure with hypoxia 06/18/2013  . Acute encephalopathy 06/07/2013  . Diastolic CHF 71/24/5809  . Pulmonary hypertension 06/05/2013  . Weakness 06/03/2013  . Encounter for therapeutic drug monitoring 04/10/2013  . Long term current use of anticoagulant 10/26/2011  . Hypokalemia 10/15/2011  . Abdominal pain 10/15/2011  . Iron deficiency anemia 10/15/2011  . Bladder prolapse, female, acquired 10/15/2011  . Atrial fibrillation 10/15/2011  . Acute on chronic combined systolic and diastolic congestive heart failure 07/22/2011  . Facial edema 12/30/2010  . Leukopenia 12/30/2010  . Neutropenia 12/30/2010  . Abdominal pain, unspecified site 12/27/2010  . Chronic pain 12/25/2010  . Hematochezia 12/25/2010  . Dehydration 12/25/2010  . Thrombocytosis 12/25/2010  . Hyponatremia 12/25/2010  . Systolic and diastolic CHF, chronic   . Female bladder prolapse   . Mitral regurgitation 06/04/2010  . HYPOPOTASSEMIA 09/24/2009  . COMBINED HEART FAILURE, CHRONIC 09/09/2009  . CARDIOMYOPATHY 03/04/2009  . ANASARCA 03/04/2009  .  HYPERTENSION 02/20/2009  . CONSTIPATION, CHRONIC 02/20/2009  . DIVERTICULOSIS 02/20/2009    CBC    Component Value Date/Time   WBC 10.7* 08/09/2013 0357   RBC 3.48* 08/09/2013 0357   RBC 1.95* 06/23/2013 1118   HGB 10.4* 08/09/2013 0357   HCT 33.4* 08/09/2013 0357   PLT 280 08/09/2013 0357   MCV 96.0 08/09/2013 0357   LYMPHSABS 1.3 08/05/2013 0800   MONOABS 0.4 08/05/2013 0800   EOSABS 0.1 08/05/2013 0800   BASOSABS 0.0 08/05/2013 0800    CMP     Component Value Date/Time   NA 131* 08/10/2013 0417   K 3.2* 08/10/2013 0417   CL 82* 08/10/2013 0417   CO2 35* 08/10/2013 0417   GLUCOSE 87 08/10/2013 0417   BUN 34* 08/10/2013 0417   CREATININE 1.21* 08/10/2013 0417   CALCIUM 8.9 08/10/2013 0417   PROT 6.0 08/05/2013 0800   ALBUMIN 2.7* 08/05/2013 0800   AST 33 08/05/2013 0800   ALT 86* 08/05/2013 0800   ALKPHOS 120* 08/05/2013 0800   BILITOT 0.3 08/05/2013 0800   GFRNONAA 42* 08/10/2013 0417   GFRAA 49* 08/10/2013 0417    Assessment and Plan  Acute on chronic combined systolic and diastolic congestive heart failure Acute resp failure due to acute on chronic CHF exacerbation (combined systolic/diastolic dysfunction)  -Patient with great diuresis during admission with combination of lasix and metolazone  -at discharge plan is to use lasix 60mg  BID  -patient will follow with heart failure clinic in 1 week  -continue daily weights, low sodium diet and 2000cc  fluid restriction  -weight at discharge 105Kg  -Creatinine slightly elevated with diuresis; will follow BMET in 5 days  -Continue beta blocker and lisinopril    Acute respiratory failure 2/2 exacerbation of combined CHF  Iron deficiency anemia -Hemoglobin remained stable and there has not been any signs of overt bleeding  -Plan is to continue ferrous sulfate as an outpatient    Atrial fibrillation Continue beta blocker. Rate is controlled and stable at this moment.  -No chronic anticoagulation given history of GI bleed in the past     HYPERTENSION Continue lasix, coreg, lisnopril  GERD (gastroesophageal reflux disease) Continue omeprazole    Hennie Duos, MD

## 2013-08-14 NOTE — Progress Notes (Signed)
Patient ID: [name], female   DOB: [birth year], 79 y.o.   MRN: [id number]  PCP: Dr Laurann [place]   HPI: 79 year old female, nursing home resident with history of combined systolic and diastolic congestive heart failure, chronic respiratory failure on 2 liters [place]., chronic atrial fibrillation not a candidate for long-term anticoagulation due to recent GI bleeding  (May 2015) , with hospitalization for pneumonia in May 2015, and hypertension. She has been hospitalized  5 times in the last 6 months.   Admitted to [facility] 6/29 through 08/10/13 with respiratory failure and volume overload. Diuresed with IV lasix and transitioned to lasix 60 mg tiwce a day. Discharge weight was 232 pounds. She was discharged to [facility].   She returns for post hospital follow up in a wheelchair with her daughter. . Able to stand with assistance. Daughter present. Remains SOB with exertion. + Orthopnea. Drinking > 2 liters  Not on low salt diet. Medications provided by SNF. Tries to work with therapy but not making much progress.   Labs 08/09/13 K 3.5 Creatinine 1.0  Labs 08/10/13 K 3.2 Creatinine 1.2   SH: Currently at [facility]   ROS: All systems negative except as listed in HPI, PMH and Problem List.  Past Medical History  Diagnosis Date  . HTN (hypertension)   . Diverticulosis   . Hemorrhoid   . Osteoarthritis   . Constipation, chronic   . H/O: hysterectomy   . Systolic and diastolic CHF, chronic     a. Echo 07/2011: Technically limited; inferior HK, inferolateral and possibly lateral wall HK, EF 40%, moderate LVE, aortic sclerosis without stenosis, mild MR, mild LAE, question RV dysfunction;   b.  Echo (04/2013): EF 50-55%, normal wall motion, moderate MR, mild to moderate LAE, PASP 37  . Female bladder prolapse     with prolapse uterus, cystocele, s/p TVH/BSO 12/2006  . Obesity (BMI 30-39.9)     wt. 94.1 kg 12/2010  . Moderate mitral regurgitation     a. Mod to Sev by echo 01/2009;  b. mild MR by echo 2013   . Atrial fibrillation 10/13/2011    coumadin Rx  . Iron deficiency anemia     Current Outpatient Prescriptions  Medication Sig Dispense Refill  . albuterol (PROVENTIL) (2.5 MG/3ML) 0.083% nebulizer solution Take 2.5 mg by nebulization every 8 (eight) hours as needed for wheezing or shortness of breath.      . Amino Acids-Protein Hydrolys (FEEDING SUPPLEMENT, PRO-STAT SUGAR FREE 64,) LIQD Take 30 mLs by mouth daily.       . carvedilol (COREG) 3.125 MG tablet Take 3.125 mg by mouth 2 (two) times daily with a meal. Hold if SBP <100      . ferrous fumarate (FERRETTS) 325 (106 FE) MG TABS Take 1 tablet by mouth 2 (two) times daily. 8am and 4pm      . furosemide (LASIX) 20 MG tablet Take 3 tablets (60 mg total) by mouth 2 (two) times daily.      Marland Kitchen HYDROcodone-acetaminophen (NORCO/VICODIN) 5-325 MG per tablet Take 1 tablet by mouth every 8 (eight) hours as needed (pain).  30 tablet  0  . lisinopril (PRINIVIL,ZESTRIL) 5 MG tablet Take 2.5 mg by mouth daily.       Marland Kitchen lubiprostone (AMITIZA) 24 MCG capsule Take 1 capsule (24 mcg total) by mouth 2 (two) times daily with a meal.  30 capsule  0  . magnesium hydroxide (MILK OF MAGNESIA) 400 MG/5ML suspension Take 30 mLs by mouth daily as  needed for mild constipation.      . pantoprazole (PROTONIX) 40 MG tablet Take 1 tablet (40 mg total) by mouth 2 (two) times daily.      . potassium chloride SA (K-DUR,KLOR-CON) 20 MEQ tablet Take 1 tablet (20 mEq total) by mouth daily.      . predniSONE (DELTASONE) 20 MG tablet Take 40 mg by mouth daily with breakfast.      . promethazine (PHENERGAN) 25 MG tablet Take 25 mg by mouth every 6 (six) hours as needed for nausea or vomiting.      . traMADol (ULTRAM) 50 MG tablet Take 1 tablet (50 mg total) by mouth every 8 (eight) hours as needed for moderate pain.  30 tablet  0  . Vitamin D, Ergocalciferol, (DRISDOL) 50000 UNITS CAPS capsule Take 50,000 Units by mouth every 7 (seven) days. On Sundays       No current  facility-administered medications for this encounter.     PHYSICAL EXAM: Filed Vitals:   08/15/13 1028  BP: 107/57  Pulse: 81  Resp: 20  Weight: 230 lb 8 oz (104.554 kg)  SpO2: 100%    General:  Chronically ill appearing. No resp difficulty on 2 liters [place]. Daughter present HEENT: normal Neck: supple. JVP 5-6. Carotids 2+ bilaterally; no bruits. No lymphadenopathy or thryomegaly appreciated. Cor: PMI normal. irregular rate & rhythm. No rubs, gallops or murmurs. Lungs: Decreased in the bases on 2 liters [place].  Abdomen: obese,soft, nontender, nondistended. No hepatosplenomegaly. No bruits or masses. Good bowel sounds. Extremities: no cyanosis, clubbing, rash, edema Neuro: alert & orientedx3, cranial nerves grossly intact. Moves all 4 extremities w/o difficulty. Affect pleasant.      ASSESSMENT & PLAN:  1.  Chronic Diastolic Heart Failure Volume status stable. Continue lasix 60 mg twice a day and take and extra 40 mg of lasix if her weight is 234 pounds or greater in 24 hours.  Check BMET now.  I have provided recommendations for daily weights, low salt diet, and limiting fluid intake to < 2 liters daily.   2. HTN  - Stable. Continue current medications.  3. Chronic A fib - On carvedilol 3.125 mg twice a day. Not on anticoagulants due to GI bleeds. Most recent GI bleed 06/2013 .  4. Immobility- Continue PT at SNF  5. H/O GI bleeds- on Ferrous Sulfate and Protonix. Per primary doctor at [facility].   Follow up in 2 weeks to reassess volume status.   CLEGG,AMY NP-C  1:03 PM

## 2013-08-14 NOTE — Progress Notes (Signed)
Clinical Social Worker facilitated patient discharge including contacting patient family and facility to confirm patient discharge plans.  Clinical information faxed to facility and family agreeable with plan. CSW arranged ambulance transport via [place] back to [place]. CSW notified patient's daughter Vergie Living. . RN to call report prior to discharge. Clinical Social Worker will sign off for now as social work intervention is no longer needed.  Kendell Bane, [place]

## 2013-08-15 ENCOUNTER — Encounter (HOSPITAL_COMMUNITY): Payer: Self-pay

## 2013-08-15 ENCOUNTER — Ambulatory Visit (HOSPITAL_COMMUNITY)
Admission: RE | Admit: 2013-08-15 | Discharge: 2013-08-15 | Disposition: A | Discharge status: Skilled Nursing Facility | Payer: Medicare Other | Source: Ambulatory Visit | Attending: Internal Medicine | Admitting: Internal Medicine

## 2013-08-15 VITALS — BP 107/57 | HR 81 | Resp 20 | Wt 230.5 lb

## 2013-08-15 DIAGNOSIS — I10 Essential (primary) hypertension: Secondary | ICD-10-CM | POA: Diagnosis not present

## 2013-08-15 DIAGNOSIS — I4891 Unspecified atrial fibrillation: Secondary | ICD-10-CM | POA: Insufficient documentation

## 2013-08-15 DIAGNOSIS — I509 Heart failure, unspecified: Secondary | ICD-10-CM | POA: Diagnosis not present

## 2013-08-15 DIAGNOSIS — K5900 Constipation, unspecified: Secondary | ICD-10-CM | POA: Insufficient documentation

## 2013-08-15 DIAGNOSIS — I059 Rheumatic mitral valve disease, unspecified: Secondary | ICD-10-CM | POA: Insufficient documentation

## 2013-08-15 DIAGNOSIS — R69 Illness, unspecified: Secondary | ICD-10-CM

## 2013-08-15 DIAGNOSIS — Z8719 Personal history of other diseases of the digestive system: Secondary | ICD-10-CM | POA: Diagnosis not present

## 2013-08-15 DIAGNOSIS — E669 Obesity, unspecified: Secondary | ICD-10-CM | POA: Insufficient documentation

## 2013-08-15 DIAGNOSIS — N993 Prolapse of vaginal vault after hysterectomy: Secondary | ICD-10-CM | POA: Diagnosis not present

## 2013-08-15 DIAGNOSIS — Z90710 Acquired absence of both cervix and uterus: Secondary | ICD-10-CM | POA: Diagnosis not present

## 2013-08-15 DIAGNOSIS — I5042 Chronic combined systolic (congestive) and diastolic (congestive) heart failure: Secondary | ICD-10-CM | POA: Diagnosis not present

## 2013-08-15 DIAGNOSIS — D509 Iron deficiency anemia, unspecified: Secondary | ICD-10-CM | POA: Diagnosis not present

## 2013-08-15 DIAGNOSIS — Z6830 Body mass index (BMI) 30.0-30.9, adult: Secondary | ICD-10-CM | POA: Insufficient documentation

## 2013-08-15 DIAGNOSIS — I5032 Chronic diastolic (congestive) heart failure: Secondary | ICD-10-CM | POA: Insufficient documentation

## 2013-08-15 DIAGNOSIS — M1990 Unspecified osteoarthritis, unspecified site: Secondary | ICD-10-CM | POA: Diagnosis not present

## 2013-08-15 DIAGNOSIS — Z7409 Other reduced mobility: Secondary | ICD-10-CM | POA: Insufficient documentation

## 2013-08-15 LAB — BASIC METABOLIC PANEL
ANION GAP: 16 — AB (ref 5–15)
BUN: 52 mg/dL — ABNORMAL HIGH (ref 6–23)
CHLORIDE: 93 meq/L — AB (ref 96–112)
CO2: 26 meq/L (ref 19–32)
CREATININE: 1.11 mg/dL — AB (ref 0.50–1.10)
Calcium: 8.8 mg/dL (ref 8.4–10.5)
GFR calc Af Amer: 54 mL/min — ABNORMAL LOW (ref >90)
GFR calc non Af Amer: 47 mL/min — ABNORMAL LOW (ref >90)
Glucose, Bld: 103 mg/dL — ABNORMAL HIGH (ref 70–99)
Potassium: 4.2 mEq/L (ref 3.7–5.3)
SODIUM: 135 meq/L — AB (ref 137–147)

## 2013-08-24 ENCOUNTER — Ambulatory Visit (INDEPENDENT_AMBULATORY_CARE_PROVIDER_SITE_OTHER): Payer: Medicare Other | Admitting: Cardiology

## 2013-08-24 ENCOUNTER — Encounter: Payer: Self-pay | Admitting: Cardiology

## 2013-08-24 VITALS — BP 110/60 | HR 76 | Ht 61.0 in | Wt 232.0 lb

## 2013-08-24 DIAGNOSIS — I482 Chronic atrial fibrillation, unspecified: Secondary | ICD-10-CM

## 2013-08-24 DIAGNOSIS — I10 Essential (primary) hypertension: Secondary | ICD-10-CM

## 2013-08-24 DIAGNOSIS — I4891 Unspecified atrial fibrillation: Secondary | ICD-10-CM

## 2013-08-24 DIAGNOSIS — I5042 Chronic combined systolic (congestive) and diastolic (congestive) heart failure: Secondary | ICD-10-CM

## 2013-08-24 DIAGNOSIS — I509 Heart failure, unspecified: Secondary | ICD-10-CM

## 2013-08-24 NOTE — Progress Notes (Signed)
HPI The patient presents for followup of her mixed heart failure.   She has had multiple readmissions for volume overload. I have reviewed the most recent admission. She has since been seen in the heart failure clinic. She is now staying in a nursing home but might leave in a couple of weeks. Of note before she was in a nursing home she said that her scale and stopped weighing herself daily. Even in the nursing home she was drinking too much and that she had fluid restriction placed. Her goal is to maintain a weight lower than 234 and at this level she seems to do well with her breathing. She denies any acute cardiovascular symptoms. She says "I hurt all over". However, she's not having any new PND or orthopnea. She's able to handle in about 300 feet with physical therapy.  Allergies  Allergen Reactions  . Levaquin [Levofloxacin] Other (See Comments)    Per MAR  . Penicillins Other (See Comments)    Per MAR  . Tetanus-Diphtheria Toxoids Td Other (See Comments)    Per MAR  . Latex Hives and Rash    Current Outpatient Prescriptions  Medication Sig Dispense Refill  . albuterol (PROVENTIL) (2.5 MG/3ML) 0.083% nebulizer solution Take 2.5 mg by nebulization every 8 (eight) hours as needed for wheezing or shortness of breath.      . Amino Acids-Protein Hydrolys (FEEDING SUPPLEMENT, PRO-STAT SUGAR FREE 64,) LIQD Take 30 mLs by mouth daily.       . carvedilol (COREG) 3.125 MG tablet Take 3.125 mg by mouth 2 (two) times daily with a meal. Hold if SBP <100      . ferrous fumarate (FERRETTS) 325 (106 FE) MG TABS Take 1 tablet by mouth 2 (two) times daily. 8am and 4pm      . furosemide (LASIX) 20 MG tablet Take 3 tablets (60 mg total) by mouth 2 (two) times daily.      Marland Kitchen HYDROcodone-acetaminophen (NORCO/VICODIN) 5-325 MG per tablet Take 1 tablet by mouth every 8 (eight) hours as needed (pain).  30 tablet  0  . lisinopril (PRINIVIL,ZESTRIL) 5 MG tablet Take 2.5 mg by mouth daily.       Marland Kitchen lubiprostone  (AMITIZA) 24 MCG capsule Take 1 capsule (24 mcg total) by mouth 2 (two) times daily with a meal.  30 capsule  0  . magnesium hydroxide (MILK OF MAGNESIA) 400 MG/5ML suspension Take 30 mLs by mouth daily as needed for mild constipation.      . pantoprazole (PROTONIX) 40 MG tablet Take 1 tablet (40 mg total) by mouth 2 (two) times daily.      . potassium chloride SA (K-DUR,KLOR-CON) 20 MEQ tablet Take 1 tablet (20 mEq total) by mouth daily.      . predniSONE (DELTASONE) 20 MG tablet Take 40 mg by mouth daily with breakfast.      . promethazine (PHENERGAN) 25 MG tablet Take 25 mg by mouth every 6 (six) hours as needed for nausea or vomiting.      . traMADol (ULTRAM) 50 MG tablet Take 1 tablet (50 mg total) by mouth every 8 (eight) hours as needed for moderate pain.  30 tablet  0  . Vitamin D, Ergocalciferol, (DRISDOL) 50000 UNITS CAPS capsule Take 50,000 Units by mouth every 7 (seven) days. On Sundays       No current facility-administered medications for this visit.    Past Medical History  Diagnosis Date  . HTN (hypertension)   . Diverticulosis   .  Hemorrhoid   . Osteoarthritis   . Constipation, chronic   . H/O: hysterectomy   . Systolic and diastolic CHF, chronic     a. Echo 07/2011: Technically limited; inferior HK, inferolateral and possibly lateral wall HK, EF 40%, moderate LVE, aortic sclerosis without stenosis, mild MR, mild LAE, question RV dysfunction;   b.  Echo (04/2013): EF 50-55%, normal wall motion, moderate MR, mild to moderate LAE, PASP 37  . Female bladder prolapse     with prolapse uterus, cystocele, s/p TVH/BSO 12/2006  . Obesity (BMI 30-39.9)     wt. 94.1 kg 12/2010  . Moderate mitral regurgitation     a. Mod to Sev by echo 01/2009;  b. mild MR by echo 2013  . Atrial fibrillation 10/13/2011    coumadin Rx  . Iron deficiency anemia     Past Surgical History  Procedure Laterality Date  . Wrist surgery    . Vaginal hysterectomy,cystocele and prolapse  repair  NOV. 2008   . Abdominal hysterectomy    . Colonoscopy N/A 07/18/2013    Procedure: COLONOSCOPY;  Surgeon: Wonda Horner, MD;  Location: [facility];  Service: Endoscopy;  Laterality: N/A;    ROS:  Diffuse aches and pains.  Otherwise as stated in the HPI and negative for all other systems.  PHYSICAL EXAM BP 110/60  Pulse 76  Ht 5\' 1"  (1.549 m)  Wt 232 lb (105.235 kg)  BMI 43.86 kg/m2 PHYSICAL EXAM GEN:  No distress NECK:  No jugular venous distention at 90 degrees, waveform within normal limits, carotid upstroke brisk and symmetric, no bruits, no thyromegaly, conjunctiva injected LYMPHATICS:  No cervical adenopathy LUNGS:  Clear to auscultation bilaterally BACK:  No CVA tenderness CHEST:  Unremarkable HEART:  S1 and S2 within normal limits, no S3, no S4, no clicks, no rubs, no murmurs ABD:  Positive bowel sounds normal in frequency in pitch, no bruits, no rebound, no guarding, unable to assess midline mass or bruit with the patient seated,morbidly obese EXT:  2 plus pulses throughout, trace edema, no cyanosis no clubbing   ASSESSMENT AND PLAN  Acute on Chronic Combined Systolic and Diastolic CHF:  She seems to be euvolemic.  She will continue the meds as listed.  I reiterated the need for daily weights. I really think that she should consider staying in a nursing home since this seems to keep her out of the hospital. It's likely that she will weigh herself or necessarily restrict her fluid when she gets home. She will continue with an extra 40 mg of Lasix on days when she is greater than 234 pounds.  Atrial Fibrillation:  She has no overt symptoms with this and this appears to be paroxysmal as recent EKGs show sinus rhythm..  She is not on anticoagulation because of a history of GI bleeding. No change in therapy is indicated.  Hypertension: This is being managed in the context of treating his CHF

## 2013-08-24 NOTE — Patient Instructions (Signed)
Your physician recommends that you schedule a follow-up appointment in: 2 months with an extender  

## 2013-08-27 NOTE — Progress Notes (Signed)
Patient ID: [name], female   DOB: 06/13/1934, 79 y.o.   MRN: [id number]  PCP: Dr Laurann [place]  Primary Cardiologist: Dr Percival Spanish  HPI: 79 year old female, nursing home resident with history of combined systolic and diastolic congestive heart failure, chronic respiratory failure on 2 liters [place]., chronic atrial fibrillation not a candidate for long-term anticoagulation due to recent GI bleeding  (May 2015) , with hospitalization for pneumonia in May 2015, and hypertension. She has been hospitalized  5 times in the last 6 months.   Admitted to [facility] 6/29 through 08/10/13 with respiratory failure and volume overload. Diuresed with IV lasix and transitioned to lasix 60 mg tiwce a day. Discharge weight was 232 pounds. She was discharged to [facility].   She returns for follow up in a wheelchair. Overall feeling better. Wears 2 liters oxygen continuously.  Says she can walk a few steps with walker which is much better than before. Weight at SNF 230-231 pounds. . Able to stand with assistance. Daughter present. Remains SOB with exertion. + Orthopnea. Drinking > 2 liters  Not on low salt diet. Medications provided by SNF. She continues to work with PT.    Labs 08/09/13 K 3.5 Creatinine 1.0  Labs 08/10/13 K 3.2 Creatinine 1.2  08/15/13 K 4.2 Creatinien 1.1  SH: Currently at [facility]   ROS: All systems negative except as listed in HPI, PMH and Problem List.  Past Medical History  Diagnosis Date  . HTN (hypertension)   . Diverticulosis   . Hemorrhoid   . Osteoarthritis   . Constipation, chronic   . H/O: hysterectomy   . Systolic and diastolic CHF, chronic     a. Echo 07/2011: Technically limited; inferior HK, inferolateral and possibly lateral wall HK, EF 40%, moderate LVE, aortic sclerosis without stenosis, mild MR, mild LAE, question RV dysfunction;   b.  Echo (04/2013): EF 50-55%, normal wall motion, moderate MR, mild to moderate LAE, PASP 37  . Female bladder prolapse     with prolapse uterus,  cystocele, s/p TVH/BSO 12/2006  . Obesity (BMI 30-39.9)     wt. 94.1 kg 12/2010  . Moderate mitral regurgitation     a. Mod to Sev by echo 01/2009;  b. mild MR by echo 2013  . Atrial fibrillation 10/13/2011    coumadin Rx  . Iron deficiency anemia     Current Outpatient Prescriptions  Medication Sig Dispense Refill  . albuterol (PROVENTIL) (2.5 MG/3ML) 0.083% nebulizer solution Take 2.5 mg by nebulization every 8 (eight) hours as needed for wheezing or shortness of breath.      . Amino Acids-Protein Hydrolys (FEEDING SUPPLEMENT, PRO-STAT SUGAR FREE 64,) LIQD Take 30 mLs by mouth daily.       . carvedilol (COREG) 3.125 MG tablet Take 3.125 mg by mouth 2 (two) times daily with a meal. Hold if SBP <100      . furosemide (LASIX) 20 MG tablet Take 3 tablets (60 mg total) by mouth 2 (two) times daily.      Marland Kitchen HYDROcodone-acetaminophen (NORCO/VICODIN) 5-325 MG per tablet Take 1 tablet by mouth every 8 (eight) hours as needed (pain).  30 tablet  0  . lisinopril (PRINIVIL,ZESTRIL) 5 MG tablet Take 2.5 mg by mouth daily.       Marland Kitchen lubiprostone (AMITIZA) 24 MCG capsule Take 1 capsule (24 mcg total) by mouth 2 (two) times daily with a meal.  30 capsule  0  . magnesium hydroxide (MILK OF MAGNESIA) 400 MG/5ML suspension Take 30 mLs  by mouth daily as needed for mild constipation.      . pantoprazole (PROTONIX) 40 MG tablet Take 1 tablet (40 mg total) by mouth 2 (two) times daily.      . potassium chloride SA (K-DUR,KLOR-CON) 20 MEQ tablet Take 1 tablet (20 mEq total) by mouth daily.      . predniSONE (DELTASONE) 20 MG tablet Take 40 mg by mouth daily with breakfast.      . promethazine (PHENERGAN) 25 MG tablet Take 25 mg by mouth every 6 (six) hours as needed for nausea or vomiting.      . traMADol (ULTRAM) 50 MG tablet Take 1 tablet (50 mg total) by mouth every 8 (eight) hours as needed for moderate pain.  30 tablet  0  . Vitamin D, Ergocalciferol, (DRISDOL) 50000 UNITS CAPS capsule Take 50,000 Units by mouth  every 7 (seven) days. On Sundays      . ferrous fumarate (FERRETTS) 325 (106 FE) MG TABS Take 1 tablet by mouth 2 (two) times daily. 8am and 4pm       No current facility-administered medications for this encounter.     PHYSICAL EXAM: Filed Vitals:   08/28/13 1132  BP: 117/63  Pulse: 71  Weight: 231 lb (104.781 kg)  SpO2: 98%    General:  Chronically ill appearing. No resp difficulty on 2 liters [place].In a wheelchair.  Daughter present HEENT: normal Neck: supple. JVP 5-6. Carotids 2+ bilaterally; no bruits. No lymphadenopathy or thryomegaly appreciated. Cor: PMI normal. irregular rate & rhythm. No rubs, gallops or murmurs. Lungs: Decreased in the bases on 2 liters [place].  Abdomen: obese,soft, nontender, nondistended. No hepatosplenomegaly. No bruits or masses. Good bowel sounds. Extremities: no cyanosis, clubbing, rash, edema Neuro: alert & orientedx3, cranial nerves grossly intact. Moves all 4 extremities w/o difficulty. Affect pleasant.      ASSESSMENT & PLAN:  1.  Chronic Diastolic Heart Failure Volume status stable. Continue lasix 60 mg twice a day and take and extra 40 mg of lasix if her weight is 234 pounds or greater in 24 hours. Would like to keep weight less than 234 pounds.  Continue low salt diet and limiting fluid intake.   2. HTN  - Stable. Continue current medications.  3. Chronic A fib - On carvedilol 3.125 mg twice a day. Not on anticoagulants due to GI bleeds. Most recent GI bleed 06/2013 .  4. Immobility- Continue PT at SNF  5. H/O GI bleeds- on Ferrous Sulfate and Protonix. Per primary doctor at [facility]  6. Chronic Respiratory Failure on 2 liters [place] oxygen.   Follow up in 3 months.     Dama Hedgepeth NP-C  11:42 AM

## 2013-08-28 ENCOUNTER — Ambulatory Visit (HOSPITAL_COMMUNITY)
Admission: RE | Admit: 2013-08-28 | Discharge: 2013-08-28 | Disposition: A | Discharge status: Home / Self Care | Payer: Medicare Other | Source: Ambulatory Visit | Attending: Cardiology | Admitting: Cardiology

## 2013-08-28 VITALS — BP 117/63 | HR 71 | Wt 231.0 lb

## 2013-08-28 DIAGNOSIS — I509 Heart failure, unspecified: Secondary | ICD-10-CM

## 2013-08-28 DIAGNOSIS — I4891 Unspecified atrial fibrillation: Secondary | ICD-10-CM | POA: Insufficient documentation

## 2013-08-28 DIAGNOSIS — I10 Essential (primary) hypertension: Secondary | ICD-10-CM | POA: Diagnosis not present

## 2013-08-28 DIAGNOSIS — I5042 Chronic combined systolic (congestive) and diastolic (congestive) heart failure: Secondary | ICD-10-CM | POA: Diagnosis not present

## 2013-08-28 DIAGNOSIS — D509 Iron deficiency anemia, unspecified: Secondary | ICD-10-CM | POA: Diagnosis not present

## 2013-08-28 DIAGNOSIS — E669 Obesity, unspecified: Secondary | ICD-10-CM | POA: Diagnosis not present

## 2013-08-28 DIAGNOSIS — Z9981 Dependence on supplemental oxygen: Secondary | ICD-10-CM | POA: Diagnosis not present

## 2013-08-28 DIAGNOSIS — J9610 Chronic respiratory failure, unspecified whether with hypoxia or hypercapnia: Secondary | ICD-10-CM

## 2013-08-29 ENCOUNTER — Other Ambulatory Visit: Payer: Self-pay | Admitting: *Deleted

## 2013-08-29 MED ORDER — HYDROCODONE-ACETAMINOPHEN 5-325 MG PO TABS: 1.0000 | ORAL_TABLET | Freq: Three times a day (TID) | ORAL | Status: DC | PRN

## 2013-08-29 NOTE — Telephone Encounter (Signed)
[facility] 

## 2013-09-14 ENCOUNTER — Encounter: Payer: Self-pay | Admitting: Nurse Practitioner

## 2013-09-14 ENCOUNTER — Non-Acute Institutional Stay (SKILLED_NURSING_FACILITY): Payer: Medicare Other | Admitting: Nurse Practitioner

## 2013-09-14 DIAGNOSIS — D509 Iron deficiency anemia, unspecified: Secondary | ICD-10-CM

## 2013-09-14 DIAGNOSIS — G8929 Other chronic pain: Secondary | ICD-10-CM

## 2013-09-14 DIAGNOSIS — I5042 Chronic combined systolic (congestive) and diastolic (congestive) heart failure: Secondary | ICD-10-CM

## 2013-09-14 DIAGNOSIS — K5909 Other constipation: Secondary | ICD-10-CM

## 2013-09-14 DIAGNOSIS — I509 Heart failure, unspecified: Secondary | ICD-10-CM

## 2013-09-14 DIAGNOSIS — J9610 Chronic respiratory failure, unspecified whether with hypoxia or hypercapnia: Secondary | ICD-10-CM

## 2013-09-14 NOTE — Progress Notes (Signed)
Patient ID: [name], female   DOB: [birth year], 79 y.o.   MRN: [id number]    Nursing Home Location:  [facility]   Place of Service: SNF (68)  PCP: Osborne Casco, MD  Allergies  Allergen Reactions  . Levaquin [Levofloxacin] Other (See Comments)    Per MAR  . Penicillins Other (See Comments)    Per MAR  . Tetanus-Diphtheria Toxoids Td Other (See Comments)    Per MAR  . Latex Hives and Rash    Chief Complaint  Patient presents with  . Medical Management of Chronic Issues    Routine Visit     HPI:  79 year old female pmh of CHF, OA, CKD, Chronic respiratory failure with hypoxia, and HTN being seen today for medical management of chronic conditions. Pt has had multiple hospitalization due to CHF and follows with cardiology. Pt denies any worsening of shortness of breath or chest pains. Weight have been stable. Pt reports she hurts all over but can not go into any other detail other than that. Pt unable to describe pain, give frequency, intensity or duration. Pt reports constipation but staff reports she has been having BMs Cont to work with physical therapy  Review of Systems:  Review of Systems  Constitutional: Negative for fever, chills and malaise/fatigue.  Respiratory: Positive for shortness of breath (chronic- without changes). Negative for cough.   Cardiovascular: Negative for chest pain and leg swelling.  Gastrointestinal: Positive for constipation.  Genitourinary: Negative for dysuria, urgency and frequency.  Musculoskeletal: Positive for myalgias. Negative for falls.  Neurological: Negative for dizziness and weakness.  Psychiatric/Behavioral: Positive for memory loss.     Past Medical History  Diagnosis Date  . HTN (hypertension)   . Diverticulosis   . Hemorrhoid   . Osteoarthritis   . Constipation, chronic   . H/O: hysterectomy   . Systolic and diastolic CHF, chronic     a. Echo 07/2011: Technically limited; inferior HK,  inferolateral and possibly lateral wall HK, EF 40%, moderate LVE, aortic sclerosis without stenosis, mild MR, mild LAE, question RV dysfunction;   b.  Echo (04/2013): EF 50-55%, normal wall motion, moderate MR, mild to moderate LAE, PASP 37  . Female bladder prolapse     with prolapse uterus, cystocele, s/p TVH/BSO 12/2006  . Obesity (BMI 30-39.9)     wt. 94.1 kg 12/2010  . Moderate mitral regurgitation     a. Mod to Sev by echo 01/2009;  b. mild MR by echo 2013  . Atrial fibrillation 10/13/2011    coumadin Rx  . Iron deficiency anemia    Past Surgical History  Procedure Laterality Date  . Wrist surgery    . Vaginal hysterectomy,cystocele and prolapse  repair  NOV. 2008  . Abdominal hysterectomy    . Colonoscopy N/A 07/18/2013    Procedure: COLONOSCOPY;  Surgeon: Wonda Horner, MD;  Location: [facility];  Service: Endoscopy;  Laterality: N/A;   Social History:   reports that she has never smoked. Her smokeless tobacco use includes Snuff. She reports that she does not drink alcohol or use illicit drugs.  Family History  Problem Relation Age of Onset  . Diabetes Brother   . Hypertension Mother     deceased @ 5  . Leukemia Brother   . Cancer Brother   . Stroke Mother   . Diabetes Father     deceased in his 17's    Medications: Patient's Medications  New Prescriptions   No medications on  file  Previous Medications   ALBUTEROL (PROVENTIL) (2.5 MG/3ML) 0.083% NEBULIZER SOLUTION    Take 2.5 mg by nebulization every 8 (eight) hours as needed for wheezing or shortness of breath.   AMINO ACIDS-PROTEIN HYDROLYS (FEEDING SUPPLEMENT, PRO-STAT SUGAR FREE 64,) LIQD    Take 30 mLs by mouth daily.    CARVEDILOL (COREG) 3.125 MG TABLET    Take 3.125 mg by mouth 2 (two) times daily with a meal. Hold if SBP <100   FERROUS FUMARATE (FERRETTS) 325 (106 FE) MG TABS    Take 1 tablet by mouth 2 (two) times daily. 8am and 4pm   HYDROCODONE-ACETAMINOPHEN (NORCO/VICODIN) 5-325 MG PER TABLET    Take 1  tablet by mouth every 8 (eight) hours as needed (pain).   LISINOPRIL (PRINIVIL,ZESTRIL) 5 MG TABLET    1/2 by mouth daily for hypertension   LUBIPROSTONE (AMITIZA) 24 MCG CAPSULE    Take 1 capsule (24 mcg total) by mouth 2 (two) times daily with a meal.   MAGNESIUM HYDROXIDE (MILK OF MAGNESIA) 400 MG/5ML SUSPENSION    Take 30 mLs by mouth daily as needed for mild constipation.   PANTOPRAZOLE (PROTONIX) 40 MG TABLET    Take 1 tablet (40 mg total) by mouth 2 (two) times daily.   PREDNISONE (DELTASONE) 20 MG TABLET    2 by mouth at breakfast for arthritis   PROMETHAZINE (PHENERGAN) 25 MG TABLET    Take 25 mg by mouth every 6 (six) hours as needed for nausea or vomiting.   TRAMADOL (ULTRAM) 50 MG TABLET    Take 1 tablet (50 mg total) by mouth every 8 (eight) hours as needed for moderate pain.   VITAMIN D, ERGOCALCIFEROL, (DRISDOL) 50000 UNITS CAPS CAPSULE    Take 50,000 Units by mouth every 7 (seven) days. On Sundays  Modified Medications   Modified Medication Previous Medication   FUROSEMIDE (LASIX) 20 MG TABLET furosemide (LASIX) 20 MG tablet      Take 60 mg by mouth 2 (two) times daily. For CHF. Give extra 40 mg lasix and potassium 20 meq by mouth if resident weight increase by 3 lbs in 24 hours    Take 3 tablets (60 mg total) by mouth 2 (two) times daily.   POTASSIUM CHLORIDE SA (K-DUR,KLOR-CON) 20 MEQ TABLET potassium chloride SA (K-DUR,KLOR-CON) 20 MEQ tablet      Take 20 mEq by mouth daily. Give extra 40 mg lasix and potassium 20 meq by mouth if resident weight increase by 3 lbs in 24 hours    Take 1 tablet (20 mEq total) by mouth daily.  Discontinued Medications   No medications on file     Physical Exam:  Filed Vitals:   09/14/13 0944  BP: 123/78  Pulse: 54  Resp: 18    Physical Exam  Constitutional: She is well-developed, well-nourished, and in no distress.  HENT:  Mouth/Throat: Oropharynx is clear and moist. No oropharyngeal exudate.  Eyes: Conjunctivae and EOM are normal.  Pupils are equal, round, and reactive to light.  Neck: Normal range of motion. Neck supple.  Cardiovascular: Normal rate and regular rhythm.   Murmur heard. Pulmonary/Chest: Effort normal and breath sounds normal.  Abdominal: Soft. Bowel sounds are normal.  Musculoskeletal: She exhibits no edema and no tenderness.  Neurological: She is alert.  Skin: Skin is warm and dry.  Psychiatric: She has a flat affect.     Labs reviewed: Basic Metabolic Panel:  Recent Labs  06/03/13 1825  06/05/13 1200 06/06/13 0233  08/09/13  4665 08/10/13 0417 08/15/13 1044  NA  --   < >  --  129*  < > 133* 131* 135*  K  --   < > 4.1 3.8  < > 3.5* 3.2* 4.2  CL  --   < >  --  96  < > 88* 82* 93*  CO2  --   < >  --  22  < > 32 35* 26  GLUCOSE  --   < >  --  90  < > 87 87 103*  BUN  --   < >  --  29*  < > 28* 34* 52*  CREATININE  --   < >  --  1.45*  < > 1.04 1.21* 1.11*  CALCIUM  --   < >  --  7.4*  < > 8.5 8.9 8.8  MG 2.6*  --  2.0 1.9  --   --   --   --   PHOS 3.2  --   --   --   --   --   --   --   < > = values in this interval not displayed. Liver Function Tests:  Recent Labs  07/13/13 1325 07/14/13 0541 08/05/13 0800  AST 11 8 33  ALT 10 9 86*  ALKPHOS 88 79 120*  BILITOT 0.2* 0.3 0.3  PROT 6.2 5.0* 6.0  ALBUMIN 3.0* 2.4* 2.7*    Recent Labs  06/03/13 1135 07/13/13 1325  LIPASE 9* 13    Recent Labs  06/04/13 1120  AMMONIA 65*   CBC:  Recent Labs  07/13/13 1325  08/04/13 0322  08/04/13 0811 08/05/13 0800 08/09/13 0357  WBC 6.2  < > 19.9*  --  13.2* 10.0 10.7*  NEUTROABS 4.1  --  18.1*  --   --  8.2*  --   HGB 8.7*  < > 10.1*  < > 10.0* 10.2* 10.4*  HCT 27.8*  < > 32.5*  < > 31.6* 32.5* 33.4*  MCV 98.6  < > 97.6  --  98.1 97.0 96.0  PLT 259  < > 308  --  277 280 280  < > = values in this interval not displayed. Cardiac Enzymes:  Recent Labs  06/03/13 2335 06/04/13 0333 06/19/13 2050  CKTOTAL  --   --  61  TROPONINI <0.30 <0.30 <0.30   BNP: No components  found with this basename: POCBNP,  CBG:  Recent Labs  06/26/13 0731 06/26/13 1100 07/13/13 2209  GLUCAP 85 111* 77   TSH:  Recent Labs  03/14/13 1208 06/03/13 1825 08/04/13 0811  TSH 1.48 2.070 2.500   A1C: Lab Results  Component Value Date   HGBA1C 5.6 06/03/2013     Assessment/Plan 1. Systolic and diastolic CHF, chronic -conts lasix, coreg, lisinopril -will follow up bmp 2. Chronic respiratory failure, unspecified whether with hypoxia or hypercapnia -pt remains stable at this time, without changes to respiratory status  3. CONSTIPATION, CHRONIC -will cont amitiza  4. Chronic pain -generalized without able to go into detail, will cont current PRNs at this time  5. Iron deficiency anemia -conts on iron, will follow up cbc

## 2013-10-18 ENCOUNTER — Encounter: Payer: Self-pay | Admitting: Internal Medicine

## 2013-10-18 ENCOUNTER — Non-Acute Institutional Stay (SKILLED_NURSING_FACILITY): Payer: Medicare Other | Admitting: Internal Medicine

## 2013-10-18 DIAGNOSIS — I4819 Other persistent atrial fibrillation: Secondary | ICD-10-CM

## 2013-10-18 DIAGNOSIS — I10 Essential (primary) hypertension: Secondary | ICD-10-CM

## 2013-10-18 DIAGNOSIS — N182 Chronic kidney disease, stage 2 (mild): Secondary | ICD-10-CM

## 2013-10-18 DIAGNOSIS — I5042 Chronic combined systolic (congestive) and diastolic (congestive) heart failure: Secondary | ICD-10-CM

## 2013-10-18 DIAGNOSIS — J9611 Chronic respiratory failure with hypoxia: Secondary | ICD-10-CM

## 2013-10-18 DIAGNOSIS — R0902 Hypoxemia: Secondary | ICD-10-CM

## 2013-10-18 DIAGNOSIS — N183 Chronic kidney disease, stage 3 unspecified: Secondary | ICD-10-CM | POA: Insufficient documentation

## 2013-10-18 DIAGNOSIS — I4891 Unspecified atrial fibrillation: Secondary | ICD-10-CM

## 2013-10-18 DIAGNOSIS — R21 Rash and other nonspecific skin eruption: Secondary | ICD-10-CM

## 2013-10-18 DIAGNOSIS — I509 Heart failure, unspecified: Secondary | ICD-10-CM

## 2013-10-18 DIAGNOSIS — J9610 Chronic respiratory failure, unspecified whether with hypoxia or hypercapnia: Secondary | ICD-10-CM

## 2013-10-18 NOTE — Progress Notes (Signed)
MRN: [id number] Name: [name]  Sex: female Age: 79 y.o. DOB: [birth year]  [place] #: [name]  Facility/Room  [address]B Level Of Care: SNF Provider: Inocencio Homes D Emergency Contacts: Extended Emergency Contact Information Primary Emergency Contact: Sotelo,Reggie Address: [place], [place] [address] [place] Phone: [phone] Work Phone: [phone] Relation: Son Secondary Emergency Contact: Ruiter,[name]          [place], [place] Phone: [phone] Relation: Daughter  Code Status: FULL  Allergies: Levaquin; Penicillins; Tetanus-diphtheria toxoids td; and Latex  Chief Complaint  Patient presents with  . Medical Management of Chronic Issues    HPI: Patient is 79 y.o. female who is being seen for routine issues.  Past Medical History  Diagnosis Date  . HTN (hypertension)   . Diverticulosis   . Hemorrhoid   . Osteoarthritis   . Constipation, chronic   . H/O: hysterectomy   . Systolic and diastolic CHF, chronic     a. Echo 07/2011: Technically limited; inferior HK, inferolateral and possibly lateral wall HK, EF 40%, moderate LVE, aortic sclerosis without stenosis, mild MR, mild LAE, question RV dysfunction;   b.  Echo (04/2013): EF 50-55%, normal wall motion, moderate MR, mild to moderate LAE, PASP 37  . Female bladder prolapse     with prolapse uterus, cystocele, s/p TVH/BSO 12/2006  . Obesity (BMI 30-39.9)     wt. 94.1 kg 12/2010  . Moderate mitral regurgitation     a. Mod to Sev by echo 01/2009;  b. mild MR by echo 2013  . Atrial fibrillation 10/13/2011    coumadin Rx  . Iron deficiency anemia     Past Surgical History  Procedure Laterality Date  . Wrist surgery    . Vaginal hysterectomy,cystocele and prolapse  repair  NOV. 2008  . Abdominal hysterectomy    . Colonoscopy N/A 07/18/2013    Procedure: COLONOSCOPY;  Surgeon: Wonda Horner, MD;  Location: [facility];  Service: Endoscopy;   Laterality: N/A;      Medication List       This list is accurate as of: 10/18/13  8:00 PM.  Always use your most recent med list.               albuterol (2.5 MG/3ML) 0.083% nebulizer solution  Commonly known as:  PROVENTIL  Take 2.5 mg by nebulization every 8 (eight) hours as needed for wheezing or shortness of breath.     carvedilol 3.125 MG tablet  Commonly known as:  COREG  Take 3.125 mg by mouth 2 (two) times daily with a meal. Hold if SBP <100     feeding supplement (PRO-STAT SUGAR FREE 64) Liqd  Take 30 mLs by mouth daily.     FERRETTS 325 (106 FE) MG Tabs tablet  Generic drug:  ferrous fumarate  Take 1 tablet by mouth 2 (two) times daily. 8am and 4pm     furosemide 20 MG tablet  Commonly known as:  LASIX  Take 60 mg by mouth 2 (two) times daily. For CHF. Give extra 40 mg lasix and potassium 20 meq by mouth if resident weight increase by 3 lbs in 24 hours     HYDROcodone-acetaminophen 5-325 MG per tablet  Commonly known as:  NORCO/VICODIN  Take 1 tablet by mouth every 8 (eight) hours as needed (pain).     lisinopril 5 MG tablet  Commonly known as:  PRINIVIL,ZESTRIL  1/2 by mouth daily for hypertension     lubiprostone 24 MCG capsule  Commonly known as:  AMITIZA  Take 1 capsule (24 mcg total) by mouth 2 (two) times daily with a meal.     magnesium hydroxide 400 MG/5ML suspension  Commonly known as:  MILK OF MAGNESIA  Take 30 mLs by mouth daily as needed for mild constipation.     pantoprazole 40 MG tablet  Commonly known as:  PROTONIX  Take 1 tablet (40 mg total) by mouth 2 (two) times daily.     potassium chloride SA 20 MEQ tablet  Commonly known as:  K-DUR,KLOR-CON  Take 20 mEq by mouth daily. Give extra 40 mg lasix and potassium 20 meq by mouth if resident weight increase by 3 lbs in 24 hours     predniSONE 20 MG tablet  Commonly known as:  DELTASONE  2 by mouth at breakfast for arthritis     promethazine 25 MG tablet  Commonly known as:   PHENERGAN  Take 25 mg by mouth every 6 (six) hours as needed for nausea or vomiting.     traMADol 50 MG tablet  Commonly known as:  ULTRAM  Take 1 tablet (50 mg total) by mouth every 8 (eight) hours as needed for moderate pain.     Vitamin D (Ergocalciferol) 50000 UNITS Caps capsule  Commonly known as:  DRISDOL  Take 50,000 Units by mouth every 7 (seven) days. On Sundays        No orders of the defined types were placed in this encounter.     There is no immunization history on file for this patient.  History  Substance Use Topics  . Smoking status: Never Smoker   . Smokeless tobacco: Current User    Types: Snuff     Comment: Uses Snuff regularly.  Dips several x/day.  A "large can" can last her up to 3 mos.  . Alcohol Use: No    Review of Systems  DATA OBTAINED: from patient, nurse, medical record GENERAL: Feels well no fevers, fatigue, appetite changes SKIN: rash on back;denies itching, says it hurts HEENT: No complaint RESPIRATORY: No cough, wheezing, SOB CARDIAC: No chest pain, palpitations, lower extremity edema  GI: No abdominal pain, No N/V/D or constipation, No heartburn or reflux  GU: No dysuria, frequency or urgency, or incontinence  MUSCULOSKELETAL: No unrelieved bone/joint pain NEUROLOGIC: No headache, dizziness or focal weakness PSYCHIATRIC: No overt anxiety or sadness. Sleeps well.   Filed Vitals:   10/18/13 1447  BP: 110/72  Pulse: 80  Temp: 97.7 F (36.5 C)  Resp: 22    Physical Exam  GENERAL APPEARANCE: Alert, conversant. Appropriately groomed. No acute distress  SKIN: B upper back L>Rblotchy redness but no swelling or heat; edge of large scale HEENT: Unremarkable RESPIRATORY: Breathing is even, unlabored. Lung sounds are clear   CARDIOVASCULAR: Heart RRR no murmurs, rubs or gallops. 1+ peripheral edema  GASTROINTESTINAL: Abdomen is soft, non-tender, not distended w/ normal bowel sounds.  GENITOURINARY: Bladder non tender, not distended   MUSCULOSKELETAL: No abnormal joints or musculature NEUROLOGIC: Cranial nerves 2-12 grossly intact. Moves all extremities PSYCHIATRIC: Mood and affect appropriate to situation, no behavioral issues  Patient Active Problem List   Diagnosis Date Noted  . Chronic kidney disease (CKD), stage II (mild) 10/18/2013  . Chronic respiratory failure 08/28/2013  . Chronic diastolic heart failure 76/81/1572  . Immobility 08/15/2013  . Acute respiratory failure 08/13/2013  . GERD (gastroesophageal reflux disease) 08/13/2013  . CHF (  congestive heart failure) 08/04/2013  . Congestive heart failure, NYHA class 4 08/04/2013  . GI bleed 07/13/2013  . History of lower GI bleeding 07/11/2013  . AKI (acute kidney injury) 06/29/2013  . Acute lower GI bleeding 06/23/2013  . PNA (pneumonia) 06/19/2013  . Rash and nonspecific skin eruption 06/19/2013  . Chronic respiratory failure with hypoxia 06/18/2013  . Acute encephalopathy 06/07/2013  . Diastolic CHF 08/67/6195  . Pulmonary hypertension 06/05/2013  . Weakness 06/03/2013  . Encounter for therapeutic drug monitoring 04/10/2013  . Long term current use of anticoagulant 10/26/2011  . Hypokalemia 10/15/2011  . Abdominal pain 10/15/2011  . Iron deficiency anemia 10/15/2011  . Bladder prolapse, female, acquired 10/15/2011  . Atrial fibrillation 10/15/2011  . Acute on chronic combined systolic and diastolic congestive heart failure 07/22/2011  . Facial edema 12/30/2010  . Leukopenia 12/30/2010  . Neutropenia 12/30/2010  . Abdominal pain, unspecified site 12/27/2010  . Chronic pain 12/25/2010  . Hematochezia 12/25/2010  . Dehydration 12/25/2010  . Thrombocytosis 12/25/2010  . Hyponatremia 12/25/2010  . Systolic and diastolic CHF, chronic   . Female bladder prolapse   . Mitral regurgitation 06/04/2010  . HYPOPOTASSEMIA 09/24/2009  . COMBINED HEART FAILURE, CHRONIC 09/09/2009  . CARDIOMYOPATHY 03/04/2009  . ANASARCA 03/04/2009  . HYPERTENSION  02/20/2009  . CONSTIPATION, CHRONIC 02/20/2009  . DIVERTICULOSIS 02/20/2009    CBC    Component Value Date/Time   WBC 10.7* 08/09/2013 0357   RBC 3.48* 08/09/2013 0357   RBC 1.95* 06/23/2013 1118   HGB 10.4* 08/09/2013 0357   HCT 33.4* 08/09/2013 0357   PLT 280 08/09/2013 0357   MCV 96.0 08/09/2013 0357   LYMPHSABS 1.3 08/05/2013 0800   MONOABS 0.4 08/05/2013 0800   EOSABS 0.1 08/05/2013 0800   BASOSABS 0.0 08/05/2013 0800    CMP     Component Value Date/Time   NA 135* 08/15/2013 1044   K 4.2 08/15/2013 1044   CL 93* 08/15/2013 1044   CO2 26 08/15/2013 1044   GLUCOSE 103* 08/15/2013 1044   BUN 52* 08/15/2013 1044   CREATININE 1.11* 08/15/2013 1044   CALCIUM 8.8 08/15/2013 1044   PROT 6.0 08/05/2013 0800   ALBUMIN 2.7* 08/05/2013 0800   AST 33 08/05/2013 0800   ALT 86* 08/05/2013 0800   ALKPHOS 120* 08/05/2013 0800   BILITOT 0.3 08/05/2013 0800   GFRNONAA 47* 08/15/2013 1044   GFRAA 54* 08/15/2013 1044    Assessment and Plan   Systolic and diastolic CHF, chronic BBlocker, lasix and on ACE;TED hose for pedal edema  Atrial fibrillation Low dose Bblocker;no anticoagulation 2/2 h/o GI bleed  HYPERTENSION Good control with lisinopril 5mg , Lasix and coreg, both used for CHF  Chronic respiratory failure with hypoxia Pt on  No O2 today without distress  Rash and nonspecific skin eruption  Looks fungal with redness and peeling- terbinafine topical BID until resolved or 4 weeks- need to keep in mind prior rash if treatment not working  Chronic kidney disease (CKD), stage II (mild) BUN 16/Cr 0.83, GFR 79 in 08/2013    Hennie Duos, MD

## 2013-10-18 NOTE — Assessment & Plan Note (Addendum)
BBlocker, lasix and on ACE;TED hose for pedal edema

## 2013-10-18 NOTE — Assessment & Plan Note (Signed)
Low dose Bblocker;no anticoagulation 2/2 h/o GI bleed

## 2013-10-18 NOTE — Assessment & Plan Note (Signed)
Looks fungal with redness and peeling- terbinafine topical BID until resolved or 4 weeks- need to keep in mind prior rash if treatment not working

## 2013-10-18 NOTE — Assessment & Plan Note (Signed)
Pt on  No O2 today without distress

## 2013-10-18 NOTE — Assessment & Plan Note (Signed)
BUN 16/Cr 0.83, GFR 79 in 08/2013

## 2013-10-18 NOTE — Assessment & Plan Note (Signed)
Good control with lisinopril 5mg , Lasix and coreg, both used for CHF

## 2013-11-15 ENCOUNTER — Other Ambulatory Visit: Payer: Self-pay | Admitting: *Deleted

## 2013-11-15 MED ORDER — HYDROCODONE-ACETAMINOPHEN 5-325 MG PO TABS: 1.0000 | ORAL_TABLET | Freq: Three times a day (TID) | ORAL | Status: DC | PRN

## 2013-11-15 NOTE — Telephone Encounter (Signed)
[facility] 

## 2013-11-15 NOTE — Telephone Encounter (Signed)
[facility] 

## 2013-11-16 ENCOUNTER — Non-Acute Institutional Stay (SKILLED_NURSING_FACILITY): Payer: Medicare Other | Admitting: Nurse Practitioner

## 2013-11-16 DIAGNOSIS — I481 Persistent atrial fibrillation: Secondary | ICD-10-CM

## 2013-11-16 DIAGNOSIS — K5909 Other constipation: Secondary | ICD-10-CM

## 2013-11-16 DIAGNOSIS — I5032 Chronic diastolic (congestive) heart failure: Secondary | ICD-10-CM

## 2013-11-16 DIAGNOSIS — I4819 Other persistent atrial fibrillation: Secondary | ICD-10-CM

## 2013-11-16 DIAGNOSIS — E559 Vitamin D deficiency, unspecified: Secondary | ICD-10-CM

## 2013-11-16 DIAGNOSIS — G8929 Other chronic pain: Secondary | ICD-10-CM

## 2013-11-16 DIAGNOSIS — I10 Essential (primary) hypertension: Secondary | ICD-10-CM

## 2013-11-16 NOTE — Progress Notes (Signed)
Patient ID: [name], female   DOB: 12-Oct-1934, 79 y.o.   MRN: [id number]    Nursing Home Location:  [facility]   Place of Service: SNF (85)  PCP: Osborne Casco, MD  Allergies  Allergen Reactions  . Levaquin [Levofloxacin] Other (See Comments)    Per MAR  . Penicillins Other (See Comments)    Per MAR  . Tetanus-Diphtheria Toxoids Td Other (See Comments)    Per MAR  . Latex Hives and Rash    Chief Complaint  Patient presents with  . Medical Management of Chronic Issues    HPI:  Patient is a 79 y.o. female seen today at [facility] for routine follow up. ot with pmh of CHF, OA, CKD, Chronic respiratory failure with hypoxia, and HTN. Pt has had multiple hospitalization due to CHF and follows with cardiology. Reports she does not like fluid restriction and wants to have more fluids available to her. Staff weighing pt daily but there appears to be varying weights. Management aware and trying to standardize this.   Review of Systems:  Review of Systems  Constitutional: Negative for activity change, appetite change, fatigue and unexpected weight change.  HENT: Negative for congestion and hearing loss.   Eyes: Negative.   Respiratory: Negative for cough and shortness of breath.        Chronic shortness of breath, this has remained unchanged  Cardiovascular: Negative for chest pain, palpitations and leg swelling.  Gastrointestinal: Positive for constipation. Negative for abdominal pain and diarrhea.  Genitourinary: Negative for dysuria and difficulty urinating.  Musculoskeletal: Positive for arthralgias. Negative for myalgias.  Skin: Negative for color change and wound.  Neurological: Negative for dizziness and weakness.  Psychiatric/Behavioral: Negative for behavioral problems, confusion and agitation.    Past Medical History  Diagnosis Date  . HTN (hypertension)   . Diverticulosis   . Hemorrhoid   . Osteoarthritis   .  Constipation, chronic   . H/O: hysterectomy   . Systolic and diastolic CHF, chronic     a. Echo 07/2011: Technically limited; inferior HK, inferolateral and possibly lateral wall HK, EF 40%, moderate LVE, aortic sclerosis without stenosis, mild MR, mild LAE, question RV dysfunction;   b.  Echo (04/2013): EF 50-55%, normal wall motion, moderate MR, mild to moderate LAE, PASP 37  . Female bladder prolapse     with prolapse uterus, cystocele, s/p TVH/BSO 12/2006  . Obesity (BMI 30-39.9)     wt. 94.1 kg 12/2010  . Moderate mitral regurgitation     a. Mod to Sev by echo 01/2009;  b. mild MR by echo 2013  . Atrial fibrillation 10/13/2011    coumadin Rx  . Iron deficiency anemia    Past Surgical History  Procedure Laterality Date  . Wrist surgery    . Vaginal hysterectomy,cystocele and prolapse  repair  NOV. 2008  . Abdominal hysterectomy    . Colonoscopy N/A 07/18/2013    Procedure: COLONOSCOPY;  Surgeon: Wonda Horner, MD;  Location: [facility];  Service: Endoscopy;  Laterality: N/A;   Social History:   reports that she has never smoked. Her smokeless tobacco use includes Snuff. She reports that she does not drink alcohol or use illicit drugs.  Family History  Problem Relation Age of Onset  . Diabetes Brother   . Hypertension Mother     deceased @ 93  . Leukemia Brother   . Cancer Brother   . Stroke Mother   . Diabetes Father  deceased in his 28's    Medications: Patient's Medications  New Prescriptions   METOLAZONE (ZAROXOLYN) 2.5 MG TABLET    Take 1 tablet (2.5 mg total) by mouth every Wednesday.  Previous Medications   ALBUTEROL (PROVENTIL) (2.5 MG/3ML) 0.083% NEBULIZER SOLUTION    Take 2.5 mg by nebulization every 8 (eight) hours as needed for wheezing or shortness of breath.   ALUM & MAG HYDROXIDE-SIMETH (MAGIC MOUTHWASH) SOLN    Take 10 mLs by mouth 2 (two) times daily.   AMINO ACIDS-PROTEIN HYDROLYS (FEEDING SUPPLEMENT, PRO-STAT SUGAR FREE 64,) LIQD    Take 30 mLs by  mouth daily.    BISACODYL (DULCOLAX) 10 MG SUPPOSITORY    Place 10 mg rectally daily as needed for moderate constipation (if constipation not relieved by MOM).   CARVEDILOL (COREG) 3.125 MG TABLET    Take 3.125 mg by mouth 2 (two) times daily with a meal. Hold if SBP <100   FERROUS FUMARATE (FERRETTS) 325 (106 FE) MG TABS    Take 1 tablet by mouth 2 (two) times daily. 8am and 4pm   HYDROCODONE-ACETAMINOPHEN (NORCO/VICODIN) 5-325 MG PER TABLET    Take 1 tablet by mouth every 8 (eight) hours as needed (pain).   LISINOPRIL (PRINIVIL,ZESTRIL) 5 MG TABLET    1/2 by mouth daily for hypertension   LUBIPROSTONE (AMITIZA) 24 MCG CAPSULE    Take 1 capsule (24 mcg total) by mouth 2 (two) times daily with a meal.   MAGNESIUM HYDROXIDE (MILK OF MAGNESIA) 400 MG/5ML SUSPENSION    Take 30 mLs by mouth daily as needed for mild constipation.   PANTOPRAZOLE (PROTONIX) 40 MG TABLET    Take 1 tablet (40 mg total) by mouth 2 (two) times daily.   POLYETHYLENE GLYCOL (MIRALAX / GLYCOLAX) PACKET    Take 17 g by mouth daily.   POTASSIUM CHLORIDE SA (K-DUR,KLOR-CON) 20 MEQ TABLET    Take 20 mEq by mouth daily. Give extra 40 mg lasix and potassium 20 meq by mouth if resident weight increase by 3 lbs in 24 hours   PREDNISONE (DELTASONE) 20 MG TABLET    2 by mouth at breakfast for arthritis   PROMETHAZINE (PHENERGAN) 25 MG TABLET    Take 25 mg by mouth every 6 (six) hours as needed for nausea or vomiting.   SODIUM PHOSPHATES (RA SALINE ENEMA RE)    Place 1 Package rectally daily as needed (if constipation not relieved by dulcolax suppository after MOM).   TORSEMIDE (DEMADEX) 20 MG TABLET    Take 40 mg by mouth daily. Take 40mg  twice daily if weight above 230 lbs   VITAMIN D, ERGOCALCIFEROL, (DRISDOL) 50000 UNITS CAPS CAPSULE    Take 50,000 Units by mouth every 7 (seven) days. On Sundays  Modified Medications   Modified Medication Previous Medication   TRAMADOL (ULTRAM) 50 MG TABLET traMADol (ULTRAM) 50 MG tablet      Take one  tablet by mouth every 8 hours as needed for moderate pain    Take 1 tablet (50 mg total) by mouth every 8 (eight) hours as needed for moderate pain.  Discontinued Medications   FUROSEMIDE (LASIX) 20 MG TABLET    Take 60 mg by mouth 2 (two) times daily. For CHF. Give extra 40 mg lasix and potassium 20 meq by mouth if resident weight increase by 3 lbs in 24 hours     Physical Exam: Filed Vitals:   11/16/13 1639  BP: 130/64  Pulse: 66  Temp: 98 F (36.7 C)  Resp: 20  Weight: 249 lb (112.946 kg)    Physical Exam  Constitutional: She appears well-developed and well-nourished. No distress.  HENT:  Head: Normocephalic and atraumatic.  Mouth/Throat: Oropharynx is clear and moist. No oropharyngeal exudate.  Eyes: Conjunctivae are normal. Pupils are equal, round, and reactive to light.  Neck: Normal range of motion. Neck supple.  Cardiovascular: Normal rate and regular rhythm.   Murmur heard. Pulmonary/Chest: Effort normal and breath sounds normal.  Abdominal: Soft. Bowel sounds are normal.  Musculoskeletal: She exhibits edema (trace to bilateral LE). She exhibits no tenderness.  Neurological: She is alert.  Skin: Skin is warm and dry. She is not diaphoretic.  Psychiatric: She has a normal mood and affect.    Labs reviewed: Basic Metabolic Panel:  Recent Labs  06/03/13 1825  06/05/13 1200 06/06/13 0233  08/10/13 0417 08/15/13 1044 11/28/13 1110  NA  --   < >  --  129*  < > 131* 135* 142  K  --   < > 4.1 3.8  < > 3.2* 4.2 3.8  CL  --   < >  --  96  < > 82* 93* 98  CO2  --   < >  --  22  < > 35* 26 28  GLUCOSE  --   < >  --  90  < > 87 103* 137*  BUN  --   < >  --  29*  < > 34* 52* 26*  CREATININE  --   < >  --  1.45*  < > 1.21* 1.11* 1.06  CALCIUM  --   < >  --  7.4*  < > 8.9 8.8 8.9  MG 2.6*  --  2.0 1.9  --   --   --   --   PHOS 3.2  --   --   --   --   --   --   --   < > = values in this interval not displayed. Liver Function Tests:  Recent Labs  07/13/13 1325  07/14/13 0541 08/05/13 0800  AST 11 8 33  ALT 10 9 86*  ALKPHOS 88 79 120*  BILITOT 0.2* 0.3 0.3  PROT 6.2 5.0* 6.0  ALBUMIN 3.0* 2.4* 2.7*    Recent Labs  06/03/13 1135 07/13/13 1325  LIPASE 9* 13    Recent Labs  06/04/13 1120  AMMONIA 65*   CBC:  Recent Labs  07/13/13 1325  08/04/13 0322  08/04/13 0811 08/05/13 0800 08/09/13 0357  WBC 6.2  < > 19.9*  --  13.2* 10.0 10.7*  NEUTROABS 4.1  --  18.1*  --   --  8.2*  --   HGB 8.7*  < > 10.1*  < > 10.0* 10.2* 10.4*  HCT 27.8*  < > 32.5*  < > 31.6* 32.5* 33.4*  MCV 98.6  < > 97.6  --  98.1 97.0 96.0  PLT 259  < > 308  --  277 280 280  < > = values in this interval not displayed. TSH:  Recent Labs  03/14/13 1208 06/03/13 1825 08/04/13 0811  TSH 1.48 2.070 2.500   A1C: Lab Results  Component Value Date   HGBA1C 5.6 06/03/2013   Lipid Panel: No results found for this basename: CHOL, HDL, LDLCALC, TRIG, CHOLHDL, LDLDIRECT,  in the last 8760 hours   Assessment/Plan  1. Persistent atrial fibrillation Rate controlled at this time not a candidate for long-term anticoagulation due to recent GI  bleeding (May 2015)   2. Essential hypertension Patient is stable; continue current regimen. Will monitor and make changes as necessary.  3. Chronic diastolic congestive heart failure -pt with 2000 cc/day fluid restriction. Discussed with pt the need to stay on this and importance of compliance with fluid restriction, medications and low sodium diet. Apparently she was receiving more fluids than she was supposed to for a while. Now on strict fluid restriction and does not like this because she is always thirsty. Pt has follow up with cardiologist next week.  -will get BMP and BNP at this time   4. CONSTIPATION, CHRONIC -ongoing, conts facility protocol and PRNs  5. Chronic pain moderate control at this time. Will cont current medications  6. Vit d Def conts on vit d supplements

## 2013-11-26 ENCOUNTER — Encounter: Payer: Self-pay | Admitting: Internal Medicine

## 2013-11-26 ENCOUNTER — Other Ambulatory Visit: Payer: Self-pay | Admitting: *Deleted

## 2013-11-26 ENCOUNTER — Non-Acute Institutional Stay (SKILLED_NURSING_FACILITY): Payer: Medicare Other | Admitting: Internal Medicine

## 2013-11-26 DIAGNOSIS — I10 Essential (primary) hypertension: Secondary | ICD-10-CM

## 2013-11-26 DIAGNOSIS — E871 Hypo-osmolality and hyponatremia: Secondary | ICD-10-CM

## 2013-11-26 DIAGNOSIS — E876 Hypokalemia: Secondary | ICD-10-CM

## 2013-11-26 DIAGNOSIS — I5042 Chronic combined systolic (congestive) and diastolic (congestive) heart failure: Secondary | ICD-10-CM

## 2013-11-26 DIAGNOSIS — J9611 Chronic respiratory failure with hypoxia: Secondary | ICD-10-CM

## 2013-11-26 MED ORDER — TRAMADOL HCL 50 MG PO TABS: ORAL_TABLET | ORAL | Status: DC

## 2013-11-26 NOTE — Assessment & Plan Note (Addendum)
Pt not wearing her O2 today, while at rest in [facility] but it machine is on and  Running. Denies SOB.

## 2013-11-26 NOTE — Assessment & Plan Note (Addendum)
Spoke with nursing initially per a phone call. Per nursing pt has slowly picked up something like 20 pounds to 251pounds. Pt denies SOB.  Lasix was inc from 60 mg BID to 80 mg BIDa week or so ago. Reported today pt gained about 2 pounds over weekend. Plan d/c lasix and start demadex 40 mg BID with K+ 20 meq BID until pt at baseline of 230 lbs. Will get weekly BMP to watch BUN/CR which on 10/12 was 29/1.16. Contniue to weigh Monday,wed, Fri. Spoke with pt about fluid restriction and why because she c/o about fluid restriction.  I don't think she cares.

## 2013-11-26 NOTE — Progress Notes (Signed)
MRN: [id number] Name: [name]  Sex: female Age: 79 y.o. DOB: 08/11/1934  [place] #: [name] Facility/Room: 782N Level Of Care: SNF Provider: Inocencio Homes D Emergency Contacts: Extended Emergency Contact Information Primary Emergency Contact: Pasternak,Reggie Address: [place], [place] [address] [place] Phone: [phone] Work Phone: [phone] Relation: Son Secondary Emergency Contact: Ruiter,[name]          [place], [place] Phone: [phone] Relation: Daughter  Code Status: FULL  Allergies: Levaquin; Penicillins; Tetanus-diphtheria toxoids td; and Latex  Chief Complaint  Patient presents with  . Medical Management of Chronic Issues    HPI: Patient is 79 y.o. female who is being seen today for fluid management.  Past Medical History  Diagnosis Date  . HTN (hypertension)   . Diverticulosis   . Hemorrhoid   . Osteoarthritis   . Constipation, chronic   . H/O: hysterectomy   . Systolic and diastolic CHF, chronic     a. Echo 07/2011: Technically limited; inferior HK, inferolateral and possibly lateral wall HK, EF 40%, moderate LVE, aortic sclerosis without stenosis, mild MR, mild LAE, question RV dysfunction;   b.  Echo (04/2013): EF 50-55%, normal wall motion, moderate MR, mild to moderate LAE, PASP 37  . Female bladder prolapse     with prolapse uterus, cystocele, s/p TVH/BSO 12/2006  . Obesity (BMI 30-39.9)     wt. 94.1 kg 12/2010  . Moderate mitral regurgitation     a. Mod to Sev by echo 01/2009;  b. mild MR by echo 2013  . Atrial fibrillation 10/13/2011    coumadin Rx  . Iron deficiency anemia     Past Surgical History  Procedure Laterality Date  . Wrist surgery    . Vaginal hysterectomy,cystocele and prolapse  repair  NOV. 2008  . Abdominal hysterectomy    . Colonoscopy N/A 07/18/2013    Procedure: COLONOSCOPY;  Surgeon: Wonda Horner, MD;  Location: [facility];  Service:  Endoscopy;  Laterality: N/A;      Medication List       This list is accurate as of: 11/26/13 12:53 PM.  Always use your most recent med list.               albuterol (2.5 MG/3ML) 0.083% nebulizer solution  Commonly known as:  PROVENTIL  Take 2.5 mg by nebulization every 8 (eight) hours as needed for wheezing or shortness of breath.     carvedilol 3.125 MG tablet  Commonly known as:  COREG  Take 3.125 mg by mouth 2 (two) times daily with a meal. Hold if SBP <100     feeding supplement (PRO-STAT SUGAR FREE 64) Liqd  Take 30 mLs by mouth daily.     FERRETTS 325 (106 FE) MG Tabs tablet  Generic drug:  ferrous fumarate  Take 1 tablet by mouth 2 (two) times daily. 8am and 4pm     furosemide 20 MG tablet  Commonly known as:  LASIX  Take 60 mg by mouth 2 (two) times daily. For CHF. Give extra 40 mg lasix and potassium 20 meq by mouth if resident weight increase by 3 lbs in 24 hours     HYDROcodone-acetaminophen 5-325 MG per tablet  Commonly known as:  NORCO/VICODIN  Take 1 tablet by mouth every 8 (eight) hours as needed (pain).     lisinopril 5 MG tablet  Commonly known as:  PRINIVIL,ZESTRIL  1/2 by  mouth daily for hypertension     lubiprostone 24 MCG capsule  Commonly known as:  AMITIZA  Take 1 capsule (24 mcg total) by mouth 2 (two) times daily with a meal.     magnesium hydroxide 400 MG/5ML suspension  Commonly known as:  MILK OF MAGNESIA  Take 30 mLs by mouth daily as needed for mild constipation.     pantoprazole 40 MG tablet  Commonly known as:  PROTONIX  Take 1 tablet (40 mg total) by mouth 2 (two) times daily.     potassium chloride SA 20 MEQ tablet  Commonly known as:  K-DUR,KLOR-CON  Take 20 mEq by mouth daily. Give extra 40 mg lasix and potassium 20 meq by mouth if resident weight increase by 3 lbs in 24 hours     predniSONE 20 MG tablet  Commonly known as:  DELTASONE  2 by mouth at breakfast for arthritis     promethazine 25 MG tablet  Commonly known  as:  PHENERGAN  Take 25 mg by mouth every 6 (six) hours as needed for nausea or vomiting.     traMADol 50 MG tablet  Commonly known as:  ULTRAM  Take one tablet by mouth every 8 hours as needed for moderate pain     Vitamin D (Ergocalciferol) 50000 UNITS Caps capsule  Commonly known as:  DRISDOL  Take 50,000 Units by mouth every 7 (seven) days. On Sundays        No orders of the defined types were placed in this encounter.     There is no immunization history on file for this patient.  History  Substance Use Topics  . Smoking status: Never Smoker   . Smokeless tobacco: Current User    Types: Snuff     Comment: Uses Snuff regularly.  Dips several x/day.  A "large can" can last her up to 3 mos.  . Alcohol Use: No    Review of Systems  DATA OBTAINED: from patient, nurse GENERAL:  no fevers, fatigue; c/o thirsty all the time and hungry because food has been decreased SKIN: No itching, rash HEENT: No complaint RESPIRATORY: No cough, wheezing, SOB CARDIAC: No chest pain, palpitations, lower extremity edema  GI: No abdominal pain, No N/V/D or constipation, No heartburn or reflux  GU: No dysuria, frequency or urgency, or incontinence  MUSCULOSKELETAL: No unrelieved bone/joint pain NEUROLOGIC: No headache, dizziness  PSYCHIATRIC: No overt anxiety or sadness  Filed Vitals:   11/26/13 1217  BP: 126/72  Pulse: 78  Temp: 97.6 F (36.4 C)  Resp: 20    Physical Exam  GENERAL APPEARANCE: Alert, conversant, obese BF No acute distress  SKIN: No diaphoresis rash, or wounds HEENT: Unremarkable RESPIRATORY: Breathing is even, unlabored. Lung sounds are slt coarse but full   CARDIOVASCULAR: Heart RRR no murmurs, rubs or gallops. 1+ peripheral edema  GASTROINTESTINAL: Abdomen is soft, non-tender, not distended w/ normal bowel sounds.  GENITOURINARY: Bladder non tender, not distended  MUSCULOSKELETAL: No abnormal joints or musculature NEUROLOGIC: Cranial nerves 2-12 grossly  intact. Moves all extremities PSYCHIATRIC: Mood and affect appropriate to situation, no behavioral issues  Patient Active Problem List   Diagnosis Date Noted  . Chronic kidney disease (CKD), stage II (mild) 10/18/2013  . Chronic respiratory failure 08/28/2013  . Chronic diastolic heart failure 43/15/4008  . Immobility 08/15/2013  . Acute respiratory failure 08/13/2013  . GERD (gastroesophageal reflux disease) 08/13/2013  . CHF (congestive heart failure) 08/04/2013  . Congestive heart failure, NYHA class 4 08/04/2013  .  GI bleed 07/13/2013  . History of lower GI bleeding 07/11/2013  . AKI (acute kidney injury) 06/29/2013  . Acute lower GI bleeding 06/23/2013  . PNA (pneumonia) 06/19/2013  . Rash and nonspecific skin eruption 06/19/2013  . Chronic respiratory failure with hypoxia 06/18/2013  . Acute encephalopathy 06/07/2013  . Diastolic CHF 40/98/1191  . Pulmonary hypertension 06/05/2013  . Weakness 06/03/2013  . Encounter for therapeutic drug monitoring 04/10/2013  . Long term current use of anticoagulant 10/26/2011  . Hypokalemia 10/15/2011  . Abdominal pain 10/15/2011  . Iron deficiency anemia 10/15/2011  . Bladder prolapse, female, acquired 10/15/2011  . Atrial fibrillation 10/15/2011  . Acute on chronic combined systolic and diastolic congestive heart failure 07/22/2011  . Facial edema 12/30/2010  . Leukopenia 12/30/2010  . Neutropenia 12/30/2010  . Abdominal pain, unspecified site 12/27/2010  . Chronic pain 12/25/2010  . Hematochezia 12/25/2010  . Dehydration 12/25/2010  . Thrombocytosis 12/25/2010  . Hyponatremia 12/25/2010  . Chronic combined systolic and diastolic CHF, NYHA class 4   . Female bladder prolapse   . Mitral regurgitation 06/04/2010  . HYPOPOTASSEMIA 09/24/2009  . COMBINED HEART FAILURE, CHRONIC 09/09/2009  . CARDIOMYOPATHY 03/04/2009  . ANASARCA 03/04/2009  . Essential hypertension 02/20/2009  . CONSTIPATION, CHRONIC 02/20/2009  . DIVERTICULOSIS  02/20/2009    CBC    Component Value Date/Time   WBC 10.7* 08/09/2013 0357   RBC 3.48* 08/09/2013 0357   RBC 1.95* 06/23/2013 1118   HGB 10.4* 08/09/2013 0357   HCT 33.4* 08/09/2013 0357   PLT 280 08/09/2013 0357   MCV 96.0 08/09/2013 0357   LYMPHSABS 1.3 08/05/2013 0800   MONOABS 0.4 08/05/2013 0800   EOSABS 0.1 08/05/2013 0800   BASOSABS 0.0 08/05/2013 0800    CMP     Component Value Date/Time   NA 135* 08/15/2013 1044   K 4.2 08/15/2013 1044   CL 93* 08/15/2013 1044   CO2 26 08/15/2013 1044   GLUCOSE 103* 08/15/2013 1044   BUN 52* 08/15/2013 1044   CREATININE 1.11* 08/15/2013 1044   CALCIUM 8.8 08/15/2013 1044   PROT 6.0 08/05/2013 0800   ALBUMIN 2.7* 08/05/2013 0800   AST 33 08/05/2013 0800   ALT 86* 08/05/2013 0800   ALKPHOS 120* 08/05/2013 0800   BILITOT 0.3 08/05/2013 0800   GFRNONAA 47* 08/15/2013 1044   GFRAA 54* 08/15/2013 1044    Assessment and Plan  Chronic combined systolic and diastolic CHF, NYHA class 4 Spoke with nursing initially per a phone call. Per nursing pt has slowly picked up something like 20 pounds to 251pounds. Pt denies SOB.  Lasix was inc from 60 mg BID to 80 mg BIDa week or so ago. Reported today pt gained about 2 pounds over weekend. Plan d/c lasix and start demadex 40 mg BID with K+ 20 meq BID until pt at baseline of 230 lbs. Will get weekly BMP to watch BUN/CR which on 10/12 was 29/1.16. Contniue to weigh Monday,wed, Fri. Spoke with pt about fluid restriction and why because she c/o about fluid restriction.  I don't think she cares.  Essential hypertension Bp today was good, not too low or high. Continue current meds with change of lasix to demadex.  Chronic respiratory failure with hypoxia Pt wearing her O2 today, while at rest in [facility].Denies SOB.  Hypokalemia Even though pt on ACE has been betting K+ with her lasix and last K+ was only 3.8. Changing from Lasix to demadex will inc K+ to BId.    Hennie Duos,  MD     

## 2013-11-26 NOTE — Assessment & Plan Note (Signed)
Even though pt on ACE has been betting K+ with her lasix and last K+ was only 3.8. Changing from Lasix to demadex will inc K+ to BId.

## 2013-11-26 NOTE — Assessment & Plan Note (Signed)
Prior. Weekly BMP while on increased diuretic.

## 2013-11-26 NOTE — Telephone Encounter (Signed)
[facility] 

## 2013-11-26 NOTE — Assessment & Plan Note (Signed)
Bp today was good, not too low or high. Continue current meds with change of lasix to demadex.

## 2013-11-28 ENCOUNTER — Ambulatory Visit (HOSPITAL_COMMUNITY)
Admission: RE | Admit: 2013-11-28 | Discharge: 2013-11-28 | Disposition: A | Discharge status: Home / Self Care | Payer: Medicare Other | Source: Ambulatory Visit | Attending: Internal Medicine | Admitting: Internal Medicine

## 2013-11-28 ENCOUNTER — Encounter (HOSPITAL_COMMUNITY): Payer: Self-pay

## 2013-11-28 ENCOUNTER — Telehealth (HOSPITAL_COMMUNITY): Payer: Self-pay | Admitting: Vascular Surgery

## 2013-11-28 VITALS — BP 113/79 | HR 75 | Resp 22 | Wt 256.0 lb

## 2013-11-28 DIAGNOSIS — Z9981 Dependence on supplemental oxygen: Secondary | ICD-10-CM | POA: Insufficient documentation

## 2013-11-28 DIAGNOSIS — Z7409 Other reduced mobility: Secondary | ICD-10-CM | POA: Insufficient documentation

## 2013-11-28 DIAGNOSIS — I340 Nonrheumatic mitral (valve) insufficiency: Secondary | ICD-10-CM | POA: Diagnosis not present

## 2013-11-28 DIAGNOSIS — I482 Chronic atrial fibrillation: Secondary | ICD-10-CM | POA: Insufficient documentation

## 2013-11-28 DIAGNOSIS — I10 Essential (primary) hypertension: Secondary | ICD-10-CM | POA: Insufficient documentation

## 2013-11-28 DIAGNOSIS — I5042 Chronic combined systolic (congestive) and diastolic (congestive) heart failure: Secondary | ICD-10-CM | POA: Diagnosis present

## 2013-11-28 DIAGNOSIS — J9610 Chronic respiratory failure, unspecified whether with hypoxia or hypercapnia: Secondary | ICD-10-CM | POA: Insufficient documentation

## 2013-11-28 DIAGNOSIS — I5032 Chronic diastolic (congestive) heart failure: Secondary | ICD-10-CM | POA: Diagnosis not present

## 2013-11-28 DIAGNOSIS — Z7901 Long term (current) use of anticoagulants: Secondary | ICD-10-CM

## 2013-11-28 LAB — BASIC METABOLIC PANEL
ANION GAP: 16 — AB (ref 5–15)
BUN: 26 mg/dL — ABNORMAL HIGH (ref 6–23)
CHLORIDE: 98 meq/L (ref 96–112)
CO2: 28 mEq/L (ref 19–32)
Calcium: 8.9 mg/dL (ref 8.4–10.5)
Creatinine, Ser: 1.06 mg/dL (ref 0.50–1.10)
GFR calc Af Amer: 57 mL/min — ABNORMAL LOW (ref >90)
GFR calc non Af Amer: 49 mL/min — ABNORMAL LOW (ref >90)
GLUCOSE: 137 mg/dL — AB (ref 70–99)
Potassium: 3.8 mEq/L (ref 3.7–5.3)
SODIUM: 142 meq/L (ref 137–147)

## 2013-11-28 LAB — PRO B NATRIURETIC PEPTIDE: Pro B Natriuretic peptide (BNP): 2108 pg/mL — ABNORMAL HIGH (ref 0–450)

## 2013-11-28 MED ORDER — METOLAZONE 2.5 MG PO TABS: 2.5000 mg | ORAL_TABLET | ORAL | Status: DC

## 2013-11-28 NOTE — Progress Notes (Signed)
Patient ID: [name], female   DOB: 29-Jan-1935, 79 y.o.   MRN: [id number]  PCP: Dr Laurann Montana  Primary Cardiologist: Dr Percival Spanish  HPI: 79 year old female, nursing home resident with history of combined systolic and diastolic congestive heart failure, chronic respiratory failure on 2 liters [place]., chronic atrial fibrillation not a candidate for long-term anticoagulation due to recent GI bleeding  (May 2015) , with hospitalization for pneumonia in May 2015, and hypertension. She has been hospitalized  5 times in the last 6 months.   Admitted to [facility] 6/29 through 08/10/13 with respiratory failure and volume overload. Diuresed with IV lasix and transitioned to lasix 60 mg tiwce a day. Discharge weight was 232 pounds. She was discharged to [facility].   Follow up for Heart Failure:  Since last visit lasix changed to torsemide 40 mg BID at the facility d/t increase in weight. Can't weigh standing up. Denies SOB, CP or PND. +DOE with minimal exertion. Wears chronic 2L O2. Not active and reports she can't get up and do much. Complains of belly ache and pains all over. Trying to work with PT some. Following a low salt diet and drinking less than 2L a day.   Labs 08/09/13 K 3.5 Creatinine 1.0  Labs 08/10/13 K 3.2 Creatinine 1.2  08/15/13 K 4.2 Creatinien 1.1  SH: Currently at [facility]   ROS: All systems negative except as listed in HPI, PMH and Problem List.  Past Medical History  Diagnosis Date  . HTN (hypertension)   . Diverticulosis   . Hemorrhoid   . Osteoarthritis   . Constipation, chronic   . H/O: hysterectomy   . Systolic and diastolic CHF, chronic     a. Echo 07/2011: Technically limited; inferior HK, inferolateral and possibly lateral wall HK, EF 40%, moderate LVE, aortic sclerosis without stenosis, mild MR, mild LAE, question RV dysfunction;   b.  Echo (04/2013): EF 50-55%, normal wall motion, moderate MR, mild to moderate LAE, PASP 37  . Female bladder prolapse     with prolapse uterus,  cystocele, s/p TVH/BSO 12/2006  . Obesity (BMI 30-39.9)     wt. 94.1 kg 12/2010  . Moderate mitral regurgitation     a. Mod to Sev by echo 01/2009;  b. mild MR by echo 2013  . Atrial fibrillation 10/13/2011    coumadin Rx  . Iron deficiency anemia     Current Outpatient Prescriptions  Medication Sig Dispense Refill  . albuterol (PROVENTIL) (2.5 MG/3ML) 0.083% nebulizer solution Take 2.5 mg by nebulization every 8 (eight) hours as needed for wheezing or shortness of breath.      . Alum & Mag Hydroxide-Simeth (MAGIC MOUTHWASH) SOLN Take 10 mLs by mouth 2 (two) times daily.      . Amino Acids-Protein Hydrolys (FEEDING SUPPLEMENT, PRO-STAT SUGAR FREE 64,) LIQD Take 30 mLs by mouth daily.       . bisacodyl (DULCOLAX) 10 MG suppository Place 10 mg rectally daily as needed for moderate constipation (if constipation not relieved by MOM).      . carvedilol (COREG) 3.125 MG tablet Take 3.125 mg by mouth 2 (two) times daily with a meal. Hold if SBP <100      . ferrous fumarate (FERRETTS) 325 (106 FE) MG TABS Take 1 tablet by mouth 2 (two) times daily. 8am and 4pm      . HYDROcodone-acetaminophen (NORCO/VICODIN) 5-325 MG per tablet Take 1 tablet by mouth every 8 (eight) hours as needed (pain).  90 tablet  0  .  lisinopril (PRINIVIL,ZESTRIL) 5 MG tablet 1/2 by mouth daily for hypertension      . lubiprostone (AMITIZA) 24 MCG capsule Take 1 capsule (24 mcg total) by mouth 2 (two) times daily with a meal.  30 capsule  0  . magnesium hydroxide (MILK OF MAGNESIA) 400 MG/5ML suspension Take 30 mLs by mouth daily as needed for mild constipation.      . pantoprazole (PROTONIX) 40 MG tablet Take 1 tablet (40 mg total) by mouth 2 (two) times daily.      . polyethylene glycol (MIRALAX / GLYCOLAX) packet Take 17 g by mouth daily.      . potassium chloride SA (K-DUR,KLOR-CON) 20 MEQ tablet Take 20 mEq by mouth daily. Give extra 40 mg lasix and potassium 20 meq by mouth if resident weight increase by 3 lbs in 24 hours       . predniSONE (DELTASONE) 20 MG tablet 2 by mouth at breakfast for arthritis      . promethazine (PHENERGAN) 25 MG tablet Take 25 mg by mouth every 6 (six) hours as needed for nausea or vomiting.      . Sodium Phosphates (RA SALINE ENEMA RE) Place 1 Package rectally daily as needed (if constipation not relieved by dulcolax suppository after MOM).      . torsemide (DEMADEX) 20 MG tablet Take 40 mg by mouth daily. Take 40mg  twice daily if weight above 230 lbs      . traMADol (ULTRAM) 50 MG tablet Take one tablet by mouth every 8 hours as needed for moderate pain  90 tablet  5  . Vitamin D, Ergocalciferol, (DRISDOL) 50000 UNITS CAPS capsule Take 50,000 Units by mouth every 7 (seven) days. On Sundays       No current facility-administered medications for this encounter.    Filed Vitals:   11/28/13 1032  BP: 113/79  Pulse: 75  Resp: 22  Weight: 256 lb (116.121 kg)  SpO2: 98%   PHYSICAL EXAM: General:  Chronically ill appearing. No resp difficulty on 2 liters [place].In a wheelchair.  Daughter present HEENT: normal Neck: supple. JVP difficult to assess d/t body habitus but appears 9; Carotids 2+ bilaterally; no bruits. No lymphadenopathy or thryomegaly appreciated. Cor: PMI normal. Irregular rhythm and regular rate; No rubs, gallops or murmurs. Lungs: Decreased in the bases on 2 liters .  Abdomen: obese,soft, nontender, nondistended. No hepatosplenomegaly. No bruits or masses. Good bowel sounds. Extremities: no cyanosis, clubbing, rash, 1-2 + bilateral edema Neuro: alert & orientedx3, cranial nerves grossly intact. Moves all 4 extremities w/o difficulty. Affect pleasant.    ASSESSMENT & PLAN:  1.  Chronic Diastolic Heart Failure: - NYHA III-IV symptoms and volume status mildly elevated. She was just switched to torsemide 40 mg BID from lasix. Will start metolazone 2.5 mg on Wednesdays. Check BMET and pro-BNP today with BMET and pro-BNP in 7-10 days.  - SBP stable.  - Reinforced the need  and importance of daily weights, a low sodium diet, and fluid restriction (less than 2 L a day). Instructed to call the HF clinic if weight increases more than 3 lbs overnight or 5 lbs in a week.  2. HTN: - Stable continue current medications.  3. Chronic A fib  - on carvedilol 3.125 mg twice a day. Not on anticoagulants due to GI bleeds. Most recent GI bleed 06/2013 .  4. Immobility: - Reports she is no longer in PT at the facility. Have asked to please restart PT and be as active as possible.  5. H/O GI bleeds- on Ferrous Sulfate and Protonix. Per primary doctor at [facility]. No s/s of bleeding currently.  6. Chronic Respiratory Failure: - Continue on 2L O2 continuous.    F/U 1 month  Junie Bame B NP-C  10:54 AM

## 2013-11-28 NOTE — Telephone Encounter (Signed)
Vondra the Director of nursing from [facility] called about pt appt.. She wants to talk to a nurse about pt appt and medications

## 2013-11-29 NOTE — Telephone Encounter (Signed)
Returned call to [facility], left message with return number to call back if still having questions/concerns about patient's meds and next apt. Renee Pain

## 2013-12-10 ENCOUNTER — Encounter (HOSPITAL_COMMUNITY): Payer: Self-pay

## 2013-12-17 ENCOUNTER — Non-Acute Institutional Stay (SKILLED_NURSING_FACILITY): Payer: Medicare Other | Admitting: Nurse Practitioner

## 2013-12-17 DIAGNOSIS — K219 Gastro-esophageal reflux disease without esophagitis: Secondary | ICD-10-CM

## 2013-12-17 DIAGNOSIS — K5909 Other constipation: Secondary | ICD-10-CM

## 2013-12-17 DIAGNOSIS — I5032 Chronic diastolic (congestive) heart failure: Secondary | ICD-10-CM

## 2013-12-17 DIAGNOSIS — G8929 Other chronic pain: Secondary | ICD-10-CM

## 2013-12-17 DIAGNOSIS — I10 Essential (primary) hypertension: Secondary | ICD-10-CM

## 2013-12-17 NOTE — Progress Notes (Signed)
Patient ID: [name], female   DOB: 09-27-1934, 79 y.o.   MRN: [id number]    Nursing Home Location:  [facility]   Place of Service: SNF (44)  PCP: Osborne Casco, MD  Allergies  Allergen Reactions  . Levaquin [Levofloxacin] Other (See Comments)    Per MAR  . Penicillins Other (See Comments)    Per MAR  . Tetanus-Diphtheria Toxoids Td Other (See Comments)    Per MAR  . Latex Hives and Rash    Chief Complaint  Patient presents with  . Medical Management of Chronic Issues    Routine Visit     HPI:  Patient is a 79 y.o. female seen today at [facility] for routine follow up. ot with pmh of CHF, OA, CKD, Chronic respiratory failure with hypoxia, and HTN. Pt has had multiple hospitalization due to CHF and follows with cardiology and has also had many visits in the facility due to weight gain. Recently with changes in her medication to [facility] and zaroxolyn. Pt weight is stable at 240 lbs over the last several days. Pt reports her pain is controlled at this time. Bowels moving without difficulty. Denies GERD.    Review of Systems:  Review of Systems  Constitutional: Negative for activity change, appetite change, fatigue and unexpected weight change.  HENT: Negative for congestion and hearing loss.   Eyes: Negative.   Respiratory: Negative for cough and shortness of breath.        Chronic shortness of breath, this has remained unchanged  Cardiovascular: Negative for chest pain, palpitations and leg swelling.  Gastrointestinal: Negative for abdominal pain, diarrhea and constipation.  Genitourinary: Negative for dysuria and difficulty urinating.  Musculoskeletal: Positive for arthralgias. Negative for myalgias.  Skin: Negative for color change and wound.  Neurological: Negative for dizziness and weakness.  Psychiatric/Behavioral: Negative for behavioral problems, confusion and agitation.    Past Medical History  Diagnosis Date  . HTN  (hypertension)   . Diverticulosis   . Hemorrhoid   . Osteoarthritis   . Constipation, chronic   . H/O: hysterectomy   . Systolic and diastolic CHF, chronic     a. Echo 07/2011: Technically limited; inferior HK, inferolateral and possibly lateral wall HK, EF 40%, moderate LVE, aortic sclerosis without stenosis, mild MR, mild LAE, question RV dysfunction;   b.  Echo (04/2013): EF 50-55%, normal wall motion, moderate MR, mild to moderate LAE, PASP 37  . Female bladder prolapse     with prolapse uterus, cystocele, s/p TVH/BSO 12/2006  . Obesity (BMI 30-39.9)     wt. 94.1 kg 12/2010  . Moderate mitral regurgitation     a. Mod to Sev by echo 01/2009;  b. mild MR by echo 2013  . Atrial fibrillation 10/13/2011    coumadin Rx  . Iron deficiency anemia    Past Surgical History  Procedure Laterality Date  . Wrist surgery    . Vaginal hysterectomy,cystocele and prolapse  repair  NOV. 2008  . Abdominal hysterectomy    . Colonoscopy N/A 07/18/2013    Procedure: COLONOSCOPY;  Surgeon: Wonda Horner, MD;  Location: [facility];  Service: Endoscopy;  Laterality: N/A;   Social History:   reports that she has never smoked. Her smokeless tobacco use includes Snuff. She reports that she does not drink alcohol or use illicit drugs.  Family History  Problem Relation Age of Onset  . Diabetes Brother   . Hypertension Mother     deceased @  10  . Leukemia Brother   . Cancer Brother   . Stroke Mother   . Diabetes Father     deceased in his 49's    Medications: Patient's Medications  New Prescriptions   No medications on file  Previous Medications   ALBUTEROL (PROVENTIL) (2.5 MG/3ML) 0.083% NEBULIZER SOLUTION    Take 2.5 mg by nebulization every 8 (eight) hours as needed for wheezing or shortness of breath (For wheezing and shortness of breath).    AMINO ACIDS-PROTEIN HYDROLYS (FEEDING SUPPLEMENT, PRO-STAT SUGAR FREE 64,) LIQD    Take 30 mLs by mouth daily.    BISACODYL (DULCOLAX) 10 MG SUPPOSITORY     Place 10 mg rectally daily as needed for moderate constipation (if constipation not relieved by MOM).   CARVEDILOL (COREG) 3.125 MG TABLET    Take 3.125 mg by mouth 2 (two) times daily with a meal. Hold if SBP <100   ESCITALOPRAM (LEXAPRO) 5 MG TABLET    Take 5 mg by mouth daily. For Depression/anxiety   FERROUS FUMARATE (FERRETTS) 325 (106 FE) MG TABS    Take 1 tablet by mouth 2 (two) times daily. 8am and 4pm for Iron Deficiency   HYDROCODONE-ACETAMINOPHEN (NORCO/VICODIN) 5-325 MG PER TABLET    Take 1 tablet by mouth every 8 (eight) hours as needed (pain).   LISINOPRIL (PRINIVIL,ZESTRIL) 2.5 MG TABLET    Take 2.5 mg by mouth daily. For hypertension   LUBIPROSTONE (AMITIZA) 24 MCG CAPSULE    Take 1 capsule (24 mcg total) by mouth 2 (two) times daily with a meal.   MAGNESIUM HYDROXIDE (MILK OF MAGNESIA) 400 MG/5ML SUSPENSION    Take 30 mLs by mouth daily as needed for mild constipation.   METOLAZONE (ZAROXOLYN) 2.5 MG TABLET    Take 1 tablet (2.5 mg total) by mouth every Wednesday.   PANTOPRAZOLE (PROTONIX) 40 MG TABLET    Take 1 tablet (40 mg total) by mouth 2 (two) times daily.   POLYETHYLENE GLYCOL (MIRALAX / GLYCOLAX) PACKET    Take 17 g by mouth daily.   POTASSIUM CHLORIDE SA (K-DUR,KLOR-CON) 20 MEQ TABLET    Take 20 mEq by mouth 2 (two) times daily. Give extra 40 mg lasix and potassium 20 meq by mouth if resident weight increase by 3 lbs in 24 hours   PREDNISONE (DELTASONE) 20 MG TABLET    2 by mouth at breakfast for arthritis   PROMETHAZINE (PHENERGAN) 25 MG TABLET    Take 25 mg by mouth every 6 (six) hours as needed for nausea or vomiting.   SODIUM PHOSPHATES (RA SALINE ENEMA RE)    Place 1 Package rectally daily as needed (if constipation not relieved by dulcolax suppository after MOM).   TORSEMIDE (DEMADEX) 20 MG TABLET    Take 3 by mouth twice daily for acute or chronic combined systolic and diastolic heart failure   TRAMADOL (ULTRAM) 50 MG TABLET    Take one tablet by mouth every 8 hours  as needed for moderate pain   VITAMIN D, ERGOCALCIFEROL, (DRISDOL) 50000 UNITS CAPS CAPSULE    Take 50,000 Units by mouth every 7 (seven) days. On Sundays, for low vit D  Modified Medications   No medications on file  Discontinued Medications   LISINOPRIL (PRINIVIL,ZESTRIL) 5 MG TABLET    1/2 by mouth daily for hypertension     Physical Exam: Filed Vitals:   12/17/13 1551  BP: 144/96  Pulse: 96  Temp: 97.7 F (36.5 C)  TempSrc: Oral  Resp: 20  Weight: 240 lb (108.863 kg)    Physical Exam  Constitutional: She appears well-developed and well-nourished. No distress.  HENT:  Head: Normocephalic and atraumatic.  Mouth/Throat: Oropharynx is clear and moist. No oropharyngeal exudate.  Eyes: Conjunctivae are normal. Pupils are equal, round, and reactive to light.  Neck: Normal range of motion. Neck supple.  Cardiovascular: Normal rate and regular rhythm.   Murmur heard. Pulmonary/Chest: Effort normal and breath sounds normal.  Abdominal: Soft. Bowel sounds are normal.  Musculoskeletal: She exhibits edema (trace to bilateral LE). She exhibits no tenderness.  Neurological: She is alert.  Skin: Skin is warm and dry. She is not diaphoretic.  Psychiatric: She has a normal mood and affect.    Labs reviewed: Basic Metabolic Panel:  Recent Labs  06/03/13 1825  06/05/13 1200 06/06/13 0233  08/10/13 0417 08/15/13 1044 11/28/13 1110  NA  --   < >  --  129*  < > 131* 135* 142  K  --   < > 4.1 3.8  < > 3.2* 4.2 3.8  CL  --   < >  --  96  < > 82* 93* 98  CO2  --   < >  --  22  < > 35* 26 28  GLUCOSE  --   < >  --  90  < > 87 103* 137*  BUN  --   < >  --  29*  < > 34* 52* 26*  CREATININE  --   < >  --  1.45*  < > 1.21* 1.11* 1.06  CALCIUM  --   < >  --  7.4*  < > 8.9 8.8 8.9  MG 2.6*  --  2.0 1.9  --   --   --   --   PHOS 3.2  --   --   --   --   --   --   --   < > = values in this interval not displayed. Liver Function Tests:  Recent Labs  07/13/13 1325 07/14/13 0541  08/05/13 0800  AST 11 8 33  ALT 10 9 86*  ALKPHOS 88 79 120*  BILITOT 0.2* 0.3 0.3  PROT 6.2 5.0* 6.0  ALBUMIN 3.0* 2.4* 2.7*    Recent Labs  06/03/13 1135 07/13/13 1325  LIPASE 9* 13    Recent Labs  06/04/13 1120  AMMONIA 65*   CBC:  Recent Labs  07/13/13 1325  08/04/13 0322  08/04/13 0811 08/05/13 0800 08/09/13 0357  WBC 6.2  < > 19.9*  --  13.2* 10.0 10.7*  NEUTROABS 4.1  --  18.1*  --   --  8.2*  --   HGB 8.7*  < > 10.1*  < > 10.0* 10.2* 10.4*  HCT 27.8*  < > 32.5*  < > 31.6* 32.5* 33.4*  MCV 98.6  < > 97.6  --  98.1 97.0 96.0  PLT 259  < > 308  --  277 280 280  < > = values in this interval not displayed. TSH:  Recent Labs  03/14/13 1208 06/03/13 1825 08/04/13 0811  TSH 1.48 2.070 2.500   A1C: Lab Results  Component Value Date   HGBA1C 5.6 06/03/2013   Lipid Panel: No results for input(s): CHOL, HDL, LDLCALC, TRIG, CHOLHDL, LDLDIRECT in the last 8760 hours.   Assessment/Plan  1. Gastroesophageal reflux disease without esophagitis Controlled on current medications   2. CONSTIPATION, CHRONIC Stable at this time  3. Chronic diastolic heart failure  Does not like the fluid restriction but reports she is compliant, she has 4 drinks on her tray at the time of visit. Reinforced importance of compliance with medication and fluid restrictions.  Weight has been stable over last 4 days and without worsening shortness of breath or edema Conts to follow up with heart failure clinic  4. Essential hypertension Stable at this time, will cont to monitor and make changes as necessary   5. Chronic pain Controlled at this time without exacerbation in pain

## 2013-12-31 ENCOUNTER — Encounter (HOSPITAL_COMMUNITY): Payer: Self-pay

## 2013-12-31 ENCOUNTER — Ambulatory Visit (HOSPITAL_COMMUNITY)
Admission: RE | Admit: 2013-12-31 | Discharge: 2013-12-31 | Disposition: A | Discharge status: Home / Self Care | Payer: Medicare Other | Source: Ambulatory Visit | Attending: Internal Medicine | Admitting: Internal Medicine

## 2013-12-31 VITALS — BP 119/62 | HR 78 | Resp 20 | Wt 243.1 lb

## 2013-12-31 DIAGNOSIS — I482 Chronic atrial fibrillation: Secondary | ICD-10-CM | POA: Insufficient documentation

## 2013-12-31 DIAGNOSIS — M623 Immobility syndrome (paraplegic): Secondary | ICD-10-CM | POA: Insufficient documentation

## 2013-12-31 DIAGNOSIS — Z7409 Other reduced mobility: Secondary | ICD-10-CM

## 2013-12-31 DIAGNOSIS — J9610 Chronic respiratory failure, unspecified whether with hypoxia or hypercapnia: Secondary | ICD-10-CM | POA: Insufficient documentation

## 2013-12-31 DIAGNOSIS — Z8719 Personal history of other diseases of the digestive system: Secondary | ICD-10-CM

## 2013-12-31 DIAGNOSIS — I10 Essential (primary) hypertension: Secondary | ICD-10-CM | POA: Diagnosis not present

## 2013-12-31 DIAGNOSIS — J9611 Chronic respiratory failure with hypoxia: Secondary | ICD-10-CM

## 2013-12-31 DIAGNOSIS — I5032 Chronic diastolic (congestive) heart failure: Secondary | ICD-10-CM | POA: Diagnosis not present

## 2013-12-31 NOTE — Patient Instructions (Signed)
Follow up in 2 months.  Happy Thanksgiving!  Do the following things EVERYDAY: 1) Weigh yourself in the morning before breakfast. Write it down and keep it in a log. 2) Take your medicines as prescribed 3) Eat low salt foods-Limit salt (sodium) to 2000 mg per day.  4) Stay as active as you can everyday 5) Limit all fluids for the day to less than 2 liters

## 2013-12-31 NOTE — Progress Notes (Signed)
Patient ID: [name], female   DOB: November 24, 1934, 79 y.o.   MRN: [id number]  PCP: Dr Laurann Montana  Primary Cardiologist: Dr Percival Spanish  HPI: 79 year old female, nursing home resident with history of combined systolic and diastolic congestive heart failure, chronic respiratory failure on 2 liters [place]., chronic atrial fibrillation not a candidate for long-term anticoagulation due to recent GI bleeding  (May 2015) , with hospitalization for pneumonia in May 2015, and hypertension.   Admitted to [facility] 6/29 through 08/10/13 with respiratory failure and volume overload. Diuresed with IV lasix and transitioned to lasix 60 mg tiwce a day. Discharge weight was 232 pounds. She was discharged to [facility].   Follow up for Heart Failure:  Since last visit metolazone 2.5 mg was added every Wednesday. Doing ok. Breathing at baseline. Mild dyspnea with exertion. Not currently working with therapy.  Following a low salt diet and but drinking > 2L a day. Low salt diet at SNF.  Remains at SNF.   Labs 08/04/13 Pro BNP 4718  Labs 08/09/13 K 3.5 Creatinine 1.0  Labs 08/10/13 K 3.2 Creatinine 1.2  08/15/13 K 4.2 Creatinien 1.1 11/28/13 K 3.8 Creatinine 1.06 Pro BNP 2108  SH: Currently at [facility]   ROS: All systems negative except as listed in HPI, PMH and Problem List.  Past Medical History  Diagnosis Date  . HTN (hypertension)   . Diverticulosis   . Hemorrhoid   . Osteoarthritis   . Constipation, chronic   . H/O: hysterectomy   . Systolic and diastolic CHF, chronic     a. Echo 07/2011: Technically limited; inferior HK, inferolateral and possibly lateral wall HK, EF 40%, moderate LVE, aortic sclerosis without stenosis, mild MR, mild LAE, question RV dysfunction;   b.  Echo (04/2013): EF 50-55%, normal wall motion, moderate MR, mild to moderate LAE, PASP 37  . Female bladder prolapse     with prolapse uterus, cystocele, s/p TVH/BSO 12/2006  . Obesity (BMI 30-39.9)     wt. 94.1 kg 12/2010  . Moderate mitral  regurgitation     a. Mod to Sev by echo 01/2009;  b. mild MR by echo 2013  . Atrial fibrillation 10/13/2011    coumadin Rx  . Iron deficiency anemia     Current Outpatient Prescriptions  Medication Sig Dispense Refill  . albuterol (PROVENTIL) (2.5 MG/3ML) 0.083% nebulizer solution Take 2.5 mg by nebulization every 8 (eight) hours as needed for wheezing or shortness of breath (For wheezing and shortness of breath).     . Amino Acids-Protein Hydrolys (FEEDING SUPPLEMENT, PRO-STAT SUGAR FREE 64,) LIQD Take 30 mLs by mouth daily.     . bisacodyl (DULCOLAX) 10 MG suppository Place 10 mg rectally daily as needed for moderate constipation (if constipation not relieved by MOM).    . carvedilol (COREG) 3.125 MG tablet Take 3.125 mg by mouth 2 (two) times daily with a meal. Hold if SBP <100    . escitalopram (LEXAPRO) 5 MG tablet Take 5 mg by mouth daily. For Depression/anxiety    . ferrous fumarate (FERRETTS) 325 (106 FE) MG TABS Take 1 tablet by mouth 2 (two) times daily. 8am and 4pm for Iron Deficiency    . HYDROcodone-acetaminophen (NORCO/VICODIN) 5-325 MG per tablet Take 1 tablet by mouth every 8 (eight) hours as needed (pain). 90 tablet 0  . lisinopril (PRINIVIL,ZESTRIL) 2.5 MG tablet Take 2.5 mg by mouth daily. For hypertension    . lubiprostone (AMITIZA) 24 MCG capsule Take 1 capsule (24 mcg total) by  mouth 2 (two) times daily with a meal. (Patient taking differently: Take 24 mcg by mouth 2 (two) times daily with a meal. For constipation) 30 capsule 0  . magnesium hydroxide (MILK OF MAGNESIA) 400 MG/5ML suspension Take 30 mLs by mouth daily as needed for mild constipation.    . metolazone (ZAROXOLYN) 2.5 MG tablet Take 1 tablet (2.5 mg total) by mouth every Wednesday. 10 tablet 0  . pantoprazole (PROTONIX) 40 MG tablet Take 1 tablet (40 mg total) by mouth 2 (two) times daily. (Patient taking differently: Take 40 mg by mouth 2 (two) times daily. For GERD)    . polyethylene glycol (MIRALAX /  GLYCOLAX) packet Take 17 g by mouth daily.    . potassium chloride SA (K-DUR,KLOR-CON) 20 MEQ tablet Take 20 mEq by mouth 2 (two) times daily. Give extra 40 mg lasix and potassium 20 meq by mouth if resident weight increase by 3 lbs in 24 hours    . predniSONE (DELTASONE) 20 MG tablet 2 by mouth at breakfast for arthritis    . promethazine (PHENERGAN) 25 MG tablet Take 25 mg by mouth every 6 (six) hours as needed for nausea or vomiting.    . Sodium Phosphates (RA SALINE ENEMA RE) Place 1 Package rectally daily as needed (if constipation not relieved by dulcolax suppository after MOM).    . torsemide (DEMADEX) 20 MG tablet Take 3 by mouth twice daily for acute or chronic combined systolic and diastolic heart failure    . traMADol (ULTRAM) 50 MG tablet Take one tablet by mouth every 8 hours as needed for moderate pain 90 tablet 5  . Vitamin D, Ergocalciferol, (DRISDOL) 50000 UNITS CAPS capsule Take 50,000 Units by mouth every 7 (seven) days. On Sundays, for low vit D     No current facility-administered medications for this encounter.    Filed Vitals:   12/31/13 1128  BP: 119/62  Pulse: 78  Resp: 20  Weight: 243 lb 2 oz (110.281 kg)  SpO2: 98%   PHYSICAL EXAM: General:  Chronically ill appearing. No resp difficulty on 2 liters [place].In a wheelchair.  Daughter present HEENT: normal Neck: supple. JVP difficult to assess d/t body habitus but does not appear elevated. Carotids 2+ bilaterally; no bruits. No lymphadenopathy or thryomegaly appreciated. Cor: PMI normal. Irregular rhythm and regular rate; No rubs, gallops or murmurs. Lungs: Coarse throughout. 2 liters [place].  Abdomen: obese,soft, nontender, nondistended. No hepatosplenomegaly. No bruits or masses. Good bowel sounds. Extremities: no cyanosis, clubbing, rash, no lower extremity edema.  Neuro: alert & orientedx3, cranial nerves grossly intact. Moves all 4 extremities w/o difficulty. Affect pleasant.    ASSESSMENT & PLAN:  1.  Chronic  Diastolic Heart Failure: - NYHA III-IV symptoms. Volume status stable. Continue torsemide 60mg  twice a day with 2.5 mg metolazone every Wednesday.  - Reinforced the need and importance of daily weights, a low sodium diet, and fluid restriction (less than 2 L a day). Instructed to call the HF clinic if weight increases more than 3 lbs overnight or 5 lbs in a week.  2. HTN: - Stable continue current medications.  3. Chronic A fib  - on carvedilol 3.125 mg twice a day. Not on anticoagulants due to GI bleeds. Most recent GI bleed 06/2013 .  4. Immobility: - Reordered PT/OT at SNF.  5. H/O GI bleeds- on Ferrous Sulfate and Protonix. Per primary doctor at [facility]. No s/s of bleeding currently.  6. Chronic Respiratory Failure: - Continue on 2L O2 continuous.  Follow up in HF clinic in 2 month and will likely send back to Dr Percival Spanish.  Irlene Crudup NP-C  11:31 AM

## 2014-01-07 ENCOUNTER — Non-Acute Institutional Stay (SKILLED_NURSING_FACILITY): Payer: Medicare Other | Admitting: Internal Medicine

## 2014-01-07 DIAGNOSIS — I5042 Chronic combined systolic (congestive) and diastolic (congestive) heart failure: Secondary | ICD-10-CM

## 2014-01-07 NOTE — Progress Notes (Signed)
MRN: [id number] Name: [name]  Sex: female Age: 79 y.o. DOB: [birth year]  [place] #: [name] Facility/Room: 496P Level Of Care: SNF Provider: Inocencio Homes D Emergency Contacts: Extended Emergency Contact Information Primary Emergency Contact: Straw,Reggie Address: [place], [place] [address] [place] Phone: [phone] Work Phone: [phone] Relation: Son Secondary Emergency Contact: Ruiter,[name]          [place], [place] Phone: [phone] Relation: Daughter  Code Status: FULL  Allergies: Levaquin; Penicillins; Tetanus-diphtheria toxoids td; and Latex  No chief complaint on file.   HPI: Patient is 79 y.o. female who is being seen for early AKI 2/2 diuretics.  Past Medical History  Diagnosis Date  . HTN (hypertension)   . Diverticulosis   . Hemorrhoid   . Osteoarthritis   . Constipation, chronic   . H/O: hysterectomy   . Systolic and diastolic CHF, chronic     a. Echo 07/2011: Technically limited; inferior HK, inferolateral and possibly lateral wall HK, EF 40%, moderate LVE, aortic sclerosis without stenosis, mild MR, mild LAE, question RV dysfunction;   b.  Echo (04/2013): EF 50-55%, normal wall motion, moderate MR, mild to moderate LAE, PASP 37  . Female bladder prolapse     with prolapse uterus, cystocele, s/p TVH/BSO 12/2006  . Obesity (BMI 30-39.9)     wt. 94.1 kg 12/2010  . Moderate mitral regurgitation     a. Mod to Sev by echo 01/2009;  b. mild MR by echo 2013  . Atrial fibrillation 10/13/2011    coumadin Rx  . Iron deficiency anemia     Past Surgical History  Procedure Laterality Date  . Wrist surgery    . Vaginal hysterectomy,cystocele and prolapse  repair  NOV. 2008  . Abdominal hysterectomy    . Colonoscopy N/A 07/18/2013    Procedure: COLONOSCOPY;  Surgeon: Wonda Horner, MD;  Location: [facility];  Service: Endoscopy;  Laterality: N/A;      Medication List        This list is accurate as of: 01/07/14 11:59 PM.  Always use your most recent med list.               albuterol (2.5 MG/3ML) 0.083% nebulizer solution  Commonly known as:  PROVENTIL  Take 2.5 mg by nebulization every 8 (eight) hours as needed for wheezing or shortness of breath (For wheezing and shortness of breath).     bisacodyl 10 MG suppository  Commonly known as:  DULCOLAX  Place 10 mg rectally daily as needed for moderate constipation (if constipation not relieved by MOM).     carvedilol 3.125 MG tablet  Commonly known as:  COREG  Take 3.125 mg by mouth 2 (two) times daily with a meal. Hold if SBP <100     escitalopram 5 MG tablet  Commonly known as:  LEXAPRO  Take 5 mg by mouth daily. For Depression/anxiety     feeding supplement (PRO-STAT SUGAR FREE 64) Liqd  Take 30 mLs by mouth daily.     FERRETTS 325 (106 FE) MG Tabs tablet  Generic drug:  ferrous fumarate  Take 1 tablet by mouth 2 (two) times daily. 8am and 4pm for Iron Deficiency     HYDROcodone-acetaminophen 5-325 MG per tablet  Commonly known as:  NORCO/VICODIN  Take 1 tablet by mouth every 8 (eight) hours as needed (pain).     lisinopril 2.5 MG tablet  Commonly known as:  PRINIVIL,ZESTRIL  Take 2.5 mg by mouth daily. For hypertension     lubiprostone 24 MCG capsule  Commonly known as:  AMITIZA  Take 1 capsule (24 mcg total) by mouth 2 (two) times daily with a meal.     magnesium hydroxide 400 MG/5ML suspension  Commonly known as:  MILK OF MAGNESIA  Take 30 mLs by mouth daily as needed for mild constipation.     metolazone 2.5 MG tablet  Commonly known as:  ZAROXOLYN  Take 1 tablet (2.5 mg total) by mouth every Wednesday.     pantoprazole 40 MG tablet  Commonly known as:  PROTONIX  Take 1 tablet (40 mg total) by mouth 2 (two) times daily.     polyethylene glycol packet  Commonly known as:  MIRALAX / GLYCOLAX  Take 17 g by mouth daily.     potassium chloride SA 20 MEQ tablet  Commonly known as:   K-DUR,KLOR-CON  Take 20 mEq by mouth 2 (two) times daily. Give extra 40 mg lasix and potassium 20 meq by mouth if resident weight increase by 3 lbs in 24 hours     predniSONE 20 MG tablet  Commonly known as:  DELTASONE  2 by mouth at breakfast for arthritis     promethazine 25 MG tablet  Commonly known as:  PHENERGAN  Take 25 mg by mouth every 6 (six) hours as needed for nausea or vomiting.     RA SALINE ENEMA RE  Place 1 Package rectally daily as needed (if constipation not relieved by dulcolax suppository after MOM).     torsemide 20 MG tablet  Commonly known as:  DEMADEX  Take 3 by mouth twice daily for acute or chronic combined systolic and diastolic heart failure     traMADol 50 MG tablet  Commonly known as:  ULTRAM  Take one tablet by mouth every 8 hours as needed for moderate pain     Vitamin D (Ergocalciferol) 50000 UNITS Caps capsule  Commonly known as:  DRISDOL  Take 50,000 Units by mouth every 7 (seven) days. On Sundays, for low vit D        No orders of the defined types were placed in this encounter.     There is no immunization history on file for this patient.  History  Substance Use Topics  . Smoking status: Never Smoker   . Smokeless tobacco: Current User    Types: Snuff     Comment: Uses Snuff regularly.  Dips several x/day.  A "large can" can last her up to 3 mos.  . Alcohol Use: No    Review of Systems  DATA OBTAINED: from patient GENERAL:  no fevers, fatigue, appetite changes SKIN: No itching, rash HEENT: No complaint RESPIRATORY: No cough, wheezing, no SOB CARDIAC: No chest pain, palpitations, lower extremity edema  GI: No abdominal pain, No N/V/D or constipation, No heartburn or reflux  GU: No dysuria, frequency or urgency, or incontinence  MUSCULOSKELETAL: No unrelieved bone/joint pain NEUROLOGIC: No headache, dizziness  PSYCHIATRIC: No overt anxiety or sadness  Filed Vitals:   01/07/14 1327  BP: 126/72  Pulse: 78  Temp: 97.6 F  (36.4 C)  Resp: 20    Physical Exam  GENERAL APPEARANCE: Alert, conversant, No acute distress  SKIN: No diaphoresis rash, or wounds HEENT: Unremarkable RESPIRATORY: Breathing is even, unlabored. Lung sounds are clear   CARDIOVASCULAR: Heart RRR no murmurs, rubs or gallops. 1+ peripheral edema  GASTROINTESTINAL: Abdomen is soft, non-tender, not  distended w/ normal bowel sounds.  GENITOURINARY: Bladder non tender, not distended  MUSCULOSKELETAL: No abnormal joints or musculature NEUROLOGIC: Cranial nerves 2-12 grossly intact. Moves all extremities PSYCHIATRIC: Mood and affect appropriate to situation, no behavioral issues  Patient Active Problem List   Diagnosis Date Noted  . Chronic kidney disease (CKD), stage II (mild) 10/18/2013  . Chronic respiratory failure 08/28/2013  . Chronic diastolic heart failure 03/00/9233  . Immobility 08/15/2013  . Acute respiratory failure 08/13/2013  . GERD (gastroesophageal reflux disease) 08/13/2013  . CHF (congestive heart failure) 08/04/2013  . Congestive heart failure, NYHA class 4 08/04/2013  . GI bleed 07/13/2013  . History of lower GI bleeding 07/11/2013  . AKI (acute kidney injury) 06/29/2013  . Acute lower GI bleeding 06/23/2013  . PNA (pneumonia) 06/19/2013  . Rash and nonspecific skin eruption 06/19/2013  . Chronic respiratory failure with hypoxia 06/18/2013  . Acute encephalopathy 06/07/2013  . Diastolic CHF 00/76/2263  . Pulmonary hypertension 06/05/2013  . Weakness 06/03/2013  . Encounter for therapeutic drug monitoring 04/10/2013  . Long term current use of anticoagulant 10/26/2011  . Hypokalemia 10/15/2011  . Abdominal pain 10/15/2011  . Iron deficiency anemia 10/15/2011  . Bladder prolapse, female, acquired 10/15/2011  . Atrial fibrillation 10/15/2011  . Acute on chronic combined systolic and diastolic congestive heart failure 07/22/2011  . Facial edema 12/30/2010  . Leukopenia 12/30/2010  . Neutropenia 12/30/2010  .  Chronic pain 12/25/2010  . Hematochezia 12/25/2010  . Dehydration 12/25/2010  . Thrombocytosis 12/25/2010  . Hyponatremia 12/25/2010  . Chronic combined systolic and diastolic CHF, NYHA class 4   . Female bladder prolapse   . Mitral regurgitation 06/04/2010  . HYPOPOTASSEMIA 09/24/2009  . COMBINED HEART FAILURE, CHRONIC 09/09/2009  . CARDIOMYOPATHY 03/04/2009  . ANASARCA 03/04/2009  . Essential hypertension 02/20/2009  . CONSTIPATION, CHRONIC 02/20/2009  . DIVERTICULOSIS 02/20/2009    CBC    Component Value Date/Time   WBC 10.7* 08/09/2013 0357   RBC 3.48* 08/09/2013 0357   RBC 1.95* 06/23/2013 1118   HGB 10.4* 08/09/2013 0357   HCT 33.4* 08/09/2013 0357   PLT 280 08/09/2013 0357   MCV 96.0 08/09/2013 0357   LYMPHSABS 1.3 08/05/2013 0800   MONOABS 0.4 08/05/2013 0800   EOSABS 0.1 08/05/2013 0800   BASOSABS 0.0 08/05/2013 0800    CMP     Component Value Date/Time   NA 142 11/28/2013 1110   K 3.8 11/28/2013 1110   CL 98 11/28/2013 1110   CO2 28 11/28/2013 1110   GLUCOSE 137* 11/28/2013 1110   BUN 26* 11/28/2013 1110   CREATININE 1.06 11/28/2013 1110   CALCIUM 8.9 11/28/2013 1110   PROT 6.0 08/05/2013 0800   ALBUMIN 2.7* 08/05/2013 0800   AST 33 08/05/2013 0800   ALT 86* 08/05/2013 0800   ALKPHOS 120* 08/05/2013 0800   BILITOT 0.3 08/05/2013 0800   GFRNONAA 49* 11/28/2013 1110   GFRAA 57* 11/28/2013 1110    Assessment and Plan  Chronic combined systolic and diastolic CHF, NYHA class 4 Pt has required demadex instead of laix and a large dose to get her to a dry weight. This vigorous diuresis has lead to the inevitable, a BUN 85, Cr 1.64. Plan to hold 2 doses then decrease demadex from 60 mg BID to 40 mg BID and recheck  BMP again within a week.    Hennie Duos, MD

## 2014-01-11 ENCOUNTER — Non-Acute Institutional Stay (SKILLED_NURSING_FACILITY): Payer: Medicare Other | Admitting: Nurse Practitioner

## 2014-01-11 DIAGNOSIS — R1084 Generalized abdominal pain: Secondary | ICD-10-CM

## 2014-01-11 DIAGNOSIS — K219 Gastro-esophageal reflux disease without esophagitis: Secondary | ICD-10-CM

## 2014-01-11 DIAGNOSIS — K5909 Other constipation: Secondary | ICD-10-CM

## 2014-01-11 DIAGNOSIS — I5042 Chronic combined systolic (congestive) and diastolic (congestive) heart failure: Secondary | ICD-10-CM

## 2014-01-11 DIAGNOSIS — I10 Essential (primary) hypertension: Secondary | ICD-10-CM

## 2014-01-11 NOTE — Progress Notes (Signed)
Patient ID: [name], female   DOB: Dec 02, 1934, 79 y.o.   MRN: [id number]    Nursing Home Location:  [facility]   Place of Service: SNF (51)  PCP: Osborne Casco, MD  Allergies  Allergen Reactions  . Levaquin [Levofloxacin] Other (See Comments)    Per MAR  . Penicillins Other (See Comments)    Per MAR  . Tetanus-Diphtheria Toxoids Td Other (See Comments)    Per MAR  . Latex Hives and Rash    Chief Complaint  Patient presents with  . Medical Management of Chronic Issues    HPI:  Patient is a 79 y.o. female seen today at [facility] for routine follow up. ot with pmh of CHF, OA, CKD, Chronic respiratory failure with hypoxia, and HTN. Pt with worsening labs over the last month, Dr Sheppard Coil evaluated and decreased Demadex to 2 tablet twice daily. Lab work scheduled for this week to follow up. Pt without new complaints today. Reports some abdominal discomfort but this is unchanged from past. Bowels moving appropriately. No worsening shortness of breath or weight gain noted.   Review of Systems:  Review of Systems  Constitutional: Negative for activity change, appetite change, fatigue and unexpected weight change.  HENT: Negative for congestion and hearing loss.   Eyes: Negative.   Respiratory: Negative for cough and shortness of breath.        Chronic shortness of breath, this has remained unchanged  Cardiovascular: Negative for chest pain, palpitations and leg swelling.  Gastrointestinal: Negative for abdominal pain, diarrhea and constipation.  Genitourinary: Negative for dysuria and difficulty urinating.  Musculoskeletal: Positive for arthralgias. Negative for myalgias.  Skin: Negative for color change and wound.  Neurological: Negative for dizziness and weakness.  Psychiatric/Behavioral: Negative for behavioral problems, confusion and agitation.    Past Medical History  Diagnosis Date  . HTN (hypertension)   . Diverticulosis     . Hemorrhoid   . Osteoarthritis   . Constipation, chronic   . H/O: hysterectomy   . Systolic and diastolic CHF, chronic     a. Echo 07/2011: Technically limited; inferior HK, inferolateral and possibly lateral wall HK, EF 40%, moderate LVE, aortic sclerosis without stenosis, mild MR, mild LAE, question RV dysfunction;   b.  Echo (04/2013): EF 50-55%, normal wall motion, moderate MR, mild to moderate LAE, PASP 37  . Female bladder prolapse     with prolapse uterus, cystocele, s/p TVH/BSO 12/2006  . Obesity (BMI 30-39.9)     wt. 94.1 kg 12/2010  . Moderate mitral regurgitation     a. Mod to Sev by echo 01/2009;  b. mild MR by echo 2013  . Atrial fibrillation 10/13/2011    coumadin Rx  . Iron deficiency anemia    Past Surgical History  Procedure Laterality Date  . Wrist surgery    . Vaginal hysterectomy,cystocele and prolapse  repair  NOV. 2008  . Abdominal hysterectomy    . Colonoscopy N/A 07/18/2013    Procedure: COLONOSCOPY;  Surgeon: Wonda Horner, MD;  Location: [facility];  Service: Endoscopy;  Laterality: N/A;   Social History:   reports that she has never smoked. Her smokeless tobacco use includes Snuff. She reports that she does not drink alcohol or use illicit drugs.  Family History  Problem Relation Age of Onset  . Diabetes Brother   . Hypertension Mother     deceased @ 71  . Leukemia Brother   . Cancer Brother   .  Stroke Mother   . Diabetes Father     deceased in his 34's    Medications: Patient's Medications  New Prescriptions   No medications on file  Previous Medications   ALBUTEROL (PROVENTIL) (2.5 MG/3ML) 0.083% NEBULIZER SOLUTION    Take 2.5 mg by nebulization every 8 (eight) hours as needed for wheezing or shortness of breath (For wheezing and shortness of breath).    AMINO ACIDS-PROTEIN HYDROLYS (FEEDING SUPPLEMENT, PRO-STAT SUGAR FREE 64,) LIQD    Take 30 mLs by mouth daily.    BISACODYL (DULCOLAX) 10 MG SUPPOSITORY    Place 10 mg rectally daily as  needed for moderate constipation (if constipation not relieved by MOM).   CARVEDILOL (COREG) 3.125 MG TABLET    Take 3.125 mg by mouth 2 (two) times daily with a meal. Hold if SBP <100   ESCITALOPRAM (LEXAPRO) 5 MG TABLET    Take 5 mg by mouth daily. For Depression/anxiety   FERROUS FUMARATE (FERRETTS) 325 (106 FE) MG TABS    Take 1 tablet by mouth 2 (two) times daily. 8am and 4pm for Iron Deficiency   HYDROCODONE-ACETAMINOPHEN (NORCO/VICODIN) 5-325 MG PER TABLET    Take 1 tablet by mouth every 8 (eight) hours as needed (pain).   LISINOPRIL (PRINIVIL,ZESTRIL) 2.5 MG TABLET    Take 2.5 mg by mouth daily. For hypertension   LUBIPROSTONE (AMITIZA) 24 MCG CAPSULE    Take 1 capsule (24 mcg total) by mouth 2 (two) times daily with a meal.   MAGNESIUM HYDROXIDE (MILK OF MAGNESIA) 400 MG/5ML SUSPENSION    Take 30 mLs by mouth daily as needed for mild constipation.   METOLAZONE (ZAROXOLYN) 2.5 MG TABLET    Take 1 tablet (2.5 mg total) by mouth every Wednesday.   PANTOPRAZOLE (PROTONIX) 40 MG TABLET    Take 1 tablet (40 mg total) by mouth 2 (two) times daily.   POLYETHYLENE GLYCOL (MIRALAX / GLYCOLAX) PACKET    Take 17 g by mouth daily.   POTASSIUM CHLORIDE SA (K-DUR,KLOR-CON) 20 MEQ TABLET    Take 20 mEq by mouth 2 (two) times daily. Give extra 40 mg lasix and potassium 20 meq by mouth if resident weight increase by 3 lbs in 24 hours   PREDNISONE (DELTASONE) 20 MG TABLET    2 by mouth at breakfast for arthritis   PROMETHAZINE (PHENERGAN) 25 MG TABLET    Take 25 mg by mouth every 6 (six) hours as needed for nausea or vomiting.   SODIUM PHOSPHATES (RA SALINE ENEMA RE)    Place 1 Package rectally daily as needed (if constipation not relieved by dulcolax suppository after MOM).   TORSEMIDE (DEMADEX) 20 MG TABLET    Take 3 by mouth twice daily for acute or chronic combined systolic and diastolic heart failure   TRAMADOL (ULTRAM) 50 MG TABLET    Take one tablet by mouth every 8 hours as needed for moderate pain    VITAMIN D, ERGOCALCIFEROL, (DRISDOL) 50000 UNITS CAPS CAPSULE    Take 50,000 Units by mouth every 7 (seven) days. On Sundays, for low vit D  Modified Medications   No medications on file  Discontinued Medications   No medications on file     Physical Exam: Filed Vitals:   01/11/14 1351  BP: 122/78  Pulse: 64  Temp: 98 F (36.7 C)  Resp: 20  Weight: 240 lb (108.863 kg)    Physical Exam  Constitutional: She appears well-developed and well-nourished. No distress.  HENT:  Head: Normocephalic and atraumatic.  Mouth/Throat: Oropharynx is clear and moist. No oropharyngeal exudate.  Eyes: Conjunctivae are normal. Pupils are equal, round, and reactive to light.  Neck: Normal range of motion. Neck supple.  Cardiovascular: Normal rate and regular rhythm.   Murmur heard. Pulmonary/Chest: Effort normal and breath sounds normal.  Abdominal: Soft. Bowel sounds are normal.  Musculoskeletal: She exhibits edema (trace to bilateral LE). She exhibits no tenderness.  Neurological: She is alert.  Skin: Skin is warm and dry. She is not diaphoretic.  Psychiatric: She has a normal mood and affect.    Labs reviewed: Basic Metabolic Panel:  Recent Labs  06/03/13 1825  06/05/13 1200 06/06/13 0233  08/10/13 0417 08/15/13 1044 11/28/13 1110  NA  --   < >  --  129*  < > 131* 135* 142  K  --   < > 4.1 3.8  < > 3.2* 4.2 3.8  CL  --   < >  --  96  < > 82* 93* 98  CO2  --   < >  --  22  < > 35* 26 28  GLUCOSE  --   < >  --  90  < > 87 103* 137*  BUN  --   < >  --  29*  < > 34* 52* 26*  CREATININE  --   < >  --  1.45*  < > 1.21* 1.11* 1.06  CALCIUM  --   < >  --  7.4*  < > 8.9 8.8 8.9  MG 2.6*  --  2.0 1.9  --   --   --   --   PHOS 3.2  --   --   --   --   --   --   --   < > = values in this interval not displayed. Liver Function Tests:  Recent Labs  07/13/13 1325 07/14/13 0541 08/05/13 0800  AST 11 8 33  ALT 10 9 86*  ALKPHOS 88 79 120*  BILITOT 0.2* 0.3 0.3  PROT 6.2 5.0* 6.0    ALBUMIN 3.0* 2.4* 2.7*    Recent Labs  06/03/13 1135 07/13/13 1325  LIPASE 9* 13    Recent Labs  06/04/13 1120  AMMONIA 65*   CBC:  Recent Labs  07/13/13 1325  08/04/13 0322  08/04/13 0811 08/05/13 0800 08/09/13 0357  WBC 6.2  < > 19.9*  --  13.2* 10.0 10.7*  NEUTROABS 4.1  --  18.1*  --   --  8.2*  --   HGB 8.7*  < > 10.1*  < > 10.0* 10.2* 10.4*  HCT 27.8*  < > 32.5*  < > 31.6* 32.5* 33.4*  MCV 98.6  < > 97.6  --  98.1 97.0 96.0  PLT 259  < > 308  --  277 280 280  < > = values in this interval not displayed. TSH:  Recent Labs  03/14/13 1208 06/03/13 1825 08/04/13 0811  TSH 1.48 2.070 2.500   A1C: Lab Results  Component Value Date   HGBA1C 5.6 06/03/2013   Lipid Panel: No results for input(s): CHOL, HDL, LDLCALC, TRIG, CHOLHDL, LDLDIRECT in the last 8760 hours.   Sodium 128   L 135-145 mEq/L SLN   Potassium 3.4   L 3.5-5.3 mEq/L SLN   Chloride 88   L 96-112 mEq/L SLN   CO2 31     19-32 mEq/L SLN   Glucose 99     70-99 mg/dL SLN   BUN  85   H 6-23 mg/dL SLN   Creatinine 1.64   H 0.50-1.10 mg/dL SLN   Calcium 8.7     8.4-10.5 mg/dL SLN    Assessment/Plan  1. Chronic combined systolic and diastolic CHF, NYHA class 4 -worsening kidney function and low sodium on recent lab, diuretics decreased and follow up labs scheduled. No worsening shortness of breath or edema. Will cont to follow at this time.   2. Essential hypertension Blood pressure under good controlled with current regimen  3. CONSTIPATION, CHRONIC stable  4. Gastroesophageal reflux disease without esophagitis stable  5. Generalized abdominal pain Ongoing no changes, uses PRN with good effects  6. Depression Daughter would like lexapro stopped. Pt denies any depression or anxiety at this time, will DC and cont to follow mood

## 2014-01-13 ENCOUNTER — Encounter: Payer: Self-pay | Admitting: Internal Medicine

## 2014-01-13 NOTE — Assessment & Plan Note (Signed)
Pt has required demadex instead of laix and a large dose to get her to a dry weight. This vigorous diuresis has lead to the inevitable, a BUN 85, Cr 1.64. Plan to hold 2 doses then decrease demadex from 60 mg BID to 40 mg BID and recheck  BMP again within a week.

## 2014-01-14 ENCOUNTER — Encounter: Payer: Self-pay | Admitting: Internal Medicine

## 2014-01-25 ENCOUNTER — Non-Acute Institutional Stay (SKILLED_NURSING_FACILITY): Payer: Medicare Other | Admitting: Nurse Practitioner

## 2014-01-25 DIAGNOSIS — I5042 Chronic combined systolic (congestive) and diastolic (congestive) heart failure: Secondary | ICD-10-CM

## 2014-01-25 DIAGNOSIS — N179 Acute kidney failure, unspecified: Secondary | ICD-10-CM

## 2014-01-25 DIAGNOSIS — J189 Pneumonia, unspecified organism: Secondary | ICD-10-CM

## 2014-01-25 DIAGNOSIS — I10 Essential (primary) hypertension: Secondary | ICD-10-CM

## 2014-01-25 NOTE — Progress Notes (Signed)
Patient ID: [name], female   DOB: [birth year], 79 y.o.   MRN: [id number]    Nursing Home Location:  [facility]   Place of Service: SNF (63)  PCP: Osborne Casco, MD  Allergies  Allergen Reactions  . Levaquin [Levofloxacin] Other (See Comments)    Per MAR  . Penicillins Other (See Comments)    Per MAR  . Tetanus-Diphtheria Toxoids Td Other (See Comments)    Per MAR  . Latex Hives and Rash    Chief Complaint  Patient presents with  . Acute Visit    HPI:  Patient is a 79 y.o. female seen today at [facility] for acute visit to follow up pneumonia, ARF and hypotension. Pts has had decrease appetite, more lethargic with poor PO intake over the last few days. Labs obtained which revealed ARF and xray showed pneumonia. Dr Sheppard Coil was called and started pt on Rocephin IM. Held all diuretics and gave NS via clysis. Pt was also found to be hypotensive and therefore blood pressure medication has been held. Today sbp remains in the 80s. Pt remains with poor PO intake. Denies worsening shortness of breath or congestion. No fever noted but pt very clammy.  KUB also obtained due to ongoing abdominal complaints which was negative, pt also having regular BMs at this time.   Review of Systems:  Review of Systems  Constitutional: Positive for diaphoresis and fatigue. Negative for activity change, appetite change and unexpected weight change.  HENT: Negative for congestion and hearing loss.   Eyes: Negative.   Respiratory: Negative for cough and shortness of breath.        Chronic shortness of breath, this has remained unchanged  Cardiovascular: Negative for chest pain, palpitations and leg swelling.  Gastrointestinal: Positive for abdominal pain. Negative for diarrhea and constipation.  Genitourinary: Negative for dysuria and difficulty urinating.  Musculoskeletal: Positive for arthralgias. Negative for myalgias.  Skin: Negative for color change and  wound.  Neurological: Negative for dizziness and weakness.  Psychiatric/Behavioral: Positive for confusion. Negative for behavioral problems and agitation.    Past Medical History  Diagnosis Date  . HTN (hypertension)   . Diverticulosis   . Hemorrhoid   . Osteoarthritis   . Constipation, chronic   . H/O: hysterectomy   . Systolic and diastolic CHF, chronic     a. Echo 07/2011: Technically limited; inferior HK, inferolateral and possibly lateral wall HK, EF 40%, moderate LVE, aortic sclerosis without stenosis, mild MR, mild LAE, question RV dysfunction;   b.  Echo (04/2013): EF 50-55%, normal wall motion, moderate MR, mild to moderate LAE, PASP 37  . Female bladder prolapse     with prolapse uterus, cystocele, s/p TVH/BSO 12/2006  . Obesity (BMI 30-39.9)     wt. 94.1 kg 12/2010  . Moderate mitral regurgitation     a. Mod to Sev by echo 01/2009;  b. mild MR by echo 2013  . Atrial fibrillation 10/13/2011    coumadin Rx  . Iron deficiency anemia    Past Surgical History  Procedure Laterality Date  . Wrist surgery    . Vaginal hysterectomy,cystocele and prolapse  repair  NOV. 2008  . Abdominal hysterectomy    . Colonoscopy N/A 07/18/2013    Procedure: COLONOSCOPY;  Surgeon: Wonda Horner, MD;  Location: [facility];  Service: Endoscopy;  Laterality: N/A;   Social History:   reports that she has never smoked. Her smokeless tobacco use includes Snuff. She reports that she  does not drink alcohol or use illicit drugs.  Family History  Problem Relation Age of Onset  . Diabetes Brother   . Hypertension Mother     deceased @ 35  . Leukemia Brother   . Cancer Brother   . Stroke Mother   . Diabetes Father     deceased in his 103's    Medications: Patient's Medications  New Prescriptions   No medications on file  Previous Medications   ALBUTEROL (PROVENTIL) (2.5 MG/3ML) 0.083% NEBULIZER SOLUTION    Take 2.5 mg by nebulization every 8 (eight) hours as needed for wheezing or shortness  of breath (For wheezing and shortness of breath).    AMINO ACIDS-PROTEIN HYDROLYS (FEEDING SUPPLEMENT, PRO-STAT SUGAR FREE 64,) LIQD    Take 30 mLs by mouth daily.    BISACODYL (DULCOLAX) 10 MG SUPPOSITORY    Place 10 mg rectally daily as needed for moderate constipation (if constipation not relieved by MOM).   CARVEDILOL (COREG) 3.125 MG TABLET    Take 3.125 mg by mouth 2 (two) times daily with a meal. Hold if SBP <100   ESCITALOPRAM (LEXAPRO) 5 MG TABLET    Take 5 mg by mouth daily. For Depression/anxiety   FERROUS FUMARATE (FERRETTS) 325 (106 FE) MG TABS    Take 1 tablet by mouth 2 (two) times daily. 8am and 4pm for Iron Deficiency   HYDROCODONE-ACETAMINOPHEN (NORCO/VICODIN) 5-325 MG PER TABLET    Take 1 tablet by mouth every 8 (eight) hours as needed (pain).   LISINOPRIL (PRINIVIL,ZESTRIL) 2.5 MG TABLET    Take 2.5 mg by mouth daily. For hypertension   LUBIPROSTONE (AMITIZA) 24 MCG CAPSULE    Take 1 capsule (24 mcg total) by mouth 2 (two) times daily with a meal.   MAGNESIUM HYDROXIDE (MILK OF MAGNESIA) 400 MG/5ML SUSPENSION    Take 30 mLs by mouth daily as needed for mild constipation.   METOLAZONE (ZAROXOLYN) 2.5 MG TABLET    Take 1 tablet (2.5 mg total) by mouth every Wednesday.   PANTOPRAZOLE (PROTONIX) 40 MG TABLET    Take 1 tablet (40 mg total) by mouth 2 (two) times daily.   POLYETHYLENE GLYCOL (MIRALAX / GLYCOLAX) PACKET    Take 17 g by mouth daily.   POTASSIUM CHLORIDE SA (K-DUR,KLOR-CON) 20 MEQ TABLET    Take 20 mEq by mouth 2 (two) times daily. Give extra 40 mg lasix and potassium 20 meq by mouth if resident weight increase by 3 lbs in 24 hours   PREDNISONE (DELTASONE) 20 MG TABLET    2 by mouth at breakfast for arthritis   PROMETHAZINE (PHENERGAN) 25 MG TABLET    Take 25 mg by mouth every 6 (six) hours as needed for nausea or vomiting.   SODIUM PHOSPHATES (RA SALINE ENEMA RE)    Place 1 Package rectally daily as needed (if constipation not relieved by dulcolax suppository after MOM).     TORSEMIDE (DEMADEX) 20 MG TABLET    Take 3 by mouth twice daily for acute or chronic combined systolic and diastolic heart failure   TRAMADOL (ULTRAM) 50 MG TABLET    Take one tablet by mouth every 8 hours as needed for moderate pain   VITAMIN D, ERGOCALCIFEROL, (DRISDOL) 50000 UNITS CAPS CAPSULE    Take 50,000 Units by mouth every 7 (seven) days. On Sundays, for low vit D  Modified Medications   No medications on file  Discontinued Medications   No medications on file     Physical Exam: Filed Vitals:  01/25/14 1706  BP: 80/52  Pulse: 98  Temp: 99.9 F (37.7 C)  Resp: 20  SpO2: 97%    Physical Exam  Constitutional: She appears well-developed and well-nourished. No distress.  HENT:  Head: Normocephalic and atraumatic.  Dry mucous membranes  Eyes: Conjunctivae are normal. Pupils are equal, round, and reactive to light.  Neck: Normal range of motion. Neck supple.  Cardiovascular: Normal rate and regular rhythm.   Murmur heard. Pulmonary/Chest: Effort normal. She has decreased breath sounds.  Abdominal: Soft. Bowel sounds are normal.  Musculoskeletal: She exhibits no edema (no edema) or tenderness.  Neurological: She is alert.  Skin: Skin is warm and dry. She is not diaphoretic.  Psychiatric: She has a normal mood and affect. Cognition and memory are impaired.    Labs reviewed: Basic Metabolic Panel:  Recent Labs  06/03/13 1825  06/05/13 1200 06/06/13 0233  08/10/13 0417 08/15/13 1044 11/28/13 1110  NA  --   < >  --  129*  < > 131* 135* 142  K  --   < > 4.1 3.8  < > 3.2* 4.2 3.8  CL  --   < >  --  96  < > 82* 93* 98  CO2  --   < >  --  22  < > 35* 26 28  GLUCOSE  --   < >  --  90  < > 87 103* 137*  BUN  --   < >  --  29*  < > 34* 52* 26*  CREATININE  --   < >  --  1.45*  < > 1.21* 1.11* 1.06  CALCIUM  --   < >  --  7.4*  < > 8.9 8.8 8.9  MG 2.6*  --  2.0 1.9  --   --   --   --   PHOS 3.2  --   --   --   --   --   --   --   < > = values in this interval not  displayed. Liver Function Tests:  Recent Labs  07/13/13 1325 07/14/13 0541 08/05/13 0800  AST 11 8 33  ALT 10 9 86*  ALKPHOS 88 79 120*  BILITOT 0.2* 0.3 0.3  PROT 6.2 5.0* 6.0  ALBUMIN 3.0* 2.4* 2.7*    Recent Labs  06/03/13 1135 07/13/13 1325  LIPASE 9* 13    Recent Labs  06/04/13 1120  AMMONIA 65*   CBC:  Recent Labs  07/13/13 1325  08/04/13 0322  08/04/13 0811 08/05/13 0800 08/09/13 0357  WBC 6.2  < > 19.9*  --  13.2* 10.0 10.7*  NEUTROABS 4.1  --  18.1*  --   --  8.2*  --   HGB 8.7*  < > 10.1*  < > 10.0* 10.2* 10.4*  HCT 27.8*  < > 32.5*  < > 31.6* 32.5* 33.4*  MCV 98.6  < > 97.6  --  98.1 97.0 96.0  PLT 259  < > 308  --  277 280 280  < > = values in this interval not displayed. TSH:  Recent Labs  03/14/13 1208 06/03/13 1825 08/04/13 0811  TSH 1.48 2.070 2.500   A1C: Lab Results  Component Value Date   HGBA1C 5.6 06/03/2013   Lipid Panel: No results for input(s): CHOL, HDL, LDLCALC, TRIG, CHOLHDL, LDLDIRECT in the last 8760 hours. BC NO Diff (Complete Blood Count)    Result: 12/18/2013 6:13 PM   ( Status:  F )       WBC 10.9   H 4.0-10.5 K/uL SLN   RBC 3.51   L 3.87-5.11 MIL/uL SLN   Hemoglobin 11.1   L 12.0-15.0 g/dL SLN   Hematocrit 32.4   L 36.0-46.0 % SLN   MCV 92.3     78.0-100.0 fL SLN   MCH 31.6     26.0-34.0 pg SLN   MCHC 34.3     30.0-36.0 g/dL SLN   RDW 14.9     11.5-15.5 % SLN   Platelet Count 235     150-400 K/uL SLN   B Natriuretic Peptide    Result: 12/19/2013 10:57 AM   ( Status: F )       B Natriuretic Peptide 75.3     0.0-100.0 pg/mL SLNBasic Metabolic Panel    Result: 01/01/2014 3:13 PM   ( Status: F )     C Sodium 128   L 135-145 mEq/L SLN   Potassium 3.4   L 3.5-5.3 mEq/L SLN   Chloride 88   L 96-112 mEq/L SLN   CO2 31     19-32 mEq/L SLN   Glucose 99     70-99 mg/dL SLN   BUN 85   H 6-23 mg/dL SLN   Creatinine 1.64   H 0.50-1.10 mg/dL SLN   Calcium 8.7     Basic Metabolic Panel    Result: 01/15/2014 3:41 PM     ( Status: F )     C Sodium 133   L 135-145 mEq/L SLN   Potassium 3.8     3.5-5.3 mEq/L SLN   Chloride 91   L 96-112 mEq/L SLN   CO2 29     19-32 mEq/L SLN   Glucose 118   H 70-99 mg/dL SLN   BUN 86   H 6-23 mg/dL SLN   Creatinine 1.66   H 0.50-1.10 mg/dL SLN   Calcium 8.5     8.4-10.5 mg/dL SLN CBC with Diff    Result: 01/25/2014 12:00 AM   ( Status: F )     C WBC 9.5     4.0-10.5 K/uL SLN   RBC 3.67   L 3.87-5.11 MIL/uL SLN   Hemoglobin 11.8   L 12.0-15.0 g/dL SLN   Hematocrit 34.7   L 36.0-46.0 % SLN   MCV 94.6     78.0-100.0 fL SLN   MCH 32.2     26.0-34.0 pg SLN   MCHC 34.0     30.0-36.0 g/dL SLN   RDW 15.4     11.5-15.5 % SLN   Platelet Count 233     150-400 K/uL SLN   MPV 10.3     9.4-12.4 fL SLN   Granulocyte % 84   H 43-77 % SLN   Absolute Gran 8.0   H 1.7-7.7 K/uL SLN   Lymph % 8   L 12-46 % SLN   Absolute Lymph 0.8     0.7-4.0 K/uL SLN   Mono % 7     3-12 % SLN   Absolute Mono 0.7     0.1-1.0 K/uL SLN   Eos % 1     0-5 % SLN   Absolute Eos 0.1     0.0-0.7 K/uL SLN   Baso % 0     0-1 % SLN   Absolute Baso 0.0     0.0-0.1 K/uL SLN   Smear Review   SLN C Basic Metabolic Panel    Result: 01/24/2014 4:30  PM   ( Status: F )       Sodium 138     135-145 mEq/L SLN   Potassium 3.5     3.5-5.3 mEq/L SLN   Chloride 96     96-112 mEq/L SLN   CO2 28     19-32 mEq/L SLN   Glucose 116   H 70-99 mg/dL SLN   BUN 69   H 6-23 mg/dL SLN   Creatinine 3.25   H 0.50-1.10 mg/dL SLN   Calcium 8.4     8.4-10.5 mg/dL SLN   -- END OF REPORT --  Assessment/Plan 1. HCAP (healthcare-associated pneumonia) Currently being treated with IM rocephin for a total of 7 days.   2. Acute renal failure, unspecified acute renal failure type -pt with decrease PO intake, currently off all diuretics since yesterday due to Cr of 3.5. Will dc potassium at this time Pt also had clysis ordered and received 1 L of NS. Pt is still very dry, 2L NS via clysis ordered at 75 cc an hour. Repeat BMP at this  time  3. Chronic combined systolic and diastolic CHF, NYHA class 4 Volume status has been very difficult to control, needing Demadex to get proper control of fluids. Now not eating or drinking which has lead to ARF with HCAP. Will cont to hold all diuretics and potassium replacement. Will give fluid today and follow up BMP in AM. Will need close monitoring of fluid status/weights/Cr to prevent rehospitalization due to acute CHF.   4. Hypertension Blood pressure now low, will DC ACE at this time due to ARF and hypotension, hold parameters in place for Coreg.   Labs/tests ordered cbc, bmp, bnp Discussed pts order, assessment and plan in detail with Dr Sheppard Coil who agrees with current plan of care and will cont to follow up with pt

## 2014-01-29 ENCOUNTER — Telehealth: Payer: Self-pay | Admitting: Cardiology

## 2014-01-29 NOTE — Telephone Encounter (Signed)
[name] called in stating that the pt has pneumonia and has had a low BP at 70/54 and she would like for the pt to be seen as soon as possible. Pt is also not retaining much fluid. Please call  Thanks

## 2014-01-29 NOTE — Telephone Encounter (Signed)
Pt. Added on 12/23 sched. To see Dr. Percival Spanish

## 2014-01-30 ENCOUNTER — Ambulatory Visit: Payer: Medicare Other | Admitting: Cardiology

## 2014-02-02 DIAGNOSIS — S82899A Other fracture of unspecified lower leg, initial encounter for closed fracture: Secondary | ICD-10-CM

## 2014-02-02 HISTORY — DX: Other fracture of unspecified lower leg, initial encounter for closed fracture: S82.899A

## 2014-02-04 ENCOUNTER — Non-Acute Institutional Stay (SKILLED_NURSING_FACILITY): Payer: Medicare Other | Admitting: Internal Medicine

## 2014-02-04 DIAGNOSIS — I5042 Chronic combined systolic (congestive) and diastolic (congestive) heart failure: Secondary | ICD-10-CM

## 2014-02-04 DIAGNOSIS — J189 Pneumonia, unspecified organism: Secondary | ICD-10-CM

## 2014-02-04 DIAGNOSIS — M79605 Pain in left leg: Secondary | ICD-10-CM

## 2014-02-04 NOTE — Progress Notes (Signed)
MRN: [id number] Name: [name]  Sex: female Age: 79 y.o. DOB: Mar 11, 1934  [place] #: [name] Facility/Room:305 Level Of Care: SNF Provider: Inocencio Homes D Emergency Contacts: Extended Emergency Contact Information Primary Emergency Contact: Comley,Reggie Address: [place], [place] [address] [place] Phone: [phone] Work Phone: [phone] Relation: Son Secondary Emergency Contact: Ruiter,[name]          [place], [place] Phone: [phone] Relation: Daughter  Code Status: FULL  Allergies: Levaquin; Penicillins; Tetanus-diphtheria toxoids td; and Latex  Chief Complaint  Patient presents with  . Acute Visit    HPI: Patient is 79 y.o. female who has been followed closely recently for PNA with hypotension treated at SNF at [facility] with excellent result. Pt fell from standing and no reported injury at the time. However, today pt's daughter is here and pt is c/o L leg pain.   Past Medical History  Diagnosis Date  . HTN (hypertension)   . Diverticulosis   . Hemorrhoid   . Osteoarthritis   . Constipation, chronic   . H/O: hysterectomy   . Systolic and diastolic CHF, chronic     a. Echo 07/2011: Technically limited; inferior HK, inferolateral and possibly lateral wall HK, EF 40%, moderate LVE, aortic sclerosis without stenosis, mild MR, mild LAE, question RV dysfunction;   b.  Echo (04/2013): EF 50-55%, normal wall motion, moderate MR, mild to moderate LAE, PASP 37  . Female bladder prolapse     with prolapse uterus, cystocele, s/p TVH/BSO 12/2006  . Obesity (BMI 30-39.9)     wt. 94.1 kg 12/2010  . Moderate mitral regurgitation     a. Mod to Sev by echo 01/2009;  b. mild MR by echo 2013  . Atrial fibrillation 10/13/2011    coumadin Rx  . Iron deficiency anemia     Past Surgical History  Procedure Laterality Date  . Wrist surgery    . Vaginal hysterectomy,cystocele and prolapse  repair  NOV. 2008   . Abdominal hysterectomy    . Colonoscopy N/A 07/18/2013    Procedure: COLONOSCOPY;  Surgeon: Wonda Horner, MD;  Location: [facility];  Service: Endoscopy;  Laterality: N/A;      Medication List       This list is accurate as of: 02/04/14 11:59 PM.  Always use your most recent med list.               albuterol (2.5 MG/3ML) 0.083% nebulizer solution  Commonly known as:  PROVENTIL  Take 2.5 mg by nebulization every 8 (eight) hours as needed for wheezing or shortness of breath (For wheezing and shortness of breath).     bisacodyl 10 MG suppository  Commonly known as:  DULCOLAX  Place 10 mg rectally daily as needed for moderate constipation (if constipation not relieved by MOM).     carvedilol 3.125 MG tablet  Commonly known as:  COREG  Take 3.125 mg by mouth 2 (two) times daily with a meal. Hold if SBP <100     escitalopram 5 MG tablet  Commonly known as:  LEXAPRO  Take 5 mg by mouth daily. For Depression/anxiety     feeding supplement (PRO-STAT SUGAR FREE 64) Liqd  Take 30 mLs by mouth daily.     FERRETTS 325 (106 FE) MG Tabs tablet  Generic drug:  ferrous fumarate  Take 1 tablet by mouth 2 (two) times daily. 8am and 4pm for  Iron Deficiency     HYDROcodone-acetaminophen 5-325 MG per tablet  Commonly known as:  NORCO/VICODIN  Take 1 tablet by mouth every 8 (eight) hours as needed (pain).     lisinopril 2.5 MG tablet  Commonly known as:  PRINIVIL,ZESTRIL  Take 2.5 mg by mouth daily. For hypertension     lubiprostone 24 MCG capsule  Commonly known as:  AMITIZA  Take 1 capsule (24 mcg total) by mouth 2 (two) times daily with a meal.     magnesium hydroxide 400 MG/5ML suspension  Commonly known as:  MILK OF MAGNESIA  Take 30 mLs by mouth daily as needed for mild constipation.     metolazone 2.5 MG tablet  Commonly known as:  ZAROXOLYN  Take 1 tablet (2.5 mg total) by mouth every Wednesday.     pantoprazole 40 MG tablet  Commonly known as:  PROTONIX  Take 1  tablet (40 mg total) by mouth 2 (two) times daily.     polyethylene glycol packet  Commonly known as:  MIRALAX / GLYCOLAX  Take 17 g by mouth daily.     potassium chloride SA 20 MEQ tablet  Commonly known as:  K-DUR,KLOR-CON  Take 20 mEq by mouth 2 (two) times daily. Give extra 40 mg lasix and potassium 20 meq by mouth if resident weight increase by 3 lbs in 24 hours     predniSONE 20 MG tablet  Commonly known as:  DELTASONE  2 by mouth at breakfast for arthritis     promethazine 25 MG tablet  Commonly known as:  PHENERGAN  Take 25 mg by mouth every 6 (six) hours as needed for nausea or vomiting.     RA SALINE ENEMA RE  Place 1 Package rectally daily as needed (if constipation not relieved by dulcolax suppository after MOM).     torsemide 20 MG tablet  Commonly known as:  DEMADEX  Take 3 by mouth twice daily for acute or chronic combined systolic and diastolic heart failure     traMADol 50 MG tablet  Commonly known as:  ULTRAM  Take one tablet by mouth every 8 hours as needed for moderate pain     Vitamin D (Ergocalciferol) 50000 UNITS Caps capsule  Commonly known as:  DRISDOL  Take 50,000 Units by mouth every 7 (seven) days. On Sundays, for low vit D        No orders of the defined types were placed in this encounter.     There is no immunization history on file for this patient.  History  Substance Use Topics  . Smoking status: Never Smoker   . Smokeless tobacco: Current User    Types: Snuff     Comment: Uses Snuff regularly.  Dips several x/day.  A "large can" can last her up to 3 mos.  . Alcohol Use: No    Review of Systems  DATA OBTAINED: from patient, nurse, family member GENERAL:  no fevers, fatigue, appetite changes SKIN: No itching, rash HEENT: No complaint RESPIRATORY: No cough, wheezing, SOB, much improved CARDIAC: No chest pain, palpitations, GI: No abdominal pain, No N/V/D or constipation, No heartburn or reflux  GU: No dysuria, frequency or  urgency, or incontinence  MUSCULOSKELETAL: L leg pain , entire leg, unable to say if hip, knee or ankle hurts worse NEUROLOGIC: No headache, dizziness  PSYCHIATRIC: No overt anxiety or sadness  Filed Vitals:   02/04/14 2006  BP: 136/76  Pulse: 82  Temp: 97.9 F (36.6 C)  Resp: 20  Physical Exam  GENERAL APPEARANCE: Alert, conversant, Sitting up in WC, No acute distress  SKIN: No diaphoresis rash HEENT: Unremarkable RESPIRATORY: Breathing is even, unlabored. Lung sounds are clear   CARDIOVASCULAR: Heart RRR no murmurs, rubs or gallops. +peripheral edema but the least edema I have ever seen pt have GASTROINTESTINAL: Abdomen is soft, non-tender, not distended w/ normal bowel sounds.  GENITOURINARY: Bladder non tender, not distended  MUSCULOSKELETAL: No abnormal joints or musculature but pt cried out with feather light touch of fingertips to BLE NEUROLOGIC: Cranial nerves 2-12 grossly intact. Moves all extremities PSYCHIATRIC: pt acts differently when her daughter is in attendance  Patient Active Problem List   Diagnosis Date Noted  . Chronic kidney disease (CKD), stage II (mild) 10/18/2013  . Chronic respiratory failure 08/28/2013  . Chronic diastolic heart failure 33/82/5053  . Immobility 08/15/2013  . Acute respiratory failure 08/13/2013  . GERD (gastroesophageal reflux disease) 08/13/2013  . CHF (congestive heart failure) 08/04/2013  . Congestive heart failure, NYHA class 4 08/04/2013  . GI bleed 07/13/2013  . History of lower GI bleeding 07/11/2013  . AKI (acute kidney injury) 06/29/2013  . Acute lower GI bleeding 06/23/2013  . PNA (pneumonia) 06/19/2013  . Rash and nonspecific skin eruption 06/19/2013  . Chronic respiratory failure with hypoxia 06/18/2013  . Acute encephalopathy 06/07/2013  . Diastolic CHF 97/67/3419  . Pulmonary hypertension 06/05/2013  . Weakness 06/03/2013  . Encounter for therapeutic drug monitoring 04/10/2013  . Long term current use of  anticoagulant 10/26/2011  . Hypokalemia 10/15/2011  . Abdominal pain 10/15/2011  . Iron deficiency anemia 10/15/2011  . Bladder prolapse, female, acquired 10/15/2011  . Atrial fibrillation 10/15/2011  . Acute on chronic combined systolic and diastolic congestive heart failure 07/22/2011  . Facial edema 12/30/2010  . Leukopenia 12/30/2010  . Neutropenia 12/30/2010  . Chronic pain 12/25/2010  . Hematochezia 12/25/2010  . Dehydration 12/25/2010  . Thrombocytosis 12/25/2010  . Hyponatremia 12/25/2010  . Chronic combined systolic and diastolic CHF, NYHA class 4   . Female bladder prolapse   . Mitral regurgitation 06/04/2010  . HYPOPOTASSEMIA 09/24/2009  . COMBINED HEART FAILURE, CHRONIC 09/09/2009  . CARDIOMYOPATHY 03/04/2009  . ANASARCA 03/04/2009  . Essential hypertension 02/20/2009  . CONSTIPATION, CHRONIC 02/20/2009  . DIVERTICULOSIS 02/20/2009    CBC    Component Value Date/Time   WBC 10.7* 08/09/2013 0357   RBC 3.48* 08/09/2013 0357   RBC 1.95* 06/23/2013 1118   HGB 10.4* 08/09/2013 0357   HCT 33.4* 08/09/2013 0357   PLT 280 08/09/2013 0357   MCV 96.0 08/09/2013 0357   LYMPHSABS 1.3 08/05/2013 0800   MONOABS 0.4 08/05/2013 0800   EOSABS 0.1 08/05/2013 0800   BASOSABS 0.0 08/05/2013 0800    CMP     Component Value Date/Time   NA 142 11/28/2013 1110   K 3.8 11/28/2013 1110   CL 98 11/28/2013 1110   CO2 28 11/28/2013 1110   GLUCOSE 137* 11/28/2013 1110   BUN 26* 11/28/2013 1110   CREATININE 1.06 11/28/2013 1110   CALCIUM 8.9 11/28/2013 1110   PROT 6.0 08/05/2013 0800   ALBUMIN 2.7* 08/05/2013 0800   AST 33 08/05/2013 0800   ALT 86* 08/05/2013 0800   ALKPHOS 120* 08/05/2013 0800   BILITOT 0.3 08/05/2013 0800   GFRNONAA 49* 11/28/2013 1110   GFRAA 57* 11/28/2013 1110    Assessment and Plan  PNA (pneumonia) Resoved with Im Rocephin, O2, IVF;pt is doing very well  Chronic combined systolic and diastolic CHF, NYHA class  4 Pt's chronic LE edema is better  today than I have ever seen  Left leg pain- nurses reported pt had fallen with no apparent injury but pt's daughter is here today and pt c/o L leg pain; pt is unable to narrow down area of pain, as to hip, knee or ankle; knee and ankle without swelling bruising or abnormality and pt c/o pain both legs with light touch to both legs; pt sitting comfortably in WC but have ordered L hip film  Late entry - L hip film neg for fx or other  Hennie Duos, MD

## 2014-02-05 ENCOUNTER — Encounter: Payer: Self-pay | Admitting: Cardiology

## 2014-02-10 ENCOUNTER — Encounter: Payer: Self-pay | Admitting: Internal Medicine

## 2014-02-10 NOTE — Assessment & Plan Note (Signed)
Resoved with Im Rocephin, O2, IVF;pt is doing very well

## 2014-02-10 NOTE — Assessment & Plan Note (Signed)
Pt's chronic LE edema is better today than I have ever seen

## 2014-02-11 ENCOUNTER — Other Ambulatory Visit: Payer: Self-pay | Admitting: *Deleted

## 2014-02-11 MED ORDER — HYDROCODONE-ACETAMINOPHEN 5-325 MG PO TABS: ORAL_TABLET | ORAL | Status: DC

## 2014-02-11 NOTE — Telephone Encounter (Signed)
[facility] 

## 2014-02-19 ENCOUNTER — Other Ambulatory Visit: Payer: Self-pay | Admitting: Internal Medicine

## 2014-02-19 ENCOUNTER — Ambulatory Visit (HOSPITAL_COMMUNITY)
Admission: RE | Admit: 2014-02-19 | Discharge: 2014-02-19 | Disposition: A | Discharge status: Home / Self Care | Payer: Medicare Other | Source: Ambulatory Visit | Attending: Internal Medicine | Admitting: Internal Medicine

## 2014-02-19 DIAGNOSIS — W19XXXA Unspecified fall, initial encounter: Secondary | ICD-10-CM | POA: Insufficient documentation

## 2014-02-19 DIAGNOSIS — S82302A Unspecified fracture of lower end of left tibia, initial encounter for closed fracture: Secondary | ICD-10-CM | POA: Diagnosis not present

## 2014-02-19 DIAGNOSIS — M25552 Pain in left hip: Secondary | ICD-10-CM | POA: Insufficient documentation

## 2014-02-19 DIAGNOSIS — M8588 Other specified disorders of bone density and structure, other site: Secondary | ICD-10-CM | POA: Insufficient documentation

## 2014-02-19 DIAGNOSIS — M25572 Pain in left ankle and joints of left foot: Secondary | ICD-10-CM | POA: Diagnosis present

## 2014-02-19 DIAGNOSIS — M79605 Pain in left leg: Secondary | ICD-10-CM | POA: Diagnosis not present

## 2014-02-19 DIAGNOSIS — M25562 Pain in left knee: Secondary | ICD-10-CM | POA: Insufficient documentation

## 2014-02-19 DIAGNOSIS — S92192A Other fracture of left talus, initial encounter for closed fracture: Secondary | ICD-10-CM | POA: Diagnosis not present

## 2014-02-21 ENCOUNTER — Other Ambulatory Visit: Payer: Self-pay | Admitting: *Deleted

## 2014-02-21 MED ORDER — HYDROCODONE-ACETAMINOPHEN 5-325 MG PO TABS: ORAL_TABLET | ORAL | Status: DC

## 2014-02-21 NOTE — Telephone Encounter (Signed)
[facility] 

## 2014-02-22 ENCOUNTER — Ambulatory Visit (INDEPENDENT_AMBULATORY_CARE_PROVIDER_SITE_OTHER): Payer: Medicare Other | Admitting: Physician Assistant

## 2014-02-22 ENCOUNTER — Encounter: Payer: Self-pay | Admitting: Physician Assistant

## 2014-02-22 VITALS — BP 122/80 | HR 79

## 2014-02-22 DIAGNOSIS — I5042 Chronic combined systolic (congestive) and diastolic (congestive) heart failure: Secondary | ICD-10-CM

## 2014-02-22 DIAGNOSIS — I5032 Chronic diastolic (congestive) heart failure: Secondary | ICD-10-CM

## 2014-02-22 DIAGNOSIS — I482 Chronic atrial fibrillation, unspecified: Secondary | ICD-10-CM

## 2014-02-22 DIAGNOSIS — I10 Essential (primary) hypertension: Secondary | ICD-10-CM

## 2014-02-22 LAB — BASIC METABOLIC PANEL
BUN: 43 mg/dL — ABNORMAL HIGH (ref 6–23)
CO2: 25 mEq/L (ref 19–32)
CREATININE: 1.62 mg/dL — AB (ref 0.50–1.10)
Calcium: 8.7 mg/dL (ref 8.4–10.5)
Chloride: 93 mEq/L — ABNORMAL LOW (ref 96–112)
Glucose, Bld: 178 mg/dL — ABNORMAL HIGH (ref 70–99)
POTASSIUM: 4.6 meq/L (ref 3.5–5.3)
Sodium: 130 mEq/L — ABNORMAL LOW (ref 135–145)

## 2014-02-22 NOTE — Assessment & Plan Note (Signed)
B/P controlled 

## 2014-02-22 NOTE — Patient Instructions (Signed)
Your physician recommends that you continue on your current medications as directed. Please refer to the Current Medication list given to you today.  Lab Today: Bmet  Your physician recommends that you schedule a follow-up appointment in: 3 months with Dr.Hochrein

## 2014-02-22 NOTE — Assessment & Plan Note (Signed)
Rhythm is regular on exam and well controlled.  She is not anticoagulated.

## 2014-02-22 NOTE — Assessment & Plan Note (Signed)
She appears euvolemic.  No LEE or rales on exam and she does not complain of orthopnea.  She is here wearing O2 and her O2 sats are 99%.  Continue current lasix dosing.  Apparently she has been hypokalemic according to the daughter.  I will check a BMET today.

## 2014-02-22 NOTE — Progress Notes (Signed)
Patient ID: [name], female   DOB: [birth year], 80 y.o.   MRN: [id number]    Date:  02/22/2014   ID:  [name], DOB [birth year], MRN [id number]  PCP:  Osborne Casco, MD  Primary Cardiologist:  [facility]   Chief Complaint  Patient presents with  . LOW B/P     History of Present Illness: [name] is a 80 y.o. female nursing home resident with history of combined systolic and diastolic congestive heart failure, chronic respiratory failure on 2 liters [place] chronic atrial fibrillation not a candidate for long-term anticoagulation due to recent GI bleeding (May 2015) , with hospitalization for pneumonia in May 2015, and hypertension.   Admitted to [facility] 6/29 through 08/10/13 with respiratory failure and volume overload. Diuresed with IV lasix and transitioned to lasix 60 mg tiwce a day. Discharge weight was 232 pounds. She was discharged to [facility].   Her daughter is here with her and reports the patient fell at one point in December but just had and xray and found to have a broken left foot.  She presents for for evaluation with no specific cardiology complaint.  Her current weight is unknown because she can not stand.  She denies orthopnea, LEE, PND, SOB beyond baseline.   The patient currently denies nausea, vomiting, fever, chest pain, shortness of breath, orthopnea, dizziness, PND, cough, congestion, abdominal pain, hematochezia, melena  Wt Readings from Last 3 Encounters:  01/11/14 240 lb (108.863 kg)  12/17/13 240 lb (108.863 kg)  11/16/13 249 lb (112.946 kg)     Past Medical History  Diagnosis Date  . HTN (hypertension)   . Diverticulosis   . Hemorrhoid   . Osteoarthritis   . Constipation, chronic   . H/O: hysterectomy   . Systolic and diastolic CHF, chronic     a. Echo 07/2011: Technically limited; inferior HK, inferolateral and possibly lateral wall HK, EF 40%, moderate LVE, aortic sclerosis without stenosis, mild MR, mild LAE, question RV dysfunction;   b.   Echo (04/2013): EF 50-55%, normal wall motion, moderate MR, mild to moderate LAE, PASP 37  . Female bladder prolapse     with prolapse uterus, cystocele, s/p TVH/BSO 12/2006  . Obesity (BMI 30-39.9)     wt. 94.1 kg 12/2010  . Moderate mitral regurgitation     a. Mod to Sev by echo 01/2009;  b. mild MR by echo 2013  . Atrial fibrillation 10/13/2011    coumadin Rx  . Iron deficiency anemia     Current Outpatient Prescriptions  Medication Sig Dispense Refill  . albuterol (PROVENTIL) (2.5 MG/3ML) 0.083% nebulizer solution Take 2.5 mg by nebulization every 8 (eight) hours as needed for wheezing or shortness of breath (For wheezing and shortness of breath).     . Amino Acids-Protein Hydrolys (FEEDING SUPPLEMENT, PRO-STAT SUGAR FREE 64,) LIQD Take 30 mLs by mouth daily.     . bisacodyl (DULCOLAX) 10 MG suppository Place 10 mg rectally daily as needed for moderate constipation (if constipation not relieved by MOM).    . carvedilol (COREG) 3.125 MG tablet Take 3.125 mg by mouth 2 (two) times daily with a meal. Hold if SBP <100    . ferrous fumarate (FERRETTS) 325 (106 FE) MG TABS Take 1 tablet by mouth 2 (two) times daily. 8am and 4pm for Iron Deficiency    . furosemide (LASIX) 20 MG tablet Take 60 mg by mouth 2 (two) times daily.    Marland Kitchen HYDROcodone-acetaminophen (NORCO/VICODIN) 5-325 MG per tablet  Take one tablet by mouth every 8 hours for pain 90 tablet 0  . lisinopril (PRINIVIL,ZESTRIL) 2.5 MG tablet Take 2.5 mg by mouth daily. For hypertension    . lubiprostone (AMITIZA) 24 MCG capsule Take 1 capsule (24 mcg total) by mouth 2 (two) times daily with a meal. (Patient taking differently: Take 24 mcg by mouth 2 (two) times daily with a meal. For constipation) 30 capsule 0  . magnesium hydroxide (MILK OF MAGNESIA) 400 MG/5ML suspension Take 30 mLs by mouth daily as needed for mild constipation.    . pantoprazole (PROTONIX) 40 MG tablet Take 1 tablet (40 mg total) by mouth 2 (two) times daily. (Patient  taking differently: Take 40 mg by mouth 2 (two) times daily. For GERD)    . polyethylene glycol (MIRALAX / GLYCOLAX) packet Take 17 g by mouth daily.    . potassium chloride SA (K-DUR,KLOR-CON) 20 MEQ tablet Take 20 mEq by mouth 2 (two) times daily. Give extra 40 mg lasix and potassium 20 meq by mouth if resident weight increase by 3 lbs in 24 hours    . predniSONE (DELTASONE) 20 MG tablet 2 by mouth at breakfast for arthritis    . promethazine (PHENERGAN) 25 MG tablet Take 25 mg by mouth every 6 (six) hours as needed for nausea or vomiting.    . traMADol (ULTRAM) 50 MG tablet Take one tablet by mouth every 8 hours as needed for moderate pain 90 tablet 5  . Vitamin D, Ergocalciferol, (DRISDOL) 50000 UNITS CAPS capsule Take 50,000 Units by mouth every 7 (seven) days. On Sundays, for low vit D     No current facility-administered medications for this visit.    Allergies:    Allergies  Allergen Reactions  . Levaquin [Levofloxacin] Other (See Comments)    Per MAR  . Penicillins Other (See Comments)    Per MAR  . Tetanus-Diphtheria Toxoids Td Other (See Comments)    Per MAR  . Latex Hives and Rash    Social History:  The patient  reports that she has never smoked. Her smokeless tobacco use includes Snuff. She reports that she does not drink alcohol or use illicit drugs.   Family history:   Family History  Problem Relation Age of Onset  . Diabetes Brother   . Hypertension Mother     deceased @ 39  . Leukemia Brother   . Cancer Brother   . Stroke Mother   . Diabetes Father     deceased in his 64's    ROS:  Please see the history of present illness.  All other systems reviewed and negative.   PHYSICAL EXAM: VS:  BP 122/80 mmHg  Pulse 79  Ht   Wt  Morbidly obese, well developed, in no acute distress HEENT: Pupils are equal round react to light accommodation extraocular movements are intact.  Neck: No JVD, No cervical lymphadenopathy. Cardiac: Regular rate and rhythm without  murmurs rubs or gallops. Lungs:  Mild LLL crackles, no wheezing, rhonchi or rales Abd: soft, nontender, positive bowel sounds Ext: no lower extremity edema.  2+ radial and dorsalis pedis pulses. Skin: warm and dry Neuro:  Grossly normal   ASSESSMENT AND PLAN:  Problem List Items Addressed This Visit    Essential hypertension    BP controlled      Relevant Medications   furosemide (LASIX) 20 MG tablet   COMBINED HEART FAILURE, CHRONIC - Primary   Relevant Medications   furosemide (LASIX) 20 MG tablet   Other  Relevant Orders   Basic metabolic panel   Chronic diastolic CHF (congestive heart failure), NYHA class 4    She appears euvolemic.  No LEE or rales on exam and she does not complain of orthopnea.  She is here wearing O2 and her O2 sats are 99%.  Continue current lasix dosing.  Apparently she has been hypokalemic according to the daughter.  I will check a BMET today.      Relevant Medications   furosemide (LASIX) 20 MG tablet   Atrial fibrillation    Rhythm is regular on exam and well controlled.  She is not anticoagulated.       Relevant Medications   furosemide (LASIX) 20 MG tablet

## 2014-02-25 ENCOUNTER — Other Ambulatory Visit: Payer: Self-pay | Admitting: *Deleted

## 2014-02-25 MED ORDER — HYDROCODONE-ACETAMINOPHEN 5-325 MG PO TABS: ORAL_TABLET | ORAL | Status: DC

## 2014-02-25 MED ORDER — LORAZEPAM 0.5 MG PO TABS: ORAL_TABLET | ORAL | Status: DC

## 2014-02-25 NOTE — Telephone Encounter (Signed)
[facility] 

## 2014-02-26 ENCOUNTER — Telehealth: Payer: Self-pay | Admitting: *Deleted

## 2014-02-26 ENCOUNTER — Encounter: Payer: Self-pay | Admitting: *Deleted

## 2014-02-26 NOTE — Telephone Encounter (Signed)
Ok, Hold torsemide and please update med list.

## 2014-02-26 NOTE — Telephone Encounter (Signed)
Had faxed over lab results and med instructions to [facility] this AM. Received call from [facility], patient's nurse. Patient does NOT take furosemide. She takes Demadex 20mg  QD and potassium 23mEq daily.   Will defer to B. Hager, PA to advise on med changes based on this update.

## 2014-02-27 NOTE — Telephone Encounter (Signed)
Medication list updated to reflect torsemide 20mg  QD (instead of furosemide 60mg  BID).   Per B. Hager, PA, patient is to HOLD torsemide & potassium x2 days then resume previous dose.   Letter faxed to [facility] Attn: Princess.

## 2014-02-27 NOTE — Telephone Encounter (Signed)
Spoke with [name] and informed her of med instructions. Confirmed she received fax.

## 2014-03-04 ENCOUNTER — Encounter (HOSPITAL_COMMUNITY): Payer: Medicare Other

## 2014-03-07 ENCOUNTER — Other Ambulatory Visit: Payer: Self-pay | Admitting: Gastroenterology

## 2014-03-07 ENCOUNTER — Encounter (HOSPITAL_COMMUNITY): Payer: Self-pay | Admitting: *Deleted

## 2014-03-07 NOTE — Progress Notes (Signed)
Please complete Anesthesia assessment and Sleep Apnea screening on DOS; unable to complete during SDW pre-op call because pt was not available. Pt assessment was completed by pt nurse, Fara Chute  [facility]). According to [facility],  pt is to be transported by ambulance for an 8:00 AM arrival.

## 2014-03-07 NOTE — Addendum Note (Signed)
Addended by: Wilford Corner on: 03/07/2014 03:51 PM   Modules accepted: Orders

## 2014-03-08 ENCOUNTER — Ambulatory Visit (HOSPITAL_COMMUNITY)
Admission: RE | Admit: 2014-03-08 | Discharge: 2014-03-08 | Disposition: A | Discharge status: Home / Self Care | Payer: Medicare Other | Source: Ambulatory Visit | Attending: Gastroenterology | Admitting: Gastroenterology

## 2014-03-08 ENCOUNTER — Ambulatory Visit (HOSPITAL_COMMUNITY): Payer: Medicare Other | Admitting: Certified Registered"

## 2014-03-08 ENCOUNTER — Encounter (HOSPITAL_COMMUNITY)
Admission: RE | Disposition: A | Discharge status: Home / Self Care | Payer: Self-pay | Source: Ambulatory Visit | Attending: Gastroenterology

## 2014-03-08 ENCOUNTER — Encounter (HOSPITAL_COMMUNITY): Payer: Self-pay | Admitting: *Deleted

## 2014-03-08 DIAGNOSIS — I5042 Chronic combined systolic (congestive) and diastolic (congestive) heart failure: Secondary | ICD-10-CM | POA: Insufficient documentation

## 2014-03-08 DIAGNOSIS — I272 Other secondary pulmonary hypertension: Secondary | ICD-10-CM | POA: Insufficient documentation

## 2014-03-08 DIAGNOSIS — I4891 Unspecified atrial fibrillation: Secondary | ICD-10-CM | POA: Insufficient documentation

## 2014-03-08 DIAGNOSIS — Z7901 Long term (current) use of anticoagulants: Secondary | ICD-10-CM | POA: Insufficient documentation

## 2014-03-08 DIAGNOSIS — K5900 Constipation, unspecified: Secondary | ICD-10-CM | POA: Diagnosis present

## 2014-03-08 DIAGNOSIS — K219 Gastro-esophageal reflux disease without esophagitis: Secondary | ICD-10-CM | POA: Insufficient documentation

## 2014-03-08 DIAGNOSIS — Z6841 Body Mass Index (BMI) 40.0 and over, adult: Secondary | ICD-10-CM | POA: Insufficient documentation

## 2014-03-08 DIAGNOSIS — D509 Iron deficiency anemia, unspecified: Secondary | ICD-10-CM | POA: Insufficient documentation

## 2014-03-08 DIAGNOSIS — I340 Nonrheumatic mitral (valve) insufficiency: Secondary | ICD-10-CM | POA: Insufficient documentation

## 2014-03-08 DIAGNOSIS — M1990 Unspecified osteoarthritis, unspecified site: Secondary | ICD-10-CM | POA: Insufficient documentation

## 2014-03-08 DIAGNOSIS — I129 Hypertensive chronic kidney disease with stage 1 through stage 4 chronic kidney disease, or unspecified chronic kidney disease: Secondary | ICD-10-CM | POA: Insufficient documentation

## 2014-03-08 DIAGNOSIS — K626 Ulcer of anus and rectum: Secondary | ICD-10-CM | POA: Diagnosis not present

## 2014-03-08 DIAGNOSIS — N182 Chronic kidney disease, stage 2 (mild): Secondary | ICD-10-CM | POA: Diagnosis not present

## 2014-03-08 HISTORY — PX: FLEXIBLE SIGMOIDOSCOPY: SHX5431

## 2014-03-08 HISTORY — DX: Gastro-esophageal reflux disease without esophagitis: K21.9

## 2014-03-08 HISTORY — DX: Pneumonia, unspecified organism: J18.9

## 2014-03-08 HISTORY — DX: Reserved for inherently not codable concepts without codable children: IMO0001

## 2014-03-08 SURGERY — SIGMOIDOSCOPY, FLEXIBLE
Anesthesia: Monitor Anesthesia Care

## 2014-03-08 MED ORDER — PROPOFOL INFUSION 10 MG/ML OPTIME
INTRAVENOUS | Status: DC | PRN
  Administered 2014-03-08: 75 ug/kg/min via INTRAVENOUS

## 2014-03-08 MED ORDER — LACTATED RINGERS IV SOLN
INTRAVENOUS | Status: DC | PRN
  Administered 2014-03-08: 11:00:00 via INTRAVENOUS

## 2014-03-08 MED ORDER — SODIUM CHLORIDE 0.9 % IV SOLN: INTRAVENOUS | Status: DC

## 2014-03-08 MED ORDER — FENTANYL CITRATE 0.05 MG/ML IJ SOLN
INTRAMUSCULAR | Status: DC | PRN
  Administered 2014-03-08: 50 ug via INTRAVENOUS

## 2014-03-08 NOTE — Anesthesia Preprocedure Evaluation (Addendum)
Anesthesia Evaluation  Patient identified by MRN, date of birth, ID band Patient awake    Reviewed: Allergy & Precautions, NPO status , Patient's Chart, lab work & pertinent test results  Airway Mallampati: III   Neck ROM: Limited  Mouth opening: Limited Mouth Opening  Dental  (+) Missing, Edentulous Upper, Chipped,    Pulmonary shortness of breath, sleep apnea ,  Hx of resp failure breath sounds clear to auscultation        Cardiovascular hypertension, +CHF and + DOE + Valvular Problems/Murmurs MR Rhythm:Regular Rate:Normal   most recent echo EF 45% mild MR    Neuro/Psych    GI/Hepatic GERD-  ,  Endo/Other  Morbid obesity  Renal/GU Renal InsufficiencyRenal disease     Musculoskeletal  (+) Arthritis -,   Abdominal (+) + obese,   Peds  Hematology  (+) anemia ,   Anesthesia Other Findings   Reproductive/Obstetrics                         Anesthesia Physical Anesthesia Plan  ASA: IV  Anesthesia Plan: MAC   Post-op Pain Management:    Induction: Intravenous  Airway Management Planned: Natural Airway and Nasal Cannula  Additional Equipment:   Intra-op Plan:   Post-operative Plan:   Informed Consent: I have reviewed the patients History and Physical, chart, labs and discussed the procedure including the risks, benefits and alternatives for the proposed anesthesia with the patient or authorized representative who has indicated his/her understanding and acceptance.   Dental advisory given  Plan Discussed with: CRNA and Surgeon  Anesthesia Plan Comments:        Anesthesia Quick Evaluation

## 2014-03-08 NOTE — Interval H&P Note (Signed)
History and Physical Interval Note:  03/08/2014 10:02 AM  [name]  has presented today for surgery, with the diagnosis of rectal pain.constipation  The various methods of treatment have been discussed with the patient and family. After consideration of risks, benefits and other options for treatment, the patient has consented to  Procedure(s) with comments: FLEXIBLE SIGMOIDOSCOPY (N/A) - need hoyer lift as a surgical intervention .  The patient's history has been reviewed, patient examined, no change in status, stable for surgery.  I have reviewed the patient's chart and labs.  Questions were answered to the patient's satisfaction.     [place] C.

## 2014-03-08 NOTE — H&P (Signed)
  Date of Initial H&P: 03/06/14  History reviewed, patient examined, no change in status, stable for surgery.

## 2014-03-08 NOTE — Transfer of Care (Signed)
Immediate Anesthesia Transfer of Care Note  Patient: [name]  Procedure(s) Performed: Procedure(s) with comments: FLEXIBLE SIGMOIDOSCOPY (N/A) - need hoyer lift  Patient Location: Endoscopy Unit  Anesthesia Type:MAC  Level of Consciousness: oriented and sedated  Airway & Oxygen Therapy: Patient Spontanous Breathing and Patient connected to face mask oxygen  Post-op Assessment: Report given to RN, Post -op Vital signs reviewed and stable and Patient moving all extremities X 4  Post vital signs: Reviewed and stable  Last Vitals:  Filed Vitals:   03/08/14 0829  BP: 130/73  Pulse: 75  Temp: 36.9 C  Resp: 16    Complications: No apparent anesthesia complications

## 2014-03-08 NOTE — Op Note (Signed)
[facility] [place], [address]   FLEXIBLE SIGMOIDOSCOPY PROCEDURE REPORT  PATIENT: [name], [name]  MR#: [id number] BIRTHDATE: 1934-12-06 , 47  yrs. old GENDER: female ENDOSCOPIST: Wilford Corner, MD REFERRED BY: Kelton Pillar, M.D. PROCEDURE DATE:  03/08/2014 PROCEDURE:   Sigmoidoscopy, diagnostic ASA CLASS:   Class IV INDICATIONS:constipation.   history of rectal ulcer. MEDICATIONS: Monitored anesthesia care and Per Anesthesia  DESCRIPTION OF PROCEDURE:   After the risks benefits and alternatives of the procedure were thoroughly explained, informed consent was obtained.  Digital exam revealed no abnormalities of the rectum. The     endoscope was introduced through the anus  and advanced to the descending colon , The exam was Without limitations.    The quality of the prep was The overall prep quality was fair. .  The instrument was then slowly withdrawn as the mucosa was fully examined.       No mucosal abnormalities seen in the descending colon and sigmoid colon. Proximal and mid-rectum unremarkable. In the distal rectum there was a 1 cm deep rectal ulcer (likely stercoral). Retroflexion was not performed due to a narrow rectal vault.    The scope was then withdrawn from the patient and the procedure terminated.  COMPLICATIONS: There were no immediate complications.  ENDOSCOPIC IMPRESSION: Distal rectal stercoral ulcer   RECOMMENDATIONS: Laxatives daily; Supportive care; F/U with Dr. Penelope Coop as needed  REPEAT EXAM:  eSigned:  Wilford Corner, MD 03/08/2014 11:08 AM   TT:[id number] Laurann Montana, MD

## 2014-03-08 NOTE — Anesthesia Postprocedure Evaluation (Signed)
Anesthesia Post Note  Patient: [name]  Procedure(s) Performed: Procedure(s) (LRB): FLEXIBLE SIGMOIDOSCOPY (N/A)  Anesthesia type: general  Patient location: PACU  Post pain: Pain level controlled  Post assessment: Patient's Cardiovascular Status Stable  Last Vitals:  Filed Vitals:   03/08/14 1200  BP: 128/58  Pulse: 79  Temp:   Resp: 12    Post vital signs: Reviewed and stable  Level of consciousness: sedated  Complications: No apparent anesthesia complications

## 2014-03-08 NOTE — Addendum Note (Signed)
Addended by: Arta Silence on: 03/08/2014 07:26 AM   Modules accepted: Orders

## 2014-03-08 NOTE — Discharge Instructions (Signed)
Flexible Sigmoidoscopy, Care After °Refer to this sheet in the next few weeks. These instructions provide you with information on caring for yourself after your procedure. Your health care provider may also give you more specific instructions. Your treatment has been planned according to current medical practices, but problems sometimes occur. Call your health care provider if you have any problems or questions after your procedure. °WHAT TO EXPECT AFTER THE PROCEDURE °After your procedure, it is typical to have the following:  °· Abdominal cramps. °· Bloating. °· A small amount of rectal bleeding if you had a biopsy. °HOME CARE INSTRUCTIONS °· Only take over-the-counter or prescription medicines for pain, fever, or discomfort as directed by your health care provider. °· Resume your normal diet and activities as directed by your health care provider. °SEEK MEDICAL CARE IF: °· You have abdominal pain or cramping that lasts longer than 1 hour after the procedure. °· You continue to have small amounts of rectal bleeding after 24 hours. °· You have nausea or vomiting. °· You feel weak or dizzy. °SEEK IMMEDIATE MEDICAL CARE IF:  °· You have a fever. °· You pass large blood clots or see a large amount of blood in the toilet after having a bowel movement. This may also occur 10-14 days after the procedure. It is more likely if you had a biopsy. °· You develop abdominal pain that is not relieved with medicine or your abdominal pain gets worse. °· You have nausea or vomiting for more than 24 hours after the procedure. °Document Released: 01/30/2013 Document Reviewed: 01/30/2013 °[employer]® Patient Information ©2015 [employer]. This information is not intended to replace advice given to you by your health care provider. Make sure you discuss any questions you have with your health care provider. ° °

## 2014-03-11 ENCOUNTER — Encounter (HOSPITAL_COMMUNITY): Payer: Self-pay | Admitting: Gastroenterology

## 2014-03-26 ENCOUNTER — Non-Acute Institutional Stay (SKILLED_NURSING_FACILITY): Payer: Medicare Other | Admitting: Internal Medicine

## 2014-03-26 ENCOUNTER — Encounter: Payer: Self-pay | Admitting: Internal Medicine

## 2014-03-26 DIAGNOSIS — I10 Essential (primary) hypertension: Secondary | ICD-10-CM

## 2014-03-26 DIAGNOSIS — E871 Hypo-osmolality and hyponatremia: Secondary | ICD-10-CM

## 2014-03-26 DIAGNOSIS — I482 Chronic atrial fibrillation, unspecified: Secondary | ICD-10-CM

## 2014-03-26 DIAGNOSIS — S82402G Unspecified fracture of shaft of left fibula, subsequent encounter for closed fracture with delayed healing: Secondary | ICD-10-CM

## 2014-03-26 DIAGNOSIS — D509 Iron deficiency anemia, unspecified: Secondary | ICD-10-CM

## 2014-03-26 DIAGNOSIS — K219 Gastro-esophageal reflux disease without esophagitis: Secondary | ICD-10-CM

## 2014-03-26 DIAGNOSIS — I5032 Chronic diastolic (congestive) heart failure: Secondary | ICD-10-CM

## 2014-03-26 DIAGNOSIS — N182 Chronic kidney disease, stage 2 (mild): Secondary | ICD-10-CM

## 2014-03-26 DIAGNOSIS — S82402A Unspecified fracture of shaft of left fibula, initial encounter for closed fracture: Secondary | ICD-10-CM | POA: Insufficient documentation

## 2014-03-26 DIAGNOSIS — J9611 Chronic respiratory failure with hypoxia: Secondary | ICD-10-CM

## 2014-03-26 DIAGNOSIS — E559 Vitamin D deficiency, unspecified: Secondary | ICD-10-CM | POA: Insufficient documentation

## 2014-03-26 NOTE — Progress Notes (Signed)
MRN: [id number] Name: [name]  Sex: female Age: 80 y.o. DOB: [birth year]  [place] #: [name] Facility/Room: 305 Level Of Care: SNF Provider: Inocencio Homes D Emergency Contacts: Extended Emergency Contact Information Primary Emergency Contact: Haris,Reggie Address: [place] [address], [place] [address] [place] Phone: [phone] Work Phone: [phone] Relation: Son Secondary Emergency Contact: Ruiter,[name]          [place], [place] of [place] Phone: [phone] Relation: Daughter  Code Status: FULL  Allergies: Levaquin; Lexapro; Penicillins; Tetanus-diphtheria toxoids td; and Latex  Chief Complaint  Patient presents with  . Medical Management of Chronic Issues    HPI: Patient is 80 y.o. female who is being seen for routine issues.  Past Medical History  Diagnosis Date  . HTN (hypertension)   . Diverticulosis   . Hemorrhoid   . Osteoarthritis   . Constipation, chronic   . H/O: hysterectomy   . Systolic and diastolic CHF, chronic     a. Echo 07/2011: Technically limited; inferior HK, inferolateral and possibly lateral wall HK, EF 40%, moderate LVE, aortic sclerosis without stenosis, mild MR, mild LAE, question RV dysfunction;   b.  Echo (04/2013): EF 50-55%, normal wall motion, moderate MR, mild to moderate LAE, PASP 37  . Female bladder prolapse     with prolapse uterus, cystocele, s/p TVH/BSO 12/2006  . Obesity (BMI 30-39.9)     wt. 94.1 kg 12/2010  . Moderate mitral regurgitation     a. Mod to Sev by echo 01/2009;  b. mild MR by echo 2013  . Atrial fibrillation 10/13/2011    coumadin Rx  . Iron deficiency anemia   . Pneumonia   . Shortness of breath dyspnea   . GERD (gastroesophageal reflux disease)     Past Surgical History  Procedure Laterality Date  . Wrist surgery    . Vaginal hysterectomy,cystocele and prolapse  repair  NOV. 2008  . Abdominal hysterectomy    . Colonoscopy N/A 07/18/2013     Procedure: COLONOSCOPY;  Surgeon: Wonda Horner, MD;  Location: [facility];  Service: Endoscopy;  Laterality: N/A;  . Flexible sigmoidoscopy N/A 03/08/2014    Procedure: FLEXIBLE SIGMOIDOSCOPY;  Surgeon: Lear Ng, MD;  Location: [facility] ENDOSCOPY;  Service: Endoscopy;  Laterality: N/A;  need hoyer lift      Medication List       This list is accurate as of: 03/26/14  7:21 PM.  Always use your most recent med list.               albuterol (2.5 MG/3ML) 0.083% nebulizer solution  Commonly known as:  PROVENTIL  Take 2.5 mg by nebulization every 8 (eight) hours as needed for wheezing or shortness of breath (For wheezing and shortness of breath).     bisacodyl 10 MG suppository  Commonly known as:  DULCOLAX  Place 10 mg rectally daily as needed for moderate constipation (if constipation not relieved by MOM).     carvedilol 3.125 MG tablet  Commonly known as:  COREG  Take 3.125 mg by mouth 2 (two) times daily with a meal. Hold if SBP <100     feeding supplement (PRO-STAT SUGAR FREE 64) Liqd  Take 30 mLs by mouth daily.     FERRETTS 325 (106 FE) MG Tabs tablet  Generic drug:  ferrous fumarate  Take 1 tablet by mouth 2 (two) times daily. 8am and 4pm for Iron Deficiency  HYDROcodone-acetaminophen 5-325 MG per tablet  Commonly known as:  NORCO/VICODIN  Take one tablet by mouth every four hours for pain     lisinopril 2.5 MG tablet  Commonly known as:  PRINIVIL,ZESTRIL  Take 2.5 mg by mouth daily. For hypertension     LORazepam 0.5 MG tablet  Commonly known as:  ATIVAN  Take one tablet by mouth every 6 hours as needed for anxiety     lubiprostone 24 MCG capsule  Commonly known as:  AMITIZA  Take 1 capsule (24 mcg total) by mouth 2 (two) times daily with a meal.     magnesium hydroxide 400 MG/5ML suspension  Commonly known as:  MILK OF MAGNESIA  Take 30 mLs by mouth daily as needed for mild constipation.     metolazone 2.5 MG tablet  Commonly known as:  ZAROXOLYN   Take 2.5 mg by mouth once a week. Weekly on Wed     pantoprazole 40 MG tablet  Commonly known as:  PROTONIX  Take 1 tablet (40 mg total) by mouth 2 (two) times daily.     polyethylene glycol packet  Commonly known as:  MIRALAX / GLYCOLAX  Take 17 g by mouth daily.     potassium chloride SA 20 MEQ tablet  Commonly known as:  K-DUR,KLOR-CON  Take 20 mEq by mouth 2 (two) times daily. Give extra 40 mg lasix and potassium 20 meq by mouth if resident weight increase by 3 lbs in 24 hours     predniSONE 20 MG tablet  Commonly known as:  DELTASONE  2 by mouth at breakfast for arthritis     promethazine 25 MG tablet  Commonly known as:  PHENERGAN  Take 25 mg by mouth every 6 (six) hours as needed for nausea or vomiting.     torsemide 20 MG tablet  Commonly known as:  DEMADEX  Take 20 mg by mouth 2 (two) times daily.     traMADol 50 MG tablet  Commonly known as:  ULTRAM  Take one tablet by mouth every 8 hours as needed for moderate pain     Vitamin D (Ergocalciferol) 50000 UNITS Caps capsule  Commonly known as:  DRISDOL  Take 50,000 Units by mouth every 7 (seven) days. On Sundays, for low vit D        Meds ordered this encounter  Medications  . metolazone (ZAROXOLYN) 2.5 MG tablet    Sig: Take 2.5 mg by mouth once a week. Weekly on Wed     There is no immunization history on file for this patient.  History  Substance Use Topics  . Smoking status: Never Smoker   . Smokeless tobacco: Former Systems developer    Types: Snuff  . Alcohol Use: No    Review of Systems  DATA OBTAINED: from patient, nurse GENERAL:  no fevers, fatigue, appetite changes SKIN: No itching, rash HEENT: No complaint RESPIRATORY: No cough, wheezing, SOB CARDIAC: No chest pain, palpitations, lower extremity edema  GI: No abdominal pain, No N/V/D or constipation, No heartburn or reflux  GU: No dysuria, frequency or urgency, or incontinence  MUSCULOSKELETAL: No unrelieved bone/joint pain NEUROLOGIC: No  headache, dizziness  PSYCHIATRIC: No overt anxiety or sadness  Filed Vitals:   03/26/14 1358  BP: 142/87  Pulse: 73  Temp: 97.3 F (36.3 C)  Resp: 20    Physical Exam  GENERAL APPEARANCE: Alert, conversant, No acute distress  SKIN: No diaphoresis rash HEENT: Unremarkable RESPIRATORY: Breathing is even, unlabored. Lung sounds are clear  CARDIOVASCULAR: Heart RRR no murmurs, rubs or gallops. No peripheral edema  GASTROINTESTINAL: Abdomen is soft, non-tender, not distended w/ normal bowel sounds.  GENITOURINARY: Bladder non tender, not distended  MUSCULOSKELETAL: pink cast L ankle; lipoma at R wrist NEUROLOGIC: Cranial nerves 2-12 grossly intact. Moves all extremities PSYCHIATRIC: Mood and affect appropriate to situation, no behavioral issues  Patient Active Problem List   Diagnosis Date Noted  . Vitamin D deficiency 03/26/2014  . Chronic kidney disease (CKD), stage II (mild) 10/18/2013  . Chronic respiratory failure 08/28/2013  . Chronic diastolic heart failure 83/66/2947  . Immobility 08/15/2013  . Acute respiratory failure 08/13/2013  . GERD (gastroesophageal reflux disease) 08/13/2013  . CHF (congestive heart failure) 08/04/2013  . Congestive heart failure, NYHA class 4 08/04/2013  . GI bleed 07/13/2013  . History of lower GI bleeding 07/11/2013  . AKI (acute kidney injury) 06/29/2013  . Acute lower GI bleeding 06/23/2013  . PNA (pneumonia) 06/19/2013  . Rash and nonspecific skin eruption 06/19/2013  . Chronic respiratory failure with hypoxia 06/18/2013  . Acute encephalopathy 06/07/2013  . Diastolic CHF 65/46/5035  . Pulmonary hypertension 06/05/2013  . Weakness 06/03/2013  . Encounter for therapeutic drug monitoring 04/10/2013  . Long term current use of anticoagulant 10/26/2011  . Hypokalemia 10/15/2011  . Abdominal pain 10/15/2011  . Iron deficiency anemia 10/15/2011  . Bladder prolapse, female, acquired 10/15/2011  . Atrial fibrillation 10/15/2011  . Acute  on chronic combined systolic and diastolic congestive heart failure 07/22/2011  . Facial edema 12/30/2010  . Leukopenia 12/30/2010  . Neutropenia 12/30/2010  . Chronic pain 12/25/2010  . Hematochezia 12/25/2010  . Dehydration 12/25/2010  . Thrombocytosis 12/25/2010  . Hyponatremia 12/25/2010  . Chronic diastolic CHF (congestive heart failure), NYHA class 4   . Female bladder prolapse   . Mitral regurgitation 06/04/2010  . HYPOPOTASSEMIA 09/24/2009  . COMBINED HEART FAILURE, CHRONIC 09/09/2009  . CARDIOMYOPATHY 03/04/2009  . ANASARCA 03/04/2009  . Essential hypertension 02/20/2009  . CONSTIPATION, CHRONIC 02/20/2009  . DIVERTICULOSIS 02/20/2009    CBC    Component Value Date/Time   WBC 10.7* 08/09/2013 0357   RBC 3.48* 08/09/2013 0357   RBC 1.95* 06/23/2013 1118   HGB 10.4* 08/09/2013 0357   HCT 33.4* 08/09/2013 0357   PLT 280 08/09/2013 0357   MCV 96.0 08/09/2013 0357   LYMPHSABS 1.3 08/05/2013 0800   MONOABS 0.4 08/05/2013 0800   EOSABS 0.1 08/05/2013 0800   BASOSABS 0.0 08/05/2013 0800    CMP     Component Value Date/Time   NA 130* 02/22/2014 1613   K 4.6 02/22/2014 1613   CL 93* 02/22/2014 1613   CO2 25 02/22/2014 1613   GLUCOSE 178* 02/22/2014 1613   BUN 43* 02/22/2014 1613   CREATININE 1.62* 02/22/2014 1613   CREATININE 1.06 11/28/2013 1110   CALCIUM 8.7 02/22/2014 1613   PROT 6.0 08/05/2013 0800   ALBUMIN 2.7* 08/05/2013 0800   AST 33 08/05/2013 0800   ALT 86* 08/05/2013 0800   ALKPHOS 120* 08/05/2013 0800   BILITOT 0.3 08/05/2013 0800   GFRNONAA 49* 11/28/2013 1110   GFRAA 57* 11/28/2013 1110    Assessment and Plan  Congestive heart failure, NYHA class 4 Main problem, alternates between wet and dry;last acute exacerbation 01/2014, BNP was 2907; resolved at SNF with careful diuresis; currently pt is without pedal edema and 2/10 Na+ 134, BUN 29/Cr 1.06 .   Essential hypertension Controlled on coreg, ACE demedex   Atrial fibrillation Rhythm is  regular, rate controlled;not  on anti-coag.   Chronic respiratory failure with hypoxia Pt is wearing O2 today while she sits in chair; no c/o SOB   GERD (gastroesophageal reflux disease) Pt on protonix 40 mg BID with stable control;continue protonix   Chronic kidney disease (CKD), stage II (mild) This month BUN 29/Cr1.06 which is at baseline   Iron deficiency anemia Hb 11.5/Hct 35.2, stable;continue ferrous sulfate;no signs of bleeding   Hyponatremia Na+ on 2/10 was 134;continie monitor   Vitamin D deficiency Vit d in 12/2013 was 32. Continue supplementation weekly     Hennie Duos, MD

## 2014-03-26 NOTE — Assessment & Plan Note (Signed)
Na+ on 2/10 was 134;continie monitor

## 2014-03-26 NOTE — Assessment & Plan Note (Addendum)
Seen by ortho 2/4 - fracture is slow in healing-cont cast;cont ortho follow

## 2014-03-26 NOTE — Assessment & Plan Note (Signed)
Pt on protonix 40 mg BID with stable control;continue protonix

## 2014-03-26 NOTE — Assessment & Plan Note (Signed)
Vit d in 12/2013 was 32. Continue supplementation weekly

## 2014-03-26 NOTE — Assessment & Plan Note (Signed)
This month BUN 29/Cr1.06 which is at baseline

## 2014-03-26 NOTE — Assessment & Plan Note (Signed)
Pt is wearing O2 today while she sits in chair; no c/o SOB

## 2014-03-26 NOTE — Assessment & Plan Note (Signed)
Controlled on coreg, ACE demedex

## 2014-03-26 NOTE — Assessment & Plan Note (Signed)
Hb 11.5/Hct 35.2, stable;continue ferrous sulfate;no signs of bleeding

## 2014-03-26 NOTE — Assessment & Plan Note (Signed)
Rhythm is regular, rate controlled;not on anti-coag.

## 2014-03-26 NOTE — Assessment & Plan Note (Signed)
Main problem, alternates between wet and dry;last acute exacerbation 01/2014, BNP was 2907; resolved at SNF with careful diuresis; currently pt is without pedal edema and 2/10 Na+ 134, BUN 29/Cr 1.06 .

## 2014-04-09 ENCOUNTER — Ambulatory Visit: Payer: Self-pay

## 2014-04-09 DIAGNOSIS — I4891 Unspecified atrial fibrillation: Secondary | ICD-10-CM

## 2014-04-09 DIAGNOSIS — W19XXXA Unspecified fall, initial encounter: Secondary | ICD-10-CM

## 2014-04-09 DIAGNOSIS — Y92009 Unspecified place in unspecified non-institutional (private) residence as the place of occurrence of the external cause: Secondary | ICD-10-CM

## 2014-04-09 DIAGNOSIS — Z5181 Encounter for therapeutic drug level monitoring: Secondary | ICD-10-CM

## 2014-04-09 HISTORY — DX: Unspecified place in unspecified non-institutional (private) residence as the place of occurrence of the external cause: W19.XXXA

## 2014-04-09 HISTORY — DX: Unspecified place in unspecified non-institutional (private) residence as the place of occurrence of the external cause: Y92.009

## 2014-04-26 ENCOUNTER — Non-Acute Institutional Stay (SKILLED_NURSING_FACILITY): Payer: Medicare Other | Admitting: Nurse Practitioner

## 2014-04-26 DIAGNOSIS — N182 Chronic kidney disease, stage 2 (mild): Secondary | ICD-10-CM

## 2014-04-26 DIAGNOSIS — I10 Essential (primary) hypertension: Secondary | ICD-10-CM | POA: Diagnosis not present

## 2014-04-26 DIAGNOSIS — I4891 Unspecified atrial fibrillation: Secondary | ICD-10-CM

## 2014-04-26 DIAGNOSIS — J9611 Chronic respiratory failure with hypoxia: Secondary | ICD-10-CM

## 2014-04-26 DIAGNOSIS — S82202D Unspecified fracture of shaft of left tibia, subsequent encounter for closed fracture with routine healing: Secondary | ICD-10-CM

## 2014-04-26 DIAGNOSIS — S82202A Unspecified fracture of shaft of left tibia, initial encounter for closed fracture: Secondary | ICD-10-CM | POA: Insufficient documentation

## 2014-04-26 DIAGNOSIS — I5032 Chronic diastolic (congestive) heart failure: Secondary | ICD-10-CM | POA: Diagnosis not present

## 2014-04-26 NOTE — Progress Notes (Signed)
Patient ID: [name], female   DOB: [birth year], 80 y.o.   MRN: [id number]    Nursing Home Location:  [facility]   Place of Service: SNF (62)  PCP: Osborne Casco, MD  Allergies  Allergen Reactions  . Levaquin [Levofloxacin] Other (See Comments)    Per MAR  . Lexapro [Escitalopram Oxalate] Swelling  . Penicillins Other (See Comments)    Per MAR  . Tetanus-Diphtheria Toxoids Td Other (See Comments)    Per MAR  . Latex Hives and Rash    Chief Complaint  Patient presents with  . Medical Management of Chronic Issues    HPI:  Patient is a 80 y.o. female seen today at [facility] for routine follow up. ot with pmh of CHF, OA, CKD, Chronic respiratory failure with hypoxia, and HTN. Pt had assisted fall back in January 2016. Original x-rays were obtain and were negative but pain persisted. She was sent out to [facility] for further imagining which revealed distal tibial  fracture and now is being followed by ortho. Currently has soft brace to left ankle.  She has no complaints today except that she is tired.  She reports eating well and feeling well.     Review of Systems:  Review of Systems  Constitutional: Negative for activity change, appetite change, fatigue and unexpected weight change.  HENT: Negative for congestion, hearing loss, rhinorrhea and sore throat.   Eyes: Negative.   Respiratory: Negative for cough, shortness of breath (Chronic) and wheezing.   Cardiovascular: Positive for leg swelling (generalized). Negative for chest pain and palpitations.  Gastrointestinal: Negative for abdominal pain, diarrhea and constipation.  Genitourinary: Negative for dysuria, flank pain and difficulty urinating.  Musculoskeletal: Positive for arthralgias. Negative for myalgias.  Skin: Negative for color change and wound.  Neurological: Negative for dizziness and weakness.  Psychiatric/Behavioral: Negative for behavioral problems, confusion and  agitation.    Past Medical History  Diagnosis Date  . HTN (hypertension)   . Diverticulosis   . Hemorrhoid   . Osteoarthritis   . Constipation, chronic   . H/O: hysterectomy   . Systolic and diastolic CHF, chronic     a. Echo 07/2011: Technically limited; inferior HK, inferolateral and possibly lateral wall HK, EF 40%, moderate LVE, aortic sclerosis without stenosis, mild MR, mild LAE, question RV dysfunction;   b.  Echo (04/2013): EF 50-55%, normal wall motion, moderate MR, mild to moderate LAE, PASP 37  . Female bladder prolapse     with prolapse uterus, cystocele, s/p TVH/BSO 12/2006  . Obesity (BMI 30-39.9)     wt. 94.1 kg 12/2010  . Moderate mitral regurgitation     a. Mod to Sev by echo 01/2009;  b. mild MR by echo 2013  . Atrial fibrillation 10/13/2011    coumadin Rx  . Iron deficiency anemia   . Pneumonia   . Shortness of breath dyspnea   . GERD (gastroesophageal reflux disease)    Past Surgical History  Procedure Laterality Date  . Wrist surgery    . Vaginal hysterectomy,cystocele and prolapse  repair  NOV. 2008  . Abdominal hysterectomy    . Colonoscopy N/A 07/18/2013    Procedure: COLONOSCOPY;  Surgeon: Wonda Horner, MD;  Location: [facility] ENDOSCOPY;  Service: Endoscopy;  Laterality: N/A;  . Flexible sigmoidoscopy N/A 03/08/2014    Procedure: FLEXIBLE SIGMOIDOSCOPY;  Surgeon: Lear Ng, MD;  Location: [facility] ENDOSCOPY;  Service: Endoscopy;  Laterality: N/A;  need hoyer lift  Social History:   reports that she has never smoked. She has quit using smokeless tobacco. Her smokeless tobacco use included Snuff. She reports that she does not drink alcohol or use illicit drugs.  Family History  Problem Relation Age of Onset  . Diabetes Brother   . Hypertension Mother     deceased @ 40  . Leukemia Brother   . Cancer Brother   . Stroke Mother   . Diabetes Father     deceased in his 66's    Medications: Patient's Medications  New Prescriptions   No medications on  file  Previous Medications   ALBUTEROL (PROVENTIL) (2.5 MG/3ML) 0.083% NEBULIZER SOLUTION    Take 2.5 mg by nebulization every 8 (eight) hours as needed for wheezing or shortness of breath (For wheezing and shortness of breath).    AMINO ACIDS-PROTEIN HYDROLYS (FEEDING SUPPLEMENT, PRO-STAT SUGAR FREE 64,) LIQD    Take 30 mLs by mouth daily.    BISACODYL (DULCOLAX) 10 MG SUPPOSITORY    Place 10 mg rectally daily as needed for moderate constipation (if constipation not relieved by MOM).   CARVEDILOL (COREG) 3.125 MG TABLET    Take 3.125 mg by mouth 2 (two) times daily with a meal. Hold if SBP <100   FERROUS FUMARATE (FERRETTS) 325 (106 FE) MG TABS    Take 1 tablet by mouth 2 (two) times daily. 8am and 4pm for Iron Deficiency   HYDROCODONE-ACETAMINOPHEN (NORCO/VICODIN) 5-325 MG PER TABLET    Take one tablet by mouth every four hours for pain   LISINOPRIL (PRINIVIL,ZESTRIL) 2.5 MG TABLET    Take 2.5 mg by mouth daily. For hypertension   LORAZEPAM (ATIVAN) 0.5 MG TABLET    Take one tablet by mouth every 6 hours as needed for anxiety   LUBIPROSTONE (AMITIZA) 24 MCG CAPSULE    Take 1 capsule (24 mcg total) by mouth 2 (two) times daily with a meal.   MAGNESIUM HYDROXIDE (MILK OF MAGNESIA) 400 MG/5ML SUSPENSION    Take 30 mLs by mouth daily as needed for mild constipation.   METOLAZONE (ZAROXOLYN) 2.5 MG TABLET    Take 2.5 mg by mouth once a week. Weekly on Wed   PANTOPRAZOLE (PROTONIX) 40 MG TABLET    Take 1 tablet (40 mg total) by mouth 2 (two) times daily.   POLYETHYLENE GLYCOL (MIRALAX / GLYCOLAX) PACKET    Take 17 g by mouth daily.   POTASSIUM CHLORIDE SA (K-DUR,KLOR-CON) 20 MEQ TABLET    Take 20 mEq by mouth 2 (two) times daily. Give extra 40 mg lasix and potassium 20 meq by mouth if resident weight increase by 3 lbs in 24 hours   PREDNISONE (DELTASONE) 20 MG TABLET    2 by mouth at breakfast for arthritis   PROMETHAZINE (PHENERGAN) 25 MG TABLET    Take 25 mg by mouth every 6 (six) hours as needed for  nausea or vomiting.   TORSEMIDE (DEMADEX) 20 MG TABLET    Take 20 mg by mouth 2 (two) times daily.    TRAMADOL (ULTRAM) 50 MG TABLET    Take one tablet by mouth every 8 hours as needed for moderate pain   VITAMIN D, ERGOCALCIFEROL, (DRISDOL) 50000 UNITS CAPS CAPSULE    Take 50,000 Units by mouth every 7 (seven) days. On Sundays, for low vit D  Modified Medications   No medications on file  Discontinued Medications   No medications on file     Physical Exam: Filed Vitals:   04/26/14 2197  BP: 114/68  Pulse: 81  Temp: 96.4 F (35.8 C)  TempSrc: Oral  Resp: 20  Weight: 234 lb 9.6 oz (106.414 kg)    Physical Exam  Constitutional: She appears well-developed. No distress.  HENT:  Head: Normocephalic and atraumatic.  Mouth/Throat: Oropharynx is clear and moist. No oropharyngeal exudate.  Eyes: Conjunctivae are normal. Pupils are equal, round, and reactive to light.  Neck: Normal range of motion. Neck supple. No JVD present.  Cardiovascular: Normal rate and regular rhythm.   Pulmonary/Chest: Effort normal and breath sounds normal. She has no wheezes. She has no rales.  Abdominal: Soft. Bowel sounds are normal.  Musculoskeletal: She exhibits edema (trace to bilateral LE). She exhibits no tenderness.  Neurological: She is alert.  Skin: Skin is warm and dry. She is not diaphoretic.  Psychiatric: She has a normal mood and affect.    Labs reviewed: Basic Metabolic Panel:  Recent Labs  06/03/13 1825  06/05/13 1200 06/06/13 0233  08/15/13 1044 11/28/13 1110 02/22/14 1613  NA  --   < >  --  129*  < > 135* 142 130*  K  --   < > 4.1 3.8  < > 4.2 3.8 4.6  CL  --   < >  --  96  < > 93* 98 93*  CO2  --   < >  --  22  < > 26 28 25   GLUCOSE  --   < >  --  90  < > 103* 137* 178*  BUN  --   < >  --  29*  < > 52* 26* 43*  CREATININE  --   < >  --  1.45*  < > 1.11* 1.06 1.62*  CALCIUM  --   < >  --  7.4*  < > 8.8 8.9 8.7  MG 2.6*  --  2.0 1.9  --   --   --   --   PHOS 3.2  --   --    --   --   --   --   --   < > = values in this interval not displayed. Liver Function Tests:  Recent Labs  07/13/13 1325 07/14/13 0541 08/05/13 0800  AST 11 8 33  ALT 10 9 86*  ALKPHOS 88 79 120*  BILITOT 0.2* 0.3 0.3  PROT 6.2 5.0* 6.0  ALBUMIN 3.0* 2.4* 2.7*    Recent Labs  06/03/13 1135 07/13/13 1325  LIPASE 9* 13    Recent Labs  06/04/13 1120  AMMONIA 65*   CBC:  Recent Labs  07/13/13 1325  08/04/13 0322  08/04/13 0811 08/05/13 0800 08/09/13 0357  WBC 6.2  < > 19.9*  --  13.2* 10.0 10.7*  NEUTROABS 4.1  --  18.1*  --   --  8.2*  --   HGB 8.7*  < > 10.1*  < > 10.0* 10.2* 10.4*  HCT 27.8*  < > 32.5*  < > 31.6* 32.5* 33.4*  MCV 98.6  < > 97.6  --  98.1 97.0 96.0  PLT 259  < > 308  --  277 280 280  < > = values in this interval not displayed. TSH:  Recent Labs  06/03/13 1825 08/04/13 0811  TSH 2.070 2.500   A1C: Lab Results  Component Value Date   HGBA1C 5.6 06/03/2013   Lipid Panel: No results for input(s): CHOL, HDL, LDLCALC, TRIG, CHOLHDL, LDLDIRECT in the last 8760 hours.   Sodium 128  L 135-145 mEq/L SLN   Potassium 3.4   L 3.5-5.3 mEq/L SLN   Chloride 88   L 96-112 mEq/L SLN   CO2 31     19-32 mEq/L SLN   Glucose 99     70-99 mg/dL SLN   BUN 85   H 6-23 mg/dL SLN   Creatinine 1.64   H 0.50-1.10 mg/dL SLN   Calcium 8.7     8.4-10.5 mg/dL SLN    Assessment/Plan 1. Chronic diastolic CHF (congestive heart failure), NYHA class 4 Trace edema on bilateral extremities.  No JVD.  Patient is not short of breath.  Currently maintained on metolazone and demedex.  Potassium stable.  Will follow up BMP next week.  2. Atrial fibrillation, unspecified Rate controlled on carvedilol.  Rhythm is regular.   3. Essential hypertension Blood pressure within desirable range.  Lisinopril stopped due to creatinine increase.  Creatinine stable now.  Continue same.  Will obtain BMP today.    4. Chronic respiratory failure with hypoxia Stable. Wearing  continuous O2 today. Continue same.    5. Chronic kidney disease (CKD), stage II (mild) Creatinine stable at 0.92.  BUN 28.    6. Left Tibial fracture, routine healing  -maintained in brace, participating in PT -conts to be followed by ortho

## 2014-05-03 ENCOUNTER — Ambulatory Visit (HOSPITAL_COMMUNITY)
Admission: RE | Admit: 2014-05-03 | Discharge: 2014-05-03 | Disposition: A | Discharge status: Home / Self Care | Payer: Medicare Other | Source: Ambulatory Visit | Attending: Internal Medicine | Admitting: Internal Medicine

## 2014-05-03 ENCOUNTER — Other Ambulatory Visit: Payer: Self-pay | Admitting: Internal Medicine

## 2014-05-03 ENCOUNTER — Other Ambulatory Visit (HOSPITAL_COMMUNITY): Payer: Self-pay | Admitting: Internal Medicine

## 2014-05-03 ENCOUNTER — Encounter (HOSPITAL_COMMUNITY): Payer: Medicare Other

## 2014-05-03 DIAGNOSIS — W19XXXA Unspecified fall, initial encounter: Secondary | ICD-10-CM

## 2014-05-03 DIAGNOSIS — R102 Pelvic and perineal pain: Secondary | ICD-10-CM | POA: Insufficient documentation

## 2014-05-03 DIAGNOSIS — Y92129 Unspecified place in nursing home as the place of occurrence of the external cause: Principal | ICD-10-CM

## 2014-05-03 DIAGNOSIS — M79605 Pain in left leg: Secondary | ICD-10-CM | POA: Diagnosis not present

## 2014-05-03 DIAGNOSIS — M545 Low back pain: Secondary | ICD-10-CM | POA: Diagnosis present

## 2014-05-03 DIAGNOSIS — M25572 Pain in left ankle and joints of left foot: Secondary | ICD-10-CM

## 2014-05-03 DIAGNOSIS — M79604 Pain in right leg: Secondary | ICD-10-CM | POA: Diagnosis not present

## 2014-05-14 ENCOUNTER — Encounter: Payer: Self-pay | Admitting: Cardiology

## 2014-05-14 ENCOUNTER — Ambulatory Visit (INDEPENDENT_AMBULATORY_CARE_PROVIDER_SITE_OTHER): Payer: Medicare Other | Admitting: Cardiology

## 2014-05-14 VITALS — BP 108/56 | HR 85 | Ht 64.0 in | Wt 231.0 lb

## 2014-05-14 DIAGNOSIS — Z79899 Other long term (current) drug therapy: Secondary | ICD-10-CM

## 2014-05-14 NOTE — Progress Notes (Signed)
HPI The patient presents for followup of her mixed heart failure.   The patient is living at [facility].  Her daughter is quite upset about this arrangement She has had m and would like to have home. However, the patient apparently wants to stay there. There's been no acute cardiovascular issues recently and no hospitalizations or ER visits for heart failure. I reviewed the electronic chart. The patient actually seems to be breathing well on continuous oxygen. Her exam demonstrates less edema than I have ever noted before. She says she still hurts all over but she's not describing new PND or orthopnea. She's not having any new chest discomfort.   Allergies  Allergen Reactions  . Levaquin [Levofloxacin] Other (See Comments)    Per MAR  . Lexapro [Escitalopram Oxalate] Swelling  . Penicillins Other (See Comments)    Per MAR  . Tetanus-Diphtheria Toxoids Td Other (See Comments)    Per MAR  . Latex Hives and Rash    Current Outpatient Prescriptions  Medication Sig Dispense Refill  . albuterol (PROVENTIL) (2.5 MG/3ML) 0.083% nebulizer solution Take 2.5 mg by nebulization every 8 (eight) hours as needed for wheezing or shortness of breath (For wheezing and shortness of breath).     . Amino Acids-Protein Hydrolys (FEEDING SUPPLEMENT, PRO-STAT SUGAR FREE 64,) LIQD Take 30 mLs by mouth daily.     . bisacodyl (DULCOLAX) 10 MG suppository Place 10 mg rectally daily as needed for moderate constipation (if constipation not relieved by MOM).    . carvedilol (COREG) 3.125 MG tablet Take 3.125 mg by mouth 2 (two) times daily with a meal. Hold if SBP <100    . ferrous fumarate (FERRETTS) 325 (106 FE) MG TABS Take 1 tablet by mouth 2 (two) times daily. 8am and 4pm for Iron Deficiency    . HYDROcodone-acetaminophen (NORCO/VICODIN) 5-325 MG per tablet Take one tablet by mouth every four hours for pain (Patient taking differently: 1 tablet every 6 (six) hours as needed. Take one tablet by mouth every four hours  for pain) 180 tablet 0  . lisinopril (PRINIVIL,ZESTRIL) 2.5 MG tablet Take 2.5 mg by mouth daily.    Marland Kitchen LORazepam (ATIVAN) 0.5 MG tablet Take one tablet by mouth every 6 hours as needed for anxiety 120 tablet 5  . lubiprostone (AMITIZA) 24 MCG capsule Take 1 capsule (24 mcg total) by mouth 2 (two) times daily with a meal. (Patient taking differently: Take 24 mcg by mouth 2 (two) times daily with a meal. For constipation) 30 capsule 0  . magnesium hydroxide (MILK OF MAGNESIA) 400 MG/5ML suspension Take 30 mLs by mouth daily as needed for mild constipation.    . metolazone (ZAROXOLYN) 2.5 MG tablet Take 2.5 mg by mouth once a week. Weekly on Wed    . pantoprazole (PROTONIX) 40 MG tablet Take 1 tablet (40 mg total) by mouth 2 (two) times daily. (Patient taking differently: Take 40 mg by mouth 2 (two) times daily. For GERD)    . polyethylene glycol (MIRALAX / GLYCOLAX) packet Take 17 g by mouth daily.    . predniSONE (DELTASONE) 20 MG tablet 2 by mouth at breakfast for arthritis    . promethazine (PHENERGAN) 25 MG tablet Take 25 mg by mouth every 6 (six) hours as needed for nausea or vomiting.    . torsemide (DEMADEX) 20 MG tablet Take 20 mg by mouth 2 (two) times daily.     . traMADol (ULTRAM) 50 MG tablet Take one tablet by mouth every 8 hours  as needed for moderate pain 90 tablet 5  . Vitamin D, Ergocalciferol, (DRISDOL) 50000 UNITS CAPS capsule Take 50,000 Units by mouth every 7 (seven) days. On Sundays, for low vit D     No current facility-administered medications for this visit.    Past Medical History  Diagnosis Date  . HTN (hypertension)   . Diverticulosis   . Hemorrhoid   . Osteoarthritis   . Constipation, chronic   . H/O: hysterectomy   . Systolic and diastolic CHF, chronic     a. Echo 07/2011: Technically limited; inferior HK, inferolateral and possibly lateral wall HK, EF 40%, moderate LVE, aortic sclerosis without stenosis, mild MR, mild LAE, question RV dysfunction;   b.  Echo  (04/2013): EF 50-55%, normal wall motion, moderate MR, mild to moderate LAE, PASP 37  . Female bladder prolapse     with prolapse uterus, cystocele, s/p TVH/BSO 12/2006  . Obesity (BMI 30-39.9)     wt. 94.1 kg 12/2010  . Moderate mitral regurgitation     a. Mod to Sev by echo 01/2009;  b. mild MR by echo 2013  . Atrial fibrillation 10/13/2011    coumadin Rx  . Iron deficiency anemia   . Pneumonia   . Shortness of breath dyspnea   . GERD (gastroesophageal reflux disease)     Past Surgical History  Procedure Laterality Date  . Wrist surgery    . Vaginal hysterectomy,cystocele and prolapse  repair  NOV. 2008  . Abdominal hysterectomy    . Colonoscopy N/A 07/18/2013    Procedure: COLONOSCOPY;  Surgeon: Wonda Horner, MD;  Location: [facility] ENDOSCOPY;  Service: Endoscopy;  Laterality: N/A;  . Flexible sigmoidoscopy N/A 03/08/2014    Procedure: FLEXIBLE SIGMOIDOSCOPY;  Surgeon: Lear Ng, MD;  Location: [facility] ENDOSCOPY;  Service: Endoscopy;  Laterality: N/A;  need hoyer lift    ROS:  Diffuse aches and pains.  Otherwise as stated in the HPI and negative for all other systems.  PHYSICAL EXAM BP 108/56 mmHg  Pulse 85  Ht 5\' 4"  (1.626 m)  Wt 231 lb (104.781 kg)  BMI 39.63 kg/m2 PHYSICAL EXAM GEN:  No distress NECK:  No jugular venous distention at 90 degrees, waveform within normal limits, carotid upstroke brisk and symmetric, no bruits, no thyromegaly, conjunctiva injected LYMPHATICS:  No cervical adenopathy LUNGS:  Few crackles right greater than left BACK:  No CVA tenderness CHEST:  Unremarkable HEART:  S1 and S2 within normal limits, no S3, no clicks, no rubs, no murmurs, irregular ABD:  Positive bowel sounds normal in frequency in pitch, no bruits, no rebound, no guarding, unable to assess midline mass or bruit with the patient seated,morbidly obese EXT:  2 plus pulses throughout, no edema, no cyanosis no clubbing  EKG:  Atrial fib, rate 86, left axis deviation, poor anterior  R-wave progression, no acute ST-T wave changes, no change from previous  ASSESSMENT AND PLAN  Acute on Chronic Combined Systolic and Diastolic CHF:  She seems to be euvolemic.  She actually has less edema than I have seen previously.  I will continue the meds as listed and check a basic metabolic profile.  Atrial Fibrillation:  She has no overt symptoms with this and this.    She is not on anticoagulation because of a history of GI bleeding. No change in therapy is indicated.  Hypertension: This is being managed in the context of treating his CHF

## 2014-05-14 NOTE — Patient Instructions (Signed)
Your physician recommends that you have blood work done.  Dr Percival Spanish recommends that you schedule a follow-up appointment in 6 months. You will receive a reminder letter in the mail two months in advance. If you don't receive a letter, please call our office to schedule the follow-up appointment.

## 2014-05-30 ENCOUNTER — Other Ambulatory Visit: Payer: Self-pay | Admitting: *Deleted

## 2014-05-30 MED ORDER — HYDROCODONE-ACETAMINOPHEN 5-325 MG PO TABS: ORAL_TABLET | ORAL | Status: DC

## 2014-05-30 NOTE — Telephone Encounter (Signed)
[facility] 

## 2014-06-05 ENCOUNTER — Telehealth: Payer: Self-pay | Admitting: Cardiology

## 2014-06-05 NOTE — Telephone Encounter (Signed)
Daughter called and informed to make an appt. With Dr. Percival Spanish

## 2014-06-05 NOTE — Telephone Encounter (Addendum)
Pt c/o swelling: STAT is pt has developed SOB within 24 hours  1. How long have you been experiencing swelling? Past week (12 LBS WEIGHT GAIN)  2. Where is the swelling located? Thighs, knees, and legs, head and neck  3.  Are you currently taking a "fluid pill"? no  4.  Are you currently SOB? Yes   5.  Have you traveled recently?no  *DAUGHTER IS CALLING BECAUSE SHE WANTS HER TO BE SEEN ASAP , SHE IS CONCERNED*

## 2014-06-05 NOTE — Telephone Encounter (Signed)
New message         Pt has had tremendous weight gain in the last 5 days   pt has gained 12 pounds   open wound on left leg since Good Friday   please give daughter call

## 2014-06-05 NOTE — Telephone Encounter (Signed)
DAUGHTER CALLED. DAUGHTER IS CONCERNED ABOUT HER MOTHER.DAUGHTER STATES THE WEIGHT GAIN 12 LBS.,SHORT OF BREATH, SWELLING  PER DAUGHTER , SHE IS BEING WEIGHED 3 TIMES A DAY ( LIFT _ BY CNA) . RN ASKED WERE IS THE PATIENT LIVING. [name]- (UNABLE TO WALK -ANKLES BROKE PER DAUGHTER) DAUGHTER STATES SHE SPOKE TO ([name]) D.O.N OF THE FACILITY - ( [name].SHE STATES SHE SH HAS NOT HEARD FROM THEM  RN INFORMED DAUGHTER- WILL HAVE TO [name]- WILL HAVE TO CONTACT   RN CONTACTED [name]. SPOKE TO [name] (NURSE FOR THE EVENING) INFORMED HER THAT DAUGHTER HAD CALLED OFFICE CONCERNED  ABOUT [name].  [name] BEEN OBTAIN EARLIER CONCERNING THE PATIENT. RN ASKED IF ANYONE CONTACT DAUGHTER [name].  [name] THAT [name]  ( ASST. D.O.N.)----WILL CALL DAUGHTER.  RN LEFT MESSAGE ON SECURE VOICEMAIL TO [name] THAT SHE WILL BE RECEIVING A CALL.

## 2014-06-10 ENCOUNTER — Non-Acute Institutional Stay (SKILLED_NURSING_FACILITY): Payer: Medicare Other | Admitting: Internal Medicine

## 2014-06-10 ENCOUNTER — Encounter: Payer: Self-pay | Admitting: Internal Medicine

## 2014-06-10 DIAGNOSIS — R21 Rash and other nonspecific skin eruption: Secondary | ICD-10-CM

## 2014-06-10 DIAGNOSIS — I509 Heart failure, unspecified: Secondary | ICD-10-CM | POA: Diagnosis not present

## 2014-06-10 DIAGNOSIS — I110 Hypertensive heart disease with heart failure: Secondary | ICD-10-CM

## 2014-06-10 DIAGNOSIS — I5041 Acute combined systolic (congestive) and diastolic (congestive) heart failure: Secondary | ICD-10-CM | POA: Diagnosis not present

## 2014-06-10 DIAGNOSIS — I482 Chronic atrial fibrillation, unspecified: Secondary | ICD-10-CM

## 2014-06-10 NOTE — Assessment & Plan Note (Signed)
Pt is on low dose Bblocker, low dose ACE and demadex BID for control of BP and CHF

## 2014-06-10 NOTE — Progress Notes (Signed)
MRN: [id number] Name: [name]  Sex: female Age: 80 y.o. DOB: Dec 08, 1934  [place] #: Dareen Piano Facility/Room: [address] Level Of Care: SNF Provider: Inocencio Homes D Emergency Contacts: Extended Emergency Contact Information Primary Emergency Contact: Kochanowski,Reggie Address: [place], [place] [address] [place] Phone: [phone] Work Phone: [phone] Relation: Son Secondary Emergency Contact: Ruiter,[name]          [place], [place] Phone: [phone] Relation: Daughter  Code Status: FULL  Allergies: Levaquin; Lexapro; Penicillins; Tetanus-diphtheria toxoids td; and Latex  Chief Complaint  Patient presents with  . Acute Visit  . Medical Management of Chronic Issues    HPI: Patient is 80 y.o. female who I was asked to see for a rash on pt's buttocks reported by pt's daughter but reviewed pt and her chart while I was there.  Past Medical History  Diagnosis Date  . HTN (hypertension)   . Diverticulosis   . Hemorrhoid   . Osteoarthritis   . Constipation, chronic   . H/O: hysterectomy   . Systolic and diastolic CHF, chronic     a. Echo 07/2011: Technically limited; inferior HK, inferolateral and possibly lateral wall HK, EF 40%, moderate LVE, aortic sclerosis without stenosis, mild MR, mild LAE, question RV dysfunction;   b.  Echo (04/2013): EF 50-55%, normal wall motion, moderate MR, mild to moderate LAE, PASP 37  . Female bladder prolapse     with prolapse uterus, cystocele, s/p TVH/BSO 12/2006  . Obesity (BMI 30-39.9)     wt. 94.1 kg 12/2010  . Moderate mitral regurgitation     a. Mod to Sev by echo 01/2009;  b. mild MR by echo 2013  . Atrial fibrillation 10/13/2011    coumadin Rx  . Iron deficiency anemia   . Pneumonia   . Shortness of breath dyspnea   . GERD (gastroesophageal reflux disease)     Past Surgical History  Procedure Laterality Date  . Wrist surgery    . Vaginal  hysterectomy,cystocele and prolapse  repair  NOV. 2008  . Abdominal hysterectomy    . Colonoscopy N/A 07/18/2013    Procedure: COLONOSCOPY;  Surgeon: Wonda Horner, MD;  Location: [facility] ENDOSCOPY;  Service: Endoscopy;  Laterality: N/A;  . Flexible sigmoidoscopy N/A 03/08/2014    Procedure: FLEXIBLE SIGMOIDOSCOPY;  Surgeon: Lear Ng, MD;  Location: [facility] ENDOSCOPY;  Service: Endoscopy;  Laterality: N/A;  need hoyer lift      Medication List       This list is accurate as of: 06/10/14  8:03 PM.  Always use your most recent med list.               albuterol (2.5 MG/3ML) 0.083% nebulizer solution  Commonly known as:  PROVENTIL  Take 2.5 mg by nebulization every 8 (eight) hours as needed for wheezing or shortness of breath (For wheezing and shortness of breath).     bisacodyl 10 MG suppository  Commonly known as:  DULCOLAX  Place 10 mg rectally daily as needed for moderate constipation (if constipation not relieved by MOM).     carvedilol 3.125 MG tablet  Commonly known as:  COREG  Take 3.125 mg by mouth 2 (two) times daily with a meal. Hold if SBP <100     feeding supplement (PRO-STAT SUGAR FREE 64) Liqd  Take 30 mLs by mouth daily.     FERRETTS 325 (106 FE) MG Tabs tablet  Generic drug:  ferrous fumarate  Take 1 tablet by mouth 2 (two) times daily. 8am and 4pm for Iron Deficiency     HYDROcodone-acetaminophen 5-325 MG per tablet  Commonly known as:  NORCO/VICODIN  Take one tablet by mouth every 6 hours as needed for pain (control)     lisinopril 2.5 MG tablet  Commonly known as:  PRINIVIL,ZESTRIL  Take 2.5 mg by mouth daily.     LORazepam 0.5 MG tablet  Commonly known as:  ATIVAN  Take one tablet by mouth every 6 hours as needed for anxiety     lubiprostone 24 MCG capsule  Commonly known as:  AMITIZA  Take 1 capsule (24 mcg total) by mouth 2 (two) times daily with a meal.     magnesium hydroxide 400 MG/5ML suspension  Commonly known as:  MILK OF MAGNESIA  Take 30  mLs by mouth daily as needed for mild constipation.     metolazone 2.5 MG tablet  Commonly known as:  ZAROXOLYN  Take 2.5 mg by mouth once a week. Weekly on Wed     pantoprazole 40 MG tablet  Commonly known as:  PROTONIX  Take 1 tablet (40 mg total) by mouth 2 (two) times daily.     polyethylene glycol packet  Commonly known as:  MIRALAX / GLYCOLAX  Take 17 g by mouth daily.     predniSONE 20 MG tablet  Commonly known as:  DELTASONE  2 by mouth at breakfast for arthritis     promethazine 25 MG tablet  Commonly known as:  PHENERGAN  Take 25 mg by mouth every 6 (six) hours as needed for nausea or vomiting.     torsemide 20 MG tablet  Commonly known as:  DEMADEX  Take 20 mg by mouth 2 (two) times daily.     traMADol 50 MG tablet  Commonly known as:  ULTRAM  Take one tablet by mouth every 8 hours as needed for moderate pain     Vitamin D (Ergocalciferol) 50000 UNITS Caps capsule  Commonly known as:  DRISDOL  Take 50,000 Units by mouth every 7 (seven) days. On Sundays, for low vit D        No orders of the defined types were placed in this encounter.     There is no immunization history on file for this patient.  History  Substance Use Topics  . Smoking status: Never Smoker   . Smokeless tobacco: Former Systems developer    Types: Snuff  . Alcohol Use: No    Review of Systems  DATA OBTAINED: from patient, nurse, family member-daughter-, medical record GENERAL:  no fevers, fatigue, appetite changes SKIN: daughter says there is rash on mother's buttocks, a red line where the sheet has cut into skin and skin "popping out" 2/2 pt being in same position for 7 hours HEENT: No complaint RESPIRATORY: No cough, wheezing, SOB CARDIAC: No chest pain, palpitations, lower extremity edema  GI: No abdominal pain, No N/V/D or constipation, No heartburn or reflux  GU: No dysuria, frequency or urgency, or incontinence  MUSCULOSKELETAL: pt sometimes ir reported to say she hurts all over but  today I discussed pt getting out of bed NEUROLOGIC: No headache, dizziness  PSYCHIATRIC: No overt anxiety or sadness  Filed Vitals:   06/10/14 1126  BP: 117/77  Pulse: 82  Temp: 98 F (36.7 C)  Resp: 20    Physical Exam  GENERAL APPEARANCE: Alert, conversant, BF, No acute distress visited with pt again after her daughter  had departed SKIN: mild dark pink linear indention on skin of R buttocks that does look like a sheet line overlying old stretch marks that look normal  HEENT: Unremarkable RESPIRATORY: Breathing is even, unlabored. Lung sounds are clear   CARDIOVASCULAR: Heart RRR no murmurs, rubs or gallops. Trace-1+  peripheral edema  GASTROINTESTINAL: Abdomen is soft, non-tender, not distended w/ normal bowel sounds.  GENITOURINARY: Bladder non tender, not distended  MUSCULOSKELETAL: No abnormal joints or musculature NEUROLOGIC: Cranial nerves 2-12 grossly intact PSYCHIATRIC: Mood and affect appropriate to situation, no behavioral issues,  Patient Active Problem List   Diagnosis Date Noted  . Left tibial fracture 04/26/2014  . Vitamin D deficiency 03/26/2014  . Left fibular fracture 03/26/2014  . Chronic kidney disease (CKD), stage II (mild) 10/18/2013  . Chronic respiratory failure 08/28/2013  . Chronic diastolic heart failure 20/94/7096  . Immobility 08/15/2013  . Acute respiratory failure 08/13/2013  . GERD (gastroesophageal reflux disease) 08/13/2013  . CHF (congestive heart failure) 08/04/2013  . Acute combined systolic and diastolic congestive heart failure, NYHA class 4 08/04/2013  . GI bleed 07/13/2013  . History of lower GI bleeding 07/11/2013  . AKI (acute kidney injury) 06/29/2013  . Acute lower GI bleeding 06/23/2013  . PNA (pneumonia) 06/19/2013  . Rash and nonspecific skin eruption 06/19/2013  . Chronic respiratory failure with hypoxia 06/18/2013  . Acute encephalopathy 06/07/2013  . Diastolic CHF 28/36/6294  . Pulmonary hypertension 06/05/2013  .  Weakness 06/03/2013  . Encounter for therapeutic drug monitoring 04/10/2013  . Long term current use of anticoagulant 10/26/2011  . Hypokalemia 10/15/2011  . Abdominal pain 10/15/2011  . Iron deficiency anemia 10/15/2011  . Bladder prolapse, female, acquired 10/15/2011  . Atrial fibrillation 10/15/2011  . Acute on chronic combined systolic and diastolic congestive heart failure 07/22/2011  . Facial edema 12/30/2010  . Leukopenia 12/30/2010  . Neutropenia 12/30/2010  . Chronic pain 12/25/2010  . Hematochezia 12/25/2010  . Dehydration 12/25/2010  . Thrombocytosis 12/25/2010  . Hyponatremia 12/25/2010  . Chronic diastolic CHF (congestive heart failure), NYHA class 4   . Female bladder prolapse   . Mitral regurgitation 06/04/2010  . HYPOPOTASSEMIA 09/24/2009  . COMBINED HEART FAILURE, CHRONIC 09/09/2009  . CARDIOMYOPATHY 03/04/2009  . ANASARCA 03/04/2009  . Hypertensive heart disease with CHF 02/20/2009  . CONSTIPATION, CHRONIC 02/20/2009  . DIVERTICULOSIS 02/20/2009    CBC    Component Value Date/Time   WBC 10.7* 08/09/2013 0357   RBC 3.48* 08/09/2013 0357   RBC 1.95* 06/23/2013 1118   HGB 10.4* 08/09/2013 0357   HCT 33.4* 08/09/2013 0357   PLT 280 08/09/2013 0357   MCV 96.0 08/09/2013 0357   LYMPHSABS 1.3 08/05/2013 0800   MONOABS 0.4 08/05/2013 0800   EOSABS 0.1 08/05/2013 0800   BASOSABS 0.0 08/05/2013 0800    CMP     Component Value Date/Time   NA 130* 02/22/2014 1613   K 4.6 02/22/2014 1613   CL 93* 02/22/2014 1613   CO2 25 02/22/2014 1613   GLUCOSE 178* 02/22/2014 1613   BUN 43* 02/22/2014 1613   CREATININE 1.62* 02/22/2014 1613   CREATININE 1.06 11/28/2013 1110   CALCIUM 8.7 02/22/2014 1613   PROT 6.0 08/05/2013 0800   ALBUMIN 2.7* 08/05/2013 0800   AST 33 08/05/2013 0800   ALT 86* 08/05/2013 0800   ALKPHOS 120* 08/05/2013 0800   BILITOT 0.3 08/05/2013 0800   GFRNONAA 49* 11/28/2013 1110   GFRAA 57* 11/28/2013 1110    Assessment and Plan  RASH  -  is not a rash, but a linear skin mark from lying on a sheet fold and the skin "popping out" are normal looking stretch marks; this type of thing is typical of many of the numerous c/o by daughter concerning pt's care; however while daughter says she wants to take Mom home, pt does not  Acute combined systolic and diastolic congestive heart failure, NYHA class 4 Seen by Cardiology Dr Percival Spanish on 4/5 who felt that she was doing well and had never seen pt's pedal edema look so good. Pt had been without sx or ED visits for a good while. Pt is on bblocker, ACE and demadex   Atrial fibrillation Addressed with cards; no current sx;pt is on bblocker and on no anticoagulant 2/2 prior GI bleed   Hypertensive heart disease with CHF Pt is on low dose Bblocker, low dose ACE and demadex BID for control of BP and CHF     Hennie Duos, MD

## 2014-06-10 NOTE — Assessment & Plan Note (Signed)
Addressed with cards; no current sx;pt is on bblocker and on no anticoagulant 2/2 prior GI bleed

## 2014-06-10 NOTE — Assessment & Plan Note (Signed)
Seen by Cardiology Dr Percival Spanish on 4/5 who felt that she was doing well and had never seen pt's pedal edema look so good. Pt had been without sx or ED visits for a good while. Pt is on bblocker, ACE and demadex

## 2014-06-17 ENCOUNTER — Telehealth: Payer: Self-pay | Admitting: Cardiology

## 2014-06-17 ENCOUNTER — Encounter: Payer: Self-pay | Admitting: Internal Medicine

## 2014-06-17 ENCOUNTER — Non-Acute Institutional Stay (SKILLED_NURSING_FACILITY): Payer: Medicare Other | Admitting: Internal Medicine

## 2014-06-17 DIAGNOSIS — J189 Pneumonia, unspecified organism: Secondary | ICD-10-CM | POA: Diagnosis not present

## 2014-06-17 DIAGNOSIS — I5042 Chronic combined systolic (congestive) and diastolic (congestive) heart failure: Secondary | ICD-10-CM

## 2014-06-17 DIAGNOSIS — J181 Lobar pneumonia, unspecified organism: Principal | ICD-10-CM

## 2014-06-17 NOTE — Telephone Encounter (Signed)
Pt's daughter called in stating that she would like for her mother to be seen as soon as possible because she is having some SOB and now she has pneumonia in her lower left lung and still has some swelling in her legs. Please call  Thanks

## 2014-06-17 NOTE — Progress Notes (Signed)
MRN: [id number] Name: [name]  Sex: female Age: 80 y.o. DOB: 06-Oct-1934  [place] #: [name] Facility/Room: 801K Level Of Care: SNF Provider: Inocencio Homes D Emergency Contacts: Extended Emergency Contact Information Primary Emergency Contact: Howry,Reggie Address: [place], [place] [address] [place] Phone: [phone] Work Phone: [phone] Relation: Son Secondary Emergency Contact: Ruiter,[name]          [place], [place] Phone: [phone] Relation: Daughter  Code Status:FULL   Allergies: Levaquin; Lexapro; Penicillins; Tetanus-diphtheria toxoids td; and Latex  Chief Complaint  Patient presents with  . Acute Visit    HPI: Patient is 80 y.o. female who was noted on 5/8 to be wheezing with productive cough with green sputum.A CXR was ordered which showed a LLL infiltrate.  Past Medical History  Diagnosis Date  . HTN (hypertension)   . Diverticulosis   . Hemorrhoid   . Osteoarthritis   . Constipation, chronic   . H/O: hysterectomy   . Systolic and diastolic CHF, chronic     a. Echo 07/2011: Technically limited; inferior HK, inferolateral and possibly lateral wall HK, EF 40%, moderate LVE, aortic sclerosis without stenosis, mild MR, mild LAE, question RV dysfunction;   b.  Echo (04/2013): EF 50-55%, normal wall motion, moderate MR, mild to moderate LAE, PASP 37  . Female bladder prolapse     with prolapse uterus, cystocele, s/p TVH/BSO 12/2006  . Obesity (BMI 30-39.9)     wt. 94.1 kg 12/2010  . Moderate mitral regurgitation     a. Mod to Sev by echo 01/2009;  b. mild MR by echo 2013  . Atrial fibrillation 10/13/2011    coumadin Rx  . Iron deficiency anemia   . Pneumonia   . Shortness of breath dyspnea   . GERD (gastroesophageal reflux disease)     Past Surgical History  Procedure Laterality Date  . Wrist surgery    . Vaginal hysterectomy,cystocele and prolapse  repair  NOV. 2008  .  Abdominal hysterectomy    . Colonoscopy N/A 07/18/2013    Procedure: COLONOSCOPY;  Surgeon: Wonda Horner, MD;  Location: [facility] ENDOSCOPY;  Service: Endoscopy;  Laterality: N/A;  . Flexible sigmoidoscopy N/A 03/08/2014    Procedure: FLEXIBLE SIGMOIDOSCOPY;  Surgeon: Lear Ng, MD;  Location: [facility] ENDOSCOPY;  Service: Endoscopy;  Laterality: N/A;  need hoyer lift      Medication List       This list is accurate as of: 06/17/14 10:20 AM.  Always use your most recent med list.               albuterol (2.5 MG/3ML) 0.083% nebulizer solution  Commonly known as:  PROVENTIL  Take 2.5 mg by nebulization every 8 (eight) hours as needed for wheezing or shortness of breath (For wheezing and shortness of breath).     bisacodyl 10 MG suppository  Commonly known as:  DULCOLAX  Place 10 mg rectally daily as needed for moderate constipation (if constipation not relieved by MOM).     carvedilol 3.125 MG tablet  Commonly known as:  COREG  Take 3.125 mg by mouth 2 (two) times daily with a meal. Hold if SBP <100     feeding supplement (PRO-STAT SUGAR FREE 64) Liqd  Take 30 mLs by mouth daily.     FERRETTS 325 (106 FE) MG Tabs tablet  Generic drug:  ferrous fumarate  Take 1 tablet by mouth  2 (two) times daily. 8am and 4pm for Iron Deficiency     HYDROcodone-acetaminophen 5-325 MG per tablet  Commonly known as:  NORCO/VICODIN  Take one tablet by mouth every 6 hours as needed for pain (control)     lisinopril 2.5 MG tablet  Commonly known as:  PRINIVIL,ZESTRIL  Take 2.5 mg by mouth daily.     LORazepam 0.5 MG tablet  Commonly known as:  ATIVAN  Take one tablet by mouth every 6 hours as needed for anxiety     lubiprostone 24 MCG capsule  Commonly known as:  AMITIZA  Take 1 capsule (24 mcg total) by mouth 2 (two) times daily with a meal.     magnesium hydroxide 400 MG/5ML suspension  Commonly known as:  MILK OF MAGNESIA  Take 30 mLs by mouth daily as needed for mild constipation.      metolazone 2.5 MG tablet  Commonly known as:  ZAROXOLYN  Take 2.5 mg by mouth once a week. Weekly on Wed     pantoprazole 40 MG tablet  Commonly known as:  PROTONIX  Take 1 tablet (40 mg total) by mouth 2 (two) times daily.     polyethylene glycol packet  Commonly known as:  MIRALAX / GLYCOLAX  Take 17 g by mouth daily.     predniSONE 20 MG tablet  Commonly known as:  DELTASONE  2 by mouth at breakfast for arthritis     promethazine 25 MG tablet  Commonly known as:  PHENERGAN  Take 25 mg by mouth every 6 (six) hours as needed for nausea or vomiting.     torsemide 20 MG tablet  Commonly known as:  DEMADEX  Take 20 mg by mouth 2 (two) times daily.     traMADol 50 MG tablet  Commonly known as:  ULTRAM  Take one tablet by mouth every 8 hours as needed for moderate pain     Vitamin D (Ergocalciferol) 50000 UNITS Caps capsule  Commonly known as:  DRISDOL  Take 50,000 Units by mouth every 7 (seven) days. On Sundays, for low vit D        No orders of the defined types were placed in this encounter.     There is no immunization history on file for this patient.  History  Substance Use Topics  . Smoking status: Never Smoker   . Smokeless tobacco: Former Systems developer    Types: Snuff  . Alcohol Use: No    Review of Systems  DATA OBTAINED: from patient, nurse, medical record GENERAL:  no fevers SKIN: No  rash HEENT: No complaint RESPIRATORY: + cough,+ wheezing, SOB CARDIAC: No chest pain, palpitations, lower extremity edema  GI: No abdominal pain, No N/V/D or constipation, No heartburn or reflux  GU: No dysuria, frequency or urgency, or incontinence  MUSCULOSKELETAL: No unrelieved bone/joint pain NEUROLOGIC: No headache, dizziness  PSYCHIATRIC: No overt anxiety or sadness  Filed Vitals:   06/17/14 1012  BP: 116/76  Pulse: 92  Temp: 98.1 F (36.7 C)  Resp: 18    Physical Exam  GENERAL APPEARANCE: Obese BF, No acute distress ,looks sick, not toxic  SKIN: No  diaphoresis rash HEENT: Unremarkable RESPIRATORY: Breathing is even, unlabored. Lung sounds are diffusely decreased, no wheezes, no rales CARDIOVASCULAR: Heart RRR no murmurs, rubs or gallo;p trace peripheral edema  GASTROINTESTINAL: Abdomen is soft, non-tender, not distended w/ normal bowel sounds.  GENITOURINARY: Bladder non tender, not distended  MUSCULOSKELETAL: No abnormal joints or musculature NEUROLOGIC: Cranial nerves 2-12 grossly  intact. Moves all extremities PSYCHIATRIC:  no behavioral issues  Patient Active Problem List   Diagnosis Date Noted  . Left tibial fracture 04/26/2014  . Vitamin D deficiency 03/26/2014  . Left fibular fracture 03/26/2014  . Chronic kidney disease (CKD), stage II (mild) 10/18/2013  . Chronic respiratory failure 08/28/2013  . Chronic diastolic heart failure 48/18/5631  . Immobility 08/15/2013  . Acute respiratory failure 08/13/2013  . GERD (gastroesophageal reflux disease) 08/13/2013  . CHF (congestive heart failure) 08/04/2013  . Acute combined systolic and diastolic congestive heart failure, NYHA class 4 08/04/2013  . GI bleed 07/13/2013  . History of lower GI bleeding 07/11/2013  . AKI (acute kidney injury) 06/29/2013  . Acute lower GI bleeding 06/23/2013  . PNA (pneumonia) 06/19/2013  . Rash and nonspecific skin eruption 06/19/2013  . Chronic respiratory failure with hypoxia 06/18/2013  . Acute encephalopathy 06/07/2013  . Diastolic CHF 49/70/2637  . Pulmonary hypertension 06/05/2013  . Weakness 06/03/2013  . Encounter for therapeutic drug monitoring 04/10/2013  . Long term current use of anticoagulant 10/26/2011  . Hypokalemia 10/15/2011  . Abdominal pain 10/15/2011  . Iron deficiency anemia 10/15/2011  . Bladder prolapse, female, acquired 10/15/2011  . Atrial fibrillation 10/15/2011  . Acute on chronic combined systolic and diastolic congestive heart failure 07/22/2011  . Facial edema 12/30/2010  . Leukopenia 12/30/2010  .  Neutropenia 12/30/2010  . Chronic pain 12/25/2010  . Hematochezia 12/25/2010  . Dehydration 12/25/2010  . Thrombocytosis 12/25/2010  . Hyponatremia 12/25/2010  . Chronic diastolic CHF (congestive heart failure), NYHA class 4   . Female bladder prolapse   . Mitral regurgitation 06/04/2010  . HYPOPOTASSEMIA 09/24/2009  . COMBINED HEART FAILURE, CHRONIC 09/09/2009  . CARDIOMYOPATHY 03/04/2009  . ANASARCA 03/04/2009  . Hypertensive heart disease with CHF 02/20/2009  . CONSTIPATION, CHRONIC 02/20/2009  . DIVERTICULOSIS 02/20/2009    CBC    Component Value Date/Time   WBC 10.7* 08/09/2013 0357   RBC 3.48* 08/09/2013 0357   RBC 1.95* 06/23/2013 1118   HGB 10.4* 08/09/2013 0357   HCT 33.4* 08/09/2013 0357   PLT 280 08/09/2013 0357   MCV 96.0 08/09/2013 0357   LYMPHSABS 1.3 08/05/2013 0800   MONOABS 0.4 08/05/2013 0800   EOSABS 0.1 08/05/2013 0800   BASOSABS 0.0 08/05/2013 0800    CMP     Component Value Date/Time   NA 130* 02/22/2014 1613   K 4.6 02/22/2014 1613   CL 93* 02/22/2014 1613   CO2 25 02/22/2014 1613   GLUCOSE 178* 02/22/2014 1613   BUN 43* 02/22/2014 1613   CREATININE 1.62* 02/22/2014 1613   CREATININE 1.06 11/28/2013 1110   CALCIUM 8.7 02/22/2014 1613   PROT 6.0 08/05/2013 0800   ALBUMIN 2.7* 08/05/2013 0800   AST 33 08/05/2013 0800   ALT 86* 08/05/2013 0800   ALKPHOS 120* 08/05/2013 0800   BILITOT 0.3 08/05/2013 0800   GFRNONAA 49* 11/28/2013 1110   GFRAA 57* 11/28/2013 1110    Assessment and Plan  No problem-specific assessment & plan notes found for this encounter.   Hennie Duos, MD

## 2014-06-17 NOTE — Assessment & Plan Note (Signed)
LLL infiltrate dx 5/8; pt on O2 chronically, started on Rocephin 1 gm I daily for 7 days-she has taken this before and floraster for 2 weeks. Will order scheduled nebs for 2 weeks and decrease demadex to daily for 1 week while pt acutely ill - she has gotten dry before when she was ill.

## 2014-06-17 NOTE — Telephone Encounter (Signed)
Pt. Has appt with [place] PA may 11th and has been informed

## 2014-06-17 NOTE — Telephone Encounter (Signed)
Closed encouner

## 2014-06-17 NOTE — Assessment & Plan Note (Signed)
Pt does not appear to be volume overloaded at this time. Will dec demadex to daily for a week then back to BID;last PNA pt got dehydrated.

## 2014-06-19 ENCOUNTER — Ambulatory Visit (INDEPENDENT_AMBULATORY_CARE_PROVIDER_SITE_OTHER): Payer: Medicare Other | Admitting: Cardiology

## 2014-06-19 ENCOUNTER — Encounter: Payer: Self-pay | Admitting: Cardiology

## 2014-06-19 VITALS — BP 132/68 | HR 96 | Ht 63.0 in | Wt 231.2 lb

## 2014-06-19 DIAGNOSIS — I482 Chronic atrial fibrillation, unspecified: Secondary | ICD-10-CM

## 2014-06-19 NOTE — Progress Notes (Signed)
06/19/2014 [name]   02/25/1934  [id number]  Primary Physician Osborne Casco, MD Primary Cardiologist: Dr. Percival Spanish  Reason for Visit/CC: SOB  HPI:  The patient is a 80 y/o female with a h/o chronic combined systolic + diastolic HF, on chronic home O2, HTN and atrial fibrillation. She is not on oral anticoagulation due to h/o GIB. She is followed by Dr. Percival Spanish and resides at a nursing home. She was recently diagnosed with LLL PNA (confirmed by CXR) on 06/17/14 and placed on antibiotics, Rocephin x 7 days + nebulizers. Per nursing home records, her Demadex was decreased from BID to daily x 1 week "while patient is acutely ill". There was a note stating that the last time the patient had PNA, she got dehydrated. Her daughter called the office on 5/9 requesting an appointment for SOB and LEE.  She is accompanied to clinic today by her daughter. They both note improvement in symptoms since the time the appointment was made. Her breathing has returned to baseline. Her LEE has resolved. Her weight is back to baseline at 231 lb (was 240 last week). She feels the antibiotics are helping her. She reports improvement in cough. No wheezing, fever of chills. No chest pain or palpitations.  EKG shows Afib with a CVR of 96 bpm.   Current Outpatient Prescriptions  Medication Sig Dispense Refill  . albuterol (PROVENTIL) (2.5 MG/3ML) 0.083% nebulizer solution Take 2.5 mg by nebulization every 8 (eight) hours as needed for wheezing or shortness of breath (For wheezing and shortness of breath).     . Amino Acids-Protein Hydrolys (FEEDING SUPPLEMENT, PRO-STAT SUGAR FREE 64,) LIQD Take 30 mLs by mouth daily.     . bisacodyl (DULCOLAX) 10 MG suppository Place 10 mg rectally daily as needed for moderate constipation (if constipation not relieved by MOM).    . carvedilol (COREG) 3.125 MG tablet Take 3.125 mg by mouth 2 (two) times daily with a meal. Hold if SBP <100    . ferrous fumarate (FERRETTS)  325 (106 FE) MG TABS Take 1 tablet by mouth 2 (two) times daily. 8am and 4pm for Iron Deficiency    . HYDROcodone-acetaminophen (NORCO/VICODIN) 5-325 MG per tablet Take one tablet by mouth every 6 hours as needed for pain (control) 120 tablet 0  . lisinopril (PRINIVIL,ZESTRIL) 2.5 MG tablet Take 2.5 mg by mouth daily.    Marland Kitchen LORazepam (ATIVAN) 0.5 MG tablet Take one tablet by mouth every 6 hours as needed for anxiety 120 tablet 5  . lubiprostone (AMITIZA) 24 MCG capsule Take 1 capsule (24 mcg total) by mouth 2 (two) times daily with a meal. (Patient taking differently: Take 24 mcg by mouth 2 (two) times daily with a meal. For constipation) 30 capsule 0  . magnesium hydroxide (MILK OF MAGNESIA) 400 MG/5ML suspension Take 30 mLs by mouth daily as needed for mild constipation.    . metolazone (ZAROXOLYN) 2.5 MG tablet Take 2.5 mg by mouth once a week. Weekly on Wed    . pantoprazole (PROTONIX) 40 MG tablet Take 1 tablet (40 mg total) by mouth 2 (two) times daily. (Patient taking differently: Take 40 mg by mouth 2 (two) times daily. For GERD)    . polyethylene glycol (MIRALAX / GLYCOLAX) packet Take 17 g by mouth daily.    Marland Kitchen POTASSIUM CHLORIDE PO Take 40 mEq by mouth 2 (two) times daily.    . predniSONE (DELTASONE) 20 MG tablet 2 by mouth at breakfast for arthritis    .  promethazine (PHENERGAN) 25 MG tablet Take 25 mg by mouth every 6 (six) hours as needed for nausea or vomiting.    . torsemide (DEMADEX) 20 MG tablet Take 20 mg by mouth 2 (two) times daily.     . traMADol (ULTRAM) 50 MG tablet Take one tablet by mouth every 8 hours as needed for moderate pain 90 tablet 5  . Vitamin D, Ergocalciferol, (DRISDOL) 50000 UNITS CAPS capsule Take 50,000 Units by mouth every 7 (seven) days. On Sundays, for low vit D     No current facility-administered medications for this visit.    Allergies  Allergen Reactions  . Levaquin [Levofloxacin] Other (See Comments)    Per MAR  . Lexapro [Escitalopram Oxalate]  Swelling  . Penicillins Other (See Comments)    Per MAR  . Tetanus-Diphtheria Toxoids Td Other (See Comments)    Per MAR  . Latex Hives and Rash    History   Social History  . Marital Status: Single    Spouse Name: N/A  . Number of Children: N/A  . Years of Education: N/A   Occupational History  . retired Gaffer    Social History Main Topics  . Smoking status: Never Smoker   . Smokeless tobacco: Former Systems developer    Types: Snuff  . Alcohol Use: No  . Drug Use: No  . Sexual Activity: Not Currently   Other Topics Concern  . Not on file   Social History Narrative   Lives in [place] by herself.  Her dtr lives near by and prepares all of her meals and leaves them for the pt to heat up in the microwave.  Dtr pays close attn to salt in meals.  Pt is sedentary.  Most activity is moving from couch to bed.  She ambulates w/ a walker.  Wt is up from 217 in January to 244 now.     Review of Systems: General: negative for chills, fever, night sweats or weight changes.  Cardiovascular: negative for chest pain, dyspnea on exertion, edema, orthopnea, palpitations, paroxysmal nocturnal dyspnea or shortness of breath Dermatological: negative for rash Respiratory: negative for cough or wheezing Urologic: negative for hematuria Abdominal: negative for nausea, vomiting, diarrhea, bright red blood per rectum, melena, or hematemesis Neurologic: negative for visual changes, syncope, or dizziness All other systems reviewed and are otherwise negative except as noted above.    Blood pressure 132/68, pulse 96, height 5\' 3"  (1.6 m), weight 231 lb 3.2 oz (104.872 kg).  General appearance: alert, cooperative and no distress Neck: no carotid bruit and no JVD Lungs: clear to auscultation bilaterally Heart: irregularly irregular rhythm Extremities: no LEE Pulses: 2+ and symmetric Skin: warm and dry Neurologic: Grossly normal  EKG Atrial Fibrillation with a CVR 96 bpm  ASSESSMENT AND PLAN:    1. SOB: resolved and back to baseline.  Likely from PNA which has improved since starting her antibiotics.   2. Combined Systolic + Diastolic CHF: stable. Dyspnea improved and LEE resolved. Weight is back to her baseline of 230 lb (was 240 last week). Euvolemic on physical exam. Continue diuretic, BB and ACE-I. Continue daily weights and low sodium diet.   3. LLL PNA: continue antibiotics + nebs as prescribed by Dr. Sheppard Coil at nursing facility. I recommended that they use Xopenex instead of albuterol to prevent reflex tachycardia given her afib. F/u as planned.  4. Atrial Fibrillation: Rate is controlled on BB therapy. Not a candidate for a/c due to h/o GIB  5. HTN:  BP is well controlled on current regimen at 132/68  PLAN  F/u with Dr. Percival Spanish in 6 months or sooner if needed.   Nareh Matzke, [place] 06/19/2014 8:18 AM

## 2014-06-19 NOTE — Patient Instructions (Signed)
Your physician recommends that you schedule a follow-up appointment you will be call in October for follow-up appointment

## 2014-06-24 ENCOUNTER — Telehealth: Payer: Self-pay | Admitting: Cardiology

## 2014-06-24 NOTE — Telephone Encounter (Signed)
Pt's daughter called in wanting to speak with Dr. Percival Spanish about her mother's condition. She states that the pt had experienced a second fall, she has some yellowing of the eyes, etc.. Please f/u with the pt's daughter  Thanks

## 2014-06-24 NOTE — Telephone Encounter (Signed)
Spoke with pt dtr, she is concerned about the patients current condition. There are a lot os issues eye infection, ear infection, low potassium, yelling out in her sleep, yellow skin and a wound on the patients left leg. She is trying to get the patient to the ER for evaluation, but when they ask the patient she will not go. Explained we can see most of the records from [facility] and the last potassium was normal. Apparently they checked labs over the weekend. Explained I am not sure how we can help if the patient will not go to the ER. Patient daughter voiced understanding She wants to make sure dr hochrein know what is going on. Will forward to dr hochrein

## 2014-07-01 NOTE — Telephone Encounter (Signed)
Has this been addressed?

## 2014-07-04 ENCOUNTER — Non-Acute Institutional Stay (SKILLED_NURSING_FACILITY): Payer: Medicare Other | Admitting: Internal Medicine

## 2014-07-04 ENCOUNTER — Other Ambulatory Visit: Payer: Self-pay | Admitting: *Deleted

## 2014-07-04 ENCOUNTER — Encounter: Payer: Self-pay | Admitting: Internal Medicine

## 2014-07-04 DIAGNOSIS — B370 Candidal stomatitis: Secondary | ICD-10-CM | POA: Insufficient documentation

## 2014-07-04 MED ORDER — HYDROCODONE-ACETAMINOPHEN 5-325 MG PO TABS: ORAL_TABLET | ORAL | Status: DC

## 2014-07-04 NOTE — Assessment & Plan Note (Addendum)
Mouth and at least throat involved;diflucan 100 mg 2 po day 1 then 1 po daily for 14 days.

## 2014-07-04 NOTE — Telephone Encounter (Signed)
[facility] 

## 2014-07-04 NOTE — Progress Notes (Signed)
MRN: [id number] Name: [name]  Sex: female Age: 80 y.o. DOB: 1934-04-07  [place] [facility] Facility/Room:305B Level Of Care: SNF Provider: Inocencio Homes D Emergency Contacts: Extended Emergency Contact Information Primary Emergency Contact: Cornia,Reggie Address: [place], [place] [address] [place] Phone: [phone] Work Phone: [phone] Relation: Son Secondary Emergency Contact: Ruiter,[name]          Lady [place], [place] Phone: [phone] Relation: Daughter  Code Status: FULL  Allergies: Levaquin; Lexapro; Penicillins; Tetanus-diphtheria toxoids td; and Latex  Chief Complaint  Patient presents with  . Acute Visit    HPI: Patient is 80 y.o. female who nursing asked me to see for c/o sore mouth. Pt admits mouth raw and some pain with swallowing as well for several days.  Past Medical History  Diagnosis Date  . HTN (hypertension)   . Diverticulosis   . Hemorrhoid   . Osteoarthritis   . Constipation, chronic   . H/O: hysterectomy   . Systolic and diastolic CHF, chronic     a. Echo 07/2011: Technically limited; inferior HK, inferolateral and possibly lateral wall HK, EF 40%, moderate LVE, aortic sclerosis without stenosis, mild MR, mild LAE, question RV dysfunction;   b.  Echo (04/2013): EF 50-55%, normal wall motion, moderate MR, mild to moderate LAE, PASP 37  . Female bladder prolapse     with prolapse uterus, cystocele, s/p TVH/BSO 12/2006  . Obesity (BMI 30-39.9)     wt. 94.1 kg 12/2010  . Moderate mitral regurgitation     a. Mod to Sev by echo 01/2009;  b. mild MR by echo 2013  . Atrial fibrillation 10/13/2011    coumadin Rx  . Iron deficiency anemia   . Pneumonia   . Shortness of breath dyspnea   . GERD (gastroesophageal reflux disease)     Past Surgical History  Procedure Laterality Date  . Wrist surgery    . Vaginal hysterectomy,cystocele and prolapse  repair  NOV. 2008  .  Abdominal hysterectomy    . Colonoscopy N/A 07/18/2013    Procedure: COLONOSCOPY;  Surgeon: Wonda Horner, MD;  Location: [facility] ENDOSCOPY;  Service: Endoscopy;  Laterality: N/A;  . Flexible sigmoidoscopy N/A 03/08/2014    Procedure: FLEXIBLE SIGMOIDOSCOPY;  Surgeon: Lear Ng, MD;  Location: [facility] ENDOSCOPY;  Service: Endoscopy;  Laterality: N/A;  need hoyer lift      Medication List       This list is accurate as of: 07/04/14  6:15 PM.  Always use your most recent med list.               albuterol (2.5 MG/3ML) 0.083% nebulizer solution  Commonly known as:  PROVENTIL  Take 2.5 mg by nebulization every 8 (eight) hours as needed for wheezing or shortness of breath (For wheezing and shortness of breath).     bisacodyl 10 MG suppository  Commonly known as:  DULCOLAX  Place 10 mg rectally daily as needed for moderate constipation (if constipation not relieved by MOM).     carvedilol 3.125 MG tablet  Commonly known as:  COREG  Take 3.125 mg by mouth 2 (two) times daily with a meal. Hold if SBP <100     feeding supplement (PRO-STAT SUGAR FREE 64) Liqd  Take 30 mLs by mouth daily.     FERRETTS 325 (106 FE) MG Tabs tablet  Generic drug:  ferrous fumarate  Take 1 tablet  by mouth 2 (two) times daily. 8am and 4pm for Iron Deficiency     HYDROcodone-acetaminophen 5-325 MG per tablet  Commonly known as:  NORCO/VICODIN  Take one tablet by mouth every 6 hours as needed for pain (control)     lisinopril 2.5 MG tablet  Commonly known as:  PRINIVIL,ZESTRIL  Take 2.5 mg by mouth daily.     LORazepam 0.5 MG tablet  Commonly known as:  ATIVAN  Take one tablet by mouth every 6 hours as needed for anxiety     lubiprostone 24 MCG capsule  Commonly known as:  AMITIZA  Take 1 capsule (24 mcg total) by mouth 2 (two) times daily with a meal.     magnesium hydroxide 400 MG/5ML suspension  Commonly known as:  MILK OF MAGNESIA  Take 30 mLs by mouth daily as needed for mild constipation.      metolazone 2.5 MG tablet  Commonly known as:  ZAROXOLYN  Take 2.5 mg by mouth once a week. Weekly on Wed     pantoprazole 40 MG tablet  Commonly known as:  PROTONIX  Take 1 tablet (40 mg total) by mouth 2 (two) times daily.     polyethylene glycol packet  Commonly known as:  MIRALAX / GLYCOLAX  Take 17 g by mouth daily.     POTASSIUM CHLORIDE PO  Take 40 mEq by mouth 2 (two) times daily.     predniSONE 20 MG tablet  Commonly known as:  DELTASONE  2 by mouth at breakfast for arthritis     promethazine 25 MG tablet  Commonly known as:  PHENERGAN  Take 25 mg by mouth every 6 (six) hours as needed for nausea or vomiting.     torsemide 20 MG tablet  Commonly known as:  DEMADEX  Take 20 mg by mouth 2 (two) times daily.     traMADol 50 MG tablet  Commonly known as:  ULTRAM  Take one tablet by mouth every 8 hours as needed for moderate pain     Vitamin D (Ergocalciferol) 50000 UNITS Caps capsule  Commonly known as:  DRISDOL  Take 50,000 Units by mouth every 7 (seven) days. On Sundays, for low vit D        No orders of the defined types were placed in this encounter.     There is no immunization history on file for this patient.  History  Substance Use Topics  . Smoking status: Never Smoker   . Smokeless tobacco: Former Systems developer    Types: Snuff  . Alcohol Use: No    Review of Systems  DATA OBTAINED: from patient, nurse GENERAL:  no fevers, fatigue, appetite changes SKIN: No itching, rash HEENT: No complaint RESPIRATORY: No cough, wheezing, SOB CARDIAC: No chest pain, palpitations, lower extremity edema  GI: No abdominal pain, No N/V/D or constipation, No heartburn or reflux ; per HPI GU: No dysuria, frequency or urgency, or incontinence  MUSCULOSKELETAL: No unrelieved bone/joint pain NEUROLOGIC: No headache, dizziness  PSYCHIATRIC: No overt anxiety or sadness  Filed Vitals:   07/04/14 1813  BP: 122/72  Pulse: 66  Temp: 99.5 F (37.5 C)  Resp: 18     Physical Exam  GENERAL APPEARANCE: Alert, conversant, No acute distress  SKIN: No diaphoresis rash HEENT:tongue red, with white plaque RESPIRATORY: Breathing is even, unlabored. Lung sounds are without wheezes  CARDIOVASCULAR: Heart RRR no murmurs, rubs or gallops GASTROINTESTINAL: Abdomen is soft, non-tender, not distended w/ normal bowel sounds.  GENITOURINARY: Bladder non tender,  not distended  MUSCULOSKELETAL: No abnormal joints or musculature NEUROLOGIC: Cranial nerves 2-12 grossly intact PSYCHIATRIC: Mood and affect appropriate to situation, no behavioral issues  Patient Active Problem List   Diagnosis Date Noted  . Thrush 07/04/2014  . Left tibial fracture 04/26/2014  . Vitamin D deficiency 03/26/2014  . Left fibular fracture 03/26/2014  . Chronic kidney disease (CKD), stage II (mild) 10/18/2013  . Chronic respiratory failure 08/28/2013  . Chronic diastolic heart failure 65/99/3570  . Immobility 08/15/2013  . Acute respiratory failure 08/13/2013  . GERD (gastroesophageal reflux disease) 08/13/2013  . CHF (congestive heart failure) 08/04/2013  . Acute combined systolic and diastolic congestive heart failure, NYHA class 4 08/04/2013  . GI bleed 07/13/2013  . History of lower GI bleeding 07/11/2013  . AKI (acute kidney injury) 06/29/2013  . Acute lower GI bleeding 06/23/2013  . PNA (pneumonia) 06/19/2013  . Rash and nonspecific skin eruption 06/19/2013  . Chronic respiratory failure with hypoxia 06/18/2013  . Acute encephalopathy 06/07/2013  . Diastolic CHF 17/79/3903  . Pulmonary hypertension 06/05/2013  . Weakness 06/03/2013  . Encounter for therapeutic drug monitoring 04/10/2013  . Long term current use of anticoagulant 10/26/2011  . Hypokalemia 10/15/2011  . Abdominal pain 10/15/2011  . Iron deficiency anemia 10/15/2011  . Bladder prolapse, female, acquired 10/15/2011  . Atrial fibrillation 10/15/2011  . Acute on chronic combined systolic and diastolic  congestive heart failure 07/22/2011  . Facial edema 12/30/2010  . Leukopenia 12/30/2010  . Neutropenia 12/30/2010  . Chronic pain 12/25/2010  . Hematochezia 12/25/2010  . Dehydration 12/25/2010  . Thrombocytosis 12/25/2010  . Hyponatremia 12/25/2010  . Chronic diastolic CHF (congestive heart failure), NYHA class 4   . Female bladder prolapse   . Mitral regurgitation 06/04/2010  . HYPOPOTASSEMIA 09/24/2009  . Chronic combined systolic and diastolic congestive heart failure 09/09/2009  . CARDIOMYOPATHY 03/04/2009  . ANASARCA 03/04/2009  . Hypertensive heart disease with CHF 02/20/2009  . CONSTIPATION, CHRONIC 02/20/2009  . DIVERTICULOSIS 02/20/2009    CBC    Component Value Date/Time   WBC 10.7* 08/09/2013 0357   RBC 3.48* 08/09/2013 0357   RBC 1.95* 06/23/2013 1118   HGB 10.4* 08/09/2013 0357   HCT 33.4* 08/09/2013 0357   PLT 280 08/09/2013 0357   MCV 96.0 08/09/2013 0357   LYMPHSABS 1.3 08/05/2013 0800   MONOABS 0.4 08/05/2013 0800   EOSABS 0.1 08/05/2013 0800   BASOSABS 0.0 08/05/2013 0800    CMP     Component Value Date/Time   NA 130* 02/22/2014 1613   K 4.6 02/22/2014 1613   CL 93* 02/22/2014 1613   CO2 25 02/22/2014 1613   GLUCOSE 178* 02/22/2014 1613   BUN 43* 02/22/2014 1613   CREATININE 1.62* 02/22/2014 1613   CREATININE 1.06 11/28/2013 1110   CALCIUM 8.7 02/22/2014 1613   PROT 6.0 08/05/2013 0800   ALBUMIN 2.7* 08/05/2013 0800   AST 33 08/05/2013 0800   ALT 86* 08/05/2013 0800   ALKPHOS 120* 08/05/2013 0800   BILITOT 0.3 08/05/2013 0800   GFRNONAA 49* 11/28/2013 1110   GFRAA 57* 11/28/2013 1110    Assessment and Plan  Thrush Mouth and at least throat involved;diflucan 100 mg 2 po day 1 then 1 po daily for 14 days.     Hennie Duos, MD

## 2014-07-25 ENCOUNTER — Encounter: Payer: Self-pay | Admitting: Internal Medicine

## 2014-07-25 ENCOUNTER — Non-Acute Institutional Stay (SKILLED_NURSING_FACILITY): Payer: Medicare Other | Admitting: Internal Medicine

## 2014-07-25 DIAGNOSIS — I5041 Acute combined systolic (congestive) and diastolic (congestive) heart failure: Secondary | ICD-10-CM | POA: Diagnosis not present

## 2014-07-25 DIAGNOSIS — E119 Type 2 diabetes mellitus without complications: Secondary | ICD-10-CM | POA: Insufficient documentation

## 2014-07-25 NOTE — Assessment & Plan Note (Addendum)
In 05/2013 BNP was 27,000. Today at baseline clinically, checking BNP for baseline value

## 2014-07-25 NOTE — Assessment & Plan Note (Addendum)
NEW DX, at least as long as I have known her. On no meds for same.  A1c in 05/2013 was 5.6. In past 4-5 days BS in 300-500 Obtaining A1c FLP, TSH, CMP. Starting  ac and qHS with SSI coverage to start. Pt has never before has high BS with infection prior so checking CBC but clinically don't think that is problem

## 2014-07-25 NOTE — Progress Notes (Signed)
MRN: [id number] Name: [name]  Sex: female Age: 80 y.o. DOB: 08-12-1934  [place] #: [name] Facility/Room: 305 Level Of Care: SNF Provider: Inocencio Homes D Emergency Contacts: Extended Emergency Contact Information Primary Emergency Contact: Hollin,Reggie Address: [place], [place] [address] [place] Phone: [phone] Work Phone: [phone] Relation: Son Secondary Emergency Contact: Ruiter,[name]          Lady [place], [place] of [place] Phone: [phone] Relation: Daughter  Code Status: FULL  Allergies: Levaquin; Lexapro; Penicillins; Tetanus-diphtheria toxoids td; and Latex  Chief Complaint  Patient presents with  . Acute Visit    HPI: Patient is 80 y.o. female who nursing has asked me to see for BS running high in past few days.  Past Medical History  Diagnosis Date  . HTN (hypertension)   . Diverticulosis   . Hemorrhoid   . Osteoarthritis   . Constipation, chronic   . H/O: hysterectomy   . Systolic and diastolic CHF, chronic     a. Echo 07/2011: Technically limited; inferior HK, inferolateral and possibly lateral wall HK, EF 40%, moderate LVE, aortic sclerosis without stenosis, mild MR, mild LAE, question RV dysfunction;   b.  Echo (04/2013): EF 50-55%, normal wall motion, moderate MR, mild to moderate LAE, PASP 37  . Female bladder prolapse     with prolapse uterus, cystocele, s/p TVH/BSO 12/2006  . Obesity (BMI 30-39.9)     wt. 94.1 kg 12/2010  . Moderate mitral regurgitation     a. Mod to Sev by echo 01/2009;  b. mild MR by echo 2013  . Atrial fibrillation 10/13/2011    coumadin Rx  . Iron deficiency anemia   . Pneumonia   . Shortness of breath dyspnea   . GERD (gastroesophageal reflux disease)   . DM type 2 (diabetes mellitus, type 2) 07/25/2014    Past Surgical History  Procedure Laterality Date  . Wrist surgery    . Vaginal hysterectomy,cystocele and prolapse  repair  NOV. 2008  .  Abdominal hysterectomy    . Colonoscopy N/A 07/18/2013    Procedure: COLONOSCOPY;  Surgeon: Wonda Horner, MD;  Location: [facility] ENDOSCOPY;  Service: Endoscopy;  Laterality: N/A;  . Flexible sigmoidoscopy N/A 03/08/2014    Procedure: FLEXIBLE SIGMOIDOSCOPY;  Surgeon: Lear Ng, MD;  Location: [facility] ENDOSCOPY;  Service: Endoscopy;  Laterality: N/A;  need hoyer lift      Medication List       This list is accurate as of: 07/25/14 11:59 PM.  Always use your most recent med list.               albuterol (2.5 MG/3ML) 0.083% nebulizer solution  Commonly known as:  PROVENTIL  Take 2.5 mg by nebulization every 8 (eight) hours as needed for wheezing or shortness of breath (For wheezing and shortness of breath).     bisacodyl 10 MG suppository  Commonly known as:  DULCOLAX  Place 10 mg rectally daily as needed for moderate constipation (if constipation not relieved by MOM).     carvedilol 3.125 MG tablet  Commonly known as:  COREG  Take 3.125 mg by mouth 2 (two) times daily with a meal. Hold if SBP <100     feeding supplement (PRO-STAT SUGAR FREE 64) Liqd  Take 30 mLs by mouth daily.     FERRETTS 325 (106 FE) MG Tabs tablet  Generic drug:  ferrous fumarate  Take 1  tablet by mouth 2 (two) times daily. 8am and 4pm for Iron Deficiency     HYDROcodone-acetaminophen 5-325 MG per tablet  Commonly known as:  NORCO/VICODIN  Take one tablet by mouth every 6 hours as needed for pain (control)     lisinopril 2.5 MG tablet  Commonly known as:  PRINIVIL,ZESTRIL  Take 2.5 mg by mouth daily.     LORazepam 0.5 MG tablet  Commonly known as:  ATIVAN  Take one tablet by mouth every 6 hours as needed for anxiety     lubiprostone 24 MCG capsule  Commonly known as:  AMITIZA  Take 1 capsule (24 mcg total) by mouth 2 (two) times daily with a meal.     magnesium hydroxide 400 MG/5ML suspension  Commonly known as:  MILK OF MAGNESIA  Take 30 mLs by mouth daily as needed for mild constipation.      metolazone 2.5 MG tablet  Commonly known as:  ZAROXOLYN  Take 2.5 mg by mouth once a week. Weekly on Wed     pantoprazole 40 MG tablet  Commonly known as:  PROTONIX  Take 1 tablet (40 mg total) by mouth 2 (two) times daily.     polyethylene glycol packet  Commonly known as:  MIRALAX / GLYCOLAX  Take 17 g by mouth daily.     POTASSIUM CHLORIDE PO  Take 40 mEq by mouth 2 (two) times daily.     predniSONE 20 MG tablet  Commonly known as:  DELTASONE  2 by mouth at breakfast for arthritis     promethazine 25 MG tablet  Commonly known as:  PHENERGAN  Take 25 mg by mouth every 6 (six) hours as needed for nausea or vomiting.     torsemide 20 MG tablet  Commonly known as:  DEMADEX  Take 20 mg by mouth 2 (two) times daily.     traMADol 50 MG tablet  Commonly known as:  ULTRAM  Take one tablet by mouth every 8 hours as needed for moderate pain     Vitamin D (Ergocalciferol) 50000 UNITS Caps capsule  Commonly known as:  DRISDOL  Take 50,000 Units by mouth every 7 (seven) days. On Sundays, for low vit D        No orders of the defined types were placed in this encounter.     There is no immunization history on file for this patient.  History  Substance Use Topics  . Smoking status: Never Smoker   . Smokeless tobacco: Former Systems developer    Types: Snuff  . Alcohol Use: No    Review of Systems  DATA OBTAINED: from patient,nurse GENERAL:  no fevers, fatigue, appetite changes SKIN: No itching, rash HEENT: No complaint RESPIRATORY: No cough, wheezing, SOB CARDIAC: No chest pain, palpitations, lower extremity edema  GI: No abdominal pain, No N/V/D or constipation, No heartburn or reflux  GU: No dysuria, frequency or urgency, or incontinence  MUSCULOSKELETAL: No unrelieved bone/joint pain NEUROLOGIC: No headache, dizziness  PSYCHIATRIC: No overt anxiety or sadness  Filed Vitals:   07/25/14 1328  BP: 132/87  Pulse: 86  Temp: 98.2 F (36.8 C)  Resp: 18    Physical  Exam  GENERAL APPEARANCE: Alert, No acute distress  SKIN: dressed wound RLE HEENT: Unremarkable RESPIRATORY: Breathing is even, unlabored. Lung sounds are clear   CARDIOVASCULAR: Heart RRR no murmurs, rubs or gallops. No peripheral edema  GASTROINTESTINAL: Abdomen is soft, non-tender, not distended w/ normal bowel sounds.  GENITOURINARY: Bladder non tender, not distended  MUSCULOSKELETAL: No abnormal joints or musculature NEUROLOGIC: Cranial nerves 2-12 grossly intact PSYCHIATRIC: Mood and affect appropriate to situation, no behavioral issues  Patient Active Problem List   Diagnosis Date Noted  . DM type 2 (diabetes mellitus, type 2) 07/25/2014  . Thrush 07/04/2014  . Left tibial fracture 04/26/2014  . Vitamin D deficiency 03/26/2014  . Left fibular fracture 03/26/2014  . Chronic kidney disease (CKD), stage II (mild) 10/18/2013  . Chronic respiratory failure 08/28/2013  . Chronic diastolic heart failure 80/22/3361  . Immobility 08/15/2013  . Acute respiratory failure 08/13/2013  . GERD (gastroesophageal reflux disease) 08/13/2013  . CHF (congestive heart failure) 08/04/2013  . Acute combined systolic and diastolic congestive heart failure, NYHA class 4 08/04/2013  . GI bleed 07/13/2013  . History of lower GI bleeding 07/11/2013  . AKI (acute kidney injury) 06/29/2013  . Acute lower GI bleeding 06/23/2013  . PNA (pneumonia) 06/19/2013  . Rash and nonspecific skin eruption 06/19/2013  . Chronic respiratory failure with hypoxia 06/18/2013  . Acute encephalopathy 06/07/2013  . Diastolic CHF 22/44/9753  . Pulmonary hypertension 06/05/2013  . Weakness 06/03/2013  . Encounter for therapeutic drug monitoring 04/10/2013  . Long term current use of anticoagulant 10/26/2011  . Hypokalemia 10/15/2011  . Abdominal pain 10/15/2011  . Iron deficiency anemia 10/15/2011  . Bladder prolapse, female, acquired 10/15/2011  . Atrial fibrillation 10/15/2011  . Acute on chronic combined  systolic and diastolic congestive heart failure 07/22/2011  . Facial edema 12/30/2010  . Leukopenia 12/30/2010  . Neutropenia 12/30/2010  . Chronic pain 12/25/2010  . Hematochezia 12/25/2010  . Dehydration 12/25/2010  . Thrombocytosis 12/25/2010  . Hyponatremia 12/25/2010  . Chronic diastolic CHF (congestive heart failure), NYHA class 4   . Female bladder prolapse   . Mitral regurgitation 06/04/2010  . HYPOPOTASSEMIA 09/24/2009  . Chronic combined systolic and diastolic congestive heart failure 09/09/2009  . CARDIOMYOPATHY 03/04/2009  . ANASARCA 03/04/2009  . Hypertensive heart disease with CHF 02/20/2009  . CONSTIPATION, CHRONIC 02/20/2009  . DIVERTICULOSIS 02/20/2009    CBC    Component Value Date/Time   WBC 10.7* 08/09/2013 0357   RBC 3.48* 08/09/2013 0357   RBC 1.95* 06/23/2013 1118   HGB 10.4* 08/09/2013 0357   HCT 33.4* 08/09/2013 0357   PLT 280 08/09/2013 0357   MCV 96.0 08/09/2013 0357   LYMPHSABS 1.3 08/05/2013 0800   MONOABS 0.4 08/05/2013 0800   EOSABS 0.1 08/05/2013 0800   BASOSABS 0.0 08/05/2013 0800    CMP     Component Value Date/Time   NA 130* 02/22/2014 1613   K 4.6 02/22/2014 1613   CL 93* 02/22/2014 1613   CO2 25 02/22/2014 1613   GLUCOSE 178* 02/22/2014 1613   BUN 43* 02/22/2014 1613   CREATININE 1.62* 02/22/2014 1613   CREATININE 1.06 11/28/2013 1110   CALCIUM 8.7 02/22/2014 1613   PROT 6.0 08/05/2013 0800   ALBUMIN 2.7* 08/05/2013 0800   AST 33 08/05/2013 0800   ALT 86* 08/05/2013 0800   ALKPHOS 120* 08/05/2013 0800   BILITOT 0.3 08/05/2013 0800   GFRNONAA 49* 11/28/2013 1110   GFRAA 57* 11/28/2013 1110    Assessment and Plan  DM type 2 (diabetes mellitus, type 2) NEW DX, at least as long as I have known her. On no meds for same.  A1c in 05/2013 was 5.6. In past 4-5 days BS in 300-500 Obtaining A1c FLP, TSH, CMP. Starting  ac and qHS with SSI coverage to start. Pt has never before has high  BS with infection prior so checking CBC but  clinically don't think that is problem. Pt saw her PCP 6/15 and no infections suspected then either.  Acute combined systolic and diastolic congestive heart failure, NYHA class 4 At baseline, checking BNP for baseline value    Hennie Duos, MD

## 2014-07-27 ENCOUNTER — Encounter: Payer: Self-pay | Admitting: Internal Medicine

## 2014-07-29 ENCOUNTER — Encounter: Payer: Self-pay | Admitting: Internal Medicine

## 2014-07-29 ENCOUNTER — Non-Acute Institutional Stay (SKILLED_NURSING_FACILITY): Payer: Medicare Other | Admitting: Internal Medicine

## 2014-07-29 DIAGNOSIS — E119 Type 2 diabetes mellitus without complications: Secondary | ICD-10-CM | POA: Diagnosis not present

## 2014-07-29 DIAGNOSIS — E876 Hypokalemia: Secondary | ICD-10-CM | POA: Diagnosis not present

## 2014-07-29 DIAGNOSIS — E785 Hyperlipidemia, unspecified: Secondary | ICD-10-CM | POA: Diagnosis not present

## 2014-07-29 NOTE — Assessment & Plan Note (Signed)
Pt's K+ ran low over weekend, presumed form new insulin use. Pt's Mg was 2.2 . Patient K+ was increased from 20 meq BID to 40 meq BID for now.

## 2014-07-29 NOTE — Assessment & Plan Note (Signed)
On 6/17 LDL 283, HDL - 53, TG 390. Starting lipitor 40 mg daily. Repeat FLP and CMP 1 month.

## 2014-07-29 NOTE — Assessment & Plan Note (Addendum)
HbA1c - 8.8, TSH 1.47; have reviewed BS since pt has been on sliding scale. Starting Toujeo 10 u qHS today. Pt is on low dose ACE. Statin will be started as pt's FLP was abnormal. Pt has been on prednisone which may be playing a role in pt's DM, even though she has a FH. Prednisone is being weaned to a lower dose.

## 2014-07-29 NOTE — Progress Notes (Signed)
MRN: [id number] Name: [name]  Sex: female Age: 80 y.o. DOB: Sep 30, 1934  [place] #: [name] Facility/Room: 305B Level Of Care: SNF Provider: Inocencio Homes D Emergency Contacts: Extended Emergency Contact Information Primary Emergency Contact: Utley,Reggie Address: [address], [place] [address] [place] Phone: [phone] Work Phone: [phone] Relation: Son Secondary Emergency Contact: Ruiter,[name]          [place], [place] Phone: [phone] Relation: Daughter  Code Status: FULL  Allergies: Levaquin; Lexapro; Penicillins; Tetanus-diphtheria toxoids td; and Latex  Chief Complaint  Patient presents with  . Medical Management of Chronic Issues    HPI: Patient is 80 y.o. female with recent onset/recrudecense DM being followed up for labs and new use of insulin..  Past Medical History  Diagnosis Date  . HTN (hypertension)   . Diverticulosis   . Hemorrhoid   . Osteoarthritis   . Constipation, chronic   . H/O: hysterectomy   . Systolic and diastolic CHF, chronic     a. Echo 07/2011: Technically limited; inferior HK, inferolateral and possibly lateral wall HK, EF 40%, moderate LVE, aortic sclerosis without stenosis, mild MR, mild LAE, question RV dysfunction;   b.  Echo (04/2013): EF 50-55%, normal wall motion, moderate MR, mild to moderate LAE, PASP 37  . Female bladder prolapse     with prolapse uterus, cystocele, s/p TVH/BSO 12/2006  . Obesity (BMI 30-39.9)     wt. 94.1 kg 12/2010  . Moderate mitral regurgitation     a. Mod to Sev by echo 01/2009;  b. mild MR by echo 2013  . Atrial fibrillation 10/13/2011    coumadin Rx  . Iron deficiency anemia   . Pneumonia   . Shortness of breath dyspnea   . GERD (gastroesophageal reflux disease)   . DM type 2 (diabetes mellitus, type 2) 07/25/2014  . Hyperlipidemia     Past Surgical History  Procedure Laterality Date  . Wrist surgery    . Vaginal  hysterectomy,cystocele and prolapse  repair  NOV. 2008  . Abdominal hysterectomy    . Colonoscopy N/A 07/18/2013    Procedure: COLONOSCOPY;  Surgeon: Wonda Horner, MD;  Location: [facility] ENDOSCOPY;  Service: Endoscopy;  Laterality: N/A;  . Flexible sigmoidoscopy N/A 03/08/2014    Procedure: FLEXIBLE SIGMOIDOSCOPY;  Surgeon: Lear Ng, MD;  Location: [facility] ENDOSCOPY;  Service: Endoscopy;  Laterality: N/A;  need hoyer lift      Medication List       This list is accurate as of: 07/29/14 10:06 PM.  Always use your most recent med list.               albuterol (2.5 MG/3ML) 0.083% nebulizer solution  Commonly known as:  PROVENTIL  Take 2.5 mg by nebulization every 8 (eight) hours as needed for wheezing or shortness of breath (For wheezing and shortness of breath).     bisacodyl 10 MG suppository  Commonly known as:  DULCOLAX  Place 10 mg rectally daily as needed for moderate constipation (if constipation not relieved by MOM).     carvedilol 3.125 MG tablet  Commonly known as:  COREG  Take 3.125 mg by mouth 2 (two) times daily with a meal. Hold if SBP <100     feeding supplement (PRO-STAT SUGAR FREE 64) Liqd  Take 30 mLs by mouth daily.     FERRETTS 325 (106 FE) MG Tabs tablet  Generic drug:  ferrous fumarate  Take 1 tablet by mouth 2 (two) times daily. 8am and 4pm for Iron Deficiency     HYDROcodone-acetaminophen 5-325 MG per tablet  Commonly known as:  NORCO/VICODIN  Take one tablet by mouth every 6 hours as needed for pain (control)     lisinopril 2.5 MG tablet  Commonly known as:  PRINIVIL,ZESTRIL  Take 2.5 mg by mouth daily.     LORazepam 0.5 MG tablet  Commonly known as:  ATIVAN  Take one tablet by mouth every 6 hours as needed for anxiety     lubiprostone 24 MCG capsule  Commonly known as:  AMITIZA  Take 1 capsule (24 mcg total) by mouth 2 (two) times daily with a meal.     magnesium hydroxide 400 MG/5ML suspension  Commonly known as:  MILK OF MAGNESIA  Take 30  mLs by mouth daily as needed for mild constipation.     metolazone 2.5 MG tablet  Commonly known as:  ZAROXOLYN  Take 2.5 mg by mouth once a week. Weekly on Wed     pantoprazole 40 MG tablet  Commonly known as:  PROTONIX  Take 1 tablet (40 mg total) by mouth 2 (two) times daily.     polyethylene glycol packet  Commonly known as:  MIRALAX / GLYCOLAX  Take 17 g by mouth daily.     POTASSIUM CHLORIDE PO  Take 40 mEq by mouth 2 (two) times daily.     predniSONE 20 MG tablet  Commonly known as:  DELTASONE  Take 30 mg by mouth daily with breakfast. 30 by mouth at breakfast for arthritis     promethazine 25 MG tablet  Commonly known as:  PHENERGAN  Take 25 mg by mouth every 6 (six) hours as needed for nausea or vomiting.     torsemide 20 MG tablet  Commonly known as:  DEMADEX  Take 20 mg by mouth 2 (two) times daily.     traMADol 50 MG tablet  Commonly known as:  ULTRAM  Take one tablet by mouth every 8 hours as needed for moderate pain     Vitamin D (Ergocalciferol) 50000 UNITS Caps capsule  Commonly known as:  DRISDOL  Take 50,000 Units by mouth every 7 (seven) days. On Sundays, for low vit D        No orders of the defined types were placed in this encounter.     There is no immunization history on file for this patient.  History  Substance Use Topics  . Smoking status: Never Smoker   . Smokeless tobacco: Former Systems developer    Types: Snuff  . Alcohol Use: No    Review of Systems  DATA OBTAINED: from patient, nurse, family member, son GENERAL:  no fevers, +fatigue, appetite changes SKIN: No itching, rash HEENT: No complaint RESPIRATORY: No cough, wheezing, SOB CARDIAC: No chest pain, palpitations, lower extremity edema  GI: No abdominal pain, No N/V/D or constipation, No heartburn or reflux  GU: No dysuria, frequency or urgency, or incontinence  MUSCULOSKELETAL: No unrelieved bone/joint pain NEUROLOGIC: No headache, dizziness  PSYCHIATRIC: No overt anxiety or  sadness  Filed Vitals:   07/29/14 1209  BP: 118/68  Pulse: 78  Temp: 97.1 F (36.2 C)  Resp: 20    Physical Exam  GENERAL APPEARANCE: BF No acute distress  SKIN: No diaphoresis rash; dressed wound RLE HEENT: Unremarkable RESPIRATORY: Breathing is even, unlabored. Lung sounds are clear   CARDIOVASCULAR: Heart RRR no murmurs, rubs or gallops. No peripheral  edema  GASTROINTESTINAL: Abdomen is soft, non-tender, not distended w/ normal bowel sounds.  GENITOURINARY: Bladder non tender, not distended  MUSCULOSKELETAL: No abnormal joints or musculature NEUROLOGIC: Cranial nerves 2-12 grossly intact PSYCHIATRIC: Mood and affect appropriate to situation, no behavioral issues  Patient Active Problem List   Diagnosis Date Noted  . Hyperlipidemia   . DM type 2 (diabetes mellitus, type 2) 07/25/2014  . Thrush 07/04/2014  . Left tibial fracture 04/26/2014  . Vitamin D deficiency 03/26/2014  . Left fibular fracture 03/26/2014  . Chronic kidney disease (CKD), stage II (mild) 10/18/2013  . Chronic respiratory failure 08/28/2013  . Chronic diastolic heart failure 98/33/8250  . Immobility 08/15/2013  . Acute respiratory failure 08/13/2013  . GERD (gastroesophageal reflux disease) 08/13/2013  . CHF (congestive heart failure) 08/04/2013  . Acute combined systolic and diastolic congestive heart failure, NYHA class 4 08/04/2013  . GI bleed 07/13/2013  . History of lower GI bleeding 07/11/2013  . AKI (acute kidney injury) 06/29/2013  . Acute lower GI bleeding 06/23/2013  . PNA (pneumonia) 06/19/2013  . Rash and nonspecific skin eruption 06/19/2013  . Chronic respiratory failure with hypoxia 06/18/2013  . Acute encephalopathy 06/07/2013  . Diastolic CHF 53/97/6734  . Pulmonary hypertension 06/05/2013  . Weakness 06/03/2013  . Encounter for therapeutic drug monitoring 04/10/2013  . Long term current use of anticoagulant 10/26/2011  . Hypokalemia 10/15/2011  . Abdominal pain 10/15/2011  .  Iron deficiency anemia 10/15/2011  . Bladder prolapse, female, acquired 10/15/2011  . Atrial fibrillation 10/15/2011  . Acute on chronic combined systolic and diastolic congestive heart failure 07/22/2011  . Facial edema 12/30/2010  . Leukopenia 12/30/2010  . Neutropenia 12/30/2010  . Chronic pain 12/25/2010  . Hematochezia 12/25/2010  . Dehydration 12/25/2010  . Thrombocytosis 12/25/2010  . Hyponatremia 12/25/2010  . Chronic diastolic CHF (congestive heart failure), NYHA class 4   . Female bladder prolapse   . Mitral regurgitation 06/04/2010  . HYPOPOTASSEMIA 09/24/2009  . Chronic combined systolic and diastolic congestive heart failure 09/09/2009  . CARDIOMYOPATHY 03/04/2009  . ANASARCA 03/04/2009  . Hypertensive heart disease with CHF 02/20/2009  . CONSTIPATION, CHRONIC 02/20/2009  . DIVERTICULOSIS 02/20/2009    CBC    Component Value Date/Time   WBC 10.7* 08/09/2013 0357   RBC 3.48* 08/09/2013 0357   RBC 1.95* 06/23/2013 1118   HGB 10.4* 08/09/2013 0357   HCT 33.4* 08/09/2013 0357   PLT 280 08/09/2013 0357   MCV 96.0 08/09/2013 0357   LYMPHSABS 1.3 08/05/2013 0800   MONOABS 0.4 08/05/2013 0800   EOSABS 0.1 08/05/2013 0800   BASOSABS 0.0 08/05/2013 0800    CMP     Component Value Date/Time   NA 130* 02/22/2014 1613   K 4.6 02/22/2014 1613   CL 93* 02/22/2014 1613   CO2 25 02/22/2014 1613   GLUCOSE 178* 02/22/2014 1613   BUN 43* 02/22/2014 1613   CREATININE 1.62* 02/22/2014 1613   CREATININE 1.06 11/28/2013 1110   CALCIUM 8.7 02/22/2014 1613   PROT 6.0 08/05/2013 0800   ALBUMIN 2.7* 08/05/2013 0800   AST 33 08/05/2013 0800   ALT 86* 08/05/2013 0800   ALKPHOS 120* 08/05/2013 0800   BILITOT 0.3 08/05/2013 0800   GFRNONAA 49* 11/28/2013 1110   GFRAA 57* 11/28/2013 1110    Assessment and Plan  DM type 2 (diabetes mellitus, type 2) HbA1c - 8.8, TSH 1.47; have reviewed BS since pt has been on sliding scale. Starting Toujeo 10 u qHS today. Pt is on low  dose  ACE. Statin will be started as pt's FLP was abnormal. Pt has been on prednisone which may be playing a role in pt's DM, even though she has a FH. Prednisone is being weaned to a lower dose.  Hypokalemia Pt's K+ ran low over weekend, presumed form new insulin use. Pt's Mg was 2.2 . Patient K+ was increased from 20 meq BID to 40 meq BID for now.  Hyperlipidemia On 6/17 LDL 283, HDL - 53, TG 390. Starting lipitor 40 mg daily. Repeat FLP and CMP 1 month.    Hennie Duos, MD

## 2014-07-30 ENCOUNTER — Emergency Department (HOSPITAL_COMMUNITY): Payer: Medicare Other

## 2014-07-30 ENCOUNTER — Encounter (HOSPITAL_BASED_OUTPATIENT_CLINIC_OR_DEPARTMENT_OTHER): Payer: Medicare Other | Attending: General Surgery

## 2014-07-30 ENCOUNTER — Encounter (HOSPITAL_COMMUNITY): Payer: Self-pay

## 2014-07-30 ENCOUNTER — Emergency Department (HOSPITAL_COMMUNITY)
Admission: EM | Admit: 2014-07-30 | Discharge: 2014-07-30 | Disposition: A | Discharge status: Home / Self Care | Payer: Medicare Other | Attending: Emergency Medicine | Admitting: Emergency Medicine

## 2014-07-30 DIAGNOSIS — K219 Gastro-esophageal reflux disease without esophagitis: Secondary | ICD-10-CM | POA: Insufficient documentation

## 2014-07-30 DIAGNOSIS — M1990 Unspecified osteoarthritis, unspecified site: Secondary | ICD-10-CM | POA: Insufficient documentation

## 2014-07-30 DIAGNOSIS — Z91040 Latex allergy status: Secondary | ICD-10-CM | POA: Insufficient documentation

## 2014-07-30 DIAGNOSIS — I10 Essential (primary) hypertension: Secondary | ICD-10-CM | POA: Diagnosis not present

## 2014-07-30 DIAGNOSIS — Z87448 Personal history of other diseases of urinary system: Secondary | ICD-10-CM | POA: Diagnosis not present

## 2014-07-30 DIAGNOSIS — I5042 Chronic combined systolic (congestive) and diastolic (congestive) heart failure: Secondary | ICD-10-CM | POA: Insufficient documentation

## 2014-07-30 DIAGNOSIS — E785 Hyperlipidemia, unspecified: Secondary | ICD-10-CM | POA: Insufficient documentation

## 2014-07-30 DIAGNOSIS — Z90710 Acquired absence of both cervix and uterus: Secondary | ICD-10-CM | POA: Insufficient documentation

## 2014-07-30 DIAGNOSIS — Z880 Allergy status to penicillin: Secondary | ICD-10-CM | POA: Insufficient documentation

## 2014-07-30 DIAGNOSIS — L089 Local infection of the skin and subcutaneous tissue, unspecified: Secondary | ICD-10-CM | POA: Diagnosis not present

## 2014-07-30 DIAGNOSIS — Z7952 Long term (current) use of systemic steroids: Secondary | ICD-10-CM | POA: Diagnosis not present

## 2014-07-30 DIAGNOSIS — Z8781 Personal history of (healed) traumatic fracture: Secondary | ICD-10-CM | POA: Insufficient documentation

## 2014-07-30 DIAGNOSIS — Z794 Long term (current) use of insulin: Secondary | ICD-10-CM | POA: Diagnosis not present

## 2014-07-30 DIAGNOSIS — D509 Iron deficiency anemia, unspecified: Secondary | ICD-10-CM | POA: Insufficient documentation

## 2014-07-30 DIAGNOSIS — I4891 Unspecified atrial fibrillation: Secondary | ICD-10-CM | POA: Insufficient documentation

## 2014-07-30 DIAGNOSIS — E669 Obesity, unspecified: Secondary | ICD-10-CM | POA: Diagnosis not present

## 2014-07-30 DIAGNOSIS — Z8701 Personal history of pneumonia (recurrent): Secondary | ICD-10-CM | POA: Diagnosis not present

## 2014-07-30 DIAGNOSIS — T798XXA Other early complications of trauma, initial encounter: Secondary | ICD-10-CM

## 2014-07-30 DIAGNOSIS — K5900 Constipation, unspecified: Secondary | ICD-10-CM | POA: Diagnosis not present

## 2014-07-30 DIAGNOSIS — Z79899 Other long term (current) drug therapy: Secondary | ICD-10-CM | POA: Diagnosis not present

## 2014-07-30 DIAGNOSIS — R109 Unspecified abdominal pain: Secondary | ICD-10-CM | POA: Diagnosis not present

## 2014-07-30 DIAGNOSIS — R739 Hyperglycemia, unspecified: Secondary | ICD-10-CM

## 2014-07-30 DIAGNOSIS — E1165 Type 2 diabetes mellitus with hyperglycemia: Secondary | ICD-10-CM | POA: Diagnosis not present

## 2014-07-30 DIAGNOSIS — Z4800 Encounter for change or removal of nonsurgical wound dressing: Secondary | ICD-10-CM | POA: Diagnosis present

## 2014-07-30 LAB — CBC WITH DIFFERENTIAL/PLATELET
BASOS ABS: 0 10*3/uL (ref 0.0–0.1)
Basophils Relative: 1 % (ref 0–1)
EOS ABS: 0 10*3/uL (ref 0.0–0.7)
Eosinophils Relative: 0 % (ref 0–5)
HCT: 38.2 % (ref 36.0–46.0)
Hemoglobin: 12.1 g/dL (ref 12.0–15.0)
LYMPHS PCT: 9 % — AB (ref 12–46)
Lymphs Abs: 0.8 10*3/uL (ref 0.7–4.0)
MCH: 32.4 pg (ref 26.0–34.0)
MCHC: 31.7 g/dL (ref 30.0–36.0)
MCV: 102.4 fL — AB (ref 78.0–100.0)
Monocytes Absolute: 0.2 10*3/uL (ref 0.1–1.0)
Monocytes Relative: 3 % (ref 3–12)
Neutro Abs: 7.1 10*3/uL (ref 1.7–7.7)
Neutrophils Relative %: 87 % — ABNORMAL HIGH (ref 43–77)
PLATELETS: 213 10*3/uL (ref 150–400)
RBC: 3.73 MIL/uL — ABNORMAL LOW (ref 3.87–5.11)
RDW: 15.5 % (ref 11.5–15.5)
WBC: 8.2 10*3/uL (ref 4.0–10.5)

## 2014-07-30 LAB — URINALYSIS, ROUTINE W REFLEX MICROSCOPIC
Bilirubin Urine: NEGATIVE
Glucose, UA: 500 mg/dL — AB
Hgb urine dipstick: NEGATIVE
Ketones, ur: NEGATIVE mg/dL
Nitrite: NEGATIVE
Protein, ur: NEGATIVE mg/dL
Specific Gravity, Urine: 1.011 (ref 1.005–1.030)
Urobilinogen, UA: 0.2 mg/dL (ref 0.0–1.0)
pH: 7 (ref 5.0–8.0)

## 2014-07-30 LAB — COMPREHENSIVE METABOLIC PANEL
ALT: 16 U/L (ref 14–54)
AST: 16 U/L (ref 15–41)
Albumin: 3.2 g/dL — ABNORMAL LOW (ref 3.5–5.0)
Alkaline Phosphatase: 55 U/L (ref 38–126)
Anion gap: 14 (ref 5–15)
BUN: 45 mg/dL — ABNORMAL HIGH (ref 6–20)
CALCIUM: 8.9 mg/dL (ref 8.9–10.3)
CO2: 24 mmol/L (ref 22–32)
Chloride: 95 mmol/L — ABNORMAL LOW (ref 101–111)
Creatinine, Ser: 1.32 mg/dL — ABNORMAL HIGH (ref 0.44–1.00)
GFR calc Af Amer: 44 mL/min — ABNORMAL LOW (ref >60)
GFR calc non Af Amer: 38 mL/min — ABNORMAL LOW (ref >60)
GLUCOSE: 401 mg/dL — AB (ref 65–99)
Potassium: 3.6 mmol/L (ref 3.5–5.1)
Sodium: 133 mmol/L — ABNORMAL LOW (ref 135–145)
TOTAL PROTEIN: 5.7 g/dL — AB (ref 6.5–8.1)
Total Bilirubin: 0.8 mg/dL (ref 0.3–1.2)

## 2014-07-30 LAB — I-STAT CG4 LACTIC ACID, ED: Lactic Acid, Venous: 2.22 mmol/L (ref 0.5–2.0)

## 2014-07-30 LAB — URINE MICROSCOPIC-ADD ON

## 2014-07-30 LAB — CBG MONITORING, ED: GLUCOSE-CAPILLARY: 237 mg/dL — AB (ref 65–99)

## 2014-07-30 LAB — I-STAT TROPONIN, ED: Troponin i, poc: 0.01 ng/mL (ref 0.00–0.08)

## 2014-07-30 LAB — LIPASE, BLOOD: Lipase: 17 U/L — ABNORMAL LOW (ref 22–51)

## 2014-07-30 MED ORDER — CLINDAMYCIN HCL 300 MG PO CAPS: 300.0000 mg | ORAL_CAPSULE | Freq: Four times a day (QID) | ORAL | Status: DC

## 2014-07-30 MED ORDER — CLINDAMYCIN HCL 300 MG PO CAPS
300.0000 mg | ORAL_CAPSULE | Freq: Once | ORAL | Status: AC
  Administered 2014-07-30: 300 mg via ORAL
  Filled 2014-07-30: qty 2
  Filled 2014-07-30: qty 1

## 2014-07-30 MED ORDER — IOHEXOL 300 MG/ML  SOLN
25.0000 mL | Freq: Once | INTRAMUSCULAR | Status: AC | PRN
  Administered 2014-07-30: 25 mL via ORAL

## 2014-07-30 MED ORDER — INSULIN ASPART 100 UNIT/ML ~~LOC~~ SOLN
4.0000 [IU] | Freq: Once | SUBCUTANEOUS | Status: AC
  Administered 2014-07-30: 4 [IU] via INTRAVENOUS
  Filled 2014-07-30: qty 1

## 2014-07-30 MED ORDER — SODIUM CHLORIDE 0.9 % IV BOLUS (SEPSIS)
500.0000 mL | Freq: Once | INTRAVENOUS | Status: AC
  Administered 2014-07-30: 500 mL via INTRAVENOUS

## 2014-07-30 MED ORDER — OXYCODONE-ACETAMINOPHEN 5-325 MG PO TABS
1.0000 | ORAL_TABLET | Freq: Once | ORAL | Status: AC
Start: 2014-07-30 — End: 2014-07-30
  Administered 2014-07-30: 1 via ORAL
  Filled 2014-07-30: qty 1

## 2014-07-30 MED ORDER — IOHEXOL 300 MG/ML  SOLN
100.0000 mL | Freq: Once | INTRAMUSCULAR | Status: AC | PRN
  Administered 2014-07-30: 80 mL via INTRAVENOUS

## 2014-07-30 NOTE — ED Notes (Signed)
Lab at the bedside 

## 2014-07-30 NOTE — ED Notes (Signed)
Pt returned from scans. Monitored by pulse ox, bp cuff, and 5-lead. 

## 2014-07-30 NOTE — ED Notes (Signed)
Gerald Stabs, PA at the bedside. Daughter to the bedside.

## 2014-07-30 NOTE — ED Notes (Signed)
Pt placed in gown and in bed. Pt monitored by pulse ox, bp cuff, and 5-lead. 

## 2014-07-30 NOTE — ED Provider Notes (Signed)
CT scan negative for acute process Will place on clindamycin for her wound I instructed nursing to re-bandage wound She is nontoxic, no distress at this time I feel she is appropriate for outpatient management Glucose improved Urine sent for culture and may need change in ABX once culture results I advised daughter that if she feels wound is worsening/not responding to clindmycin to bring her back at anytime She is afebrile here (repeat temp at 98 per tech)   Ripley Fraise, MD 07/30/14 1819

## 2014-07-30 NOTE — ED Notes (Signed)
Per [facility], pt from [facility] for abd pain per patient. Pt has a wound care appt today but was at 1300. Daughter called 911 because she didn't like the way her mother looked.  Pt states her stomach has been bothering her for several weeks now.

## 2014-07-30 NOTE — ED Provider Notes (Signed)
EKG Interpretation  Date/Time:  Tuesday July 30 2014 13:35:20 EDT Ventricular Rate:  94 PR Interval:    QRS Duration: 120 QT Interval:  367 QTC Calculation: 459 R Axis:   -28 Text Interpretation:  Atrial fibrillation Nonspecific IVCD with LAD LVH with secondary repolarization abnormality Inferior infarct, old Anterior infarct, old No significant change since last tracing Confirmed by Christy Gentles  MD, Duriel Deery ([id number]) on 07/30/2014 4:52:13 PM        Ripley Fraise, MD 07/30/14 1900

## 2014-07-30 NOTE — ED Notes (Signed)
NOTIFIED C.LAWYER ,PA FOR PATIENTS LAB RESULTS OF CG4+LACTIC ACID @14 :14PM ,07/30/2014.

## 2014-07-30 NOTE — ED Provider Notes (Signed)
CSN: [id number]     Arrival date & time 07/30/14  1316 History   First MD Initiated Contact with Patient 07/30/14 1325     Chief Complaint  Patient presents with  . Wound Check  . Abdominal Pain     (Consider location/radiation/quality/duration/timing/severity/associated sxs/prior Treatment) HPI Patient presents to the emergency department with multiple issues.  The patient is at a nursing home and the daughter noted that she had worsening and a wound to her left lower lateral leg.  The patient also recently been placed on insulin due to elevated blood sugars.  She also has been having stomach issues over the last several weeks.  The patient's daughter states that she thought that she may be having constipation.  Patient is unable to give me any history.  She can verbalize some discomfort on exam.  Patient has not had any vomiting, diarrhea, altered mental status or syncope Past Medical History  Diagnosis Date  . HTN (hypertension)   . Diverticulosis   . Hemorrhoid   . Osteoarthritis   . Constipation, chronic   . H/O: hysterectomy   . Systolic and diastolic CHF, chronic     a. Echo 07/2011: Technically limited; inferior HK, inferolateral and possibly lateral wall HK, EF 40%, moderate LVE, aortic sclerosis without stenosis, mild MR, mild LAE, question RV dysfunction;   b.  Echo (04/2013): EF 50-55%, normal wall motion, moderate MR, mild to moderate LAE, PASP 37  . Female bladder prolapse     with prolapse uterus, cystocele, s/p TVH/BSO 12/2006  . Obesity (BMI 30-39.9)     wt. 94.1 kg 12/2010  . Moderate mitral regurgitation     a. Mod to Sev by echo 01/2009;  b. mild MR by echo 2013  . Atrial fibrillation 10/13/2011    coumadin Rx  . Iron deficiency anemia   . Pneumonia   . Shortness of breath dyspnea   . GERD (gastroesophageal reflux disease)   . DM type 2 (diabetes mellitus, type 2) 07/25/2014  . Hyperlipidemia   . Ankle fracture    Past Surgical History  Procedure Laterality  Date  . Wrist surgery    . Vaginal hysterectomy,cystocele and prolapse  repair  NOV. 2008  . Abdominal hysterectomy    . Colonoscopy N/A 07/18/2013    Procedure: COLONOSCOPY;  Surgeon: Wonda Horner, MD;  Location: [facility] ENDOSCOPY;  Service: Endoscopy;  Laterality: N/A;  . Flexible sigmoidoscopy N/A 03/08/2014    Procedure: FLEXIBLE SIGMOIDOSCOPY;  Surgeon: Lear Ng, MD;  Location: [facility] ENDOSCOPY;  Service: Endoscopy;  Laterality: N/A;  need hoyer lift   Family History  Problem Relation Age of Onset  . Diabetes Brother   . Hypertension Mother     deceased @ 43  . Leukemia Brother   . Cancer Brother   . Stroke Mother   . Diabetes Father     deceased in his 18's   History  Substance Use Topics  . Smoking status: Never Smoker   . Smokeless tobacco: Former Systems developer    Types: Snuff  . Alcohol Use: No   OB History    Gravida Para Term Preterm AB TAB SAB Ectopic Multiple Living   6 6             Review of Systems   All other systems negative except as documented in the HPI. All pertinent positives and negatives as reviewed in the HPI.  Allergies  Levaquin; Lexapro; Penicillins; Tetanus-diphtheria toxoids td; and Latex  Home Medications  Prior to Admission medications   Medication Sig Start Date End Date Taking? Authorizing Provider  atorvastatin (LIPITOR) 40 MG tablet Take 40 mg by mouth daily.   Yes Historical Provider, MD  carvedilol (COREG) 3.125 MG tablet Take 3.125 mg by mouth 2 (two) times daily with a meal. Hold if SBP <100   Yes Historical Provider, MD  ferrous fumarate (FERRETTS) 325 (106 FE) MG TABS Take 1 tablet by mouth daily.    Yes Historical Provider, MD  HYDROcodone-acetaminophen (NORCO/VICODIN) 5-325 MG per tablet Take one tablet by mouth every 6 hours as needed for pain (control) 07/04/14  Yes Tiffany L Reed, DO  insulin aspart (NOVOLOG) 100 UNIT/ML injection Inject into the skin 4 (four) times daily -  before meals and at bedtime. Per sliding scale- no  sliding scale instructions on MAR   Yes Historical Provider, MD  Insulin Glargine (TOUJEO SOLOSTAR) 300 UNIT/ML SOPN Inject 10 Units into the skin at bedtime.   Yes Historical Provider, MD  lactulose (CHRONULAC) 10 GM/15ML solution Take 10 g by mouth daily.   Yes Historical Provider, MD  loratadine (CLARITIN) 10 MG tablet Take 10 mg by mouth daily.   Yes Historical Provider, MD  lubiprostone (AMITIZA) 24 MCG capsule Take 1 capsule (24 mcg total) by mouth 2 (two) times daily with a meal. Patient taking differently: Take 24 mcg by mouth 2 (two) times daily with a meal. For constipation 07/19/13  Yes Velvet Bathe, MD  metolazone (ZAROXOLYN) 2.5 MG tablet Take 2.5 mg by mouth once a week. Weekly on Wed   Yes Historical Provider, MD  pantoprazole (PROTONIX) 40 MG tablet Take 1 tablet (40 mg total) by mouth 2 (two) times daily. Patient taking differently: Take 40 mg by mouth 2 (two) times daily. For GERD 08/10/13  Yes Barton Dubois, MD  polyethylene glycol [place] / GLYCOLAX) packet Take 17 g by mouth daily.   Yes Historical Provider, MD  potassium chloride SA (K-DUR,KLOR-CON) 20 MEQ tablet Take 40 mEq by mouth 2 (two) times daily.   Yes Historical Provider, MD  predniSONE (DELTASONE) 10 MG tablet Take 30 mg by mouth daily with breakfast.   Yes Historical Provider, MD  PRESCRIPTION MEDICATION Take 120 mLs by mouth 2 (two) times daily. [employer]   Yes Historical Provider, MD  sennosides-docusate sodium (SENOKOT-S) 8.6-50 MG tablet Take 1 tablet by mouth daily.   Yes Historical Provider, MD  sodium chloride (OCEAN) 0.65 % SOLN nasal spray Place 1 spray into both nostrils 2 (two) times daily.   Yes Historical Provider, MD  torsemide (DEMADEX) 20 MG tablet Take 20 mg by mouth 2 (two) times daily.    Yes Historical Provider, MD  Vitamin D, Ergocalciferol, (DRISDOL) 50000 UNITS CAPS capsule Take 50,000 Units by mouth every 7 (seven) days. On Tuesdays, for low vit D   Yes Historical Provider, MD  albuterol  (PROVENTIL) (2.5 MG/3ML) 0.083% nebulizer solution Take 2.5 mg by nebulization every 8 (eight) hours as needed for wheezing or shortness of breath (For wheezing and shortness of breath).     Historical Provider, MD  bisacodyl (DULCOLAX) 10 MG suppository Place 10 mg rectally daily as needed for moderate constipation (if constipation not relieved by MOM).    Historical Provider, MD  lisinopril (PRINIVIL,ZESTRIL) 2.5 MG tablet Take 2.5 mg by mouth daily.    Historical Provider, MD  LORazepam (ATIVAN) 0.5 MG tablet Take one tablet by mouth every 6 hours as needed for anxiety 02/25/14   Lauree Chandler, NP  magnesium  hydroxide (MILK OF MAGNESIA) 400 MG/5ML suspension Take 30 mLs by mouth daily as needed for mild constipation.    Historical Provider, MD  promethazine (PHENERGAN) 25 MG tablet Take 25 mg by mouth every 6 (six) hours as needed for nausea or vomiting.    Historical Provider, MD  traMADol (ULTRAM) 50 MG tablet Take one tablet by mouth every 8 hours as needed for moderate pain 11/26/13   Tiffany L Reed, DO   BP 125/68 mmHg  Pulse 90  Temp(Src) 99 F (37.2 C) (Oral)  Resp 29  SpO2 99% Physical Exam  Constitutional: She appears well-developed and well-nourished. No distress.  HENT:  Head: Normocephalic and atraumatic.  Mouth/Throat: Oropharynx is clear and moist.  Cardiovascular: Normal rate, regular rhythm and normal heart sounds.  Exam reveals no gallop and no friction rub.   No murmur heard. Pulmonary/Chest: Effort normal and breath sounds normal. No respiratory distress.  Abdominal: Soft. Bowel sounds are normal. She exhibits no distension. There is tenderness. There is no rebound and no guarding.  Musculoskeletal:       Legs: Neurological: She is alert. She exhibits normal muscle tone. Coordination normal.  Skin: Skin is warm and dry. No rash noted. No erythema. No pallor.  Nursing note and vitals reviewed.   ED Course  Procedures (including critical care time) Labs  Review Labs Reviewed  CBC WITH DIFFERENTIAL/PLATELET - Abnormal; Notable for the following:    RBC 3.73 (*)    MCV 102.4 (*)    Neutrophils Relative % 87 (*)    Lymphocytes Relative 9 (*)    All other components within normal limits  COMPREHENSIVE METABOLIC PANEL - Abnormal; Notable for the following:    Sodium 133 (*)    Chloride 95 (*)    Glucose, Bld 401 (*)    BUN 45 (*)    Creatinine, Ser 1.32 (*)    Total Protein 5.7 (*)    Albumin 3.2 (*)    GFR calc non Af Amer 38 (*)    GFR calc Af Amer 44 (*)    All other components within normal limits  LIPASE, BLOOD - Abnormal; Notable for the following:    Lipase 17 (*)    All other components within normal limits  I-STAT CG4 LACTIC ACID, ED - Abnormal; Notable for the following:    Lactic Acid, Venous 2.22 (*)    All other components within normal limits  URINE CULTURE  URINALYSIS, ROUTINE W REFLEX MICROSCOPIC (NOT AT [facility]  Randolm Idol, ED    Imaging Review Dg Chest 2 View  07/30/2014   CLINICAL DATA:  Wound check and abdominal pain  EXAM: CHEST  2 VIEW  COMPARISON:  08/04/2013  FINDINGS: Linear opacity in the left lower lung which is chronic and consistent with scarring. There is chronic elevation of the left diaphragm. There is no edema, consolidation, effusion, or pneumothorax.  Chronic cardiomegaly and aortic tortuosity.  IMPRESSION: 1. No active cardiopulmonary disease. 2. Left basilar scarring. 3. Chronic cardiomegaly.   Electronically Signed   By: Monte Fantasia M.D.   On: 07/30/2014 14:31   Dg Tibia/fibula Left  07/30/2014   CLINICAL DATA:  Wound along lateral aspect of left ankle  EXAM: LEFT TIBIA AND FIBULA - 2 VIEW  COMPARISON:  None.  FINDINGS: Healing oblique distal tibial fracture.  Soft tissue injury along the lateral aspect of the mid/distal fibula. No radiographic findings to suggest acute osteomyelitis.  Mild degenerative changes of the tibiotalar joint.  No radiopaque foreign  body is seen.  IMPRESSION:  Healing oblique distal tibial fracture.  Soft tissue injury along the lateral aspect of the mid/distal fibula. No radiographic findings to suggest acute osteomyelitis.   Electronically Signed   By: Julian Hy M.D.   On: 07/30/2014 14:39    Patient will have a CT scan done of the abdomen to look for any further issues causing her possible abdominal discomfort.   Dalia Heading, PA-C 08/01/14 0129  Pamella Pert, MD 08/03/14 1235

## 2014-07-30 NOTE — ED Provider Notes (Signed)
Plan is to f/u on CT imaging and urinalysis If negative d/c back to [facility] on clindamycin for left LE wound and wound center   Ripley Fraise, MD 07/30/14 1646

## 2014-07-30 NOTE — ED Notes (Signed)
Dr. Wickline at the bedside.  

## 2014-08-01 LAB — URINE CULTURE: Culture: >=100000

## 2014-08-13 ENCOUNTER — Encounter: Payer: Medicare Other | Attending: Surgery | Admitting: Surgery

## 2014-08-13 DIAGNOSIS — I509 Heart failure, unspecified: Secondary | ICD-10-CM | POA: Diagnosis not present

## 2014-08-13 DIAGNOSIS — I10 Essential (primary) hypertension: Secondary | ICD-10-CM | POA: Diagnosis not present

## 2014-08-13 DIAGNOSIS — E11622 Type 2 diabetes mellitus with other skin ulcer: Secondary | ICD-10-CM | POA: Insufficient documentation

## 2014-08-13 DIAGNOSIS — I429 Cardiomyopathy, unspecified: Secondary | ICD-10-CM | POA: Diagnosis not present

## 2014-08-13 DIAGNOSIS — M1990 Unspecified osteoarthritis, unspecified site: Secondary | ICD-10-CM | POA: Diagnosis not present

## 2014-08-13 DIAGNOSIS — Z9981 Dependence on supplemental oxygen: Secondary | ICD-10-CM | POA: Diagnosis not present

## 2014-08-13 DIAGNOSIS — J9610 Chronic respiratory failure, unspecified whether with hypoxia or hypercapnia: Secondary | ICD-10-CM | POA: Insufficient documentation

## 2014-08-13 DIAGNOSIS — D649 Anemia, unspecified: Secondary | ICD-10-CM | POA: Insufficient documentation

## 2014-08-13 DIAGNOSIS — L97122 Non-pressure chronic ulcer of left thigh with fat layer exposed: Secondary | ICD-10-CM | POA: Diagnosis not present

## 2014-08-13 DIAGNOSIS — X58XXXA Exposure to other specified factors, initial encounter: Secondary | ICD-10-CM | POA: Insufficient documentation

## 2014-08-13 DIAGNOSIS — F329 Major depressive disorder, single episode, unspecified: Secondary | ICD-10-CM | POA: Diagnosis not present

## 2014-08-13 DIAGNOSIS — S81812A Laceration without foreign body, left lower leg, initial encounter: Secondary | ICD-10-CM | POA: Diagnosis not present

## 2014-08-14 NOTE — Progress Notes (Addendum)
TATENisreen, Guise ([id number]) Visit Report for 08/13/2014 Chief Complaint Document Details Patient Name: [name], [name]. Date of Service: 08/13/2014 2:30 PM Medical Record Number: [id number] Patient Account Number: [id number] Date of Birth/Sex: 1934/09/26 (80 y.o. Female) Treating RN: Primary Care Physician: Kelton Pillar Other Clinician: Referring Physician: Inocencio Homes Treating Physician/Extender: Frann Rider in Treatment: 0 Information Obtained from: Patient Chief Complaint Patient presents to the wound care center for [name] consult due non healing wound. ulcerated area on her left lower extremity and her left thigh for about 3 months. Electronic Signature(s) Signed: 08/13/2014 3:45:00 PM By: Christin Fudge MD, FACS Entered By: Christin Fudge on 08/13/2014 15:26:06 Fryer, [name] ([id number]) -------------------------------------------------------------------------------- HPI Details Patient Name: [name]. Date of Service: 08/13/2014 2:30 PM Medical Record Number: [id number] Patient Account Number: [id number] Date of Birth/Sex: [birth year] (80 y.o. Female) Treating RN: Primary Care Physician: Kelton Pillar Other Clinician: Referring Physician: Inocencio Homes Treating Physician/Extender: Frann Rider in Treatment: 0 History of Present Illness Location: left lower extremity and left thigh Quality: Patient reports experiencing [name] sharp pain to affected area(s). Severity: Patient states wound are getting worse. Duration: Patient has had the wound for > 3 months prior to seeking treatment at the wound center Timing: Pain in wound is Intermittent (comes and goes Context: The wound occurred when the patient had [name] fall in the skilled nursing facility and injured her left lateral lower extremity. Modifying Factors: Consults to this date include: orthopedics where she had [name] cast applied initially and also has gone to the ER [name] few times. Associated Signs and Symptoms:  Patient reports having difficulty standing and is in bed for long periods. HPI Description: The patient was taken to the ED on 07/30/14 by her daughter for [name] worsening wound on her left lower lateral leg which has been there for [name] while. Xray of the left lower extremity -IMPRESSION:Healing oblique distal tibial fracture. Soft tissue injury along the lateral aspect of the mid/distal fibula. No radiographic findings to suggest acute osteomyelitis. The patient was put on clindamycin and given some wound care. Her last hemoglobin A1c was 8.8. opast medical history significant for -- diabetes mellitus type 2, dyspnea, hypertension, chronic constipation, CHF, history of ankle fracture, vaginal hysterectomy, abdomen and pain on and off, status post flexible sigmoidoscopy. Electronic Signature(s) Signed: 08/13/2014 3:45:00 PM By: Christin Fudge MD, FACS Entered By: Christin Fudge on 08/13/2014 15:37:19 Mcclellan, [name] ([id number]) -------------------------------------------------------------------------------- Physical Exam Details Patient Name: [name], [name]. Date of Service: 08/13/2014 2:30 PM Medical Record Number: [id number] Patient Account Number: [id number] Date of Birth/Sex: [birth year] (80 y.o. Female) Treating RN: Primary Care Physician: Kelton Pillar Other Clinician: Referring Physician: Inocencio Homes Treating Physician/Extender: Frann Rider in Treatment: 0 Constitutional . Pulse regular. Respirations normal and unlabored. Afebrile. . Eyes Nonicteric. Reactive to light. Ears, Nose, Mouth, and Throat Lips, teeth, and gums WNL.Marland Kitchen Moist mucosa without lesions . Neck supple and nontender. No palpable supraclavicular or cervical adenopathy. Normal sized without goiter. Respiratory WNL. No retractions.. Cardiovascular Pedal Pulses WNL. ABIs could not be measured due to severe tenderness. No clubbing, cyanosis or edema. Gastrointestinal (GI) Abdomen without masses or tenderness..  No liver or spleen enlargement or tenderness.. Musculoskeletal Adexa without tenderness or enlargement.. Digits and nails w/o clubbing, cyanosis, infection, petechiae, ischemia, or inflammatory conditions.. Integumentary (Hair, Skin) No suspicious lesions. No crepitus or fluctuance. No peri-wound warmth or erythema. No masses.Marland Kitchen Psychiatric Judgement and insight Intact.. No evidence of depression, anxiety, or agitation.. Notes The patient has [name] undermining  wound on the left lateral aspect of the lower extremity with an expose tendon at the base of the ulceration. Surrounding skin does not show any evidence of cellulitis. On her left thigh she has 1 superficial ulceration with some subcutaneous tissue exposed. The other small areas are healing. Electronic Signature(s) Signed: 08/13/2014 3:45:00 PM By: Christin Fudge MD, FACS Entered By: Christin Fudge on 08/13/2014 15:34:10 Perezgarcia, [name] ([id number]) -------------------------------------------------------------------------------- Physician Orders Details Patient Name: [name], [name]. Date of Service: 08/13/2014 2:30 PM Medical Record Number: [id number] Patient Account Number: [id number] Date of Birth/Sex: 04-13-1934 (80 y.o. Female) Treating RN: Montey Hora Primary Care Physician: Kelton Pillar Other Clinician: Referring Physician: Inocencio Homes Treating Physician/Extender: Frann Rider in Treatment: 0 Verbal / Phone Orders: Yes Clinician: Montey Hora Read Back and Verified: Yes Diagnosis Coding ICD-10 Coding Code Description E11.622 Type 2 diabetes mellitus with other skin ulcer Wound Cleansing Wound #1 Left,Lateral Lower Leg o Clean wound with Normal Saline. Wound #2 Lateral,Posterior Upper Leg o Clean wound with Normal Saline. Anesthetic Wound #1 Left,Lateral Lower Leg o Topical Lidocaine 4% cream applied to wound bed prior to debridement Wound #2 Lateral,Posterior Upper Leg o Topical Lidocaine 4% cream  applied to wound bed prior to debridement Skin Barriers/Peri-Wound Care Wound #1 Left,Lateral Lower Leg o Skin Prep Wound #2 Lateral,Posterior Upper Leg o Skin Prep Primary Wound Dressing Wound #1 Left,Lateral Lower Leg o Aquacel Ag Wound #2 Lateral,Posterior Upper Leg o Aquacel Ag Secondary Dressing Wound #1 Left,Lateral Lower Leg o Boardered Foam Dressing Anderegg, Elea [name]. ([id number]) Wound #2 Lateral,Posterior Upper Leg o Boardered Foam Dressing Dressing Change Frequency Wound #1 Left,Lateral Lower Leg o Change dressing every other day. Wound #2 Lateral,Posterior Upper Leg o Change dressing every other day. Follow-up Appointments Wound #1 Left,Lateral Lower Leg o Return Appointment in 1 week. Wound #2 Lateral,Posterior Upper Leg o Return Appointment in 1 week. Off-Loading Wound #1 Left,Lateral Lower Leg o Turn and reposition every 2 hours Wound #2 Lateral,Posterior Upper Leg o Turn and reposition every 2 hours Additional Orders / Instructions Wound #1 Left,Lateral Lower Leg o Increase protein intake. Wound #2 Lateral,Posterior Upper Leg o Increase protein intake. Services and Therapies o Arterial Studies- Bilateral - [facility] oooo Electronic Signature(s) Signed: 08/13/2014 3:45:00 PM By: Christin Fudge MD, FACS Signed: 08/13/2014 5:25:33 PM By: Montey Hora Entered By: Montey Hora on 08/13/2014 15:22:23 Barrasso, Kashmir [name]. ([id number]) Yilmaz, Carolene AMarland Kitchen ([id number]) -------------------------------------------------------------------------------- Problem List Details Patient Name: Quilling, Kanaya [name]. Date of Service: 08/13/2014 2:30 PM Medical Record Number: [id number] Patient Account Number: [id number] Date of Birth/Sex: April 26, 1934 (80 y.o. 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 Date of Service: 08/13/2014 2:30 PM Medical Record Number: [id number] Patient Account Number: [id number] Date of Birth/Sex: 02-15-1934 (80 y.o. Female) Treating RN: Primary Care Physician: Kelton Pillar Other Clinician: Referring Physician: Inocencio Homes Treating Physician/Extender: Frann Rider in Treatment: 0  Subjective Chief Complaint Information obtained from Patient Patient presents to the wound care center for [name] consult due non healing wound. ulcerated area on her left lower extremity and her left thigh for about 3 months. History of Present Illness (HPI) The following HPI elements were documented for the patient's wound: Location: left lower extremity and left thigh Quality: Patient reports experiencing [name] sharp pain to affected area(s). Severity: Patient states wound are getting worse. Duration: Patient has had the wound for > 3 months prior to seeking treatment at the wound center Timing: Pain in wound is Intermittent (comes and goes Context: The wound occurred when the patient had [name] fall in the skilled nursing facility and injured her left lateral lower extremity. Modifying Factors: Consults to this date include: orthopedics where she had [name] cast applied initially and  also has gone to the ER [name] few times. Associated Signs and Symptoms: Patient reports having difficulty standing and is in bed for long periods. The patient was taken to the ED on 07/30/14 by her daughter for [name] worsening wound on her left lower lateral leg which has been there for [name] while. Xray of the left lower extremity -IMPRESSION:Healing oblique distal tibial fracture. Soft tissue injury along the lateral aspect of the mid/distal fibula. No radiographic findings to suggest acute osteomyelitis. The patient was put on clindamycin and given some wound care. Her last hemoglobin A1c was 8.8. past medical history significant for -- diabetes mellitus type 2, dyspnea, hypertension, chronic constipation, CHF, history of ankle fracture, vaginal hysterectomy, abdomen and pain on and off, status post flexible sigmoidoscopy. Wound History Patient presents with 2 open wounds that have been present for approximately 4 months. Patient has been treating wounds in the following manner: silver alginate, packing. Laboratory tests have not been performed in the last month. Patient reportedly has not tested positive for an antibiotic resistant organism. Patient reportedly has not tested positive for osteomyelitis. Patient reportedly has not had testing performed to evaluate circulation in the legs. Patient History Information obtained from Patient. Somers, Isys [name]. ([id number]) Allergies Levaquin, tetanus toxoid,fluid, latex, penicillin Family History Cancer - Siblings, Maternal Grandparents, Diabetes - Siblings, Heart Disease - Siblings, Hypertension - Siblings, Lung Disease - Siblings, No family history of Hereditary Spherocytosis, Seizures, Stroke, Thyroid Problems, Tuberculosis. Social History Never smoker, Marital Status - Widowed, Alcohol Use - Never, Drug Use - No History, Caffeine Use - Never. Medical History Hematologic/Lymphatic Patient has history of Anemia Cardiovascular Patient has history of  Congestive Heart Failure Endocrine Patient has history of Type II Diabetes Denies history of Type I Diabetes Genitourinary Denies history of End Stage Renal Disease Musculoskeletal Patient has history of Osteoarthritis Oncologic Denies history of Received Chemotherapy, Received Radiation Psychiatric Denies history of Anorexia/bulimia, Confinement Anxiety Patient is treated with Insulin. Blood sugar is tested. Medical And Surgical History Notes Respiratory O2 dependence, chronic resp failure Cardiovascular cardiomyopathy Psychiatric depression Review of Systems (ROS) Constitutional Symptoms (General Health) The patient has no complaints or symptoms. Eyes The patient has no complaints or symptoms. Ear/Nose/Mouth/Throat The patient has no complaints or symptoms. Hematologic/Lymphatic The patient has no complaints or symptoms. Respiratory Deadwyler, Lorelei [name]. ([id number]) The patient has no complaints or symptoms. Cardiovascular Complains or has symptoms of LE edema. Denies complaints or symptoms of Chest pain. Gastrointestinal The patient has no complaints or symptoms. Endocrine The patient has no complaints or symptoms. Genitourinary Complains or has symptoms of Incontinence/dribbling. Denies complaints or symptoms of Kidney failure/ Dialysis. Immunological The patient has no complaints or symptoms.  Integumentary (Skin) The patient has no complaints or symptoms. Musculoskeletal Complains or has symptoms of Muscle Weakness. Neurologic The patient has no complaints or symptoms. Oncologic The patient has no complaints or symptoms. Psychiatric The patient has no complaints or symptoms. Objective Constitutional Pulse regular. Respirations normal and unlabored. Afebrile. Vitals Time Taken: 2:35 PM, Height: 61 in, Source: Stated, Weight: 224 lbs, Source: Stated, BMI: 42.3, Temperature: 98.0 F, Pulse: 72 bpm, Respiratory Rate: 20 breaths/min, Blood Pressure: 117/71  mmHg. Eyes Nonicteric. Reactive to light. Ears, Nose, Mouth, and Throat Lips, teeth, and gums WNL.Marland Kitchen Moist mucosa without lesions . Neck supple and nontender. No palpable supraclavicular or cervical adenopathy. Normal sized without goiter. Respiratory WNL. No retractions.Marland Kitchen Spalla, Jakita [name]. ([id number]) Cardiovascular Pedal Pulses WNL. ABIs could not be measured due to severe tenderness. No clubbing, cyanosis or edema. Gastrointestinal (GI) Abdomen without masses or tenderness.. No liver or spleen enlargement or tenderness.. Musculoskeletal Adexa without tenderness or enlargement.. Digits and nails w/o clubbing, cyanosis, infection, petechiae, ischemia, or inflammatory conditions.Marland Kitchen Psychiatric Judgement and insight Intact.. No evidence of depression, anxiety, or agitation.. General Notes: The patient has [name] undermining wound on the left lateral aspect of the lower extremity with an expose tendon at the base of the ulceration. Surrounding skin does not show any evidence of cellulitis. On her left thigh she has 1 superficial ulceration with some subcutaneous tissue exposed. The other small areas are healing. Integumentary (Hair, Skin) No suspicious lesions. No crepitus or fluctuance. No peri-wound warmth or erythema. No masses.. Wound #1 status is Open. Original cause of wound was Trauma. The wound is located on the Left,Lateral Lower Leg. The wound measures 2cm length x 1cm width x 1cm depth; 1.571cm^2 area and 1.571cm^3 volume. There is tendon exposed. There is no tunneling or undermining noted. There is [name] medium amount of serous drainage noted. The wound margin is flat and intact. There is large (67-100%) red granulation within the wound bed. There is no necrotic tissue within the wound bed. The periwound skin appearance did not exhibit: Callus, Crepitus, Excoriation, Fluctuance, Friable, Induration, Localized Edema, Rash, Scarring, Dry/Scaly, Maceration, Moist, Atrophie Blanche,  Cyanosis, Ecchymosis, Hemosiderin Staining, Mottled, Pallor, Rubor, Erythema. Periwound temperature was noted as No Abnormality. The periwound has tenderness on palpation. Wound #2 status is Open. Original cause of wound was Trauma. The wound is located on the Lateral,Posterior Upper Leg. The wound measures 0.4cm length x 0.9cm width x 0.1cm depth; 0.283cm^2 area and 0.028cm^3 volume. The wound is limited to skin breakdown. There is no tunneling or undermining noted. There is [name] small amount of serous drainage noted. The wound margin is flat and intact. There is medium (34-66%) red granulation within the wound bed. There is [name] medium (34-66%) amount of necrotic tissue within the wound bed including Adherent Slough. The periwound skin appearance did not exhibit: Callus, Crepitus, Excoriation, Fluctuance, Friable, Induration, Localized Edema, Rash, Scarring, Dry/Scaly, Maceration, Moist, Atrophie Blanche, Cyanosis, Ecchymosis, Hemosiderin Staining, Mottled, Pallor, Rubor, Erythema. Periwound temperature was noted as No Abnormality. The periwound has tenderness on palpation. The patient has [name] undermining wound on the left lateral aspect of the lower extremity with an expose tendon at the base of the ulceration. Surrounding skin does not show any evidence of cellulitis. On her left thigh she has 1 superficial ulceration with some subcutaneous tissue exposed. The other small areas are healing. Goedken, Aliesha [name]. ([id number]) Assessment Active Problems ICD-10 E11.622 - Type 2 diabetes mellitus with other skin ulcer S81.812A - Laceration without foreign body, left lower  leg, initial encounter L97.122 - Non-pressure chronic ulcer of left thigh with fat layer exposed E66.01 - Morbid (severe) obesity due to excess calories I have recommended packing the left lower extremity with [name] silver alginate rope and this can be done on alternate days. Would also recommend silver alginate on the left thigh wound.  Offloading is been discussed at length and due to the fact that we cannot get any ABI calculated I recommended arterial studies to be done. I had [name] discussion with the daughter all questions answered and she will come back and see me next week. Plan Wound Cleansing: Wound #1 Left,Lateral Lower Leg: Clean wound with Normal Saline. Wound #2 Lateral,Posterior Upper Leg: Clean wound with Normal Saline. Anesthetic: Wound #1 Left,Lateral Lower Leg: Topical Lidocaine 4% cream applied to wound bed prior to debridement Wound #2 Lateral,Posterior Upper Leg: Topical Lidocaine 4% cream applied to wound bed prior to debridement Skin Barriers/Peri-Wound Care: Wound #1 Left,Lateral Lower Leg: Skin Prep Wound #2 Lateral,Posterior Upper Leg: Skin Prep Primary Wound Dressing: Wound #1 Left,Lateral Lower Leg: Aquacel Ag Wound #2 Lateral,Posterior Upper Leg: Aquacel Ag Secondary Dressing: Wound #1 Left,Lateral Lower Leg: Boardered Foam Dressing Wound #2 Lateral,Posterior Upper Leg: Boardered Foam Dressing Dressing Change Frequency: Heckard, Sundai [name]. ([id number]) Wound #1 Left,Lateral Lower Leg: Change dressing every other day. Wound #2 Lateral,Posterior Upper Leg: Change dressing every other day. Follow-up Appointments: Wound #1 Left,Lateral Lower Leg: Return Appointment in 1 week. Wound #2 Lateral,Posterior Upper Leg: Return Appointment in 1 week. Off-Loading: Wound #1 Left,Lateral Lower Leg: Turn and reposition every 2 hours Wound #2 Lateral,Posterior Upper Leg: Turn and reposition every 2 hours Additional Orders / Instructions: Wound #1 Left,Lateral Lower Leg: Increase protein intake. Wound #2 Lateral,Posterior Upper Leg: Increase protein intake. Services and Therapies ordered were: Arterial Studies- Bilateral - [facility] I have recommended packing the left lower extremity with [name] silver alginate rope and this can be done on alternate days. Would also recommend  silver alginate on the left thigh wound. Offloading is been discussed at length and due to the fact that we cannot get any ABI calculated I recommended arterial studies to be done. I had [name] discussion with the daughter all questions answered and she will come back and see me next week. Electronic Signature(s) Signed: 08/19/2014 12:38:30 PM By: Christin Fudge MD, FACS Previous Signature: 08/13/2014 3:45:00 PM Version By: Christin Fudge MD, FACS Entered By: Christin Fudge on 08/19/2014 12:38:30 Shearer, [name] ([id number]) -------------------------------------------------------------------------------- ROS/PFSH Details Patient Name: Giangrande, Roselie [name]. Date of Service: 08/13/2014 2:30 PM Medical Record Number: [id number] Patient Account Number: [id number] Date of Birth/Sex: April 21, 1934 (80 y.o. Female) Treating RN: Montey Hora Primary Care Physician: Kelton Pillar Other Clinician: Referring Physician: Inocencio Homes Treating Physician/Extender: Frann Rider in Treatment: 0 Information Obtained From Patient Wound History Do you currently have one or more open woundso Yes How many open wounds do you currently haveo 2 Approximately how long have you had your woundso 4 months How have you been treating your wound(s) until nowo silver alginate, packing Has your wound(s) ever healed and then re-openedo No Have you had any lab work done in the past montho No Have you tested positive for an antibiotic resistant organism (MRSA, VRE)o No Have you tested positive for osteomyelitis (bone infection)o No Have you had any tests for circulation on your legso No Cardiovascular Complaints and Symptoms: Positive for: LE edema Negative for: Chest pain Medical History: Positive for: Congestive Heart Failure Past Medical History Notes: cardiomyopathy  Genitourinary Complaints and Symptoms: Positive for: Incontinence/dribbling Negative for: Kidney failure/ Dialysis Medical History: Negative for: End  Stage Renal Disease Musculoskeletal Complaints and Symptoms: Positive for: Muscle Weakness Medical History: Positive for: Osteoarthritis Constitutional Symptoms (General Health) Jarriel, Dharma [name]. ([id number]) Complaints and Symptoms: No Complaints or Symptoms Eyes Complaints and Symptoms: No Complaints or Symptoms Ear/Nose/Mouth/Throat Complaints and Symptoms: No Complaints or Symptoms Hematologic/Lymphatic Complaints and Symptoms: No Complaints or Symptoms Medical History: Positive for: Anemia Respiratory Complaints and Symptoms: No Complaints or Symptoms Medical History: Past Medical History Notes: O2 dependence, chronic resp failure Gastrointestinal Complaints and Symptoms: No Complaints or Symptoms Endocrine Complaints and Symptoms: No Complaints or Symptoms Medical History: Positive for: Type II Diabetes Negative for: Type I Diabetes Treated with: Insulin Blood sugar tested every day: Yes Tested : QID Immunological Complaints and Symptoms: No Complaints or Symptoms Klee, Elnoria [name]. ([id number]) Integumentary (Skin) Complaints and Symptoms: No Complaints or Symptoms Neurologic Complaints and Symptoms: No Complaints or Symptoms Oncologic Complaints and Symptoms: No Complaints or Symptoms Medical History: Negative for: Received Chemotherapy; Received Radiation Psychiatric Complaints and Symptoms: No Complaints or Symptoms Medical History: Negative for: Anorexia/bulimia; Confinement Anxiety Past Medical History Notes: depression Family and Social History Cancer: Yes - Siblings, Maternal Grandparents; Diabetes: Yes - Siblings; Heart Disease: Yes - Siblings; Hereditary Spherocytosis: No; Hypertension: Yes - Siblings; Lung Disease: Yes - Siblings; Seizures: No; Stroke: No; Thyroid Problems: No; Tuberculosis: No; Never smoker; Marital Status - Widowed; Alcohol Use: Never; Drug Use: No History; Caffeine Use: Never; Financial Concerns: No; Food, Clothing or  Shelter Needs: No; Support System Lacking: No; Transportation Concerns: No; Medical Power of Attorney: Yes - Engineering geologist (Copy provided) Physician Affirmation I have reviewed and agree with the above information. Electronic Signature(s) Signed: 08/13/2014 3:45:00 PM By: Christin Fudge MD, FACS Signed: 08/13/2014 5:25:33 PM By: Montey Hora Entered By: Christin Fudge on 08/13/2014 15:03:19 Sease, [name] ([id number]) -------------------------------------------------------------------------------- SuperBill Details Patient Name: Uptain, Timiya [name]. Date of Service: 08/13/2014 Medical Record Number: [id number] Patient Account Number: [id number] Date of Birth/Sex: 01-17-1935 (80 y.o. 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 E66.01 Morbid (severe) obesity due to excess calories Facility Procedures CPT4 Code: 51700174 Description: 99214 - WOUND CARE VISIT-LEV 4 EST PT Modifier: Quantity: 1 Physician Procedures CPT4 Code Description: 9449675 91638 - WC PHYS LEVEL 4 - NEW PT ICD-10 Description Diagnosis E11.622 Type 2 diabetes mellitus with other skin ulcer S81.812A Laceration without foreign body, left lower leg, ini L97.122 Non-pressure chronic ulcer of left  thigh with fat la E66.01 Morbid (severe) obesity due to excess calories Modifier: tial encounter yer exposed Quantity: 1 Electronic Signature(s) Signed: 08/13/2014 3:45:00 PM By: Christin Fudge MD, FACS Entered By: Christin Fudge on 08/13/2014 15:36:53

## 2014-08-14 NOTE — Progress Notes (Signed)
TATESherelle, Castelli ([id number]) Visit Report for 08/13/2014 Allergy List Details Patient Name: Rawlins, Liane A. Date of Service: 08/13/2014 2:30 PM Medical Record Number: [id number] Patient Account Number: [id number] Date of Birth/Sex: 07/30/1934 (80 y.o. Female) Treating RN: Montey Hora Primary Care Physician: Kelton Pillar Other Clinician: Referring Physician: Inocencio Homes Treating Physician/Extender: Frann Rider in Treatment: 0 Allergies Active Allergies Levaquin tetanus toxoid,fluid latex penicillin Allergy Notes Electronic Signature(s) Signed: 08/13/2014 5:25:33 PM By: Montey Hora Entered By: Montey Hora on 08/13/2014 14:24:50 Arango, [name] ([id number]) -------------------------------------------------------------------------------- Arrival Information Details Patient Name: Hagemeister, Brittnye A. Date of Service: 08/13/2014 2:30 PM Medical Record Number: [id number] Patient Account Number: [id number] Date of Birth/Sex: Sep 07, 1934 (80 y.o. Female) Treating RN: Montey Hora Primary Care Physician: Kelton Pillar Other Clinician: Referring Physician: Inocencio Homes Treating Physician/Extender: Frann Rider in Treatment: 0 Visit Information Patient Arrived: Stretcher Arrival Time: 14:05 Accompanied By: dtr Transfer Assistance: Manual Patient Identification Verified: Yes Secondary Verification Process Yes Completed: Patient Has Alerts: Yes Patient Alerts: DMII Electronic Signature(s) Signed: 08/13/2014 5:25:33 PM By: Montey Hora Entered By: Montey Hora on 08/13/2014 14:23:43 [place], [name] ([id number]) -------------------------------------------------------------------------------- Clinic Level of Care Assessment Details Patient Name: Pop, Finlee A. Date of Service: 08/13/2014 2:30 PM Medical Record Number: [id number] Patient Account Number: [id number] Date of Birth/Sex: Jan 06, 1935 (80 y.o. Female) Treating RN: Montey Hora Primary Care Physician:  Kelton Pillar Other Clinician: Referring Physician: Inocencio Homes Treating Physician/Extender: Frann Rider in Treatment: 0 Clinic Level of Care Assessment Items TOOL 2 Quantity Score []  - Use when only an EandM is performed on the INITIAL visit 0 ASSESSMENTS - Nursing Assessment / Reassessment X - General Physical Exam (combine w/ comprehensive assessment (listed just 1 20 below) when performed on new pt. evals) X - Comprehensive Assessment (HX, ROS, Risk Assessments, Wounds Hx, etc.) 1 25 ASSESSMENTS - Wound and Skin Assessment / Reassessment []  - Simple Wound Assessment / Reassessment - one wound 0 X - Complex Wound Assessment / Reassessment - multiple wounds 2 5 []  - Dermatologic / Skin Assessment (not related to wound area) 0 ASSESSMENTS - Ostomy and/or Continence Assessment and Care []  - Incontinence Assessment and Management 0 []  - Ostomy Care Assessment and Management (repouching, etc.) 0 PROCESS - Coordination of Care X - Simple Patient / Family Education for ongoing care 1 15 []  - Complex (extensive) Patient / Family Education for ongoing care 0 X - Staff obtains Programmer, systems, Records, Test Results / Process Orders 1 10 []  - Staff telephones HHA, Nursing Homes / Clarify orders / etc 0 []  - Routine Transfer to another Facility (non-emergent condition) 0 []  - Routine Hospital Admission (non-emergent condition) 0 X - New Admissions / Biomedical engineer / Ordering NPWT, Apligraf, etc. 1 15 []  - Emergency Hospital Admission (emergent condition) 0 X - Simple Discharge Coordination 1 10 Wedig, Rumor A. ([id number]) []  - Complex (extensive) Discharge Coordination 0 PROCESS - Special Needs []  - Pediatric / Minor Patient Management 0 []  - Isolation Patient Management 0 []  - Hearing / Language / Visual special needs 0 []  - Assessment of Community assistance (transportation, D/C planning, etc.) 0 []  - Additional assistance / Altered mentation 0 []  - Support Surface(s)  Assessment (bed, cushion, seat, etc.) 0 INTERVENTIONS - Wound Cleansing / Measurement X - Wound Imaging (photographs - any number of wounds) 1 5 []  - Wound Tracing (instead of photographs) 0 []  - Simple Wound Measurement - one wound 0 X - Complex Wound Measurement - multiple wounds 2 5 []  - Simple  Wound Cleansing - one wound 0 X - Complex Wound Cleansing - multiple wounds 2 5 INTERVENTIONS - Wound Dressings X - Small Wound Dressing one or multiple wounds 2 10 []  - Medium Wound Dressing one or multiple wounds 0 []  - Large Wound Dressing one or multiple wounds 0 []  - Application of Medications - injection 0 INTERVENTIONS - Miscellaneous []  - External ear exam 0 []  - Specimen Collection (cultures, biopsies, blood, body fluids, etc.) 0 []  - Specimen(s) / Culture(s) sent or taken to Lab for analysis 0 []  - Patient Transfer (multiple staff / Harrel Lemon Lift / Similar devices) 0 []  - Simple Staple / Suture removal (25 or less) 0 []  - Complex Staple / Suture removal (26 or more) 0 Brickey, Maretta A. ([id number]) []  - Hypo / Hyperglycemic Management (close monitor of Blood Glucose) 0 []  - Ankle / Brachial Index (ABI) - do not check if billed separately 0 Has the patient been seen at the hospital within the last three years: Yes Total Score: 150 Level Of Care: New/Established - Level 4 Electronic Signature(s) Signed: 08/13/2014 5:25:33 PM By: Montey Hora Entered By: Montey Hora on 08/13/2014 15:23:11 Melucci, [name] ([id number]) -------------------------------------------------------------------------------- Encounter Discharge Information Details Patient Name: Dralle, Jadea A. Date of Service: 08/13/2014 2:30 PM Medical Record Number: [id number] Patient Account Number: [id number] Date of Birth/Sex: Sep 18, 1934 (80 y.o. Female) Treating RN: Montey Hora Primary Care Physician: Kelton Pillar Other Clinician: Referring Physician: Inocencio Homes Treating Physician/Extender: Frann Rider  in Treatment: 0 Encounter Discharge Information Items Discharge Pain Level: 0 Discharge Condition: Stable Ambulatory Status: Stretcher Nursing Discharge Destination: Home Transportation: Ambulance Accompanied By: dtr Schedule Follow-up Appointment: Yes Medication Reconciliation completed No and provided to Patient/Care Carsyn Boster: Clinical Summary of Care: Electronic Signature(s) Signed: 08/13/2014 5:25:33 PM By: Montey Hora Entered By: Montey Hora on 08/13/2014 15:25:15 [place]rs, [name] ([id number]) -------------------------------------------------------------------------------- Lower Extremity Assessment Details Patient Name: Chagnon, Deneka A. Date of Service: 08/13/2014 2:30 PM Medical Record Number: [id number] Patient Account Number: [id number] Date of Birth/Sex: [birth year] (80 y.o. Female) Treating RN: Montey Hora Primary Care Physician: Kelton Pillar Other Clinician: Referring Physician: Inocencio Homes Treating Physician/Extender: Frann Rider in Treatment: 0 Edema Assessment Assessed: [Left: No] [Right: No] E[Left: dema] [Right: :] Calf Left: Right: Point of Measurement: 37 cm From Medial Instep 40.5 cm 44.3 cm Ankle Left: Right: Point of Measurement: 10 cm From Medial Instep 21 cm 21.4 cm Vascular Assessment Pulses: Posterior Tibial Palpable: [Left:No] Doppler: [Left:Monophasic] [Right:Monophasic] Dorsalis Pedis Palpable: [Left:No] [Right:No] Doppler: [Left:Monophasic] [Right:Monophasic] Extremity colors, hair growth, and conditions: Extremity Color: [Left:Hyperpigmented] [Right:Hyperpigmented] Hair Growth on Extremity: [Left:No] [Right:No] Temperature of Extremity: [Left:Warm] [Right:Warm] Capillary Refill: [Left:< 3 seconds] [Right:< 3 seconds] Toe Nail Assessment Left: Right: Thick: No No Discolored: No No Deformed: No No Improper Length and Hygiene: No Notes Unable to get ABI dt patient screaming, it hurts. Fetherolf, LAKEETA DOBOSZ  ([id number]) Electronic Signature(s) Signed: 08/13/2014 5:25:33 PM By: Montey Hora Entered By: Montey Hora on 08/13/2014 15:06:56 Storey, [name] ([id number]) -------------------------------------------------------------------------------- Multi Wound Chart Details Patient Name: Leopard, Soma A. Date of Service: 08/13/2014 2:30 PM Medical Record Number: [id number] Patient Account Number: [id number] Date of Birth/Sex: [birth year] (80 y.o. Female) Treating RN: Montey Hora Primary Care Physician: Kelton Pillar Other Clinician: Referring Physician: Inocencio Homes Treating Physician/Extender: Frann Rider in Treatment: 0 Vital Signs Height(in): 61 Pulse(bpm): 72 Weight(lbs): 224 Blood Pressure 117/71 (mmHg): Body Mass Index(BMI): 42 Temperature(F): 98.0 Respiratory Rate 20 (breaths/min): Photos: [1:No Photos] [2:No Photos] [N/A:N/A] Wound Location: [1:Left, Lateral Lower  Leg] [2:Lateral, Posterior Upper Leg] [N/A:N/A] Wounding Event: [1:Trauma] [2:Trauma] [N/A:N/A] Primary Etiology: [1:Trauma, Other] [2:Trauma, Other] [N/A:N/A] Date Acquired: [1:04/26/2014] [2:06/11/2014] [N/A:N/A] Weeks of Treatment: [1:0] [2:0] [N/A:N/A] Wound Status: [1:Open] [2:Open] [N/A:N/A] Measurements L x W x D 2x1x1 [2:0.4x0.9x0.1] [N/A:N/A] (cm) Area (cm) : [1:1.571] [2:0.283] [N/A:N/A] Volume (cm) : [1:1.571] [9:5.093] [N/A:N/A] Periwound Skin Texture: No Abnormalities Noted [2:No Abnormalities Noted] [N/A:N/A] Periwound Skin [1:No Abnormalities Noted] [2:No Abnormalities Noted] [N/A:N/A] Moisture: Periwound Skin Color: No Abnormalities Noted [2:No Abnormalities Noted] [N/A:N/A] Tenderness on [1:No] [2:No] [N/A:N/A] Treatment Notes Electronic Signature(s) Signed: 08/13/2014 5:25:33 PM By: Montey Hora Entered By: Montey Hora on 08/13/2014 15:20:23 Sugrue, [name] ([id number]) -------------------------------------------------------------------------------- Multi-Disciplinary Care  Plan Details Patient Name: Eduardo, Tysheena A. Date of Service: 08/13/2014 2:30 PM Medical Record Number: [id number] Patient Account Number: [id number] Date of Birth/Sex: 1934/03/23 (80 y.o. Female) Treating RN: Montey Hora Primary Care Physician: Kelton Pillar Other Clinician: Referring Physician: Inocencio Homes Treating Physician/Extender: Frann Rider in Treatment: 0 Active Inactive Abuse / Safety / Falls / Self Care Management Nursing Diagnoses: Potential for falls Goals: Patient will remain injury free Date Initiated: 08/13/2014 Goal Status: Active Interventions: Assess fall risk on admission and as needed Notes: Nutrition Nursing Diagnoses: Potential for alteratiion in Nutrition/Potential for imbalanced nutrition Goals: Patient/caregiver verbalizes understanding of need to maintain therapeutic glucose control per primary care physician Date Initiated: 08/13/2014 Goal Status: Active Interventions: Assess patient nutrition upon admission and as needed per policy Notes: Orientation to the Wound Care Program Nursing Diagnoses: Knowledge deficit related to the wound healing center program Goals: Patient/caregiver will verbalize understanding of the [place], Alvin. ([id number]) Date Initiated: 08/13/2014 Goal Status: Active Interventions: Provide education on orientation to the wound center Notes: Pain, Acute or Chronic Nursing Diagnoses: Pain, acute or chronic: actual or potential Goals: Patient will verbalize adequate pain control and receive pain control interventions during procedures as needed Date Initiated: 08/13/2014 Goal Status: Active Interventions: Provide education on pain management Notes: Wound/Skin Impairment Nursing Diagnoses: Impaired tissue integrity Goals: Ulcer/skin breakdown will have a volume reduction of 30% by week 4 Date Initiated: 08/13/2014 Goal Status: Active Interventions: Assess ulceration(s) every  visit Notes: Electronic Signature(s) Signed: 08/13/2014 5:25:33 PM By: Montey Hora Entered By: Montey Hora on 08/13/2014 15:19:13 Motter, [name] ([id number]) -------------------------------------------------------------------------------- Patient/Caregiver Education Details Patient Name: Heather, Gaspar Skeeters A. Date of Service: 08/13/2014 2:30 PM Medical Record Number: [id number] Patient Account Number: [id number] Date of Birth/Gender: 1934/11/21 (80 y.o. Female) Treating RN: Montey Hora Primary Care Physician: Kelton Pillar Other Clinician: Referring Physician: Inocencio Homes Treating Physician/Extender: Frann Rider in Treatment: 0 Education Assessment Education Provided To: Patient and Caregiver Education Topics Provided Nutrition: Handouts: Other: nutrition and wound healing Methods: Demonstration, Explain/Verbal Responses: State content correctly Offloading: Handouts: Other: importance of offloading Methods: Demonstration, Explain/Verbal Responses: State content correctly Wound/Skin Impairment: Handouts: Other: wound care as ordered Methods: Demonstration, Explain/Verbal Responses: State content correctly Electronic Signature(s) Signed: 08/13/2014 5:25:33 PM By: Montey Hora Entered By: Montey Hora on 08/13/2014 15:24:25 Purnell, Taelyr A. ([id number]) -------------------------------------------------------------------------------- Wound Assessment Details Patient Name: Lamorte, Imaan A. Date of Service: 08/13/2014 2:30 PM Medical Record Number: [id number] Patient Account Number: [id number] Date of Birth/Sex: [birth year] (80 y.o. Female) Treating RN: Montey Hora Primary Care Physician: Kelton Pillar Other Clinician: Referring Physician: Inocencio Homes Treating Physician/Extender: Frann Rider in Treatment: 0 Wound Status Wound Number: 1 Primary Trauma, Other Etiology: Wound Location: Left Lower Leg - Lateral Wound Open Wounding Event:  Trauma Status: Date Acquired: 04/26/2014 Comorbid Anemia,  Congestive Heart Failure, Weeks Of Treatment: 0 History: Type II Diabetes, Osteoarthritis Clustered Wound: No Photos Photo Uploaded By: Montey Hora on 08/13/2014 16:49:59 Wound Measurements Length: (cm) 2 % Reduction Width: (cm) 1 % Reduction Depth: (cm) 1 Epithelializ Area: (cm) 1.571 Tunneling: Volume: (cm) 1.571 Undermining in Area: 0% in Volume: 0% ation: None No : No Wound Description Full Thickness With Exposed Foul Odor Af Classification: Support Structures Diabetic Severity Grade 2 (Wagner): Wound Margin: Flat and Intact Exudate Amount: Medium Exudate Type: Serous Exudate Color: amber ter Cleansing: No Wound Bed Granulation Amount: Large (67-100%) Exposed Structure Granulation Quality: Red Fascia Exposed: No Kirkey, Svea A. ([id number]) Necrotic Amount: None Present (0%) Fat Layer Exposed: No Tendon Exposed: Yes Muscle Exposed: No Joint Exposed: No Bone Exposed: No Periwound Skin Texture Texture Color No Abnormalities Noted: No No Abnormalities Noted: No Callus: No Atrophie Blanche: No Crepitus: No Cyanosis: No Excoriation: No Ecchymosis: No Fluctuance: No Erythema: No Friable: No Hemosiderin Staining: No Induration: No Mottled: No Localized Edema: No Pallor: No Rash: No Rubor: No Scarring: No Temperature / Pain Moisture Temperature: No Abnormality No Abnormalities Noted: No Tenderness on Palpation: Yes Dry / Scaly: No Maceration: No Moist: No Wound Preparation Ulcer Cleansing: Rinsed/Irrigated with Saline Topical Anesthetic Applied: Other: lidocaine 4%, Treatment Notes Wound #1 (Left, Lateral Lower Leg) 1. Cleansed with: Clean wound with Normal Saline 2. Anesthetic Topical Lidocaine 4% cream to wound bed prior to debridement 4. Dressing Applied: Aquacel Ag 5. Secondary Dressing Applied Bordered Foam Dressing Electronic Signature(s) Signed: 08/13/2014 3:41:16 PM  By: Montey Hora Entered By: Montey Hora on 08/13/2014 15:41:16 Ramthun, [name] ([id number]) -------------------------------------------------------------------------------- Wound Assessment Details Patient Name: Herman, Chiann A. Date of Service: 08/13/2014 2:30 PM Medical Record Number: [id number] Patient Account Number: [id number] Date of Birth/Sex: 1934/10/01 (80 y.o. Female) Treating RN: Montey Hora Primary Care Physician: Kelton Pillar Other Clinician: Referring Physician: Inocencio Homes Treating Physician/Extender: Frann Rider in Treatment: 0 Wound Status Wound Number: 2 Primary Trauma, Other Etiology: Wound Location: Upper Leg - Lateral, Posterior Wound Open Wounding Event: Trauma Status: Date Acquired: 06/11/2014 Comorbid Anemia, Congestive Heart Failure, Weeks Of Treatment: 0 History: Type II Diabetes, Osteoarthritis Clustered Wound: No Photos Photo Uploaded By: Montey Hora on 08/13/2014 16:50:00 Wound Measurements Length: (cm) 0.4 Width: (cm) 0.9 Depth: (cm) 0.1 Area: (cm) 0.283 Volume: (cm) 0.028 % Reduction in Area: 0% % Reduction in Volume: 0% Epithelialization: Small (1-33%) Tunneling: No Undermining: No Wound Description Classification: Partial Thickness Foul Odor Af Diabetic Severity (Wagner): Grade 1 Wound Margin: Flat and Intact Exudate Amount: Small Exudate Type: Serous Exudate Color: amber ter Cleansing: No Wound Bed Granulation Amount: Medium (34-66%) Exposed Structure Granulation Quality: Red Fascia Exposed: No Necrotic Amount: Medium (34-66%) Fat Layer Exposed: No Dockendorf, Syncere A. ([id number]) Necrotic Quality: Adherent Slough Tendon Exposed: No Muscle Exposed: No Joint Exposed: No Bone Exposed: No Limited to Skin Breakdown Periwound Skin Texture Texture Color No Abnormalities Noted: No No Abnormalities Noted: No Callus: No Atrophie Blanche: No Crepitus: No Cyanosis: No Excoriation: No Ecchymosis:  No Fluctuance: No Erythema: No Friable: No Hemosiderin Staining: No Induration: No Mottled: No Localized Edema: No Pallor: No Rash: No Rubor: No Scarring: No Temperature / Pain Moisture Temperature: No Abnormality No Abnormalities Noted: No Tenderness on Palpation: Yes Dry / Scaly: No Maceration: No Moist: No Wound Preparation Ulcer Cleansing: Rinsed/Irrigated with Saline Topical Anesthetic Applied: Other: lidocaine 4%, Treatment Notes Wound #2 (Lateral, Posterior Upper Leg) 1. Cleansed with: Clean wound with Normal Saline 2. Anesthetic Topical Lidocaine  4% cream to wound bed prior to debridement 4. Dressing Applied: Aquacel Ag 5. Secondary Dressing Applied Bordered Foam Dressing Electronic Signature(s) Signed: 08/13/2014 3:42:30 PM By: Montey Hora Entered By: Montey Hora on 08/13/2014 15:42:30 Picazo, [name] ([id number]) -------------------------------------------------------------------------------- Vitals Details Patient Name: Tally, Donye A. Date of Service: 08/13/2014 2:30 PM Medical Record Number: [id number] Patient Account Number: [id number] Date of Birth/Sex: 03/08/1934 (81 y.o. Female) Treating RN: Montey Hora Primary Care Physician: Kelton Pillar Other Clinician: Referring Physician: Inocencio Homes Treating Physician/Extender: Frann Rider in Treatment: 0 Vital Signs Time Taken: 14:35 Temperature (F): 98.0 Height (in): 61 Pulse (bpm): 72 Source: Stated Respiratory Rate (breaths/min): 20 Weight (lbs): 224 Blood Pressure (mmHg): 117/71 Source: Stated Reference Range: 80 - 120 mg / dl Body Mass Index (BMI): 42.3 Electronic Signature(s) Signed: 08/13/2014 5:25:33 PM By: Montey Hora Entered By: Montey Hora on 08/13/2014 14:47:24

## 2014-08-14 NOTE — Progress Notes (Signed)
TATETakia, Runyon ([id number]) Visit Report for 08/13/2014 Abuse/Suicide Risk Screen Details Patient Name: [name], [name] Date of Service: 08/13/2014 2:30 PM Medical Record Number: [id number] Patient Account Number: [id number] Date of Birth/Sex: 1934/03/13 (80 y.o. Female) Treating RN: Montey Hora Primary Care Physician: Kelton Pillar Other Clinician: Referring Physician: Inocencio Homes Treating Physician/Extender: Frann Rider in Treatment: 0 Abuse/Suicide Risk Screen Items Answer ABUSE/SUICIDE RISK SCREEN: Has anyone close to you tried to hurt or harm you recentlyo No Do you feel uncomfortable with anyone in your familyo No Has anyone forced you do things that you didnot want to doo No Do you have any thoughts of harming yourselfo No Patient displays signs or symptoms of abuse and/or neglect. No Electronic Signature(s) Signed: 08/13/2014 5:25:33 PM By: Montey Hora Entered By: Montey Hora on 08/13/2014 14:31:49 [name], [name] ([id number]) -------------------------------------------------------------------------------- Activities of Daily Living Details Patient Name: [name], [name] Date of Service: 08/13/2014 2:30 PM Medical Record Number: [id number] Patient Account Number: [id number] Date of Birth/Sex: [birth year] (80 y.o. Female) Treating RN: Montey Hora Primary Care Physician: Kelton Pillar Other Clinician: Referring Physician: Inocencio Homes Treating Physician/Extender: Frann Rider in Treatment: 0 Activities of Daily Living Items Answer Activities of Daily Living (Please select one for each item) Drive Automobile Not Able Take Medications Need Assistance Use Telephone Completely Able Care for Appearance Need Assistance Use Toilet Need Assistance Bath / Shower Need Assistance Dress Self Need Assistance Feed Self Need Assistance Walk Need Assistance Get In / Out Bed Need Assistance Housework Not Able Prepare Meals Not Able Handle Money Need  Assistance Shop for Self Need Assistance Electronic Signature(s) Signed: 08/13/2014 5:25:33 PM By: Montey Hora Entered By: Montey Hora on 08/13/2014 14:32:50 [name], [name] ([id number]) -------------------------------------------------------------------------------- Education Assessment Details Patient Name: [name], [name] Date of Service: 08/13/2014 2:30 PM Medical Record Number: [id number] Patient Account Number: [id number] Date of Birth/Sex: Jul 17, 1934 (80 y.o. Female) Treating RN: Montey Hora Primary Care Physician: Kelton Pillar Other Clinician: Referring Physician: Inocencio Homes Treating Physician/Extender: Frann Rider in Treatment: 0 Primary Learner Assessed: Patient Learning Preferences/Education Level/Primary Language Learning Preference: Explanation, Demonstration Highest Education Level: High School Preferred Language: [name] Cognitive Barrier Assessment/Beliefs Language Barrier: No Translator Needed: No Memory Deficit: No Emotional Barrier: No Cultural/Religious Beliefs Affecting Medical No Care: Physical Barrier Assessment Impaired Vision: No Impaired Hearing: No Decreased Hand dexterity: Yes Knowledge/Comprehension Assessment Knowledge Level: Low Comprehension Level: Low Ability to understand written Low instructions: Ability to understand verbal Low instructions: Motivation Assessment Anxiety Level: Calm Cooperation: Cooperative Education Importance: Acknowledges Need Interest in Health Problems: Asks Questions Perception: Coherent Willingness to Engage in Self- Medium Management Activities: Readiness to Engage in Self- Medium Management Activities: Electronic Signature(s) [name], [name] ([id number]) Signed: 08/13/2014 5:25:33 PM By: Montey Hora Entered By: Montey Hora on 08/13/2014 14:33:54 [name], [name] ([id number]) -------------------------------------------------------------------------------- Fall Risk Assessment  Details Patient Name: [name], [name] Date of Service: 08/13/2014 2:30 PM Medical Record Number: [id number] Patient Account Number: [id number] Date of Birth/Sex: 02-Feb-1935 (80 y.o. Female) Treating RN: Montey Hora Primary Care Physician: Kelton Pillar Other Clinician: Referring Physician: Inocencio Homes Treating Physician/Extender: Frann Rider in Treatment: 0 Fall Risk Assessment Items FALL RISK ASSESSMENT: History of falling - immediate or within 3 months 25 Yes Secondary diagnosis 0 No Ambulatory aid None/bed rest/wheelchair/nurse 0 Yes Crutches/cane/walker 0 No Furniture 0 No IV Access/Saline Lock 0 No Gait/Training Normal/bed rest/immobile 0 No Weak 10 Yes Impaired 0 No Mental Status Oriented to own ability 0 Yes Electronic Signature(s) Signed: 08/13/2014 5:25:33  PM By: Montey Hora Entered By: Montey Hora on 08/13/2014 14:34:16 [name], [name] ([id number]) -------------------------------------------------------------------------------- Nutrition Risk Assessment Details Patient Name: [name], [name] Date of Service: 08/13/2014 2:30 PM Medical Record Number: [id number] Patient Account Number: [id number] Date of Birth/Sex: 1934/04/06 (80 y.o. Female) Treating RN: Montey Hora Primary Care Physician: Kelton Pillar Other Clinician: Referring Physician: Inocencio Homes Treating Physician/Extender: Frann Rider in Treatment: 0 Height (in): Weight (lbs): Body Mass Index (BMI): Nutrition Risk Assessment Items NUTRITION RISK SCREEN: I have an illness or condition that made me change the kind and/or 0 No amount of food I eat I eat fewer than two meals per day 3 Yes I eat few fruits and vegetables, or milk products 2 Yes I have three or more drinks of beer, liquor or wine almost every day 0 No I have tooth or mouth problems that make it hard for me to eat 0 No I don't always have enough money to buy the food I need 0 No I eat alone most of the time  0 No I take three or more different prescribed or over-the-counter drugs a 1 Yes day Without wanting to, I have lost or gained 10 pounds in the last six 0 No months I am not always physically able to shop, cook and/or feed myself 0 No Nutrition Protocols Good Risk Protocol Moderate Risk Protocol Provide education on High Risk Proctocol 0 Electronic Signature(s) Signed: 08/13/2014 5:25:33 PM By: Montey Hora Entered By: Montey Hora on 08/13/2014 14:35:22

## 2014-08-16 ENCOUNTER — Other Ambulatory Visit: Payer: Self-pay | Admitting: *Deleted

## 2014-08-16 DIAGNOSIS — M79606 Pain in leg, unspecified: Secondary | ICD-10-CM

## 2014-08-19 ENCOUNTER — Encounter (HOSPITAL_COMMUNITY): Payer: Medicare Other

## 2014-08-20 ENCOUNTER — Encounter: Payer: Medicare Other | Admitting: Surgery

## 2014-08-20 DIAGNOSIS — L97122 Non-pressure chronic ulcer of left thigh with fat layer exposed: Secondary | ICD-10-CM | POA: Diagnosis not present

## 2014-08-21 NOTE — Progress Notes (Signed)
DESTENI, PISCOPO ([id number]) Visit Report for 08/20/2014 Chief Complaint Document Details Patient Name: [name], [name] 08/20/2014 10:15 Date of Service: AM Medical Record [id number] Number: Patient Account Number: [id number] 1934-09-08 (80 y.o. Treating RN: Date of Birth/Sex: Female) Other Clinician: Primary Care Physician: Kelton Pillar Treating Christin Fudge Referring Physician: Kelton Pillar Physician/Extender: Weeks in Treatment: 1 Information Obtained from: Patient Chief Complaint Patient presents to the wound care center for a consult due non healing wound. ulcerated area on her left lower extremity and her left thigh for about 3 months. Electronic Signature(s) Signed: 08/20/2014 10:50:39 AM By: Christin Fudge MD, FACS Entered By: Christin Fudge on 08/20/2014 10:50:38 [name], [name] ([id number]) -------------------------------------------------------------------------------- HPI Details Patient Name: Samantha Leyden A. 08/20/2014 10:15 Date of Service: AM Medical Record [id number] Number: Patient Account Number: [id number] 07-Jan-1935 (80 y.o. Treating RN: Date of Birth/Sex: Female) Other Clinician: Primary Care Physician: Kelton Pillar Treating Christin Fudge Referring Physician: Kelton Pillar Physician/Extender: Weeks in Treatment: 1 History of Present Illness Location: left lower extremity and left thigh Quality: Patient reports experiencing a sharp pain to affected area(s). Severity: Patient states wound are getting worse. Duration: Patient has had the wound for > 3 months prior to seeking treatment at the wound center Timing: Pain in wound is Intermittent (comes and goes Context: The wound occurred when the patient had a fall in the skilled nursing facility and injured her left lateral lower extremity. Modifying Factors: Consults to this date include: orthopedics where she had a cast applied initially and also has gone to the ER a few times. Associated Signs and  Symptoms: Patient reports having difficulty standing and is in bed for long periods. HPI Description: The patient was taken to the ED on 07/30/14 by her daughter for a worsening wound on her left lower lateral leg which has been there for a while. Xray of the left lower extremity -IMPRESSION:Healing oblique distal tibial fracture. Soft tissue injury along the lateral aspect of the mid/distal fibula. No radiographic findings to suggest acute osteomyelitis. The patient was put on clindamycin and given some wound care. Her last hemoglobin A1c was 8.8. opast medical history significant for -- diabetes mellitus type 2, dyspnea, hypertension, chronic constipation, CHF, history of ankle fracture, vaginal hysterectomy, abdomen and pain on and off, status post flexible sigmoidoscopy. Electronic Signature(s) Signed: 08/20/2014 10:50:46 AM By: Christin Fudge MD, FACS Entered By: Christin Fudge on 08/20/2014 10:50:46 [name], [name] ([id number]) -------------------------------------------------------------------------------- Physical Exam Details Patient Name: Samantha Leyden A. 08/20/2014 10:15 Date of Service: AM Medical Record [id number] Number: Patient Account Number: [id number] 27-Apr-1934 (80 y.o. Treating RN: Date of Birth/Sex: Female) Other Clinician: Primary Care Physician: Kelton Pillar Treating Christin Fudge Referring Physician: Kelton Pillar Physician/Extender: Weeks in Treatment: 1 Constitutional . Pulse regular. Respirations normal and unlabored. Afebrile. . Eyes Nonicteric. Reactive to light. Ears, Nose, Mouth, and Throat Lips, teeth, and gums WNL.Marland Kitchen Moist mucosa without lesions . Neck supple and nontender. No palpable supraclavicular or cervical adenopathy. Normal sized without goiter. Respiratory WNL. No retractions.. Cardiovascular Pedal Pulses WNL. No clubbing, cyanosis or edema. Chest Breasts symmetical and no nipple discharge.. Breast tissue WNL, no masses, lumps, or  tenderness.. Musculoskeletal Adexa without tenderness or enlargement.. Digits and nails w/o clubbing, cyanosis, infection, petechiae, ischemia, or inflammatory conditions.. Integumentary (Hair, Skin) No suspicious lesions. No crepitus or fluctuance. No peri-wound warmth or erythema. No masses.Marland Kitchen Psychiatric Judgement and insight Intact.. No evidence of depression, anxiety, or agitation.. Notes The patient's left lateral lower extremity wound is very clean with no evidence  of any discharge. She still has undermining and the exposed tendon and will continue using silver alginate in this area. The one on the thigh looks about the same. Electronic Signature(s) Signed: 08/20/2014 10:52:00 AM By: Christin Fudge MD, FACS Entered By: Christin Fudge on 08/20/2014 10:52:00 [name], [name] ([id number]) -------------------------------------------------------------------------------- Physician Orders Details Patient Name: Samantha Leyden A. 08/20/2014 10:15 Date of Service: AM Medical Record [id number] Number: Patient Account Number: [id number] February 02, 1935 (80 y.o. Treating RN: Montey Hora Date of Birth/Sex: Female) Other Clinician: Primary Care Physician: Leretha Dykes Referring Physician: Kelton Pillar Physician/Extender: Suella Grove in Treatment: 1 Verbal / Phone Orders: Yes Clinician: Montey Hora Read Back and Verified: Yes Diagnosis Coding Wound Cleansing Wound #1 Left,Lateral Lower Leg o Clean wound with Normal Saline. Wound #2 Lateral,Posterior Upper Leg o Clean wound with Normal Saline. Anesthetic Wound #1 Left,Lateral Lower Leg o Topical Lidocaine 4% cream applied to wound bed prior to debridement Wound #2 Lateral,Posterior Upper Leg o Topical Lidocaine 4% cream applied to wound bed prior to debridement Skin Barriers/Peri-Wound Care Wound #1 Left,Lateral Lower Leg o Skin Prep Wound #2 Lateral,Posterior Upper Leg o Skin Prep Primary Wound  Dressing Wound #1 Left,Lateral Lower Leg o Aquacel Ag Wound #2 Lateral,Posterior Upper Leg o Aquacel Ag Secondary Dressing Wound #1 Left,Lateral Lower Leg o Boardered Foam Dressing Wound #2 Lateral,Posterior Upper Leg o Boardered Foam Dressing [name], [name] ([id number]) Dressing Change Frequency Wound #1 Left,Lateral Lower Leg o Change dressing every other day. Wound #2 Lateral,Posterior Upper Leg o Change dressing every other day. Follow-up Appointments Wound #1 Left,Lateral Lower Leg o Return Appointment in 1 week. Wound #2 Lateral,Posterior Upper Leg o Return Appointment in 1 week. Off-Loading Wound #1 Left,Lateral Lower Leg o Turn and reposition every 2 hours Wound #2 Lateral,Posterior Upper Leg o Turn and reposition every 2 hours Additional Orders / Instructions Wound #1 Left,Lateral Lower Leg o Increase protein intake. Wound #2 Lateral,Posterior Upper Leg o Increase protein intake. Electronic Signature(s) Signed: 08/20/2014 12:24:37 PM By: Christin Fudge MD, FACS Signed: 08/20/2014 5:27:11 PM By: Montey Hora Entered By: Montey Hora on 08/20/2014 10:48:16 [name], [name] ([id number]) -------------------------------------------------------------------------------- Problem List Details Patient Name: Samantha Leyden A. 08/20/2014 10:15 Date of Service: AM Medical Record [id number] Number: Patient Account Number: [id number] 1934/06/06 (80 y.o. 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 08/20/2014 10:15 Date of Service: AM Medical Record [id number] Number: Patient Account Number: [id number] [birth year] (80 y.o. Treating RN: Date of Birth/Sex: Female) Other Clinician: Primary Care Physician: Kelton Pillar Treating Christin Fudge Referring Physician: Kelton Pillar Physician/Extender: Weeks in Treatment: 1 Subjective Chief Complaint Information obtained from Patient Patient presents to the wound care center for a consult due non healing wound. ulcerated area on her left lower extremity and her left thigh for about 3 months. History of Present Illness (HPI) The following HPI elements were documented for the patient's  wound: Location: left lower extremity and left thigh Quality: Patient reports experiencing a sharp pain to affected area(s). Severity: Patient states wound are getting worse. Duration: Patient has had the wound for > 3 months prior to seeking treatment at the wound center Timing: Pain in wound is Intermittent (comes and goes Context: The wound occurred when the patient had a fall in the skilled nursing facility and injured her left lateral lower extremity. Modifying Factors: Consults to this date include: orthopedics where she had a cast applied initially and also has gone to the ER a few times. Associated Signs and Symptoms: Patient reports having difficulty standing and is in bed for long periods. The patient was taken to the ED on 07/30/14 by her daughter for a worsening wound on her left lower lateral leg which has been there for a while. Xray of  the left lower extremity -IMPRESSION:Healing oblique distal tibial fracture. Soft tissue injury along the lateral aspect of the mid/distal fibula. No radiographic findings to suggest acute osteomyelitis. The patient was put on clindamycin and given some wound care. Her last hemoglobin A1c was 8.8. past medical history significant for -- diabetes mellitus type 2, dyspnea, hypertension, chronic constipation, CHF, history of ankle fracture, vaginal hysterectomy, abdomen and pain on and off, status post flexible sigmoidoscopy. Objective Constitutional [name], [name] ([id number]) Pulse regular. Respirations normal and unlabored. Afebrile. Vitals Time Taken: 10:22 AM, Height: 61 in, Weight: 224 lbs, BMI: 42.3, Temperature: 97.5 F, Pulse: 66 bpm, Respiratory Rate: 18 breaths/min, Blood Pressure: 120/68 mmHg. Eyes Nonicteric. Reactive to light. Ears, Nose, Mouth, and Throat Lips, teeth, and gums WNL.Marland Kitchen Moist mucosa without lesions . Neck supple and nontender. No palpable supraclavicular or cervical adenopathy. Normal sized without goiter. Respiratory WNL. No retractions.. Cardiovascular Pedal Pulses WNL. No clubbing, cyanosis or edema. Chest Breasts symmetical and no nipple discharge.. Breast tissue WNL, no masses, lumps, or tenderness.. Musculoskeletal Adexa without tenderness or enlargement.. Digits and nails w/o clubbing, cyanosis, infection, petechiae, ischemia, or inflammatory conditions.Marland Kitchen Psychiatric Judgement and insight Intact.. No evidence of depression, anxiety, or agitation.. General Notes: The patient's left lateral lower extremity wound is very clean with no evidence of any discharge. She still has undermining and the exposed tendon and will continue using silver alginate in this area. The one on the thigh looks about the same. Integumentary (Hair, Skin) No suspicious lesions. No crepitus or fluctuance. No peri-wound warmth or erythema. No masses.. Wound #1 status is Open.  Original cause of wound was Trauma. The wound is located on the Left,Lateral Lower Leg. The wound measures 2cm length x 1cm width x 1cm depth; 1.571cm^2 area and 1.571cm^3 volume. There is tendon exposed. There is undermining starting at 12:00 and ending at 7:00 with a maximum distance of 0.9cm. There is a medium amount of serous drainage noted. The wound margin is flat and intact. There is large (67-100%) red granulation within the wound bed. There is no necrotic tissue within the wound bed. The periwound skin appearance did not exhibit: Callus, Crepitus, Excoriation, Fluctuance, Friable, Induration, Localized Edema, Rash, Scarring, Dry/Scaly, Maceration, Moist, Atrophie Blanche, Cyanosis, Ecchymosis, Hemosiderin Staining, Mottled, Pallor, Rubor, Erythema. Periwound temperature was noted as No Abnormality. The periwound has tenderness on palpation. Wound #2 status is Open. Original cause of wound was Trauma. The wound is located on the Lateral,Posterior Upper Leg. The wound measures 0.4cm length x 0.7cm width x 0.1cm depth; 0.22cm^2 [name], [name] ([id number]) area and 0.022cm^3 volume. Assessment Active Problems ICD-10 E11.622 - Type 2 diabetes mellitus  with other skin ulcer S81.812A - Laceration without foreign body, left lower leg, initial encounter L97.122 - Non-pressure chronic ulcer of left thigh with fat layer exposed E66.01 - Morbid (severe) obesity due to excess calories We will continue packing with silver alginate rope and applying appropriate offloading to her thigh and her lateral ankle. She is awaiting vascular studies which are going to be done on August 5. I have emphasized the daughter that we need to offload as much as possible and she will communicate this also to the nursing staff at the assisted living. Plan Wound Cleansing: Wound #1 Left,Lateral Lower Leg: Clean wound with Normal Saline. Wound #2 Lateral,Posterior Upper Leg: Clean wound with Normal  Saline. Anesthetic: Wound #1 Left,Lateral Lower Leg: Topical Lidocaine 4% cream applied to wound bed prior to debridement Wound #2 Lateral,Posterior Upper Leg: Topical Lidocaine 4% cream applied to wound bed prior to debridement Skin Barriers/Peri-Wound Care: Wound #1 Left,Lateral Lower Leg: Skin Prep Wound #2 Lateral,Posterior Upper Leg: Skin Prep Primary Wound Dressing: Wound #1 Left,Lateral Lower Leg: Aquacel Ag Wound #2 Lateral,Posterior Upper Leg: Aquacel Ag Secondary Dressing: [name], [name] ([id number]) Wound #1 Left,Lateral Lower Leg: Boardered Foam Dressing Wound #2 Lateral,Posterior Upper Leg: Boardered Foam Dressing Dressing Change Frequency: Wound #1 Left,Lateral Lower Leg: Change dressing every other day. Wound #2 Lateral,Posterior Upper Leg: Change dressing every other day. Follow-up Appointments: Wound #1 Left,Lateral Lower Leg: Return Appointment in 1 week. Wound #2 Lateral,Posterior Upper Leg: Return Appointment in 1 week. Off-Loading: Wound #1 Left,Lateral Lower Leg: Turn and reposition every 2 hours Wound #2 Lateral,Posterior Upper Leg: Turn and reposition every 2 hours Additional Orders / Instructions: Wound #1 Left,Lateral Lower Leg: Increase protein intake. Wound #2 Lateral,Posterior Upper Leg: Increase protein intake. We will continue packing with silver alginate rope and applying appropriate offloading to her thigh and her lateral ankle. She is awaiting vascular studies which are going to be done on August 5. I have emphasized the daughter that we need to offload as much as possible and she will communicate this also to the nursing staff at the assisted living. Electronic Signature(s) Signed: 08/20/2014 11:03:34 AM By: Christin Fudge MD, FACS Entered By: Christin Fudge on 08/20/2014 11:03:34 [name], [name] ([id number]) -------------------------------------------------------------------------------- SuperBill Details Patient Name: [name], Samantha  A. Date of Service: 08/20/2014 Medical Record Number: [id number] Patient Account Number: [id number] Date of Birth/Sex: 17-Jun-1934 (80 y.o. 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 E66.01 Morbid (severe) obesity due to excess calories Facility Procedures CPT4 Code: [id number] Description: 99213 - WOUND CARE VISIT-LEV 3 EST PT Modifier: Quantity: 1 Physician Procedures CPT4 Code Description: [id number] - WC PHYS LEVEL 3 - EST PT ICD-10 Description Diagnosis E11.622 Type 2 diabetes mellitus with other skin ulcer S81.812A Laceration without foreign body, left lower leg, ini L97.122 Non-pressure chronic ulcer of left  thigh with fat la Modifier: tial encounter yer exposed Quantity: 1 Electronic Signature(s) Signed: 08/20/2014 1:36:34 PM By: Montey Hora Signed: 08/20/2014 4:00:02 PM By: Christin Fudge MD, FACS Previous Signature: 08/20/2014 11:03:54 AM Version By: Christin Fudge MD, FACS Entered By: Montey Hora on 08/20/2014 13:36:33

## 2014-08-21 NOTE — Progress Notes (Signed)
TATEBayla, Mcgovern ([id number]) Visit Report for 08/20/2014 Arrival Information Details Patient Name: [name], [name] Date of Service: 08/20/2014 10:15 AM Medical Record Number: [id number] Patient Account Number: [id number] Date of Birth/Sex: [birth year] (80 y.o. Female) Treating RN: Cornell Barman Primary Care Physician: Kelton Pillar Other Clinician: Referring Physician: Kelton Pillar Treating Physician/Extender: Frann Rider in Treatment: 1 Visit Information History Since Last Visit Added or deleted any medications: No Patient Arrived: Stretcher Any new allergies or adverse No Arrival Time: 10:21 reactions: Accompanied By: daughter Had a fall or experienced change in No Transfer Assistance: Stretcher activities of daily living that may Patient Identification Verified: Yes affect Secondary Verification Process Yes risk of falls: Completed: Signs or symptoms of abuse/neglect No Patient Has Alerts: Yes since last visito Patient Alerts: DMII Hospitalized since last visit: No Has Dressing in Place as Yes Prescribed: Pain Present Now: Unable to Respond Electronic Signature(s) Signed: 08/20/2014 2:36:54 PM By: Gretta Cool, RN, BSN, Kim RN, BSN Entered By: Gretta Cool, RN, BSN, Kim on 08/20/2014 10:22:06 Brosky, [name] ([id number]) -------------------------------------------------------------------------------- Clinic Level of Care Assessment Details Patient Name: [name], [name] Date of Service: 08/20/2014 10:15 AM Medical Record Number: [id number] Patient Account Number: [id number] Date of Birth/Sex: 02-24-1934 (80 y.o. Female) Treating RN: Montey Hora Primary Care Physician: Kelton Pillar Other Clinician: Referring Physician: Kelton Pillar Treating Physician/Extender: Frann Rider in Treatment: 1 Clinic Level of Care Assessment Items TOOL 4 Quantity Score []  - Use when only an EandM is performed on FOLLOW-UP visit 0 ASSESSMENTS - Nursing Assessment / Reassessment X  - Reassessment of Co-morbidities (includes updates in patient status) 1 10 X - Reassessment of Adherence to Treatment Plan 1 5 ASSESSMENTS - Wound and Skin Assessment / Reassessment []  - Simple Wound Assessment / Reassessment - one wound 0 X - Complex Wound Assessment / Reassessment - multiple wounds 2 5 []  - Dermatologic / Skin Assessment (not related to wound area) 0 ASSESSMENTS - Focused Assessment X - Circumferential Edema Measurements - multi extremities 1 5 []  - Nutritional Assessment / Counseling / Intervention 0 X - Lower Extremity Assessment (monofilament, tuning fork, pulses) 1 5 []  - Peripheral Arterial Disease Assessment (using hand held doppler) 0 ASSESSMENTS - Ostomy and/or Continence Assessment and Care []  - Incontinence Assessment and Management 0 []  - Ostomy Care Assessment and Management (repouching, etc.) 0 PROCESS - Coordination of Care X - Simple Patient / Family Education for ongoing care 1 15 []  - Complex (extensive) Patient / Family Education for ongoing care 0 []  - Staff obtains Programmer, systems, Records, Test Results / Process Orders 0 []  - Staff telephones HHA, Nursing Homes / Clarify orders / etc 0 []  - Routine Transfer to another Facility (non-emergent condition) 0 Glidden, [name] ([id number]) []  - Routine Hospital Admission (non-emergent condition) 0 []  - New Admissions / Biomedical engineer / Ordering NPWT, Apligraf, etc. 0 []  - Emergency Hospital Admission (emergent condition) 0 X - Simple Discharge Coordination 1 10 []  - Complex (extensive) Discharge Coordination 0 PROCESS - Special Needs []  - Pediatric / Minor Patient Management 0 []  - Isolation Patient Management 0 []  - Hearing / Language / Visual special needs 0 []  - Assessment of Community assistance (transportation, D/C planning, etc.) 0 []  - Additional assistance / Altered mentation 0 []  - Support Surface(s) Assessment (bed, cushion, seat, etc.) 0 INTERVENTIONS - Wound Cleansing / Measurement []  -  Simple Wound Cleansing - one wound 0 X - Complex Wound Cleansing - multiple wounds 2 5 X - Wound Imaging (photographs - any  number of wounds) 1 5 []  - Wound Tracing (instead of photographs) 0 []  - Simple Wound Measurement - one wound 0 X - Complex Wound Measurement - multiple wounds 2 5 INTERVENTIONS - Wound Dressings X - Small Wound Dressing one or multiple wounds 2 10 []  - Medium Wound Dressing one or multiple wounds 0 []  - Large Wound Dressing one or multiple wounds 0 []  - Application of Medications - topical 0 []  - Application of Medications - injection 0 INTERVENTIONS - Miscellaneous []  - External ear exam 0 [name], [name] ([id number]) []  - Specimen Collection (cultures, biopsies, blood, body fluids, etc.) 0 []  - Specimen(s) / Culture(s) sent or taken to Lab for analysis 0 []  - Patient Transfer (multiple staff / Harrel Lemon Lift / Similar devices) 0 []  - Simple Staple / Suture removal (25 or less) 0 []  - Complex Staple / Suture removal (26 or more) 0 []  - Hypo / Hyperglycemic Management (close monitor of Blood Glucose) 0 []  - Ankle / Brachial Index (ABI) - do not check if billed separately 0 X - Vital Signs 1 5 Has the patient been seen at the hospital within the last three years: Yes Total Score: 110 Level Of Care: New/Established - Level 3 Electronic Signature(s) Signed: 08/20/2014 1:36:20 PM By: Montey Hora Entered By: Montey Hora on 08/20/2014 13:36:20 [name], [name] ([id number]) -------------------------------------------------------------------------------- Encounter Discharge Information Details Patient Name: [name], [name] Date of Service: 08/20/2014 10:15 AM Medical Record Number: [id number] Patient Account Number: [id number] Date of Birth/Sex: 05-18-1934 (80 y.o. Female) Treating RN: Montey Hora Primary Care Physician: Kelton Pillar Other Clinician: Referring Physician: Kelton Pillar Treating Physician/Extender: Frann Rider in Treatment:  1 Encounter Discharge Information Items Discharge Pain Level: 0 Discharge Condition: Stable Ambulatory Status: Stretcher Nursing Discharge Destination: Home Transportation: Ambulance Accompanied By: dtr and ems Schedule Follow-up Appointment: Yes Medication Reconciliation completed No and provided to Patient/Care Shaylinn Hladik: Clinical Summary of Care: Electronic Signature(s) Signed: 08/20/2014 5:27:11 PM By: Montey Hora Entered By: Montey Hora on 08/20/2014 11:00:43 Dorrough, [name] ([id number]) -------------------------------------------------------------------------------- Lower Extremity Assessment Details Patient Name: [name], [name] Date of Service: 08/20/2014 10:15 AM Medical Record Number: [id number] Patient Account Number: [id number] Date of Birth/Sex: [birth year] (80 y.o. Female) Treating RN: Cornell Barman Primary Care Physician: Kelton Pillar Other Clinician: Referring Physician: Kelton Pillar Treating Physician/Extender: Frann Rider in Treatment: 1 Edema Assessment Assessed: [Left: No] [Right: No] E[Left: dema] [Right: :] Calf Left: Right: Point of Measurement: 37 cm From Medial Instep 40.7 cm cm Ankle Left: Right: Point of Measurement: 10 cm From Medial Instep 20.5 cm cm Vascular Assessment Pulses: Posterior Tibial Dorsalis Pedis Palpable: [Left:No] Doppler: [Left:Monophasic] Extremity colors, hair growth, and conditions: Extremity Color: [Left:Dusky] Hair Growth on Extremity: [Left:No] Temperature of Extremity: [Left:Warm] Capillary Refill: [Left:< 3 seconds] Toe Nail Assessment Left: Right: Thick: No Discolored: No Deformed: No Improper Length and Hygiene: No Electronic Signature(s) Signed: 08/20/2014 2:36:54 PM By: Gretta Cool, RN, BSN, Kim RN, BSN Entered By: Gretta Cool, RN, BSN, Kim on 08/20/2014 10:37:01 Lacount, Samantha Skeeters A. ([id number]) Coufal, Alesha AMarland Kitchen  ([id number]) -------------------------------------------------------------------------------- Multi Wound Chart Details Patient Name: [name], [name] Date of Service: 08/20/2014 10:15 AM Medical Record Number: [id number] Patient Account Number: [id number] Date of Birth/Sex: [birth year] (80 y.o. Female) Treating RN: Montey Hora Primary Care Physician: Kelton Pillar Other Clinician: Referring Physician: Kelton Pillar Treating Physician/Extender: Frann Rider in Treatment: 1 Vital Signs Height(in): 61 Pulse(bpm): 66 Weight(lbs): 224 Blood Pressure 120/68 (mmHg): Body Mass Index(BMI): 42 Temperature(F): 97.5 Respiratory Rate 18 (breaths/min): Photos: [  1:No Photos] [2:No Photos] [N/A:N/A] Wound Location: [1:Left Lower Leg - Lateral Lateral, Posterior Upper] [2:Leg] [N/A:N/A] Wounding Event: [1:Trauma] [2:Trauma] [N/A:N/A] Primary Etiology: [1:Trauma, Other] [2:Trauma, Other] [N/A:N/A] Comorbid History: [1:Anemia, Congestive Heart N/A Failure, Type II Diabetes, Osteoarthritis] [N/A:N/A] Date Acquired: [1:04/26/2014] [2:06/11/2014] [N/A:N/A] Weeks of Treatment: [1:1] [2:1] [N/A:N/A] Wound Status: [1:Open] [2:Open] [N/A:N/A] Measurements L x W x D 2x1x1 [2:0.4x0.7x0.1] [N/A:N/A] (cm) Area (cm) : [1:1.571] [2:0.22] [N/A:N/A] Volume (cm) : [1:1.571] [2:0.022] [N/A:N/A] % Reduction in Area: [1:0.00%] [2:22.30%] [N/A:N/A] % Reduction in Volume: 0.00% [2:21.40%] [N/A:N/A] Starting Position 1 12 (o'clock): Ending Position 1 [1:7] (o'clock): Maximum Distance 1 0.9 (cm): Undermining: [1:Yes] [2:N/A] [N/A:N/A] Classification: [1:Full Thickness With Exposed Support Structures] [2:Partial Thickness] [N/A:N/A] HBO Classification: [1:Grade 2] [2:N/A] [N/A:N/A] Exudate Amount: [1:Medium] [2:N/A] [N/A:N/A] Exudate Type: [1:Serous] [2:N/A] [N/A:N/A] Exudate Color: amber N/A N/A Wound Margin: Flat and Intact N/A N/A Granulation Amount: Large (67-100%) N/A N/A Granulation  Quality: Red N/A N/A Necrotic Amount: None Present (0%) N/A N/A Exposed Structures: Tendon: Yes N/A N/A Fascia: No Fat: No Muscle: No Joint: No Bone: No Epithelialization: None N/A N/A Periwound Skin Texture: Edema: No No Abnormalities Noted N/A Excoriation: No Induration: No Callus: No Crepitus: No Fluctuance: No Friable: No Rash: No Scarring: No Periwound Skin Maceration: No No Abnormalities Noted N/A Moisture: Moist: No Dry/Scaly: No Periwound Skin Color: Atrophie Blanche: No No Abnormalities Noted N/A Cyanosis: No Ecchymosis: No Erythema: No Hemosiderin Staining: No Mottled: No Pallor: No Rubor: No Temperature: No Abnormality N/A N/A Tenderness on Yes No N/A Palpation: Wound Preparation: Ulcer Cleansing: N/A N/A Rinsed/Irrigated with Saline Topical Anesthetic Applied: Other: lidocaine 4% Treatment Notes Electronic Signature(s) Signed: 08/20/2014 5:27:11 PM By: Montey Hora Entered By: Montey Hora on 08/20/2014 10:47:47 Episcopo, [name] ([id number]) Vandam, [name] ([id number]) -------------------------------------------------------------------------------- [place] Details Patient Name: Latendresse, [name] Date of Service: 08/20/2014 10:15 AM Medical Record Number: [id number] Patient Account Number: [id number] Date of Birth/Sex: Apr 11, 1934 (80 y.o. Female) Treating RN: Montey Hora Primary Care Physician: Kelton Pillar Other Clinician: Referring Physician: Kelton Pillar Treating Physician/Extender: Frann Rider in Treatment: 1 Active Inactive Abuse / Safety / Falls / Self Care Management Nursing Diagnoses: Potential for falls Goals: Patient will remain injury free Date Initiated: 08/13/2014 Goal Status: Active Interventions: Assess fall risk on admission and as needed Notes: Nutrition Nursing Diagnoses: Potential for alteratiion in Nutrition/Potential for imbalanced nutrition Goals: Patient/caregiver verbalizes  understanding of need to maintain therapeutic glucose control per primary care physician Date Initiated: 08/13/2014 Goal Status: Active Interventions: Assess patient nutrition upon admission and as needed per policy Notes: Orientation to the Wound Care Program Nursing Diagnoses: Knowledge deficit related to the wound healing center program Goals: Patient/caregiver will verbalize understanding of the [place], [place]. ([id number]) Date Initiated: 08/13/2014 Goal Status: Active Interventions: Provide education on orientation to the wound center Notes: Pain, Acute or Chronic Nursing Diagnoses: Pain, acute or chronic: actual or potential Goals: Patient will verbalize adequate pain control and receive pain control interventions during procedures as needed Date Initiated: 08/13/2014 Goal Status: Active Interventions: Provide education on pain management Notes: Wound/Skin Impairment Nursing Diagnoses: Impaired tissue integrity Goals: Ulcer/skin breakdown will have a volume reduction of 30% by week 4 Date Initiated: 08/13/2014 Goal Status: Active Interventions: Assess ulceration(s) every visit Notes: Electronic Signature(s) Signed: 08/20/2014 5:27:11 PM By: Montey Hora Entered By: Montey Hora on 08/20/2014 10:47:30 Oshiro, [name] ([id number]) -------------------------------------------------------------------------------- Pain Assessment Details Patient Name: [name], [name] Date of Service: 08/20/2014 10:15 AM Medical Record  Number: [id number] Patient Account Number: [id number] Date of Birth/Sex: 1934-08-28 (80 y.o. Female) Treating RN: Cornell Barman Primary Care Physician: Kelton Pillar Other Clinician: Referring Physician: Kelton Pillar Treating Physician/Extender: Frann Rider in Treatment: 1 Active Problems Location of Pain Severity and Description of Pain Patient Has Paino Patient Unable to Respond Site Locations Pain Management and  Medication Current Pain Management: Electronic Signature(s) Signed: 08/20/2014 2:36:54 PM By: Gretta Cool, RN, BSN, Kim RN, BSN Entered By: Gretta Cool, RN, BSN, Kim on 08/20/2014 10:22:13 Tapper, [name] ([id number]) -------------------------------------------------------------------------------- Patient/Caregiver Education Details Patient Name: [name], Samantha Skeeters A. Date of Service: 08/20/2014 10:15 AM Medical Record Number: [id number] Patient Account Number: [id number] Date of Birth/Gender: [birth year] (80 y.o. Female) Treating RN: Montey Hora Primary Care Physician: Kelton Pillar Other Clinician: Referring Physician: Kelton Pillar Treating Physician/Extender: Frann Rider in Treatment: 1 Education Assessment Education Provided To: Caregiver Education Topics Provided Wound/Skin Impairment: Handouts: Other: wound care to continue as ordered Methods: Demonstration, Explain/Verbal Responses: State content correctly Electronic Signature(s) Signed: 08/20/2014 5:27:11 PM By: Montey Hora Entered By: Montey Hora on 08/20/2014 11:01:11 [name], [name] ([id number]) -------------------------------------------------------------------------------- Wound Assessment Details Patient Name: [name], [name] Date of Service: 08/20/2014 10:15 AM Medical Record Number: [id number] Patient Account Number: [id number] Date of Birth/Sex: 08-25-1934 (80 y.o. Female) Treating RN: Cornell Barman Primary Care Physician: Kelton Pillar Other Clinician: Referring Physician: Kelton Pillar Treating Physician/Extender: Frann Rider in Treatment: 1 Wound Status Wound Number: 1 Primary Trauma, Other Etiology: Wound Location: Left Lower Leg - Lateral Wound Open Wounding Event: Trauma Status: Date Acquired: 04/26/2014 Comorbid Anemia, Congestive Heart Failure, Weeks Of Treatment: 1 History: Type II Diabetes, Osteoarthritis Clustered Wound: No Photos Photo Uploaded By: Gretta Cool, RN, BSN, Kim on 08/20/2014  13:00:48 Wound Measurements Length: (cm) 2 Width: (cm) 1 Depth: (cm) 1 Area: (cm) 1.571 Volume: (cm) 1.571 % Reduction in Area: 0% % Reduction in Volume: 0% Epithelialization: None Undermining: Yes Starting Position (o'clock): 12 Ending Position (o'clock): 7 Maximum Distance: (cm) 0.9 Wound Description Full Thickness With Exposed Classification: Support Structures Diabetic Severity Grade 2 (Wagner): Wound Margin: Flat and Intact Exudate Amount: Medium Exudate Type: Serous Exudate Color: amber Hodge, Nayali A. ([id number]) Foul Odor After Cleansing: No Wound Bed Granulation Amount: Large (67-100%) Exposed Structure Granulation Quality: Red Fascia Exposed: No Necrotic Amount: None Present (0%) Fat Layer Exposed: No Tendon Exposed: Yes Muscle Exposed: No Joint Exposed: No Bone Exposed: No Periwound Skin Texture Texture Color No Abnormalities Noted: No No Abnormalities Noted: No Callus: No Atrophie Blanche: No Crepitus: No Cyanosis: No Excoriation: No Ecchymosis: No Fluctuance: No Erythema: No Friable: No Hemosiderin Staining: No Induration: No Mottled: No Localized Edema: No Pallor: No Rash: No Rubor: No Scarring: No Temperature / Pain Moisture Temperature: No Abnormality No Abnormalities Noted: No Tenderness on Palpation: Yes Dry / Scaly: No Maceration: No Moist: No Wound Preparation Ulcer Cleansing: Rinsed/Irrigated with Saline Topical Anesthetic Applied: Other: lidocaine 4%, Treatment Notes Wound #1 (Left, Lateral Lower Leg) 1. Cleansed with: Clean wound with Normal Saline 2. Anesthetic Topical Lidocaine 4% cream to wound bed prior to debridement 4. Dressing Applied: Aquacel Ag 5. Secondary Dressing Applied Bordered Foam Dressing Electronic Signature(s) Signed: 08/20/2014 2:36:54 PM By: Gretta Cool, RN, BSN, Kim RN, BSN Entered By: Gretta Cool, RN, BSN, Kim on 08/20/2014 10:38:23 Doane, [name] ([id number]) Florek, [name]  ([id number]) -------------------------------------------------------------------------------- Wound Assessment Details Patient Name: [name], [name] Date of Service: 08/20/2014 10:15 AM Medical Record Number: [id number] Patient Account Number: [id number] Date of Birth/Sex: 1934-09-25 (80 y.o. Female) Treating RN: Gretta Cool,  [place] Primary Care Physician: Kelton Pillar Other Clinician: Referring Physician: Kelton Pillar Treating Physician/Extender: Frann Rider in Treatment: 1 Wound Status Wound Number: 2 Primary Etiology: Trauma, Other Wound Location: Lateral, Posterior Upper Leg Wound Status: Open Wounding Event: Trauma Date Acquired: 06/11/2014 Weeks Of Treatment: 1 Clustered Wound: No Photos Photo Uploaded By: Gretta Cool, RN, BSN, Kim on 08/20/2014 13:00:49 Wound Measurements Length: (cm) 0.4 Width: (cm) 0.7 Depth: (cm) 0.1 Area: (cm) 0.22 Volume: (cm) 0.022 % Reduction in Area: 22.3% % Reduction in Volume: 21.4% Wound Description Classification: Partial Thickness Periwound Skin Texture Texture Color No Abnormalities Noted: No No Abnormalities Noted: No Moisture No Abnormalities Noted: No Treatment Notes Wound #2 (Lateral, Posterior Upper Leg) 1. Cleansed with: Narayanan, Valorie A. ([id number]) Clean wound with Normal Saline 2. Anesthetic Topical Lidocaine 4% cream to wound bed prior to debridement 4. Dressing Applied: Aquacel Ag 5. Secondary Dressing Applied Bordered Foam Dressing Electronic Signature(s) Signed: 08/20/2014 2:36:54 PM By: Gretta Cool, RN, BSN, Kim RN, BSN Entered By: Gretta Cool, RN, BSN, Kim on 08/20/2014 10:37:35 Nydam, [name] ([id number]) -------------------------------------------------------------------------------- Vitals Details Patient Name: [name], [name] Date of Service: 08/20/2014 10:15 AM Medical Record Number: [id number] Patient Account Number: [id number] Date of Birth/Sex: 02-12-1934 (80 y.o. Female) Treating RN: Cornell Barman Primary Care  Physician: Kelton Pillar Other Clinician: Referring Physician: Kelton Pillar Treating Physician/Extender: Frann Rider in Treatment: 1 Vital Signs Time Taken: 10:22 Temperature (F): 97.5 Height (in): 61 Pulse (bpm): 66 Weight (lbs): 224 Respiratory Rate (breaths/min): 18 Body Mass Index (BMI): 42.3 Blood Pressure (mmHg): 120/68 Reference Range: 80 - 120 mg / dl Electronic Signature(s) Signed: 08/20/2014 2:36:54 PM By: Gretta Cool, RN, BSN, Kim RN, BSN Entered By: Gretta Cool, RN, BSN, Kim on 08/20/2014 10:25:30

## 2014-08-22 ENCOUNTER — Telehealth: Payer: Self-pay | Admitting: Cardiology

## 2014-08-22 NOTE — Telephone Encounter (Signed)
Spoke to Cletus Gash NP  She is managing care of the patient at [facility]  She has a question concerning when and how long patient has been using metolazone. Reviewing patient chart - medication was on medication list 06/2014 office visit with extender [place] The medication first appeared on 4 /2016visit. Cletus Gash np states she will have make a decision  Concerning medication due to patient labs RN informed Theadora Rama , Dr Percival Spanish has know problem with Theadora Rama handling issue since she sees patient more frequently.   request copy of note. Spoke to [place]- to obtain fax for [facility] [phone] ROUTED INFORMATION

## 2014-08-23 ENCOUNTER — Other Ambulatory Visit (HOSPITAL_COMMUNITY): Payer: Self-pay | Admitting: Psychology

## 2014-08-23 ENCOUNTER — Other Ambulatory Visit: Payer: Self-pay | Admitting: Internal Medicine

## 2014-08-23 DIAGNOSIS — M8580 Other specified disorders of bone density and structure, unspecified site: Secondary | ICD-10-CM

## 2014-08-27 ENCOUNTER — Ambulatory Visit: Payer: Medicare Other | Admitting: Surgery

## 2014-09-03 ENCOUNTER — Encounter: Payer: Medicare Other | Admitting: Surgery

## 2014-09-03 DIAGNOSIS — L97122 Non-pressure chronic ulcer of left thigh with fat layer exposed: Secondary | ICD-10-CM | POA: Diagnosis not present

## 2014-09-03 NOTE — Progress Notes (Addendum)
Samantha, [name] ([id number]) Visit Report for 09/03/2014 Chief Complaint Document Details Patient Name: [name], [name] Date of Service: 09/03/2014 1:00 PM Medical Record Number: [id number] Patient Account Number: [id number] Date of Birth/Sex: 1934-10-19 (80 y.o. Female) Treating RN: Primary Care Physician: Kelton Pillar Other Clinician: Referring Physician: Kelton Pillar Treating Physician/Extender: Frann Rider in Treatment: 3 Information Obtained from: Patient Chief Complaint Patient presents to the wound care center for a consult due non healing wound. ulcerated area on her left lower extremity and her left thigh for about 3 months. Electronic Signature(s) Signed: 09/03/2014 1:40:33 PM By: Christin Fudge MD, FACS Entered By: Christin Fudge on 09/03/2014 13:40:33 Azucena, [name] ([id number]) -------------------------------------------------------------------------------- HPI Details Patient Name: Samantha, [name] Date of Service: 09/03/2014 1:00 PM Medical Record Number: [id number] Patient Account Number: [id number] Date of Birth/Sex: 1934-12-07 (80 y.o. Female) Treating RN: Primary Care Physician: Kelton Pillar Other Clinician: Referring Physician: Kelton Pillar Treating Physician/Extender: Frann Rider in Treatment: 3 History of Present Illness Location: left lower extremity and left thigh Quality: Patient reports experiencing a sharp pain to affected area(s). Severity: Patient states wound are getting worse. Duration: Patient has had the wound for > 3 months prior to seeking treatment at the wound center Timing: Pain in wound is Intermittent (comes and goes Context: The wound occurred when the patient had a fall in the skilled nursing facility and injured her left lateral lower extremity. Modifying Factors: Consults to this date include: orthopedics where she had a cast applied initially and also has gone to the ER a few times. Associated Signs and Symptoms:  Patient reports having difficulty standing and is in bed for long periods. HPI Description: The patient was taken to the ED on 07/30/14 by her daughter for a worsening wound on her left lower lateral leg which has been there for a while. Xray of the left lower extremity -IMPRESSION:Healing oblique distal tibial fracture. Soft tissue injury along the lateral aspect of the mid/distal fibula. No radiographic findings to suggest acute osteomyelitis. The patient was put on clindamycin and given some wound care. Her last hemoglobin A1c was 8.8. opast medical history significant for -- diabetes mellitus type 2, dyspnea, hypertension, chronic constipation, CHF, history of ankle fracture, vaginal hysterectomy, abdomen and pain on and off, status post flexible sigmoidoscopy. Electronic Signature(s) Signed: 09/03/2014 1:40:41 PM By: Christin Fudge MD, FACS Entered By: Christin Fudge on 09/03/2014 13:40:40 Widrig, [name] ([id number]) -------------------------------------------------------------------------------- Physical Exam Details Patient Name: Samantha, [name] Date of Service: 09/03/2014 1:00 PM Medical Record Number: [id number] Patient Account Number: [id number] Date of Birth/Sex: Feb 21, 1934 (80 y.o. Female) Treating RN: Primary Care Physician: Kelton Pillar Other Clinician: Referring Physician: Kelton Pillar Treating Physician/Extender: Frann Rider in Treatment: 3 Constitutional . Pulse regular. Respirations normal and unlabored. Afebrile. . Eyes Nonicteric. Reactive to light. Ears, Nose, Mouth, and Throat Lips, teeth, and gums WNL.Marland Kitchen Moist mucosa without lesions . Neck supple and nontender. No palpable supraclavicular or cervical adenopathy. Normal sized without goiter. Respiratory WNL. No retractions.. Cardiovascular Pedal Pulses WNL. No clubbing, cyanosis or edema. Musculoskeletal Adexa without tenderness or enlargement.. Digits and nails w/o clubbing, cyanosis, infection,  petechiae, ischemia, or inflammatory conditions.. Integumentary (Hair, Skin) No suspicious lesions. No crepitus or fluctuance. No peri-wound warmth or erythema. No masses.Marland Kitchen Psychiatric Judgement and insight Intact.. No evidence of depression, anxiety, or agitation.. Notes The patient's left upper thigh posteriorly has clean granulation tissue and the left lower extremity lateral wound continues to show minimal exposed tendon but there is no slough  in the wound looks clean. Electronic Signature(s) Signed: 09/03/2014 1:41:41 PM By: Christin Fudge MD, FACS Entered By: Christin Fudge on 09/03/2014 13:41:41 Dazey, [name] ([id number]) -------------------------------------------------------------------------------- Physician Orders Details Patient Name: Samantha, [name] Date of Service: 09/03/2014 1:00 PM Medical Record Number: [id number] Patient Account Number: [id number] Date of Birth/Sex: July 09, 1934 (80 y.o. 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 Wound #2 Lateral,Posterior Upper Leg o Clean wound with Normal Saline. Anesthetic Wound #1 Left,Lateral Lower Leg o Topical Lidocaine 4% cream applied to wound bed prior to debridement Wound #2 Lateral,Posterior Upper Leg o Topical Lidocaine 4% cream applied to wound bed prior to debridement Skin Barriers/Peri-Wound  Care Wound #1 Left,Lateral Lower Leg o Skin Prep Wound #2 Lateral,Posterior Upper Leg o Skin Prep Primary Wound Dressing Wound #1 Left,Lateral Lower Leg o Aquacel Ag Wound #2 Lateral,Posterior Upper Leg o Prisma Ag Rude, Tunisha A. ([id number]) Secondary Dressing Wound #1 Left,Lateral Lower Leg o Boardered Foam Dressing Wound #2 Lateral,Posterior Upper Leg o Boardered Foam Dressing Dressing Change Frequency Wound #1 Left,Lateral Lower Leg o Change dressing every other day. Wound #2 Lateral,Posterior Upper Leg o Change dressing every other day. Follow-up Appointments Wound #1 Left,Lateral Lower Leg o Return Appointment in 2 weeks. Wound #2 Lateral,Posterior Upper Leg o Return Appointment in 2 weeks. Off-Loading Wound #1 Left,Lateral Lower Leg o Turn and reposition every 2 hours Wound #2 Lateral,Posterior Upper Leg o Turn and reposition every 2 hours Additional Orders / Instructions Wound #1 Left,Lateral Lower Leg o Increase protein intake. Wound #2 Lateral,Posterior Upper Leg o Increase protein intake. Electronic Signature(s) Signed: 09/03/2014 1:47:55 PM By: Montey Hora Signed: 09/03/2014 3:54:23 PM By: Christin Fudge MD, FACS Entered By: Montey Hora on 09/03/2014 13:47:55 Haueter, [name] ([id number]) -------------------------------------------------------------------------------- Problem List Details Patient Name: Febres, Troi A. Date of Service: 09/03/2014 1:00 PM Medical Record Number: [id number] Patient Account Number: [id number] Date of Birth/Sex: 02/10/1934 (80 y.o. Female) Treating RN: Primary Care Physician: Kelton Pillar Other Clinician: Referring Physician: Kelton Pillar Treating Physician/Extender: Frann Rider in Treatment: 3 Active Problems ICD-10 Encounter Code Description Active Date Diagnosis E11.622 Type 2 diabetes mellitus with other skin ulcer 08/13/2014 Yes S81.812A Laceration without foreign body, left  lower leg, initial 08/13/2014 Yes encounter D92.426 Non-pressure chronic ulcer of left thigh with fat layer 08/13/2014 Yes exposed E66.01 Morbid (severe) obesity due to excess calories 08/13/2014 Yes Inactive Problems Resolved Problems Electronic Signature(s) Signed: 09/03/2014 1:40:27 PM By: Christin Fudge MD, FACS Entered By: Christin Fudge on 09/03/2014 13:40:27 Kissner, [name] ([id number]) -------------------------------------------------------------------------------- Progress Note Details Patient Name: Lorensen, Evann A. Date of Service: 09/03/2014 1:00 PM Medical Record Number: [id number] Patient Account Number: [id number] Date of Birth/Sex: [birth year] (80 y.o. Female) Treating RN: Primary Care Physician: Kelton Pillar Other Clinician: Referring Physician: Kelton Pillar Treating Physician/Extender: Frann Rider in Treatment: 3 Subjective Chief Complaint Information obtained from Patient Patient presents to the wound care center for a consult due non healing wound. ulcerated area on her left lower extremity and her left thigh  for about 3 months. History of Present Illness (HPI) The following HPI elements were documented for the patient's wound: Location: left lower extremity and left thigh Quality: Patient reports experiencing a sharp pain to affected area(s). Severity: Patient states wound are getting worse. Duration: Patient has had the wound for > 3 months prior to seeking treatment at the wound center Timing: Pain in wound is Intermittent (comes and goes Context: The wound occurred when the patient had a fall in the skilled nursing facility and injured her left lateral lower extremity. Modifying Factors: Consults to this date include: orthopedics where she had a cast applied initially and also has gone to the ER a few times. Associated Signs and Symptoms: Patient reports having difficulty standing and is in bed for long periods. The patient was taken to the ED on 07/30/14  by her daughter for a worsening wound on her left lower lateral leg which has been there for a while. Xray of the left lower extremity -IMPRESSION:Healing oblique distal tibial fracture. Soft tissue injury along the lateral aspect of the mid/distal fibula. No radiographic findings to suggest acute osteomyelitis. The patient was put on clindamycin and given some wound care. Her last hemoglobin A1c was 8.8. past medical history significant for -- diabetes mellitus type 2, dyspnea, hypertension, chronic constipation, CHF, history of ankle fracture, vaginal hysterectomy, abdomen and pain on and off, status post flexible sigmoidoscopy. Allergies Levaquin, tetanus toxoid,fluid, latex, penicillin, Ativan Objective Hodsdon, Shaday A. ([id number]) Constitutional Pulse regular. Respirations normal and unlabored. Afebrile. Vitals Time Taken: 1:14 PM, Height: 61 in, Weight: 224 lbs, BMI: 42.3, Temperature: 99.0 F, Pulse: 71 bpm, Respiratory Rate: 18 breaths/min, Blood Pressure: 127/92 mmHg, Capillary Blood Glucose: 153 mg/dl. Eyes Nonicteric. Reactive to light. Ears, Nose, Mouth, and Throat Lips, teeth, and gums WNL.Marland Kitchen Moist mucosa without lesions . Neck supple and nontender. No palpable supraclavicular or cervical adenopathy. Normal sized without goiter. Respiratory WNL. No retractions.. Cardiovascular Pedal Pulses WNL. No clubbing, cyanosis or edema. Musculoskeletal Adexa without tenderness or enlargement.. Digits and nails w/o clubbing, cyanosis, infection, petechiae, ischemia, or inflammatory conditions.Marland Kitchen Psychiatric Judgement and insight Intact.. No evidence of depression, anxiety, or agitation.. General Notes: The patient's left upper thigh posteriorly has clean granulation tissue and the left lower extremity lateral wound continues to show minimal exposed tendon but there is no slough in the wound looks clean. Integumentary (Hair, Skin) No suspicious lesions. No crepitus or fluctuance.  No peri-wound warmth or erythema. No masses.. Wound #1 status is Open. Original cause of wound was Trauma. The wound is located on the Left,Lateral Lower Leg. The wound measures 2.3cm length x 1.3cm width x 0.9cm depth; 2.348cm^2 area and 2.114cm^3 volume. There is tendon exposed. There is no tunneling noted, however, there is undermining starting at 12:00 and ending at 6:00 with a maximum distance of 0.6cm. There is a medium amount of serous drainage noted. The wound margin is flat and intact. There is large (67-100%) red granulation within the wound bed. There is no necrotic tissue within the wound bed. The periwound skin appearance did not exhibit: Callus, Crepitus, Excoriation, Fluctuance, Friable, Induration, Localized Edema, Rash, Scarring, Dry/Scaly, Maceration, Moist, Atrophie Blanche, Cyanosis, Ecchymosis, Hemosiderin Staining, Mottled, Pallor, Rubor, Erythema. Periwound temperature was noted as No Abnormality. The periwound has tenderness on palpation. Wound #2 status is Open. Original cause of wound was Trauma. The wound is located on the Muro, Kallyn A. ([id number]) Lateral,Posterior Upper Leg. The wound measures 0.7cm length x 1.2cm width x 0.2cm depth; 0.66cm^2 area and  0.132cm^3 volume. The wound is limited to skin breakdown. There is no tunneling or undermining noted. There is a small amount of serous drainage noted. The wound margin is flat and intact. There is large (67-100%) red granulation within the wound bed. There is a small (1-33%) amount of necrotic tissue within the wound bed including Adherent Slough. The periwound skin appearance did not exhibit: Callus, Crepitus, Excoriation, Fluctuance, Friable, Induration, Localized Edema, Rash, Scarring, Dry/Scaly, Maceration, Moist, Atrophie Blanche, Cyanosis, Ecchymosis, Hemosiderin Staining, Mottled, Pallor, Rubor, Erythema. Periwound temperature was noted as No Abnormality. Assessment Active Problems ICD-10 E11.622 - Type 2  diabetes mellitus with other skin ulcer S81.812A - Laceration without foreign body, left lower leg, initial encounter L97.122 - Non-pressure chronic ulcer of left thigh with fat layer exposed E66.01 - Morbid (severe) obesity due to excess calories I have recommended Prisma AG to the upper thigh wound on the left side and to the left lower extremity beverages silver alginate rope. She has a vascular test next week and she will see as on a routine basis at regular intervals. The daughter was at the bedside has had all her questions answered.She is rather moribund and we will continue to see her regular basis. Plan I have recommended Prisma AG to the upper thigh wound on the left side and to the left lower extremity beverages silver alginate rope. She has a vascular test next week and she will see as on a routine basis at regular intervals. The daughter was at the bedside has had all her questions answered.She is rather moribund and we will continue to see her regular basis. Electronic Signature(s) AVENELL, SELLERS ([id number]) Signed: 09/03/2014 1:54:35 PM By: Christin Fudge MD, FACS Entered By: Christin Fudge on 09/03/2014 13:54:34 Laduca, [name] ([id number]) -------------------------------------------------------------------------------- SuperBill Details Patient Name: Carmical, Lucie A. Date of Service: 09/03/2014 Medical Record Number: [id number] Patient Account Number: [id number] Date of Birth/Sex: [birth year] (80 y.o. 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

## 2014-09-04 NOTE — Progress Notes (Signed)
TATEElesha, Thedford ([id number]) Visit Report for 09/03/2014 Allergy List Details Patient Name: [name], [name] Date of Service: 09/03/2014 1:00 PM Medical Record Number: [id number] Patient Account Number: [id number] Date of Birth/Sex: Jan 27, 1935 (80 y.o. Female) Treating RN: Montey Hora Primary Care Physician: Kelton Pillar Other Clinician: Referring Physician: Kelton Pillar Treating Physician/Extender: Frann Rider in Treatment: 3 Allergies Active Allergies Levaquin tetanus toxoid,fluid latex penicillin Ativan Allergy Notes Electronic Signature(s) Signed: 09/03/2014 4:46:32 PM By: Montey Hora Entered By: Montey Hora on 09/03/2014 13:17:32 [name] ([id number]) -------------------------------------------------------------------------------- Arrival Information Details Patient Name: [name], [name] Date of Service: 09/03/2014 1:00 PM Medical Record Number: [id number] Patient Account Number: [id number] Date of Birth/Sex: March 17, 1934 (80 y.o. Female) Treating RN: Montey Hora Primary Care Physician: Kelton Pillar Other Clinician: Referring Physician: Kelton Pillar Treating Physician/Extender: Frann Rider in Treatment: 3 Visit Information Patient Arrived: Stretcher Arrival Time: 13:13 Accompanied By: dtr and ems Transfer Assistance: Stretcher Patient Identification Verified: Yes Secondary Verification Process Yes Completed: Patient Has Alerts: Yes Patient Alerts: DMII History Since Last Visit Added or deleted any medications: No Any new allergies or adverse reactions: No Had a fall or experienced change in activities of daily living that may affect risk of falls: No Signs or symptoms of abuse/neglect since last visito No Hospitalized since last visit: No Electronic Signature(s) Signed: 09/03/2014 4:46:32 PM By: Montey Hora Entered By: Montey Hora on 09/03/2014 13:13:35 [name]  ([id number]) -------------------------------------------------------------------------------- Clinic Level of Care Assessment Details Patient Name: [name], [name] Date of Service: 09/03/2014 1:00 PM Medical Record Number: [id number] Patient Account Number: [id number] Date of Birth/Sex: 29-Apr-1934 (80 y.o. Female) Treating RN: Montey Hora Primary Care Physician: Kelton Pillar Other Clinician: Referring Physician: Kelton Pillar Treating Physician/Extender: Frann Rider in Treatment: 3 Clinic Level of Care Assessment Items TOOL 4 Quantity Score []  - Use when only an EandM is performed on FOLLOW-UP visit 0 ASSESSMENTS - Nursing Assessment / Reassessment X - Reassessment of Co-morbidities (includes updates in patient status) 1 10 X - Reassessment of Adherence to Treatment Plan 1 5 ASSESSMENTS - Wound and Skin Assessment / Reassessment []  - Simple Wound Assessment / Reassessment - one wound 0 X - Complex Wound Assessment / Reassessment - multiple wounds 2 5 []  - Dermatologic / Skin Assessment (not related to wound area) 0 ASSESSMENTS - Focused Assessment []  - Circumferential Edema Measurements - multi extremities 0 []  - Nutritional Assessment / Counseling / Intervention 0 X - Lower Extremity Assessment (monofilament, tuning fork, pulses) 1 5 []  - Peripheral Arterial Disease Assessment (using hand held doppler) 0 ASSESSMENTS - Ostomy and/or Continence Assessment and Care []  - Incontinence Assessment and Management 0 []  - Ostomy Care Assessment and Management (repouching, etc.) 0 PROCESS - Coordination of Care X - Simple Patient / Family Education for ongoing care 1 15 []  - Complex (extensive) Patient / Family Education for ongoing care 0 []  - Staff obtains Programmer, systems, Records, Test Results / Process Orders 0 []  - Staff telephones HHA, Nursing Homes / Clarify orders / etc 0 []  - Routine Transfer to another Facility (non-emergent condition) 0 [name], [name] ([id number]) []  -  Routine Hospital Admission (non-emergent condition) 0 []  - New Admissions / Biomedical engineer / Ordering NPWT, Apligraf, etc. 0 []  - Emergency Hospital Admission (emergent condition) 0 X - Simple Discharge Coordination 1 10 []  - Complex (extensive) Discharge Coordination 0 PROCESS - Special Needs []  - Pediatric / Minor Patient Management 0 []  - Isolation Patient Management 0 []  - Hearing / Language /  Visual special needs 0 []  - Assessment of Community assistance (transportation, D/C planning, etc.) 0 []  - Additional assistance / Altered mentation 0 []  - Support Surface(s) Assessment (bed, cushion, seat, etc.) 0 INTERVENTIONS - Wound Cleansing / Measurement []  - Simple Wound Cleansing - one wound 0 X - Complex Wound Cleansing - multiple wounds 2 5 X - Wound Imaging (photographs - any number of wounds) 1 5 []  - Wound Tracing (instead of photographs) 0 []  - Simple Wound Measurement - one wound 0 X - Complex Wound Measurement - multiple wounds 2 5 INTERVENTIONS - Wound Dressings X - Small Wound Dressing one or multiple wounds 2 10 []  - Medium Wound Dressing one or multiple wounds 0 []  - Large Wound Dressing one or multiple wounds 0 []  - Application of Medications - topical 0 []  - Application of Medications - injection 0 INTERVENTIONS - Miscellaneous []  - External ear exam 0 [name], [name] ([id number]) []  - Specimen Collection (cultures, biopsies, blood, body fluids, etc.) 0 []  - Specimen(s) / Culture(s) sent or taken to Lab for analysis 0 []  - Patient Transfer (multiple staff / Harrel Lemon Lift / Similar devices) 0 []  - Simple Staple / Suture removal (25 or less) 0 []  - Complex Staple / Suture removal (26 or more) 0 []  - Hypo / Hyperglycemic Management (close monitor of Blood Glucose) 0 []  - Ankle / Brachial Index (ABI) - do not check if billed separately 0 X - Vital Signs 1 5 Has the patient been seen at the hospital within the last three years: Yes Total Score: 105 Level Of Care:  New/Established - Level 3 Electronic Signature(s) Signed: 09/03/2014 1:55:19 PM By: Montey Hora Entered By: Montey Hora on 09/03/2014 13:55:19 [name] ([id number]) -------------------------------------------------------------------------------- Encounter Discharge Information Details Patient Name: Rozario, Savi A. Date of Service: 09/03/2014 1:00 PM Medical Record Number: [id number] Patient Account Number: [id number] Date of Birth/Sex: [birth year] (80 y.o. Female) Treating RN: Montey Hora Primary Care Physician: Kelton Pillar Other Clinician: Referring Physician: Kelton Pillar Treating Physician/Extender: Frann Rider in Treatment: 3 Encounter Discharge Information Items Discharge Pain Level: 0 Discharge Condition: Stable Ambulatory Status: Stretcher Nursing Discharge Destination: Home Private Transportation: Auto ems and Accompanied By: dtr Schedule Follow-up Appointment: Yes Medication Reconciliation completed and No provided to Patient/Care Stephana Morell: Clinical Summary of Care: Electronic Signature(s) Signed: 09/03/2014 2:02:54 PM By: Montey Hora Entered By: Montey Hora on 09/03/2014 14:02:54 Massi, [name] ([id number]) -------------------------------------------------------------------------------- Lower Extremity Assessment Details Patient Name: Mcduffie, Teyona A. Date of Service: 09/03/2014 1:00 PM Medical Record Number: [id number] Patient Account Number: [id number] Date of Birth/Sex: Nov 24, 1934 (80 y.o. Female) Treating RN: Montey Hora Primary Care Physician: Kelton Pillar Other Clinician: Referring Physician: Kelton Pillar Treating Physician/Extender: Frann Rider in Treatment: 3 Edema Assessment Assessed: [Left: No] [Right: No] E[Left: dema] [Right: :] Calf Left: Right: Point of Measurement: 37 cm From Medial Instep cm cm Ankle Left: Right: Point of Measurement: 10 cm From Medial Instep cm cm Vascular  Assessment Pulses: Posterior Tibial Dorsalis Pedis Palpable: [Left:No] Extremity colors, hair growth, and conditions: Extremity Color: [Left:Normal] Hair Growth on Extremity: [Left:No] Temperature of Extremity: [Left:Warm] Capillary Refill: [Left:< 3 seconds] Electronic Signature(s) Signed: 09/03/2014 4:46:32 PM By: Montey Hora Entered By: Montey Hora on 09/03/2014 13:23:21 [name], [name] ([id number]) -------------------------------------------------------------------------------- Multi Wound Chart Details Patient Name: [name], [name] Date of Service: 09/03/2014 1:00 PM Medical Record Number: [id number] Patient Account Number: [id number] Date of Birth/Sex: 03-Oct-1934 (80 y.o. Female) Treating RN: Montey Hora Primary Care Physician: Kelton Pillar Other  Clinician: Referring Physician: Kelton Pillar Treating Physician/Extender: Frann Rider in Treatment: 3 Vital Signs Height(in): 61 Capillary Blood 153 Glucose(mg/dl): Weight(lbs): 224 Pulse(bpm): 71 Body Mass Index(BMI): 42 Blood Pressure Temperature(F): 99.0 127/92 (mmHg): Respiratory Rate 18 (breaths/min): Photos: [1:No Photos] [2:No Photos] [N/A:N/A] Wound Location: [1:Left Lower Leg - Lateral Upper Leg - Lateral,] [2:Posterior] [N/A:N/A] Wounding Event: [1:Trauma] [2:Trauma] [N/A:N/A] Primary Etiology: [1:Trauma, Other] [2:Trauma, Other] [N/A:N/A] Comorbid History: [1:Anemia, Congestive Heart Anemia, Congestive Heart N/A Failure, Type II Diabetes, Failure, Type II Diabetes, Osteoarthritis] [2:Osteoarthritis] Date Acquired: [1:04/26/2014] [2:06/11/2014] [N/A:N/A] Weeks of Treatment: [1:3] [2:3] [N/A:N/A] Wound Status: [1:Open] [2:Open] [N/A:N/A] Measurements L x W x D 2.3x1.3x0.9 [2:0.7x1.2x0.2] [N/A:N/A] (cm) Area (cm) : [1:2.348] [2:0.66] [N/A:N/A] Volume (cm) : [1:2.114] [2:0.132] [N/A:N/A] % Reduction in Area: [1:-49.50%] [2:-133.20%] [N/A:N/A] % Reduction in Volume: -34.60% [2:-371.40%]  [N/A:N/A] Starting Position 1 12 (o'clock): Ending Position 1 [1:6] (o'clock): Maximum Distance 1 0.6 (cm): Undermining: [1:Yes] [2:No] [N/A:N/A] Classification: [1:Full Thickness With Exposed Support Structures] [2:Full Thickness Without Exposed Support Structures] [N/A:N/A] HBO Classification: [1:Grade 2] [2:Grade 1] [N/A:N/A] Exudate Amount: [1:Medium] [2:Small] [N/A:N/A] Exudate Type: [1:Serous] [2:Serous] [N/A:N/A] Exudate Color: amber amber N/A Wound Margin: Flat and Intact Flat and Intact N/A Granulation Amount: Large (67-100%) Large (67-100%) N/A Granulation Quality: Red Red N/A Necrotic Amount: None Present (0%) Small (1-33%) N/A Exposed Structures: Tendon: Yes Fascia: No N/A Fascia: No Fat: No Fat: No Tendon: No Muscle: No Muscle: No Joint: No Joint: No Bone: No Bone: No Limited to Skin Breakdown Epithelialization: None None N/A Periwound Skin Texture: Edema: No Edema: No N/A Excoriation: No Excoriation: No Induration: No Induration: No Callus: No Callus: No Crepitus: No Crepitus: No Fluctuance: No Fluctuance: No Friable: No Friable: No Rash: No Rash: No Scarring: No Scarring: No Periwound Skin Maceration: No Maceration: No N/A Moisture: Moist: No Moist: No Dry/Scaly: No Dry/Scaly: No Periwound Skin Color: Atrophie Blanche: No Atrophie Blanche: No N/A Cyanosis: No Cyanosis: No Ecchymosis: No Ecchymosis: No Erythema: No Erythema: No Hemosiderin Staining: No Hemosiderin Staining: No Mottled: No Mottled: No Pallor: No Pallor: No Rubor: No Rubor: No Temperature: No Abnormality No Abnormality N/A Tenderness on Yes No N/A Palpation: Wound Preparation: Ulcer Cleansing: Ulcer Cleansing: N/A Rinsed/Irrigated with Rinsed/Irrigated with Saline Saline Topical Anesthetic Topical Anesthetic Applied: Other: lidocaine Applied: None 4% Treatment Notes Electronic Signature(s) Signed: 09/03/2014 4:46:32 PM By: Dolly Rias  ([id number]) Entered By: Montey Hora on 09/03/2014 13:29:29 Fennell, [name] ([id number]) -------------------------------------------------------------------------------- [place] Details Patient Name: [name], [name] Date of Service: 09/03/2014 1:00 PM Medical Record Number: [id number] Patient Account Number: [id number] Date of Birth/Sex: 08-13-1934 (80 y.o. Female) Treating RN: Montey Hora Primary Care Physician: Kelton Pillar Other Clinician: Referring Physician: Kelton Pillar Treating Physician/Extender: Frann Rider in Treatment: 3 Active Inactive Abuse / Safety / Falls / Self Care Management Nursing Diagnoses: Potential for falls Goals: Patient will remain injury free Date Initiated: 08/13/2014 Goal Status: Active Interventions: Assess fall risk on admission and as needed Notes: Nutrition Nursing Diagnoses: Potential for alteratiion in Nutrition/Potential for imbalanced nutrition Goals: Patient/caregiver verbalizes understanding of need to maintain therapeutic glucose control per primary care physician Date Initiated: 08/13/2014 Goal Status: Active Interventions: Assess patient nutrition upon admission and as needed per policy Notes: Orientation to the Wound Care Program Nursing Diagnoses: Knowledge deficit related to the wound healing center program Goals: Patient/caregiver will verbalize understanding of the [place] ([id number]) Date Initiated: 08/13/2014 Goal Status: Active Interventions: Provide education on orientation to the  wound center Notes: Pain, Acute or Chronic Nursing Diagnoses: Pain, acute or chronic: actual or potential Goals: Patient will verbalize adequate pain control and receive pain control interventions during procedures as needed Date Initiated: 08/13/2014 Goal Status: Active Interventions: Provide education on pain management Notes: Wound/Skin Impairment Nursing  Diagnoses: Impaired tissue integrity Goals: Ulcer/skin breakdown will have a volume reduction of 30% by week 4 Date Initiated: 08/13/2014 Goal Status: Active Interventions: Assess ulceration(s) every visit Notes: Electronic Signature(s) Signed: 09/03/2014 4:46:32 PM By: Montey Hora Entered By: Montey Hora on 09/03/2014 13:29:20 Nola, [name] ([id number]) -------------------------------------------------------------------------------- Patient/Caregiver Education Details Patient Name: [name], Samantha Skeeters A. Date of Service: 09/03/2014 1:00 PM Medical Record Number: [id number] Patient Account Number: [id number] Date of Birth/Gender: Jul 02, 1934 (80 y.o. Female) Treating RN: Montey Hora Primary Care Physician: Kelton Pillar Other Clinician: Referring Physician: Kelton Pillar Treating Physician/Extender: Frann Rider in Treatment: 3 Education Assessment Education Provided To: Caregiver Education Topics Provided Offloading: Handouts: Other: offloading bottom area Methods: Explain/Verbal Responses: State content correctly Electronic Signature(s) Signed: 09/03/2014 1:35:46 PM By: Montey Hora Entered By: Montey Hora on 09/03/2014 13:35:46 Blue, Zoeann A. ([id number]) -------------------------------------------------------------------------------- Wound Assessment Details Patient Name: [name], [name] Date of Service: 09/03/2014 1:00 PM Medical Record Number: [id number] Patient Account Number: [id number] Date of Birth/Sex: 03/24/1934 (80 y.o. Female) Treating RN: Montey Hora Primary Care Physician: Kelton Pillar Other Clinician: Referring Physician: Kelton Pillar Treating Physician/Extender: Frann Rider in Treatment: 3 Wound Status Wound Number: 1 Primary Trauma, Other Etiology: Wound Location: Left Lower Leg - Lateral Wound Open Wounding Event: Trauma Status: Date Acquired: 04/26/2014 Comorbid Anemia, Congestive Heart Failure, Weeks Of  Treatment: 3 History: Type II Diabetes, Osteoarthritis Clustered Wound: No Photos Photo Uploaded By: Montey Hora on 09/03/2014 16:44:50 Wound Measurements Length: (cm) 2.3 Width: (cm) 1.3 Depth: (cm) 0.9 Area: (cm) 2.348 Volume: (cm) 2.114 % Reduction in Area: -49.5% % Reduction in Volume: -34.6% Epithelialization: None Tunneling: No Undermining: Yes Starting Position (o'clock): 12 Ending Position (o'clock): 6 Maximum Distance: (cm) 0.6 Wound Description Full Thickness With Exposed Classification: Support Structures Diabetic Severity Grade 2 (Wagner): Wound Margin: Flat and Intact Exudate Amount: Medium Exudate Type: Serous Loughner, Takara A. ([id number]) Foul Odor After Cleansing: No Exudate Color: amber Wound Bed Granulation Amount: Large (67-100%) Exposed Structure Granulation Quality: Red Fascia Exposed: No Necrotic Amount: None Present (0%) Fat Layer Exposed: No Tendon Exposed: Yes Muscle Exposed: No Joint Exposed: No Bone Exposed: No Periwound Skin Texture Texture Color No Abnormalities Noted: No No Abnormalities Noted: No Callus: No Atrophie Blanche: No Crepitus: No Cyanosis: No Excoriation: No Ecchymosis: No Fluctuance: No Erythema: No Friable: No Hemosiderin Staining: No Induration: No Mottled: No Localized Edema: No Pallor: No Rash: No Rubor: No Scarring: No Temperature / Pain Moisture Temperature: No Abnormality No Abnormalities Noted: No Tenderness on Palpation: Yes Dry / Scaly: No Maceration: No Moist: No Wound Preparation Ulcer Cleansing: Rinsed/Irrigated with Saline Topical Anesthetic Applied: Other: lidocaine 4%, Treatment Notes Wound #1 (Left, Lateral Lower Leg) 1. Cleansed with: Clean wound with Normal Saline 4. Dressing Applied: Aquacel Ag 5. Secondary Dressing Applied Bordered Foam Dressing Electronic Signature(s) Signed: 09/03/2014 4:46:32 PM By: Montey Hora Entered By: Montey Hora on 09/03/2014  13:24:10 [name], [name] ([id number]) Brentlinger, [place] ([id number]) -------------------------------------------------------------------------------- Wound Assessment Details Patient Name: [name], [name] Date of Service: 09/03/2014 1:00 PM Medical Record Number: [id number] Patient Account Number: [id number] Date of Birth/Sex: 10-Jul-1934 (80 y.o. Female) Treating RN: Montey Hora Primary Care Physician: Kelton Pillar Other Clinician: Referring Physician: Kelton Pillar Treating Physician/Extender:  Britto, Errol Weeks in Treatment: 3 Wound Status Wound Number: 2 Primary Trauma, Other Etiology: Wound Location: Upper Leg - Lateral, Posterior Wound Open Wounding Event: Trauma Status: Date Acquired: 06/11/2014 Comorbid Anemia, Congestive Heart Failure, Weeks Of Treatment: 3 History: Type II Diabetes, Osteoarthritis Clustered Wound: No Photos Photo Uploaded By: Montey Hora on 09/03/2014 16:45:08 Wound Measurements Length: (cm) 0.7 % Reduction Width: (cm) 1.2 % Reduction Depth: (cm) 0.2 Epithelializ Area: (cm) 0.66 Tunneling: Volume: (cm) 0.132 Undermining in Area: -133.2% in Volume: -371.4% ation: None No : No Wound Description Full Thickness Without Classification: Exposed Support Structures Diabetic Severity Grade 1 (Wagner): Wound Margin: Flat and Intact Exudate Amount: Small Exudate Type: Serous Exudate Color: amber Foul Odor After Cleansing: No Wound Bed Granulation Amount: Large (67-100%) Exposed Structure Granulation Quality: Red Fascia Exposed: No Reister, [name] ([id number]) Necrotic Amount: Small (1-33%) Fat Layer Exposed: No Necrotic Quality: Adherent Slough Tendon Exposed: No Muscle Exposed: No Joint Exposed: No Bone Exposed: No Limited to Skin Breakdown Periwound Skin Texture Texture Color No Abnormalities Noted: No No Abnormalities Noted: No Callus: No Atrophie Blanche: No Crepitus: No Cyanosis: No Excoriation: No Ecchymosis:  No Fluctuance: No Erythema: No Friable: No Hemosiderin Staining: No Induration: No Mottled: No Localized Edema: No Pallor: No Rash: No Rubor: No Scarring: No Temperature / Pain Moisture Temperature: No Abnormality No Abnormalities Noted: No Dry / Scaly: No Maceration: No Moist: No Wound Preparation Ulcer Cleansing: Rinsed/Irrigated with Saline Topical Anesthetic Applied: None Treatment Notes Wound #2 (Lateral, Posterior Upper Leg) 1. Cleansed with: Clean wound with Normal Saline 4. Dressing Applied: Prisma Ag 5. Secondary Dressing Applied Bordered Foam Dressing Electronic Signature(s) Signed: 09/03/2014 4:46:32 PM By: Montey Hora Entered By: Montey Hora on 09/03/2014 13:29:13 Soria, [name] ([id number]) -------------------------------------------------------------------------------- Vitals Details Patient Name: Flanery, Jaylanie A. Date of Service: 09/03/2014 1:00 PM Medical Record Number: [id number] Patient Account Number: [id number] Date of Birth/Sex: 28-Sep-1934 (80 y.o. Female) Treating RN: Montey Hora Primary Care Physician: Kelton Pillar Other Clinician: Referring Physician: Kelton Pillar Treating Physician/Extender: Frann Rider in Treatment: 3 Vital Signs Time Taken: 13:14 Temperature (F): 99.0 Height (in): 61 Pulse (bpm): 71 Weight (lbs): 224 Respiratory Rate (breaths/min): 18 Body Mass Index (BMI): 42.3 Blood Pressure (mmHg): 127/92 Capillary Blood Glucose (mg/dl): 153 Reference Range: 80 - 120 mg / dl Electronic Signature(s) Signed: 09/03/2014 4:46:32 PM By: Montey Hora Entered By: Montey Hora on 09/03/2014 13:14:10

## 2014-09-12 ENCOUNTER — Encounter: Payer: Self-pay | Admitting: Vascular Surgery

## 2014-09-13 ENCOUNTER — Ambulatory Visit (HOSPITAL_COMMUNITY)
Admission: RE | Admit: 2014-09-13 | Discharge: 2014-09-13 | Disposition: A | Discharge status: Home / Self Care | Payer: Medicare Other | Source: Ambulatory Visit | Attending: Vascular Surgery | Admitting: Vascular Surgery

## 2014-09-13 ENCOUNTER — Other Ambulatory Visit: Payer: Self-pay

## 2014-09-13 ENCOUNTER — Ambulatory Visit (INDEPENDENT_AMBULATORY_CARE_PROVIDER_SITE_OTHER): Payer: Medicare Other | Admitting: Vascular Surgery

## 2014-09-13 ENCOUNTER — Encounter: Payer: Self-pay | Admitting: Vascular Surgery

## 2014-09-13 VITALS — BP 124/62 | HR 83 | Temp 97.7°F | Resp 16 | Ht 62.0 in | Wt 218.0 lb

## 2014-09-13 DIAGNOSIS — L97909 Non-pressure chronic ulcer of unspecified part of unspecified lower leg with unspecified severity: Secondary | ICD-10-CM | POA: Diagnosis not present

## 2014-09-13 DIAGNOSIS — I70299 Other atherosclerosis of native arteries of extremities, unspecified extremity: Secondary | ICD-10-CM | POA: Insufficient documentation

## 2014-09-13 DIAGNOSIS — M79606 Pain in leg, unspecified: Secondary | ICD-10-CM

## 2014-09-13 NOTE — Progress Notes (Addendum)
Referred by:  Kelton Pillar, MD [address] [place], [place] [address]  Reason for referral: non-healing L lateral calf wound  History of Present Illness  [name] is a 80 y.o. (1934/07/11) female who presents with chief complaint: non-healing wound.  This patient was sedated from recent narcotic use and was minimally arousable.  Onset of symptom occurred last December when she fell.  She broke an ankle and became bed-bound.  Later in March 2016, she feel again and lacerated her left lateral calf.  This wound has slowly broken down despite wound care.  Reported the patient has pain in the wound, severe enough to prevent ABI being done.  The patient does not ambulated so she does not have intermittent claudication.  She also does not have rest pain.  Atherosclerotic risk factors include: HTN, DM, HLD.  Past Medical History  Diagnosis Date  . HTN (hypertension)   . Diverticulosis   . Hemorrhoid   . Osteoarthritis   . Constipation, chronic   . H/O: hysterectomy   . Systolic and diastolic CHF, chronic     a. Echo 07/2011: Technically limited; inferior HK, inferolateral and possibly lateral wall HK, EF 40%, moderate LVE, aortic sclerosis without stenosis, mild MR, mild LAE, question RV dysfunction;   b.  Echo (04/2013): EF 50-55%, normal wall motion, moderate MR, mild to moderate LAE, PASP 37  . Female bladder prolapse     with prolapse uterus, cystocele, s/p TVH/BSO 12/2006  . Obesity (BMI 30-39.9)     wt. 94.1 kg 12/2010  . Moderate mitral regurgitation     a. Mod to Sev by echo 01/2009;  b. mild MR by echo 2013  . Atrial fibrillation 10/13/2011    coumadin Rx  . Iron deficiency anemia   . Pneumonia   . Shortness of breath dyspnea   . GERD (gastroesophageal reflux disease)   . DM type 2 (diabetes mellitus, type 2) 07/25/2014  . Hyperlipidemia   . Ankle fracture   . Fracture, ankle Dec. 26, 2015    Left  . Fall at home March 2016    Past Surgical History    Procedure Laterality Date  . Wrist surgery    . Vaginal hysterectomy,cystocele and prolapse  repair  NOV. 2008  . Abdominal hysterectomy    . Colonoscopy N/A 07/18/2013    Procedure: COLONOSCOPY;  Surgeon: Wonda Horner, MD;  Location: [facility] ENDOSCOPY;  Service: Endoscopy;  Laterality: N/A;  . Flexible sigmoidoscopy N/A 03/08/2014    Procedure: FLEXIBLE SIGMOIDOSCOPY;  Surgeon: Lear Ng, MD;  Location: [facility] ENDOSCOPY;  Service: Endoscopy;  Laterality: N/A;  need hoyer lift    History   Social History  . Marital Status: Single    Spouse Name: N/A  . Number of Children: N/A  . Years of Education: N/A   Occupational History  . retired Gaffer    Social History Main Topics  . Smoking status: Never Smoker   . Smokeless tobacco: Former Systems developer    Types: Snuff  . Alcohol Use: No  . Drug Use: No  . Sexual Activity: Not Currently   Other Topics Concern  . Not on file   Social History Narrative   Lives in [place] by herself.  Her dtr lives near by and prepares all of her meals and leaves them for the pt to heat up in the microwave.  Dtr pays close attn to salt in meals.  Pt is sedentary.  Most activity is  moving from couch to bed.  She ambulates w/ a walker.  Wt is up from 217 in January to 244 now.    Family History  Problem Relation Age of Onset  . Diabetes Brother   . Hypertension Mother     deceased @ 41  . Leukemia Brother   . Cancer Brother   . Stroke Mother   . Diabetes Father     deceased in his 24's     Current Outpatient Prescriptions on File Prior to Visit  Medication Sig Dispense Refill  . albuterol (PROVENTIL) (2.5 MG/3ML) 0.083% nebulizer solution Take 2.5 mg by nebulization every 8 (eight) hours as needed for wheezing or shortness of breath (For wheezing and shortness of breath).     Marland Kitchen atorvastatin (LIPITOR) 40 MG tablet Take 40 mg by mouth daily.    . bisacodyl (DULCOLAX) 10 MG suppository Place 10 mg rectally daily as needed for moderate  constipation (if constipation not relieved by MOM).    . carvedilol (COREG) 3.125 MG tablet Take 3.125 mg by mouth 2 (two) times daily with a meal. Hold if SBP <100    . clindamycin (CLEOCIN) 300 MG capsule Take 1 capsule (300 mg total) by mouth 4 (four) times daily. X 7 days 28 capsule 0  . ferrous fumarate (FERRETTS) 325 (106 FE) MG TABS Take 1 tablet by mouth daily.     Marland Kitchen HYDROcodone-acetaminophen (NORCO/VICODIN) 5-325 MG per tablet Take one tablet by mouth every 6 hours as needed for pain (control) 120 tablet 0  . insulin aspart (NOVOLOG) 100 UNIT/ML injection Inject into the skin 4 (four) times daily -  before meals and at bedtime. Per sliding scale- no sliding scale instructions on MAR    . Insulin Glargine (TOUJEO SOLOSTAR) 300 UNIT/ML SOPN Inject 10 Units into the skin at bedtime.    Marland Kitchen lactulose (CHRONULAC) 10 GM/15ML solution Take 10 g by mouth daily.    Marland Kitchen lisinopril (PRINIVIL,ZESTRIL) 2.5 MG tablet Take 2.5 mg by mouth daily.    Marland Kitchen loratadine (CLARITIN) 10 MG tablet Take 10 mg by mouth daily.    Marland Kitchen lubiprostone (AMITIZA) 24 MCG capsule Take 1 capsule (24 mcg total) by mouth 2 (two) times daily with a meal. (Patient taking differently: Take 24 mcg by mouth 2 (two) times daily with a meal. For constipation) 30 capsule 0  . magnesium hydroxide (MILK OF MAGNESIA) 400 MG/5ML suspension Take 30 mLs by mouth daily as needed for mild constipation.    . metolazone (ZAROXOLYN) 2.5 MG tablet Take 2.5 mg by mouth once a week. Weekly on Wed    . pantoprazole (PROTONIX) 40 MG tablet Take 1 tablet (40 mg total) by mouth 2 (two) times daily. (Patient taking differently: Take 40 mg by mouth 2 (two) times daily. For GERD)    . polyethylene glycol (MIRALAX / GLYCOLAX) packet Take 17 g by mouth daily.    . potassium chloride SA (K-DUR,KLOR-CON) 20 MEQ tablet Take 40 mEq by mouth 2 (two) times daily.    . predniSONE (DELTASONE) 10 MG tablet Take 30 mg by mouth daily with breakfast.    . PRESCRIPTION MEDICATION  Take 120 mLs by mouth 2 (two) times daily. MedPass    . promethazine (PHENERGAN) 25 MG tablet Take 25 mg by mouth every 6 (six) hours as needed for nausea or vomiting.    . sennosides-docusate sodium (SENOKOT-S) 8.6-50 MG tablet Take 1 tablet by mouth daily.    . sodium chloride (OCEAN) 0.65 % SOLN nasal spray  Place 1 spray into both nostrils 2 (two) times daily.    Marland Kitchen torsemide (DEMADEX) 20 MG tablet Take 20 mg by mouth 2 (two) times daily.     . traMADol (ULTRAM) 50 MG tablet Take one tablet by mouth every 8 hours as needed for moderate pain 90 tablet 5  . Vitamin D, Ergocalciferol, (DRISDOL) 50000 UNITS CAPS capsule Take 50,000 Units by mouth every 7 (seven) days. On Tuesdays, for low vit D    . LORazepam (ATIVAN) 0.5 MG tablet Take one tablet by mouth every 6 hours as needed for anxiety (Patient not taking: Reported on 09/13/2014) 120 tablet 5   No current facility-administered medications on file prior to visit.    Allergies  Allergen Reactions  . Ativan [Lorazepam] Itching, Swelling and Rash    Throat, Tongue swelling  . Levaquin [Levofloxacin] Other (See Comments)    Per MAR  . Lexapro [Escitalopram Oxalate] Swelling  . Penicillins Other (See Comments)    Per MAR  . Tetanus-Diphtheria Toxoids Td Other (See Comments)    Per MAR  . Latex Hives and Rash    REVIEW OF SYSTEMS:  (Positives checked otherwise negative)  Not able to obtained due to sedation   For VQI Use Only  PRE-ADM LIVING: Nursing home  AMB STATUS: Bedridden  CAD Sx: None  PRIOR CHF: None  STRESS TEST: [x]  No, [ ]  Normal, [ ]  + ischemia, [ ]  + MI, [ ]  Both   Physical Examination  Filed Vitals:   09/13/14 1402  BP: 124/62  Pulse: 83  Temp: 97.7 F (36.5 C)  TempSrc: Oral  Resp: 16  Height: 5\' 2"  (1.575 m)  Weight: 218 lb (98.884 kg)  SpO2: 100%   Body mass index is 39.86 kg/(m^2).  General: A&O x 3, WD, morbidly obese  Head: /AT  Ear/Nose/Throat: Hearing cannot be tested, nares w/o  erythema or drainage, cannot exam oropharynx due to altered mental status   Eyes: PERRL, unable to test eye movement due to inability to cooperate  Neck: Supple, no nuchal rigidity, no palpable LAD, edematous neck  Pulmonary: Sym exp, suboptimal inspirations, CTAB, no rales, rhonchi, & wheezing, pursing lips with breathes  Cardiac: RRR, Nl S1, S2, no Murmurs, rubs or gallops  Vascular: Vessel Right Left  Radial Palpable Palpable  Brachial Palpable Palpable  Carotid Palpable, without bruit Palpable, without bruit  Aorta Not palpable N/A  Femoral Faintly Palpable, exam limited by pannus Palpable  Popliteal Not palpable Not palpable  PT Not Palpable Not Palpable  DP Palpable Faintly Palpable   Gastrointestinal: soft, NTND, -G/R, - HSM, - masses, - CVAT B  Musculoskeletal: unable to test Motor due to sedation, Extremities without ischemic changes except for clean ulcer on lateral calf down to muscle, ~2-3 cm in diameter  Neurologic: unable to test due to sedation  Psychiatric: unable to test due to sedation  Dermatologic: See M/S exam for extremity exam, no rashes otherwise noted  Lymph : No Cervical, Axillary, or Inguinal lymphadenopathy    Non-Invasive Vascular Imaging  BLE arterial duplex (Date: 09/13/2014)  R: triphasic pop, occluded PT, flow in peroneal and AT  L: mono pop, flow in all tibials   Medical Decision Making  [name] is a 80 y.o. female who presents with: LLE critical limb ischemia, bed-bound, likely B PAD, COPD vs obesity related hypoventiliation   I discussed with the patient's daughter the natural history of critical limb ischemia: 25% require amputation in one year, 50% are able to  maintain their limbs in one year, and 25-30% die in one year due to comorbidities.  Given the limb threatening status of this patient, I recommend an aggressive work up including proceeding with an: Aortogram, Bilateral runoff and intervention. I discussed with the  patient the nature of angiographic procedures, especially the limited patencies of any endovascular intervention. The patient is aware of that the risks of an angiographic procedure include but are not limited to: bleeding, infection, access site complications, embolization, rupture of treated vessel, dissection, possible need for emergent surgical intervention, and possible need for surgical procedures to treat the patient's pathology. The patient is aware of the risks and agrees to proceed.  The procedure is scheduled for: 17 AUG 16 in hybrid OR due to her poor respiratory status.  I discussed in depth with the patient the nature of atherosclerosis, and emphasized the importance of maximal medical management including strict control of blood pressure, blood glucose, and lipid levels, antiplatelet agents, obtaining regular exercise, and cessation of smoking.  The patient is aware that without maximal medical management the underlying atherosclerotic disease process will progress, limiting the benefit of any interventions. The patient is currently on a statin:  Lipitor. The patient is currently not on an anti-platelet: due to fall risks.  Thank you for allowing [name] to participate in this patient's care.  Adele Barthel, MD [facility] Office: [phone] Pager: [phone]  09/13/2014, 3:21 PM

## 2014-09-17 ENCOUNTER — Encounter: Payer: Medicare Other | Attending: Surgery | Admitting: Surgery

## 2014-09-17 DIAGNOSIS — L97122 Non-pressure chronic ulcer of left thigh with fat layer exposed: Secondary | ICD-10-CM | POA: Insufficient documentation

## 2014-09-17 DIAGNOSIS — I10 Essential (primary) hypertension: Secondary | ICD-10-CM | POA: Insufficient documentation

## 2014-09-17 DIAGNOSIS — I509 Heart failure, unspecified: Secondary | ICD-10-CM | POA: Insufficient documentation

## 2014-09-17 DIAGNOSIS — E11622 Type 2 diabetes mellitus with other skin ulcer: Secondary | ICD-10-CM | POA: Diagnosis not present

## 2014-09-18 NOTE — Progress Notes (Signed)
TATENicoya, Friel ([id number]) Visit Report for 09/17/2014 Arrival Information Details Patient Name: [name], [name] Date of Service: 09/17/2014 1:00 PM Medical Record Number: [id number] Patient Account Number: [id number] Date of Birth/Sex: 12/07/1934 (80 y.o. Female) Treating RN: Afful, RN, BSN, Velva Harman Primary Care Physician: Kelton Pillar Other Clinician: Referring Physician: Kelton Pillar Treating Physician/Extender: Frann Rider in Treatment: 5 Visit Information History Since Last Visit Had a fall or experienced change in No Patient Arrived: Stretcher activities of daily living that may affect Arrival Time: 13:25 risk of falls: Accompanied By: self Signs or symptoms of abuse/neglect since last No Transfer Assistance: Other visito Patient Identification Verified: Yes Pain Present Now: No Secondary Verification Process Yes Completed: Patient Has Alerts: Yes Patient Alerts: DMII Electronic Signature(s) Signed: 09/17/2014 3:59:33 PM By: Regan Lemming BSN, RN Entered By: Regan Lemming on 09/17/2014 13:25:24 [name] ([id number]) -------------------------------------------------------------------------------- Clinic Level of Care Assessment Details Patient Name: [name], [name] Date of Service: 09/17/2014 1:00 PM Medical Record Number: [id number] Patient Account Number: [id number] Date of Birth/Sex: October 18, 1934 (80 y.o. Female) Treating RN: Afful, RN, BSN, Velva Harman Primary Care Physician: Kelton Pillar Other Clinician: Referring Physician: Kelton Pillar Treating Physician/Extender: Frann Rider in Treatment: 5 Clinic Level of Care Assessment Items TOOL 4 Quantity Score []  - Use when only an EandM is performed on FOLLOW-UP visit 0 ASSESSMENTS - Nursing Assessment / Reassessment X - Reassessment of Co-morbidities (includes updates in patient status) 1 10 X - Reassessment of Adherence to Treatment Plan 1 5 ASSESSMENTS - Wound and Skin Assessment / Reassessment []  -  Simple Wound Assessment / Reassessment - one wound 0 X - Complex Wound Assessment / Reassessment - multiple wounds 2 5 []  - Dermatologic / Skin Assessment (not related to wound area) 0 ASSESSMENTS - Focused Assessment []  - Circumferential Edema Measurements - multi extremities 0 []  - Nutritional Assessment / Counseling / Intervention 0 X - Lower Extremity Assessment (monofilament, tuning fork, pulses) 1 5 []  - Peripheral Arterial Disease Assessment (using hand held doppler) 0 ASSESSMENTS - Ostomy and/or Continence Assessment and Care []  - Incontinence Assessment and Management 0 []  - Ostomy Care Assessment and Management (repouching, etc.) 0 PROCESS - Coordination of Care X - Simple Patient / Family Education for ongoing care 1 15 []  - Complex (extensive) Patient / Family Education for ongoing care 0 []  - Staff obtains Programmer, systems, Records, Test Results / Process Orders 0 []  - Staff telephones HHA, Nursing Homes / Clarify orders / etc 0 []  - Routine Transfer to another Facility (non-emergent condition) 0 [name], [name] ([id number]) []  - Routine Hospital Admission (non-emergent condition) 0 []  - New Admissions / Biomedical engineer / Ordering NPWT, Apligraf, etc. 0 []  - Emergency Hospital Admission (emergent condition) 0 []  - Simple Discharge Coordination 0 []  - Complex (extensive) Discharge Coordination 0 PROCESS - Special Needs []  - Pediatric / Minor Patient Management 0 []  - Isolation Patient Management 0 []  - Hearing / Language / Visual special needs 0 []  - Assessment of Community assistance (transportation, D/C planning, etc.) 0 []  - Additional assistance / Altered mentation 0 []  - Support Surface(s) Assessment (bed, cushion, seat, etc.) 0 INTERVENTIONS - Wound Cleansing / Measurement []  - Simple Wound Cleansing - one wound 0 X - Complex Wound Cleansing - multiple wounds 2 5 X - Wound Imaging (photographs - any number of wounds) 1 5 []  - Wound Tracing (instead of photographs)  0 []  - Simple Wound Measurement - one wound 0 X - Complex Wound Measurement - multiple  wounds 2 5 INTERVENTIONS - Wound Dressings X - Small Wound Dressing one or multiple wounds 2 10 []  - Medium Wound Dressing one or multiple wounds 0 []  - Large Wound Dressing one or multiple wounds 0 []  - Application of Medications - topical 0 []  - Application of Medications - injection 0 INTERVENTIONS - Miscellaneous []  - External ear exam 0 [name], [name] ([id number]) []  - Specimen Collection (cultures, biopsies, blood, body fluids, etc.) 0 []  - Specimen(s) / Culture(s) sent or taken to Lab for analysis 0 []  - Patient Transfer (multiple staff / Harrel Lemon Lift / Similar devices) 0 []  - Simple Staple / Suture removal (25 or less) 0 []  - Complex Staple / Suture removal (26 or more) 0 []  - Hypo / Hyperglycemic Management (close monitor of Blood Glucose) 0 []  - Ankle / Brachial Index (ABI) - do not check if billed separately 0 X - Vital Signs 1 5 Has the patient been seen at the hospital within the last three years: Yes Total Score: 95 Level Of Care: New/Established - Level 3 Electronic Signature(s) Signed: 09/17/2014 1:56:12 PM By: Regan Lemming BSN, RN Entered By: Regan Lemming on 09/17/2014 13:56:10 [name] ([id number]) -------------------------------------------------------------------------------- Encounter Discharge Information Details Patient Name: [name], [name] Date of Service: 09/17/2014 1:00 PM Medical Record Number: [id number] Patient Account Number: [id number] Date of Birth/Sex: July 25, 1934 (80 y.o. Female) Treating RN: Baruch Gouty, RN, BSN, Velva Harman Primary Care Physician: Kelton Pillar Other Clinician: Referring Physician: Kelton Pillar Treating Physician/Extender: Frann Rider in Treatment: 5 Encounter Discharge Information Items Discharge Pain Level: 0 Discharge Condition: Stable Ambulatory Status: Stretcher Nursing Discharge Destination: Home Transportation:  Ambulance Accompanied By: ems Schedule Follow-up Appointment: No Medication Reconciliation completed No and provided to Patient/Care Lucrecia Mcphearson: Clinical Summary of Care: Electronic Signature(s) Signed: 09/17/2014 1:57:14 PM By: Regan Lemming BSN, RN Entered By: Regan Lemming on 09/17/2014 13:57:14 Silverstein, [name] ([id number]) -------------------------------------------------------------------------------- Lower Extremity Assessment Details Patient Name: Lapenna, Nalina A. Date of Service: 09/17/2014 1:00 PM Medical Record Number: [id number] Patient Account Number: [id number] Date of Birth/Sex: March 24, 1934 (80 y.o. Female) Treating RN: Afful, RN, BSN, Velva Harman Primary Care Physician: Kelton Pillar Other Clinician: Referring Physician: Kelton Pillar Treating Physician/Extender: Frann Rider in Treatment: 5 Edema Assessment Assessed: Shirlyn Goltz: No] [Right: No] E[Left: dema] [Right: :] Calf Left: Right: Point of Measurement: 37 cm From Medial Instep cm cm Ankle Left: Right: Point of Measurement: 10 cm From Medial Instep cm cm Vascular Assessment Pulses: Posterior Tibial Dorsalis Pedis Palpable: [Left:Yes] Extremity colors, hair growth, and conditions: Extremity Color: [Left:Dusky] Hair Growth on Extremity: [Left:No] Temperature of Extremity: [Left:Warm] Toe Nail Assessment Left: Right: Thick: No Discolored: Yes Deformed: No Improper Length and Hygiene: Yes Electronic Signature(s) Signed: 09/17/2014 3:59:33 PM By: Regan Lemming BSN, RN Entered By: Regan Lemming on 09/17/2014 13:31:56 Lymon, [name] ([id number]) -------------------------------------------------------------------------------- Multi Wound Chart Details Patient Name: [name], [name] Date of Service: 09/17/2014 1:00 PM Medical Record Number: [id number] Patient Account Number: [id number] Date of Birth/Sex: [birth year] (80 y.o. Female) Treating RN: Baruch Gouty, RN, BSN, Velva Harman Primary Care Physician: Kelton Pillar Other  Clinician: Referring Physician: Kelton Pillar Treating Physician/Extender: Frann Rider in Treatment: 5 Vital Signs Height(in): 61 Pulse(bpm): 83 Weight(lbs): 224 Blood Pressure 114/74 (mmHg): Body Mass Index(BMI): 42 Temperature(F): 98.5 Respiratory Rate 18 (breaths/min): Photos: [1:No Photos] [2:No Photos] [N/A:N/A] Wound Location: [1:Left Lower Leg - Lateral Upper Leg - Lateral,] [2:Posterior] [N/A:N/A] Wounding Event: [1:Trauma] [2:Trauma] [N/A:N/A] Primary Etiology: [1:Trauma, Other] [2:Trauma, Other] [N/A:N/A] Comorbid History: [1:Anemia, Congestive Heart Anemia, Congestive Heart  N/A Failure, Type II Diabetes, Failure, Type II Diabetes, Osteoarthritis] [2:Osteoarthritis] Date Acquired: [1:04/26/2014] [2:06/11/2014] [N/A:N/A] Weeks of Treatment: [1:5] [2:5] [N/A:N/A] Wound Status: [1:Open] [2:Open] [N/A:N/A] Measurements L x W x D 2.2x1x0.9 [2:0.6x1.2x0.1] [N/A:N/A] (cm) Area (cm) : [1:1.728] [2:0.565] [N/A:N/A] Volume (cm) : [1:1.555] [2:0.057] [N/A:N/A] % Reduction in Area: [1:-10.00%] [2:-99.60%] [N/A:N/A] % Reduction in Volume: 1.00% [2:-103.60%] [N/A:N/A] Starting Position 1 12 (o'clock): Ending Position 1 [1:12] (o'clock): Maximum Distance 1 1 (cm): Undermining: [1:Yes] [2:No] [N/A:N/A] Classification: [1:Full Thickness With Exposed Support Structures] [2:Full Thickness Without Exposed Support Structures] [N/A:N/A] HBO Classification: [1:Grade 2] [2:Grade 1] [N/A:N/A] Exudate Amount: [1:Medium] [2:Small] [N/A:N/A] Exudate Type: [1:Serous] [2:Serous] [N/A:N/A] Exudate Color: amber amber N/A Wound Margin: Flat and Intact Flat and Intact N/A Granulation Amount: Large (67-100%) Large (67-100%) N/A Granulation Quality: Red Red N/A Necrotic Amount: None Present (0%) None Present (0%) N/A Exposed Structures: Tendon: Yes Fascia: No N/A Fascia: No Fat: No Fat: No Tendon: No Muscle: No Muscle: No Joint: No Joint: No Bone: No Bone: No Limited to  Skin Breakdown Epithelialization: None None N/A Periwound Skin Texture: Edema: No Edema: No N/A Excoriation: No Excoriation: No Induration: No Induration: No Callus: No Callus: No Crepitus: No Crepitus: No Fluctuance: No Fluctuance: No Friable: No Friable: No Rash: No Rash: No Scarring: No Scarring: No Periwound Skin Moist: Yes Moist: Yes N/A Moisture: Maceration: No Maceration: No Dry/Scaly: No Dry/Scaly: No Periwound Skin Color: Atrophie Blanche: No Atrophie Blanche: No N/A Cyanosis: No Cyanosis: No Ecchymosis: No Ecchymosis: No Erythema: No Erythema: No Hemosiderin Staining: No Hemosiderin Staining: No Mottled: No Mottled: No Pallor: No Pallor: No Rubor: No Rubor: No Temperature: No Abnormality No Abnormality N/A Tenderness on Yes No N/A Palpation: Wound Preparation: Ulcer Cleansing: Ulcer Cleansing: N/A Rinsed/Irrigated with Rinsed/Irrigated with Saline Saline Topical Anesthetic Topical Anesthetic Applied: Other: lidocaine Applied: None 4% Treatment Notes Electronic Signature(s) Signed: 09/17/2014 3:59:33 PM By: Regan Lemming BSN, RN [name], [name] ([id number]) Entered By: Regan Lemming on 09/17/2014 13:49:14 Baldree, [name] ([id number]) -------------------------------------------------------------------------------- Fisher Details Patient Name: Sobol, Marwa A. Date of Service: 09/17/2014 1:00 PM Medical Record Number: [id number] Patient Account Number: [id number] Date of Birth/Sex: February 10, 1934 (80 y.o. Female) Treating RN: Afful, RN, BSN, Velva Harman Primary Care Physician: Kelton Pillar Other Clinician: Referring Physician: Kelton Pillar Treating Physician/Extender: Frann Rider in Treatment: 5 Active Inactive Abuse / Safety / Falls / Self Care Management Nursing Diagnoses: Potential for falls Goals: Patient will remain injury free Date Initiated: 08/13/2014 Goal Status: Active Interventions: Assess fall risk on  admission and as needed Notes: Nutrition Nursing Diagnoses: Potential for alteratiion in Nutrition/Potential for imbalanced nutrition Goals: Patient/caregiver verbalizes understanding of need to maintain therapeutic glucose control per primary care physician Date Initiated: 08/13/2014 Goal Status: Active Interventions: Assess patient nutrition upon admission and as needed per policy Notes: Orientation to the Wound Care Program Nursing Diagnoses: Knowledge deficit related to the wound healing center program Goals: Patient/caregiver will verbalize understanding of the [place] ([id number]) Date Initiated: 08/13/2014 Goal Status: Active Interventions: Provide education on orientation to the wound center Notes: Pain, Acute or Chronic Nursing Diagnoses: Pain, acute or chronic: actual or potential Goals: Patient will verbalize adequate pain control and receive pain control interventions during procedures as needed Date Initiated: 08/13/2014 Goal Status: Active Interventions: Provide education on pain management Notes: Wound/Skin Impairment Nursing Diagnoses: Impaired tissue integrity Goals: Ulcer/skin breakdown will have a volume reduction of 30% by week 4 Date Initiated: 08/13/2014 Goal Status: Active Interventions: Assess  ulceration(s) every visit Notes: Electronic Signature(s) Signed: 09/17/2014 3:59:33 PM By: Regan Lemming BSN, RN Entered By: Regan Lemming on 09/17/2014 13:49:03 Ebert, [name] ([id number]) -------------------------------------------------------------------------------- Pain Assessment Details Patient Name: [name], [name] Date of Service: 09/17/2014 1:00 PM Medical Record Number: [id number] Patient Account Number: [id number] Date of Birth/Sex: 03-Mar-1934 (80 y.o. Female) Treating RN: Baruch Gouty, RN, BSN, Velva Harman Primary Care Physician: Kelton Pillar Other Clinician: Referring Physician: Kelton Pillar Treating Physician/Extender:  Frann Rider in Treatment: 5 Active Problems Location of Pain Severity and Description of Pain Patient Has Paino No Site Locations Pain Management and Medication Current Pain Management: Electronic Signature(s) Signed: 09/17/2014 3:59:33 PM By: Regan Lemming BSN, RN Entered By: Regan Lemming on 09/17/2014 13:27:04 [name], [name] ([id number]) -------------------------------------------------------------------------------- Patient/Caregiver Education Details Patient Name: [name], [name] Date of Service: 09/17/2014 1:00 PM Medical Record Number: [id number] Patient Account Number: [id number] Date of Birth/Gender: 14-May-1934 (80 y.o. Female) Treating RN: Baruch Gouty, RN, BSN, Velva Harman Primary Care Physician: Kelton Pillar Other Clinician: Referring Physician: Kelton Pillar Treating Physician/Extender: Frann Rider in Treatment: 5 Education Assessment Education Provided To: Patient Education Topics Provided Pain: Methods: Explain/Verbal Responses: Reinforcements needed Welcome To The [facility]: Methods: Explain/Verbal Responses: Reinforcements needed Electronic Signature(s) Signed: 09/17/2014 1:57:40 PM By: Regan Lemming BSN, RN Entered By: Regan Lemming on 09/17/2014 13:57:40 Bangs, [name] ([id number]) -------------------------------------------------------------------------------- Wound Assessment Details Patient Name: [name], [name] Date of Service: 09/17/2014 1:00 PM Medical Record Number: [id number] Patient Account Number: [id number] Date of Birth/Sex: July 11, 1934 (80 y.o. Female) Treating RN: Afful, RN, BSN, Velva Harman Primary Care Physician: Kelton Pillar Other Clinician: Referring Physician: Kelton Pillar Treating Physician/Extender: Frann Rider in Treatment: 5 Wound Status Wound Number: 1 Primary Trauma, Other Etiology: Wound Location: Left Lower Leg - Lateral Wound Open Wounding Event: Trauma Status: Date Acquired: 04/26/2014 Comorbid Anemia,  Congestive Heart Failure, Weeks Of Treatment: 5 History: Type II Diabetes, Osteoarthritis Clustered Wound: No Photos Photo Uploaded By: Regan Lemming on 09/17/2014 16:30:23 Wound Measurements Length: (cm) 2.2 Width: (cm) 1 Depth: (cm) 0.9 Area: (cm) 1.728 Volume: (cm) 1.555 % Reduction in Area: -10% % Reduction in Volume: 1% Epithelialization: None Tunneling: No Undermining: Yes Starting Position (o'clock): 12 Ending Position (o'clock): 12 Maximum Distance: (cm) 1 Wound Description Full Thickness With Exposed Classification: Support Structures Diabetic Severity Grade 2 (Wagner): Wound Margin: Flat and Intact Exudate Amount: Medium Exudate Type: Serous [name], [name] ([id number]) Foul Odor After Cleansing: No Exudate Color: amber Wound Bed Granulation Amount: Large (67-100%) Exposed Structure Granulation Quality: Red Fascia Exposed: No Necrotic Amount: None Present (0%) Fat Layer Exposed: No Tendon Exposed: Yes Muscle Exposed: No Joint Exposed: No Bone Exposed: No Periwound Skin Texture Texture Color No Abnormalities Noted: No No Abnormalities Noted: No Callus: No Atrophie Blanche: No Crepitus: No Cyanosis: No Excoriation: No Ecchymosis: No Fluctuance: No Erythema: No Friable: No Hemosiderin Staining: No Induration: No Mottled: No Localized Edema: No Pallor: No Rash: No Rubor: No Scarring: No Temperature / Pain Moisture Temperature: No Abnormality No Abnormalities Noted: No Tenderness on Palpation: Yes Dry / Scaly: No Maceration: No Moist: Yes Wound Preparation Ulcer Cleansing: Rinsed/Irrigated with Saline Topical Anesthetic Applied: Other: lidocaine 4%, Treatment Notes Wound #1 (Left, Lateral Lower Leg) 1. Cleansed with: Clean wound with Normal Saline 3. Peri-wound Care: Skin Prep 4. Dressing Applied: Aquacel Ag 5. Secondary Dressing Applied Bordered Foam Dressing Electronic Signature(s) Signed: 09/17/2014 3:59:33 PM By: Regan Lemming  BSN, RN Groman, [name] ([id number]) Entered By: Regan Lemming on 09/17/2014 13:33:10 Blodgett, [place] A. ([id number]) --------------------------------------------------------------------------------  Wound Assessment Details Patient Name: [name], [name] Date of Service: 09/17/2014 1:00 PM Medical Record Number: [id number] Patient Account Number: [id number] Date of Birth/Sex: 05/24/1934 (80 y.o. Female) Treating RN: Afful, RN, BSN, Velva Harman Primary Care Physician: Kelton Pillar Other Clinician: Referring Physician: Kelton Pillar Treating Physician/Extender: Frann Rider in Treatment: 5 Wound Status Wound Number: 2 Primary Trauma, Other Etiology: Wound Location: Upper Leg - Lateral, Posterior Wound Open Wounding Event: Trauma Status: Date Acquired: 06/11/2014 Comorbid Anemia, Congestive Heart Failure, Weeks Of Treatment: 5 History: Type II Diabetes, Osteoarthritis Clustered Wound: No Photos Photo Uploaded By: Regan Lemming on 09/17/2014 16:30:24 Wound Measurements Length: (cm) 0.6 % Reduction Width: (cm) 1.2 % Reduction Depth: (cm) 0.1 Epithelializ Area: (cm) 0.565 Tunneling: Volume: (cm) 0.057 Undermining in Area: -99.6% in Volume: -103.6% ation: None No : No Wound Description Full Thickness Without Classification: Exposed Support Structures Diabetic Severity Grade 1 (Wagner): Wound Margin: Flat and Intact Exudate Amount: Small Exudate Type: Serous Exudate Color: amber Foul Odor After Cleansing: No Wound Bed Granulation Amount: Large (67-100%) Exposed Structure Granulation Quality: Red Fascia Exposed: No [name], [name] ([id number]) Necrotic Amount: None Present (0%) Fat Layer Exposed: No Tendon Exposed: No Muscle Exposed: No Joint Exposed: No Bone Exposed: No Limited to Skin Breakdown Periwound Skin Texture Texture Color No Abnormalities Noted: No No Abnormalities Noted: No Callus: No Atrophie Blanche: No Crepitus: No Cyanosis: No Excoriation:  No Ecchymosis: No Fluctuance: No Erythema: No Friable: No Hemosiderin Staining: No Induration: No Mottled: No Localized Edema: No Pallor: No Rash: No Rubor: No Scarring: No Temperature / Pain Moisture Temperature: No Abnormality No Abnormalities Noted: No Dry / Scaly: No Maceration: No Moist: Yes Wound Preparation Ulcer Cleansing: Rinsed/Irrigated with Saline Topical Anesthetic Applied: None Treatment Notes Wound #2 (Lateral, Posterior Upper Leg) 1. Cleansed with: Clean wound with Normal Saline 3. Peri-wound Care: Skin Prep 4. Dressing Applied: Aquacel Ag 5. Secondary Dressing Applied Bordered Foam Dressing Electronic Signature(s) Signed: 09/17/2014 3:59:33 PM By: Regan Lemming BSN, RN Entered By: Regan Lemming on 09/17/2014 13:44:40 Kilgore, [name] ([id number]) -------------------------------------------------------------------------------- Vitals Details Patient Name: Ensz, Pamala A. Date of Service: 09/17/2014 1:00 PM Medical Record Number: [id number] Patient Account Number: [id number] Date of Birth/Sex: [birth year] (80 y.o. Female) Treating RN: Afful, RN, BSN, Velva Harman Primary Care Physician: Kelton Pillar Other Clinician: Referring Physician: Kelton Pillar Treating Physician/Extender: Frann Rider in Treatment: 5 Vital Signs Time Taken: 13:25 Temperature (F): 98.5 Height (in): 61 Pulse (bpm): 83 Weight (lbs): 224 Respiratory Rate (breaths/min): 18 Body Mass Index (BMI): 42.3 Blood Pressure (mmHg): 114/74 Reference Range: 80 - 120 mg / dl Notes on 2L of oxygen Electronic Signature(s) Signed: 09/17/2014 3:59:33 PM By: Regan Lemming BSN, RN Entered By: Regan Lemming on 09/17/2014 13:28:18

## 2014-09-18 NOTE — Progress Notes (Signed)
[name] ([id number]) Visit Report for 09/17/2014 Chief Complaint Document Details Patient Name: [name], [name] Date of Service: 09/17/2014 1:00 PM Medical Record Patient Account Number: [id number] Number: Afful, RN, BSN, Treating RN: July 25, 1934 (80 y.o. [name] Date of Birth/Sex: Female) Other Clinician: Primary Care Physician: Kelton Pillar Treating Christin Fudge Referring Physician: Kelton Pillar Physician/Extender: Weeks in Treatment: 5 Information Obtained from: Patient Chief Complaint Patient presents to the wound care center for a consult due non healing wound. ulcerated area on her left lower extremity and her left thigh for about 3 months. Electronic Signature(s) Signed: 09/17/2014 2:06:04 PM By: Christin Fudge MD, FACS Entered By: Christin Fudge on 09/17/2014 14:06:04 [name] ([id number]) -------------------------------------------------------------------------------- HPI Details Patient Name: [name], [name] Date of Service: 09/17/2014 1:00 PM Medical Record Patient Account Number: [id number] Number: Afful, RN, BSN, Treating RN: March 18, 1934 (80 y.o. [name] Date of Birth/Sex: Female) Other Clinician: Primary Care Physician: Kelton Pillar Treating Christin Fudge Referring Physician: Kelton Pillar Physician/Extender: Weeks in Treatment: 5 History of Present Illness Location: left lower extremity and left thigh Quality: Patient reports experiencing a sharp pain to affected area(s). Severity: Patient states wound are getting worse. Duration: Patient has had the wound for > 3 months prior to seeking treatment at the wound center Timing: Pain in wound is Intermittent (comes and goes Context: The wound occurred when the patient had a fall in the skilled nursing facility and injured her left lateral lower extremity. Modifying Factors: Consults to this date include: orthopedics where she had a cast applied initially and also has gone to the ER a  few times. Associated Signs and Symptoms: Patient reports having difficulty standing and is in bed for long periods. HPI Description: The patient was taken to the ED on 07/30/14 by her daughter for a worsening wound on her left lower lateral leg which has been there for a while. Xray of the left lower extremity -IMPRESSION:Healing oblique distal tibial fracture. Soft tissue injury along the lateral aspect of the mid/distal fibula. No radiographic findings to suggest acute osteomyelitis. The patient was put on clindamycin and given some wound care. Her last hemoglobin A1c was 8.8. opast medical history significant for -- diabetes mellitus type 2, dyspnea, hypertension, chronic constipation, CHF, history of ankle fracture, vaginal hysterectomy, abdomen and pain on and off, status post flexible sigmoidoscopy. 09/17/2014 -- her daughter is not there at the bedside and we do not have any vascular reports which was supposed to be done a couple of weeks ago. Electronic Signature(s) Signed: 09/17/2014 2:06:34 PM By: Christin Fudge MD, FACS Entered By: Christin Fudge on 09/17/2014 14:06:34 [name], [name] ([id number]) -------------------------------------------------------------------------------- Physical Exam Details Patient Name: [name], [name] Date of Service: 09/17/2014 1:00 PM Medical Record Patient Account Number: [id number] Number: Afful, RN, BSN, Treating RN: [birth year] (80 y.o. [name] Date of Birth/Sex: Female) Other Clinician: Primary Care Physician: Kelton Pillar Treating Christin Fudge Referring Physician: Kelton Pillar Physician/Extender: Weeks in Treatment: 5 Constitutional . Pulse regular. Respirations normal and unlabored. Afebrile. . Eyes Nonicteric. Reactive to light. Ears, Nose, Mouth, and Throat Lips, teeth, and gums WNL.Marland Kitchen Moist mucosa without lesions . Neck supple and nontender. No palpable supraclavicular or cervical adenopathy. Normal sized without  goiter. Respiratory WNL. No retractions.. Cardiovascular Pedal Pulses WNL. No clubbing, cyanosis or edema. Lymphatic No adneopathy. No adenopathy. No adenopathy. Musculoskeletal Adexa without tenderness or enlargement.. Digits and nails w/o clubbing, cyanosis, infection, petechiae, ischemia, or inflammatory conditions.. Integumentary (Hair, Skin) No suspicious lesions. No crepitus or fluctuance.  No peri-wound warmth or erythema. No masses.Marland Kitchen Psychiatric Judgement and insight Intact.. No evidence of depression, anxiety, or agitation.. Notes the left upper thigh wound looks clean and we will not need any debridement here. The lower left lateral leg wound has minimal exposed tendon but it is clean and there is no need of debridement. Electronic Signature(s) Signed: 09/17/2014 2:07:15 PM By: Christin Fudge MD, FACS Entered By: Christin Fudge on 09/17/2014 14:07:14 [name], [name] ([id number]) -------------------------------------------------------------------------------- Physician Orders Details Patient Name: [name], [name] Date of Service: 09/17/2014 1:00 PM Medical Record Patient Account Number: [id number] Number: Afful, RN, BSN, Treating RN: September 30, 1934 (80 y.o. [name] Date of Birth/Sex: Female) Other Clinician: Primary Care Physician: Kelton Pillar Treating Christin Fudge Referring Physician: Kelton Pillar Physician/Extender: Suella Grove in Treatment: 5 Verbal / Phone Orders: Yes Clinician: Afful, RN, BSN, Rita Read Back and Verified: Yes Diagnosis Coding Wound Cleansing Wound #1 Left,Lateral Lower Leg o Clean wound with Normal Saline. Wound #2 Lateral,Posterior Upper Leg o Clean wound with Normal Saline. Anesthetic Wound #1 Left,Lateral Lower Leg o Topical Lidocaine 4% cream applied to wound bed prior to debridement Wound #2 Lateral,Posterior Upper Leg o Topical Lidocaine 4% cream applied to wound bed prior to debridement Skin Barriers/Peri-Wound Care Wound #1  Left,Lateral Lower Leg o Skin Prep Wound #2 Lateral,Posterior Upper Leg o Skin Prep Primary Wound Dressing Wound #1 Left,Lateral Lower Leg o Aquacel Ag Wound #2 Lateral,Posterior Upper Leg o Aquacel Ag Secondary Dressing Wound #1 Left,Lateral Lower Leg o Boardered Foam Dressing Wound #2 Lateral,Posterior Upper Leg o Boardered Foam Dressing [name], [name] ([id number]) Dressing Change Frequency Wound #1 Left,Lateral Lower Leg o Change dressing every other day. Wound #2 Lateral,Posterior Upper Leg o Change dressing every other day. Follow-up Appointments Wound #1 Left,Lateral Lower Leg o Return Appointment in 2 weeks. Wound #2 Lateral,Posterior Upper Leg o Return Appointment in 2 weeks. Off-Loading Wound #1 Left,Lateral Lower Leg o Turn and reposition every 2 hours Wound #2 Lateral,Posterior Upper Leg o Turn and reposition every 2 hours Additional Orders / Instructions Wound #1 Left,Lateral Lower Leg o Increase protein intake. Wound #2 Lateral,Posterior Upper Leg o Increase protein intake. Electronic Signature(s) Signed: 09/17/2014 1:50:08 PM By: Regan Lemming BSN, RN Signed: 09/17/2014 4:14:41 PM By: Christin Fudge MD, FACS Entered By: Regan Lemming on 09/17/2014 13:50:08 Donofrio, [name] ([id number]) -------------------------------------------------------------------------------- Problem List Details Patient Name: [name], [name] Date of Service: 09/17/2014 1:00 PM Medical Record Patient Account Number: [id number] Number: Afful, RN, BSN, Treating RN: 1934-11-01 (80 y.o. 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 Date of Service: 09/17/2014 1:00 PM Medical Record Patient Account Number: [id number] Number: Afful, RN, BSN, Treating RN: 02-Jun-1934 (80 y.o. [name] Date of Birth/Sex: Female) Other Clinician: Primary Care Physician: Kelton Pillar Treating Christin Fudge Referring Physician: Kelton Pillar Physician/Extender: Suella Grove in Treatment: 5 Subjective Chief Complaint Information obtained from Patient Patient presents  to the wound care center for a consult due non healing wound. ulcerated area on her left lower extremity and her left thigh for about 3 months. History of Present Illness (HPI) The following HPI elements were documented for the patient's wound: Location: left lower extremity and left thigh Quality: Patient reports experiencing a sharp pain to affected area(s). Severity: Patient states wound are getting worse. Duration: Patient has had the wound for > 3 months prior to seeking treatment at the wound center Timing: Pain in wound is Intermittent (comes and goes Context: The wound occurred when the patient had a fall in the skilled nursing facility and injured her left lateral lower extremity. Modifying Factors: Consults to this date include: orthopedics where she had a cast applied initially and also has gone to the ER a few times. Associated Signs and Symptoms: Patient reports having difficulty standing and is in bed for long periods. The  patient was taken to the ED on 07/30/14 by her daughter for a worsening wound on her left lower lateral leg which has been there for a while. Xray of the left lower extremity -IMPRESSION:Healing oblique distal tibial fracture. Soft tissue injury along the lateral aspect of the mid/distal fibula. No radiographic findings to suggest acute osteomyelitis. The patient was put on clindamycin and given some wound care. Her last hemoglobin A1c was 8.8. past medical history significant for -- diabetes mellitus type 2, dyspnea, hypertension, chronic constipation, CHF, history of ankle fracture, vaginal hysterectomy, abdomen and pain on and off, status post flexible sigmoidoscopy. 09/17/2014 -- her daughter is not there at the bedside and we do not have any vascular reports which was supposed to be done a couple of weeks ago. [name], [name] ([id number]) Objective Constitutional Pulse regular. Respirations normal and unlabored. Afebrile. Vitals Time Taken: 1:25 PM, Height: 61 in, Weight: 224 lbs, BMI: 42.3, Temperature: 98.5 F, Pulse: 83 bpm, Respiratory Rate: 18 breaths/min, Blood Pressure: 114/74 mmHg. General Notes: on 2L of oxygen Eyes Nonicteric. Reactive to light. Ears, Nose, Mouth, and Throat Lips, teeth, and gums WNL.Marland Kitchen Moist mucosa without lesions . Neck supple and nontender. No palpable supraclavicular or cervical adenopathy. Normal sized without goiter. Respiratory WNL. No retractions.. Cardiovascular Pedal Pulses WNL. No clubbing, cyanosis or edema. Lymphatic No adneopathy. No adenopathy. No adenopathy. Musculoskeletal Adexa without tenderness or enlargement.. Digits and nails w/o clubbing, cyanosis, infection, petechiae, ischemia, or inflammatory conditions.Marland Kitchen Psychiatric Judgement and insight Intact.. No evidence of depression, anxiety, or agitation.. General Notes: the left upper thigh wound looks clean and we will not need any debridement here. The lower left lateral leg wound  has minimal exposed tendon but it is clean and there is no need of debridement. Integumentary (Hair, Skin) No suspicious lesions. No crepitus or fluctuance. No peri-wound warmth or erythema. No masses.. Wound #1 status is Open. Original cause of wound was Trauma. The wound is located on the Left,Lateral Lower Leg. The wound measures 2.2cm length x 1cm width x 0.9cm depth; 1.728cm^2 area and 1.555cm^3 volume. There is tendon exposed. There is no tunneling noted, however, there is undermining starting at 12:00 and ending at 12:00 with a maximum distance of 1cm. There is a medium amount of serous drainage noted. The wound margin is flat and intact. There is large (67-100%) red granulation within the wound bed. There is no necrotic tissue within the wound bed. The periwound skin appearance exhibited: Moist. The periwound skin appearance did not exhibit: Callus, Crepitus, Excoriation, Fluctuance, Munos, Micheala A. ([id number]) Friable, Induration, Localized Edema,  Rash, Scarring, Dry/Scaly, Maceration, Atrophie Blanche, Cyanosis, Ecchymosis, Hemosiderin Staining, Mottled, Pallor, Rubor, Erythema. Periwound temperature was noted as No Abnormality. The periwound has tenderness on palpation. Wound #2 status is Open. Original cause of wound was Trauma. The wound is located on the Lateral,Posterior Upper Leg. The wound measures 0.6cm length x 1.2cm width x 0.1cm depth; 0.565cm^2 area and 0.057cm^3 volume. The wound is limited to skin breakdown. There is no tunneling or undermining noted. There is a small amount of serous drainage noted. The wound margin is flat and intact. There is large (67-100%) red granulation within the wound bed. There is no necrotic tissue within the wound bed. The periwound skin appearance exhibited: Moist. The periwound skin appearance did not exhibit: Callus, Crepitus, Excoriation, Fluctuance, Friable, Induration, Localized Edema, Rash, Scarring, Dry/Scaly, Maceration, Atrophie  Blanche, Cyanosis, Ecchymosis, Hemosiderin Staining, Mottled, Pallor, Rubor, Erythema. Periwound temperature was noted as No Abnormality. Assessment Active Problems ICD-10 E11.622 - Type 2 diabetes mellitus with other skin ulcer S81.812A - Laceration without foreign body, left lower leg, initial encounter L97.122 - Non-pressure chronic ulcer of left thigh with fat layer exposed E66.01 - Morbid (severe) obesity due to excess calories I have recommended Aquacel Ag to both wounds and she will continue dressings on alternate day and come back and see me next week hopefully with her vascular reports. Plan Wound Cleansing: Wound #1 Left,Lateral Lower Leg: Clean wound with Normal Saline. Wound #2 Lateral,Posterior Upper Leg: Clean wound with Normal Saline. Anesthetic: Wound #1 Left,Lateral Lower Leg: Topical Lidocaine 4% cream applied to wound bed prior to debridement Wound #2 Lateral,Posterior Upper Leg: Topical Lidocaine 4% cream applied to wound bed prior to debridement Skin Barriers/Peri-Wound Care: Wound #1 Left,Lateral Lower Leg: [name], [name] ([id number]) Skin Prep Wound #2 Lateral,Posterior Upper Leg: Skin Prep Primary Wound Dressing: Wound #1 Left,Lateral Lower Leg: Aquacel Ag Wound #2 Lateral,Posterior Upper Leg: Aquacel Ag Secondary Dressing: Wound #1 Left,Lateral Lower Leg: Boardered Foam Dressing Wound #2 Lateral,Posterior Upper Leg: Boardered Foam Dressing Dressing Change Frequency: Wound #1 Left,Lateral Lower Leg: Change dressing every other day. Wound #2 Lateral,Posterior Upper Leg: Change dressing every other day. Follow-up Appointments: Wound #1 Left,Lateral Lower Leg: Return Appointment in 2 weeks. Wound #2 Lateral,Posterior Upper Leg: Return Appointment in 2 weeks. Off-Loading: Wound #1 Left,Lateral Lower Leg: Turn and reposition every 2 hours Wound #2 Lateral,Posterior Upper Leg: Turn and reposition every 2 hours Additional Orders /  Instructions: Wound #1 Left,Lateral Lower Leg: Increase protein intake. Wound #2 Lateral,Posterior Upper Leg: Increase protein intake. I have recommended Aquacel Ag to both wounds and she will continue dressings on alternate day and come back and see me next week hopefully with her vascular reports. Electronic Signature(s) Signed: 09/17/2014 2:08:18 PM By: Christin Fudge MD, FACS Entered By: Christin Fudge on 09/17/2014 14:08:17 Finkbiner, [name] ([id number]) -------------------------------------------------------------------------------- SuperBill Details Patient Name: [name], [name] Date of Service: 09/17/2014 Medical Record Patient Account Number: [id number] Number: Afful, RN, BSN, Treating RN: 12-Aug-1934 (80 y.o. 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 E66.01 Morbid (severe) obesity due to excess calories Facility Procedures CPT4 Code: [id number] Description: 99213 - WOUND CARE VISIT-LEV 3 EST PT Modifier: Quantity: 1 Physician Procedures CPT4 Code Description:  [id number] 17001 - WC PHYS LEVEL 3 - EST PT ICD-10 Description Diagnosis E11.622 Type 2 diabetes mellitus with other skin ulcer S81.812A Laceration without foreign body, left lower leg, ini L97.122 Non-pressure chronic ulcer of left  thigh with fat la Modifier: tial encounter yer exposed Quantity: 1 Electronic Signature(s) Signed: 09/17/2014 2:08:33 PM By: Christin Fudge MD, FACS Entered By: Christin Fudge on 09/17/2014 14:08:33

## 2014-09-19 ENCOUNTER — Encounter (HOSPITAL_COMMUNITY): Payer: Self-pay | Admitting: Emergency Medicine

## 2014-09-19 ENCOUNTER — Encounter (HOSPITAL_COMMUNITY): Payer: Self-pay

## 2014-09-19 ENCOUNTER — Other Ambulatory Visit: Payer: Self-pay

## 2014-09-19 ENCOUNTER — Encounter (HOSPITAL_COMMUNITY)
Admission: RE | Admit: 2014-09-19 | Discharge: 2014-09-19 | Disposition: A | Discharge status: Home / Self Care | Payer: Medicare Other | Source: Ambulatory Visit | Attending: Vascular Surgery | Admitting: Vascular Surgery

## 2014-09-19 ENCOUNTER — Observation Stay (HOSPITAL_BASED_OUTPATIENT_CLINIC_OR_DEPARTMENT_OTHER)
Admission: EM | Admit: 2014-09-19 | Discharge: 2014-09-20 | Disposition: A | Discharge status: Skilled Nursing Facility | Payer: Medicare Other | Source: Home / Self Care | Attending: Emergency Medicine | Admitting: Emergency Medicine

## 2014-09-19 DIAGNOSIS — Z881 Allergy status to other antibiotic agents status: Secondary | ICD-10-CM | POA: Insufficient documentation

## 2014-09-19 DIAGNOSIS — G4730 Sleep apnea, unspecified: Secondary | ICD-10-CM | POA: Diagnosis not present

## 2014-09-19 DIAGNOSIS — I4891 Unspecified atrial fibrillation: Secondary | ICD-10-CM | POA: Insufficient documentation

## 2014-09-19 DIAGNOSIS — N179 Acute kidney failure, unspecified: Secondary | ICD-10-CM | POA: Diagnosis present

## 2014-09-19 DIAGNOSIS — Z887 Allergy status to serum and vaccine status: Secondary | ICD-10-CM

## 2014-09-19 DIAGNOSIS — E875 Hyperkalemia: Secondary | ICD-10-CM

## 2014-09-19 DIAGNOSIS — J9611 Chronic respiratory failure with hypoxia: Secondary | ICD-10-CM | POA: Insufficient documentation

## 2014-09-19 DIAGNOSIS — L8990 Pressure ulcer of unspecified site, unspecified stage: Secondary | ICD-10-CM | POA: Insufficient documentation

## 2014-09-19 DIAGNOSIS — E119 Type 2 diabetes mellitus without complications: Secondary | ICD-10-CM | POA: Insufficient documentation

## 2014-09-19 DIAGNOSIS — Z87891 Personal history of nicotine dependence: Secondary | ICD-10-CM

## 2014-09-19 DIAGNOSIS — Z90710 Acquired absence of both cervix and uterus: Secondary | ICD-10-CM

## 2014-09-19 DIAGNOSIS — Z8639 Personal history of other endocrine, nutritional and metabolic disease: Secondary | ICD-10-CM

## 2014-09-19 DIAGNOSIS — I129 Hypertensive chronic kidney disease with stage 1 through stage 4 chronic kidney disease, or unspecified chronic kidney disease: Secondary | ICD-10-CM

## 2014-09-19 DIAGNOSIS — E669 Obesity, unspecified: Secondary | ICD-10-CM | POA: Insufficient documentation

## 2014-09-19 DIAGNOSIS — K5790 Diverticulosis of intestine, part unspecified, without perforation or abscess without bleeding: Secondary | ICD-10-CM

## 2014-09-19 DIAGNOSIS — N183 Chronic kidney disease, stage 3 unspecified: Secondary | ICD-10-CM | POA: Diagnosis present

## 2014-09-19 DIAGNOSIS — E1122 Type 2 diabetes mellitus with diabetic chronic kidney disease: Secondary | ICD-10-CM

## 2014-09-19 DIAGNOSIS — Z01818 Encounter for other preprocedural examination: Secondary | ICD-10-CM

## 2014-09-19 DIAGNOSIS — Z993 Dependence on wheelchair: Secondary | ICD-10-CM | POA: Insufficient documentation

## 2014-09-19 DIAGNOSIS — M1990 Unspecified osteoarthritis, unspecified site: Secondary | ICD-10-CM | POA: Insufficient documentation

## 2014-09-19 DIAGNOSIS — I739 Peripheral vascular disease, unspecified: Secondary | ICD-10-CM | POA: Insufficient documentation

## 2014-09-19 DIAGNOSIS — Z888 Allergy status to other drugs, medicaments and biological substances status: Secondary | ICD-10-CM | POA: Insufficient documentation

## 2014-09-19 DIAGNOSIS — Z91040 Latex allergy status: Secondary | ICD-10-CM

## 2014-09-19 DIAGNOSIS — I70242 Atherosclerosis of native arteries of left leg with ulceration of calf: Secondary | ICD-10-CM | POA: Diagnosis not present

## 2014-09-19 DIAGNOSIS — Z01812 Encounter for preprocedural laboratory examination: Secondary | ICD-10-CM

## 2014-09-19 DIAGNOSIS — Z880 Allergy status to penicillin: Secondary | ICD-10-CM | POA: Insufficient documentation

## 2014-09-19 DIAGNOSIS — Z6830 Body mass index (BMI) 30.0-30.9, adult: Secondary | ICD-10-CM | POA: Insufficient documentation

## 2014-09-19 DIAGNOSIS — Z794 Long term (current) use of insulin: Secondary | ICD-10-CM | POA: Insufficient documentation

## 2014-09-19 DIAGNOSIS — K219 Gastro-esophageal reflux disease without esophagitis: Secondary | ICD-10-CM | POA: Diagnosis present

## 2014-09-19 DIAGNOSIS — Z5181 Encounter for therapeutic drug level monitoring: Secondary | ICD-10-CM

## 2014-09-19 DIAGNOSIS — Z9981 Dependence on supplemental oxygen: Secondary | ICD-10-CM

## 2014-09-19 DIAGNOSIS — Z7902 Long term (current) use of antithrombotics/antiplatelets: Secondary | ICD-10-CM | POA: Insufficient documentation

## 2014-09-19 DIAGNOSIS — Z79899 Other long term (current) drug therapy: Secondary | ICD-10-CM

## 2014-09-19 DIAGNOSIS — N189 Chronic kidney disease, unspecified: Secondary | ICD-10-CM | POA: Diagnosis not present

## 2014-09-19 DIAGNOSIS — I5032 Chronic diastolic (congestive) heart failure: Secondary | ICD-10-CM | POA: Diagnosis not present

## 2014-09-19 DIAGNOSIS — Z7901 Long term (current) use of anticoagulants: Secondary | ICD-10-CM | POA: Diagnosis not present

## 2014-09-19 DIAGNOSIS — E785 Hyperlipidemia, unspecified: Secondary | ICD-10-CM | POA: Insufficient documentation

## 2014-09-19 DIAGNOSIS — I340 Nonrheumatic mitral (valve) insufficiency: Secondary | ICD-10-CM

## 2014-09-19 DIAGNOSIS — N182 Chronic kidney disease, stage 2 (mild): Secondary | ICD-10-CM | POA: Diagnosis not present

## 2014-09-19 DIAGNOSIS — I5042 Chronic combined systolic (congestive) and diastolic (congestive) heart failure: Secondary | ICD-10-CM | POA: Insufficient documentation

## 2014-09-19 DIAGNOSIS — I10 Essential (primary) hypertension: Secondary | ICD-10-CM

## 2014-09-19 DIAGNOSIS — L97229 Non-pressure chronic ulcer of left calf with unspecified severity: Secondary | ICD-10-CM | POA: Diagnosis present

## 2014-09-19 DIAGNOSIS — Z6839 Body mass index (BMI) 39.0-39.9, adult: Secondary | ICD-10-CM | POA: Diagnosis not present

## 2014-09-19 DIAGNOSIS — Z7952 Long term (current) use of systemic steroids: Secondary | ICD-10-CM | POA: Diagnosis not present

## 2014-09-19 DIAGNOSIS — L97223 Non-pressure chronic ulcer of left calf with necrosis of muscle: Secondary | ICD-10-CM | POA: Diagnosis not present

## 2014-09-19 HISTORY — DX: Personal history of other diseases of the digestive system: Z87.19

## 2014-09-19 LAB — COMPREHENSIVE METABOLIC PANEL
ALBUMIN: 3.4 g/dL — AB (ref 3.5–5.0)
ALT: 31 U/L (ref 14–54)
AST: 28 U/L (ref 15–41)
Alkaline Phosphatase: 98 U/L (ref 38–126)
Anion gap: 13 (ref 5–15)
BUN: 32 mg/dL — AB (ref 6–20)
CHLORIDE: 99 mmol/L — AB (ref 101–111)
CO2: 18 mmol/L — ABNORMAL LOW (ref 22–32)
Calcium: 9.2 mg/dL (ref 8.9–10.3)
Creatinine, Ser: 1.51 mg/dL — ABNORMAL HIGH (ref 0.44–1.00)
GFR calc Af Amer: 37 mL/min — ABNORMAL LOW (ref >60)
GFR calc non Af Amer: 32 mL/min — ABNORMAL LOW (ref >60)
Glucose, Bld: 133 mg/dL — ABNORMAL HIGH (ref 65–99)
Potassium: >7.5 mmol/L (ref 3.5–5.1)
Sodium: 130 mmol/L — ABNORMAL LOW (ref 135–145)
TOTAL PROTEIN: 6.1 g/dL — AB (ref 6.5–8.1)
Total Bilirubin: 0.8 mg/dL (ref 0.3–1.2)

## 2014-09-19 LAB — URINE MICROSCOPIC-ADD ON

## 2014-09-19 LAB — I-STAT CHEM 8, ED
BUN: 42 mg/dL — ABNORMAL HIGH (ref 6–20)
CALCIUM ION: 1.17 mmol/L (ref 1.13–1.30)
CREATININE: 1.5 mg/dL — AB (ref 0.44–1.00)
Chloride: 102 mmol/L (ref 101–111)
GLUCOSE: 134 mg/dL — AB (ref 65–99)
HCT: 43 % (ref 36.0–46.0)
HEMOGLOBIN: 14.6 g/dL (ref 12.0–15.0)
Potassium: 7.4 mmol/L (ref 3.5–5.1)
Sodium: 129 mmol/L — ABNORMAL LOW (ref 135–145)
TCO2: 21 mmol/L (ref 0–100)

## 2014-09-19 LAB — URINALYSIS, ROUTINE W REFLEX MICROSCOPIC
Bilirubin Urine: NEGATIVE
Glucose, UA: 250 mg/dL — AB
Ketones, ur: NEGATIVE mg/dL
NITRITE: NEGATIVE
PH: 6.5 (ref 5.0–8.0)
Protein, ur: NEGATIVE mg/dL
SPECIFIC GRAVITY, URINE: 1.008 (ref 1.005–1.030)
Urobilinogen, UA: 0.2 mg/dL (ref 0.0–1.0)

## 2014-09-19 LAB — CBC
HCT: 40.4 % (ref 36.0–46.0)
Hemoglobin: 12.6 g/dL (ref 12.0–15.0)
MCH: 32.7 pg (ref 26.0–34.0)
MCHC: 31.2 g/dL (ref 30.0–36.0)
MCV: 104.9 fL — ABNORMAL HIGH (ref 78.0–100.0)
Platelets: 192 10*3/uL (ref 150–400)
RBC: 3.85 MIL/uL — AB (ref 3.87–5.11)
RDW: 16.6 % — ABNORMAL HIGH (ref 11.5–15.5)
WBC: 8.8 10*3/uL (ref 4.0–10.5)

## 2014-09-19 LAB — BASIC METABOLIC PANEL
Anion gap: 9 (ref 5–15)
BUN: 31 mg/dL — AB (ref 6–20)
CALCIUM: 9.2 mg/dL (ref 8.9–10.3)
CHLORIDE: 102 mmol/L (ref 101–111)
CO2: 22 mmol/L (ref 22–32)
CREATININE: 1.51 mg/dL — AB (ref 0.44–1.00)
GFR calc Af Amer: 37 mL/min — ABNORMAL LOW (ref >60)
GFR calc non Af Amer: 32 mL/min — ABNORMAL LOW (ref >60)
Glucose, Bld: 124 mg/dL — ABNORMAL HIGH (ref 65–99)
Potassium: 7.1 mmol/L (ref 3.5–5.1)
Sodium: 133 mmol/L — ABNORMAL LOW (ref 135–145)

## 2014-09-19 LAB — SURGICAL PCR SCREEN
MRSA, PCR: NEGATIVE
Staphylococcus aureus: NEGATIVE

## 2014-09-19 LAB — GLUCOSE, CAPILLARY: GLUCOSE-CAPILLARY: 135 mg/dL — AB (ref 65–99)

## 2014-09-19 MED ORDER — HEPARIN SODIUM (PORCINE) 5000 UNIT/ML IJ SOLN
5000.0000 [IU] | Freq: Three times a day (TID) | INTRAMUSCULAR | Status: DC
  Administered 2014-09-20 (×2): 5000 [IU] via SUBCUTANEOUS
  Filled 2014-09-19 (×4): qty 1

## 2014-09-19 MED ORDER — INSULIN ASPART 100 UNIT/ML ~~LOC~~ SOLN
10.0000 [IU] | Freq: Once | SUBCUTANEOUS | Status: DC
  Filled 2014-09-19: qty 1

## 2014-09-19 MED ORDER — ADULT MULTIVITAMIN W/MINERALS CH
1.0000 | ORAL_TABLET | Freq: Every day | ORAL | Status: DC
  Administered 2014-09-20 (×2): 1 via ORAL
  Filled 2014-09-19 (×2): qty 1

## 2014-09-19 MED ORDER — ONDANSETRON HCL 4 MG/2ML IJ SOLN: 4.0000 mg | Freq: Four times a day (QID) | INTRAMUSCULAR | Status: DC | PRN

## 2014-09-19 MED ORDER — PANTOPRAZOLE SODIUM 40 MG PO TBEC: 40.0000 mg | DELAYED_RELEASE_TABLET | Freq: Every day | ORAL | Status: DC

## 2014-09-19 MED ORDER — SODIUM CHLORIDE 0.9 % IJ SOLN
3.0000 mL | Freq: Two times a day (BID) | INTRAMUSCULAR | Status: DC
  Administered 2014-09-20 (×2): 3 mL via INTRAVENOUS

## 2014-09-19 MED ORDER — ACETAMINOPHEN 650 MG RE SUPP: 650.0000 mg | Freq: Four times a day (QID) | RECTAL | Status: DC | PRN

## 2014-09-19 MED ORDER — ONDANSETRON HCL 4 MG PO TABS: 4.0000 mg | ORAL_TABLET | Freq: Four times a day (QID) | ORAL | Status: DC | PRN

## 2014-09-19 MED ORDER — FOLIC ACID 1 MG PO TABS
1.0000 mg | ORAL_TABLET | Freq: Every day | ORAL | Status: DC
  Administered 2014-09-20 (×2): 1 mg via ORAL
  Filled 2014-09-19 (×2): qty 1

## 2014-09-19 MED ORDER — ATORVASTATIN CALCIUM 40 MG PO TABS
40.0000 mg | ORAL_TABLET | Freq: Every day | ORAL | Status: DC
  Administered 2014-09-20 (×2): 40 mg via ORAL
  Filled 2014-09-19 (×2): qty 1

## 2014-09-19 MED ORDER — BISACODYL 10 MG RE SUPP: 10.0000 mg | Freq: Every day | RECTAL | Status: DC | PRN

## 2014-09-19 MED ORDER — CALCIUM GLUCONATE 10 % IV SOLN
1.0000 g | Freq: Once | INTRAVENOUS | Status: AC
  Administered 2014-09-19: 1 g via INTRAVENOUS
  Filled 2014-09-19: qty 10

## 2014-09-19 MED ORDER — TRAMADOL HCL 50 MG PO TABS
50.0000 mg | ORAL_TABLET | Freq: Three times a day (TID) | ORAL | Status: DC
  Administered 2014-09-20 (×2): 50 mg via ORAL
  Filled 2014-09-19 (×2): qty 1

## 2014-09-19 MED ORDER — FERROUS SULFATE 325 (65 FE) MG PO TABS
325.0000 mg | ORAL_TABLET | Freq: Every day | ORAL | Status: DC
  Administered 2014-09-20 (×2): 325 mg via ORAL
  Filled 2014-09-19 (×2): qty 1

## 2014-09-19 MED ORDER — DIPHENHYDRAMINE HCL 25 MG PO CAPS
25.0000 mg | ORAL_CAPSULE | ORAL | Status: DC | PRN
  Administered 2014-09-20: 25 mg via ORAL
  Filled 2014-09-19: qty 1

## 2014-09-19 MED ORDER — DEXTROSE 50 % IV SOLN
50.0000 mL | Freq: Once | INTRAVENOUS | Status: AC
  Administered 2014-09-19: 50 mL via INTRAVENOUS
  Filled 2014-09-19: qty 50

## 2014-09-19 MED ORDER — DOCUSATE SODIUM 100 MG PO CAPS
100.0000 mg | ORAL_CAPSULE | Freq: Two times a day (BID) | ORAL | Status: DC
  Administered 2014-09-20: 100 mg via ORAL
  Filled 2014-09-19 (×3): qty 1

## 2014-09-19 MED ORDER — PREDNISONE 10 MG PO TABS
10.0000 mg | ORAL_TABLET | Freq: Every day | ORAL | Status: DC
  Administered 2014-09-20: 10 mg via ORAL
  Filled 2014-09-19 (×2): qty 1

## 2014-09-19 MED ORDER — SODIUM POLYSTYRENE SULFONATE 15 GM/60ML PO SUSP
30.0000 g | Freq: Once | ORAL | Status: AC
  Administered 2014-09-20: 30 g via ORAL
  Filled 2014-09-19: qty 120

## 2014-09-19 MED ORDER — SENNOSIDES-DOCUSATE SODIUM 8.6-50 MG PO TABS
1.0000 | ORAL_TABLET | Freq: Every day | ORAL | Status: DC
  Filled 2014-09-19 (×2): qty 1

## 2014-09-19 MED ORDER — POLYETHYLENE GLYCOL 3350 17 G PO PACK
17.0000 g | PACK | Freq: Every day | ORAL | Status: DC
  Administered 2014-09-20: 17 g via ORAL
  Filled 2014-09-19 (×2): qty 1

## 2014-09-19 MED ORDER — SODIUM CHLORIDE 0.9 % IV SOLN
INTRAVENOUS | Status: DC
  Administered 2014-09-20: 02:00:00 via INTRAVENOUS

## 2014-09-19 MED ORDER — INSULIN ASPART 100 UNIT/ML ~~LOC~~ SOLN
10.0000 [IU] | Freq: Once | SUBCUTANEOUS | Status: AC
  Administered 2014-09-19: 10 [IU] via INTRAVENOUS

## 2014-09-19 MED ORDER — HYDROCODONE-ACETAMINOPHEN 5-325 MG PO TABS: 1.0000 | ORAL_TABLET | Freq: Three times a day (TID) | ORAL | Status: DC | PRN

## 2014-09-19 MED ORDER — LORATADINE 10 MG PO TABS
10.0000 mg | ORAL_TABLET | Freq: Every day | ORAL | Status: DC
  Administered 2014-09-20: 10 mg via ORAL
  Filled 2014-09-19 (×2): qty 1

## 2014-09-19 MED ORDER — ALBUTEROL SULFATE (2.5 MG/3ML) 0.083% IN NEBU
2.5000 mg | INHALATION_SOLUTION | Freq: Once | RESPIRATORY_TRACT | Status: AC
  Administered 2014-09-19: 2.5 mg via RESPIRATORY_TRACT
  Filled 2014-09-19: qty 3

## 2014-09-19 MED ORDER — INSULIN ASPART 100 UNIT/ML ~~LOC~~ SOLN: 0.0000 [IU] | SUBCUTANEOUS | Status: DC

## 2014-09-19 MED ORDER — INSULIN GLARGINE 100 UNIT/ML ~~LOC~~ SOLN
10.0000 [IU] | Freq: Every day | SUBCUTANEOUS | Status: DC
  Filled 2014-09-19 (×2): qty 0.1

## 2014-09-19 MED ORDER — PANTOPRAZOLE SODIUM 40 MG PO TBEC
40.0000 mg | DELAYED_RELEASE_TABLET | Freq: Every day | ORAL | Status: DC
Start: 2014-09-19 — End: 2014-09-20
  Administered 2014-09-20: 40 mg via ORAL
  Filled 2014-09-19: qty 1

## 2014-09-19 MED ORDER — CARVEDILOL 3.125 MG PO TABS
3.1250 mg | ORAL_TABLET | Freq: Two times a day (BID) | ORAL | Status: DC
  Administered 2014-09-20: 3.125 mg via ORAL
  Filled 2014-09-19 (×3): qty 1

## 2014-09-19 MED ORDER — VITAMIN B-1 100 MG PO TABS
100.0000 mg | ORAL_TABLET | Freq: Every day | ORAL | Status: DC
  Administered 2014-09-20: 100 mg via ORAL
  Filled 2014-09-19 (×3): qty 1

## 2014-09-19 MED ORDER — LUBIPROSTONE 24 MCG PO CAPS
24.0000 ug | ORAL_CAPSULE | Freq: Two times a day (BID) | ORAL | Status: DC
  Administered 2014-09-20: 24 ug via ORAL
  Filled 2014-09-19 (×3): qty 1

## 2014-09-19 MED ORDER — SENNA-DOCUSATE SODIUM 8.6-50 MG PO TABS: 1.0000 | ORAL_TABLET | Freq: Every day | ORAL | Status: DC

## 2014-09-19 MED ORDER — DIPHENHYDRAMINE HCL 50 MG/ML IJ SOLN
25.0000 mg | Freq: Once | INTRAMUSCULAR | Status: AC
  Administered 2014-09-20: 25 mg via INTRAVENOUS
  Filled 2014-09-19: qty 1

## 2014-09-19 MED ORDER — ALBUTEROL SULFATE (2.5 MG/3ML) 0.083% IN NEBU: 2.5000 mg | INHALATION_SOLUTION | RESPIRATORY_TRACT | Status: DC | PRN

## 2014-09-19 MED ORDER — SODIUM POLYSTYRENE SULFONATE 15 GM/60ML PO SUSP
30.0000 g | Freq: Once | ORAL | Status: AC
  Administered 2014-09-19: 30 g via RECTAL
  Filled 2014-09-19: qty 120

## 2014-09-19 MED ORDER — ACETAMINOPHEN 325 MG PO TABS: 650.0000 mg | ORAL_TABLET | Freq: Four times a day (QID) | ORAL | Status: DC | PRN

## 2014-09-19 MED ORDER — INSULIN GLARGINE 300 UNIT/ML ~~LOC~~ SOPN: 10.0000 [IU] | PEN_INJECTOR | Freq: Every day | SUBCUTANEOUS | Status: DC

## 2014-09-19 NOTE — Progress Notes (Addendum)
PCP is Dr Laurann [place] Cardiologist is Dr. Percival Spanish Daughter states she  Not had a stress test and echo over 10 years ago Denies ever having a card cath. Daughter reports she has an open area to her left hip and left leg. Daughter reports her blood sugar was 82  Yesterday morning.  [name] is on O2 at [facility]

## 2014-09-19 NOTE — ED Provider Notes (Signed)
CSN: [id number]     Arrival date & time 09/19/14  1603 History   First MD Initiated Contact with Patient 09/19/14 1646     Chief Complaint  Patient presents with  . Abnormal Lab     (Consider location/radiation/quality/duration/timing/severity/associated sxs/prior Treatment) The history is provided by a relative, the nursing home and medical records. The history is limited by the condition of the patient. No language interpreter was used.   Patient is a 80 year old female past medical history of congestive heart failure, diverticulitis, left ankle fracture with residual chronic wound who presents today with known hyperkalemia that was found on preoperative labs were performed immediately prior to arrival. Patient does reside in the assisted living facility. Per daughter is at bedside she states that over the last 3 weeks the patient has been increasingly confused. She states the patient has Rales at the potassium being too high into low in the past. She relates that she has also learned the patient has had a urinary tract infection the last 3 weeks and has been on antibiotics for that she is unsure what antibiotic she is on this point. Patient currently denies any chest pain but is endorsing shortness of breath. Patient is on oxygen at baseline at home. Patient denies any abdominal pain. She does have a wound to her left leg and was being scheduled for surgery for stent placement in her vessels to help with wound healing. Patient is afebrile here. Her potassium did have noted hemolysis at the outpatient lab was noted to be 7.5. Patient does have a history of atrial fibrillation in the past as well.   Past Medical History  Diagnosis Date  . HTN (hypertension)   . Diverticulosis   . Hemorrhoid   . Osteoarthritis   . Constipation, chronic   . H/O: hysterectomy   . Systolic and diastolic CHF, chronic     a. Echo 07/2011: Technically limited; inferior HK, inferolateral and possibly lateral wall HK,  EF 40%, moderate LVE, aortic sclerosis without stenosis, mild MR, mild LAE, question RV dysfunction;   b.  Echo (04/2013): EF 50-55%, normal wall motion, moderate MR, mild to moderate LAE, PASP 37  . Female bladder prolapse     with prolapse uterus, cystocele, s/p TVH/BSO 12/2006  . Obesity (BMI 30-39.9)     wt. 94.1 kg 12/2010  . Moderate mitral regurgitation     a. Mod to Sev by echo 01/2009;  b. mild MR by echo 2013  . Atrial fibrillation 10/13/2011    coumadin Rx  . Iron deficiency anemia   . Pneumonia   . Shortness of breath dyspnea   . GERD (gastroesophageal reflux disease)   . DM type 2 (diabetes mellitus, type 2) 07/25/2014  . Hyperlipidemia   . Ankle fracture   . Fracture, ankle Dec. 26, 2015    Left  . Fall at home March 2016  . Dysrhythmia   . History of hiatal hernia    Past Surgical History  Procedure Laterality Date  . Wrist surgery    . Vaginal hysterectomy,cystocele and prolapse  repair  NOV. 2008  . Abdominal hysterectomy    . Colonoscopy N/A 07/18/2013    Procedure: COLONOSCOPY;  Surgeon: Wonda Horner, MD;  Location: [facility] END[facility];  Service: Endoscopy;  Laterality: N/A;  . Flexible sigmoidoscopy N/A 03/08/2014    Procedure: FLEXIBLE SIGMOIDOSCOPY;  Surgeon: Lear Ng, MD;  Location: [facility] ENDOSCOPY;  Service: Endoscopy;  Laterality: N/A;  need hoyer lift   Family History  Problem Relation Age of Onset  . Diabetes Brother   . Hypertension Mother     deceased @ 66  . Leukemia Brother   . Cancer Brother   . Stroke Mother   . Diabetes Father     deceased in his 75's   Social History  Substance Use Topics  . Smoking status: Never Smoker   . Smokeless tobacco: Former Systems developer    Types: Snuff  . Alcohol Use: No   OB History    Gravida Para Term Preterm AB TAB SAB Ectopic Multiple Living   6 6             Review of Systems  Constitutional: Positive for fatigue. Negative for fever and chills.  HENT: Negative for congestion and rhinorrhea.   Eyes:  Negative for photophobia and visual disturbance.  Respiratory: Negative for shortness of breath and wheezing.   Cardiovascular: Positive for leg swelling (At baseline). Negative for chest pain.  Gastrointestinal: Negative for nausea, vomiting and abdominal pain.  Genitourinary: Negative for dysuria and hematuria.  Skin: Positive for wound (Left leg being managed by wound care specialist.).  Neurological: Negative for syncope.  Psychiatric/Behavioral: Positive for confusion (Daughter relates she's noticed this is worsening in the last 3 weeks). Negative for behavioral problems and agitation.  All other systems reviewed and are negative.     Allergies  Ativan; Levaquin; Lexapro; Penicillins; Tetanus-diphtheria toxoids td; and Latex  Home Medications   Prior to Admission medications   Medication Sig Start Date End Date Taking? Authorizing Provider  albuterol (PROVENTIL) (2.5 MG/3ML) 0.083% nebulizer solution Take 2.5 mg by nebulization every 4 (four) hours as needed for wheezing or shortness of breath (For wheezing and shortness of breath).    Yes Historical Provider, MD  Amino Acids-Protein Hydrolys (FEEDING SUPPLEMENT, PRO-STAT SUGAR FREE 64,) LIQD Take 30 mLs by mouth 2 (two) times daily.   Yes Historical Provider, MD  atorvastatin (LIPITOR) 40 MG tablet Take 40 mg by mouth daily.   Yes Historical Provider, MD  bisacodyl (DULCOLAX) 10 MG suppository Place 10 mg rectally daily as needed for moderate constipation (if constipation not relieved by MOM).   Yes Historical Provider, MD  carvedilol (COREG) 3.125 MG tablet Take 3.125 mg by mouth 2 (two) times daily with a meal. Hold if SBP <100   Yes Historical Provider, MD  ferrous sulfate 325 (65 FE) MG tablet Take 325 mg by mouth daily.   Yes Historical Provider, MD  Insulin Glargine (TOUJEO SOLOSTAR) 300 UNIT/ML SOPN Inject 10 Units into the skin at bedtime.   Yes Historical Provider, MD  insulin lispro (HUMALOG KWIKPEN) 100 UNIT/ML KiwkPen  Inject 0-10 Units into the skin 4 (four) times daily -  before meals and at bedtime. 150 or less= 0 units 151-200= 2 units 201-250= 4 units 251-300= 6 units 301-350= 8 units 351-400= 10 units   Yes Historical Provider, MD  lactulose (CHRONULAC) 10 GM/15ML solution Take 10 g by mouth daily.   Yes Historical Provider, MD  loratadine (CLARITIN) 10 MG tablet Take 10 mg by mouth daily.   Yes Historical Provider, MD  lubiprostone (AMITIZA) 24 MCG capsule Take 1 capsule (24 mcg total) by mouth 2 (two) times daily with a meal. Patient taking differently: Take 24 mcg by mouth 2 (two) times daily with a meal. For constipation 07/19/13  Yes Velvet Bathe, MD  pantoprazole (PROTONIX) 40 MG tablet Take 1 tablet (40 mg total) by mouth 2 (two) times daily. Patient taking differently: Take 40 mg  by mouth daily. For GERD 08/10/13  Yes Barton Dubois, MD  polyethylene glycol [facility] / GLYCOLAX) packet Take 17 g by mouth daily.   Yes Historical Provider, MD  predniSONE (DELTASONE) 10 MG tablet Take 10 mg by mouth daily with breakfast.    Yes Historical Provider, MD  sennosides-docusate sodium (SENOKOT-S) 8.6-50 MG tablet Take 1 tablet by mouth daily.   Yes Historical Provider, MD  Sodium Phosphates (RA SALINE ENEMA RE) Place 1 each rectally daily as needed (constipation).   Yes Historical Provider, MD  torsemide (DEMADEX) 20 MG tablet Take 20 mg by mouth 2 (two) times daily.    Yes Historical Provider, MD  HYDROcodone-acetaminophen (NORCO/VICODIN) 5-325 MG per tablet Take 1 tablet by mouth 3 (three) times daily as needed (pain). 09/20/14   Shanker Kristeen Mans, MD  metolazone (ZAROXOLYN) 2.5 MG tablet Take 2.5 mg by mouth once a week. Weekly on Wed    Historical Provider, MD  potassium chloride SA (K-DUR,KLOR-CON) 20 MEQ tablet Take 1 tablet (20 mEq total) by mouth daily. 09/20/14   Shanker Kristeen Mans, MD  traMADol (ULTRAM) 50 MG tablet Take 1 tablet (50 mg total) by mouth every 8 (eight) hours. 09/20/14   Shanker Kristeen Mans,  MD   BP 126/78 mmHg  Pulse 78  Temp(Src) 98.2 F (36.8 C) (Oral)  Resp 18  Ht 5' (1.524 m)  Wt 218 lb 11.1 oz (99.2 kg)  BMI 42.71 kg/m2  SpO2 98% Physical Exam  Constitutional: She appears ill. No distress.  Elderly and frail appearing  HENT:  Head: Normocephalic and atraumatic.  Eyes: Conjunctivae and EOM are normal.  Neck: Normal range of motion. Neck supple.  Cardiovascular: Normal rate, regular rhythm and normal heart sounds.   No murmur heard. Pulmonary/Chest: Effort normal and breath sounds normal.  Abdominal: Soft. Bowel sounds are normal. There is no tenderness. There is no rebound and no guarding.  Obese abdomen  Musculoskeletal: Normal range of motion. She exhibits no tenderness.  Neurological: She is alert. GCS eye subscore is 4. GCS verbal subscore is 4. GCS motor subscore is 6.  Skin: Skin is warm and dry. She is not diaphoretic.  Healing wound noted to the left ankle. Granular tissue noted at the base of the wound. No purulent drainage or surrounding erythema is noted.  Psychiatric: Her behavior is normal.  Nursing note and vitals reviewed.   ED Course  Procedures (including critical care time) Labs Review Labs Reviewed  COMPREHENSIVE METABOLIC PANEL - Abnormal; Notable for the following:    Sodium 130 (*)    Potassium >7.5 (*)    Chloride 99 (*)    CO2 18 (*)    Glucose, Bld 133 (*)    BUN 32 (*)    Creatinine, Ser 1.51 (*)    Total Protein 6.1 (*)    Albumin 3.4 (*)    GFR calc non Af Amer 32 (*)    GFR calc Af Amer 37 (*)    All other components within normal limits  URINALYSIS, ROUTINE W REFLEX MICROSCOPIC (NOT AT [facility]) - Abnormal; Notable for the following:    APPearance HAZY (*)    Glucose, UA 250 (*)    Hgb urine dipstick MODERATE (*)    Leukocytes, UA LARGE (*)    All other components within normal limits  URINE MICROSCOPIC-ADD ON - Abnormal; Notable for the following:    Squamous Epithelial / LPF MANY (*)    Bacteria, UA FEW (*)    All  other  components within normal limits  COMPREHENSIVE METABOLIC PANEL - Abnormal; Notable for the following:    Potassium 6.0 (*)    Chloride 100 (*)    BUN 28 (*)    Creatinine, Ser 1.50 (*)    Total Protein 5.9 (*)    Albumin 3.3 (*)    AST 45 (*)    Total Bilirubin 1.3 (*)    GFR calc non Af Amer 32 (*)    GFR calc Af Amer 37 (*)    All other components within normal limits  BASIC METABOLIC PANEL - Abnormal; Notable for the following:    Potassium 3.3 (*)    Chloride 97 (*)    BUN 29 (*)    Creatinine, Ser 1.48 (*)    GFR calc non Af Amer 33 (*)    GFR calc Af Amer 38 (*)    All other components within normal limits  GLUCOSE, CAPILLARY - Abnormal; Notable for the following:    Glucose-Capillary 100 (*)    All other components within normal limits  CBC WITH DIFFERENTIAL/PLATELET - Abnormal; Notable for the following:    MCV 103.9 (*)    RDW 16.5 (*)    All other components within normal limits  BRAIN NATRIURETIC PEPTIDE - Abnormal; Notable for the following:    B Natriuretic Peptide 332.2 (*)    All other components within normal limits  I-STAT CHEM 8, ED - Abnormal; Notable for the following:    Sodium 129 (*)    Potassium 7.4 (*)    BUN 42 (*)    Creatinine, Ser 1.50 (*)    Glucose, Bld 134 (*)    All other components within normal limits  GLUCOSE, CAPILLARY  GLUCOSE, CAPILLARY  PROTIME-INR  APTT  GLUCOSE, CAPILLARY  HEMOGLOBIN A1C    Imaging Review No results found. I, Theodosia Quay, personally reviewed and evaluated these images and lab results as part of my medical decision-making.   EKG Interpretation   Date/Time:  Thursday September 19 2014 17:05:06 EDT Ventricular Rate:  69 PR Interval:    QRS Duration: 130 QT Interval:  388 QTC Calculation: 416 R Axis:   -70 Text Interpretation:  Atrial fibrillation Nonspecific IVCD with LAD Left  ventricular hypertrophy Inferior infarct, old Anterior infarct, old No  significant change since last tracing Confirmed  by Debby Freiberg [phone])  on 09/19/2014 5:18:07 PM      MDM   Final diagnoses:  Hyperkalemia  History of type 2 diabetes mellitus     Patient is a 80 year old female past medical history of congestive heart failure, diverticulitis, left ankle fracture with residual chronic wound who presents today with known hyperkalemia that was found on preoperative labs were performed immediately prior to arrival. Patient was notably hyperkalemic with measurements above 7 here. Immediately the patient was given calcium gluconate with D50 and insulin and also albuterol nebulizers as well. Patient was given 30 ML's of Kayexalate per rectum as well. Discussed with nephrology who relates that given patient's renal function this is likely exogenous hyperkalemia. Related this information to the hospitalist and that there was no acute indication for dialysis at this point. Hospitalist is agreeable to admit the patient. Plan was discussed with the family who is in agreement as well. No obvious findings of be consistent with infection today. I agree with nephrology assessment that this is likely exogenous hyperkalemia as patient has 40 mEq of potassium to be given 3 times daily in her MAR. This will need to be addressed by  the inpatient team at the time of discharge to prevent this from happening in the future.    Theodosia Quay, MD 09/21/14 [id number]  Debby Freiberg, MD 09/23/14 2002

## 2014-09-19 NOTE — H&P (Signed)
[facility] History and Physical  [name] [name]:[id number] DOB: 1934-04-29 DOA: 09/19/2014  Referring physician: Theodosia Quay, MD PCP: Osborne Casco, MD   Chief Complaint: Hyperkalemia  HPI: [name] is a 80 y.o. female with history of congestive heart failure, diverticulitis, left ankle fracture with residual chronic wound presents to the ED with an abnormally high potassium of 7.4. Apparently the patient was scheduled for surgery and had PAT done which reveal presence of hyperkalemia. Patient has had slight increase in her creatinine level also. She is on three time a day supplementation of potassium 40 mEQ. Patient is asymtpomatic but she is confused which is baseline. The ED spoke with nephrology and they also agreed that the hyperkalemia is likely related to the intake of potassium along with the MOM enhancing absorption. Patient has no chest pain no palpitations. No nausea or vomiting noted. Patient has no diarrhea noted. Patient has no abdominal pain.  Review of Systems:  12 point ROS performed but unable to get as patient is a poor historian.  Past Medical History  Diagnosis Date  . HTN (hypertension)   . Diverticulosis   . Hemorrhoid   . Osteoarthritis   . Constipation, chronic   . H/O: hysterectomy   . Systolic and diastolic CHF, chronic     a. Echo 07/2011: Technically limited; inferior HK, inferolateral and possibly lateral wall HK, EF 40%, moderate LVE, aortic sclerosis without stenosis, mild MR, mild LAE, question RV dysfunction;   b.  Echo (04/2013): EF 50-55%, normal wall motion, moderate MR, mild to moderate LAE, PASP 37  . Female bladder prolapse     with prolapse uterus, cystocele, s/p TVH/BSO 12/2006  . Obesity (BMI 30-39.9)     wt. 94.1 kg 12/2010  . Moderate mitral regurgitation     a. Mod to Sev by echo 01/2009;  b. mild MR by echo 2013  . Atrial fibrillation 10/13/2011    coumadin Rx  . Iron deficiency anemia   . Pneumonia   . Shortness  of breath dyspnea   . GERD (gastroesophageal reflux disease)   . DM type 2 (diabetes mellitus, type 2) 07/25/2014  . Hyperlipidemia   . Ankle fracture   . Fracture, ankle Dec. 26, 2015    Left  . Fall at home March 2016  . Dysrhythmia   . History of hiatal hernia    Past Surgical History  Procedure Laterality Date  . Wrist surgery    . Vaginal hysterectomy,cystocele and prolapse  repair  NOV. 2008  . Abdominal hysterectomy    . Colonoscopy N/A 07/18/2013    Procedure: COLONOSCOPY;  Surgeon: Wonda Horner, MD;  Location: [facility] ENDOSCOPY;  Service: Endoscopy;  Laterality: N/A;  . Flexible sigmoidoscopy N/A 03/08/2014    Procedure: FLEXIBLE SIGMOIDOSCOPY;  Surgeon: Lear Ng, MD;  Location: [facility] ENDOSCOPY;  Service: Endoscopy;  Laterality: N/A;  need hoyer lift   Social History:  reports that she has never smoked. She has quit using smokeless tobacco. Her smokeless tobacco use included Snuff. She reports that she does not drink alcohol or use illicit drugs.  Allergies  Allergen Reactions  . Ativan [Lorazepam] Itching, Swelling and Rash    Throat, Tongue swelling  . Levaquin [Levofloxacin] Other (See Comments)    Per MAR  . Lexapro [Escitalopram Oxalate] Swelling  . Penicillins Other (See Comments)    Per MAR  . Tetanus-Diphtheria Toxoids Td Other (See Comments)    Per MAR  . Latex Hives and Rash  Family History  Problem Relation Age of Onset  . Diabetes Brother   . Hypertension Mother     deceased @ 74  . Leukemia Brother   . Cancer Brother   . Stroke Mother   . Diabetes Father     deceased in his 32's     Prior to Admission medications   Medication Sig Start Date End Date Taking? Authorizing Provider  albuterol (PROVENTIL) (2.5 MG/3ML) 0.083% nebulizer solution Take 2.5 mg by nebulization every 4 (four) hours as needed for wheezing or shortness of breath (For wheezing and shortness of breath).    Yes Historical Provider, MD  Amino Acids-Protein Hydrolys (FEEDING  SUPPLEMENT, PRO-STAT SUGAR FREE 64,) LIQD Take 30 mLs by mouth 2 (two) times daily.   Yes Historical Provider, MD  atorvastatin (LIPITOR) 40 MG tablet Take 40 mg by mouth daily.   Yes Historical Provider, MD  bisacodyl (DULCOLAX) 10 MG suppository Place 10 mg rectally daily as needed for moderate constipation (if constipation not relieved by MOM).   Yes Historical Provider, MD  carvedilol (COREG) 3.125 MG tablet Take 3.125 mg by mouth 2 (two) times daily with a meal. Hold if SBP <100   Yes Historical Provider, MD  ferrous sulfate 325 (65 FE) MG tablet Take 325 mg by mouth daily.   Yes Historical Provider, MD  HYDROcodone-acetaminophen (NORCO/VICODIN) 5-325 MG per tablet Take one tablet by mouth every 6 hours as needed for pain (control) Patient taking differently: Take 1 tablet by mouth 3 (three) times daily as needed (pain).  07/04/14  Yes Tiffany L Reed, DO  Insulin Glargine (TOUJEO SOLOSTAR) 300 UNIT/ML SOPN Inject 10 Units into the skin at bedtime.   Yes Historical Provider, MD  insulin lispro (HUMALOG KWIKPEN) 100 UNIT/ML KiwkPen Inject 0-10 Units into the skin 4 (four) times daily -  before meals and at bedtime. 150 or less= 0 units 151-200= 2 units 201-250= 4 units 251-300= 6 units 301-350= 8 units 351-400= 10 units   Yes Historical Provider, MD  lactulose (CHRONULAC) 10 GM/15ML solution Take 10 g by mouth daily.   Yes Historical Provider, MD  loratadine (CLARITIN) 10 MG tablet Take 10 mg by mouth daily.   Yes Historical Provider, MD  lubiprostone (AMITIZA) 24 MCG capsule Take 1 capsule (24 mcg total) by mouth 2 (two) times daily with a meal. Patient taking differently: Take 24 mcg by mouth 2 (two) times daily with a meal. For constipation 07/19/13  Yes Velvet Bathe, MD  pantoprazole (PROTONIX) 40 MG tablet Take 1 tablet (40 mg total) by mouth 2 (two) times daily. Patient taking differently: Take 40 mg by mouth daily. For GERD 08/10/13  Yes Barton Dubois, MD  polyethylene glycol [facility] /  GLYCOLAX) packet Take 17 g by mouth daily.   Yes Historical Provider, MD  potassium chloride SA (K-DUR,KLOR-CON) 20 MEQ tablet Take 40 mEq by mouth 3 (three) times daily.    Yes Historical Provider, MD  predniSONE (DELTASONE) 10 MG tablet Take 10 mg by mouth daily with breakfast.    Yes Historical Provider, MD  sennosides-docusate sodium (SENOKOT-S) 8.6-50 MG tablet Take 1 tablet by mouth daily.   Yes Historical Provider, MD  sodium chloride (OCEAN) 0.65 % SOLN nasal spray Place 1 spray into both nostrils 2 (two) times daily as needed for congestion.    Yes Historical Provider, MD  Sodium Phosphates (RA SALINE ENEMA RE) Place 1 each rectally daily as needed (constipation).   Yes Historical Provider, MD  torsemide (DEMADEX) 20 MG  tablet Take 20 mg by mouth 2 (two) times daily.    Yes Historical Provider, MD  traMADol (ULTRAM) 50 MG tablet Take one tablet by mouth every 8 hours as needed for moderate pain Patient taking differently: Take 50 mg by mouth every 8 (eight) hours.  11/26/13  Yes Tiffany L Reed, DO  Vitamin D, Ergocalciferol, (DRISDOL) 50000 UNITS CAPS capsule Take 50,000 Units by mouth every 7 (seven) days. On Tuesdays, for low vit D   Yes Historical Provider, MD  magnesium hydroxide (MILK OF MAGNESIA) 400 MG/5ML suspension Take 30 mLs by mouth daily as needed for mild constipation.    Historical Provider, MD  metolazone (ZAROXOLYN) 2.5 MG tablet Take 2.5 mg by mouth once a week. Weekly on Wed    Historical Provider, MD   Physical Exam: Filed Vitals:   09/19/14 1743 09/19/14 1749 09/19/14 1800 09/19/14 1830  BP:   144/94 137/89  Pulse:  56  76  Temp:      TempSrc:      Resp: 14 17    SpO2:  100% 100% 100%    Wt Readings from Last 3 Encounters:  09/19/14 97.206 kg (214 lb 4.8 oz)  09/13/14 98.884 kg (218 lb)  06/19/14 104.872 kg (231 lb 3.2 oz)    General:  Appears calm and comfortable Eyes: PERRL, normal lids, irises & conjunctiva ENT: grossly normal hearing, lips &  tongue Neck: no LAD, masses or thyromegaly Cardiovascular: RRR, no m/r/g Telemetry: SR, no arrhythmias  Respiratory: CTA bilaterally, no w/r/r. Abdomen: soft, ntnd Skin: no acute rash noted has boots on due to foot lesions Musculoskeletal: grossly normal tone BUE/BLE Psychiatric: unable to assess Neurologic: grossly non-focal moves all 4 extremities          Labs on Admission:  Basic Metabolic Panel:  Recent Labs Lab 09/19/14 1419 09/19/14 1626 09/19/14 1634  NA 133* 130* 129*  K 7.1* >7.5* 7.4*  CL 102 99* 102  CO2 22 18*  --   GLUCOSE 124* 133* 134*  BUN 31* 32* 42*  CREATININE 1.51* 1.51* 1.50*  CALCIUM 9.2 9.2  --    Liver Function Tests:  Recent Labs Lab 09/19/14 1626  AST 28  ALT 31  ALKPHOS 98  BILITOT 0.8  PROT 6.1*  ALBUMIN 3.4*   No results for input(s): LIPASE, AMYLASE in the last 168 hours. No results for input(s): AMMONIA in the last 168 hours. CBC:  Recent Labs Lab 09/19/14 1419 09/19/14 1634  WBC 8.8  --   HGB 12.6 14.6  HCT 40.4 43.0  MCV 104.9*  --   PLT 192  --    Cardiac Enzymes: No results for input(s): CKTOTAL, CKMB, CKMBINDEX, TROPONINI in the last 168 hours.  BNP (last 3 results) No results for input(s): BNP in the last 8760 hours.  ProBNP (last 3 results)  Recent Labs  11/28/13 1110  PROBNP 2108.0*    CBG:  Recent Labs Lab 09/19/14 1330  GLUCAP 135*    Radiological Exams on Admission: No results found.  EKG: Independently reviewed.   Assessment/Plan Principal Problem:   Hyperkalemia Active Problems:   Chronic diastolic CHF (congestive heart failure), NYHA class 4   AKI (acute kidney injury)   GERD (gastroesophageal reflux disease)   Chronic kidney disease (CKD), stage II (mild)   DM type 2 (diabetes mellitus, type 2)   Hyperlipidemia   1. Hyperkalemia -patient will be admitted to telemetry monitoring -she has received Insulin D50 and Kayexalate in the ED -she has  been on KLOR 1mEQ TID this will  be stopped for now -will hold magnesium also for now. currently not on ACE inhibitor right now -repeat labs in 2 hours and in am   2. CHF Class 4 -hold torsemide and zaroxolyn -will monitor for decompensation  3. AKI on CKD II -likely due to dehydration -will hold diuretics overnight -repeat labs in am  4. DM Type 2  -continue with insulin -will monitor FSBS and start on SSI as needed  5. Hyperlipidemia -on lipitor will be continued -check lipid panel  6. GERD -will hold antacid for now -PPI as needed     Code Status: FUll Code (must indicate code status--if unknown or must be presumed, indicate so) DVT Prophylaxis:heparin Family Communication: daughter (indicate person spoken with, if applicable, with phone number if by telephone) Disposition Plan: home (indicate anticipated LOS)  Time spent: 38min  KHAN,SAADAT A [facility] Pager [phone]

## 2014-09-19 NOTE — ED Notes (Signed)
Attempted Report 

## 2014-09-19 NOTE — Progress Notes (Addendum)
Anesthesia PAT Evaluation: Patient is a 80 year old female scheduled for aortogram with BLE runoff, possible LLE intervention on 09/25/14 by Dr. Bridgett Larsson. Anesthesia is posted as MAC. Case was posted in the hybrid OR due to her poor respiratory status.  History includes non-smoker, HTN, afib (no longer on warfarin due to GI bleed), chronic systolic and diastolic CHF, moderate mitral regurgitation, home O2 (2L/[place] continuous), iron deficiency anemia, SOB, LLL PNA 06/17/14, DM2, HLD, hiatal hernia, GERD, hysterectomy. She underwent flexible sigmoidoscopy on 02/26/14 with fentanyl and propofol.  BMI is consistent with obesity (39). Her activity has been limited for a while, but was transferring until 01/2014 when she fell and broke her left ankle. She sustained another fall in 04/2013 with a LLE laceration that hasn't healed. She is now bed/wheelchair bound and is full assist (lift) with transfers.    PCP is listed as Dr. Kelton Pillar. She is also followed at [facility] by Dr. Sheppard Coil and Clinton Gallant, NP. Cardiologist is Dr. Percival Spanish, last visit with Lyda Jester, PA-C on 5/11/6. She has also been seen at the [facility], last on 12/31/13.   Patient resides at [place]. She is here today with her daughter [name].    Updated med list is pending from [facility].   07/30/14 EKG: afib at 94 bpm, non-specific IVCD with LAD, LVH with repolarization abnormality, inferior infarct (old), anterior infarct (old).  04/27/13 Echo: Study Conclusions - Left ventricle: The cavity size was moderately dilated. Systolic function was normal. The estimated ejection fraction was in the range of 50% to 55%. Wall motion was normal; there were no regional wall motion abnormalities. - Mitral valve: Moderate regurgitation. - Left atrium: The atrium was mildly to moderately dilated. - Pulmonary arteries: PA peak pressure: 38mm Hg (S). (Previous echo on 07/20/11 during acute CHF admission showed LVEF 40%  with inferolateral hypokinesis, mild MR.)  07/30/14 CXR:  IMPRESSION: 1. No active cardiopulmonary disease. 2. Left basilar scarring. 3. Chronic cardiomegaly.  Patient evaluated at PAT. Patient was sleepy during most of my interview. Daughter reports patient is not used to being up in a wheelchair for so many hours and that lately she has not been wanting to eat much. CBG was 135. She was easily arousable. She would follow commands but primarily would just answer yes and no to questions. She has been on O2 per Dr. Sheppard Coil for ~ one year. She has had recurrent PNA this year, but currently feels at baseline. No cough. No SOB at rest. Her daughter think patient can lie flat, but she does sleep in a hospital bed at [facility]. Patient denied chest pain. Reportedly her weight has been going down (poor appetite suspected). On exam, she appears to be morbidly obese. Short neck, large tongue. Heart sounds actually sound regular. I could not appreciate a murmur, but her heart sounds were distant. She had a few crackles in the right base, but cleared with several deep breaths.  Lungs sounds were relatively clear.  She is wearing heel protector boots, but no real pitting edema noted.   Preoperative CBC noted prior to patient leaving PAT; however, shortly after she left PAT, lab called with a critical potassium of 7.1 (slight hemolysis). PAT RN was able to call daughter on her cell phone just before patient was getting on the Lucianne Lei back to [place], and she was instructed to take her mother to the ED instead. PAT RN called to notify the charge ED RN. I also notified [facility] RN Colletta Maryland.  Will revisit chart and discuss further with one of our anesthesiologists once more is known from her evaluation of hyperkalemia.   George [facility] Short Stay Center/Anesthesiology Phone [phone] 09/19/2014 5:07 PM  Addendum: Patient was admitted overnight by IM for hyperkalemia. They spoke with nephrology who  felt hyperkalemia was likely related to her oral KCL supplement along with magnesium enhancing absorption. She was treated with insulin D50 and kayexalate. Her ACEI, diuretics, KCl, and magnesium were held initially. Diuretics were resumed at discharge but her KCl dose was decreased. She was discharge back to [facility] this afternoon, potassium was down to 3.3. BNP 332 (pro-BNP levels in 2015 ranged from 2100-27000). A1C 7.8. Her EKG on 09/19/14 appeared stable showing afib at 69 bpm, non-specific IVCD with LAD, LVH, inferior infarct (old), anterior infarct (old). Repeat electrolytes in 2 days recommended at discharge.   Discussed above with anesthesiologist Dr. Smith Robert. If Dr. Bridgett Larsson plans to proceed then she will need a repeat BMET on arrival to ensure labs are acceptable. Also she will be evaluated by her surgeon and anesthesiologist to ensure she is not exeriencing any acute cardiopulmonary or CHF issues.  Hopefully procedure can be done under MAC as posted, although I did mention to her daughter yesterday that there is a possibility that [place] may be needed as well as overnight stay.  Definitive plan after evaluation on the day of her procedure.   George Hugh [facility] Short Stay Center/Anesthesiology Phone [phone] 09/20/2014 2:26 PM

## 2014-09-19 NOTE — ED Notes (Signed)
Admitting MD at bedside.

## 2014-09-19 NOTE — Progress Notes (Signed)
Spoke with Maggie at the lab regarding pt critical potassium level of 7.1 with slight hemolysis. Ebony Hail, [place], anesthesia made aware; PA advised that I notify pt immediately to have pt report to ED. Pt agreed to report to ED as advised and [place], Diplomatic Services operational officer, RN at ED was notified of pt condition and arrival. [place], [place], anesthesia agreed to make Dr. Bridgett Larsson aware.

## 2014-09-19 NOTE — Progress Notes (Signed)
   09/19/14 1344  OBSTRUCTIVE SLEEP APNEA  Have you ever been diagnosed with sleep apnea through a sleep study? No  Do you snore loudly (loud enough to be heard through closed doors)?  0  Do you often feel tired, fatigued, or sleepy during the daytime? 1  Has anyone observed you stop breathing during your sleep? 0  Do you have, or are you being treated for high blood pressure? 1  BMI more than 35 kg/m2? 1  Age over 78 years old? 1  Neck circumference greater than 40 cm/16 inches? 1  Gender: 0

## 2014-09-19 NOTE — ED Notes (Signed)
Pt belongings sent upstairs along with the wheelchair she brought from [place].

## 2014-09-19 NOTE — Pre-Procedure Instructions (Addendum)
[name]  09/19/2014      [id number] - [place], [place] [address] Phone: [phone] Fax: [phone]    Your procedure is scheduled on Aug 17  Report to [place] at 1000 A.M.  Call this number if you have problems the morning of surgery:  [phone]   Remember:  Do not eat food or drink liquids after midnight.  Take these medicines the morning of surgery with A SIP OF WATER: albuterol nebulizer if needed, carvedilol (Coreg), Hydrocodone-acetaminophen (Norco) or tramadol (Ultram) if needed, loratadine (Claritin), Lorazepam (ativan) if needed, pantoprazole (Protonix), prednisone   Stop taking Aspirin, Ibuprofen, BC's, Goody's, Herbal medications, Fish Oil, Aleve   Do not wear jewelry, make-up or nail polish.  Do not wear lotions, powders, or perfumes.  You may wear deodorant.  Do not shave 48 hours prior to surgery.  Men may shave face and neck.  Do not bring valuables to the hospital.  [facility] is not responsible for any belongings or valuables.  Contacts, dentures or bridgework may not be worn into surgery.  Leave your suitcase in the car.  After surgery it may be brought to your room.  For patients admitted to the hospital, discharge time will be determined by your treatment team.  Patients discharged the day of surgery will not be allowed to drive home.    Special instructions: [facility] - Preparing for Surgery  Before surgery, you can play an important role.  Because skin is not sterile, your skin needs to be as free of germs as possible.  You can reduce the number of germs on you skin by washing with CHG (chlorahexidine gluconate) soap before surgery.  CHG is an antiseptic cleaner which kills germs and bonds with the skin to continue killing germs even after washing.  Please DO NOT use if you have an allergy to CHG or antibacterial soaps.  If your  skin becomes reddened/irritated stop using the CHG and inform your nurse when you arrive at Short Stay.  Do not shave (including legs and underarms) for at least 48 hours prior to the first CHG shower.  You may shave your face.  Please follow these instructions carefully:   1.  Shower with CHG Soap the night before surgery and the    morning of Surgery.  2.  If you choose to wash your hair, wash your hair first as usual with your normal shampoo.  3.  After you shampoo, rinse your hair and body thoroughly to remove the   Shampoo.  4.  Use CHG as you would any other liquid soap.  You can apply chg directly  to the skin and wash gently with scrungie or a clean washcloth.  5.  Apply the CHG Soap to your body ONLY FROM THE NECK DOWN.   Do not use on open wounds or open sores.  Avoid contact with your eyes,  ears, mouth and genitals (private parts).  Wash genitals (private parts) with your normal soap.  6.  Wash thoroughly, paying special attention to the area where your surgery  will be performed.  7.  Thoroughly rinse your body with warm water from the neck down.  8.  DO NOT shower/wash with your normal soap after using and rinsing off  the CHG Soap.  9.  Pat yourself dry with a clean towel.  10.  Wear clean pajamas.            11.  Place clean sheets on your bed the night of your first shower and do not sleep with pets.  Day of Surgery  Do not apply any lotions/deoderants the morning of surgery.  Please wear clean clothes to the hospital/surgery center.     Please read over the following fact sheets that you were given. Pain Booklet, Coughing and Deep Breathing, MRSA Information and Surgical Site Infection Prevention      How to Manage Your Diabetes Before Surgery   Why is it important to control my blood sugar before and after surgery?   Improving blood sugar levels before and after surgery helps healing and can limit problems.  A way of improving blood sugar control is  eating a healthy diet by:  - Eating less sugar and carbohydrates  - Increasing activity/exercise  - Talk with your doctor about reaching your blood sugar goals  High blood sugars (greater than 180 mg/dL) can raise your risk of infections and slow down your recovery so you will need to focus on controlling your diabetes during the weeks before surgery.  Make sure that the doctor who takes care of your diabetes knows about your planned surgery including the date and location.  How do I manage my blood sugars before surgery?   Check your blood sugar at least 4 times a day, 2 days before surgery to make sure that they are not too high or low.   Check your blood sugar the morning of your surgery when you wake up and every 2               hours until you get to the Short-Stay unit.  If your blood sugar is less than 70 mg/dL, you will need to treat for low blood sugar by:  Treat a low blood sugar (less than 70 mg/dL) with 1/2 cup of clear juice (cranberry or apple), 4 glucose tablets, OR glucose gel.  Recheck blood sugar in 15 minutes after treatment (to make sure it is greater than 70 mg/dL).  If blood sugar is not greater than 70 mg/dL on re-check, call [phone] for further instructions.   Report your blood sugar to the Short-Stay nurse when you get to Short-Stay.  References:  [facility], 2007 "How to Manage your Diabetes Before and After Surgery".  What do I do about my diabetes medications?   Do not take oral diabetes medicines (pills) the morning of surgery.  THE NIGHT BEFORE SURGERY, take 8 units of Toujeo Insulin.        Do not take other diabetes injectables the day of surgery including Byetta, Victoza, Bydureon, and Trulicity.    If your CBG is greater than 220 mg/dL, you may take 1/2 of your sliding scale (correction) dose of insulin.

## 2014-09-19 NOTE — ED Notes (Signed)
Upon arrival to ED pt has bil boots on feet to aid in decreasing pressure ulcers

## 2014-09-19 NOTE — ED Notes (Signed)
Pt to have preop labs today and sent for eval of hyperkalemia; pt to have stent placed in her leg

## 2014-09-20 ENCOUNTER — Encounter (HOSPITAL_COMMUNITY): Payer: Self-pay | Admitting: *Deleted

## 2014-09-20 DIAGNOSIS — I5032 Chronic diastolic (congestive) heart failure: Secondary | ICD-10-CM | POA: Diagnosis not present

## 2014-09-20 DIAGNOSIS — I70242 Atherosclerosis of native arteries of left leg with ulceration of calf: Secondary | ICD-10-CM | POA: Diagnosis not present

## 2014-09-20 DIAGNOSIS — E875 Hyperkalemia: Secondary | ICD-10-CM | POA: Diagnosis not present

## 2014-09-20 DIAGNOSIS — E119 Type 2 diabetes mellitus without complications: Secondary | ICD-10-CM | POA: Diagnosis not present

## 2014-09-20 DIAGNOSIS — L8990 Pressure ulcer of unspecified site, unspecified stage: Secondary | ICD-10-CM | POA: Insufficient documentation

## 2014-09-20 DIAGNOSIS — N182 Chronic kidney disease, stage 2 (mild): Secondary | ICD-10-CM

## 2014-09-20 LAB — BASIC METABOLIC PANEL
ANION GAP: 14 (ref 5–15)
BUN: 29 mg/dL — AB (ref 6–20)
CALCIUM: 9.4 mg/dL (ref 8.9–10.3)
CO2: 25 mmol/L (ref 22–32)
Chloride: 97 mmol/L — ABNORMAL LOW (ref 101–111)
Creatinine, Ser: 1.48 mg/dL — ABNORMAL HIGH (ref 0.44–1.00)
GFR calc non Af Amer: 33 mL/min — ABNORMAL LOW (ref >60)
GFR, EST AFRICAN AMERICAN: 38 mL/min — AB (ref >60)
Glucose, Bld: 91 mg/dL (ref 65–99)
Potassium: 3.3 mmol/L — ABNORMAL LOW (ref 3.5–5.1)
Sodium: 136 mmol/L (ref 135–145)

## 2014-09-20 LAB — CBC WITH DIFFERENTIAL/PLATELET
BASOS ABS: 0.1 10*3/uL (ref 0.0–0.1)
Basophils Relative: 1 % (ref 0–1)
EOS ABS: 0.1 10*3/uL (ref 0.0–0.7)
Eosinophils Relative: 1 % (ref 0–5)
HEMATOCRIT: 40.2 % (ref 36.0–46.0)
Hemoglobin: 12.7 g/dL (ref 12.0–15.0)
Lymphocytes Relative: 20 % (ref 12–46)
Lymphs Abs: 1.6 10*3/uL (ref 0.7–4.0)
MCH: 32.8 pg (ref 26.0–34.0)
MCHC: 31.6 g/dL (ref 30.0–36.0)
MCV: 103.9 fL — ABNORMAL HIGH (ref 78.0–100.0)
Monocytes Absolute: 0.4 10*3/uL (ref 0.1–1.0)
Monocytes Relative: 5 % (ref 3–12)
Neutro Abs: 5.7 10*3/uL (ref 1.7–7.7)
Neutrophils Relative %: 73 % (ref 43–77)
Platelets: 202 10*3/uL (ref 150–400)
RBC: 3.87 MIL/uL (ref 3.87–5.11)
RDW: 16.5 % — AB (ref 11.5–15.5)
WBC: 7.9 10*3/uL (ref 4.0–10.5)

## 2014-09-20 LAB — COMPREHENSIVE METABOLIC PANEL
ALT: 29 U/L (ref 14–54)
AST: 45 U/L — ABNORMAL HIGH (ref 15–41)
Albumin: 3.3 g/dL — ABNORMAL LOW (ref 3.5–5.0)
Alkaline Phosphatase: 92 U/L (ref 38–126)
BUN: 28 mg/dL — ABNORMAL HIGH (ref 6–20)
CALCIUM: 9.5 mg/dL (ref 8.9–10.3)
CO2: UNDETERMINED mmol/L (ref 22–32)
CREATININE: 1.5 mg/dL — AB (ref 0.44–1.00)
Chloride: 100 mmol/L — ABNORMAL LOW (ref 101–111)
GFR calc Af Amer: 37 mL/min — ABNORMAL LOW (ref >60)
GFR calc non Af Amer: 32 mL/min — ABNORMAL LOW (ref >60)
Glucose, Bld: 91 mg/dL (ref 65–99)
Potassium: 6 mmol/L — ABNORMAL HIGH (ref 3.5–5.1)
Sodium: 136 mmol/L (ref 135–145)
Total Bilirubin: 1.3 mg/dL — ABNORMAL HIGH (ref 0.3–1.2)
Total Protein: 5.9 g/dL — ABNORMAL LOW (ref 6.5–8.1)

## 2014-09-20 LAB — GLUCOSE, CAPILLARY
GLUCOSE-CAPILLARY: 86 mg/dL (ref 65–99)
Glucose-Capillary: 78 mg/dL (ref 65–99)
Glucose-Capillary: 100 mg/dL — ABNORMAL HIGH (ref 65–99)
Glucose-Capillary: 98 mg/dL (ref 65–99)

## 2014-09-20 LAB — HEMOGLOBIN A1C
Hgb A1c MFr Bld: 7.8 % — ABNORMAL HIGH (ref 4.8–5.6)
Mean Plasma Glucose: 177 mg/dL

## 2014-09-20 LAB — PROTIME-INR
INR: 1.04 (ref 0.00–1.49)
PROTHROMBIN TIME: 13.8 s (ref 11.6–15.2)

## 2014-09-20 LAB — BRAIN NATRIURETIC PEPTIDE: B Natriuretic Peptide: 332.2 pg/mL — ABNORMAL HIGH (ref 0.0–100.0)

## 2014-09-20 LAB — APTT: aPTT: 29 seconds (ref 24–37)

## 2014-09-20 MED ORDER — HYDROCODONE-ACETAMINOPHEN 5-325 MG PO TABS: 1.0000 | ORAL_TABLET | Freq: Three times a day (TID) | ORAL | Status: DC | PRN

## 2014-09-20 MED ORDER — POTASSIUM CHLORIDE CRYS ER 20 MEQ PO TBCR: 20.0000 meq | EXTENDED_RELEASE_TABLET | Freq: Every day | ORAL | Status: DC

## 2014-09-20 MED ORDER — TRAMADOL HCL 50 MG PO TABS: 50.0000 mg | ORAL_TABLET | Freq: Three times a day (TID) | ORAL | Status: DC

## 2014-09-20 NOTE — Discharge Summary (Addendum)
PATIENT DETAILS Name: [name] Age: 80 y.o. Sex: female Date of Birth: 1934/02/24 MRN: [id number]. Admitting Physician: Allyne Gee, MD DQQ:[id number] Theda Sers, MD  Admit Date: 09/19/2014 Discharge date: 09/20/2014  Recommendations for Outpatient Follow-up:  1. Please check electrolytes in 2 days-to monitor potassium and creatinine  2. Admitted with hyperkalemia-on 40 mEq of potassium 3 times a day-now reduced to 20 mEq daily. Please continue to check electrolytes very frequently on this patient-at next one suggested in 2 days. 3. Please ensure follow-up with vascular surgery-Dr. Chen-patient is scheduled for a procedure on 8/17  PRIMARY DISCHARGE DIAGNOSIS:  Principal Problem:   Hyperkalemia Active Problems:   Chronic diastolic CHF (congestive heart failure), NYHA class 4   AKI (acute kidney injury)   GERD (gastroesophageal reflux disease)   Chronic kidney disease (CKD), stage II (mild)   DM type 2 (diabetes mellitus, type 2)   Hyperlipidemia   Pressure ulcer      PAST MEDICAL HISTORY: Past Medical History  Diagnosis Date  . HTN (hypertension)   . Diverticulosis   . Hemorrhoid   . Osteoarthritis   . Constipation, chronic   . H/O: hysterectomy   . Systolic and diastolic CHF, chronic     a. Echo 07/2011: Technically limited; inferior HK, inferolateral and possibly lateral wall HK, EF 40%, moderate LVE, aortic sclerosis without stenosis, mild MR, mild LAE, question RV dysfunction;   b.  Echo (04/2013): EF 50-55%, normal wall motion, moderate MR, mild to moderate LAE, PASP 37  . Female bladder prolapse     with prolapse uterus, cystocele, s/p TVH/BSO 12/2006  . Obesity (BMI 30-39.9)     wt. 94.1 kg 12/2010  . Moderate mitral regurgitation     a. Mod to Sev by echo 01/2009;  b. mild MR by echo 2013  . Atrial fibrillation 10/13/2011    coumadin Rx  . Iron deficiency anemia   . Pneumonia   . Shortness of breath dyspnea   . GERD (gastroesophageal reflux disease)     . DM type 2 (diabetes mellitus, type 2) 07/25/2014  . Hyperlipidemia   . Ankle fracture   . Fracture, ankle Dec. 26, 2015    Left  . Fall at home March 2016  . Dysrhythmia   . History of hiatal hernia     DISCHARGE MEDICATIONS: Current Discharge Medication List    CONTINUE these medications which have CHANGED   Details  HYDROcodone-acetaminophen (NORCO/VICODIN) 5-325 MG per tablet Take 1 tablet by mouth 3 (three) times daily as needed (pain). Qty: 15 tablet, Refills: 0    potassium chloride SA (K-DUR,KLOR-CON) 20 MEQ tablet Take 1 tablet (20 mEq total) by mouth daily.    traMADol (ULTRAM) 50 MG tablet Take 1 tablet (50 mg total) by mouth every 8 (eight) hours. Qty: 20 tablet, Refills: 5      CONTINUE these medications which have NOT CHANGED   Details  albuterol (PROVENTIL) (2.5 MG/3ML) 0.083% nebulizer solution Take 2.5 mg by nebulization every 4 (four) hours as needed for wheezing or shortness of breath (For wheezing and shortness of breath).     Amino Acids-Protein Hydrolys (FEEDING SUPPLEMENT, PRO-STAT SUGAR FREE 64,) LIQD Take 30 mLs by mouth 2 (two) times daily.    atorvastatin (LIPITOR) 40 MG tablet Take 40 mg by mouth daily.    bisacodyl (DULCOLAX) 10 MG suppository Place 10 mg rectally daily as needed for moderate constipation (if constipation not relieved by MOM).    carvedilol (COREG) 3.125 MG tablet Take  3.125 mg by mouth 2 (two) times daily with a meal. Hold if SBP <100    ferrous sulfate 325 (65 FE) MG tablet Take 325 mg by mouth daily.    Insulin Glargine (TOUJEO SOLOSTAR) 300 UNIT/ML SOPN Inject 10 Units into the skin at bedtime.    insulin lispro (HUMALOG KWIKPEN) 100 UNIT/ML KiwkPen Inject 0-10 Units into the skin 4 (four) times daily -  before meals and at bedtime. 150 or less= 0 units 151-200= 2 units 201-250= 4 units 251-300= 6 units 301-350= 8 units 351-400= 10 units    lactulose (CHRONULAC) 10 GM/15ML solution Take 10 g by mouth daily.     loratadine (CLARITIN) 10 MG tablet Take 10 mg by mouth daily.    lubiprostone (AMITIZA) 24 MCG capsule Take 1 capsule (24 mcg total) by mouth 2 (two) times daily with a meal. Qty: 30 capsule, Refills: 0    pantoprazole (PROTONIX) 40 MG tablet Take 1 tablet (40 mg total) by mouth 2 (two) times daily.    polyethylene glycol (MIRALAX / GLYCOLAX) packet Take 17 g by mouth daily.    predniSONE (DELTASONE) 10 MG tablet Take 10 mg by mouth daily with breakfast.     sennosides-docusate sodium (SENOKOT-S) 8.6-50 MG tablet Take 1 tablet by mouth daily.    Sodium Phosphates (RA SALINE ENEMA RE) Place 1 each rectally daily as needed (constipation).    torsemide (DEMADEX) 20 MG tablet Take 20 mg by mouth 2 (two) times daily.     metolazone (ZAROXOLYN) 2.5 MG tablet Take 2.5 mg by mouth once a week. Weekly on Wed      STOP taking these medications     sodium chloride (OCEAN) 0.65 % SOLN nasal spray      Vitamin D, Ergocalciferol, (DRISDOL) 50000 UNITS CAPS capsule      magnesium hydroxide (MILK OF MAGNESIA) 400 MG/5ML suspension         ALLERGIES:   Allergies  Allergen Reactions  . Ativan [Lorazepam] Itching, Swelling and Rash    Throat, Tongue swelling  . Levaquin [Levofloxacin] Other (See Comments)    Per MAR  . Lexapro [Escitalopram Oxalate] Swelling  . Penicillins Other (See Comments)    Per MAR  . Tetanus-Diphtheria Toxoids Td Other (See Comments)    Per MAR  . Latex Hives and Rash    BRIEF HPI:  See H&P, Labs, Consult and Test reports for all details in brief, patient is a 80 year old female with a history of chronic diastolic heart failure on diuretics and getting scheduled potassium 40 mEq 3 times a day-was found to have severe hyperkalemia on preoperative evaluation. She was admitted for further evaluation and treatment  CONSULTATIONS:   None  PERTINENT RADIOLOGIC STUDIES: No results found.   PERTINENT LAB RESULTS: CBC:  Recent Labs  09/19/14 1419  09/19/14 1634 09/20/14 0838  WBC 8.8  --  7.9  HGB 12.6 14.6 12.7  HCT 40.4 43.0 40.2  PLT 192  --  202   CMET CMP     Component Value Date/Time   NA 136 09/20/2014 0838   K 3.3* 09/20/2014 0838   CL 97* 09/20/2014 0838   CO2 25 09/20/2014 0838   GLUCOSE 91 09/20/2014 0838   BUN 29* 09/20/2014 0838   CREATININE 1.48* 09/20/2014 0838   CREATININE 1.62* 02/22/2014 1613   CALCIUM 9.4 09/20/2014 0838   PROT 5.9* 09/20/2014 0340   ALBUMIN 3.3* 09/20/2014 0340   AST 45* 09/20/2014 0340   ALT 29 09/20/2014 0340  ALKPHOS 92 09/20/2014 0340   BILITOT 1.3* 09/20/2014 0340   GFRNONAA 33* 09/20/2014 0838   GFRAA 38* 09/20/2014 0838    GFR Estimated Creatinine Clearance: 33.1 mL/min (by C-G formula based on Cr of 1.48). No results for input(s): LIPASE, AMYLASE in the last 72 hours. No results for input(s): CKTOTAL, CKMB, CKMBINDEX, TROPONINI in the last 72 hours. Invalid input(s): POCBNP No results for input(s): DDIMER in the last 72 hours.  Recent Labs  09/19/14 1419  HGBA1C 7.8*   No results for input(s): CHOL, HDL, LDLCALC, TRIG, CHOLHDL, LDLDIRECT in the last 72 hours. No results for input(s): TSH, T4TOTAL, T3FREE, THYROIDAB in the last 72 hours.  Invalid input(s): FREET3 No results for input(s): VITAMINB12, FOLATE, FERRITIN, TIBC, IRON, RETICCTPCT in the last 72 hours. Coags:  Recent Labs  09/20/14 0838  INR 1.04   Microbiology: Recent Results (from the past 240 hour(s))  Surgical pcr screen     Status: None   Collection Time: 09/19/14  2:24 PM  Result Value Ref Range Status   MRSA, PCR NEGATIVE NEGATIVE Final   Staphylococcus aureus NEGATIVE NEGATIVE Final    Comment:        The Xpert SA Assay (FDA approved for NASAL specimens in patients over 62 years of age), is one component of a comprehensive surveillance program.  Test performance has been validated by [facility] for patients greater than or equal to 27 year old. It is not intended to diagnose  infection nor to guide or monitor treatment.      BRIEF HOSPITAL COURSE:   Principal Problem:   Hyperkalemia: Admitted with severe hyperkalemia-no obvious medications her cause identified with the exception of potassium supplementation and use of MOM-per H&P-EDMD spoke with nephrology-was suggested that magnesium can enhance absorption of potassium and cause hyperkalemia. She is not on any ACE inhibitor. Patient was admitted and given IV insulin and Kayexalate. Her potassium this morning is 3.3. We will reduce her potassium supplementation to 20 mEq daily, and have her check electrolytes in the next 2 days for further close monitoring. She currently lives in a skilled nursing facility-and is stable to be discharged back therefore close monitoring.  Active Problems:   Chronic diastolic CHF (congestive heart failure), NYHA class 4: Clinically compensated, resume diuretics on discharge. Follow-up with cardiology at the next appointment.  Chronic kidney disease stage II- III: Creatinine only minimally elevated then usual baseline. Please continue to monitor electrolytes closely while on diuretics.  Type 2 diabetes: CBGs stable, continue Lantus and SSI.   GERD (gastroesophageal reflux disease): Continue PPI  Chronic hypoxic respiratory failure: Continue oxygen supplementation while at SNF  Peripheral Vascular Disease: Please ensure patient has follow-up with Dr. Lance Bosch apparently is scheduled for a aortogram with bilateral runoff and further intervention on 8/17.  Rest patient's medical issues were stable during this short hospital stay.   TODAY-DAY OF DISCHARGE:  Subjective:   [name] today is awake and alert. She denies any shortness of breath.  Objective:   Blood pressure 126/78, pulse 78, temperature 98.2 F (36.8 C), temperature source Oral, resp. rate 18, height 5' (1.524 m), weight 99.2 kg (218 lb 11.1 oz), SpO2 98 %. No intake or output data in the 24 hours ending  09/20/14 1038 Filed Weights   09/19/14 2110 09/20/14 0518  Weight: 99.338 kg (219 lb) 99.2 kg (218 lb 11.1 oz)    Exam Awake Alert, Oriented *3, No new F.N deficits, Normal affect Reese.AT,PERRAL Supple Neck,No JVD, No cervical lymphadenopathy appriciated.  Symmetrical Chest wall movement, Good air movement bilaterally, CTAB RRR,No Gallops,Rubs or new Murmurs, No Parasternal Heave +ve B.Sounds, Abd Soft, Non tender, No organomegaly appriciated, No rebound -guarding or rigidity.  DISCHARGE CONDITION: Stable  DISPOSITION: SNF  DISCHARGE INSTRUCTIONS:    Activity:  As tolerated with Full fall precautions use walker/cane & assistance as needed  Diet recommendation: Diabetic Diet Heart Healthy diet Fluid restriction 1.5 lit/day  Discharge Instructions    (HEART FAILURE PATIENTS) Call MD:  Anytime you have any of the following symptoms: 1) 3 pound weight gain in 24 hours or 5 pounds in 1 week 2) shortness of breath, with or without a dry hacking cough 3) swelling in the hands, feet or stomach 4) if you have to sleep on extra pillows at night in order to breathe.    Complete by:  As directed      Diet - low sodium heart healthy    Complete by:  As directed      Diet Carb Modified    Complete by:  As directed      Increase activity slowly    Complete by:  As directed            Follow-up Information    Follow up with Osborne Casco, MD. Schedule an appointment as soon as possible for a visit in 1 week.   Specialty:  Family Medicine   Contact information:   [address] [place] [address] [phone]       Follow up with Adele Barthel, MD. Schedule an appointment as soon as possible for a visit in 2 days.   Specialties:  Vascular Surgery, Cardiology   Contact information:   [address] [place] [address] [phone]       Total Time spent on discharge equals 25  minutes.  SignedOren Binet 09/20/2014 10:38 AM

## 2014-09-20 NOTE — Progress Notes (Signed)
Patient transferred from the ED via stretcher to room 3E07. Grand daughter at the bedside. Oriented and educated patient and family of being on the Heart Failure floor and room equipment. Patient alert and oriented to self and situation. Total care. Immediately after transferring patient to the hospital bed, patient was incontinent of bowel and bladder and was cleaned and sheets were changed. CMT notified of new admission and verified placement of telemetry leads.   During the night patient refused initial labs after arriving to the floor with two different phlebotomist at two different times. A third phlebotomist was successful in getting labs this morning.   Also When family was present they were not able to tell if patient can swallow meds so therefore medications were crushed and administered with applesauce. Patient did well with meds in applesauce and showed no signs or symptoms of aspiration but may benefit from a swallowing study.   Report given to day nurse and signing off at this time.

## 2014-09-20 NOTE — Plan of Care (Signed)
Problem: Phase I Progression Outcomes Goal: OOB as tolerated unless otherwise ordered Outcome: Not Applicable Date Met:  85/92/92 Patient is a total care Goal: Voiding-avoid urinary catheter unless indicated Outcome: Progressing Patient is incontinent of bowel and bladder but may benefit from foley catheter due to small wounds on bottom - stage II. To prevent them from breakdown any further.

## 2014-09-20 NOTE — Progress Notes (Signed)
80 year old female- resident of [facility]. Patient admitted yesterday from the SNF and per MD is stable for return to SNF today.  CSW spoke to Admissions at [facility] and bed remains available for patient. CSW met with patient and her daughter [name]. Both were agreeable to return to the facility. EMS transport arranged.  Nursing notified to call report.  Fl2 not required due to only overnight stay in the hospital  CSW signing off.  Lorie Phenix. Pauline Good, [place]

## 2014-09-20 NOTE — Progress Notes (Signed)
Attempted unsuccessfully to call and give report to receiving nurse at [facility] at [phone].  After receptionist answered, my call was transferred to [place], and was never answered.

## 2014-09-21 LAB — HEMOGLOBIN A1C
Hgb A1c MFr Bld: 7.4 % — ABNORMAL HIGH (ref 4.8–5.6)
MEAN PLASMA GLUCOSE: 166 mg/dL

## 2014-09-24 ENCOUNTER — Ambulatory Visit: Payer: Medicare Other | Admitting: Surgery

## 2014-09-24 MED ORDER — VANCOMYCIN HCL IN DEXTROSE 1-5 GM/200ML-% IV SOLN
1000.0000 mg | INTRAVENOUS | Status: AC
  Administered 2014-09-25: 1000 mg via INTRAVENOUS
  Filled 2014-09-24: qty 200

## 2014-09-24 MED ORDER — CHLORHEXIDINE GLUCONATE CLOTH 2 % EX PADS: 6.0000 | MEDICATED_PAD | Freq: Once | CUTANEOUS | Status: DC

## 2014-09-24 MED ORDER — SODIUM CHLORIDE 0.9 % IV SOLN
INTRAVENOUS | Status: DC
Start: 2014-09-25 — End: 2014-09-25
  Administered 2014-09-25 (×2): via INTRAVENOUS

## 2014-09-25 ENCOUNTER — Encounter (HOSPITAL_COMMUNITY)
Admission: RE | Disposition: A | Discharge status: Home / Self Care | Payer: Self-pay | Source: Ambulatory Visit | Attending: Vascular Surgery

## 2014-09-25 ENCOUNTER — Other Ambulatory Visit: Payer: Medicare Other | Admitting: *Deleted

## 2014-09-25 ENCOUNTER — Ambulatory Visit (HOSPITAL_COMMUNITY): Payer: Medicare Other | Admitting: Anesthesiology

## 2014-09-25 ENCOUNTER — Encounter (HOSPITAL_COMMUNITY): Payer: Self-pay | Admitting: *Deleted

## 2014-09-25 ENCOUNTER — Ambulatory Visit (HOSPITAL_COMMUNITY)
Admission: RE | Admit: 2014-09-25 | Discharge: 2014-09-25 | Disposition: A | Discharge status: Home / Self Care | Payer: Medicare Other | Source: Ambulatory Visit | Attending: Vascular Surgery | Admitting: Vascular Surgery

## 2014-09-25 ENCOUNTER — Other Ambulatory Visit: Payer: Self-pay | Admitting: *Deleted

## 2014-09-25 ENCOUNTER — Ambulatory Visit (HOSPITAL_COMMUNITY): Payer: Medicare Other | Admitting: Vascular Surgery

## 2014-09-25 DIAGNOSIS — I129 Hypertensive chronic kidney disease with stage 1 through stage 4 chronic kidney disease, or unspecified chronic kidney disease: Secondary | ICD-10-CM | POA: Insufficient documentation

## 2014-09-25 DIAGNOSIS — E1122 Type 2 diabetes mellitus with diabetic chronic kidney disease: Secondary | ICD-10-CM | POA: Diagnosis not present

## 2014-09-25 DIAGNOSIS — I739 Peripheral vascular disease, unspecified: Secondary | ICD-10-CM

## 2014-09-25 DIAGNOSIS — N189 Chronic kidney disease, unspecified: Secondary | ICD-10-CM | POA: Insufficient documentation

## 2014-09-25 DIAGNOSIS — Z9862 Peripheral vascular angioplasty status: Secondary | ICD-10-CM

## 2014-09-25 DIAGNOSIS — I70242 Atherosclerosis of native arteries of left leg with ulceration of calf: Secondary | ICD-10-CM | POA: Insufficient documentation

## 2014-09-25 DIAGNOSIS — Z9981 Dependence on supplemental oxygen: Secondary | ICD-10-CM | POA: Insufficient documentation

## 2014-09-25 DIAGNOSIS — L97223 Non-pressure chronic ulcer of left calf with necrosis of muscle: Secondary | ICD-10-CM | POA: Diagnosis not present

## 2014-09-25 DIAGNOSIS — Z794 Long term (current) use of insulin: Secondary | ICD-10-CM | POA: Insufficient documentation

## 2014-09-25 DIAGNOSIS — Z7901 Long term (current) use of anticoagulants: Secondary | ICD-10-CM | POA: Insufficient documentation

## 2014-09-25 DIAGNOSIS — Z79899 Other long term (current) drug therapy: Secondary | ICD-10-CM | POA: Insufficient documentation

## 2014-09-25 DIAGNOSIS — E785 Hyperlipidemia, unspecified: Secondary | ICD-10-CM | POA: Insufficient documentation

## 2014-09-25 DIAGNOSIS — K219 Gastro-esophageal reflux disease without esophagitis: Secondary | ICD-10-CM | POA: Insufficient documentation

## 2014-09-25 DIAGNOSIS — I5042 Chronic combined systolic (congestive) and diastolic (congestive) heart failure: Secondary | ICD-10-CM | POA: Insufficient documentation

## 2014-09-25 DIAGNOSIS — Z6839 Body mass index (BMI) 39.0-39.9, adult: Secondary | ICD-10-CM | POA: Insufficient documentation

## 2014-09-25 DIAGNOSIS — G4730 Sleep apnea, unspecified: Secondary | ICD-10-CM | POA: Insufficient documentation

## 2014-09-25 DIAGNOSIS — I4891 Unspecified atrial fibrillation: Secondary | ICD-10-CM | POA: Insufficient documentation

## 2014-09-25 DIAGNOSIS — Z7952 Long term (current) use of systemic steroids: Secondary | ICD-10-CM | POA: Insufficient documentation

## 2014-09-25 HISTORY — PX: AORTOGRAM: SHX6300

## 2014-09-25 LAB — POCT I-STAT 4, (NA,K, GLUC, HGB,HCT)
GLUCOSE: 126 mg/dL — AB (ref 65–99)
Glucose, Bld: 126 mg/dL — ABNORMAL HIGH (ref 65–99)
HCT: 38 % (ref 36.0–46.0)
HEMATOCRIT: 38 % (ref 36.0–46.0)
Hemoglobin: 12.9 g/dL (ref 12.0–15.0)
Hemoglobin: 12.9 g/dL (ref 12.0–15.0)
POTASSIUM: 5.7 mmol/L — AB (ref 3.5–5.1)
Potassium: 5.8 mmol/L — ABNORMAL HIGH (ref 3.5–5.1)
Sodium: 139 mmol/L (ref 135–145)
Sodium: 139 mmol/L (ref 135–145)

## 2014-09-25 LAB — GLUCOSE, CAPILLARY
GLUCOSE-CAPILLARY: 120 mg/dL — AB (ref 65–99)
GLUCOSE-CAPILLARY: 149 mg/dL — AB (ref 65–99)

## 2014-09-25 SURGERY — AORTOGRAM
Anesthesia: Monitor Anesthesia Care | Site: Leg Upper | Laterality: Left

## 2014-09-25 MED ORDER — CLOPIDOGREL BISULFATE 300 MG PO TABS
300.0000 mg | ORAL_TABLET | Freq: Once | ORAL | Status: DC
  Filled 2014-09-25: qty 1

## 2014-09-25 MED ORDER — IODIXANOL 320 MG/ML IV SOLN
INTRAVENOUS | Status: DC | PRN
  Administered 2014-09-25: 5 mL via INTRAVENOUS

## 2014-09-25 MED ORDER — FENTANYL CITRATE (PF) 100 MCG/2ML IJ SOLN
INTRAMUSCULAR | Status: DC | PRN
  Administered 2014-09-25 (×4): 25 ug via INTRAVENOUS

## 2014-09-25 MED ORDER — SODIUM CHLORIDE 0.9 % IR SOLN
Status: DC | PRN
  Administered 2014-09-25: 500 mL

## 2014-09-25 MED ORDER — PHENYLEPHRINE HCL 10 MG/ML IJ SOLN
INTRAMUSCULAR | Status: DC | PRN
  Administered 2014-09-25: 80 ug via INTRAVENOUS

## 2014-09-25 MED ORDER — LIDOCAINE HCL (PF) 1 % IJ SOLN
INTRAMUSCULAR | Status: DC | PRN
  Administered 2014-09-25: 8 mL

## 2014-09-25 MED ORDER — LIDOCAINE HCL (PF) 1 % IJ SOLN
INTRAMUSCULAR | Status: AC
  Filled 2014-09-25: qty 30

## 2014-09-25 MED ORDER — ONDANSETRON HCL 4 MG/2ML IJ SOLN: 4.0000 mg | Freq: Once | INTRAMUSCULAR | Status: DC | PRN

## 2014-09-25 MED ORDER — FENTANYL CITRATE (PF) 250 MCG/5ML IJ SOLN
INTRAMUSCULAR | Status: AC
  Filled 2014-09-25: qty 5

## 2014-09-25 MED ORDER — CLOPIDOGREL BISULFATE 75 MG PO TABS: 75.0000 mg | ORAL_TABLET | Freq: Every day | ORAL | Status: AC

## 2014-09-25 MED ORDER — IODIXANOL 320 MG/ML IV SOLN
INTRAVENOUS | Status: DC | PRN
  Administered 2014-09-25: 50 mL via INTRAVENOUS

## 2014-09-25 MED ORDER — HEPARIN SODIUM (PORCINE) 1000 UNIT/ML IJ SOLN
INTRAMUSCULAR | Status: DC | PRN
Start: 2014-09-25 — End: 2014-09-25
  Administered 2014-09-25: 10000 [IU] via INTRAVENOUS

## 2014-09-25 MED ORDER — PROTAMINE SULFATE 10 MG/ML IV SOLN
INTRAVENOUS | Status: DC | PRN
  Administered 2014-09-25 (×3): 10 mg via INTRAVENOUS
  Administered 2014-09-25: 20 mg via INTRAVENOUS

## 2014-09-25 MED ORDER — PROPOFOL 10 MG/ML IV BOLUS
INTRAVENOUS | Status: DC | PRN
  Administered 2014-09-25: 10 mg via INTRAVENOUS
  Administered 2014-09-25: 20 mg via INTRAVENOUS
  Administered 2014-09-25: 10 mg via INTRAVENOUS
  Administered 2014-09-25: 20 mg via INTRAVENOUS
  Administered 2014-09-25: 10 mg via INTRAVENOUS

## 2014-09-25 MED ORDER — PROPOFOL 10 MG/ML IV BOLUS
INTRAVENOUS | Status: AC
  Filled 2014-09-25: qty 20

## 2014-09-25 MED ORDER — FENTANYL CITRATE (PF) 100 MCG/2ML IJ SOLN: 25.0000 ug | INTRAMUSCULAR | Status: DC | PRN

## 2014-09-25 MED ORDER — LIDOCAINE HCL (CARDIAC) 20 MG/ML IV SOLN
INTRAVENOUS | Status: DC | PRN
  Administered 2014-09-25: 30 mg via INTRAVENOUS

## 2014-09-25 SURGICAL SUPPLY — 57 items
ARMADA 18 PTA CATHETER 2.5MM X 20MM X 150CM ×1 IMPLANT
ARMADA 35 PTA CATHETER 5.0MM X 40MM X 135CM ×1 IMPLANT
BAG BANDED W/RUBBER/TAPE 36X54 (MISCELLANEOUS) ×1 IMPLANT
BAG EQP BAND 135X91 W/RBR TAPE (MISCELLANEOUS) ×1
CANISTER SUCTION 2500CC (MISCELLANEOUS) ×1 IMPLANT
CATH OMNI FLUSH .035X70CM (CATHETERS) ×1 IMPLANT
CATH SOFT-VU ST 4F 90CM (CATHETERS) ×1 IMPLANT
CLIP TI MEDIUM 6 (CLIP) ×1 IMPLANT
CLIP TI WIDE RED SMALL 6 (CLIP) ×1 IMPLANT
COVER DOME SNAP 22 D (MISCELLANEOUS) ×1 IMPLANT
COVER PROBE W GEL 5X96 (DRAPES) IMPLANT
DECANTER SPIKE VIAL GLASS SM (MISCELLANEOUS) ×1 IMPLANT
DEVICE TORQUE KENDALL .025-038 (MISCELLANEOUS) ×1 IMPLANT
DRAIN PENROSE 1/2X12 LTX STRL (WOUND CARE) IMPLANT
DRAPE ORTHO SPLIT 77X108 STRL (DRAPES) ×2
DRAPE SURG ORHT 6 SPLT 77X108 (DRAPES) IMPLANT
ELECT REM PT RETURN 9FT ADLT (ELECTROSURGICAL) ×2
ELECTRODE REM PT RTRN 9FT ADLT (ELECTROSURGICAL) ×1 IMPLANT
GLOVE BIO SURGEON STRL SZ7 (GLOVE) ×1 IMPLANT
GLOVE BIOGEL PI IND STRL 6.5 (GLOVE) IMPLANT
GLOVE BIOGEL PI IND STRL 7.5 (GLOVE) ×1 IMPLANT
GLOVE BIOGEL PI INDICATOR 6.5 (GLOVE) ×1
GLOVE BIOGEL PI INDICATOR 7.5 (GLOVE) ×1
GLOVE SURG SS PI 6.5 STRL IVOR (GLOVE) ×1 IMPLANT
GLOVE SURG SS PI 7.0 STRL IVOR (GLOVE) ×1 IMPLANT
GOWN STRL REUS W/ TWL LRG LVL3 (GOWN DISPOSABLE) ×3 IMPLANT
GOWN STRL REUS W/TWL LRG LVL3 (GOWN DISPOSABLE) ×4
GUIDEWIRE ANGLED .035X150CM (WIRE) ×1 IMPLANT
KIT BASIN OR (CUSTOM PROCEDURE TRAY) ×2 IMPLANT
KIT ENCORE 26 ADVANTAGE (KITS) ×1 IMPLANT
KIT ROOM TURNOVER OR (KITS) ×2 IMPLANT
LIQUID BAND (GAUZE/BANDAGES/DRESSINGS) ×2 IMPLANT
NDL PERC 18GX7CM (NEEDLE) IMPLANT
NEEDLE PERC 18GX7CM (NEEDLE) ×2 IMPLANT
NS IRRIG 1000ML POUR BTL (IV SOLUTION) ×2 IMPLANT
PACK CV ACCESS (CUSTOM PROCEDURE TRAY) ×2 IMPLANT
PAD ARMBOARD 7.5X6 YLW CONV (MISCELLANEOUS) ×4 IMPLANT
PROTECTION STATION PRESSURIZED (MISCELLANEOUS) ×2
SET MICROPUNCTURE 5F STIFF (MISCELLANEOUS) ×1 IMPLANT
SHEATH AVANTI 11CM 5FR (MISCELLANEOUS) ×1 IMPLANT
SHEATH FLEXOR ANSEL 1 7F 45CM (SHEATH) ×1 IMPLANT
SPONGE GAUZE 4X4 12PLY STER LF (GAUZE/BANDAGES/DRESSINGS) ×1 IMPLANT
SPONGE SURGIFOAM ABS GEL 100 (HEMOSTASIS) IMPLANT
STATION PROTECTION PRESSURIZED (MISCELLANEOUS) IMPLANT
SUT MNCRL AB 4-0 PS2 18 (SUTURE) ×1 IMPLANT
SUT PROLENE 6 0 BV (SUTURE) IMPLANT
SUT PROLENE 7 0 BV 1 (SUTURE) ×1 IMPLANT
SUT VIC AB 3-0 SH 27 (SUTURE)
SUT VIC AB 3-0 SH 27X BRD (SUTURE) ×1 IMPLANT
TAPE CLOTH SURG 4X10 WHT LF (GAUZE/BANDAGES/DRESSINGS) ×1 IMPLANT
TAPE VIPERTRACK RADIOPAQ 30X (MISCELLANEOUS) IMPLANT
TAPE VIPERTRACK RADIOPAQUE (MISCELLANEOUS) ×2
UNDERPAD 30X30 INCONTINENT (UNDERPADS AND DIAPERS) ×1 IMPLANT
WATER STERILE IRR 1000ML POUR (IV SOLUTION) ×1 IMPLANT
WIRE BENTSON .035X145CM (WIRE) ×1 IMPLANT
WIRE ROSEN-J .035X260CM (WIRE) ×1 IMPLANT
WIRE SPARTACORE .014X300CM (WIRE) ×1 IMPLANT

## 2014-09-25 NOTE — Op Note (Signed)
OPERATIVE NOTE   PROCEDURE: 1.  right common femoral artery cannulation under ultrasound guidance 2.  Placement of catheter in aorta 3.  Aortogram with carbon dioxide 4.  Third order arterial selection 5.  Left leg runoff 6.  Angioplasty left superficial femoral artery (5 mm x 40 mm) 7.  Angioplasty left peroneal artery (2.5 x 20 mm)  PRE-OPERATIVE DIAGNOSIS: left calf non-healing ulcer  POST-OPERATIVE DIAGNOSIS: same as above   SURGEON: Leonides Sake, MD  ANESTHESIA: conscious sedation  ESTIMATED BLOOD LOSS: 50 cc  CONTRAST: 55 cc IV contrast  FINDING(S):  Aorta: patent  Superior mesenteric artery: not well visualized Celiac artery: not well visualized   Right Left  RA Not well visualized Not well visualized  CIA Patent Patent  EIA Patent Patent  IIA Patent Patent  CFA Patent Patent  SFA  Patent with >75% stenosis distally: <30% residual after angioplasty  PFA  Patent  Pop  Patent  Trif  Patent  AT  Patent proximally, occludes shortly after take off  Pero  Patent with >90% stenosis shortly after take-off, resolved after serial angioplasty, dominant runoff  PT  Patent, diminunitive   SPECIMEN(S):  none  INDICATIONS:   BERNARDINE LANGWORTHY is a 80 y.o. female who presents with non-healing left lateral ulcer down to muscle.  The patient's arterial duplex demonstrated monophasic flow from popliteal down.  I recommended: aortogram, bilateral leg runoff and possible left leg intervention.  Due to chronic renal disease, I elected to proceed with only imaging of the left leg and aorta.  I also elected to proceed with use of carbon dioxide with IV contrast.  I discussed with the patient the nature of angiographic procedures, especially the limited patencies of any endovascular intervention.  The patient is aware of that the risks of an angiographic procedure include but are not limited to: bleeding, infection, access site complications, renal failure, embolization, rupture of vessel,  dissection, possible need for emergent surgical intervention, possible need for surgical procedures to treat the patient's pathology, and stroke and death.  The patient is aware of the risks and agrees to proceed.  DESCRIPTION: After full informed consent was obtained from the patient, the patient was brought back to the angiography suite.  The patient was placed supine upon the angiography table and connected to monitoring equipment.  The patient was then given conscious sedation, the amounts of which are documented in the patient's chart.  The patient was prepped and drape in the standard fashion for an angiographic procedure.  At this point, attention was turned to the right groin.  Under ultrasound guidance, the subcutaneous tissue surrounding the right common femoral artery was anesthesized with 1% lidocaine with epinephrine.  The artery was then cannulated with a micropuncture needle.  The microwire was advanced into the iliac arterial system.  The needle was exchanged for a microsheath, which was loaded into the common femoral artery over the wire.  The microwire was exchanged for a [facility] wire which was advanced into the aorta.  The microsheath was then exchanged for a 5-Fr sheath which was loaded into the common femoral artery.  The Omniflush catheter was then loaded over the wire up to the level of L1.  The catheter was connected to the carbon dioxide circuit.  After de-airring and de-clotting the circuit, a power injector aortogram was completed.  I then pulled the catheter into the distal aorta.  A bilateral pelvic injection was completed.  While the flow was slow, there was no frank  disease here.  The [facility] wire was replaced in the catheter, and using the [place] and Omniflush catheter, the left common iliac artery was selected.  The wire tracked into the left internal iliac artery, so I exchanged it for a glidewire.  Using this combination, I was able to get into the left external iliac artery.  I  did another carbon dioxide injection to image the proximal thigh.  I then selected the right superficial femoral artery and advanced the catheter into the left superficial femoral artery.  I did another couple of injections down to the level of the proximal tibial arteries.  At this point, there was obvious distal superficial femoral artery disease and possible tibial artery disease.  At this point, I changed over to hand injections with IV contrast.  I reimaged the distal femoral artery and tibial arteries all the way down to the ankle.  Unfortunately, I could not image the foot due the distal extent of this angiography system.  Based on the images, I felt intervention on the distal left superficial femoral artery and peroneal artery.  The wire was exchanged for a Rosen wire.  The patient's right femoral sheath was exchanged for a 7-Fr Ansel sheath, which was lodged in the left superficial femoral artery.  The dilator was removed.  The patient was given 10000 units of Heparin intravenously, which was a therapeutic bolus.  I exchanged the wire for a 0.014" Spartacore wire which I steered into the peroneal artery.  I selected a 5 mm x 40 mm angioplasty balloon for the distal superficial femoral artery lesion.  It was inflated at 8 atm for 2 minutes.  Completion injection demonstrated <30% residual stenosis.  I then selected a 2.5 mm x 20 mm angioplasty balloon for the proximal peroneal artery.  The balloon was centered on this artery and inflated to 2 atm for 2 minutes.  The completion imaging demonstrated >30% residual stenosis.  I replaced the previous balloon and reinflated at 2 atm for 4 minutes.  I deflated the balloon and removed it.  The completion imaging demonstrated resolution of the proximal peroneal stenosis and greatly improved flow to the left calf.  At this point, the wire was removed.  I pulled the sheath back into the right external iliac artery.  The sheath was aspirated.  No clots were present  and the sheath was reloaded with heparinized saline.  I gave the patient a total of 80 mg of Protamine to fully reverse the anticoagulation.  ACT demonstrated reversal of anticoagulation.  The sheath was removed and pressure held for 20 minutes.  A sterile bandage was applied to this cannulation site.  The patient will be loaded on 300 mg of Plavix and continued on 75 mg daily for one month.  This patient has previously had significant GI bleeding from diverticulosis, so I would not continue the antiplatelet agent past one month.   COMPLICATIONS: none  CONDITION: stable   Adele Barthel, MD [facility] Office: [phone] Pager: [phone]  09/25/2014, 1:48 PM

## 2014-09-25 NOTE — Interval H&P Note (Signed)
[facility]  History and Physical Update  The patient was interviewed and re-examined.  The patient's previous History and Physical has been reviewed and is unchanged from my consult.  There is no change in the plan of care: Aortogram, bilateral leg runoff, and possible intervention.  I discussed with the patient the nature of angiographic procedures, especially the limited patencies of any endovascular intervention.  The patient is aware of that the risks of an angiographic procedure include but are not limited to: bleeding, infection, access site complications, renal failure, embolization, rupture of vessel, dissection, possible need for emergent surgical intervention, possible need for surgical procedures to treat the patient's pathology, anaphylactic reaction to contrast, and stroke and death.  The patient is aware of the risks and agrees to proceed. Adele Barthel, MD [facility] Office: [phone] Pager: [phone]  09/25/2014, 10:38 AM

## 2014-09-25 NOTE — Anesthesia Postprocedure Evaluation (Signed)
  Anesthesia Post-op Note  Patient: [name]  Procedure(s) Performed: Procedure(s) (LRB): AORTOGRAM WITH LEFT LEG LOWER EXTREMIY RUNOFF; ANGIOPLASTY OF TIBIAL AND PERONEAL ARTERY (Left)  Patient Location: PACU  Anesthesia Type: MAC  Level of Consciousness: awake and alert   Airway and Oxygen Therapy: Patient Spontanous Breathing  Post-op Pain: mild  Post-op Assessment: Post-op Vital signs reviewed, Patient's Cardiovascular Status Stable, Respiratory Function Stable, Patent Airway and No signs of Nausea or vomiting  Last Vitals:  Filed Vitals:   09/25/14 1350  BP: 134/77  Pulse: 82  Temp: 36.7 C  Resp: 16    Post-op Vital Signs: stable   Complications: No apparent anesthesia complications

## 2014-09-25 NOTE — Transfer of Care (Signed)
Immediate Anesthesia Transfer of Care Note  Patient: [name]  Procedure(s) Performed: Procedure(s): AORTOGRAM WITH LEFT LEG LOWER EXTREMIY RUNOFF; ANGIOPLASTY OF TIBIAL AND PERONEAL ARTERY (Left)  Patient Location: PACU  Anesthesia Type:MAC  Level of Consciousness: awake, alert  and confused (hx dementia oriented to self)  Airway & Oxygen Therapy: Patient Spontanous Breathing  Post-op Assessment: Report given to RN, Post -op Vital signs reviewed and stable and Patient moving all extremities  Post vital signs: Reviewed and stable  Last Vitals:  Filed Vitals:   09/25/14 1014  BP: 132/91  Pulse: 84  Temp: 36.1 C  Resp: 16    Complications: No apparent anesthesia complications

## 2014-09-25 NOTE — Anesthesia Preprocedure Evaluation (Addendum)
Anesthesia Evaluation  Patient identified by MRN, date of birth, ID band Patient awake    Reviewed: Allergy & Precautions, NPO status , Patient's Chart, lab work & pertinent test results  Airway Mallampati: III   Neck ROM: Limited  Mouth opening: Limited Mouth Opening  Dental  (+) Missing, Edentulous Upper, Chipped, Dental Advidsory Given,    Pulmonary shortness of breath, sleep apnea and Oxygen sleep apnea ,  Hx of resp failure breath sounds clear to auscultation        Cardiovascular hypertension, On Medications + Peripheral Vascular Disease, +CHF and + DOE + dysrhythmias Atrial Fibrillation + Valvular Problems/Murmurs MR Rhythm:Regular Rate:Normal   most recent echo EF 45% mild MR    Neuro/Psych PSYCHIATRIC DISORDERS    GI/Hepatic hiatal hernia, GERD-  Medicated and Controlled,  Endo/Other  diabetes, Type 2Morbid obesity  Renal/GU Renal InsufficiencyRenal disease     Musculoskeletal  (+) Arthritis -,   Abdominal (+) + obese,   Peds  Hematology  (+) anemia ,   Anesthesia Other Findings   Reproductive/Obstetrics                            Anesthesia Physical  Anesthesia Plan  ASA: IV  Anesthesia Plan: MAC   Post-op Pain Management:    Induction: Intravenous  Airway Management Planned: Natural Airway and Nasal Cannula  Additional Equipment:   Intra-op Plan:   Post-operative Plan:   Informed Consent: I have reviewed the patients History and Physical, chart, labs and discussed the procedure including the risks, benefits and alternatives for the proposed anesthesia with the patient or authorized representative who has indicated his/her understanding and acceptance.   Dental advisory given and Dental Advisory Given  Plan Discussed with: CRNA, Surgeon and Anesthesiologist  Anesthesia Plan Comments:        Anesthesia Quick Evaluation

## 2014-09-25 NOTE — Anesthesia Procedure Notes (Signed)
Procedure Name: MAC Date/Time: 09/25/2014 11:20 AM Performed by: Neldon Newport Pre-anesthesia Checklist: Timeout performed, Patient being monitored, Suction available, Emergency Drugs available and Patient identified Patient Re-evaluated:Patient Re-evaluated prior to inductionOxygen Delivery Method: Simple face mask Placement Confirmation: positive ETCO2

## 2014-09-25 NOTE — H&P (View-Only) (Signed)
Referred by:  Kelton Pillar, MD [address] [place], [place] [address]  Reason for referral: non-healing L lateral calf wound  History of Present Illness  [name] is a 80 y.o. ([birth year]) female who presents with chief complaint: non-healing wound.  This patient was sedated from recent narcotic use and was minimally arousable.  Onset of symptom occurred last December when she fell.  She broke an ankle and became bed-bound.  Later in March 2016, she feel again and lacerated her left lateral calf.  This wound has slowly broken down despite wound care.  Reported the patient has pain in the wound, severe enough to prevent ABI being done.  The patient does not ambulated so she does not have intermittent claudication.  She also does not have rest pain.  Atherosclerotic risk factors include: HTN, DM, HLD.  Past Medical History  Diagnosis Date  . HTN (hypertension)   . Diverticulosis   . Hemorrhoid   . Osteoarthritis   . Constipation, chronic   . H/O: hysterectomy   . Systolic and diastolic CHF, chronic     a. Echo 07/2011: Technically limited; inferior HK, inferolateral and possibly lateral wall HK, EF 40%, moderate LVE, aortic sclerosis without stenosis, mild MR, mild LAE, question RV dysfunction;   b.  Echo (04/2013): EF 50-55%, normal wall motion, moderate MR, mild to moderate LAE, PASP 37  . Female bladder prolapse     with prolapse uterus, cystocele, s/p TVH/BSO 12/2006  . Obesity (BMI 30-39.9)     wt. 94.1 kg 12/2010  . Moderate mitral regurgitation     a. Mod to Sev by echo 01/2009;  b. mild MR by echo 2013  . Atrial fibrillation 10/13/2011    coumadin Rx  . Iron deficiency anemia   . Pneumonia   . Shortness of breath dyspnea   . GERD (gastroesophageal reflux disease)   . DM type 2 (diabetes mellitus, type 2) 07/25/2014  . Hyperlipidemia   . Ankle fracture   . Fracture, ankle Dec. 26, 2015    Left  . Fall at home March 2016    Past Surgical History    Procedure Laterality Date  . Wrist surgery    . Vaginal hysterectomy,cystocele and prolapse  repair  NOV. 2008  . Abdominal hysterectomy    . Colonoscopy N/A 07/18/2013    Procedure: COLONOSCOPY;  Surgeon: Wonda Horner, MD;  Location: [facility] ENDOSCOPY;  Service: Endoscopy;  Laterality: N/A;  . Flexible sigmoidoscopy N/A 03/08/2014    Procedure: FLEXIBLE SIGMOIDOSCOPY;  Surgeon: Lear Ng, MD;  Location: [facility] ENDOSCOPY;  Service: Endoscopy;  Laterality: N/A;  need hoyer lift    History   Social History  . Marital Status: Single    Spouse Name: N/A  . Number of Children: N/A  . Years of Education: N/A   Occupational History  . retired Gaffer    Social History Main Topics  . Smoking status: Never Smoker   . Smokeless tobacco: Former Systems developer    Types: Snuff  . Alcohol Use: No  . Drug Use: No  . Sexual Activity: Not Currently   Other Topics Concern  . Not on file   Social History Narrative   Lives in [place] by herself.  Her dtr lives near by and prepares all of her meals and leaves them for the pt to heat up in the microwave.  Dtr pays close attn to salt in meals.  Pt is sedentary.  Most activity is  moving from couch to bed.  She ambulates w/ a walker.  Wt is up from 217 in January to 244 now.    Family History  Problem Relation Age of Onset  . Diabetes Brother   . Hypertension Mother     deceased @ 76  . Leukemia Brother   . Cancer Brother   . Stroke Mother   . Diabetes Father     deceased in his 14's     Current Outpatient Prescriptions on File Prior to Visit  Medication Sig Dispense Refill  . albuterol (PROVENTIL) (2.5 MG/3ML) 0.083% nebulizer solution Take 2.5 mg by nebulization every 8 (eight) hours as needed for wheezing or shortness of breath (For wheezing and shortness of breath).     Marland Kitchen atorvastatin (LIPITOR) 40 MG tablet Take 40 mg by mouth daily.    . bisacodyl (DULCOLAX) 10 MG suppository Place 10 mg rectally daily as needed for moderate  constipation (if constipation not relieved by MOM).    . carvedilol (COREG) 3.125 MG tablet Take 3.125 mg by mouth 2 (two) times daily with a meal. Hold if SBP <100    . clindamycin (CLEOCIN) 300 MG capsule Take 1 capsule (300 mg total) by mouth 4 (four) times daily. X 7 days 28 capsule 0  . ferrous fumarate (FERRETTS) 325 (106 FE) MG TABS Take 1 tablet by mouth daily.     Marland Kitchen HYDROcodone-acetaminophen (NORCO/VICODIN) 5-325 MG per tablet Take one tablet by mouth every 6 hours as needed for pain (control) 120 tablet 0  . insulin aspart (NOVOLOG) 100 UNIT/ML injection Inject into the skin 4 (four) times daily -  before meals and at bedtime. Per sliding scale- no sliding scale instructions on MAR    . Insulin Glargine (TOUJEO SOLOSTAR) 300 UNIT/ML SOPN Inject 10 Units into the skin at bedtime.    Marland Kitchen lactulose (CHRONULAC) 10 GM/15ML solution Take 10 g by mouth daily.    Marland Kitchen lisinopril (PRINIVIL,ZESTRIL) 2.5 MG tablet Take 2.5 mg by mouth daily.    Marland Kitchen loratadine (CLARITIN) 10 MG tablet Take 10 mg by mouth daily.    Marland Kitchen lubiprostone (AMITIZA) 24 MCG capsule Take 1 capsule (24 mcg total) by mouth 2 (two) times daily with a meal. (Patient taking differently: Take 24 mcg by mouth 2 (two) times daily with a meal. For constipation) 30 capsule 0  . magnesium hydroxide (MILK OF MAGNESIA) 400 MG/5ML suspension Take 30 mLs by mouth daily as needed for mild constipation.    . metolazone (ZAROXOLYN) 2.5 MG tablet Take 2.5 mg by mouth once a week. Weekly on Wed    . pantoprazole (PROTONIX) 40 MG tablet Take 1 tablet (40 mg total) by mouth 2 (two) times daily. (Patient taking differently: Take 40 mg by mouth 2 (two) times daily. For GERD)    . polyethylene glycol (MIRALAX / GLYCOLAX) packet Take 17 g by mouth daily.    . potassium chloride SA (K-DUR,KLOR-CON) 20 MEQ tablet Take 40 mEq by mouth 2 (two) times daily.    . predniSONE (DELTASONE) 10 MG tablet Take 30 mg by mouth daily with breakfast.    . PRESCRIPTION MEDICATION  Take 120 mLs by mouth 2 (two) times daily. MedPass    . promethazine (PHENERGAN) 25 MG tablet Take 25 mg by mouth every 6 (six) hours as needed for nausea or vomiting.    . sennosides-docusate sodium (SENOKOT-S) 8.6-50 MG tablet Take 1 tablet by mouth daily.    . sodium chloride (OCEAN) 0.65 % SOLN nasal spray  Place 1 spray into both nostrils 2 (two) times daily.    Marland Kitchen torsemide (DEMADEX) 20 MG tablet Take 20 mg by mouth 2 (two) times daily.     . traMADol (ULTRAM) 50 MG tablet Take one tablet by mouth every 8 hours as needed for moderate pain 90 tablet 5  . Vitamin D, Ergocalciferol, (DRISDOL) 50000 UNITS CAPS capsule Take 50,000 Units by mouth every 7 (seven) days. On Tuesdays, for low vit D    . LORazepam (ATIVAN) 0.5 MG tablet Take one tablet by mouth every 6 hours as needed for anxiety (Patient not taking: Reported on 09/13/2014) 120 tablet 5   No current facility-administered medications on file prior to visit.    Allergies  Allergen Reactions  . Ativan [Lorazepam] Itching, Swelling and Rash    Throat, Tongue swelling  . Levaquin [Levofloxacin] Other (See Comments)    Per MAR  . Lexapro [Escitalopram Oxalate] Swelling  . Penicillins Other (See Comments)    Per MAR  . Tetanus-Diphtheria Toxoids Td Other (See Comments)    Per MAR  . Latex Hives and Rash    REVIEW OF SYSTEMS:  (Positives checked otherwise negative)  Not able to obtained due to sedation   For VQI Use Only  PRE-ADM LIVING: Nursing home  AMB STATUS: Bedridden  CAD Sx: None  PRIOR CHF: None  STRESS TEST: [x]  No, [ ]  Normal, [ ]  + ischemia, [ ]  + MI, [ ]  Both   Physical Examination  Filed Vitals:   09/13/14 1402  BP: 124/62  Pulse: 83  Temp: 97.7 F (36.5 C)  TempSrc: Oral  Resp: 16  Height: 5\' 2"  (1.575 m)  Weight: 218 lb (98.884 kg)  SpO2: 100%   Body mass index is 39.86 kg/(m^2).  General: A&O x 3, WD, morbidly obese  Head: [place]/AT  Ear/Nose/Throat: Hearing cannot be tested, nares w/o  erythema or drainage, cannot exam oropharynx due to altered mental status   Eyes: PERRL, unable to test eye movement due to inability to cooperate  Neck: Supple, no nuchal rigidity, no palpable LAD, edematous neck  Pulmonary: Sym exp, suboptimal inspirations, CTAB, no rales, rhonchi, & wheezing, pursing lips with breathes  Cardiac: RRR, Nl S1, S2, no Murmurs, rubs or gallops  Vascular: Vessel Right Left  Radial Palpable Palpable  Brachial Palpable Palpable  Carotid Palpable, without bruit Palpable, without bruit  Aorta Not palpable N/A  Femoral Faintly Palpable, exam limited by pannus Palpable  Popliteal Not palpable Not palpable  PT Not Palpable Not Palpable  DP Palpable Faintly Palpable   Gastrointestinal: soft, NTND, -G/R, - HSM, - masses, - CVAT B  Musculoskeletal: unable to test Motor due to sedation, Extremities without ischemic changes except for clean ulcer on lateral calf down to muscle, ~2-3 cm in diameter  Neurologic: unable to test due to sedation  Psychiatric: unable to test due to sedation  Dermatologic: See M/S exam for extremity exam, no rashes otherwise noted  Lymph : No Cervical, Axillary, or Inguinal lymphadenopathy    Non-Invasive Vascular Imaging  BLE arterial duplex (Date: 09/13/2014)  R: triphasic pop, occluded PT, flow in peroneal and AT  L: mono pop, flow in all tibials   Medical Decision Making  [name] is a 80 y.o. female who presents with: LLE critical limb ischemia, bed-bound, likely B PAD, COPD vs obesity related hypoventiliation   I discussed with the patient's daughter the natural history of critical limb ischemia: 25% require amputation in one year, 50% are able to  maintain their limbs in one year, and 25-30% die in one year due to comorbidities.  Given the limb threatening status of this patient, I recommend an aggressive work up including proceeding with an: Aortogram, Bilateral runoff and intervention. I discussed with the  patient the nature of angiographic procedures, especially the limited patencies of any endovascular intervention. The patient is aware of that the risks of an angiographic procedure include but are not limited to: bleeding, infection, access site complications, embolization, rupture of treated vessel, dissection, possible need for emergent surgical intervention, and possible need for surgical procedures to treat the patient's pathology. The patient is aware of the risks and agrees to proceed.  The procedure is scheduled for: 17 AUG 16 in hybrid OR due to her poor respiratory status.  I discussed in depth with the patient the nature of atherosclerosis, and emphasized the importance of maximal medical management including strict control of blood pressure, blood glucose, and lipid levels, antiplatelet agents, obtaining regular exercise, and cessation of smoking.  The patient is aware that without maximal medical management the underlying atherosclerotic disease process will progress, limiting the benefit of any interventions. The patient is currently on a statin:  Lipitor. The patient is currently not on an anti-platelet: due to fall risks.  Thank you for allowing [name] to participate in this patient's care.  Adele Barthel, MD [facility] Office: [phone] Pager: [phone]  09/13/2014, 3:21 PM

## 2014-09-25 NOTE — Discharge Instructions (Signed)
Arteriogram °Care After °These instructions give you information on caring for yourself after your procedure. Your doctor may also give you more specific instructions. Call your doctor if you have any problems or questions after your procedure. °HOME CARE °· Do not bathe, swim, or use a hot tub until directed by your doctor. You can shower. °· Do not lift anything heavier than 10 pounds (about a gallon of milk) for 2 days. °· Do not walk a lot, run, or drive for 2 days. °· Return to normal activities in 2 days or as told by your doctor. °Finding out the results of your test °Ask when your test results will be ready. Make sure you get your test results. °GET HELP RIGHT AWAY IF:  °· You have fever. °· You have more pain in your leg. °· The leg that was cut is: °¨ Bleeding. °¨ Puffy (swollen) or red. °¨ Cold. °¨ Pale or changes color. °¨ Weak. °¨ Tingly or numb. °If you go to the Emergency Room, tell your nurse that you have had an arteriogram. Take this paper with you to show the nurse. °MAKE SURE YOU: °· Understand these instructions. °· Will watch your condition. °· Will get help right away if you are not doing well or get worse. °Document Released: 04/23/2008 Document Revised: 01/30/2013 Document Reviewed: 04/23/2008 °[employer]® Patient Information ©2015 [employer]. This information is not intended to replace advice given to you by your health care provider. Make sure you discuss any questions you have with your health care provider. ° °

## 2014-09-25 NOTE — Progress Notes (Signed)
[address], spoke with [place], [phone]) day nurse regarding BMP results, reported that K=3.4 this morning but was 6.8 yesterday with [facility] given. Fax number given so that results could be faxed.

## 2014-09-26 ENCOUNTER — Non-Acute Institutional Stay (SKILLED_NURSING_FACILITY): Payer: Medicare Other | Admitting: Internal Medicine

## 2014-09-26 ENCOUNTER — Encounter (HOSPITAL_COMMUNITY): Payer: Self-pay | Admitting: Vascular Surgery

## 2014-09-26 DIAGNOSIS — E1121 Type 2 diabetes mellitus with diabetic nephropathy: Secondary | ICD-10-CM

## 2014-09-26 DIAGNOSIS — I739 Peripheral vascular disease, unspecified: Secondary | ICD-10-CM

## 2014-09-26 DIAGNOSIS — I5042 Chronic combined systolic (congestive) and diastolic (congestive) heart failure: Secondary | ICD-10-CM

## 2014-09-26 DIAGNOSIS — L98499 Non-pressure chronic ulcer of skin of other sites with unspecified severity: Secondary | ICD-10-CM | POA: Diagnosis not present

## 2014-09-26 DIAGNOSIS — K219 Gastro-esophageal reflux disease without esophagitis: Secondary | ICD-10-CM

## 2014-09-26 DIAGNOSIS — E875 Hyperkalemia: Secondary | ICD-10-CM

## 2014-09-26 DIAGNOSIS — N182 Chronic kidney disease, stage 2 (mild): Secondary | ICD-10-CM

## 2014-09-26 DIAGNOSIS — E785 Hyperlipidemia, unspecified: Secondary | ICD-10-CM

## 2014-09-26 DIAGNOSIS — I70209 Unspecified atherosclerosis of native arteries of extremities, unspecified extremity: Secondary | ICD-10-CM

## 2014-09-26 NOTE — Progress Notes (Signed)
MRN: [id number] Name: [name]  Sex: female Age: 80 y.o. DOB: 1934/11/02  [place] [facility]/Room:305 Level Of Care: SNF Provider: Inocencio Homes D Emergency Contacts: Extended Emergency Contact Information Primary Emergency Contact: Ruiter,[name]          [place] Phone: [phone] Relation: Daughter Secondary Emergency Contact: Bunker,Reggie Address: [place], [place] [address] [name] of [place] Phone: [phone] Work Phone: [phone] Relation: Son  Code Status:   Allergies: Ativan; Lexapro; Latex; Levaquin; Penicillins; and Tetanus-diphtheria toxoids td  Chief Complaint  Patient presents with  . Readmit To SNF    HPI: Patient is 80 y.o. female with history of congestive heart failure, NYHA stage 4, diverticulitis, left ankle fracture with residual chronic wound presents to the ED with an abnormally high potassium of 7.4 found during a pre-op w/u for vascular procedure. Patient has had slight increase in her creatinine level also. Pt was admitted to hospital overnight 8/11-12, tx with IV insulin and kayaxalate with resolution. Pt is admitted to SNF for generalized weakness and residential care. At SNF pt is followed for diastolic CHF, NYHA class 4, tx with O2 and coreg, HLD, tx with lipitor and GERD, tx with protonix.  Past Medical History  Diagnosis Date  . HTN (hypertension)   . Diverticulosis   . Hemorrhoid   . Osteoarthritis   . Constipation, chronic   . H/O: hysterectomy   . Systolic and diastolic CHF, chronic     a. Echo 07/2011: Technically limited; inferior HK, inferolateral and possibly lateral wall HK, EF 40%, moderate LVE, aortic sclerosis without stenosis, mild MR, mild LAE, question RV dysfunction;   b.  Echo (04/2013): EF 50-55%, normal wall motion, moderate MR, mild to moderate LAE, PASP 37  . Female bladder prolapse     with prolapse uterus, cystocele, s/p TVH/BSO 12/2006   . Obesity (BMI 30-39.9)     wt. 94.1 kg 12/2010  . Moderate mitral regurgitation     a. Mod to Sev by echo 01/2009;  b. mild MR by echo 2013  . Atrial fibrillation 10/13/2011    coumadin Rx  . Iron deficiency anemia   . Pneumonia   . Shortness of breath dyspnea   . GERD (gastroesophageal reflux disease)   . DM type 2 (diabetes mellitus, type 2) 07/25/2014  . Hyperlipidemia   . Ankle fracture   . Fracture, ankle Dec. 26, 2015    Left  . Fall at home March 2016  . Dysrhythmia   . History of hiatal hernia     Past Surgical History  Procedure Laterality Date  . Wrist surgery    . Vaginal hysterectomy,cystocele and prolapse  repair  NOV. 2008  . Abdominal hysterectomy    . Colonoscopy N/A 07/18/2013    Procedure: COLONOSCOPY;  Surgeon: Wonda Horner, MD;  Location: [facility];  Service: Endoscopy;  Laterality: N/A;  . Flexible sigmoidoscopy N/A 03/08/2014    Procedure: FLEXIBLE SIGMOIDOSCOPY;  Surgeon: Lear Ng, MD;  Location: [facility] ENDOSCOPY;  Service: Endoscopy;  Laterality: N/A;  need hoyer lift  . Aortogram Left 09/25/2014    Procedure: AORTOGRAM WITH LEFT LEG LOWER EXTREMIY RUNOFF; ANGIOPLASTY OF TIBIAL AND PERONEAL ARTERY;  Surgeon: Conrad Mulberry, MD;  Location: [facility];  Service: Vascular;  Laterality: Left;      Medication List       This list is accurate as of: 09/26/14 11:59  PM.  Always use your most recent med list.               albuterol (2.5 MG/3ML) 0.083% nebulizer solution  Commonly known as:  PROVENTIL  Take 2.5 mg by nebulization every 4 (four) hours as needed for wheezing or shortness of breath (For wheezing and shortness of breath).     atorvastatin 40 MG tablet  Commonly known as:  LIPITOR  Take 40 mg by mouth daily.     bisacodyl 10 MG suppository  Commonly known as:  DULCOLAX  Place 10 mg rectally daily as needed for moderate constipation (if constipation not relieved by MOM).     carvedilol 3.125 MG tablet  Commonly known as:  COREG  Take  3.125 mg by mouth 2 (two) times daily with a meal. Hold if SBP <100     clopidogrel 75 MG tablet  Commonly known as:  PLAVIX  Take 1 tablet (75 mg total) by mouth daily.     feeding supplement (PRO-STAT SUGAR FREE 64) Liqd  Take 30 mLs by mouth 2 (two) times daily.     ferrous sulfate 325 (65 FE) MG tablet  Take 325 mg by mouth daily.     HUMALOG KWIKPEN 100 UNIT/ML KiwkPen  Generic drug:  insulin lispro  Inject 0-10 Units into the skin 4 (four) times daily -  before meals and at bedtime. 150 or less= 0 units 151-200= 2 units 201-250= 4 units 251-300= 6 units 301-350= 8 units 351-400= 10 units     HYDROcodone-acetaminophen 5-325 MG per tablet  Commonly known as:  NORCO/VICODIN  Take 1 tablet by mouth 3 (three) times daily as needed (pain).     lactulose 10 GM/15ML solution  Commonly known as:  CHRONULAC  Take 10 g by mouth daily.     loratadine 10 MG tablet  Commonly known as:  CLARITIN  Take 10 mg by mouth daily.     lubiprostone 24 MCG capsule  Commonly known as:  AMITIZA  Take 1 capsule (24 mcg total) by mouth 2 (two) times daily with a meal.     metolazone 2.5 MG tablet  Commonly known as:  ZAROXOLYN  Take 2.5 mg by mouth once a week. Weekly on Wed     pantoprazole 40 MG tablet  Commonly known as:  PROTONIX  Take 1 tablet (40 mg total) by mouth 2 (two) times daily.     polyethylene glycol packet  Commonly known as:  MIRALAX / GLYCOLAX  Take 17 g by mouth daily.     potassium chloride SA 20 MEQ tablet  Commonly known as:  K-DUR,KLOR-CON  Take 1 tablet (20 mEq total) by mouth daily.     predniSONE 10 MG tablet  Commonly known as:  DELTASONE  Take 10 mg by mouth daily with breakfast.     RA SALINE ENEMA RE  Place 1 each rectally daily as needed (constipation).     sennosides-docusate sodium 8.6-50 MG tablet  Commonly known as:  SENOKOT-S  Take 1 tablet by mouth daily.     torsemide 20 MG tablet  Commonly known as:  DEMADEX  Take 20 mg by mouth 2 (two)  times daily.     TOUJEO SOLOSTAR 300 UNIT/ML Sopn  Generic drug:  Insulin Glargine  Inject 10 Units into the skin at bedtime.     traMADol 50 MG tablet  Commonly known as:  ULTRAM  Take 1 tablet (50 mg total) by mouth every 8 (eight) hours.  No orders of the defined types were placed in this encounter.     There is no immunization history on file for this patient.  Social History  Substance Use Topics  . Smoking status: Never Smoker   . Smokeless tobacco: Former Systems developer    Types: Snuff  . Alcohol Use: No    Family history is + HTN, stroke   Review of Systems  DATA OBTAINED: from patient, nurse GENERAL:  no fevers, new fatigue, appetite changes SKIN: No itching, rash  EYES: No eye pain, redness, discharge EARS: No earache, tinnitus, change in hearing NOSE: No congestion, drainage or bleeding  MOUTH/THROAT: No mouth or tooth pain, No sore throat RESPIRATORY: No cough, wheezing, SOB CARDIAC: No chest pain, palpitations, lower extremity edema  GI: No abdominal pain, No N/V/D or constipation, No heartburn or reflux  GU: No dysuria, frequency or urgency, or incontinence  MUSCULOSKELETAL: No unrelieved bone/joint pain NEUROLOGIC: No headache, dizziness or focal weakness PSYCHIATRIC: No c/o anxiety or sadness   Filed Vitals:   09/26/14 2130  BP: 111/66  Pulse: 70  Temp: 98 F (36.7 C)  Resp: 20    SpO2 Readings from Last 1 Encounters:  09/25/14 98%        Physical Exam  GENERAL APPEARANCE: Alert, min conversant,BF  No acute distress.  SKIN: No diaphoresis rash HEAD: Normocephalic, atraumatic  EYES: Conjunctiva/lids clear. Pupils round, reactive. EOMs intact.  EARS: External exam WNL, canals clear. Hearing grossly normal.  NOSE: No deformity or discharge.  MOUTH/THROAT: Lips w/o lesions  RESPIRATORY: Breathing is even, unlabored. Lung sounds are clear   CARDIOVASCULAR: Heart RRR no murmurs, rubs or gallops. trace peripheral edema.   GASTROINTESTINAL:  Abdomen is soft, non-tender, not distended w/ normal bowel sounds. GENITOURINARY: Bladder non tender, not distended  MUSCULOSKELETAL: No abnormal joints or musculature NEUROLOGIC:  Cranial nerves 2-12 grossly intact. Moves all extremities  PSYCHIATRIC: depressed, no behavioral issues  Patient Active Problem List   Diagnosis Date Noted  . Atherosclerotic PVD with ulceration 09/30/2014  . Pressure ulcer 09/20/2014  . Hyperkalemia 09/19/2014  . Atherosclerosis of artery of extremity with ulceration 09/13/2014  . Hyperlipidemia   . DM type 2 (diabetes mellitus, type 2) 07/25/2014  . Thrush 07/04/2014  . Left tibial fracture 04/26/2014  . Vitamin D deficiency 03/26/2014  . Left fibular fracture 03/26/2014  . Chronic kidney disease (CKD), stage II (mild) 10/18/2013  . Chronic respiratory failure 08/28/2013  . Chronic diastolic heart failure 16/11/9602  . Immobility 08/15/2013  . Acute respiratory failure 08/13/2013  . GERD (gastroesophageal reflux disease) 08/13/2013  . CHF (congestive heart failure) 08/04/2013  . Acute combined systolic and diastolic congestive heart failure, NYHA class 4 08/04/2013  . GI bleed 07/13/2013  . History of lower GI bleeding 07/11/2013  . AKI (acute kidney injury) 06/29/2013  . Acute lower GI bleeding 06/23/2013  . PNA (pneumonia) 06/19/2013  . Rash and nonspecific skin eruption 06/19/2013  . Chronic respiratory failure with hypoxia 06/18/2013  . Acute encephalopathy 06/07/2013  . Diastolic CHF 54/10/8117  . Pulmonary hypertension 06/05/2013  . Weakness 06/03/2013  . Encounter for therapeutic drug monitoring 04/10/2013  . Long term current use of anticoagulant 10/26/2011  . Hypokalemia 10/15/2011  . Abdominal pain 10/15/2011  . Iron deficiency anemia 10/15/2011  . Bladder prolapse, female, acquired 10/15/2011  . Atrial fibrillation 10/15/2011  . Acute on chronic combined systolic and diastolic congestive heart failure 07/22/2011  . Facial edema  12/30/2010  . Leukopenia 12/30/2010  . Neutropenia 12/30/2010  .  Chronic pain 12/25/2010  . Hematochezia 12/25/2010  . Dehydration 12/25/2010  . Thrombocytosis 12/25/2010  . Hyponatremia 12/25/2010  . Chronic combined systolic and diastolic congestive heart failure, NYHA class 4   . Female bladder prolapse   . Mitral regurgitation 06/04/2010  . HYPOPOTASSEMIA 09/24/2009  . Chronic combined systolic and diastolic congestive heart failure 09/09/2009  . CARDIOMYOPATHY 03/04/2009  . ANASARCA 03/04/2009  . Hypertensive heart disease with CHF 02/20/2009  . CONSTIPATION, CHRONIC 02/20/2009  . DIVERTICULOSIS 02/20/2009    CBC    Component Value Date/Time   WBC 7.9 09/20/2014 0838   RBC 3.87 09/20/2014 0838   RBC 1.95* 06/23/2013 1118   HGB 12.9 09/25/2014 1033   HCT 38.0 09/25/2014 1033   PLT 202 09/20/2014 0838   MCV 103.9* 09/20/2014 0838   LYMPHSABS 1.6 09/20/2014 0838   MONOABS 0.4 09/20/2014 0838   EOSABS 0.1 09/20/2014 0838   BASOSABS 0.1 09/20/2014 0838    CMP     Component Value Date/Time   NA 139 09/25/2014 1033   K 5.7* 09/25/2014 1033   CL 97* 09/20/2014 0838   CO2 25 09/20/2014 0838   GLUCOSE 126* 09/25/2014 1033   BUN 29* 09/20/2014 0838   CREATININE 1.48* 09/20/2014 0838   CREATININE 1.62* 02/22/2014 1613   CALCIUM 9.4 09/20/2014 0838   PROT 5.9* 09/20/2014 0340   ALBUMIN 3.3* 09/20/2014 0340   AST 45* 09/20/2014 0340   ALT 29 09/20/2014 0340   ALKPHOS 92 09/20/2014 0340   BILITOT 1.3* 09/20/2014 0340   GFRNONAA 33* 09/20/2014 0838   GFRAA 38* 09/20/2014 0838    Lab Results  Component Value Date   HGBA1C 7.4* 09/20/2014     No results found.  Not all labs, radiology exams or other studies done during hospitalization come through on my EPIC note; however they are reviewed by me.    Assessment and Plan  Hyperkalemia Admitted with severe hyperkalemia-no obvious medications her cause identified with the exception of potassium supplementation  and use of MOM-per H&P-EDMD spoke with nephrology-was suggested that magnesium can enhance absorption of potassium and cause hyperkalemia. She is not on any ACE inhibitor. Patient was admitted and given IV insulin and Kayexalate. Her potassium this morning is 3.3. We will reduce her potassium supplementation to 20 mEq daily, SNF plan - serial BMP;in fact after admission to SNF pt's K+ dropped into the 2's and had to be repleated; will contiue to follow K+ with BMP  Chronic combined systolic and diastolic congestive heart failure, NYHA class 4 Clinically compensated; SNF plan-monitor lytes, weight and pedal edema; cont demedex and coreg  Chronic kidney disease (CKD), stage II (mild) Hosp Cr baseline; SNF plan - monitor with BMPs, UOP, edema  DM type 2 (diabetes mellitus, type 2) A1c 7.4 SNF plan -continue toujeo and SSI;pt on statin;not on ACE 2/2 hyperkalemia  Atherosclerotic PVD with ulceration SNF plan - Dr. Lance Bosch apparently is scheduled for a aortogram with bilateral runoff and further intervention on 8/17., stents  GERD (gastroesophageal reflux disease) SNF plan - cont protonix, asymptomatic  Hyperlipidemia SNF plan - cont lipitor 80 mg daily    Hennie Duos, MD

## 2014-09-30 ENCOUNTER — Encounter: Payer: Self-pay | Admitting: Internal Medicine

## 2014-09-30 ENCOUNTER — Telehealth: Payer: Self-pay | Admitting: Vascular Surgery

## 2014-09-30 DIAGNOSIS — I70209 Unspecified atherosclerosis of native arteries of extremities, unspecified extremity: Secondary | ICD-10-CM | POA: Insufficient documentation

## 2014-09-30 DIAGNOSIS — L98499 Non-pressure chronic ulcer of skin of other sites with unspecified severity: Secondary | ICD-10-CM

## 2014-09-30 NOTE — Assessment & Plan Note (Signed)
Hosp Cr baseline; SNF plan - monitor with BMPs, UOP, edema

## 2014-09-30 NOTE — Assessment & Plan Note (Signed)
Admitted with severe hyperkalemia-no obvious medications her cause identified with the exception of potassium supplementation and use of MOM-per H&P-EDMD spoke with nephrology-was suggested that magnesium can enhance absorption of potassium and cause hyperkalemia. She is not on any ACE inhibitor. Patient was admitted and given IV insulin and Kayexalate. Her potassium this morning is 3.3. We will reduce her potassium supplementation to 20 mEq daily, SNF plan - serial BMP;in fact after admission to SNF pt's K+ dropped into the 2's and had to be repleated; will contiue to follow K+ with BMP

## 2014-09-30 NOTE — Telephone Encounter (Signed)
-----   Message from Mena Goes, RN sent at 09/25/2014  2:34 PM EDT ----- Regarding: Schedule   ----- Message -----    From: Conrad Poca, MD    Sent: 09/25/2014   2:03 PM      To: [facility] Charge Pool  [name] [id number] Jun 22, 1934  PROCEDURE: 1.  right common femoral artery cannulation under ultrasound guidance 2.  Placement of catheter in aorta 3.  Aortogram with carbon dioxide 4.  Third order arterial selection 5.  Left leg runoff 6.  Angioplasty left superficial femoral artery (5 mm x 40 mm) 7.  Angioplasty left peroneal artery (2.5 x 20 mm)   Follow-up: 4 weeks  Orders(s) for follow-up: LLE ABI

## 2014-09-30 NOTE — Assessment & Plan Note (Signed)
SNF plan - cont lipitor 80 mg daily

## 2014-09-30 NOTE — Assessment & Plan Note (Addendum)
Clinically compensated; SNF plan-monitor lytes, weight and pedal edema; cont demedex and coreg

## 2014-09-30 NOTE — Assessment & Plan Note (Signed)
SNF plan - cont protonix, asymptomatic

## 2014-09-30 NOTE — Assessment & Plan Note (Signed)
SNF plan - Dr. Lance Bosch apparently is scheduled for a aortogram with bilateral runoff and further intervention on 8/17., stents

## 2014-09-30 NOTE — Telephone Encounter (Signed)
Mailbox is full, mailed letter, dpm

## 2014-09-30 NOTE — Assessment & Plan Note (Signed)
A1c 7.4 SNF plan -continue toujeo and SSI;pt on statin;not on ACE 2/2 hyperkalemia

## 2014-10-02 ENCOUNTER — Other Ambulatory Visit (HOSPITAL_COMMUNITY): Payer: Self-pay | Admitting: Nurse Practitioner

## 2014-10-02 DIAGNOSIS — K831 Obstruction of bile duct: Secondary | ICD-10-CM

## 2014-10-03 ENCOUNTER — Encounter: Payer: Medicare Other | Admitting: Surgery

## 2014-10-03 DIAGNOSIS — L97122 Non-pressure chronic ulcer of left thigh with fat layer exposed: Secondary | ICD-10-CM | POA: Diagnosis not present

## 2014-10-03 NOTE — Progress Notes (Addendum)
TATEJasamine, Pottinger ([id number]) Visit Report for 10/03/2014 Chief Complaint Document Details Patient Name: [name], [name] Date of Service: 10/03/2014 2:30 PM Medical Record Number: [id number] Patient Account Number: [id number] Date of Birth/Sex: [birth year] (80 y.o. Female) Treating RN: Montey Hora Primary Care Physician: Kelton Pillar Other Clinician: Referring Physician: Kelton Pillar Treating Physician/Extender: Frann Rider in Treatment: 7 Information Obtained from: Patient Chief Complaint Patient presents to the wound care center for a consult due non healing wound. ulcerated area on her left lower extremity and her left thigh for about 3 months. Electronic Signature(s) Signed: 10/03/2014 3:29:54 PM By: Christin Fudge MD, FACS Previous Signature: 10/03/2014 3:15:22 PM Version By: Christin Fudge MD, FACS Entered By: Christin Fudge on 10/03/2014 15:29:54 [name], [name] ([id number]) -------------------------------------------------------------------------------- HPI Details Patient Name: [name], [name] Date of Service: 10/03/2014 2:30 PM Medical Record Number: [id number] Patient Account Number: [id number] Date of Birth/Sex: [birth year] (80 y.o. Female) Treating RN: Montey Hora Primary Care Physician: Kelton Pillar Other Clinician: Referring Physician: Kelton Pillar Treating Physician/Extender: Frann Rider in Treatment: 7 History of Present Illness Location: left lower extremity and left thigh Quality: Patient reports experiencing a sharp pain to affected area(s). Severity: Patient states wound are getting worse. Duration: Patient has had the wound for > 3 months prior to seeking treatment at the wound center Timing: Pain in wound is Intermittent (comes and goes Context: The wound occurred when the patient had a fall in the skilled nursing facility and injured her left lateral lower extremity. Modifying Factors: Consults to this date include: orthopedics where  she had a cast applied initially and also has gone to the ER a few times. Associated Signs and Symptoms: Patient reports having difficulty standing and is in bed for long periods. HPI Description: The patient was taken to the ED on 07/30/14 by her daughter for a worsening wound on her left lower lateral leg which has been there for a while. Xray of the left lower extremity -IMPRESSION:Healing oblique distal tibial fracture. Soft tissue injury along the lateral aspect of the mid/distal fibula. No radiographic findings to suggest acute osteomyelitis. The patient was put on clindamycin and given some wound care. Her last hemoglobin A1c was 8.8. opast medical history significant for -- diabetes mellitus type 2, dyspnea, hypertension, chronic constipation, CHF, history of ankle fracture, vaginal hysterectomy, abdomen and pain on and off, status post flexible sigmoidoscopy. 09/17/2014 -- her daughter is not there at the bedside and we do not have any vascular reports which was supposed to be done a couple of weeks ago. 10/03/2014 -- Dr. Bridgett Larsson and did an angioplasty for her on 09/25/2014 where the left femoral artery and the left peroneal artery angioplasty was performed. Her daughter is here today and says that she is reopened the left thigh ulceration and has a new cluster of ulcers on the right thigh.. Probably scratches this area in her sleep. Electronic Signature(s) Signed: 10/03/2014 3:30:27 PM By: Christin Fudge MD, FACS Previous Signature: 10/03/2014 3:15:41 PM Version By: Christin Fudge MD, FACS Previous Signature: 10/03/2014 3:13:49 PM Version By: Christin Fudge MD, FACS Entered By: Christin Fudge on 10/03/2014 15:30:27 [name], [name] ([id number]) -------------------------------------------------------------------------------- Physical Exam Details Patient Name: [name], [name] Date of Service: 10/03/2014 2:30 PM Medical Record Number: [id number] Patient Account Number: [id number] Date of  Birth/Sex: Dec 15, 1934 (80 y.o. Female) Treating RN: Montey Hora Primary Care Physician: Kelton Pillar Other Clinician: Referring Physician: Kelton Pillar Treating Physician/Extender: Frann Rider in Treatment: 7 Constitutional . Pulse regular. Respirations normal and  unlabored. Afebrile. . Eyes Nonicteric. Reactive to light. Ears, Nose, Mouth, and Throat Lips, teeth, and gums WNL.Marland Kitchen Moist mucosa without lesions . Neck supple and nontender. No palpable supraclavicular or cervical adenopathy. Normal sized without goiter. Respiratory WNL. No retractions.. Cardiovascular Pedal Pulses WNL. No clubbing, cyanosis or edema. Chest Breasts symmetical and no nipple discharge.. Breast tissue WNL, no masses, lumps, or tenderness.. Lymphatic No adneopathy. No adenopathy. No adenopathy. Musculoskeletal Adexa without tenderness or enlargement.. Digits and nails w/o clubbing, cyanosis, infection, petechiae, ischemia, or inflammatory conditions.. Integumentary (Hair, Skin) No suspicious lesions. No crepitus or fluctuance. No peri-wound warmth or erythema. No masses.Marland Kitchen Psychiatric Judgement and insight Intact.. No evidence of depression, anxiety, or agitation.. Notes She has reopened the ulceration on the left thigh and she has got a cluster of wounds on the right thigh where she scratched and these are down to the subcutaneous tissue. The ulcer on the left lower lateral leg has minimal tendon exposed but is clean with granulating tissue. Electronic Signature(s) Signed: 10/03/2014 3:28:54 PM By: Christin Fudge MD, FACS Entered By: Christin Fudge on 10/03/2014 15:28:53 [name], [name] ([id number]) -------------------------------------------------------------------------------- Physician Orders Details Patient Name: [name], [name] Date of Service: 10/03/2014 2:30 PM Medical Record Number: [id number] Patient Account Number: [id number] Date of Birth/Sex: 19-Jan-1935 (80 y.o. 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 Wound #2 Lateral,Posterior Upper Leg o Clean wound with Normal Saline. Wound #3 Right,Posterior Upper Leg o Clean wound with Normal Saline. Anesthetic Wound #1 Left,Lateral Lower Leg o Topical Lidocaine 4% cream applied to wound bed prior to debridement Wound #2 Lateral,Posterior Upper Leg o Topical Lidocaine 4% cream applied to wound bed prior to debridement Wound #3 Right,Posterior Upper Leg o Topical Lidocaine 4% cream applied to wound bed prior to debridement Skin Barriers/Peri-Wound Care Wound #1 Left,Lateral Lower Leg o Skin Prep Wound #2 Lateral,Posterior Upper Leg o Skin Prep Wound #3 Right,Posterior Upper Leg Pauwels, Shylie A. ([id number]) o Skin Prep Primary Wound Dressing Wound #1 Left,Lateral Lower Leg o Aquacel Ag Wound #2 Lateral,Posterior Upper Leg o Aquacel Ag Wound #3 Right,Posterior Upper Leg o Aquacel Ag Secondary Dressing Wound #1 Left,Lateral Lower Leg o Boardered Foam Dressing Wound #2 Lateral,Posterior Upper Leg o Boardered Foam Dressing Wound #3 Right,Posterior Upper Leg o Boardered Foam Dressing Dressing Change Frequency Wound #1 Left,Lateral Lower Leg o Change dressing every other day. Wound #2  Lateral,Posterior Upper Leg o Change dressing every other day. Wound #3 Right,Posterior Upper Leg o Change dressing every other day. Follow-up Appointments Wound #1 Left,Lateral Lower Leg o Return Appointment in: - 3 weeks Wound #2 Lateral,Posterior Upper Leg o Return Appointment in: - 3 weeks Wound #3 Right,Posterior Upper Leg o Return Appointment in: - 3 weeks Off-Loading Wound #1 Left,Lateral Lower Leg o Turn and reposition every 2 hours Wound #2 Lateral,Posterior Upper Leg Kirschenbaum, Melrose A. ([id number]) o Turn and reposition every 2 hours Wound #3 Right,Posterior Upper Leg o Turn  and reposition every 2 hours Additional Orders / Instructions Wound #1 Left,Lateral Lower Leg o Increase protein intake. Wound #2 Lateral,Posterior Upper Leg o Increase protein intake. Wound #3 Right,Posterior Upper Leg o Increase protein intake. Electronic Signature(s) Signed: 10/03/2014 4:07:43 PM By: Christin Fudge MD, FACS Signed: 10/03/2014 4:40:04 PM By: Montey Hora Entered By: Montey Hora on 10/03/2014 15:27:43 Caruthers, [name] ([id number]) -------------------------------------------------------------------------------- Problem List Details Patient Name: Bedore, Jaia A. Date of Service: 10/03/2014 2:30 PM Medical Record Number: [id number] Patient Account Number: [id number] Date of Birth/Sex: May 11, 1934 (80 y.o. Female) Treating RN: Montey Hora Primary Care Physician: Kelton Pillar Other Clinician: Referring Physician: Kelton Pillar Treating Physician/Extender: Frann Rider in Treatment: 7 Active Problems ICD-10 Encounter Code Description Active Date Diagnosis E11.622 Type 2 diabetes mellitus with other skin ulcer 08/13/2014 Yes S81.812A Laceration without foreign body, left lower leg, initial 08/13/2014 Yes encounter L97.122 Non-pressure chronic ulcer of left thigh with fat layer 08/13/2014 Yes exposed E66.01 Morbid (severe) obesity due to excess  calories 08/13/2014 Yes I70.242 Atherosclerosis of native arteries of left leg with ulceration 10/03/2014 Yes of calf L97.112 Non-pressure chronic ulcer of right thigh with fat layer 10/03/2014 Yes exposed Inactive Problems Resolved Problems Electronic Signature(s) Signed: 10/03/2014 3:29:42 PM By: Christin Fudge MD, FACS Previous Signature: 10/03/2014 3:15:16 PM Version By: Christin Fudge MD, FACS Entered By: Christin Fudge on 10/03/2014 15:29:42 [name], [name] ([id number]) -------------------------------------------------------------------------------- Progress Note Details Patient Name: [name], [name] Date of Service: 10/03/2014 2:30 PM Medical Record Number: [id number] Patient Account Number: [id number] Date of Birth/Sex: 1934/06/11 (80 y.o. Female) Treating RN: Montey Hora Primary Care Physician: Kelton Pillar Other Clinician: Referring Physician: Kelton Pillar Treating Physician/Extender: Frann Rider in Treatment: 7 Subjective Chief Complaint Information obtained from Patient Patient presents to the wound care center for a consult due non healing wound. ulcerated area on her left lower extremity and her left thigh for about 3 months. History of Present Illness (HPI) The following HPI elements were documented for the patient's wound: Location: left lower extremity and left thigh Quality: Patient reports experiencing a sharp pain to affected area(s). Severity: Patient states wound are getting worse. Duration: Patient has had the wound for > 3 months prior to seeking treatment at the wound center Timing: Pain in wound is Intermittent (comes and goes Context: The wound occurred when the patient had a fall in the skilled nursing facility and injured her left lateral lower extremity. Modifying Factors: Consults to this date include: orthopedics where she had a cast applied initially and also has gone to the ER a few times. Associated Signs and Symptoms: Patient reports  having difficulty standing and is in bed for long periods. The patient was taken to the ED on 07/30/14 by her daughter for a worsening wound on her left lower lateral leg which has been there for a while. Xray of the left lower extremity -IMPRESSION:Healing oblique distal tibial fracture. Soft tissue injury along the lateral aspect of the mid/distal fibula. No radiographic findings to suggest acute osteomyelitis. The patient was put on clindamycin and given some wound care. Her last hemoglobin A1c was 8.8. past medical history significant for -- diabetes mellitus type 2, dyspnea, hypertension, chronic constipation, CHF, history of ankle fracture, vaginal hysterectomy, abdomen and pain on and off, status post flexible sigmoidoscopy. 09/17/2014 -- her daughter is not there at the bedside and we do not have any vascular reports which was supposed to be done a couple of weeks ago. 10/03/2014 -- Dr. Bridgett Larsson and did an angioplasty  for her on 09/25/2014 where the left femoral artery and the left peroneal artery angioplasty was performed. Her daughter is here today and says that she is reopened the left thigh ulceration and has a new cluster of ulcers on the right thigh.. Probably scratches this area in her sleep. [name], [name] ([id number]) Objective Constitutional Pulse regular. Respirations normal and unlabored. Afebrile. Vitals Time Taken: 3:05 PM, Height: 61 in, Weight: 224 lbs, BMI: 42.3, Temperature: 98.7 F, Pulse: 79 bpm, Respiratory Rate: 18 breaths/min, Blood Pressure: 112/69 mmHg. Eyes Nonicteric. Reactive to light. Ears, Nose, Mouth, and Throat Lips, teeth, and gums WNL.Marland Kitchen Moist mucosa without lesions . Neck supple and nontender. No palpable supraclavicular or cervical adenopathy. Normal sized without goiter. Respiratory WNL. No retractions.. Cardiovascular Pedal Pulses WNL. No clubbing, cyanosis or edema. Chest Breasts symmetical and no nipple discharge.. Breast tissue WNL, no  masses, lumps, or tenderness.. Lymphatic No adneopathy. No adenopathy. No adenopathy. Musculoskeletal Adexa without tenderness or enlargement.. Digits and nails w/o clubbing, cyanosis, infection, petechiae, ischemia, or inflammatory conditions.Marland Kitchen Psychiatric Judgement and insight Intact.. No evidence of depression, anxiety, or agitation.. General Notes: She has reopened the ulceration on the left thigh and she has got a cluster of wounds on the right thigh where she scratched and these are down to the subcutaneous tissue. The ulcer on the left lower lateral leg has minimal tendon exposed but is clean with granulating tissue. Integumentary (Hair, Skin) No suspicious lesions. No crepitus or fluctuance. No peri-wound warmth or erythema. No masses.. Wound #1 status is Open. Original cause of wound was Trauma. The wound is located on the Left,Lateral Lower Leg. The wound measures 2.2cm length x 0.4cm width x 1.2cm depth; 0.691cm^2 area and [name], [name] ([id number]) 0.829cm^3 volume. There is tendon exposed. There is no tunneling or undermining noted. There is a medium amount of serous drainage noted. The wound margin is flat and intact. There is large (67-100%) red granulation within the wound bed. There is no necrotic tissue within the wound bed. The periwound skin appearance exhibited: Moist. The periwound skin appearance did not exhibit: Callus, Crepitus, Excoriation, Fluctuance, Friable, Induration, Localized Edema, Rash, Scarring, Dry/Scaly, Maceration, Atrophie Blanche, Cyanosis, Ecchymosis, Hemosiderin Staining, Mottled, Pallor, Rubor, Erythema. Periwound temperature was noted as No Abnormality. The periwound has tenderness on palpation. Wound #2 status is Open. Original cause of wound was Trauma. The wound is located on the Lateral,Posterior Upper Leg. The wound measures 0.6cm length x 1.2cm width x 0.2cm depth; 0.565cm^2 area and 0.113cm^3 volume. The wound is limited to skin breakdown.  There is no tunneling or undermining noted. There is a small amount of serous drainage noted. The wound margin is flat and intact. There is medium (34-66%) red granulation within the wound bed. There is a medium (34-66%) amount of necrotic tissue within the wound bed including Adherent Slough. The periwound skin appearance exhibited: Moist. The periwound skin appearance did not exhibit: Callus, Crepitus, Excoriation, Fluctuance, Friable, Induration, Localized Edema, Rash, Scarring, Dry/Scaly, Maceration, Atrophie Blanche, Cyanosis, Ecchymosis, Hemosiderin Staining, Mottled, Pallor, Rubor, Erythema. Periwound temperature was noted as No Abnormality. Wound #3 status is Open. Original cause of wound was Pressure Injury. The wound is located on the Right,Posterior Upper Leg. The wound measures 6cm length x 3.3cm width x 0.2cm depth; 15.551cm^2 area and 3.11cm^3 volume. The wound is limited to skin breakdown. There is no tunneling or undermining noted. There is a medium amount of serous drainage noted. The wound margin is flat and intact. There is medium (34-66%) pink  granulation within the wound bed. There is a medium (34-66%) amount of necrotic tissue within the wound bed including Adherent Slough. The periwound skin appearance did not exhibit: Callus, Crepitus, Excoriation, Fluctuance, Friable, Induration, Localized Edema, Rash, Scarring, Dry/Scaly, Maceration, Moist, Atrophie Blanche, Cyanosis, Ecchymosis, Hemosiderin Staining, Mottled, Pallor, Rubor, Erythema. Periwound temperature was noted as No Abnormality. The periwound has tenderness on palpation. Assessment Active Problems ICD-10 E11.622 - Type 2 diabetes mellitus with other skin ulcer S81.812A - Laceration without foreign body, left lower leg, initial encounter L97.122 - Non-pressure chronic ulcer of left thigh with fat layer exposed E66.01 - Morbid (severe) obesity due to excess calories I70.242 - Atherosclerosis of native arteries  of left leg with ulceration of calf L97.112 - Non-pressure chronic ulcer of right thigh with fat layer exposed [name], [name] ([id number]) We have addressed the dressing on the new wounds and we will use silver alginate on both sides with the bordered foam dressing and have discussed constant off loading and the appropriate skin prep for her sacrum and her thighs. The left lower extremity will continue to be packed with silver alginate rope. She will see as back next week. Plan Wound Cleansing: Wound #1 Left,Lateral Lower Leg: Clean wound with Normal Saline. Wound #2 Lateral,Posterior Upper Leg: Clean wound with Normal Saline. Wound #3 Right,Posterior Upper Leg: Clean wound with Normal Saline. Anesthetic: Wound #1 Left,Lateral Lower Leg: Topical Lidocaine 4% cream applied to wound bed prior to debridement Wound #2 Lateral,Posterior Upper Leg: Topical Lidocaine 4% cream applied to wound bed prior to debridement Wound #3 Right,Posterior Upper Leg: Topical Lidocaine 4% cream applied to wound bed prior to debridement Skin Barriers/Peri-Wound Care: Wound #1 Left,Lateral Lower Leg: Skin Prep Wound #2 Lateral,Posterior Upper Leg: Skin Prep Wound #3 Right,Posterior Upper Leg: Skin Prep Primary Wound Dressing: Wound #1 Left,Lateral Lower Leg: Aquacel Ag Wound #2 Lateral,Posterior Upper Leg: Aquacel Ag Wound #3 Right,Posterior Upper Leg: Aquacel Ag Secondary Dressing: Wound #1 Left,Lateral Lower Leg: Boardered Foam Dressing Wound #2 Lateral,Posterior Upper Leg: Boardered Foam Dressing Wound #3 Right,Posterior Upper Leg: Boardered Foam Dressing Dressing Change Frequency: Wound #1 Left,Lateral Lower Leg: Change dressing every other day. Wound #2 Lateral,Posterior Upper Leg: Change dressing every other day. Wound #3 Right,Posterior Upper Leg: [name], [name] ([id number]) Change dressing every other day. Follow-up Appointments: Wound #1 Left,Lateral Lower Leg: Return Appointment  in: - 3 weeks Wound #2 Lateral,Posterior Upper Leg: Return Appointment in: - 3 weeks Wound #3 Right,Posterior Upper Leg: Return Appointment in: - 3 weeks Off-Loading: Wound #1 Left,Lateral Lower Leg: Turn and reposition every 2 hours Wound #2 Lateral,Posterior Upper Leg: Turn and reposition every 2 hours Wound #3 Right,Posterior Upper Leg: Turn and reposition every 2 hours Additional Orders / Instructions: Wound #1 Left,Lateral Lower Leg: Increase protein intake. Wound #2 Lateral,Posterior Upper Leg: Increase protein intake. Wound #3 Right,Posterior Upper Leg: Increase protein intake. We have addressed the dressing on the new wounds and we will use silver alginate on both sides with the bordered foam dressing and have discussed constant off loading and the appropriate skin prep for her sacrum and her thighs. The left lower extremity will continue to be packed with silver alginate rope. She will see as back next week. Electronic Signature(s) Signed: 10/03/2014 3:31:21 PM By: Christin Fudge MD, FACS Entered By: Christin Fudge on 10/03/2014 15:31:21 Bascom, [name] ([id number]) -------------------------------------------------------------------------------- SuperBill Details Patient Name: [name], [name] Date of Service: 10/03/2014 Medical Record Number: [id number] Patient Account Number: [id number] Date of Birth/Sex: 08-06-1934 (79 y.o. 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 E66.01 Morbid (severe) obesity due to excess calories I70.242 Atherosclerosis of native arteries of left leg  with ulceration of calf L97.112 Non-pressure chronic ulcer of right thigh with fat layer exposed Facility Procedures CPT4 Code: [id number] Description: 99214 - WOUND CARE VISIT-LEV 4 EST PT Modifier: Quantity: 1 Physician Procedures CPT4 Code Description: [id number] - WC PHYS LEVEL 3 - EST PT ICD-10 Description Diagnosis E11.622 Type 2 diabetes mellitus with other skin ulcer S81.812A Laceration without foreign body, left lower leg, initial L97.112 Non-pressure chronic ulcer of  right thigh with fat layer L97.122 Non-pressure chronic ulcer of left thigh with fat layer Modifier: 25 encounter exposed exposed Quantity: 1 Electronic Signature(s) Signed: 10/03/2014 3:31:38 PM By: Christin Fudge MD, FACS Entered By: Christin Fudge on 10/03/2014 15:31:38

## 2014-10-04 ENCOUNTER — Ambulatory Visit (HOSPITAL_COMMUNITY)
Admission: RE | Admit: 2014-10-04 | Discharge: 2014-10-04 | Disposition: A | Discharge status: Home / Self Care | Payer: Medicare Other | Source: Ambulatory Visit | Attending: Internal Medicine | Admitting: Internal Medicine

## 2014-10-04 ENCOUNTER — Other Ambulatory Visit: Payer: Self-pay | Admitting: Internal Medicine

## 2014-10-04 ENCOUNTER — Encounter (HOSPITAL_COMMUNITY): Payer: Self-pay

## 2014-10-04 DIAGNOSIS — K831 Obstruction of bile duct: Secondary | ICD-10-CM

## 2014-10-04 DIAGNOSIS — N281 Cyst of kidney, acquired: Secondary | ICD-10-CM | POA: Insufficient documentation

## 2014-10-04 DIAGNOSIS — R109 Unspecified abdominal pain: Secondary | ICD-10-CM | POA: Diagnosis not present

## 2014-10-04 DIAGNOSIS — R1110 Vomiting, unspecified: Secondary | ICD-10-CM | POA: Insufficient documentation

## 2014-10-04 DIAGNOSIS — I700 Atherosclerosis of aorta: Secondary | ICD-10-CM | POA: Insufficient documentation

## 2014-10-04 DIAGNOSIS — N200 Calculus of kidney: Secondary | ICD-10-CM | POA: Diagnosis not present

## 2014-10-04 MED ORDER — IOHEXOL 300 MG/ML  SOLN
80.0000 mL | Freq: Once | INTRAMUSCULAR | Status: AC | PRN
  Administered 2014-10-04: 80 mL via INTRAVENOUS

## 2014-10-04 NOTE — Progress Notes (Signed)
TATEShekera, Beavers ([id number]) Visit Report for 10/03/2014 Arrival Information Details Patient Name: Viverette, Naylah A. Date of Service: 10/03/2014 2:30 PM Medical Record Number: [id number] Patient Account Number: [id number] Date of Birth/Sex: January 09, 1935 (80 y.o. Female) Treating RN: Montey Hora Primary Care Physician: Kelton Pillar Other Clinician: Referring Physician: Kelton Pillar Treating Physician/Extender: Frann Rider in Treatment: 7 Visit Information History Since Last Visit Added or deleted any medications: No Patient Arrived: Stretcher Any new allergies or adverse reactions: No Arrival Time: 15:05 Had a fall or experienced change in No Accompanied By: dtr activities of daily living that may affect Transfer Assistance: Manual risk of falls: Patient Identification Verified: Yes Signs or symptoms of abuse/neglect since last No Secondary Verification Process Yes visito Completed: Hospitalized since last visit: No Patient Has Alerts: Yes Pain Present Now: No Patient Alerts: DMII Electronic Signature(s) Signed: 10/03/2014 4:40:04 PM By: Montey Hora Entered By: Montey Hora on 10/03/2014 15:05:23 Ludden, [name] ([id number]) -------------------------------------------------------------------------------- Clinic Level of Care Assessment Details Patient Name: Jacome, Guenevere A. Date of Service: 10/03/2014 2:30 PM Medical Record Number: [id number] Patient Account Number: [id number] Date of Birth/Sex: 07-05-1934 (80 y.o. Female) Treating RN: Montey Hora Primary Care Physician: Kelton Pillar Other Clinician: Referring Physician: Kelton Pillar Treating Physician/Extender: Frann Rider in Treatment: 7 Clinic Level of Care Assessment Items TOOL 4 Quantity Score []  - Use when only an EandM is performed on FOLLOW-UP visit 0 ASSESSMENTS - Nursing Assessment / Reassessment X - Reassessment of Co-morbidities (includes updates in patient status) 1 10 X -  Reassessment of Adherence to Treatment Plan 1 5 ASSESSMENTS - Wound and Skin Assessment / Reassessment []  - Simple Wound Assessment / Reassessment - one wound 0 X - Complex Wound Assessment / Reassessment - multiple wounds 3 5 []  - Dermatologic / Skin Assessment (not related to wound area) 0 ASSESSMENTS - Focused Assessment X - Circumferential Edema Measurements - multi extremities 1 5 []  - Nutritional Assessment / Counseling / Intervention 0 X - Lower Extremity Assessment (monofilament, tuning fork, pulses) 1 5 []  - Peripheral Arterial Disease Assessment (using hand held doppler) 0 ASSESSMENTS - Ostomy and/or Continence Assessment and Care []  - Incontinence Assessment and Management 0 []  - Ostomy Care Assessment and Management (repouching, etc.) 0 PROCESS - Coordination of Care X - Simple Patient / Family Education for ongoing care 1 15 []  - Complex (extensive) Patient / Family Education for ongoing care 0 []  - Staff obtains Programmer, systems, Records, Test Results / Process Orders 0 []  - Staff telephones HHA, Nursing Homes / Clarify orders / etc 0 []  - Routine Transfer to another Facility (non-emergent condition) 0 Muratalla, Sunaina A. ([id number]) []  - Routine Hospital Admission (non-emergent condition) 0 []  - New Admissions / Biomedical engineer / Ordering NPWT, Apligraf, etc. 0 []  - Emergency Hospital Admission (emergent condition) 0 X - Simple Discharge Coordination 1 10 []  - Complex (extensive) Discharge Coordination 0 PROCESS - Special Needs []  - Pediatric / Minor Patient Management 0 []  - Isolation Patient Management 0 []  - Hearing / Language / Visual special needs 0 []  - Assessment of Community assistance (transportation, D/C planning, etc.) 0 []  - Additional assistance / Altered mentation 0 []  - Support Surface(s) Assessment (bed, cushion, seat, etc.) 0 INTERVENTIONS - Wound Cleansing / Measurement []  - Simple Wound Cleansing - one wound 0 X - Complex Wound Cleansing - multiple  wounds 3 5 X - Wound Imaging (photographs - any number of wounds) 1 5 []  - Wound Tracing (instead of photographs) 0 []  -  Simple Wound Measurement - one wound 0 X - Complex Wound Measurement - multiple wounds 3 5 INTERVENTIONS - Wound Dressings X - Small Wound Dressing one or multiple wounds 3 10 []  - Medium Wound Dressing one or multiple wounds 0 []  - Large Wound Dressing one or multiple wounds 0 []  - Application of Medications - topical 0 []  - Application of Medications - injection 0 INTERVENTIONS - Miscellaneous []  - External ear exam 0 Storts, Maple A. ([id number]) []  - Specimen Collection (cultures, biopsies, blood, body fluids, etc.) 0 []  - Specimen(s) / Culture(s) sent or taken to Lab for analysis 0 []  - Patient Transfer (multiple staff / Harrel Lemon Lift / Similar devices) 0 []  - Simple Staple / Suture removal (25 or less) 0 []  - Complex Staple / Suture removal (26 or more) 0 []  - Hypo / Hyperglycemic Management (close monitor of Blood Glucose) 0 []  - Ankle / Brachial Index (ABI) - do not check if billed separately 0 X - Vital Signs 1 5 Has the patient been seen at the hospital within the last three years: Yes Total Score: 135 Level Of Care: New/Established - Level 4 Electronic Signature(s) Signed: 10/03/2014 4:40:04 PM By: Montey Hora Entered By: Montey Hora on 10/03/2014 15:28:23 Quinteros, [name] ([id number]) -------------------------------------------------------------------------------- Encounter Discharge Information Details Patient Name: Detloff, Makinna A. Date of Service: 10/03/2014 2:30 PM Medical Record Number: [id number] Patient Account Number: [id number] Date of Birth/Sex: 1934/03/28 (80 y.o. Female) Treating RN: Montey Hora Primary Care Physician: Kelton Pillar Other Clinician: Referring Physician: Kelton Pillar Treating Physician/Extender: Frann Rider in Treatment: 7 Encounter Discharge Information Items Discharge Pain Level: 0 Discharge Condition:  Stable Ambulatory Status: Stretcher Discharge Destination: Nursing Home Transportation: Ambulance Accompanied By: dtr Schedule Follow-up Appointment: Yes Medication Reconciliation completed and provided to Patient/Care No Lasheba Stevens: Provided on Clinical Summary of Care: 10/03/2014 Form Type Recipient Paper Patient LT Electronic Signature(s) Signed: 10/03/2014 3:40:07 PM By: Ruthine Dose Entered By: Ruthine Dose on 10/03/2014 15:40:07 Tukes, [name] ([id number]) -------------------------------------------------------------------------------- Lower Extremity Assessment Details Patient Name: Mcbryar, Batina A. Date of Service: 10/03/2014 2:30 PM Medical Record Number: [id number] Patient Account Number: [id number] Date of Birth/Sex: March 01, 1934 (80 y.o. Female) Treating RN: Montey Hora Primary Care Physician: Kelton Pillar Other Clinician: Referring Physician: Kelton Pillar Treating Physician/Extender: Frann Rider in Treatment: 7 Edema Assessment Assessed: [Left: No] [Right: No] E[Left: dema] [Right: :] Calf Left: Right: Point of Measurement: 37 cm From Medial Instep 43.5 cm cm Ankle Left: Right: Point of Measurement: 10 cm From Medial Instep 20.2 cm cm Vascular Assessment Pulses: Posterior Tibial Dorsalis Pedis Palpable: [Left:Yes] Extremity colors, hair growth, and conditions: Extremity Color: [Left:Hyperpigmented] Hair Growth on Extremity: [Left:No] Temperature of Extremity: [Left:Warm] Capillary Refill: [Left:< 3 seconds] Electronic Signature(s) Signed: 10/03/2014 4:40:04 PM By: Montey Hora Entered By: Montey Hora on 10/03/2014 15:14:43 Ginsberg, Yoneko A. ([id number]) -------------------------------------------------------------------------------- Multi Wound Chart Details Patient Name: Slee, Alysa A. Date of Service: 10/03/2014 2:30 PM Medical Record Number: [id number] Patient Account Number: [id number] Date of Birth/Sex: 1934-09-21 (80 y.o.  Female) Treating RN: Montey Hora Primary Care Physician: Kelton Pillar Other Clinician: Referring Physician: Kelton Pillar Treating Physician/Extender: Frann Rider in Treatment: 7 Vital Signs Height(in): 61 Pulse(bpm): 79 Weight(lbs): 224 Blood Pressure 112/69 (mmHg): Body Mass Index(BMI): 42 Temperature(F): 98.7 Respiratory Rate 18 (breaths/min): Photos: [1:No Photos] [2:No Photos] [3:No Photos] Wound Location: [1:Left Lower Leg - Lateral Upper Leg - Lateral,] [2:Posterior] [3:Right Upper Leg - Posterior] Wounding Event: [1:Trauma] [2:Trauma] [3:Pressure Injury] Primary Etiology: [1:Trauma, Other] [2:Trauma, Other] [3:Pressure  Ulcer] Comorbid History: [1:Anemia, Congestive Heart Anemia, Congestive Heart Anemia, Congestive Heart Failure, Type II Diabetes, Failure, Type II Diabetes, Failure, Type II Diabetes, Osteoarthritis] [2:Osteoarthritis] [3:Osteoarthritis] Date Acquired: [1:04/26/2014] [2:06/11/2014] [3:09/19/2014] Weeks of Treatment: [1:7] [2:7] [3:0] Wound Status: [1:Open] [2:Open] [3:Open] Clustered Wound: [1:No] [2:No] [3:Yes] Measurements L x W x D 2.2x0.4x1.2 [2:0.6x1.2x0.2] [3:6x3.3x0.2] (cm) Area (cm) : [1:0.691] [2:0.565] [3:15.551] Volume (cm) : [1:0.829] [2:0.113] [3:3.11] % Reduction in Area: [1:56.00%] [2:-99.60%] [3:0.00%] % Reduction in Volume: 47.20% [2:-303.60%] [3:0.00%] Classification: [1:Full Thickness With Exposed Support Structures] [2:Full Thickness Without Exposed Support Structures] [3:Category/Stage III] HBO Classification: [1:Grade 2] [2:Grade 1] [3:Grade 1] Exudate Amount: [1:Medium] [2:Small] [3:Medium] Exudate Type: [1:Serous] [2:Serous] [3:Serous] Exudate Color: [1:amber] [2:amber] [3:amber] Wound Margin: [1:Flat and Intact] [2:Flat and Intact] [3:Flat and Intact] Granulation Amount: [1:Large (67-100%)] [2:Medium (34-66%)] [3:Medium (34-66%)] Granulation Quality: [1:Red] [2:Red] [3:Pink] Necrotic Amount: [1:None Present  (0%)] [2:Medium (34-66%)] [3:Medium (34-66%)] Exposed Structures: Raygoza, Fiza A. ([id number]) Tendon: Yes Fascia: No Fascia: No Fascia: No Fat: No Fat: No Fat: No Tendon: No Tendon: No Muscle: No Muscle: No Muscle: No Joint: No Joint: No Joint: No Bone: No Bone: No Bone: No Limited to Skin Limited to Skin Breakdown Breakdown Epithelialization: None None Medium (34-66%) Periwound Skin Texture: Edema: No Edema: No Edema: No Excoriation: No Excoriation: No Excoriation: No Induration: No Induration: No Induration: No Callus: No Callus: No Callus: No Crepitus: No Crepitus: No Crepitus: No Fluctuance: No Fluctuance: No Fluctuance: No Friable: No Friable: No Friable: No Rash: No Rash: No Rash: No Scarring: No Scarring: No Scarring: No Periwound Skin Moist: Yes Moist: Yes Maceration: No Moisture: Maceration: No Maceration: No Moist: No Dry/Scaly: No Dry/Scaly: No Dry/Scaly: No Periwound Skin Color: Atrophie Blanche: No Atrophie Blanche: No Atrophie Blanche: No Cyanosis: No Cyanosis: No Cyanosis: No Ecchymosis: No Ecchymosis: No Ecchymosis: No Erythema: No Erythema: No Erythema: No Hemosiderin Staining: No Hemosiderin Staining: No Hemosiderin Staining: No Mottled: No Mottled: No Mottled: No Pallor: No Pallor: No Pallor: No Rubor: No Rubor: No Rubor: No Temperature: No Abnormality No Abnormality No Abnormality Tenderness on Yes No Yes Palpation: Wound Preparation: Ulcer Cleansing: Ulcer Cleansing: Ulcer Cleansing: Rinsed/Irrigated with Rinsed/Irrigated with Rinsed/Irrigated with Saline Saline Saline Topical Anesthetic Topical Anesthetic Topical Anesthetic Applied: None Applied: None Applied: None Treatment Notes Electronic Signature(s) Signed: 10/03/2014 4:40:04 PM By: Montey Hora Entered By: Montey Hora on 10/03/2014 15:23:49 Borrero, [name]  ([id number]) -------------------------------------------------------------------------------- [place] Plan Details Patient Name: Taplin, Niylah A. Date of Service: 10/03/2014 2:30 PM Medical Record Number: [id number] Patient Account Number: [id number] Date of Birth/Sex: [birth year] (80 y.o. Female) Treating RN: Montey Hora Primary Care Physician: Kelton Pillar Other Clinician: Referring Physician: Kelton Pillar Treating Physician/Extender: Frann Rider in Treatment: 7 Active Inactive Abuse / Safety / Falls / Self Care Management Nursing Diagnoses: Potential for falls Goals: Patient will remain injury free Date Initiated: 08/13/2014 Goal Status: Active Interventions: Assess fall risk on admission and as needed Notes: Nutrition Nursing Diagnoses: Potential for alteratiion in Nutrition/Potential for imbalanced nutrition Goals: Patient/caregiver verbalizes understanding of need to maintain therapeutic glucose control per primary care physician Date Initiated: 08/13/2014 Goal Status: Active Interventions: Assess patient nutrition upon admission and as needed per policy Notes: Orientation to the Wound Care Program Nursing Diagnoses: Knowledge deficit related to the wound healing center program Goals: Patient/caregiver will verbalize understanding of the [place] ([id number]) Date Initiated: 08/13/2014 Goal Status: Active Interventions: Provide education on orientation to the wound center Notes: Pain, Acute or Chronic Nursing  Diagnoses: Pain, acute or chronic: actual or potential Goals: Patient will verbalize adequate pain control and receive pain control interventions during procedures as needed Date Initiated: 08/13/2014 Goal Status: Active Interventions: Provide education on pain management Notes: Wound/Skin Impairment Nursing Diagnoses: Impaired tissue integrity Goals: Ulcer/skin breakdown will have a volume  reduction of 30% by week 4 Date Initiated: 08/13/2014 Goal Status: Active Interventions: Assess ulceration(s) every visit Notes: Electronic Signature(s) Signed: 10/03/2014 4:40:04 PM By: Montey Hora Entered By: Montey Hora on 10/03/2014 15:23:40 Leisey, [name] ([id number]) -------------------------------------------------------------------------------- Patient/Caregiver Education Details Patient Name: Ringley, Gaspar Skeeters A. Date of Service: 10/03/2014 2:30 PM Medical Record Number: [id number] Patient Account Number: [id number] Date of Birth/Gender: 10-05-1934 (80 y.o. Female) Treating RN: Montey Hora Primary Care Physician: Kelton Pillar Other Clinician: Referring Physician: Kelton Pillar Treating Physician/Extender: Frann Rider in Treatment: 7 Education Assessment Education Provided To: Patient and Caregiver Education Topics Provided Basic Hygiene: Handouts: Other: cut nails so you cannot scratch your wounds Methods: Explain/Verbal Responses: State content correctly Electronic Signature(s) Signed: 10/03/2014 4:40:04 PM By: Montey Hora Entered By: Montey Hora on 10/03/2014 15:26:34 Raso, Nickey A. ([id number]) -------------------------------------------------------------------------------- Wound Assessment Details Patient Name: Summons, Sharan A. Date of Service: 10/03/2014 2:30 PM Medical Record Number: [id number] Patient Account Number: [id number] Date of Birth/Sex: Aug 29, 1934 (80 y.o. Female) Treating RN: Montey Hora Primary Care Physician: Kelton Pillar Other Clinician: Referring Physician: Kelton Pillar Treating Physician/Extender: Frann Rider in Treatment: 7 Wound Status Wound Number: 1 Primary Trauma, Other Etiology: Wound Location: Left Lower Leg - Lateral Wound Open Wounding Event: Trauma Status: Date Acquired: 04/26/2014 Comorbid Anemia, Congestive Heart Failure, Weeks Of Treatment: 7 History: Type II Diabetes,  Osteoarthritis Clustered Wound: No Photos Photo Uploaded By: Gretta Cool, RN, BSN, Kim on 10/03/2014 16:50:06 Wound Measurements Length: (cm) 2.2 % Reduction Width: (cm) 0.4 % Reduction Depth: (cm) 1.2 Epithelializ Area: (cm) 0.691 Tunneling: Volume: (cm) 0.829 Undermining in Area: 56% in Volume: 47.2% ation: None No : No Wound Description Full Thickness With Exposed Foul Odor Af Classification: Support Structures Diabetic Severity Grade 2 (Wagner): Wound Margin: Flat and Intact Exudate Amount: Medium Exudate Type: Serous Exudate Color: amber ter Cleansing: No Wound Bed Granulation Amount: Large (67-100%) Exposed Structure Granulation Quality: Red Fascia Exposed: No Vonada, Laini A. ([id number]) Necrotic Amount: None Present (0%) Fat Layer Exposed: No Tendon Exposed: Yes Muscle Exposed: No Joint Exposed: No Bone Exposed: No Periwound Skin Texture Texture Color No Abnormalities Noted: No No Abnormalities Noted: No Callus: No Atrophie Blanche: No Crepitus: No Cyanosis: No Excoriation: No Ecchymosis: No Fluctuance: No Erythema: No Friable: No Hemosiderin Staining: No Induration: No Mottled: No Localized Edema: No Pallor: No Rash: No Rubor: No Scarring: No Temperature / Pain Moisture Temperature: No Abnormality No Abnormalities Noted: No Tenderness on Palpation: Yes Dry / Scaly: No Maceration: No Moist: Yes Wound Preparation Ulcer Cleansing: Rinsed/Irrigated with Saline Topical Anesthetic Applied: None Treatment Notes Wound #1 (Left, Lateral Lower Leg) 1. Cleansed with: Clean wound with Normal Saline 3. Peri-wound Care: Skin Prep 4. Dressing Applied: Aquacel Ag 5. Secondary Dressing Applied Bordered Foam Dressing Electronic Signature(s) Signed: 10/03/2014 4:40:04 PM By: Montey Hora Entered By: Montey Hora on 10/03/2014 15:20:13 Pontarelli, [name]  ([id number]) -------------------------------------------------------------------------------- Wound Assessment Details Patient Name: Koos, Keslie A. Date of Service: 10/03/2014 2:30 PM Medical Record Number: [id number] Patient Account Number: [id number] Date of Birth/Sex: [birth year] (80 y.o. Female) Treating RN: Montey Hora Primary Care Physician: Kelton Pillar Other Clinician: Referring Physician: Kelton Pillar Treating Physician/Extender: Frann Rider in Treatment: 7 Wound Status  Wound Number: 2 Primary Trauma, Other Etiology: Wound Location: Upper Leg - Lateral, Posterior Wound Open Wounding Event: Trauma Status: Date Acquired: 06/11/2014 Comorbid Anemia, Congestive Heart Failure, Weeks Of Treatment: 7 History: Type II Diabetes, Osteoarthritis Clustered Wound: No Photos Photo Uploaded By: Gretta Cool, RN, BSN, Kim on 10/03/2014 16:38:27 Wound Measurements Length: (cm) 0.6 % Reduction Width: (cm) 1.2 % Reduction Depth: (cm) 0.2 Epithelializ Area: (cm) 0.565 Tunneling: Volume: (cm) 0.113 Undermining in Area: -99.6% in Volume: -303.6% ation: None No : No Wound Description Full Thickness Without Classification: Exposed Support Structures Diabetic Severity Grade 1 (Wagner): Wound Margin: Flat and Intact Exudate Amount: Small Exudate Type: Serous Exudate Color: amber Foul Odor After Cleansing: No Wound Bed Granulation Amount: Medium (34-66%) Exposed Structure Granulation Quality: Red Fascia Exposed: No Nauta, Blessen A. ([id number]) Necrotic Amount: Medium (34-66%) Fat Layer Exposed: No Necrotic Quality: Adherent Slough Tendon Exposed: No Muscle Exposed: No Joint Exposed: No Bone Exposed: No Limited to Skin Breakdown Periwound Skin Texture Texture Color No Abnormalities Noted: No No Abnormalities Noted: No Callus: No Atrophie Blanche: No Crepitus: No Cyanosis: No Excoriation: No Ecchymosis: No Fluctuance: No Erythema: No Friable:  No Hemosiderin Staining: No Induration: No Mottled: No Localized Edema: No Pallor: No Rash: No Rubor: No Scarring: No Temperature / Pain Moisture Temperature: No Abnormality No Abnormalities Noted: No Dry / Scaly: No Maceration: No Moist: Yes Wound Preparation Ulcer Cleansing: Rinsed/Irrigated with Saline Topical Anesthetic Applied: None Treatment Notes Wound #2 (Lateral, Posterior Upper Leg) 1. Cleansed with: Clean wound with Normal Saline 3. Peri-wound Care: Skin Prep 4. Dressing Applied: Aquacel Ag 5. Secondary Dressing Applied Bordered Foam Dressing Electronic Signature(s) Signed: 10/03/2014 4:40:04 PM By: Montey Hora Entered By: Montey Hora on 10/03/2014 15:20:02 Charpentier, [name] ([id number]) -------------------------------------------------------------------------------- Wound Assessment Details Patient Name: Wolfrey, Ercilia A. Date of Service: 10/03/2014 2:30 PM Medical Record Number: [id number] Patient Account Number: [id number] Date of Birth/Sex: [birth year] (80 y.o. Female) Treating RN: Montey Hora Primary Care Physician: Kelton Pillar Other Clinician: Referring Physician: Kelton Pillar Treating Physician/Extender: Frann Rider in Treatment: 7 Wound Status Wound Number: 3 Primary Pressure Ulcer Etiology: Wound Location: Right Upper Leg - Posterior Wound Open Wounding Event: Pressure Injury Status: Date Acquired: 09/19/2014 Comorbid Anemia, Congestive Heart Failure, Weeks Of Treatment: 0 History: Type II Diabetes, Osteoarthritis Clustered Wound: Yes Photos Photo Uploaded By: Gretta Cool, RN, BSN, Kim on 10/03/2014 16:39:01 Wound Measurements Length: (cm) 6 Width: (cm) 3.3 Depth: (cm) 0.2 Area: (cm) 15.551 Volume: (cm) 3.11 % Reduction in Area: 0% % Reduction in Volume: 0% Epithelialization: Medium (34-66%) Tunneling: No Undermining: No Wound Description Classification: Category/Stage III Foul Odor A Diabetic Severity (Wagner):  Grade 1 Wound Margin: Flat and Intact Exudate Amount: Medium Exudate Type: Serous Exudate Color: amber fter Cleansing: No Wound Bed Granulation Amount: Medium (34-66%) Exposed Structure Granulation Quality: Pink Fascia Exposed: No Necrotic Amount: Medium (34-66%) Fat Layer Exposed: No Mcelmurry, Kevin A. ([id number]) Necrotic Quality: Adherent Slough Tendon Exposed: No Muscle Exposed: No Joint Exposed: No Bone Exposed: No Limited to Skin Breakdown Periwound Skin Texture Texture Color No Abnormalities Noted: No No Abnormalities Noted: No Callus: No Atrophie Blanche: No Crepitus: No Cyanosis: No Excoriation: No Ecchymosis: No Fluctuance: No Erythema: No Friable: No Hemosiderin Staining: No Induration: No Mottled: No Localized Edema: No Pallor: No Rash: No Rubor: No Scarring: No Temperature / Pain Moisture Temperature: No Abnormality No Abnormalities Noted: No Tenderness on Palpation: Yes Dry / Scaly: No Maceration: No Moist: No Wound Preparation Ulcer Cleansing: Rinsed/Irrigated with Saline Topical Anesthetic Applied:  None Treatment Notes Wound #3 (Right, Posterior Upper Leg) 1. Cleansed with: Clean wound with Normal Saline 3. Peri-wound Care: Skin Prep 4. Dressing Applied: Aquacel Ag 5. Secondary Dressing Applied Bordered Foam Dressing Electronic Signature(s) Signed: 10/03/2014 4:40:04 PM By: Montey Hora Entered By: Montey Hora on 10/03/2014 15:21:37 Ravi, [name] ([id number]) -------------------------------------------------------------------------------- Vitals Details Patient Name: Pallett, Kathalene A. Date of Service: 10/03/2014 2:30 PM Medical Record Number: [id number] Patient Account Number: [id number] Date of Birth/Sex: 08-24-1934 (80 y.o. Female) Treating RN: Montey Hora Primary Care Physician: Kelton Pillar Other Clinician: Referring Physician: Kelton Pillar Treating Physician/Extender: Frann Rider in Treatment: 7 Vital  Signs Time Taken: 15:05 Temperature (F): 98.7 Height (in): 61 Pulse (bpm): 79 Weight (lbs): 224 Respiratory Rate (breaths/min): 18 Body Mass Index (BMI): 42.3 Blood Pressure (mmHg): 112/69 Reference Range: 80 - 120 mg / dl Electronic Signature(s) Signed: 10/03/2014 4:40:04 PM By: Montey Hora Entered By: Montey Hora on 10/03/2014 15:05:51

## 2014-10-09 ENCOUNTER — Ambulatory Visit (HOSPITAL_COMMUNITY): Payer: Medicare Other

## 2014-10-09 ENCOUNTER — Telehealth: Payer: Self-pay | Admitting: Cardiology

## 2014-10-09 NOTE — Telephone Encounter (Signed)
Returned call to Mehan, left msg on verified VM.

## 2014-10-09 NOTE — Telephone Encounter (Signed)
Theadora Rama is calling from [facility] because she wanting to get Dr. Percival Spanish Consult to see what his suggestion some changes that the patient is going .Marland Kitchen Please call  Thanks

## 2014-10-09 NOTE — Telephone Encounter (Signed)
Spoke w/ Cletus Gash, NP. She has recently made changes to patient's daily meds in effort to address some issues.  Noted that patient's prednisone dose was cut (from 20mg ?) to 10mg  daily w/ goal to continue taper. Also noted that patient's demadex and supp potassium was held starting this AM d/t concern over elevated creatinine. She plans to have labs rechecked Friday and will resume meds then if OK or sooner if patient requires for fluid excess concerns.  She called here as courtesy to [name] to keep Dr. Percival Spanish informed of changes. Will route for his knowledge and review.

## 2014-10-23 ENCOUNTER — Encounter: Payer: Self-pay | Admitting: Vascular Surgery

## 2014-10-24 ENCOUNTER — Ambulatory Visit: Payer: Medicare Other | Admitting: Surgery

## 2014-10-25 ENCOUNTER — Ambulatory Visit (HOSPITAL_COMMUNITY)
Admission: RE | Admit: 2014-10-25 | Discharge: 2014-10-25 | Disposition: A | Discharge status: Home / Self Care | Payer: Medicare Other | Source: Ambulatory Visit | Attending: Vascular Surgery | Admitting: Vascular Surgery

## 2014-10-25 ENCOUNTER — Encounter: Payer: Self-pay | Admitting: Vascular Surgery

## 2014-10-25 ENCOUNTER — Ambulatory Visit (INDEPENDENT_AMBULATORY_CARE_PROVIDER_SITE_OTHER): Payer: Medicare Other | Admitting: Vascular Surgery

## 2014-10-25 VITALS — BP 105/69 | HR 72 | Ht 62.0 in | Wt 218.0 lb

## 2014-10-25 DIAGNOSIS — I70209 Unspecified atherosclerosis of native arteries of extremities, unspecified extremity: Secondary | ICD-10-CM

## 2014-10-25 DIAGNOSIS — L98499 Non-pressure chronic ulcer of skin of other sites with unspecified severity: Secondary | ICD-10-CM

## 2014-10-25 DIAGNOSIS — I739 Peripheral vascular disease, unspecified: Secondary | ICD-10-CM | POA: Diagnosis not present

## 2014-10-25 DIAGNOSIS — Z9862 Peripheral vascular angioplasty status: Secondary | ICD-10-CM | POA: Diagnosis not present

## 2014-10-25 DIAGNOSIS — Z9889 Other specified postprocedural states: Secondary | ICD-10-CM | POA: Diagnosis not present

## 2014-10-25 NOTE — Progress Notes (Signed)
Postoperative Visit   History of Present Illness  [name] is a 80 y.o. female who presents for postoperative follow-up from procedure on Date: 09/25/14: PTA L SFA and peroneal artery .  The patient's wounds are healing.  Family member notes the wound has contracted from quarter size to the current size.  The patient is not ambulatory so she does not have intermittent claudication.  She denies rest pain.    Past Medical History, Past Surgical History, Social History, Family History, Medications, Allergies, and Review of Systems are unchanged from previous evaluation on 09/25/14.  Current Outpatient Prescriptions  Medication Sig Dispense Refill  . albuterol (PROVENTIL) (2.5 MG/3ML) 0.083% nebulizer solution Take 2.5 mg by nebulization every 4 (four) hours as needed for wheezing or shortness of breath (For wheezing and shortness of breath).     . Amino Acids-Protein Hydrolys (FEEDING SUPPLEMENT, PRO-STAT SUGAR FREE 64,) LIQD Take 30 mLs by mouth 2 (two) times daily.    Marland Kitchen atorvastatin (LIPITOR) 40 MG tablet Take 40 mg by mouth daily.    . bisacodyl (DULCOLAX) 10 MG suppository Place 10 mg rectally daily as needed for moderate constipation (if constipation not relieved by MOM).    . carvedilol (COREG) 3.125 MG tablet Take 3.125 mg by mouth 2 (two) times daily with a meal. Hold if SBP <100    . clopidogrel (PLAVIX) 75 MG tablet Take 1 tablet (75 mg total) by mouth daily. 30 tablet 0  . ferrous sulfate 325 (65 FE) MG tablet Take 325 mg by mouth daily.    Marland Kitchen HYDROcodone-acetaminophen (NORCO/VICODIN) 5-325 MG per tablet Take 1 tablet by mouth 3 (three) times daily as needed (pain). 15 tablet 0  . Insulin Glargine (TOUJEO SOLOSTAR) 300 UNIT/ML SOPN Inject 10 Units into the skin at bedtime.    . insulin lispro (HUMALOG KWIKPEN) 100 UNIT/ML KiwkPen Inject 0-10 Units into the skin 4 (four) times daily -  before meals and at bedtime. 150 or less= 0 units 151-200= 2 units 201-250= 4 units 251-300= 6  units 301-350= 8 units 351-400= 10 units    . lactulose (CHRONULAC) 10 GM/15ML solution Take 10 g by mouth daily.    Marland Kitchen loratadine (CLARITIN) 10 MG tablet Take 10 mg by mouth daily.    Marland Kitchen lubiprostone (AMITIZA) 24 MCG capsule Take 1 capsule (24 mcg total) by mouth 2 (two) times daily with a meal. (Patient taking differently: Take 24 mcg by mouth 2 (two) times daily with a meal. For constipation) 30 capsule 0  . metolazone (ZAROXOLYN) 2.5 MG tablet Take 2.5 mg by mouth once a week. Weekly on Wed    . pantoprazole (PROTONIX) 40 MG tablet Take 1 tablet (40 mg total) by mouth 2 (two) times daily. (Patient taking differently: Take 40 mg by mouth daily. For GERD)    . polyethylene glycol (MIRALAX / GLYCOLAX) packet Take 17 g by mouth daily.    . potassium chloride SA (K-DUR,KLOR-CON) 20 MEQ tablet Take 1 tablet (20 mEq total) by mouth daily.    . predniSONE (DELTASONE) 10 MG tablet Take 10 mg by mouth daily with breakfast.     . sennosides-docusate sodium (SENOKOT-S) 8.6-50 MG tablet Take 1 tablet by mouth daily.    . Sodium Phosphates (RA SALINE ENEMA RE) Place 1 each rectally daily as needed (constipation).    . torsemide (DEMADEX) 20 MG tablet Take 20 mg by mouth 2 (two) times daily.     . traMADol (ULTRAM) 50 MG tablet Take 1  tablet (50 mg total) by mouth every 8 (eight) hours. 20 tablet 5   No current facility-administered medications for this visit.    For VQI Use Only  PRE-ADM LIVING: Nursing home  AMB STATUS: Bedridden  Physical Examination  Filed Vitals:   10/25/14 0947  BP: 105/69  Pulse: 72  Height: 5\' 2"  (1.575 m)  Weight: 218 lb (98.884 kg)  SpO2: 100%   Body mass index is 39.86 kg/(m^2).  General: A&O x 3, WD, obese  Pulmonary: Sym exp, good air movt, CTAB, no rales, rhonchi, & wheezing  Cardiac: RRR, Nl S1, S2, no Murmurs, rubs or gallops  Vascular: Vessel Right Left  Radial Palpable Palpable  Ulnar Palpable Palpable  Brachial Palpable Palpable  Carotid  Palpable, without bruit Palpable, without bruit  Aorta Not palpable N/A  Femoral Palpable Palpable  Popliteal Not palpable Not palpable  PT Palpable Palpable  DP Palpable Palpable    Gastrointestinal: soft, NTND, -G/R, - HSM, - masses, - CVAT B  Musculoskeletal: no pseudoaneurysm or hematoma at cannulation site, L lateral ulcer contracted from prior appearance, depth of ulcer also decreased, no bleeding, no odor  Neurologic:  Pain and light touch intact in extremities , Motor exam as listed above  Medical Decision Making  [name] is a 80 y.o. female who presents s/p L SFA and peroneal PTA. Based on his angiographic findings, this patient needs: q3 ABI and continued wound care. I discussed in depth with the patient the nature of atherosclerosis, and emphasized the importance of maximal medical management including strict control of blood pressure, blood glucose, and lipid levels, obtaining regular exercise, and cessation of smoking.  The patient is aware that without maximal medical management the underlying atherosclerotic disease process will progress, limiting the benefit of any interventions. The patient is currently not on a statin: defer to her PCP. The patient is currently on an anti-platelet: Plavix.  She can be stop at this point if she develops any GI bleeding.  Thank you for allowing [name] to participate in this patient's care.  Adele Barthel, MD [facility] Office: [phone] Pager: [phone]

## 2014-10-25 NOTE — Addendum Note (Signed)
Addended by: Dorthula Rue L on: 10/25/2014 02:29 PM   Modules accepted: Orders

## 2014-10-29 ENCOUNTER — Other Ambulatory Visit: Payer: Self-pay | Admitting: Internal Medicine

## 2014-10-29 ENCOUNTER — Ambulatory Visit (HOSPITAL_COMMUNITY)
Admission: RE | Admit: 2014-10-29 | Discharge: 2014-10-29 | Disposition: A | Discharge status: Home / Self Care | Payer: Medicare Other | Source: Ambulatory Visit | Attending: Internal Medicine | Admitting: Internal Medicine

## 2014-10-29 DIAGNOSIS — R52 Pain, unspecified: Secondary | ICD-10-CM

## 2014-10-29 DIAGNOSIS — N10 Acute tubulo-interstitial nephritis: Secondary | ICD-10-CM | POA: Diagnosis not present

## 2014-10-29 DIAGNOSIS — E860 Dehydration: Secondary | ICD-10-CM | POA: Diagnosis not present

## 2014-10-31 ENCOUNTER — Ambulatory Visit: Payer: Medicare Other | Admitting: Surgery

## 2014-11-01 ENCOUNTER — Inpatient Hospital Stay (HOSPITAL_COMMUNITY)
Admission: EM | Admit: 2014-11-01 | Discharge: 2014-11-08 | DRG: 689 | Disposition: A | Discharge status: Home Health | Payer: Medicare Other | Attending: Internal Medicine | Admitting: Internal Medicine

## 2014-11-01 ENCOUNTER — Encounter (HOSPITAL_COMMUNITY): Payer: Self-pay | Admitting: Emergency Medicine

## 2014-11-01 ENCOUNTER — Emergency Department (HOSPITAL_COMMUNITY): Payer: Medicare Other

## 2014-11-01 DIAGNOSIS — Z881 Allergy status to other antibiotic agents status: Secondary | ICD-10-CM

## 2014-11-01 DIAGNOSIS — I5032 Chronic diastolic (congestive) heart failure: Secondary | ICD-10-CM

## 2014-11-01 DIAGNOSIS — D509 Iron deficiency anemia, unspecified: Secondary | ICD-10-CM | POA: Diagnosis present

## 2014-11-01 DIAGNOSIS — H9190 Unspecified hearing loss, unspecified ear: Secondary | ICD-10-CM | POA: Diagnosis present

## 2014-11-01 DIAGNOSIS — I700 Atherosclerosis of aorta: Secondary | ICD-10-CM | POA: Diagnosis present

## 2014-11-01 DIAGNOSIS — E119 Type 2 diabetes mellitus without complications: Secondary | ICD-10-CM

## 2014-11-01 DIAGNOSIS — N993 Prolapse of vaginal vault after hysterectomy: Secondary | ICD-10-CM | POA: Diagnosis present

## 2014-11-01 DIAGNOSIS — I739 Peripheral vascular disease, unspecified: Secondary | ICD-10-CM | POA: Diagnosis not present

## 2014-11-01 DIAGNOSIS — N10 Acute tubulo-interstitial nephritis: Secondary | ICD-10-CM | POA: Diagnosis present

## 2014-11-01 DIAGNOSIS — N183 Chronic kidney disease, stage 3 unspecified: Secondary | ICD-10-CM | POA: Diagnosis present

## 2014-11-01 DIAGNOSIS — R109 Unspecified abdominal pain: Secondary | ICD-10-CM | POA: Diagnosis present

## 2014-11-01 DIAGNOSIS — Z6841 Body Mass Index (BMI) 40.0 and over, adult: Secondary | ICD-10-CM | POA: Diagnosis not present

## 2014-11-01 DIAGNOSIS — I5030 Unspecified diastolic (congestive) heart failure: Secondary | ICD-10-CM | POA: Diagnosis present

## 2014-11-01 DIAGNOSIS — N12 Tubulo-interstitial nephritis, not specified as acute or chronic: Secondary | ICD-10-CM | POA: Diagnosis present

## 2014-11-01 DIAGNOSIS — E11649 Type 2 diabetes mellitus with hypoglycemia without coma: Secondary | ICD-10-CM | POA: Diagnosis present

## 2014-11-01 DIAGNOSIS — K5900 Constipation, unspecified: Secondary | ICD-10-CM | POA: Diagnosis present

## 2014-11-01 DIAGNOSIS — Z880 Allergy status to penicillin: Secondary | ICD-10-CM | POA: Diagnosis not present

## 2014-11-01 DIAGNOSIS — N179 Acute kidney failure, unspecified: Secondary | ICD-10-CM | POA: Diagnosis present

## 2014-11-01 DIAGNOSIS — J189 Pneumonia, unspecified organism: Secondary | ICD-10-CM | POA: Insufficient documentation

## 2014-11-01 DIAGNOSIS — Z8719 Personal history of other diseases of the digestive system: Secondary | ICD-10-CM

## 2014-11-01 DIAGNOSIS — E860 Dehydration: Secondary | ICD-10-CM | POA: Diagnosis present

## 2014-11-01 DIAGNOSIS — Z79891 Long term (current) use of opiate analgesic: Secondary | ICD-10-CM

## 2014-11-01 DIAGNOSIS — Z87440 Personal history of urinary (tract) infections: Secondary | ICD-10-CM | POA: Diagnosis not present

## 2014-11-01 DIAGNOSIS — E785 Hyperlipidemia, unspecified: Secondary | ICD-10-CM | POA: Diagnosis present

## 2014-11-01 DIAGNOSIS — Z7902 Long term (current) use of antithrombotics/antiplatelets: Secondary | ICD-10-CM

## 2014-11-01 DIAGNOSIS — Z887 Allergy status to serum and vaccine status: Secondary | ICD-10-CM | POA: Diagnosis not present

## 2014-11-01 DIAGNOSIS — Z794 Long term (current) use of insulin: Secondary | ICD-10-CM

## 2014-11-01 DIAGNOSIS — I7025 Atherosclerosis of native arteries of other extremities with ulceration: Secondary | ICD-10-CM | POA: Diagnosis present

## 2014-11-01 DIAGNOSIS — E1151 Type 2 diabetes mellitus with diabetic peripheral angiopathy without gangrene: Secondary | ICD-10-CM | POA: Diagnosis present

## 2014-11-01 DIAGNOSIS — E872 Acidosis: Secondary | ICD-10-CM | POA: Diagnosis present

## 2014-11-01 DIAGNOSIS — E876 Hypokalemia: Secondary | ICD-10-CM | POA: Diagnosis present

## 2014-11-01 DIAGNOSIS — J9611 Chronic respiratory failure with hypoxia: Secondary | ICD-10-CM | POA: Diagnosis present

## 2014-11-01 DIAGNOSIS — I5042 Chronic combined systolic (congestive) and diastolic (congestive) heart failure: Secondary | ICD-10-CM | POA: Diagnosis present

## 2014-11-01 DIAGNOSIS — L97829 Non-pressure chronic ulcer of other part of left lower leg with unspecified severity: Secondary | ICD-10-CM | POA: Diagnosis present

## 2014-11-01 DIAGNOSIS — Z9981 Dependence on supplemental oxygen: Secondary | ICD-10-CM | POA: Diagnosis not present

## 2014-11-01 DIAGNOSIS — I481 Persistent atrial fibrillation: Secondary | ICD-10-CM | POA: Diagnosis not present

## 2014-11-01 DIAGNOSIS — K219 Gastro-esophageal reflux disease without esophagitis: Secondary | ICD-10-CM | POA: Diagnosis present

## 2014-11-01 DIAGNOSIS — Z515 Encounter for palliative care: Secondary | ICD-10-CM | POA: Diagnosis not present

## 2014-11-01 DIAGNOSIS — E871 Hypo-osmolality and hyponatremia: Secondary | ICD-10-CM | POA: Diagnosis present

## 2014-11-01 DIAGNOSIS — L98499 Non-pressure chronic ulcer of skin of other sites with unspecified severity: Secondary | ICD-10-CM

## 2014-11-01 DIAGNOSIS — Z7189 Other specified counseling: Secondary | ICD-10-CM | POA: Diagnosis not present

## 2014-11-01 DIAGNOSIS — E11622 Type 2 diabetes mellitus with other skin ulcer: Secondary | ICD-10-CM | POA: Diagnosis present

## 2014-11-01 DIAGNOSIS — N390 Urinary tract infection, site not specified: Secondary | ICD-10-CM | POA: Diagnosis present

## 2014-11-01 DIAGNOSIS — E1122 Type 2 diabetes mellitus with diabetic chronic kidney disease: Secondary | ICD-10-CM | POA: Diagnosis present

## 2014-11-01 DIAGNOSIS — G9340 Encephalopathy, unspecified: Secondary | ICD-10-CM | POA: Diagnosis not present

## 2014-11-01 DIAGNOSIS — M1990 Unspecified osteoarthritis, unspecified site: Secondary | ICD-10-CM | POA: Diagnosis present

## 2014-11-01 DIAGNOSIS — Z7401 Bed confinement status: Secondary | ICD-10-CM | POA: Diagnosis not present

## 2014-11-01 DIAGNOSIS — R4182 Altered mental status, unspecified: Secondary | ICD-10-CM

## 2014-11-01 DIAGNOSIS — Z720 Tobacco use: Secondary | ICD-10-CM

## 2014-11-01 DIAGNOSIS — Z79899 Other long term (current) drug therapy: Secondary | ICD-10-CM

## 2014-11-01 DIAGNOSIS — Z7952 Long term (current) use of systemic steroids: Secondary | ICD-10-CM

## 2014-11-01 DIAGNOSIS — R627 Adult failure to thrive: Secondary | ICD-10-CM | POA: Diagnosis present

## 2014-11-01 DIAGNOSIS — Z91040 Latex allergy status: Secondary | ICD-10-CM | POA: Diagnosis not present

## 2014-11-01 DIAGNOSIS — I340 Nonrheumatic mitral (valve) insufficiency: Secondary | ICD-10-CM | POA: Diagnosis present

## 2014-11-01 DIAGNOSIS — T502X5A Adverse effect of carbonic-anhydrase inhibitors, benzothiadiazides and other diuretics, initial encounter: Secondary | ICD-10-CM | POA: Diagnosis present

## 2014-11-01 DIAGNOSIS — K5909 Other constipation: Secondary | ICD-10-CM | POA: Diagnosis present

## 2014-11-01 DIAGNOSIS — I4891 Unspecified atrial fibrillation: Secondary | ICD-10-CM | POA: Diagnosis present

## 2014-11-01 DIAGNOSIS — R079 Chest pain, unspecified: Secondary | ICD-10-CM | POA: Diagnosis not present

## 2014-11-01 DIAGNOSIS — I70209 Unspecified atherosclerosis of native arteries of extremities, unspecified extremity: Secondary | ICD-10-CM | POA: Diagnosis present

## 2014-11-01 DIAGNOSIS — N8110 Cystocele, unspecified: Secondary | ICD-10-CM | POA: Diagnosis present

## 2014-11-01 HISTORY — DX: Gastrointestinal hemorrhage, unspecified: K92.2

## 2014-11-01 HISTORY — DX: Personal history of other medical treatment: Z92.89

## 2014-11-01 HISTORY — DX: Calculus of kidney: N20.0

## 2014-11-01 HISTORY — DX: Urinary tract infection, site not specified: N39.0

## 2014-11-01 HISTORY — DX: Chronic kidney disease, stage 2 (mild): N18.2

## 2014-11-01 LAB — HEPATIC FUNCTION PANEL
ALT: 19 U/L (ref 14–54)
AST: 17 U/L (ref 15–41)
Albumin: 2.8 g/dL — ABNORMAL LOW (ref 3.5–5.0)
Alkaline Phosphatase: 123 U/L (ref 38–126)
BILIRUBIN DIRECT: 0.2 mg/dL (ref 0.1–0.5)
BILIRUBIN INDIRECT: 0.3 mg/dL (ref 0.3–0.9)
Total Bilirubin: 0.5 mg/dL (ref 0.3–1.2)
Total Protein: 5.5 g/dL — ABNORMAL LOW (ref 6.5–8.1)

## 2014-11-01 LAB — TYPE AND SCREEN
ABO/RH(D): O POS
ANTIBODY SCREEN: NEGATIVE

## 2014-11-01 LAB — CBC
HCT: 28.6 % — ABNORMAL LOW (ref 36.0–46.0)
HEMOGLOBIN: 9.1 g/dL — AB (ref 12.0–15.0)
MCH: 32.9 pg (ref 26.0–34.0)
MCHC: 31.8 g/dL (ref 30.0–36.0)
MCV: 103.2 fL — AB (ref 78.0–100.0)
Platelets: 283 10*3/uL (ref 150–400)
RBC: 2.77 MIL/uL — ABNORMAL LOW (ref 3.87–5.11)
RDW: 16.8 % — ABNORMAL HIGH (ref 11.5–15.5)
WBC: 9.2 10*3/uL (ref 4.0–10.5)

## 2014-11-01 LAB — URINALYSIS, ROUTINE W REFLEX MICROSCOPIC
Glucose, UA: NEGATIVE mg/dL
KETONES UR: NEGATIVE mg/dL
NITRITE: NEGATIVE
PH: 5 (ref 5.0–8.0)
Protein, ur: 30 mg/dL — AB
Specific Gravity, Urine: 1.027 (ref 1.005–1.030)
UROBILINOGEN UA: 0.2 mg/dL (ref 0.0–1.0)

## 2014-11-01 LAB — CBC WITH DIFFERENTIAL/PLATELET
BASOS PCT: 0 %
Basophils Absolute: 0 10*3/uL (ref 0.0–0.1)
EOS PCT: 1 %
Eosinophils Absolute: 0.1 10*3/uL (ref 0.0–0.7)
HEMATOCRIT: 30.8 % — AB (ref 36.0–46.0)
Hemoglobin: 9.7 g/dL — ABNORMAL LOW (ref 12.0–15.0)
LYMPHS PCT: 13 %
Lymphs Abs: 1.3 10*3/uL (ref 0.7–4.0)
MCH: 32.2 pg (ref 26.0–34.0)
MCHC: 31.5 g/dL (ref 30.0–36.0)
MCV: 102.3 fL — AB (ref 78.0–100.0)
MONO ABS: 0.5 10*3/uL (ref 0.1–1.0)
MONOS PCT: 5 %
NEUTROS ABS: 8 10*3/uL — AB (ref 1.7–7.7)
Neutrophils Relative %: 81 %
Platelets: 316 10*3/uL (ref 150–400)
RBC: 3.01 MIL/uL — ABNORMAL LOW (ref 3.87–5.11)
RDW: 16.8 % — AB (ref 11.5–15.5)
WBC: 9.9 10*3/uL (ref 4.0–10.5)

## 2014-11-01 LAB — TROPONIN I
Troponin I: 0.05 ng/mL — ABNORMAL HIGH (ref <0.031)
Troponin I: <0.03 ng/mL (ref <0.031)

## 2014-11-01 LAB — GLUCOSE, CAPILLARY: Glucose-Capillary: 77 mg/dL (ref 65–99)

## 2014-11-01 LAB — I-STAT CHEM 8, ED
BUN: 26 mg/dL — ABNORMAL HIGH (ref 6–20)
CALCIUM ION: 1.05 mmol/L — AB (ref 1.13–1.30)
CREATININE: 1.1 mg/dL — AB (ref 0.44–1.00)
Chloride: 92 mmol/L — ABNORMAL LOW (ref 101–111)
GLUCOSE: 85 mg/dL (ref 65–99)
HCT: 32 % — ABNORMAL LOW (ref 36.0–46.0)
HEMOGLOBIN: 10.9 g/dL — AB (ref 12.0–15.0)
Potassium: 4.1 mmol/L (ref 3.5–5.1)
Sodium: 126 mmol/L — ABNORMAL LOW (ref 135–145)
TCO2: 25 mmol/L (ref 0–100)

## 2014-11-01 LAB — APTT: aPTT: 31 seconds (ref 24–37)

## 2014-11-01 LAB — URINE MICROSCOPIC-ADD ON

## 2014-11-01 LAB — PROTIME-INR
INR: 1.08 (ref 0.00–1.49)
PROTHROMBIN TIME: 14.2 s (ref 11.6–15.2)

## 2014-11-01 LAB — LACTIC ACID, PLASMA
Lactic Acid, Venous: 1.5 mmol/L (ref 0.5–2.0)
Lactic Acid, Venous: 1.7 mmol/L (ref 0.5–2.0)

## 2014-11-01 LAB — I-STAT TROPONIN, ED: Troponin i, poc: 0.01 ng/mL (ref 0.00–0.08)

## 2014-11-01 LAB — LIPASE, BLOOD: LIPASE: 16 U/L — AB (ref 22–51)

## 2014-11-01 LAB — PROCALCITONIN: PROCALCITONIN: 0.23 ng/mL

## 2014-11-01 LAB — AMMONIA: AMMONIA: 26 umol/L (ref 9–35)

## 2014-11-01 LAB — MRSA PCR SCREENING: MRSA BY PCR: NEGATIVE

## 2014-11-01 MED ORDER — HYDROCODONE-ACETAMINOPHEN 5-325 MG PO TABS
1.0000 | ORAL_TABLET | Freq: Three times a day (TID) | ORAL | Status: DC
  Administered 2014-11-01 – 2014-11-04 (×8): 1 via ORAL
  Filled 2014-11-01 (×8): qty 1

## 2014-11-01 MED ORDER — SIMETHICONE 80 MG PO CHEW: 80.0000 mg | CHEWABLE_TABLET | Freq: Four times a day (QID) | ORAL | Status: DC | PRN

## 2014-11-01 MED ORDER — LACTULOSE 10 GM/15ML PO SOLN
10.0000 g | Freq: Two times a day (BID) | ORAL | Status: DC
  Administered 2014-11-01 – 2014-11-06 (×8): 10 g via ORAL
  Filled 2014-11-01 (×13): qty 15

## 2014-11-01 MED ORDER — CHLORHEXIDINE GLUCONATE 0.12 % MT SOLN
15.0000 mL | Freq: Two times a day (BID) | OROMUCOSAL | Status: DC
  Administered 2014-11-01 – 2014-11-07 (×12): 15 mL via OROMUCOSAL
  Filled 2014-11-01 (×12): qty 15

## 2014-11-01 MED ORDER — BISACODYL 10 MG RE SUPP
10.0000 mg | Freq: Every day | RECTAL | Status: DC | PRN
  Administered 2014-11-07: 10 mg via RECTAL
  Filled 2014-11-01: qty 1

## 2014-11-01 MED ORDER — ACETAMINOPHEN 650 MG RE SUPP
650.0000 mg | Freq: Four times a day (QID) | RECTAL | Status: DC | PRN
  Administered 2014-11-07 (×3): 650 mg via RECTAL
  Filled 2014-11-01 (×4): qty 1

## 2014-11-01 MED ORDER — PRO-STAT SUGAR FREE PO LIQD
30.0000 mL | Freq: Two times a day (BID) | ORAL | Status: DC
  Administered 2014-11-01 – 2014-11-06 (×8): 30 mL via ORAL
  Filled 2014-11-01 (×11): qty 30

## 2014-11-01 MED ORDER — SENNA-DOCUSATE SODIUM 8.6-50 MG PO TABS: 1.0000 | ORAL_TABLET | Freq: Every day | ORAL | Status: DC

## 2014-11-01 MED ORDER — POLYETHYLENE GLYCOL 3350 17 G PO PACK: 17.0000 g | PACK | Freq: Every day | ORAL | Status: DC | PRN

## 2014-11-01 MED ORDER — ATORVASTATIN CALCIUM 40 MG PO TABS
40.0000 mg | ORAL_TABLET | Freq: Every day | ORAL | Status: DC
  Administered 2014-11-02 – 2014-11-05 (×4): 40 mg via ORAL
  Filled 2014-11-01 (×5): qty 1

## 2014-11-01 MED ORDER — CARVEDILOL 3.125 MG PO TABS
3.1250 mg | ORAL_TABLET | Freq: Two times a day (BID) | ORAL | Status: DC
  Administered 2014-11-02 – 2014-11-08 (×7): 3.125 mg via ORAL
  Filled 2014-11-01 (×12): qty 1

## 2014-11-01 MED ORDER — HYDROCORTISONE ACE-PRAMOXINE 1-1 % RE FOAM
1.0000 | Freq: Two times a day (BID) | RECTAL | Status: DC
  Administered 2014-11-01 – 2014-11-08 (×12): 1 via RECTAL
  Filled 2014-11-01 (×3): qty 10

## 2014-11-01 MED ORDER — ALBUTEROL SULFATE (2.5 MG/3ML) 0.083% IN NEBU: 2.5000 mg | INHALATION_SOLUTION | RESPIRATORY_TRACT | Status: DC | PRN

## 2014-11-01 MED ORDER — SENNOSIDES-DOCUSATE SODIUM 8.6-50 MG PO TABS
1.0000 | ORAL_TABLET | Freq: Every day | ORAL | Status: DC
  Administered 2014-11-02 – 2014-11-04 (×3): 1 via ORAL
  Filled 2014-11-01 (×6): qty 1

## 2014-11-01 MED ORDER — HYDROCODONE-ACETAMINOPHEN 5-325 MG PO TABS
1.0000 | ORAL_TABLET | Freq: Three times a day (TID) | ORAL | Status: DC | PRN
  Administered 2014-11-02: 1 via ORAL
  Filled 2014-11-01: qty 1

## 2014-11-01 MED ORDER — PANTOPRAZOLE SODIUM 40 MG PO TBEC
40.0000 mg | DELAYED_RELEASE_TABLET | Freq: Two times a day (BID) | ORAL | Status: DC
  Administered 2014-11-01 – 2014-11-06 (×9): 40 mg via ORAL
  Filled 2014-11-01 (×12): qty 1

## 2014-11-01 MED ORDER — HALOPERIDOL LACTATE 5 MG/ML IJ SOLN
1.0000 mg | Freq: Once | INTRAMUSCULAR | Status: AC
  Administered 2014-11-01: 1 mg via INTRAVENOUS
  Filled 2014-11-01: qty 1

## 2014-11-01 MED ORDER — FERROUS SULFATE 325 (65 FE) MG PO TABS
325.0000 mg | ORAL_TABLET | Freq: Every day | ORAL | Status: DC
  Administered 2014-11-02 – 2014-11-04 (×3): 325 mg via ORAL
  Filled 2014-11-01 (×6): qty 1

## 2014-11-01 MED ORDER — RA SALINE ENEMA 19-7 GM/118ML RE ENEM: ENEMA | Freq: Every day | RECTAL | Status: DC | PRN

## 2014-11-01 MED ORDER — KETOROLAC TROMETHAMINE 30 MG/ML IJ SOLN
30.0000 mg | Freq: Once | INTRAMUSCULAR | Status: AC
  Administered 2014-11-01: 30 mg via INTRAVENOUS
  Filled 2014-11-01: qty 1

## 2014-11-01 MED ORDER — CETYLPYRIDINIUM CHLORIDE 0.05 % MT LIQD
7.0000 mL | Freq: Two times a day (BID) | OROMUCOSAL | Status: DC
  Administered 2014-11-01 – 2014-11-08 (×13): 7 mL via OROMUCOSAL

## 2014-11-01 MED ORDER — SODIUM CHLORIDE 0.9 % IV BOLUS (SEPSIS)
1000.0000 mL | Freq: Once | INTRAVENOUS | Status: AC
  Administered 2014-11-01: 1000 mL via INTRAVENOUS

## 2014-11-01 MED ORDER — ALBUTEROL SULFATE (5 MG/ML) 0.5% IN NEBU: 2.5000 mg | INHALATION_SOLUTION | RESPIRATORY_TRACT | Status: DC | PRN

## 2014-11-01 MED ORDER — DEXTROSE 5 % IV SOLN
1.0000 g | Freq: Three times a day (TID) | INTRAVENOUS | Status: DC
  Administered 2014-11-01 – 2014-11-07 (×18): 1 g via INTRAVENOUS
  Filled 2014-11-01 (×21): qty 1

## 2014-11-01 MED ORDER — ONDANSETRON HCL 4 MG/2ML IJ SOLN
4.0000 mg | Freq: Three times a day (TID) | INTRAMUSCULAR | Status: DC | PRN
  Administered 2014-11-06: 4 mg via INTRAVENOUS
  Filled 2014-11-01: qty 2

## 2014-11-01 MED ORDER — PRAMOXINE HCL 1 % RE FOAM
1.0000 "application " | Freq: Two times a day (BID) | RECTAL | Status: DC
  Filled 2014-11-01: qty 15

## 2014-11-01 MED ORDER — ACETAMINOPHEN 325 MG PO TABS
650.0000 mg | ORAL_TABLET | Freq: Four times a day (QID) | ORAL | Status: DC | PRN
  Administered 2014-11-04 – 2014-11-08 (×6): 650 mg via ORAL
  Filled 2014-11-01 (×7): qty 2

## 2014-11-01 MED ORDER — MAGNESIUM HYDROXIDE 400 MG/5ML PO SUSP: 30.0000 mL | Freq: Every day | ORAL | Status: DC | PRN

## 2014-11-01 MED ORDER — LUBIPROSTONE 24 MCG PO CAPS
24.0000 ug | ORAL_CAPSULE | Freq: Two times a day (BID) | ORAL | Status: DC
  Administered 2014-11-02 – 2014-11-08 (×8): 24 ug via ORAL
  Filled 2014-11-01 (×12): qty 1

## 2014-11-01 MED ORDER — DEXTROSE 5 % IV SOLN
1.0000 g | Freq: Once | INTRAVENOUS | Status: AC
  Administered 2014-11-01: 1 g via INTRAVENOUS
  Filled 2014-11-01: qty 1

## 2014-11-01 MED ORDER — VANCOMYCIN HCL IN DEXTROSE 750-5 MG/150ML-% IV SOLN
750.0000 mg | Freq: Two times a day (BID) | INTRAVENOUS | Status: DC
  Administered 2014-11-01 – 2014-11-03 (×4): 750 mg via INTRAVENOUS
  Filled 2014-11-01 (×7): qty 150

## 2014-11-01 MED ORDER — VANCOMYCIN HCL IN DEXTROSE 1-5 GM/200ML-% IV SOLN
1000.0000 mg | Freq: Once | INTRAVENOUS | Status: AC
  Administered 2014-11-01: 1000 mg via INTRAVENOUS
  Filled 2014-11-01: qty 200

## 2014-11-01 MED ORDER — HYDROCORTISONE NA SUCCINATE PF 100 MG IJ SOLR
50.0000 mg | Freq: Once | INTRAMUSCULAR | Status: AC
  Administered 2014-11-01: 50 mg via INTRAVENOUS
  Filled 2014-11-01: qty 2

## 2014-11-01 MED ORDER — PREDNISONE 10 MG PO TABS
10.0000 mg | ORAL_TABLET | Freq: Every day | ORAL | Status: DC
  Administered 2014-11-02 – 2014-11-08 (×4): 10 mg via ORAL
  Filled 2014-11-01 (×7): qty 1

## 2014-11-01 NOTE — Plan of Care (Signed)
Problem: Phase I Progression Outcomes Goal: Initial discharge plan identified Outcome: Completed/Met Date Met:  11/01/14 To return to [facility]

## 2014-11-01 NOTE — Progress Notes (Signed)
ANTIBIOTIC CONSULT NOTE - INITIAL  Pharmacy Consult for Vancomycin and Aztreonam Indication: HCAP  Allergies  Allergen Reactions  . Ativan [Lorazepam] Itching, Swelling and Rash    Throat, Tongue swelling  . Lexapro [Escitalopram Oxalate] Swelling  . Latex Hives and Rash  . Levaquin [Levofloxacin] Other (See Comments)    Per MAR  . Penicillins Other (See Comments)    Per MAR  . Tetanus-Diphtheria Toxoids Td Other (See Comments)    Per MAR    Patient Measurements:    Vital Signs: Temp: 98.1 F (36.7 C) (09/23 0257) Temp Source: Oral (09/23 0257) BP: 118/89 mmHg (09/23 0615) Pulse Rate: 73 (09/23 0615) Intake/Output from previous day:   Intake/Output from this shift:    Labs:  Recent Labs  11/01/14 0323 11/01/14 0327  WBC 9.9  --   HGB 9.7* 10.9*  PLT 316  --   CREATININE  --  1.10*   Estimated Creatinine Clearance: 46.3 mL/min (by C-G formula based on Cr of 1.1). No results for input(s): VANCOTROUGH, VANCOPEAK, VANCORANDOM, GENTTROUGH, GENTPEAK, GENTRANDOM, TOBRATROUGH, TOBRAPEAK, TOBRARND, AMIKACINPEAK, AMIKACINTROU, AMIKACIN in the last 72 hours.   Microbiology: No results found for this or any previous visit (from the past 720 hour(s)).  Medical History: Past Medical History  Diagnosis Date  . HTN (hypertension)   . Diverticulosis   . Hemorrhoid   . Osteoarthritis   . Constipation, chronic   . H/O: hysterectomy   . Systolic and diastolic CHF, chronic     a. Echo 07/2011: Technically limited; inferior HK, inferolateral and possibly lateral wall HK, EF 40%, moderate LVE, aortic sclerosis without stenosis, mild MR, mild LAE, question RV dysfunction;   b.  Echo (04/2013): EF 50-55%, normal wall motion, moderate MR, mild to moderate LAE, PASP 37  . Female bladder prolapse     with prolapse uterus, cystocele, s/p TVH/BSO 12/2006  . Obesity (BMI 30-39.9)     wt. 94.1 kg 12/2010  . Moderate mitral regurgitation     a. Mod to Sev by echo 01/2009;  b. mild MR  by echo 2013  . Atrial fibrillation 10/13/2011    coumadin Rx  . Iron deficiency anemia   . Pneumonia   . Shortness of breath dyspnea   . GERD (gastroesophageal reflux disease)   . DM type 2 (diabetes mellitus, type 2) 07/25/2014  . Hyperlipidemia   . Ankle fracture   . Fracture, ankle Dec. 26, 2015    Left  . Fall at home March 2016  . Dysrhythmia   . History of hiatal hernia     Medications:  See electronic med rec  Assessment: 80 y.o. female presents from [place] with flank pain. Allergies to both PCN and Levaquin. Received Vancomycin 1gm in ED ~0530. To continue Vancomycin and Aztreonam upon admission for UTI and HCAP. Cultures drawn. WBC wnl. Afeb. SCr 1.1, est CrCl 45 ml/min.   Goal of Therapy:  Vancomycin trough level 15-20 mcg/ml  Plan:  Aztreonam 1gm IV q8h Vancomycin 750mg  IV q12h Will f/u renal function, micro data, and pt's clinical condition Vanc trough prn  Sherlon Handing, PharmD, BCPS Clinical pharmacist, pager [phone] 11/01/2014,6:34 AM

## 2014-11-01 NOTE — Progress Notes (Addendum)
[facility] PROGRESS NOTE  [name] TDS:[id number] DOB: May 06, 1934 DOA: 11/01/2014 PCP: Osborne Casco, MD  Assessment/Plan: Acute pyelonephritis Continue empiric aztreonam. Left nonobstructive nephrolithiasis on CT scan from 9/20. No hydronephrosis noted. Follow urine culture and blood culture.  history of recurrent UTI.  Acute encephalopathy Secondary to pyelonephritis and possible pneumonia. Avoid narcotics or benzos. Continue neuro checks.  Possible healthcare associated pneumonia On empiric vancomycin and aztreonam. Follow blood culture.  Hyponatremia Possibly secondary to dehydration. Hold metolazone. Monitor in am  Iron deficiency anemia with drop in H&H Hemoglobin dropped to 9 from baseline of 12.5. Has history of GI bleed. Check stool for occult blood. Monitor H&H closely. Followed by [facility] as outpatient.  Peripheral vascular disease Status post PTA L SFA and peroneal artery by Dr. Bridgett Larsson 09/25/14. Continue Plavix and statin.  Diastolic CHF Appears euvolemic. Discontinue metolazone for now given hyponatremia. Received IV fluids in the ED.  A. fib Not on antibiotic radiation due to GI bleed. Continue metoprolol.  Chronic respiratory failure Continue O2 via nasal cannula (on 2 L O2 at home)  Morbid obesity  Chronic kidney disease stage II Renal function at baseline   Constipation with high stool burden Continue lactulose and MiraLAX Ordered soapsuds enema.   DVT prophylaxis: Subcutaneous heparin  Diet: Nothing by mouth until mentation improves     Code Status: Full Code Family Communication: Daughter at bedside Disposition Plan: Home Once improved   Consultants:  None  Procedures:  CT abdomen (renal stone study)  Antibiotics:  IV vancomycin and aztreonam  HPI/Subjective: Admission H&P reviewed. Patient is somnolent and very hard of hearing  Objective: Filed Vitals:   11/01/14 1309  BP: 122/79  Pulse: 72  Temp: 97.7  F (36.5 C)  Resp: 20   No intake or output data in the 24 hours ending 11/01/14 1329 Filed Weights   11/01/14 0911  Weight: 106.2 kg (234 lb 2.1 oz)    Exam:   General:  Elderly female lying in bed sleepy, extremely hard of hearing  HEENT: No pallor, moist oral mucosa, supple neck  Chest: Diminished breath sounds bilaterally due to body habitus  CVS: Normal S1 and S2, no murmurs rub or gallop neck signs: Soft, nondistended, nontender, bowel sounds present, (severe tenderness not examined)  Musculoskeletal: Warm, no edema, ulcer left tibia (chronic)  CNS: Somnolent, very hard of hearing, chronic left-sided weakness  Data Reviewed: Basic Metabolic Panel:  Recent Labs Lab 11/01/14 0327  NA 126*  K 4.1  CL 92*  GLUCOSE 85  BUN 26*  CREATININE 1.10*   Liver Function Tests:  Recent Labs Lab 11/01/14 0323  AST 17  ALT 19  ALKPHOS 123  BILITOT 0.5  PROT 5.5*  ALBUMIN 2.8*    Recent Labs Lab 11/01/14 0323  LIPASE 16*    Recent Labs Lab 11/01/14 0532  AMMONIA 26   CBC:  Recent Labs Lab 11/01/14 0323 11/01/14 0327 11/01/14 0656  WBC 9.9  --  9.2  NEUTROABS 8.0*  --   --   HGB 9.7* 10.9* 9.1*  HCT 30.8* 32.0* 28.6*  MCV 102.3*  --  103.2*  PLT 316  --  283   Cardiac Enzymes:  Recent Labs Lab 11/01/14 0656  TROPONINI <0.03   BNP (last 3 results)  Recent Labs  09/20/14 0838  BNP 332.2*    ProBNP (last 3 results)  Recent Labs  11/28/13 1110  PROBNP 2108.0*    CBG:  Recent Labs Lab 11/01/14 0955  GLUCAP 77  Recent Results (from the past 240 hour(s))  MRSA PCR Screening     Status: None   Collection Time: 11/01/14  9:36 AM  Result Value Ref Range Status   MRSA by PCR NEGATIVE NEGATIVE Final    Comment:        The GeneXpert MRSA Assay (FDA approved for NASAL specimens only), is one component of a comprehensive MRSA colonization surveillance program. It is not intended to diagnose MRSA infection nor to guide  or monitor treatment for MRSA infections.      Studies: Dg Abd Acute W/chest  11/01/2014   CLINICAL DATA:  Constant ongoing flank pain. Patient was evaluated on 09/18 for same and diagnosed with a kidney stone. Patient is attempting to pass the stone. Constipation.  EXAM: DG ABDOMEN ACUTE W/ 1V CHEST  COMPARISON:  Chest radiograph 07/30/2014. CT abdomen and pelvis 10/29/2014.  FINDINGS: Shallow inspiration. Cardiac enlargement. Prominent central pulmonary vascularity with suggestion of perihilar infiltration, likely edema. Focal area of more prominent infiltration in the left mid lung could represent superimposed pneumonia or atelectasis. No blunting of costophrenic angles. No pneumothorax.  Prominent stool-filled rectum. Scattered gas and stool in the colon. No small or large bowel distention. No free intra-abdominal air. No abnormal air-fluid levels. No radiopaque stones. Vascular calcifications. Calcified phleboliths in the pelvis. Degenerative changes in the spine and hips.  IMPRESSION: Cardiac enlargement with probable perihilar edema and focal infiltration or atelectasis in the left mid lung.  Nonobstructive bowel gas pattern with prominent stool-filled rectum. No calcified radiopaque stones identified.   Electronically Signed   By: Lucienne Capers M.D.   On: 11/01/2014 04:43    Scheduled Meds: . antiseptic oral rinse  7 mL Mouth Rinse q12n4p  . atorvastatin  40 mg Oral q1800  . aztreonam  1 g Intravenous 3 times per day  . carvedilol  3.125 mg Oral BID WC  . chlorhexidine  15 mL Mouth Rinse BID  . feeding supplement (PRO-STAT SUGAR FREE 64)  30 mL Oral BID  . ferrous sulfate  325 mg Oral Daily  . HYDROcodone-acetaminophen  1 tablet Oral 3 times per day  . hydrocortisone-pramoxine  1 applicator Rectal BID  . lactulose  10 g Oral BID  . lubiprostone  24 mcg Oral BID WC  . pantoprazole  40 mg Oral BID  . predniSONE  10 mg Oral Q breakfast  . senna-docusate  1 tablet Oral Daily  .  vancomycin  750 mg Intravenous Q12H   Continuous Infusions:     Time spent: 20 minutes    DHUNGEL, NISHANT  [facility] Pager [phone] If 7PM-7AM, please contact night-coverage at [email or web address], password [facility] 11/01/2014, 1:29 PM  LOS: 0 days

## 2014-11-01 NOTE — Consult Note (Addendum)
WOC wound consult note Reason for Consult: Consult requested for left leg wound; this is chronic and is followed by Dr Bridgett Larsson of the [facility] service prior to admission according to EMR. Daughter states tendons and bone were previously visible and wound has decreased in size. Daughter at bedside to assess wounds and discuss plan of care. Wound type:Left calf with full thickness wound; 2X.3X.8cm, red wound bed, no odor, mod amt yellow drainage.   Left and right hips with patchy areas of yellow patchy partial thickness wounds; appearance consistent with shear and moisture associated skin damage. These are NOT pressure ulcers, they are surrounded by pink dry scar tissue from other previous areas which have healed. Dressing procedure/placement/frequency: Foam dressings to bilat hips to protect and promote healing. Continue present plan of care to left leg with Aquacel and foam dressing every other day.  Pt can resume follow-up with [facility]VS team after discharge. Please re-consult if further assistance is needed.  Thank-you,  Julien Girt MSN, [place], [place], [place], [place]

## 2014-11-01 NOTE — Progress Notes (Signed)
Soap Suds enema completed, patient tolerated well, approx 700cc infused without difficulty. Small amount of soft yellowish/green stool noted. No acute distress noted.

## 2014-11-01 NOTE — ED Notes (Signed)
Per ems-- pt is supposed to be passing a kidney stone. Pt unable to tell ems exactly where she is hurting. Pt has foley in place. Pt has had decreased oral intake and no bm since Friday.

## 2014-11-01 NOTE — Progress Notes (Signed)
PT Cancellation Note  Patient Details Name: [name] MRN: [id number] DOB: 04-06-1934   Cancelled Treatment:    Reason Eval/Treat Not Completed: Patient's level of consciousness;Patient not medically ready.  Pt not aroused enough to participate this pm.  Will try back 9/24 as able. 11/01/2014  [name], [place] [phone]  (pager)   [place], [name] 11/01/2014, 5:19 PM

## 2014-11-01 NOTE — Progress Notes (Signed)
[name] [id number] Admitted to 5W 16: 11/01/2014 7:44 PM Attending Provider: Louellen Molder, MD    [name] is a 80 y.o. female patient admitted from ED lethargic, responds to voice only,  Full Code, VSS - Blood pressure 122/79, pulse 72, temperature 97.7 F (36.5 C), temperature source Oral, resp. rate 20, height 5\' 2"  (1.575 m), weight 106.2 kg (234 lb 2.1 oz), SpO2 100 %., O2    2 L nasal cannular, no c/o shortness of breath, no c/o chest pain, no distress noted. Tele # [id number] placed and pt is currently running:atrial fibrillation, with ventricular rate of 100.   IV site WDL:  hand left, condition patent and no redness with a transparent dsg that's clean dry and intact.  Allergies:   Allergies  Allergen Reactions  . Ativan [Lorazepam] Itching, Swelling and Rash    Throat, Tongue swelling  . Lexapro [Escitalopram Oxalate] Swelling  . Latex Hives and Rash  . Levaquin [Levofloxacin] Other (See Comments)    Per MAR  . Penicillins Other (See Comments)    Per MAR  . Tetanus-Diphtheria Toxoids Td Other (See Comments)    Per [facility]     Past Medical History  Diagnosis Date  . HTN (hypertension)   . Diverticulosis   . Hemorrhoid   . Osteoarthritis   . Constipation, chronic   . H/O: hysterectomy   . Systolic and diastolic CHF, chronic     a. Echo 07/2011: Technically limited; inferior HK, inferolateral and possibly lateral wall HK, EF 40%, moderate LVE, aortic sclerosis without stenosis, mild MR, mild LAE, question RV dysfunction;   b.  Echo (04/2013): EF 50-55%, normal wall motion, moderate MR, mild to moderate LAE, PASP 37  . Female bladder prolapse     with prolapse uterus, cystocele, s/p TVH/BSO 12/2006  . Obesity (BMI 30-39.9)     wt. 94.1 kg 12/2010  . Moderate mitral regurgitation     a. Mod to Sev by echo 01/2009;  b. mild MR by echo 2013  . Atrial fibrillation 10/13/2011    coumadin Rx  . Iron deficiency anemia   . Pneumonia   . Shortness of breath dyspnea   .  GERD (gastroesophageal reflux disease)   . DM type 2 (diabetes mellitus, type 2) 07/25/2014  . Hyperlipidemia   . Ankle fracture   . Fracture, ankle Dec. 26, 2015    Left  . Fall at home March 2016  . Dysrhythmia   . History of hiatal hernia     History:  obtained from unobtainable from patient due to mental status.    Admission INP armband ID verified with patient/family, and in place. SR up x 2, bed alarm placed, fall risk assessment complete.   Skin, moist with wound to left lower leg with foam dressing, MASD to sacrum with 2 open areas noted on right side, placed foam dressing,  ITD to left abdomen folds and ecchymosis to bilat. Arms noted on exam.    Will cont to monitor and assist as needed.  Lindalou Hose, RN 11/01/2014 7:44 PM

## 2014-11-01 NOTE — H&P (Signed)
[facility] History and Physical  [name] VQQ:[id number] DOB: 03-26-1934 DOA: 11/01/2014  Referring physician: ED physician PCP: Osborne Casco, MD  Specialists:   Chief Complaint: Left flank pain, dysuria, increased urinary frequency, altered mental status,  HPI: [name] is a 80 y.o. female with PMH of PVD (s/p of PTA L SFA and peroneal artery by Dr. Bridgett Larsson 09/25/14), hypertension, diet-controlled diabetes mellitus, GERD, diastolic congestive heart failure, bladder prolapse, mitral valve regurgitation, atrial fibrillation (not on anticoagulant due to GI bleeding), iron deficiency anemia, GI bleeding, chronic respiratory failure on 2 L oxygen at home, CKD-II, recurrent UTI, morbid obesity, who presents with right flank pain, dysuria, increased urinary frequency and altered mental status.  Patient has AMS and is unable to provide accurate medical history, therefore, most of the history is obtained by discussing the case with ED physician, per her daughter. It seems that patient has been having left flank pain in the past 5 days. Patient has increased urinary frequency and and dysuria per her daughter. Patient is confused, cannot see her daughter's name right. Patient is constipated, but no abdominal pain, diarrhea, fever or chills. Patient has mild chest pain, no cough. She has chronic shortness of breath and is on 2 L oxygen SNF, which has not changed. Patient had surgery to left leg due to PVD on 09/25/14. She has a wound over left lower leg which is in healing process. She does not move her left leg much, which is normal per her daughter. Per daughter, patient does not have hematochezia or hematuria. No dark or tarry stools recently.  In ED, patient was found to have positive urinalysis, negative troponin, hemoglobin dropped from 12.9 on 09/25/14-->9.7. Sodium 126, stable renal function, temperature normal, no tachycardia. X-ray of acute abdomen/chest showed possible focal  infiltration in the left mid lung.  Where does patient live?   SNF Can patient participate in ADLs?   None  Review of Systems: Could not be reviewed due to altered mental status.  travel history: No recent long distant travel.  Allergy:  Allergies  Allergen Reactions  . Ativan [Lorazepam] Itching, Swelling and Rash    Throat, Tongue swelling  . Lexapro [Escitalopram Oxalate] Swelling  . Latex Hives and Rash  . Levaquin [Levofloxacin] Other (See Comments)    Per MAR  . Penicillins Other (See Comments)    Per MAR  . Tetanus-Diphtheria Toxoids Td Other (See Comments)    Per [facility]    Past Medical History  Diagnosis Date  . HTN (hypertension)   . Diverticulosis   . Hemorrhoid   . Osteoarthritis   . Constipation, chronic   . H/O: hysterectomy   . Systolic and diastolic CHF, chronic     a. Echo 07/2011: Technically limited; inferior HK, inferolateral and possibly lateral wall HK, EF 40%, moderate LVE, aortic sclerosis without stenosis, mild MR, mild LAE, question RV dysfunction;   b.  Echo (04/2013): EF 50-55%, normal wall motion, moderate MR, mild to moderate LAE, PASP 37  . Female bladder prolapse     with prolapse uterus, cystocele, s/p TVH/BSO 12/2006  . Obesity (BMI 30-39.9)     wt. 94.1 kg 12/2010  . Moderate mitral regurgitation     a. Mod to Sev by echo 01/2009;  b. mild MR by echo 2013  . Atrial fibrillation 10/13/2011    coumadin Rx  . Iron deficiency anemia   . Pneumonia   . Shortness of breath dyspnea   . GERD (gastroesophageal reflux disease)   .  DM type 2 (diabetes mellitus, type 2) 07/25/2014  . Hyperlipidemia   . Ankle fracture   . Fracture, ankle Dec. 26, 2015    Left  . Fall at home March 2016  . Dysrhythmia   . History of hiatal hernia     Past Surgical History  Procedure Laterality Date  . Wrist surgery    . Vaginal hysterectomy,cystocele and prolapse  repair  NOV. 2008  . Abdominal hysterectomy    . Colonoscopy N/A 07/18/2013    Procedure:  COLONOSCOPY;  Surgeon: Wonda Horner, MD;  Location: [facility] ENDOSCOPY;  Service: Endoscopy;  Laterality: N/A;  . Flexible sigmoidoscopy N/A 03/08/2014    Procedure: FLEXIBLE SIGMOIDOSCOPY;  Surgeon: Lear Ng, MD;  Location: [facility] ENDOSCOPY;  Service: Endoscopy;  Laterality: N/A;  need hoyer lift  . Aortogram Left 09/25/2014    Procedure: AORTOGRAM WITH LEFT LEG LOWER EXTREMIY RUNOFF; ANGIOPLASTY OF TIBIAL AND PERONEAL ARTERY;  Surgeon: Conrad Tallula, MD;  Location: [place];  Service: Vascular;  Laterality: Left;    Social History:  reports that she has never smoked. She has quit using smokeless tobacco. Her smokeless tobacco use included Snuff. She reports that she does not drink alcohol or use illicit drugs.  Family History:  Family History  Problem Relation Age of Onset  . Diabetes Brother   . Hypertension Mother     deceased @ 78  . Leukemia Brother   . Cancer Brother   . Stroke Mother   . Diabetes Father     deceased in his 70's     Prior to Admission medications   Medication Sig Start Date End Date Taking? Authorizing Provider  albuterol (PROVENTIL) (2.5 MG/3ML) 0.083% nebulizer solution Take 2.5 mg by nebulization every 4 (four) hours as needed for wheezing or shortness of breath (For wheezing and shortness of breath).    Yes Historical Provider, MD  Amino Acids-Protein Hydrolys (FEEDING SUPPLEMENT, PRO-STAT SUGAR FREE 64,) LIQD Take 30 mLs by mouth 2 (two) times daily.   Yes Historical Provider, MD  atorvastatin (LIPITOR) 40 MG tablet Take 40 mg by mouth daily.   Yes Historical Provider, MD  bisacodyl (DULCOLAX) 10 MG suppository Place 10 mg rectally daily as needed for moderate constipation (if constipation not relieved by MOM).   Yes Historical Provider, MD  carvedilol (COREG) 3.125 MG tablet Take 3.125 mg by mouth 2 (two) times daily with a meal. Hold if SBP <100   Yes Historical Provider, MD  ferrous sulfate 325 (65 FE) MG tablet Take 325 mg by mouth daily.   Yes Historical  Provider, MD  HYDROcodone-acetaminophen (NORCO/VICODIN) 5-325 MG per tablet Take 1 tablet by mouth 3 (three) times daily as needed (pain). 09/20/14  Yes Shanker Kristeen Mans, MD  HYDROcodone-acetaminophen (NORCO/VICODIN) 5-325 MG per tablet Take 1 tablet by mouth every 8 (eight) hours.   Yes Historical Provider, MD  lactulose (CHRONULAC) 10 GM/15ML solution Take 10 g by mouth 2 (two) times daily.    Yes Historical Provider, MD  levalbuterol Penne Lash) 0.63 MG/3ML nebulizer solution Take 0.63 mg by nebulization every 4 (four) hours as needed for wheezing or shortness of breath.   Yes Historical Provider, MD  lubiprostone (AMITIZA) 24 MCG capsule Take 1 capsule (24 mcg total) by mouth 2 (two) times daily with a meal. Patient taking differently: Take 24 mcg by mouth 2 (two) times daily with a meal. For constipation 07/19/13  Yes Velvet Bathe, MD  magnesium hydroxide (MILK OF MAGNESIA) 400 MG/5ML suspension Take 30  mLs by mouth daily as needed for mild constipation.   Yes Historical Provider, MD  metolazone (ZAROXOLYN) 2.5 MG tablet Take 2.5 mg by mouth once a week. Weekly on Wed   Yes Historical Provider, MD  ondansetron (ZOFRAN) 8 MG tablet Take 8 mg by mouth every 8 (eight) hours as needed for nausea or vomiting.   Yes Historical Provider, MD  pantoprazole (PROTONIX) 40 MG tablet Take 1 tablet (40 mg total) by mouth 2 (two) times daily. 08/10/13  Yes Barton Dubois, MD  polyethylene glycol [employer] / GLYCOLAX) packet Take 17 g by mouth daily.   Yes Historical Provider, MD  pramoxine (PROCTOFOAM) 1 % foam Place 1 application rectally 2 (two) times daily.   Yes Historical Provider, MD  predniSONE (DELTASONE) 10 MG tablet Take 10 mg by mouth daily with breakfast.    Yes Historical Provider, MD  sennosides-docusate sodium (SENOKOT-S) 8.6-50 MG tablet Take 1 tablet by mouth daily.   Yes Historical Provider, MD  simethicone (MYLICON) 80 MG chewable tablet Chew 80 mg by mouth as needed for flatulence.   Yes Historical  Provider, MD  Sodium Phosphates (RA SALINE ENEMA RE) Place 1 each rectally daily as needed (constipation).   Yes Historical Provider, MD  traMADol (ULTRAM) 50 MG tablet Take 1 tablet (50 mg total) by mouth every 8 (eight) hours. 09/20/14  Yes Shanker Kristeen Mans, MD  UNABLE TO FIND Take by mouth 2 (two) times daily. Med Name: Magic Cup   Yes Historical Provider, MD  UNABLE TO FIND Take 120 mLs by mouth 2 (two) times daily. Med Name: Med Pass   Yes Historical Provider, MD  clopidogrel (PLAVIX) 75 MG tablet Take 1 tablet (75 mg total) by mouth daily. Patient not taking: Reported on 11/01/2014 09/25/14   Conrad Peninsula, MD  potassium chloride SA (K-DUR,KLOR-CON) 20 MEQ tablet Take 1 tablet (20 mEq total) by mouth daily. Patient not taking: Reported on 11/01/2014 09/20/14   Jonetta Osgood, MD    Physical Exam: Filed Vitals:   11/01/14 0600 11/01/14 0615 11/01/14 0630 11/01/14 0700  BP: 124/69 118/89 112/64   Pulse: 74 73 72   Temp:    98.2 F (36.8 C)  TempSrc:    Oral  Resp: 14 12 12    SpO2: 99% 99% 100%    General: Not in acute distress HEENT:       Eyes: PERRL, EOMI, no scleral icterus.       ENT: No discharge from the ears and nose, no pharynx injection, no tonsillar enlargement.        Neck: No JVD, no bruit, no mass felt. Heme: No neck lymph node enlargement. Cardiac: S1/S2, irregularly irregular rhythm, No murmurs, No gallops or rubs. Pulm: No rales, wheezing, rhonchi or rubs. Abd: Soft, nondistended, nontender, no rebound pain, no organomegaly, BS present. Ext: 1+  pitting leg edema bilaterally. 2+DP/PT pulse bilaterally. There is small wound over left lower leg without discharge. Musculoskeletal: No joint deformities, No joint redness or warmth, no limitation of ROM in spin. Skin: No rashes.  Neuro: Alert, not oriented X3, cranial nerves II-XII grossly intact, moves all extremities except for left leg which is normal per her daughter. Psych: Patient is not psychotic, no suicidal or  hemocidal ideation.  Labs on Admission:  Basic Metabolic Panel:  Recent Labs Lab 11/01/14 0327  NA 126*  K 4.1  CL 92*  GLUCOSE 85  BUN 26*  CREATININE 1.10*   Liver Function Tests:  Recent Labs Lab 11/01/14  0323  AST 17  ALT 19  ALKPHOS 123  BILITOT 0.5  PROT 5.5*  ALBUMIN 2.8*    Recent Labs Lab 11/01/14 0323  LIPASE 16*    Recent Labs Lab 11/01/14 0532  AMMONIA 26   CBC:  Recent Labs Lab 11/01/14 0323 11/01/14 0327  WBC 9.9  --   NEUTROABS 8.0*  --   HGB 9.7* 10.9*  HCT 30.8* 32.0*  MCV 102.3*  --   PLT 316  --    Cardiac Enzymes: No results for input(s): CKTOTAL, CKMB, CKMBINDEX, TROPONINI in the last 168 hours.  BNP (last 3 results)  Recent Labs  09/20/14 0838  BNP 332.2*    ProBNP (last 3 results)  Recent Labs  11/28/13 1110  PROBNP 2108.0*    CBG: No results for input(s): GLUCAP in the last 168 hours.  Radiological Exams on Admission: Dg Abd Acute W/chest  11/01/2014   CLINICAL DATA:  Constant ongoing flank pain. Patient was evaluated on 09/18 for same and diagnosed with a kidney stone. Patient is attempting to pass the stone. Constipation.  EXAM: DG ABDOMEN ACUTE W/ 1V CHEST  COMPARISON:  Chest radiograph 07/30/2014. CT abdomen and pelvis 10/29/2014.  FINDINGS: Shallow inspiration. Cardiac enlargement. Prominent central pulmonary vascularity with suggestion of perihilar infiltration, likely edema. Focal area of more prominent infiltration in the left mid lung could represent superimposed pneumonia or atelectasis. No blunting of costophrenic angles. No pneumothorax.  Prominent stool-filled rectum. Scattered gas and stool in the colon. No small or large bowel distention. No free intra-abdominal air. No abnormal air-fluid levels. No radiopaque stones. Vascular calcifications. Calcified phleboliths in the pelvis. Degenerative changes in the spine and hips.  IMPRESSION: Cardiac enlargement with probable perihilar edema and focal  infiltration or atelectasis in the left mid lung.  Nonobstructive bowel gas pattern with prominent stool-filled rectum. No calcified radiopaque stones identified.   Electronically Signed   By: Lucienne Capers M.D.   On: 11/01/2014 04:43    EKG: Independently reviewed.  Abnormal findings:  Atrial fibrillation, old left bundle blockage, LAD   Assessment/Plan Principal Problem:   Pyelonephritis Active Problems:   CONSTIPATION, CHRONIC   Mitral regurgitation   Female bladder prolapse   Hyponatremia   Iron deficiency anemia   Atrial fibrillation   Diastolic CHF   Acute encephalopathy   Chronic respiratory failure with hypoxia   History of lower GI bleeding   GERD (gastroesophageal reflux disease)   CKD (chronic kidney disease), stage III   DM type 2 (diabetes mellitus, type 2)   Hyperlipidemia   Atherosclerotic PVD with ulceration   Flank pain   UTI (lower urinary tract infection)  Pyelonephritis: CT abdomen/pelvis on 10/04/14 showed small left kidney stone, 5 mm. Now patient presents with left flank pain with positive urinalysis, consistent with pyelonephritis. Patient does not meet criteria for sepsis. Hemodynamically stable.  - admit to tele bed - IV vancomycin and aztreonam - Follow up results of urine and blood cx and amend antibiotic regimen if needed per sensitivity results - prn Zofran for nausea - will get Procalcitonin and trend lactic acid levels - IVF: received 1L of NS bolus in ED, will not continue IVF (patient has a congestive heart failure, limiting aggressive IV fluids treatment).  Chest pain and possible pneumonia: Patient has chest pain. No fever, chills, cough. Chest x-ray showed possible focal infiltration. -on IV abx as above -sputum culture -trop x 3 to r/o ACS  Hyponatremia: likely due to diuretics, metolazone -Received 1 L of  normal saline bolus -Hold metolazone now   Iron deficiency anemia and hx of GIB: Hgb dropped from 12.9-1.7. Per daughter,  patient was not noted to have rectal bleeding. -Check FOBT - Avoid NSAIDs and SQ heparin - Maintain IV access (2 large bore IVs if possible). - Monitor closely and follow q6h cbc, transfuse as necessary. - LaB: INR, PTT and type and screen  Atrial Fibrillation: CHA2DS2-VASc Score is 5, needs oral anticoagulation. Patient is not on AC due to hx of GIB. Heart rate is well controlled. -Continue Coreg  Chronic diastolic congestive heart failure: 2-D echo on 04/27/13 showed EF 50-55%. Patient is on metolazone at home. She has at least 1+ leg edema, indicating fluid overload. Due to ongoing pyelonephritis, and risk of developing sepsis, we'll hold metolazone temporarily. -Hold metolazone, but will restart as soon as pyelonephritis infection is controlled. -Check BNP -Patient was given 1 L normal saline bolus by ED. No more IVF unless lactate level comes back elevated.  Acute encephalopathy: Most likely secondary to pyelonephritis. -IV antibiotics as above -Frequent neuro check   GERD: -Protonix  CKD (chronic kidney disease), stage III: Baseline creatinine 1.3-1.5. Her creatinine is 1.10, which is lower than baseline. -Follow-up renal function by BMP  HLD: Last LDL was 140 on 07/20/11 -Continue home medications: Lipitor -Check FLP  Atherosclerotic PVD with ulceration: s/p of PTA L SFA and peroneal artery by Dr. Bridgett Larsson 09/25/14. -wound care consult -f/u with dr. Bridgett Larsson  Chronic respiratory failure with the hypoxia: Patient is on chronic prednisone 10 mg daily. She is on 2 L oxygen at home. -Continue home dose prednisone -Stress dose of SoluCortef, 50 mg 1  - cortisol level   DVT ppx: SCD Code Status: Full code Family Communication:  Yes, patient's  daughter at bed side Disposition Plan: Admit to inpatient   Date of Service 11/01/2014    Ivor Costa [facility] Pager [phone]  If 7PM-7AM, please contact night-coverage [email or web address] Password [facility] 11/01/2014, 7:12 AM

## 2014-11-01 NOTE — Care Management Note (Signed)
Case Management Note  Patient Details  Name: [name] MRN: [id number] Date of Birth: 11/22/1934  Subjective/Objective:                 Patient admitted from [facility] with AMS, low sodium, and UTI. Receiving IV Abx, lactulose (for constipation; ammonia level 26) NS bolus.    Action/Plan:  CM will continue to follow, DC to SNF anticipated when medically stable ,will be facilitated through SW.  Expected Discharge Date:                  Expected Discharge Plan:  [place]  In-House Referral:     Discharge planning Services  CM Consult  Post Acute Care Choice:    Choice offered to:     DME Arranged:    DME Agency:     HH Arranged:    [place] Agency:     Status of Service:  In process, will continue to follow  Medicare Important Message Given:    Date Medicare IM Given:    Medicare IM give by:    Date Additional Medicare IM Given:    Additional Medicare Important Message give by:     If discussed at [place] of Stay Meetings, dates discussed:    Additional Comments:  Carles Collet, RN 11/01/2014, 3:37 PM

## 2014-11-01 NOTE — ED Provider Notes (Addendum)
CSN: [id number]     Arrival date & time 11/01/14  0229 History   This chart was scribed for April Palumbo, MD by Forrestine Him, ED Scribe. This patient was seen in room D32C/D32C and the patient's care was started 2:50 AM.   Chief Complaint  Patient presents with  . Flank Pain   Patient is a 80 y.o. female presenting with constipation. The history is provided by a relative. The history is limited by the condition of the patient.  Constipation Severity:  Moderate Timing:  Constant Progression:  Unchanged Chronicity:  New Context: narcotics   Stool description:  Small Relieved by:  Nothing Worsened by:  Nothing tried Ineffective treatments:  None tried Associated symptoms: no diarrhea   Risk factors: recent illness      LEVEL 5 CAVEAT DUE TO PTS CONDITION   HPI Comments: [name] brought in by EMS from [name] is a 80 y.o. female with a PMHx of HTN, DM, and hyperlipidemia who presents to the Emergency Department complaining of constant, ongoing flank pain x. Pt was evaluated on 9/18 for same and was diagnosed with a kidney stone. Pt is currently still attempting to pass stone. Daughter reports decreased oral intake and no bowel movement since Friday 9/16. [name] is given prescribed Tramadol and Norco daily. Last dose given at 1 AM this morning. Daughter reports recent foley placement and a history of diverticulitis and GI bleed last year.  Past Medical History  Diagnosis Date  . HTN (hypertension)   . Diverticulosis   . Hemorrhoid   . Osteoarthritis   . Constipation, chronic   . H/O: hysterectomy   . Systolic and diastolic CHF, chronic     a. Echo 07/2011: Technically limited; inferior HK, inferolateral and possibly lateral wall HK, EF 40%, moderate LVE, aortic sclerosis without stenosis, mild MR, mild LAE, question RV dysfunction;   b.  Echo (04/2013): EF 50-55%, normal wall motion, moderate MR, mild to moderate LAE, PASP 37  . Female bladder prolapse     with prolapse  uterus, cystocele, s/p TVH/BSO 12/2006  . Obesity (BMI 30-39.9)     wt. 94.1 kg 12/2010  . Moderate mitral regurgitation     a. Mod to Sev by echo 01/2009;  b. mild MR by echo 2013  . Atrial fibrillation 10/13/2011    coumadin Rx  . Iron deficiency anemia   . Pneumonia   . Shortness of breath dyspnea   . GERD (gastroesophageal reflux disease)   . DM type 2 (diabetes mellitus, type 2) 07/25/2014  . Hyperlipidemia   . Ankle fracture   . Fracture, ankle Dec. 26, 2015    Left  . Fall at home March 2016  . Dysrhythmia   . History of hiatal hernia    Past Surgical History  Procedure Laterality Date  . Wrist surgery    . Vaginal hysterectomy,cystocele and prolapse  repair  NOV. 2008  . Abdominal hysterectomy    . Colonoscopy N/A 07/18/2013    Procedure: COLONOSCOPY;  Surgeon: Wonda Horner, MD;  Location: [facility] ENDOSCOPY;  Service: Endoscopy;  Laterality: N/A;  . Flexible sigmoidoscopy N/A 03/08/2014    Procedure: FLEXIBLE SIGMOIDOSCOPY;  Surgeon: Lear Ng, MD;  Location: [facility] ENDOSCOPY;  Service: Endoscopy;  Laterality: N/A;  need hoyer lift  . Aortogram Left 09/25/2014    Procedure: AORTOGRAM WITH LEFT LEG LOWER EXTREMIY RUNOFF; ANGIOPLASTY OF TIBIAL AND PERONEAL ARTERY;  Surgeon: Conrad Melstone, MD;  Location: [place];  Service: Vascular;  Laterality: Left;   Family History  Problem Relation Age of Onset  . Diabetes Brother   . Hypertension Mother     deceased @ 75  . Leukemia Brother   . Cancer Brother   . Stroke Mother   . Diabetes Father     deceased in his 6's   Social History  Substance Use Topics  . Smoking status: Never Smoker   . Smokeless tobacco: Former Systems developer    Types: Snuff  . Alcohol Use: No   OB History    Gravida Para Term Preterm AB TAB SAB Ectopic Multiple Living   6 6             Review of Systems  Unable to perform ROS: Other  Gastrointestinal: Positive for constipation. Negative for diarrhea.      Allergies  Ativan; Lexapro; Latex; Levaquin;  Penicillins; and Tetanus-diphtheria toxoids td  Home Medications   Prior to Admission medications   Medication Sig Start Date End Date Taking? Authorizing Provider  albuterol (PROVENTIL) (2.5 MG/3ML) 0.083% nebulizer solution Take 2.5 mg by nebulization every 4 (four) hours as needed for wheezing or shortness of breath (For wheezing and shortness of breath).     Historical Provider, MD  Amino Acids-Protein Hydrolys (FEEDING SUPPLEMENT, PRO-STAT SUGAR FREE 64,) LIQD Take 30 mLs by mouth 2 (two) times daily.    Historical Provider, MD  atorvastatin (LIPITOR) 40 MG tablet Take 40 mg by mouth daily.    Historical Provider, MD  bisacodyl (DULCOLAX) 10 MG suppository Place 10 mg rectally daily as needed for moderate constipation (if constipation not relieved by MOM).    Historical Provider, MD  carvedilol (COREG) 3.125 MG tablet Take 3.125 mg by mouth 2 (two) times daily with a meal. Hold if SBP <100    Historical Provider, MD  clopidogrel (PLAVIX) 75 MG tablet Take 1 tablet (75 mg total) by mouth daily. 09/25/14   Conrad [place], MD  ferrous sulfate 325 (65 FE) MG tablet Take 325 mg by mouth daily.    Historical Provider, MD  HYDROcodone-acetaminophen (NORCO/VICODIN) 5-325 MG per tablet Take 1 tablet by mouth 3 (three) times daily as needed (pain). 09/20/14   Shanker Kristeen Mans, MD  Insulin Glargine (TOUJEO SOLOSTAR) 300 UNIT/ML SOPN Inject 10 Units into the skin at bedtime.    Historical Provider, MD  insulin lispro (HUMALOG KWIKPEN) 100 UNIT/ML KiwkPen Inject 0-10 Units into the skin 4 (four) times daily -  before meals and at bedtime. 150 or less= 0 units 151-200= 2 units 201-250= 4 units 251-300= 6 units 301-350= 8 units 351-400= 10 units    Historical Provider, MD  lactulose (CHRONULAC) 10 GM/15ML solution Take 10 g by mouth daily.    Historical Provider, MD  loratadine (CLARITIN) 10 MG tablet Take 10 mg by mouth daily.    Historical Provider, MD  lubiprostone (AMITIZA) 24 MCG capsule Take 1  capsule (24 mcg total) by mouth 2 (two) times daily with a meal. Patient taking differently: Take 24 mcg by mouth 2 (two) times daily with a meal. For constipation 07/19/13   Velvet Bathe, MD  metolazone (ZAROXOLYN) 2.5 MG tablet Take 2.5 mg by mouth once a week. Weekly on Wed    Historical Provider, MD  pantoprazole (PROTONIX) 40 MG tablet Take 1 tablet (40 mg total) by mouth 2 (two) times daily. Patient taking differently: Take 40 mg by mouth daily. For GERD 08/10/13   Barton Dubois, MD  polyethylene glycol [facility] / GLYCOLAX) packet Take 17  g by mouth daily.    Historical Provider, MD  potassium chloride SA (K-DUR,KLOR-CON) 20 MEQ tablet Take 1 tablet (20 mEq total) by mouth daily. 09/20/14   Shanker Kristeen Mans, MD  predniSONE (DELTASONE) 10 MG tablet Take 10 mg by mouth daily with breakfast.     Historical Provider, MD  sennosides-docusate sodium (SENOKOT-S) 8.6-50 MG tablet Take 1 tablet by mouth daily.    Historical Provider, MD  Sodium Phosphates (RA SALINE ENEMA RE) Place 1 each rectally daily as needed (constipation).    Historical Provider, MD  torsemide (DEMADEX) 20 MG tablet Take 20 mg by mouth 2 (two) times daily.     Historical Provider, MD  traMADol (ULTRAM) 50 MG tablet Take 1 tablet (50 mg total) by mouth every 8 (eight) hours. 09/20/14   Shanker Kristeen Mans, MD   Triage Vitals: BP 96/51 mmHg  Pulse 75  SpO2 100%   Physical Exam  Constitutional: She appears well-developed and well-nourished. No distress.  HENT:  Head: Normocephalic and atraumatic.  Dry mucus membranes  Eyes: EOM are normal. Pupils are equal, round, and reactive to light.  Neck: Normal range of motion. Neck supple.  Cardiovascular: Normal rate, regular rhythm and normal heart sounds.   Pulmonary/Chest: Effort normal and breath sounds normal. No respiratory distress. She has no wheezes. She exhibits no tenderness.  Abdominal: Soft. She exhibits no distension. Bowel sounds are increased. There is no tenderness. There  is no rigidity, no rebound, no guarding, no tenderness at McBurney's point and negative Murphy's sign.  Musculoskeletal: Normal range of motion.  Una boots bilaterally  Dry skin noted to plantar and dorsum surface of L foot  Neurological: She is alert. She has normal reflexes.  Skin: Skin is warm and dry.  Psychiatric:  Unable because of moaning and repetitive speech  Nursing note and vitals reviewed.   ED Course  Procedures (including critical care time)  DIAGNOSTIC STUDIES: Oxygen Saturation is 100% on RA, Normal by my interpretation.    COORDINATION OF CARE: 3:03 AM-Discussed treatment plan with pt at bedside and pt agreed to plan.     Labs Review Labs Reviewed - No data to display  Imaging Review No results found. I have personally reviewed and evaluated these images and lab results as part of my medical decision-making.   EKG Interpretation None      MDM   Final diagnoses:  None    Results for orders placed or performed during the hospital encounter of 11/01/14  CBC with Differential/Platelet  Result Value Ref Range   WBC 9.9 4.0 - 10.5 K/uL   RBC 3.01 (L) 3.87 - 5.11 MIL/uL   Hemoglobin 9.7 (L) 12.0 - 15.0 g/dL   HCT 30.8 (L) 36.0 - 46.0 %   MCV 102.3 (H) 78.0 - 100.0 fL   MCH 32.2 26.0 - 34.0 pg   MCHC 31.5 30.0 - 36.0 g/dL   RDW 16.8 (H) 11.5 - 15.5 %   Platelets 316 150 - 400 K/uL   Neutrophils Relative % 81 %   Neutro Abs 8.0 (H) 1.7 - 7.7 K/uL   Lymphocytes Relative 13 %   Lymphs Abs 1.3 0.7 - 4.0 K/uL   Monocytes Relative 5 %   Monocytes Absolute 0.5 0.1 - 1.0 K/uL   Eosinophils Relative 1 %   Eosinophils Absolute 0.1 0.0 - 0.7 K/uL   Basophils Relative 0 %   Basophils Absolute 0.0 0.0 - 0.1 K/uL  Hepatic function panel  Result Value Ref Range  Total Protein 5.5 (L) 6.5 - 8.1 g/dL   Albumin 2.8 (L) 3.5 - 5.0 g/dL   AST 17 15 - 41 U/L   ALT 19 14 - 54 U/L   Alkaline Phosphatase 123 38 - 126 U/L   Total Bilirubin 0.5 0.3 - 1.2 mg/dL    Bilirubin, Direct 0.2 0.1 - 0.5 mg/dL   Indirect Bilirubin 0.3 0.3 - 0.9 mg/dL  Urinalysis, Routine w reflex microscopic (not at [facility])  Result Value Ref Range   Color, Urine YELLOW YELLOW   APPearance CLOUDY (A) CLEAR   Specific Gravity, Urine 1.027 1.005 - 1.030   pH 5.0 5.0 - 8.0   Glucose, UA NEGATIVE NEGATIVE mg/dL   Hgb urine dipstick MODERATE (A) NEGATIVE   Bilirubin Urine SMALL (A) NEGATIVE   Ketones, ur NEGATIVE NEGATIVE mg/dL   Protein, ur 30 (A) NEGATIVE mg/dL   Urobilinogen, UA 0.2 0.0 - 1.0 mg/dL   Nitrite NEGATIVE NEGATIVE   Leukocytes, UA MODERATE (A) NEGATIVE  Lipase, blood  Result Value Ref Range   Lipase 16 (L) 22 - 51 U/L  Urine microscopic-add on  Result Value Ref Range   Squamous Epithelial / LPF RARE RARE   WBC, UA 11-20 <3 WBC/hpf   RBC / HPF 7-10 <3 RBC/hpf   Bacteria, UA RARE RARE  I-Stat Chem 8, ED  Result Value Ref Range   Sodium 126 (L) 135 - 145 mmol/L   Potassium 4.1 3.5 - 5.1 mmol/L   Chloride 92 (L) 101 - 111 mmol/L   BUN 26 (H) 6 - 20 mg/dL   Creatinine, Ser 1.10 (H) 0.44 - 1.00 mg/dL   Glucose, Bld 85 65 - 99 mg/dL   Calcium, Ion 1.05 (L) 1.13 - 1.30 mmol/L   TCO2 25 0 - 100 mmol/L   Hemoglobin 10.9 (L) 12.0 - 15.0 g/dL   HCT 32.0 (L) 36.0 - 46.0 %  I-stat troponin, ED  Result Value Ref Range   Troponin i, poc 0.01 0.00 - 0.08 ng/mL   Comment 3           Ct Abdomen Pelvis W Contrast  10/04/2014   CLINICAL DATA:  Vomiting.  Emesis.  Complains of abdominal pain  EXAM: CT ABDOMEN AND PELVIS WITH CONTRAST  TECHNIQUE: Multidetector CT imaging of the abdomen and pelvis was performed using the standard protocol following bolus administration of intravenous contrast.  CONTRAST:  5mL OMNIPAQUE IOHEXOL 300 MG/ML  SOLN  COMPARISON:  07/30/2014  FINDINGS: Lower chest: Subsegmental atelectasis is noted in the lung bases. There is an indeterminate filling defect identified within the right atrium measuring 2.3 cm.  Hepatobiliary: No suspicious liver  abnormalities identified. The gallbladder appears normal. There is no biliary dilatation.  Pancreas: Negative  Spleen: Negative  Adrenals/Urinary Tract: The adrenal glands are negative. Bilateral renal cysts are identified. Stone within the inferior pole the left kidney measures 5 mm. The urinary bladder appears normal.  Stomach/Bowel: The stomach is within normal limits. The small bowel loops have a normal course and caliber. No obstruction. Normal appearance of the colon.  Vascular/Lymphatic: Calcified atherosclerotic disease involves the abdominal aorta. No aneurysm. No enlarged retroperitoneal or mesenteric adenopathy. No enlarged pelvic or inguinal lymph nodes.  Reproductive: Previous hysterectomy.  No adnexal mass.  Other: There is no ascites or focal fluid collections within the abdomen or pelvis. Small periumbilical hernia contains fat only.  Musculoskeletal: There is no aggressive lytic or sclerotic bone lesions. The bones appear diffusely osteopenic.  IMPRESSION: 1. No acute findings  identified within the abdomen or pelvis. 2. Filling defect within the right atrium is nonspecific but may represent thrombus. Consider further evaluation with echocardiogram. 3. Aortic atherosclerosis. 4. Bilateral renal cysts. 5. Left renal calculus measures 5 mm.   Electronically Signed   By: Kerby Moors M.D.   On: 10/04/2014 17:29   Dg Abd Acute W/chest  11/01/2014   CLINICAL DATA:  Constant ongoing flank pain. Patient was evaluated on 09/18 for same and diagnosed with a kidney stone. Patient is attempting to pass the stone. Constipation.  EXAM: DG ABDOMEN ACUTE W/ 1V CHEST  COMPARISON:  Chest radiograph 07/30/2014. CT abdomen and pelvis 10/29/2014.  FINDINGS: Shallow inspiration. Cardiac enlargement. Prominent central pulmonary vascularity with suggestion of perihilar infiltration, likely edema. Focal area of more prominent infiltration in the left mid lung could represent superimposed pneumonia or atelectasis. No  blunting of costophrenic angles. No pneumothorax.  Prominent stool-filled rectum. Scattered gas and stool in the colon. No small or large bowel distention. No free intra-abdominal air. No abnormal air-fluid levels. No radiopaque stones. Vascular calcifications. Calcified phleboliths in the pelvis. Degenerative changes in the spine and hips.  IMPRESSION: Cardiac enlargement with probable perihilar edema and focal infiltration or atelectasis in the left mid lung.  Nonobstructive bowel gas pattern with prominent stool-filled rectum. No calcified radiopaque stones identified.   Electronically Signed   By: Lucienne Capers M.D.   On: 11/01/2014 04:43   Ct Renal Stone Study  10/29/2014   CLINICAL DATA:  Possible obstruction  EXAM: CT ABDOMEN AND PELVIS WITHOUT CONTRAST  TECHNIQUE: Multidetector CT imaging of the abdomen and pelvis was performed following the standard protocol without IV contrast.  COMPARISON:  10/04/2014  FINDINGS: Lung bases shows patchy bilateral basilar atelectasis. Cardiomegaly is noted. Sagittal images of the spine shows osteopenia and degenerative changes thoracolumbar spine. There is diffuse osteopenia. Degenerative changes bilateral SI joints. Study is limited by extensive streak artifacts from patient's large body habitus. Unenhanced liver shows no biliary ductal dilatation. No calcified gallstones are noted within gallbladder.  Unenhanced kidneys are symmetrical in size. There is no hydronephrosis. Atherosclerotic calcifications are noted right renal hilum and right renal artery. Nonobstructive calculus in lower pole of the left kidney measures 3 mm. There is a cyst in upper pole of the left kidney measures 3.1 cm. Cyst in midpole lateral aspect of the left kidney measures 2.9 cm. Cyst in lower pole of the left kidney measures 2 cm.  No hydroureter.  No calcified ureteral calculi.  There is no small bowel obstruction. No ascites or free air. No adenopathy. Moderate stool noted throughout the  colon. There is redundant transverse colon. Few diverticula are noted descending colon. Redundant sigmoid colon. Moderate distension of sigmoid colon with gas. Few diverticula are noted sigmoid colon. No evidence of acute diverticulitis. Moderate stool noted in distal sigmoid colon and rectum. Degenerative changes pubic symphysis.  The patient is status post hysterectomy. Mild atherosclerotic calcifications of abdominal aorta.  No pericecal inflammation.  Normal appendix.  IMPRESSION: 1. There is left nonobstructive nephrolithiasis. Bilateral renal cysts. No hydronephrosis or hydroureter. 2. Bilateral renal hilum atherosclerotic calcifications are noted. Mild atherosclerotic calcifications of abdominal aorta. 3. No small bowel obstruction. 4. Moderate stool noted throughout the colon. Redundant transverse colon. Significant redundant sigmoid colon. Moderate stool noted distal sigmoid colon and rectum. 5. Status post hysterectomy.   Electronically Signed   By: Lahoma Crocker M.D.   On: 10/29/2014 15:38    Medications  vancomycin (VANCOCIN) IVPB 1000 mg/200 mL  premix (1,000 mg Intravenous New Bag/Given 11/01/14 0524)  aztreonam (AZACTAM) 1 g in dextrose 5 % 50 mL IVPB (not administered)  sodium chloride 0.9 % bolus 1,000 mL (0 mLs Intravenous Stopped 11/01/14 0448)  haloperidol lactate (HALDOL) injection 1 mg (1 mg Intravenous Given 11/01/14 0522)  haloperidol lactate (HALDOL) injection 1 mg (1 mg Intravenous Given 11/01/14 0547)  ketorolac (TORADOL) 30 MG/ML injection 30 mg (30 mg Intravenous Given 11/01/14 0548)   Will admit to triad for HCAP, per Dr. Blaine Hamper tele I personally performed the services described in this documentation, which was scribed in my presence. The recorded information has been reviewed and is accurate.   EKG Interpretation  Date/Time:  Friday November 01 2014 05:03:24 EDT Ventricular Rate:  83 PR Interval:    QRS Duration: 139 QT Interval:  384 QTC Calculation: 451 R Axis:   -53 Text  Interpretation:  Atrial fibrillation Left bundle branch block Confirmed by [facility]  MD, APRIL ([id number]) on 11/01/2014 6:25:10 AM        Veatrice Kells, MD 11/01/14 1157  Veatrice Kells, MD 11/01/14 [phone]

## 2014-11-01 NOTE — ED Notes (Signed)
hospitalist at the bedside 

## 2014-11-02 ENCOUNTER — Inpatient Hospital Stay (HOSPITAL_COMMUNITY): Payer: Medicare Other

## 2014-11-02 ENCOUNTER — Other Ambulatory Visit (HOSPITAL_COMMUNITY): Payer: Medicare Other

## 2014-11-02 DIAGNOSIS — E871 Hypo-osmolality and hyponatremia: Secondary | ICD-10-CM

## 2014-11-02 DIAGNOSIS — I739 Peripheral vascular disease, unspecified: Secondary | ICD-10-CM

## 2014-11-02 DIAGNOSIS — K5909 Other constipation: Secondary | ICD-10-CM

## 2014-11-02 DIAGNOSIS — N390 Urinary tract infection, site not specified: Secondary | ICD-10-CM

## 2014-11-02 DIAGNOSIS — D509 Iron deficiency anemia, unspecified: Secondary | ICD-10-CM

## 2014-11-02 DIAGNOSIS — L98499 Non-pressure chronic ulcer of skin of other sites with unspecified severity: Secondary | ICD-10-CM

## 2014-11-02 DIAGNOSIS — N183 Chronic kidney disease, stage 3 (moderate): Secondary | ICD-10-CM

## 2014-11-02 LAB — URINE CULTURE: Culture: 1000

## 2014-11-02 LAB — LIPID PANEL
CHOL/HDL RATIO: 2 ratio
Cholesterol: 159 mg/dL (ref 0–200)
HDL: 81 mg/dL (ref >40)
LDL Cholesterol: 61 mg/dL (ref 0–99)
Triglycerides: 85 mg/dL (ref <150)
VLDL: 17 mg/dL (ref 0–40)

## 2014-11-02 LAB — OSMOLALITY: OSMOLALITY: 269 mosm/kg — AB (ref 275–300)

## 2014-11-02 LAB — BASIC METABOLIC PANEL
Anion gap: 9 (ref 5–15)
BUN: 25 mg/dL — ABNORMAL HIGH (ref 6–20)
CHLORIDE: 90 mmol/L — AB (ref 101–111)
CO2: 25 mmol/L (ref 22–32)
CREATININE: 1.24 mg/dL — AB (ref 0.44–1.00)
Calcium: 8.1 mg/dL — ABNORMAL LOW (ref 8.9–10.3)
GFR calc non Af Amer: 41 mL/min — ABNORMAL LOW (ref >60)
GFR, EST AFRICAN AMERICAN: 47 mL/min — AB (ref >60)
GLUCOSE: 88 mg/dL (ref 65–99)
Potassium: 4.2 mmol/L (ref 3.5–5.1)
Sodium: 124 mmol/L — ABNORMAL LOW (ref 135–145)

## 2014-11-02 LAB — BRAIN NATRIURETIC PEPTIDE: B NATRIURETIC PEPTIDE 5: 374.7 pg/mL — AB (ref 0.0–100.0)

## 2014-11-02 LAB — CBC
HCT: 28.3 % — ABNORMAL LOW (ref 36.0–46.0)
HEMOGLOBIN: 9 g/dL — AB (ref 12.0–15.0)
MCH: 33 pg (ref 26.0–34.0)
MCHC: 31.8 g/dL (ref 30.0–36.0)
MCV: 103.7 fL — AB (ref 78.0–100.0)
Platelets: 279 10*3/uL (ref 150–400)
RBC: 2.73 MIL/uL — AB (ref 3.87–5.11)
RDW: 16.9 % — ABNORMAL HIGH (ref 11.5–15.5)
WBC: 9.3 10*3/uL (ref 4.0–10.5)

## 2014-11-02 LAB — GLUCOSE, CAPILLARY: Glucose-Capillary: 84 mg/dL (ref 65–99)

## 2014-11-02 LAB — CORTISOL-AM, BLOOD: Cortisol - AM: 13.8 ug/dL (ref 6.7–22.6)

## 2014-11-02 MED ORDER — SODIUM CHLORIDE 0.9 % IV SOLN
INTRAVENOUS | Status: AC
  Administered 2014-11-02 (×2): via INTRAVENOUS

## 2014-11-02 NOTE — Progress Notes (Signed)
[facility] PROGRESS NOTE  [name] TKP:[id number] DOB: 1934-10-19 DOA: 11/01/2014 PCP: Osborne Casco, MD  Brief narrative 80 year old female with history of PVD status post recent  PTA L SFA and peroneal artery by Dr. Bridgett Larsson 09/25/14), hypertension, diet-controlled diabetes mellitus, severe hearing loss, GERD, diastolic congestive heart failure, bladder prolapse, mitral valve regurgitation, atrial fibrillation (not on anticoagulant due to GI bleeding), iron deficiency anemia, GI bleeding, chronic respiratory failure on 2 L oxygen at home, CKD-II, recurrent UTI, morbid obesity, who is mostly bedbound since December 2015 (after she had left ankle fracture and possibly peripheral vascular disease) was brought to the ED by her daughter with right flank pain, dysuria, increased urinary frequency and altered mental status. Patient also complained of some chest discomfort. She was somnolent on presentation, had stable vitals and workup showed UA suggestive of UTI, hyponatremia with sodium 126, possible focal infiltration in the left mid lung on chest x-ray and drop in hemoglobin to 9.7 (from baseline of 12.5) Patient admitted for further management.  Assessment/Plan: Acute pyelonephritis Continue empiric aztreonam. Left nonobstructive nephrolithiasis on CT scan from 9/20. No hydronephrosis noted. Follow urine culture..   history of recurrent UTI.  Acute encephalopathy Secondary to pyelonephritis and possible pneumonia. Clinically improving. Avoid narcotics or benzos.   Possible healthcare associated pneumonia On empiric vancomycin and aztreonam. Follow blood culture.  Hyponatremia Sepsis secondary to dehydration vs  SIADH with pneumonia. . Monitor urine output closely.  Check urine Na   and osm. Maintain fluid restriction with by mouth intake.  Iron deficiency anemia with drop in H&H Hemoglobin dropped to 9 from baseline of 12.5. Has history of GI bleed. Check stool for occult  blood. Monitor H&H closely. Continue PPI twice a day Followed by [facility] as outpatient.  Coagulase-negative +ve blood cx In 1/2 bottles. Which is possibly a contaminant. Follow repeat blood culture results.  Chest pain on admission Denies any symptoms at this time. EKG unremarkable. Troponin peaked at 0.05. Will check 2-D echo.  Peripheral vascular disease Status post PTA L SFA and peroneal artery by Dr. Bridgett Larsson 09/25/14. Continue Plavix and statin. Has a clean superficial ulcer over the left shin. Follows with wound care clinic in [place]. Appreciate wound care evaluation while inpatient.  Diastolic CHF Appears euvolemic. Discontinue metolazone for now given hyponatremia. Received IV fluids in the ED.  A. fib Not on anticoagulation due to GI bleed. Continue metoprolol.  Chronic respiratory failure Continue O2 via nasal cannula (on 2 L O2 at home)  Morbid obesity  Chronic kidney disease stage II Renal function at baseline   Constipation with high stool burden Continue lactulose and MiraLAX. Had good bowel movement with soapsuds enema. Complains of abdominal pain. Abdominal x-ray with nonobstructive bowel gas pattern.  Chronic arthritis On chronic low-dose prednisone as outpatient.   DVT prophylaxis: Subcutaneous heparin  Diet: Nothing by mouth until mentation improves     Code Status: Full Code Family Communication: Daughter at bedside Disposition Plan: PT evaluation. Home Once improved   Consultants:  None  Procedures:  CT abdomen (renal stone study)  Antibiotics:  IV vancomycin and aztreonam  HPI/Subjective: Admission H&P reviewed. Patient is alert and answering questions. (Very hard of hearing)  Objective: Filed Vitals:   11/02/14 0606  BP: 114/78  Pulse: 79  Temp: 99 F (37.2 C)  Resp: 20    Intake/Output Summary (Last 24 hours) at 11/02/14 1024 Last data filed at 11/02/14 0903  Gross per 24 hour  Intake  0 ml  Output    150 ml  Net    -150 ml   Filed Weights   11/01/14 0911 11/02/14 0610  Weight: 106.2 kg (234 lb 2.1 oz) 105.507 kg (232 lb 9.6 oz)    Exam:   General:  Elderly female in no acute distress, extremely hard of hearing  HEENT: No pallor, moist oral mucosa, supple neck  Chest: Diminished breath sounds bilaterally due to body habitus  CVS: Normal S1 and S2, no murmurs rub or gallop   GI Soft, nondistended, abdominal tenderness (diffuse), bowel sounds present  Musculoskeletal: Warm, no edema, ulcer left tibia (chronic)  CNS: Somnolent, very hard of hearing, chronic left-sided weakness  Data Reviewed: Basic Metabolic Panel:  Recent Labs Lab 11/01/14 0327 11/02/14 0559  NA 126* 124*  K 4.1 4.2  CL 92* 90*  CO2  --  25  GLUCOSE 85 88  BUN 26* 25*  CREATININE 1.10* 1.24*  CALCIUM  --  8.1*   Liver Function Tests:  Recent Labs Lab 11/01/14 0323  AST 17  ALT 19  ALKPHOS 123  BILITOT 0.5  PROT 5.5*  ALBUMIN 2.8*    Recent Labs Lab 11/01/14 0323  LIPASE 16*    Recent Labs Lab 11/01/14 0532  AMMONIA 26   CBC:  Recent Labs Lab 11/01/14 0323 11/01/14 0327 11/01/14 0656 11/02/14 0559  WBC 9.9  --  9.2 9.3  NEUTROABS 8.0*  --   --   --   HGB 9.7* 10.9* 9.1* 9.0*  HCT 30.8* 32.0* 28.6* 28.3*  MCV 102.3*  --  103.2* 103.7*  PLT 316  --  283 279   Cardiac Enzymes:  Recent Labs Lab 11/01/14 0656 11/01/14 1340 11/01/14 1934  TROPONINI <0.03 0.05* <0.03   BNP (last 3 results)  Recent Labs  09/20/14 0838 11/02/14 0559  BNP 332.2* 374.7*    ProBNP (last 3 results)  Recent Labs  11/28/13 1110  PROBNP 2108.0*    CBG:  Recent Labs Lab 11/01/14 0955 11/02/14 0843  GLUCAP 77 84    Recent Results (from the past 240 hour(s))  Blood culture (routine x 2)     Status: None (Preliminary result)   Collection Time: 11/01/14  5:35 AM  Result Value Ref Range Status   Specimen Description BLOOD RIGHT ANTECUBITAL  Final   Special Requests BOTTLES DRAWN  AEROBIC AND ANAEROBIC 10CC  Final   Culture  Setup Time   Final    GRAM POSITIVE COCCI IN CLUSTERS ANAEROBIC BOTTLE ONLY CRITICAL RESULT CALLED TO, READ BACK BY AND VERIFIED WITH: EUNICE @0109  11/02/14 MKELLY    Culture   Final    STAPHYLOCOCCUS SPECIES (COAGULASE NEGATIVE) THE SIGNIFICANCE OF ISOLATING THIS ORGANISM FROM A SINGLE SET OF BLOOD CULTURES WHEN MULTIPLE SETS ARE DRAWN IS UNCERTAIN. PLEASE NOTIFY THE MICROBIOLOGY DEPARTMENT WITHIN ONE WEEK IF SPECIATION AND SENSITIVITIES ARE REQUIRED.    Report Status PENDING  Incomplete  MRSA PCR Screening     Status: None   Collection Time: 11/01/14  9:36 AM  Result Value Ref Range Status   MRSA by PCR NEGATIVE NEGATIVE Final    Comment:        The GeneXpert MRSA Assay (FDA approved for NASAL specimens only), is one component of a comprehensive MRSA colonization surveillance program. It is not intended to diagnose MRSA infection nor to guide or monitor treatment for MRSA infections.      Studies: Dg Abd Acute W/chest  11/01/2014   CLINICAL DATA:  Constant ongoing  flank pain. Patient was evaluated on 09/18 for same and diagnosed with a kidney stone. Patient is attempting to pass the stone. Constipation.  EXAM: DG ABDOMEN ACUTE W/ 1V CHEST  COMPARISON:  Chest radiograph 07/30/2014. CT abdomen and pelvis 10/29/2014.  FINDINGS: Shallow inspiration. Cardiac enlargement. Prominent central pulmonary vascularity with suggestion of perihilar infiltration, likely edema. Focal area of more prominent infiltration in the left mid lung could represent superimposed pneumonia or atelectasis. No blunting of costophrenic angles. No pneumothorax.  Prominent stool-filled rectum. Scattered gas and stool in the colon. No small or large bowel distention. No free intra-abdominal air. No abnormal air-fluid levels. No radiopaque stones. Vascular calcifications. Calcified phleboliths in the pelvis. Degenerative changes in the spine and hips.  IMPRESSION: Cardiac  enlargement with probable perihilar edema and focal infiltration or atelectasis in the left mid lung.  Nonobstructive bowel gas pattern with prominent stool-filled rectum. No calcified radiopaque stones identified.   Electronically Signed   By: Lucienne Capers M.D.   On: 11/01/2014 04:43   Dg Abd Portable 1v  11/02/2014   CLINICAL DATA:  Left flank pain.  Kidney stone.  Increased lethargy.  EXAM: PORTABLE ABDOMEN - 1 VIEW  COMPARISON:  11/01/2014  FINDINGS: Gas is present throughout loops of nondilated bowel without evidence of obstruction. Limited evaluation for free air given supine technique. Tiny left renal stone on recent CT is not well seen on this radiograph. Vascular calcifications are noted.  IMPRESSION: Nonobstructed bowel-gas pattern.   Electronically Signed   By: Logan Bores M.D.   On: 11/02/2014 09:04    Scheduled Meds: . antiseptic oral rinse  7 mL Mouth Rinse q12n4p  . atorvastatin  40 mg Oral q1800  . aztreonam  1 g Intravenous 3 times per day  . carvedilol  3.125 mg Oral BID WC  . chlorhexidine  15 mL Mouth Rinse BID  . feeding supplement (PRO-STAT SUGAR FREE 64)  30 mL Oral BID  . ferrous sulfate  325 mg Oral Daily  . HYDROcodone-acetaminophen  1 tablet Oral 3 times per day  . hydrocortisone-pramoxine  1 applicator Rectal BID  . lactulose  10 g Oral BID  . lubiprostone  24 mcg Oral BID WC  . pantoprazole  40 mg Oral BID  . predniSONE  10 mg Oral Q breakfast  . senna-docusate  1 tablet Oral Daily  . vancomycin  750 mg Intravenous Q12H   Continuous Infusions:     Time spent: 35 minutes    DHUNGEL, [place]  [facility] Pager [phone] If 7PM-7AM, please contact night-coverage at [email or web address], password [facility] 11/02/2014, 10:24 AM  LOS: 1 day

## 2014-11-02 NOTE — Progress Notes (Signed)
PT Cancellation Note  Patient Details Name: [name] MRN: [id number] DOB: 09/11/1934   Cancelled Treatment:    Reason Eval/Treat Not Completed: Patient not medically ready. Pt with order for strict bedrest. Please update, when appropriate, for PT to proceed with eval.   Lorriane Shire 11/02/2014, 8:50 AM

## 2014-11-02 NOTE — Progress Notes (Signed)
PT Cancellation Note  Patient Details Name: [name] MRN: [id number] DOB: February 14, 1934   Cancelled Treatment:    Reason Eval/Treat Not Completed: PT screened, no needs identified, will sign off. Upon chart review and conversation with RN, pt determined to be bedbound for greater than one year. No acute PT services indicated.   Lorriane Shire 11/02/2014, 2:10 PM

## 2014-11-03 ENCOUNTER — Inpatient Hospital Stay (HOSPITAL_COMMUNITY): Payer: Medicare Other

## 2014-11-03 DIAGNOSIS — R079 Chest pain, unspecified: Secondary | ICD-10-CM

## 2014-11-03 LAB — CBC
HEMATOCRIT: 29.2 % — AB (ref 36.0–46.0)
HEMOGLOBIN: 9.2 g/dL — AB (ref 12.0–15.0)
MCH: 32.2 pg (ref 26.0–34.0)
MCHC: 31.5 g/dL (ref 30.0–36.0)
MCV: 102.1 fL — AB (ref 78.0–100.0)
PLATELETS: 297 10*3/uL (ref 150–400)
RBC: 2.86 MIL/uL — AB (ref 3.87–5.11)
RDW: 16.4 % — ABNORMAL HIGH (ref 11.5–15.5)
WBC: 8.8 10*3/uL (ref 4.0–10.5)

## 2014-11-03 LAB — BASIC METABOLIC PANEL
ANION GAP: 9 (ref 5–15)
BUN: 29 mg/dL — ABNORMAL HIGH (ref 6–20)
CHLORIDE: 92 mmol/L — AB (ref 101–111)
CO2: 24 mmol/L (ref 22–32)
Calcium: 8.3 mg/dL — ABNORMAL LOW (ref 8.9–10.3)
Creatinine, Ser: 1.18 mg/dL — ABNORMAL HIGH (ref 0.44–1.00)
GFR calc non Af Amer: 43 mL/min — ABNORMAL LOW (ref >60)
GFR, EST AFRICAN AMERICAN: 50 mL/min — AB (ref >60)
Glucose, Bld: 77 mg/dL (ref 65–99)
POTASSIUM: 4.4 mmol/L (ref 3.5–5.1)
SODIUM: 125 mmol/L — AB (ref 135–145)

## 2014-11-03 LAB — GLUCOSE, CAPILLARY: Glucose-Capillary: 74 mg/dL (ref 65–99)

## 2014-11-03 LAB — SODIUM, URINE, RANDOM

## 2014-11-03 LAB — VANCOMYCIN, TROUGH: Vancomycin Tr: 43 ug/mL (ref 10.0–20.0)

## 2014-11-03 LAB — OSMOLALITY, URINE: Osmolality, Ur: 396 mOsm/kg (ref 390–1090)

## 2014-11-03 LAB — CREATININE, URINE, RANDOM: CREATININE, URINE: 115 mg/dL

## 2014-11-03 LAB — OCCULT BLOOD X 1 CARD TO LAB, STOOL: Fecal Occult Bld: NEGATIVE

## 2014-11-03 NOTE — Progress Notes (Signed)
  Echocardiogram 2D Echocardiogram has been performed.  Aggie Cosier 11/03/2014, 2:38 PM

## 2014-11-03 NOTE — Progress Notes (Signed)
[facility] PROGRESS NOTE  [name] OBS:[id number] DOB: [birth year] DOA: 11/01/2014 PCP: Osborne Casco, MD  Brief narrative 80 year old female with history of PVD status post recent  PTA L SFA and peroneal artery by Dr. Bridgett Larsson 09/25/14), hypertension, diet-controlled diabetes mellitus, severe hearing loss, GERD, diastolic congestive heart failure, bladder prolapse, mitral valve regurgitation, atrial fibrillation (not on anticoagulant due to GI bleeding), iron deficiency anemia, GI bleeding, chronic respiratory failure on 2 L oxygen at home, CKD-II, recurrent UTI, morbid obesity, who is mostly bedbound since December 2015 (after she had left ankle fracture and possibly peripheral vascular disease) was brought to the ED by her daughter with right flank pain, dysuria, increased urinary frequency and altered mental status. Patient also complained of some chest discomfort. She was somnolent on presentation, had stable vitals and workup showed UA suggestive of UTI, hyponatremia with sodium 126, possible focal infiltration in the left mid lung on chest x-ray and drop in hemoglobin to 9.7 (from baseline of 12.5) Patient admitted for further management.  Assessment/Plan: Acute pyelonephritis Continue empiric aztreonam. Left nonobstructive nephrolithiasis on CT scan from 9/20. No hydronephrosis noted. Follow urine culture..   history of recurrent UTI.  Acute encephalopathy Secondary to pyelonephritis and possible pneumonia. Now resolved. Avoid narcotics or benzos.   Possible healthcare associated pneumonia On empiric vancomycin and aztreonam. Blood culture growing coag negative staph which appears to be a contaminant.  Hyponatremia  secondary to dehydration vs  SIADH with pneumonia. . Monitor urine output closely.  Check urine Na<1   and osm. Maintain fluid restriction with by mouth intake.  Iron deficiency anemia with drop in H&H Hemoglobin dropped to 9 from baseline of 12.5. Has  history of GI bleed. Stool for occult blood negative.. Monitor H&H closely. Continue PPI twice a day Followed by [facility] as outpatient.  Coagulase-negative +ve blood cx In 1/2 bottles. Which is possibly a contaminant. Follow repeat blood culture results.  Chest pain on admission Denies any symptoms at this time. EKG unremarkable. Troponin peaked at 0.05. Repeat echo pending.  Peripheral vascular disease Status post PTA L SFA and peroneal artery by Dr. Bridgett Larsson 09/25/14. Continue Plavix and statin. Has a clean superficial ulcer over the left shin. Follows with wound care clinic in [place]. Appreciate wound care evaluation while inpatient.  Diastolic CHF Appears euvolemic. Discontinue metolazone for now given hyponatremia. Getting gentle hydration.  A. fib Not on anticoagulation due to GI bleed. Continue metoprolol.  Chronic respiratory failure Continue O2 via nasal cannula (on 2 L O2 at home)  Morbid obesity  Chronic kidney disease stage II Renal function at baseline   Constipation with high stool burden Continue lactulose and MiraLAX. Improved. Chronic arthritis On chronic low-dose prednisone as outpatient.   DVT prophylaxis: Subcutaneous heparin  Diet: dys 1 with fluid restrcition     Code Status: Full Code Family Communication: Daughter at bedside Disposition Plan: PT evaluation. Home Once improved   Consultants:  None  Procedures:  CT abdomen (renal stone study)  Antibiotics:  IV vancomycin and aztreonam  HPI/Subjective: Admission H&P reviewed. Patient is alert and answering questions. (Very hard of hearing)  Objective: Filed Vitals:   11/03/14 0542  BP: 108/69  Pulse: 73  Temp: 98.5 F (36.9 C)  Resp: 18    Intake/Output Summary (Last 24 hours) at 11/03/14 1036 Last data filed at 11/03/14 0900  Gross per 24 hour  Intake    950 ml  Output    400 ml  Net    550 ml  Filed Weights   11/01/14 0911 11/02/14 0610 11/03/14 0636  Weight: 106.2  kg (234 lb 2.1 oz) 105.507 kg (232 lb 9.6 oz) 108.6 kg (239 lb 6.7 oz)    Exam:   General:  Elderly female in no acute distress, extremely hard of hearing  HEENT:  moist oral mucosa, supple neck  Chest: Diminished breath sounds bilaterally due to body habitus  CVS: Normal S1 and S2, no murmurs rub or gallop   GI Soft, nondistended, nontender, bowel sounds present  Musculoskeletal: Warm, no edema, ulcer left tibia (chronic)  CNS: Alert and awake, oriented at baseline , very hard of hearing, chronic left-sided weakness  Data Reviewed: Basic Metabolic Panel:  Recent Labs Lab 11/01/14 0327 11/02/14 0559 11/03/14 0900  NA 126* 124* 125*  K 4.1 4.2 4.4  CL 92* 90* 92*  CO2  --  25 24  GLUCOSE 85 88 77  BUN 26* 25* 29*  CREATININE 1.10* 1.24* 1.18*  CALCIUM  --  8.1* 8.3*   Liver Function Tests:  Recent Labs Lab 11/01/14 0323  AST 17  ALT 19  ALKPHOS 123  BILITOT 0.5  PROT 5.5*  ALBUMIN 2.8*    Recent Labs Lab 11/01/14 0323  LIPASE 16*    Recent Labs Lab 11/01/14 0532  AMMONIA 26   CBC:  Recent Labs Lab 11/01/14 0323 11/01/14 0327 11/01/14 0656 11/02/14 0559 11/03/14 0900  WBC 9.9  --  9.2 9.3 8.8  NEUTROABS 8.0*  --   --   --   --   HGB 9.7* 10.9* 9.1* 9.0* 9.2*  HCT 30.8* 32.0* 28.6* 28.3* 29.2*  MCV 102.3*  --  103.2* 103.7* 102.1*  PLT 316  --  283 279 297   Cardiac Enzymes:  Recent Labs Lab 11/01/14 0656 11/01/14 1340 11/01/14 1934  TROPONINI <0.03 0.05* <0.03   BNP (last 3 results)  Recent Labs  09/20/14 0838 11/02/14 0559  BNP 332.2* 374.7*    ProBNP (last 3 results)  Recent Labs  11/28/13 1110  PROBNP 2108.0*    CBG:  Recent Labs Lab 11/01/14 0955 11/02/14 0843 11/03/14 0731  GLUCAP 77 84 74    Recent Results (from the past 240 hour(s))  Urine culture     Status: None   Collection Time: 11/01/14  4:17 AM  Result Value Ref Range Status   Specimen Description URINE, CATHETERIZED  Final   Special  Requests ADDED [id number] [phone]  Final   Culture 1,000 COLONIES/mL INSIGNIFICANT GROWTH  Final   Report Status 11/02/2014 FINAL  Final  Blood culture (routine x 2)     Status: None (Preliminary result)   Collection Time: 11/01/14  5:35 AM  Result Value Ref Range Status   Specimen Description BLOOD RIGHT ANTECUBITAL  Final   Special Requests BOTTLES DRAWN AEROBIC AND ANAEROBIC 10CC  Final   Culture  Setup Time   Final    GRAM POSITIVE COCCI IN CLUSTERS IN BOTH AEROBIC AND ANAEROBIC BOTTLES CRITICAL RESULT CALLED TO, READ BACK BY AND VERIFIED WITH: EUNICE @0109  11/02/14 MKELLY GRAM POSITIVE RODS AEROBIC BOTTLE ONLY    Culture   Final    STAPHYLOCOCCUS SPECIES (COAGULASE NEGATIVE) THE SIGNIFICANCE OF ISOLATING THIS ORGANISM FROM A SINGLE SET OF BLOOD CULTURES WHEN MULTIPLE SETS ARE DRAWN IS UNCERTAIN. PLEASE NOTIFY THE MICROBIOLOGY DEPARTMENT WITHIN ONE WEEK IF SPECIATION AND SENSITIVITIES ARE REQUIRED. CULTURE REINCUBATED FOR BETTER GROWTH    Report Status PENDING  Incomplete  Blood culture (routine x 2)     Status:  None (Preliminary result)   Collection Time: 11/01/14  5:40 AM  Result Value Ref Range Status   Specimen Description BLOOD HAND RIGHT  Final   Special Requests BOTTLES DRAWN AEROBIC AND ANAEROBIC 5CC  Final   Culture NO GROWTH 1 DAY  Final   Report Status PENDING  Incomplete  MRSA PCR Screening     Status: None   Collection Time: 11/01/14  9:36 AM  Result Value Ref Range Status   MRSA by PCR NEGATIVE NEGATIVE Final    Comment:        The GeneXpert MRSA Assay (FDA approved for NASAL specimens only), is one component of a comprehensive MRSA colonization surveillance program. It is not intended to diagnose MRSA infection nor to guide or monitor treatment for MRSA infections.      Studies: Dg Abd Portable 1v  11/02/2014   CLINICAL DATA:  Left flank pain.  Kidney stone.  Increased lethargy.  EXAM: PORTABLE ABDOMEN - 1 VIEW  COMPARISON:  11/01/2014  FINDINGS: Gas is  present throughout loops of nondilated bowel without evidence of obstruction. Limited evaluation for free air given supine technique. Tiny left renal stone on recent CT is not well seen on this radiograph. Vascular calcifications are noted.  IMPRESSION: Nonobstructed bowel-gas pattern.   Electronically Signed   By: Logan Bores M.D.   On: 11/02/2014 09:04    Scheduled Meds: . antiseptic oral rinse  7 mL Mouth Rinse q12n4p  . atorvastatin  40 mg Oral q1800  . aztreonam  1 g Intravenous 3 times per day  . carvedilol  3.125 mg Oral BID WC  . chlorhexidine  15 mL Mouth Rinse BID  . feeding supplement (PRO-STAT SUGAR FREE 64)  30 mL Oral BID  . ferrous sulfate  325 mg Oral Daily  . HYDROcodone-acetaminophen  1 tablet Oral 3 times per day  . hydrocortisone-pramoxine  1 applicator Rectal BID  . lactulose  10 g Oral BID  . lubiprostone  24 mcg Oral BID WC  . pantoprazole  40 mg Oral BID  . predniSONE  10 mg Oral Q breakfast  . senna-docusate  1 tablet Oral Daily  . vancomycin  750 mg Intravenous Q12H   Continuous Infusions: . sodium chloride 75 mL/hr at 11/02/14 1927      Time spent: 25 minutes    Louellen Molder  [facility] Pager [phone] If 7PM-7AM, please contact night-coverage at [email or web address], password [facility] 11/03/2014, 10:36 AM  LOS: 2 days

## 2014-11-04 LAB — BASIC METABOLIC PANEL
Anion gap: 10 (ref 5–15)
BUN: 26 mg/dL — AB (ref 6–20)
CHLORIDE: 95 mmol/L — AB (ref 101–111)
CO2: 21 mmol/L — AB (ref 22–32)
Calcium: 8.4 mg/dL — ABNORMAL LOW (ref 8.9–10.3)
Creatinine, Ser: 0.99 mg/dL (ref 0.44–1.00)
GFR calc Af Amer: >60 mL/min (ref >60)
GFR calc non Af Amer: 53 mL/min — ABNORMAL LOW (ref >60)
GLUCOSE: 69 mg/dL (ref 65–99)
POTASSIUM: 3.8 mmol/L (ref 3.5–5.1)
Sodium: 126 mmol/L — ABNORMAL LOW (ref 135–145)

## 2014-11-04 LAB — CBC
HEMATOCRIT: 29.2 % — AB (ref 36.0–46.0)
HEMOGLOBIN: 9.1 g/dL — AB (ref 12.0–15.0)
MCH: 31.7 pg (ref 26.0–34.0)
MCHC: 31.2 g/dL (ref 30.0–36.0)
MCV: 101.7 fL — AB (ref 78.0–100.0)
Platelets: 308 10*3/uL (ref 150–400)
RBC: 2.87 MIL/uL — AB (ref 3.87–5.11)
RDW: 16.5 % — ABNORMAL HIGH (ref 11.5–15.5)
WBC: 9 10*3/uL (ref 4.0–10.5)

## 2014-11-04 LAB — GLUCOSE, CAPILLARY: Glucose-Capillary: 75 mg/dL (ref 65–99)

## 2014-11-04 LAB — CULTURE, BLOOD (ROUTINE X 2)

## 2014-11-04 MED ORDER — SODIUM CHLORIDE 0.9 % IV SOLN
INTRAVENOUS | Status: AC
Start: 2014-11-04 — End: 2014-11-05
  Administered 2014-11-04 – 2014-11-05 (×2): via INTRAVENOUS

## 2014-11-04 MED ORDER — ADULT MULTIVITAMIN W/MINERALS CH
1.0000 | ORAL_TABLET | Freq: Every day | ORAL | Status: DC
  Administered 2014-11-04: 1 via ORAL
  Filled 2014-11-04 (×4): qty 1

## 2014-11-04 NOTE — Plan of Care (Signed)
Problem: Consults Goal: UTI/Pyelonephritis Patient Education See Patient Education Module for education specifics.  Outcome: Progressing Pt disoriented.  Unable to understand education at this time.    Problem: Phase I Progression Outcomes Goal: OOB as tolerated unless otherwise ordered Outcome: Not Progressing Pt bedridden.

## 2014-11-04 NOTE — Progress Notes (Signed)
OT Cancellation Note  Patient Details Name: [name] MRN: [id number] DOB: March 19, 1934   Cancelled Treatment:    Reason Eval/Treat Not Completed: OT screened, no needs identified, will sign off. Pt is dependent in ADL and is bedbound at baseline.  No acute OT needs.   [name] 11/04/2014, 9:39 AM

## 2014-11-04 NOTE — Consult Note (Addendum)
WOC consult requested for open areas on buttocks.  WOC consult was performed on 9/23 with daughter at bedside to assess skin when turning.  Refer to previous progress notes for assessment and plan of care. Please re-consult if further assistance is needed.  Thank-you,  Julien Girt MSN, [place], [place], [place], [place]

## 2014-11-04 NOTE — Progress Notes (Signed)
ANTIBIOTIC CONSULT NOTE  Pharmacy Consult for Aztreonam Indication: HCAP / ? UTI  Allergies  Allergen Reactions  . Ativan [Lorazepam] Itching, Swelling and Rash    Throat, Tongue swelling  . Lexapro [Escitalopram Oxalate] Swelling  . Latex Hives and Rash  . Levaquin [Levofloxacin] Other (See Comments)    Per MAR  . Penicillins Other (See Comments)    Per MAR  . Tetanus-Diphtheria Toxoids Td Other (See Comments)    Per MAR   Labs:  Recent Labs  11/02/14 0559 11/02/14 2320 11/03/14 0900 11/04/14 0341  WBC 9.3  --  8.8 9.0  HGB 9.0*  --  9.2* 9.1*  PLT 279  --  297 308  LABCREA  --  115.00  --   --   CREATININE 1.24*  --  1.18* 0.99   Estimated Creatinine Clearance: 54.1 mL/min (by C-G formula based on Cr of 0.99).  Recent Labs  11/03/14 1641  VANCOTROUGH 43*     Microbiology: Recent Results (from the past 720 hour(s))  Urine culture     Status: None   Collection Time: 11/01/14  4:17 AM  Result Value Ref Range Status   Specimen Description URINE, CATHETERIZED  Final   Special Requests ADDED [id number] [phone]  Final   Culture 1,000 COLONIES/mL INSIGNIFICANT GROWTH  Final   Report Status 11/02/2014 FINAL  Final  Blood culture (routine x 2)     Status: None (Preliminary result)   Collection Time: 11/01/14  5:35 AM  Result Value Ref Range Status   Specimen Description BLOOD RIGHT ANTECUBITAL  Final   Special Requests BOTTLES DRAWN AEROBIC AND ANAEROBIC 10CC  Final   Culture  Setup Time   Final    GRAM POSITIVE COCCI IN CLUSTERS IN BOTH AEROBIC AND ANAEROBIC BOTTLES CRITICAL RESULT CALLED TO, READ BACK BY AND VERIFIED WITH: EUNICE @0109  11/02/14 MKELLY GRAM POSITIVE RODS AEROBIC BOTTLE ONLY    Culture   Final    STAPHYLOCOCCUS SPECIES (COAGULASE NEGATIVE) THE SIGNIFICANCE OF ISOLATING THIS ORGANISM FROM A SINGLE SET OF BLOOD CULTURES WHEN MULTIPLE SETS ARE DRAWN IS UNCERTAIN. PLEASE NOTIFY THE MICROBIOLOGY DEPARTMENT WITHIN ONE WEEK IF SPECIATION AND SENSITIVITIES ARE  REQUIRED. CULTURE REINCUBATED FOR BETTER GROWTH    Report Status PENDING  Incomplete  Blood culture (routine x 2)     Status: None (Preliminary result)   Collection Time: 11/01/14  5:40 AM  Result Value Ref Range Status   Specimen Description BLOOD HAND RIGHT  Final   Special Requests BOTTLES DRAWN AEROBIC AND ANAEROBIC 5CC  Final   Culture NO GROWTH 2 DAYS  Final   Report Status PENDING  Incomplete  MRSA PCR Screening     Status: None   Collection Time: 11/01/14  9:36 AM  Result Value Ref Range Status   MRSA by PCR NEGATIVE NEGATIVE Final    Comment:        The GeneXpert MRSA Assay (FDA approved for NASAL specimens only), is one component of a comprehensive MRSA colonization surveillance program. It is not intended to diagnose MRSA infection nor to guide or monitor treatment for MRSA infections.     Assessment: 80 y.o. female presents from [place] with flank pain. Allergies to both PCN and Levaquin.  Day 4 of aztreonam therapy Cultures negative, Scr has improved off Vancomycin to 0.99, afebrile   Goal of Therapy:  Appropriate Aztreonam dosing  Plan:  Continue Aztreonam 1 gram iv Q 8 hours Continue to follow for LOT  Thank you Anette Guarneri, PharmD [phone] 11/04/2014,10:04  AM

## 2014-11-04 NOTE — Clinical Social Work Note (Signed)
Clinical Social Work Assessment  Patient Details  Name: [name] MRN: [id number] Date of Birth: February 20, 1934  Date of referral:  11/04/14               Reason for consult:  Facility Placement                Permission sought to share information with:  Family Supports, Customer service manager Permission granted to share information::  Yes, Release of Information Signed  Name::     [name]  Agency::  [facility]  Relationship::  Daughter  Contact Information:   [phone]  Housing/Transportation Living arrangements for the past 2 months:  [place] of Information:  Adult Children Patient Interpreter Needed:  None Criminal Activity/Legal Involvement Pertinent to Current Situation/Hospitalization:  No - Comment as needed Significant Relationships:  Adult Children Lives with:  Facility Resident Do you feel safe going back to the place where you live?  No Need for family participation in patient care:  Yes (Comment)  Care giving concerns:  Patient's daughter states that [name] was not providing adequate care needed for her mother and was not satisfied with the care at the facility.    Social Worker assessment / plan:  BSW intern spoke with patient's daughter about patient returning to [place] post discharge. Patient's daughter stated that the patient originally started as a short-term patient at [facility] in April, but has transitioned into long-term care. Patient's daughter expressed that she would like for BSW to research alternate facilities because she was not satisfied with the patient's care at [facility]. BSW intern will search for long-term care facilities for patient and will continue to follow.   Employment status:  Retired Forensic scientist:  Medicare PT Recommendations:  [place] / Referral to community resources:  [place]  Patient/Family's Response to care:  Family is appreciative of  patient care at the hospital and hopes to get patient placed at a new facility.  Patient/Family's Understanding of and Emotional Response to Diagnosis, Current Treatment, and Prognosis:  Patient's daughter wishes that her mother could return home, but understands that her mother's situation requires full-time care. Patient's daughter is hopeful that the patient will be placed in a new long-term care facility.  Emotional Assessment Appearance:  Appears stated age Attitude/Demeanor/Rapport:    Affect (typically observed):    Orientation:   (Not Oriented) Alcohol / Substance use:  Not Applicable Psych involvement (Current and /or in the community):  No (Comment)  Discharge Needs  Concerns to be addressed:  Discharge Planning Concerns Readmission within the last 30 days:  No Current discharge risk:  Dependent with Mobility Barriers to Discharge:  Continued Medical Work up   [employer], Student-SW 11/04/2014, 11:53 AM

## 2014-11-04 NOTE — Clinical Social Work Placement (Signed)
   CLINICAL SOCIAL WORK PLACEMENT  NOTE  Date:  11/04/2014  Patient Details  Name: [name] MRN: [id number] Date of Birth: 1934-02-29  Clinical Social Work is seeking post-discharge placement for this patient at the [place] level of care (*CSW will initial, date and re-position this form in  chart as items are completed):  Yes   Patient/family provided with [facility] Work Department's list of facilities offering this level of care within the geographic area requested by the patient (or if unable, by the patient's family).  Yes   Patient/family informed of their freedom to choose among providers that offer the needed level of care, that participate in Medicare, Medicaid or managed care program needed by the patient, have an available bed and are willing to accept the patient.  Yes   Patient/family informed of [place]'s ownership interest in [facility] and [facility], as well as of the fact that they are under no obligation to receive care at these facilities.  PASRR submitted to EDS on       PASRR number received on       Existing PASRR number confirmed on 11/04/14     FL2 transmitted to all facilities in geographic area requested by pt/family on 11/04/14     FL2 transmitted to all facilities within larger geographic area on       Patient informed that his/her managed care company has contracts with or will negotiate with certain facilities, including the following:            Patient/family informed of bed offers received.  Patient chooses bed at       Physician recommends and patient chooses bed at      Patient to be transferred to   on  .  Patient to be transferred to facility by       Patient family notified on   of transfer.  Name of family member notified:        PHYSICIAN       Additional Comment:    _______________________________________________ Raynelle Highland, Student-SW 11/04/2014, 3:09 PM

## 2014-11-04 NOTE — Progress Notes (Addendum)
[facility] PROGRESS NOTE  [name] MLY:[id number] DOB: November 17, 1934 DOA: 11/01/2014 PCP: Osborne Casco, MD  Brief narrative 80 year old female with history of PVD status post recent  PTA L SFA and peroneal artery by Dr. Bridgett Larsson 09/25/14), hypertension, diet-controlled diabetes mellitus, severe hearing loss, GERD, diastolic congestive heart failure, bladder prolapse, mitral valve regurgitation, atrial fibrillation (not on anticoagulant due to GI bleeding), iron deficiency anemia, GI bleeding, chronic respiratory failure on 2 L oxygen at home, CKD-II, recurrent UTI, morbid obesity, who is mostly bedbound since December 2015 (after she had left ankle fracture and possibly peripheral vascular disease) was brought to the ED by her daughter with right flank pain, dysuria, increased urinary frequency and altered mental status. Patient also complained of some chest discomfort. She was somnolent on presentation, had stable vitals and workup showed UA suggestive of UTI, hyponatremia with sodium 126, possible focal infiltration in the left mid lung on chest x-ray and drop in hemoglobin to 9.7 (from baseline of 12.5) Patient admitted for further management.  Assessment/Plan: Acute pyelonephritis Continue empiric aztreonam. Left nonobstructive nephrolithiasis on CT scan from 9/20. No hydronephrosis noted. Urine culture with insignificant growth.  Acute encephalopathy Secondary to pyelonephritis and possible pneumonia. Now resolved. Avoid narcotics or benzos as much as possible.   Possible healthcare associated pneumonia On empiric vancomycin and aztreonam. Blood culture growing coag negative staph which appears to be a contaminant. Discontinue vancomycin given supratherapeutic level.  Hyponatremia  secondary to dehydration vs  SIADH with pneumonia. . Monitor urine output closely.  Check urine Na<1   and osm. Maintain fluid restriction with by mouth intake.  Iron deficiency anemia with drop  in H&H Hemoglobin dropped to 9 from baseline of 12.5. (Has been stable since admission) . Has history of GI bleed. Stool for occult blood negative.. Monitor H&H closely. Continue PPI twice a day and iron supplement. Followed by [facility] as outpatient.  Coagulase-negative +ve blood cx In 1/2 bottles. Which is possibly a contaminant. Repeat blood culture negative   Chest pain on admission Denies any symptoms at this time. EKG unremarkable. Troponin peaked at 0.05. Repeat echo with EF of 55-6 depression and no wall motion abnormality.  Peripheral vascular disease Status post PTA L SFA and peroneal artery by Dr. Bridgett Larsson 09/25/14. Continue Plavix and statin. Has a clean superficial ulcer over the left shin. Follows with wound care clinic in [place]. Appreciate wound care evaluation while inpatient.  Diastolic CHF Appears euvolemic. Discontinue metolazone for now given hyponatremia. Continue gentle hydration.  A. fib Not on anticoagulation due to GI bleed. Continue metoprolol.  Chronic respiratory failure Continue O2 via nasal cannula (on 2 L O2 at home)  Morbid obesity  Chronic kidney disease stage II Renal function at baseline   Constipation with high stool burden Continue lactulose and MiraLAX. Improved.  Chronic arthritis On chronic low-dose prednisone as outpatient.   DVT prophylaxis: Subcutaneous heparin  Diet: dys 1     Code Status: Full Code Family Communication: None at bedside today. Disposition Plan: PT evaluation. Return to skilled nursing facility and 24-48 hours once sodium is improved.   Consultants:  None  Procedures:  CT abdomen (renal stone study)  Antibiotics:  IV vancomycin and aztreonam  HPI/Subjective: Admission H&P reviewed. Patient is alert and answering questions. (Very hard of hearing)  Objective: Filed Vitals:   11/04/14 0447  BP: 125/79  Pulse: 81  Temp: 98.7 F (37.1 C)  Resp: 18    Intake/Output Summary (Last 24 hours) at  11/04/14 1133  Last data filed at 11/04/14 0447  Gross per 24 hour  Intake    100 ml  Output    450 ml  Net   -350 ml   Filed Weights   11/02/14 0610 11/03/14 0636 11/04/14 0526  Weight: 105.507 kg (232 lb 9.6 oz) 108.6 kg (239 lb 6.7 oz) 107.9 kg (237 lb 14 oz)    Exam:   General:  Elderly obese female, extremely hard of hearing  HEENT:  moist oral mucosa, supple neck  Chest: Diminished breath sounds bilaterally due to body habitus  CVS: Normal S1 and S2, no murmurs rub or gallop   GI Soft, nondistended, nontender, bowel sounds present  Musculoskeletal: Warm, no edema, ulcer left tibia (chronic)  CNS: Alert and awake, oriented at baseline , very hard of hearing, chronic left-sided weakness  Data Reviewed: Basic Metabolic Panel:  Recent Labs Lab 11/01/14 0327 11/02/14 0559 11/03/14 0900 11/04/14 0341  NA 126* 124* 125* 126*  K 4.1 4.2 4.4 3.8  CL 92* 90* 92* 95*  CO2  --  25 24 21*  GLUCOSE 85 88 77 69  BUN 26* 25* 29* 26*  CREATININE 1.10* 1.24* 1.18* 0.99  CALCIUM  --  8.1* 8.3* 8.4*   Liver Function Tests:  Recent Labs Lab 11/01/14 0323  AST 17  ALT 19  ALKPHOS 123  BILITOT 0.5  PROT 5.5*  ALBUMIN 2.8*    Recent Labs Lab 11/01/14 0323  LIPASE 16*    Recent Labs Lab 11/01/14 0532  AMMONIA 26   CBC:  Recent Labs Lab 11/01/14 0323 11/01/14 0327 11/01/14 0656 11/02/14 0559 11/03/14 0900 11/04/14 0341  WBC 9.9  --  9.2 9.3 8.8 9.0  NEUTROABS 8.0*  --   --   --   --   --   HGB 9.7* 10.9* 9.1* 9.0* 9.2* 9.1*  HCT 30.8* 32.0* 28.6* 28.3* 29.2* 29.2*  MCV 102.3*  --  103.2* 103.7* 102.1* 101.7*  PLT 316  --  283 279 297 308   Cardiac Enzymes:  Recent Labs Lab 11/01/14 0656 11/01/14 1340 11/01/14 1934  TROPONINI <0.03 0.05* <0.03   BNP (last 3 results)  Recent Labs  09/20/14 0838 11/02/14 0559  BNP 332.2* 374.7*    ProBNP (last 3 results)  Recent Labs  11/28/13 1110  PROBNP 2108.0*    CBG:  Recent Labs Lab  11/01/14 0955 11/02/14 0843 11/03/14 0731 11/04/14 0742  GLUCAP 77 84 74 75    Recent Results (from the past 240 hour(s))  Urine culture     Status: None   Collection Time: 11/01/14  4:17 AM  Result Value Ref Range Status   Specimen Description URINE, CATHETERIZED  Final   Special Requests ADDED [id number] [phone]  Final   Culture 1,000 COLONIES/mL INSIGNIFICANT GROWTH  Final   Report Status 11/02/2014 FINAL  Final  Blood culture (routine x 2)     Status: None (Preliminary result)   Collection Time: 11/01/14  5:35 AM  Result Value Ref Range Status   Specimen Description BLOOD RIGHT ANTECUBITAL  Final   Special Requests BOTTLES DRAWN AEROBIC AND ANAEROBIC 10CC  Final   Culture  Setup Time   Final    GRAM POSITIVE COCCI IN CLUSTERS IN BOTH AEROBIC AND ANAEROBIC BOTTLES CRITICAL RESULT CALLED TO, READ BACK BY AND VERIFIED WITH: EUNICE @0109  11/02/14 MKELLY GRAM POSITIVE RODS AEROBIC BOTTLE ONLY    Culture   Final    STAPHYLOCOCCUS SPECIES (COAGULASE NEGATIVE) THE SIGNIFICANCE OF ISOLATING THIS ORGANISM  FROM A SINGLE SET OF BLOOD CULTURES WHEN MULTIPLE SETS ARE DRAWN IS UNCERTAIN. PLEASE NOTIFY THE MICROBIOLOGY DEPARTMENT WITHIN ONE WEEK IF SPECIATION AND SENSITIVITIES ARE REQUIRED. CULTURE REINCUBATED FOR BETTER GROWTH    Report Status PENDING  Incomplete  Blood culture (routine x 2)     Status: None (Preliminary result)   Collection Time: 11/01/14  5:40 AM  Result Value Ref Range Status   Specimen Description BLOOD HAND RIGHT  Final   Special Requests BOTTLES DRAWN AEROBIC AND ANAEROBIC 5CC  Final   Culture NO GROWTH 2 DAYS  Final   Report Status PENDING  Incomplete  MRSA PCR Screening     Status: None   Collection Time: 11/01/14  9:36 AM  Result Value Ref Range Status   MRSA by PCR NEGATIVE NEGATIVE Final    Comment:        The GeneXpert MRSA Assay (FDA approved for NASAL specimens only), is one component of a comprehensive MRSA colonization surveillance program. It is  not intended to diagnose MRSA infection nor to guide or monitor treatment for MRSA infections.      Studies: No results found.  Scheduled Meds: . antiseptic oral rinse  7 mL Mouth Rinse q12n4p  . atorvastatin  40 mg Oral q1800  . aztreonam  1 g Intravenous 3 times per day  . carvedilol  3.125 mg Oral BID WC  . chlorhexidine  15 mL Mouth Rinse BID  . feeding supplement (PRO-STAT SUGAR FREE 64)  30 mL Oral BID  . ferrous sulfate  325 mg Oral Daily  . hydrocortisone-pramoxine  1 applicator Rectal BID  . lactulose  10 g Oral BID  . lubiprostone  24 mcg Oral BID WC  . pantoprazole  40 mg Oral BID  . predniSONE  10 mg Oral Q breakfast  . senna-docusate  1 tablet Oral Daily   Continuous Infusions: . sodium chloride 75 mL/hr at 11/04/14 1002      Time spent: 25 minutes    DHUNGEL, NISHANT  [facility] Pager [phone] If 7PM-7AM, please contact night-coverage at [email or web address], password [facility] 11/04/2014, 11:33 AM  LOS: 3 days

## 2014-11-04 NOTE — Progress Notes (Signed)
Initial Nutrition Assessment  DOCUMENTATION CODES:   Morbid obesity  INTERVENTION:   -Continue 30 ml Prostat BID -MVI daily  NUTRITION DIAGNOSIS:   Increased nutrient needs related to wound healing as evidenced by estimated needs.   GOAL:   Patient will meet greater than or equal to 90% of their needs  MONITOR:   PO intake, Supplement acceptance, Labs, Weight trends, Skin, I & O's  REASON FOR ASSESSMENT:   Low Braden    ASSESSMENT:   [name] is a 80 y.o. female with PMH of PVD (s/p of PTA L SFA and peroneal artery by Dr. Bridgett Larsson 09/25/14), hypertension, diet-controlled diabetes mellitus, GERD, diastolic congestive heart failure, bladder prolapse, mitral valve regurgitation, atrial fibrillation (not on anticoagulant due to GI bleeding), iron deficiency anemia, GI bleeding, chronic respiratory failure on 2 L oxygen at home, CKD-II, recurrent UTI, morbid obesity, who presents with right flank pain, dysuria, increased urinary frequency and altered mental status.  Pt admitted with acute pyelonephritis. She is a resident of [facility]   No family present at time of visit. Pt was sleeping soundly; did not arouse to name being called.   Reviewed wt hx, which reveals wt stability over > 1 year.   Reviewed [facility]RN note from 11/01/14; pt has a chronic full thickness wound on rt calf as well as partial thickness wounds on rt and lt hips.  Nutrition-Focused physical exam completed. Findings are no fat depletion, no muscle depletion, and mild edema.   Lunch tray at bedside was unattempted, however, was recently delivered to pt. Meal completion 0%. However, suspect intake will improve once mental status improves.   Labs reviewed: Na: 126 (on IV supplementation).  Diet Order:  DIET - DYS 1 Room service appropriate?: Yes; Fluid consistency:: Thin; Fluid restriction:: 1500 mL Fluid  Skin:  Wound (see comment) (full thickness lt calf, lt/rt hip partial thickness wounds)  Last BM:   11/03/14  Height:   Ht Readings from Last 1 Encounters:  11/01/14 5\' 2"  (1.575 m)    Weight:   Wt Readings from Last 1 Encounters:  11/04/14 237 lb 14 oz (107.9 kg)    Ideal Body Weight:  50 kg  BMI:  Body mass index is 43.5 kg/(m^2).  Estimated Nutritional Needs:   Kcal:  1550-1750  Protein:  75-90 grams  Fluid:  >1.6 L  EDUCATION NEEDS:   No education needs identified at this time  Ferdinando Lodge A. Jimmye Norman, RD, LDN, CDE Pager: [phone] After hours Pager: [phone]

## 2014-11-04 NOTE — Progress Notes (Signed)
PT Cancellation Note  Patient Details Name: [name] MRN: [id number] DOB: [birth year]   Cancelled Treatment:    Reason Eval/Treat Not Completed: PT screened, no needs identified, will sign off (Please see note on 11/02/14.  Pt bedbound PTA and total care.  No PT indicated.)Will not follow.  Thanks.   Carlyss, Erin Obando F 11/04/2014, 2:09 PM [facility] [phone] (pager)

## 2014-11-04 NOTE — Care Management Important Message (Signed)
Important Message  Patient Details  Name: [name] MRN: [id number] Date of Birth: 06-Sep-1934   Medicare Important Message Given:  Yes-second notification given    Delorse Lek 11/04/2014, 3:26 PM

## 2014-11-05 LAB — BASIC METABOLIC PANEL
ANION GAP: 9 (ref 5–15)
BUN: 34 mg/dL — ABNORMAL HIGH (ref 6–20)
CHLORIDE: 95 mmol/L — AB (ref 101–111)
CO2: 21 mmol/L — AB (ref 22–32)
CREATININE: 0.97 mg/dL (ref 0.44–1.00)
Calcium: 8 mg/dL — ABNORMAL LOW (ref 8.9–10.3)
GFR calc non Af Amer: 55 mL/min — ABNORMAL LOW (ref >60)
Glucose, Bld: 89 mg/dL (ref 65–99)
Potassium: 3.3 mmol/L — ABNORMAL LOW (ref 3.5–5.1)
SODIUM: 125 mmol/L — AB (ref 135–145)

## 2014-11-05 LAB — CBC
HEMATOCRIT: 29.2 % — AB (ref 36.0–46.0)
Hemoglobin: 9.3 g/dL — ABNORMAL LOW (ref 12.0–15.0)
MCH: 32.9 pg (ref 26.0–34.0)
MCHC: 31.8 g/dL (ref 30.0–36.0)
MCV: 103.2 fL — AB (ref 78.0–100.0)
PLATELETS: 265 10*3/uL (ref 150–400)
RBC: 2.83 MIL/uL — ABNORMAL LOW (ref 3.87–5.11)
RDW: 16.4 % — AB (ref 11.5–15.5)
WBC: 8 10*3/uL (ref 4.0–10.5)

## 2014-11-05 LAB — GLUCOSE, CAPILLARY: GLUCOSE-CAPILLARY: 88 mg/dL (ref 65–99)

## 2014-11-05 MED ORDER — POTASSIUM CHLORIDE CRYS ER 20 MEQ PO TBCR
40.0000 meq | EXTENDED_RELEASE_TABLET | Freq: Once | ORAL | Status: DC
  Filled 2014-11-05: qty 2

## 2014-11-05 MED ORDER — FUROSEMIDE 10 MG/ML IJ SOLN
40.0000 mg | Freq: Four times a day (QID) | INTRAMUSCULAR | Status: DC
  Administered 2014-11-05 – 2014-11-08 (×13): 40 mg via INTRAVENOUS
  Filled 2014-11-05 (×13): qty 4

## 2014-11-05 NOTE — Consult Note (Signed)
[facility] CONSULT NOTE    Date: 11/05/2014                  Patient Name:  [name]  MRN: [id number]  DOB: Jan 06, 1935  Age / Sex: 80 y.o., female         PCP: Osborne Casco, MD                 Service Requesting Consult: [facility]                  Reason for Consult: Hyponatremia             History of Present Illness: [name] is a 80 y.o. woman with a PMHx of chronic combined systolic and diastolic HF (EF 86-57% per 11/04/14 echo), atrial fibrillation not on AC, type 2 DM, iron deficiency anemia, and CKD Stage II who was admitted to [facility] on 11/01/2014 for evaluation of left flank pain, dysuria, urinary frequency, and altered mental status. Patient was found to have pyelonephritis and started on Vancomycin and Aztreonam empirically. Patient also had a CT Renal Stone Study 9/20 which showed a left nonobstructive kidney stone (51mm). Urine culture with insignificant growth. Patient initially with mild AKI with Cr up 1.24, but now back to baseline of 0.9. She was also noted to have possible HCAP. Blood cultures grew coagulase negative staph which was determined to be a contaminant. Vancomycin currently on hold due to supratherapeutic level.   Patient has had hyponatremia since admission with Na 126. Last Na prior to this admission was normal at 139 on 8/17. Na has remained in 124-126 range over past few days despite holding her home metolazone and restricting fluid intake. Urine Na <10, urine osmolality normal at 396, and plasma osmolality low at 269. Triglycerides normal at 85. Last TSH 2.50 in June 2015.   Medications: Outpatient medications: Prescriptions prior to admission  Medication Sig Dispense Refill Last Dose  . albuterol (PROVENTIL) (2.5 MG/3ML) 0.083% nebulizer solution Take 2.5 mg by nebulization every 4 (four) hours as needed for wheezing or shortness of breath (For wheezing and shortness of breath).    unknown  . Amino Acids-Protein  Hydrolys (FEEDING SUPPLEMENT, PRO-STAT SUGAR FREE 64,) LIQD Take 30 mLs by mouth 2 (two) times daily.   10/31/2014 at Unknown time  . atorvastatin (LIPITOR) 40 MG tablet Take 40 mg by mouth daily.   10/31/2014 at Unknown time  . bisacodyl (DULCOLAX) 10 MG suppository Place 10 mg rectally daily as needed for moderate constipation (if constipation not relieved by MOM).   unknown  . carvedilol (COREG) 3.125 MG tablet Take 3.125 mg by mouth 2 (two) times daily with a meal. Hold if SBP <100   10/31/2014 at 1700  . ferrous sulfate 325 (65 FE) MG tablet Take 325 mg by mouth daily.   10/31/2014 at Unknown time  . HYDROcodone-acetaminophen (NORCO/VICODIN) 5-325 MG per tablet Take 1 tablet by mouth 3 (three) times daily as needed (pain). 15 tablet 0 Past Week at Unknown time  . HYDROcodone-acetaminophen (NORCO/VICODIN) 5-325 MG per tablet Take 1 tablet by mouth every 8 (eight) hours.   10/31/2014 at Unknown time  . lactulose (CHRONULAC) 10 GM/15ML solution Take 10 g by mouth 2 (two) times daily.    10/31/2014 at Unknown time  . levalbuterol (XOPENEX) 0.63 MG/3ML nebulizer solution Take 0.63 mg by nebulization every 4 (four) hours as needed for wheezing or shortness of breath.   unknown  . lubiprostone (AMITIZA) 24  MCG capsule Take 1 capsule (24 mcg total) by mouth 2 (two) times daily with a meal. (Patient taking differently: Take 24 mcg by mouth 2 (two) times daily with a meal. For constipation) 30 capsule 0 10/31/2014 at Unknown time  . magnesium hydroxide (MILK OF MAGNESIA) 400 MG/5ML suspension Take 30 mLs by mouth daily as needed for mild constipation.   Past Month at Unknown time  . metolazone (ZAROXOLYN) 2.5 MG tablet Take 2.5 mg by mouth once a week. Weekly on Wed   Past Week at Unknown time  . ondansetron (ZOFRAN) 8 MG tablet Take 8 mg by mouth every 8 (eight) hours as needed for nausea or vomiting.   Past Month at Unknown time  . pantoprazole (PROTONIX) 40 MG tablet Take 1 tablet (40 mg total) by mouth 2 (two)  times daily.   10/31/2014 at Unknown time  . polyethylene glycol (MIRALAX / GLYCOLAX) packet Take 17 g by mouth daily.   10/31/2014 at Unknown time  . pramoxine (PROCTOFOAM) 1 % foam Place 1 application rectally 2 (two) times daily.   10/31/2014 at Unknown time  . predniSONE (DELTASONE) 10 MG tablet Take 10 mg by mouth daily with breakfast.    10/31/2014 at Unknown time  . sennosides-docusate sodium (SENOKOT-S) 8.6-50 MG tablet Take 1 tablet by mouth daily.   10/31/2014 at Unknown time  . simethicone (MYLICON) 80 MG chewable tablet Chew 80 mg by mouth as needed for flatulence.   unknown  . Sodium Phosphates (RA SALINE ENEMA RE) Place 1 each rectally daily as needed (constipation).   unknown  . traMADol (ULTRAM) 50 MG tablet Take 1 tablet (50 mg total) by mouth every 8 (eight) hours. 20 tablet 5 10/31/2014 at Unknown time  . UNABLE TO FIND Take by mouth 2 (two) times daily. Med Name: Magic Cup   10/31/2014 at Unknown time  . UNABLE TO FIND Take 120 mLs by mouth 2 (two) times daily. Med Name: Med Pass   10/31/2014 at Unknown time  . clopidogrel (PLAVIX) 75 MG tablet Take 1 tablet (75 mg total) by mouth daily. (Patient not taking: Reported on 11/01/2014) 30 tablet 0 Not Taking at Unknown time  . potassium chloride SA (K-DUR,KLOR-CON) 20 MEQ tablet Take 1 tablet (20 mEq total) by mouth daily. (Patient not taking: Reported on 11/01/2014)   Not Taking at Unknown time    Current medications: Current Facility-Administered Medications  Medication Dose Route Frequency Ralene Gasparyan Last Rate Last Dose  . acetaminophen (TYLENOL) tablet 650 mg  650 mg Oral Q6H PRN Ivor Costa, MD   650 mg at 11/04/14 2241   Or  . acetaminophen (TYLENOL) suppository 650 mg  650 mg Rectal Q6H PRN Ivor Costa, MD      . albuterol (PROVENTIL) (2.5 MG/3ML) 0.083% nebulizer solution 2.5 mg  2.5 mg Nebulization Q2H PRN Nishant Dhungel, MD      . antiseptic oral rinse (CPC / CETYLPYRIDINIUM CHLORIDE 0.05%) solution 7 mL  7 mL Mouth Rinse q12n4p  Nishant Dhungel, MD   7 mL at 11/04/14 1600  . atorvastatin (LIPITOR) tablet 40 mg  40 mg Oral q1800 Ivor Costa, MD   40 mg at 11/04/14 1745  . aztreonam (AZACTAM) 1 g in dextrose 5 % 50 mL IVPB  1 g Intravenous 3 times per day Franky Macho, RPH   1 g at 11/05/14 0743  . bisacodyl (DULCOLAX) suppository 10 mg  10 mg Rectal Daily PRN Ivor Costa, MD      . carvedilol (COREG)  tablet 3.125 mg  3.125 mg Oral BID WC Ivor Costa, MD   3.125 mg at 11/04/14 0820  . chlorhexidine (PERIDEX) 0.12 % solution 15 mL  15 mL Mouth Rinse BID Nishant Dhungel, MD   15 mL at 11/04/14 2242  . feeding supplement (PRO-STAT SUGAR FREE 64) liquid 30 mL  30 mL Oral BID Ivor Costa, MD   30 mL at 11/04/14 2240  . ferrous sulfate tablet 325 mg  325 mg Oral Daily Ivor Costa, MD   325 mg at 11/04/14 1031  . hydrocortisone-pramoxine (PROCTOFOAM-HC) rectal foam 1 applicator  1 applicator Rectal BID Ivor Costa, MD   1 applicator at 54/27/06 2376  . lactulose (CHRONULAC) 10 GM/15ML solution 10 g  10 g Oral BID Ivor Costa, MD   10 g at 11/04/14 2240  . lubiprostone (AMITIZA) capsule 24 mcg  24 mcg Oral BID WC Ivor Costa, MD   24 mcg at 11/04/14 1745  . magnesium hydroxide (MILK OF MAGNESIA) suspension 30 mL  30 mL Oral Daily PRN Ivor Costa, MD      . multivitamin with minerals tablet 1 tablet  1 tablet Oral Daily Jenifer A Jimmye Norman, RD   1 tablet at 11/04/14 1402  . ondansetron (ZOFRAN) injection 4 mg  4 mg Intravenous Q8H PRN Ivor Costa, MD      . pantoprazole (PROTONIX) EC tablet 40 mg  40 mg Oral BID Ivor Costa, MD   40 mg at 11/04/14 2242  . polyethylene glycol (MIRALAX / GLYCOLAX) packet 17 g  17 g Oral Daily PRN Ivor Costa, MD      . potassium chloride SA (K-DUR,KLOR-CON) CR tablet 40 mEq  40 mEq Oral Once Nishant Dhungel, MD   40 mEq at 11/05/14 1003  . predniSONE (DELTASONE) tablet 10 mg  10 mg Oral Q breakfast Ivor Costa, MD   10 mg at 11/04/14 0820  . senna-docusate (Senokot-S) tablet 1 tablet  1 tablet Oral Daily Nishant Dhungel, MD   1  tablet at 11/04/14 1031  . simethicone (MYLICON) chewable tablet 80 mg  80 mg Oral QID PRN Ivor Costa, MD          Allergies: Allergies  Allergen Reactions  . Ativan [Lorazepam] Itching, Swelling and Rash    Throat, Tongue swelling  . Lexapro [Escitalopram Oxalate] Swelling  . Latex Hives and Rash  . Levaquin [Levofloxacin] Other (See Comments)    Per MAR  . Penicillins Other (See Comments)    Per MAR  . Tetanus-Diphtheria Toxoids Td Other (See Comments)    Per [facility]      Past Medical History: Past Medical History  Diagnosis Date  . HTN (hypertension)   . Diverticulosis   . Hemorrhoid   . Constipation, chronic   . Systolic and diastolic CHF, chronic     a. Echo 07/2011: Technically limited; inferior HK, inferolateral and possibly lateral wall HK, EF 40%, moderate LVE, aortic sclerosis without stenosis, mild MR, mild LAE, question RV dysfunction;   b.  Echo (04/2013): EF 50-55%, normal wall motion, moderate MR, mild to moderate LAE, PASP 37  . Female bladder prolapse     with prolapse uterus, cystocele, s/p TVH/BSO 12/2006  . Obesity (BMI 30-39.9)     wt. 94.1 kg 12/2010  . Moderate mitral regurgitation     a. Mod to Sev by echo 01/2009;  b. mild MR by echo 2013  . Atrial fibrillation 10/13/2011    coumadin Rx  . Iron deficiency anemia   . Shortness  of breath dyspnea   . GERD (gastroesophageal reflux disease)   . Hyperlipidemia   . Fracture, ankle Dec. 26, 2015    Left  . Fall at home March 2016  . Dysrhythmia   . History of hiatal hernia   . Pneumonia "so many times"  . Recurrent UTI (urinary tract infection)     "she had 3 last month" (11/01/2014)  . DM type 2 (diabetes mellitus, type 2) dx'd 07/25/2014  . History of blood transfusion 2015    "related to LGIB"  . Lower GI bleeding 2015  . Osteoarthritis     "everywhere" (11/01/2014)  . Kidney stones     "has been passing them for the last 5 days" (11/01/2014)  . Chronic kidney disease (CKD), stage II (mild)      Archie Endo 11/01/2014     Past Surgical History: Past Surgical History  Procedure Laterality Date  . Wrist fracture surgery Left 2000's  . Colonoscopy N/A 07/18/2013    Procedure: COLONOSCOPY;  Surgeon: Wonda Horner, MD;  Location: [facility];  Service: Endoscopy;  Laterality: N/A;  . Flexible sigmoidoscopy N/A 03/08/2014    Procedure: FLEXIBLE SIGMOIDOSCOPY;  Surgeon: Lear Ng, MD;  Location: [facility]COPY;  Service: Endoscopy;  Laterality: N/A;  need hoyer lift  . Aortogram Left 09/25/2014    Procedure: AORTOGRAM WITH LEFT LEG LOWER EXTREMIY RUNOFF; ANGIOPLASTY OF TIBIAL AND PERONEAL ARTERY;  Surgeon: Conrad [place], MD;  Location: [facility];  Service: Vascular;  Laterality: Left;  . Vaginal hysterectomy    . Vaginal hysterectomy,cystocele and prolapse  repair    . Fracture surgery       Family History: Family History  Problem Relation Age of Onset  . Diabetes Brother   . Hypertension Mother     deceased @ 70  . Leukemia Brother   . Cancer Brother   . Stroke Mother   . Diabetes Father     deceased in his 87's     Social History: Social History   Social History  . Marital Status: Single    Spouse Name: N/A  . Number of Children: N/A  . Years of Education: N/A   Occupational History  . retired Gaffer    Social History Main Topics  . Smoking status: Never Smoker   . Smokeless tobacco: Former Systems developer    Types: Snuff  . Alcohol Use: Yes     Comment: "drank some years and years ago"  . Drug Use: No  . Sexual Activity: Not Currently   Other Topics Concern  . Not on file   Social History Narrative   Lives in [place] by herself.  Her dtr lives near by and prepares all of her meals and leaves them for the pt to heat up in the microwave.  Dtr pays close attn to salt in meals.  Pt is sedentary.  Most activity is moving from couch to bed.  She ambulates w/ a walker.  Wt is up from 217 in January to 244 now.     Review of Systems: As per HPI  Vital  Signs: Blood pressure 121/80, pulse 89, temperature 97.4 F (36.3 C), temperature source Oral, resp. rate 18, height 5\' 2"  (1.575 m), weight 245 lb 9.5 oz (111.4 kg), SpO2 100 %.  Weight trends: Filed Weights   11/03/14 0636 11/04/14 0526 11/05/14 0436  Weight: 239 lb 6.7 oz (108.6 kg) 237 lb 14 oz (107.9 kg) 245 lb 9.5 oz (111.4 kg)    Physical Exam:  General: alert, sitting up in bed, NAD HEENT: [place]/AT, moon facies, hard of hearing, mucus membranes moist CV: RRR, no m/g/r Pulm: coarse breath sounds bilaterally, on 2 L oxygen via [place] Abd: BS+, soft, non-tender Ext: 2+ peripheral edema in lower extremities and upper extremities  Neuro: alert, chronic left sided weakness  Lab results: Basic Metabolic Panel:  Recent Labs Lab 11/03/14 0900 11/04/14 0341 11/05/14 0535  NA 125* 126* 125*  K 4.4 3.8 3.3*  CL 92* 95* 95*  CO2 24 21* 21*  GLUCOSE 77 69 89  BUN 29* 26* 34*  CREATININE 1.18* 0.99 0.97  CALCIUM 8.3* 8.4* 8.0*    Liver Function Tests:  Recent Labs Lab 11/01/14 0323  AST 17  ALT 19  ALKPHOS 123  BILITOT 0.5  PROT 5.5*  ALBUMIN 2.8*    Recent Labs Lab 11/01/14 0323  LIPASE 16*    Recent Labs Lab 11/01/14 0532  AMMONIA 26    CBC:  Recent Labs Lab 11/01/14 0323  11/01/14 0656 11/02/14 0559 11/03/14 0900 11/04/14 0341 11/05/14 0535  WBC 9.9  --  9.2 9.3 8.8 9.0 8.0  NEUTROABS 8.0*  --   --   --   --   --   --   HGB 9.7*  < > 9.1* 9.0* 9.2* 9.1* 9.3*  HCT 30.8*  < > 28.6* 28.3* 29.2* 29.2* 29.2*  MCV 102.3*  --  103.2* 103.7* 102.1* 101.7* 103.2*  PLT 316  --  283 279 297 308 265  < > = values in this interval not displayed.  Cardiac Enzymes:  Recent Labs Lab 11/01/14 0656 11/01/14 1340 11/01/14 1934  TROPONINI <0.03 0.05* <0.03    BNP: Invalid input(s): POCBNP  CBG:  Recent Labs Lab 11/01/14 0955 11/02/14 0843 11/03/14 0731 11/04/14 0742 11/05/14 0745  GLUCAP 77 84 74 75 88    Microbiology: Results for orders  placed or performed during the hospital encounter of 11/01/14  Urine culture     Status: None   Collection Time: 11/01/14  4:17 AM  Result Value Ref Range Status   Specimen Description URINE, CATHETERIZED  Final   Special Requests ADDED [id number] [phone]  Final   Culture 1,000 COLONIES/mL INSIGNIFICANT GROWTH  Final   Report Status 11/02/2014 FINAL  Final  Blood culture (routine x 2)     Status: None   Collection Time: 11/01/14  5:35 AM  Result Value Ref Range Status   Specimen Description BLOOD RIGHT ANTECUBITAL  Final   Special Requests BOTTLES DRAWN AEROBIC AND ANAEROBIC 10CC  Final   Culture  Setup Time   Final    GRAM POSITIVE COCCI IN CLUSTERS IN BOTH AEROBIC AND ANAEROBIC BOTTLES CRITICAL RESULT CALLED TO, READ BACK BY AND VERIFIED WITH: EUNICE @0109  11/02/14 MKELLY GRAM POSITIVE RODS AEROBIC BOTTLE ONLY    Culture   Final    STAPHYLOCOCCUS SPECIES (COAGULASE NEGATIVE) THE SIGNIFICANCE OF ISOLATING THIS ORGANISM FROM A SINGLE SET OF BLOOD CULTURES WHEN MULTIPLE SETS ARE DRAWN IS UNCERTAIN. PLEASE NOTIFY THE MICROBIOLOGY DEPARTMENT WITHIN ONE WEEK IF SPECIATION AND SENSITIVITIES ARE REQUIRED. DIPHTHEROIDS(CORYNEBACTERIUM SPECIES) Standardized susceptibility testing for this organism is not available.    Report Status 11/04/2014 FINAL  Final  Blood culture (routine x 2)     Status: None (Preliminary result)   Collection Time: 11/01/14  5:40 AM  Result Value Ref Range Status   Specimen Description BLOOD HAND RIGHT  Final   Special Requests BOTTLES DRAWN AEROBIC AND ANAEROBIC 5CC  Final   Culture  NO GROWTH 3 DAYS  Final   Report Status PENDING  Incomplete  MRSA PCR Screening     Status: None   Collection Time: 11/01/14  9:36 AM  Result Value Ref Range Status   MRSA by PCR NEGATIVE NEGATIVE Final    Comment:        The GeneXpert MRSA Assay (FDA approved for NASAL specimens only), is one component of a comprehensive MRSA colonization surveillance program. It is not intended to  diagnose MRSA infection nor to guide or monitor treatment for MRSA infections.     Coagulation Studies: No results for input(s): LABPROT, INR in the last 72 hours.  Urinalysis: No results for input(s): COLORURINE, LABSPEC, PHURINE, GLUCOSEU, HGBUR, BILIRUBINUR, KETONESUR, PROTEINUR, UROBILINOGEN, NITRITE, LEUKOCYTESUR in the last 72 hours.  Invalid input(s): APPERANCEUR    Imaging:  No results found.   Assessment & Plan:  1. Hyponatremia: Na 126 on admission, previously normal at 139. Has remained in 124-126 range. Patient is significantly volume overloaded on exam. Weight up 11 lbs from admission. Her urine sodium is low (appropriate ADH suppression). Her hyponatremia is likely secondary to volume overload possibly due to her heart failure. Her plasma osmolality is on low side indicating more of a true volume depletion etiology. Will start Lasix 40 mg IV Q6H and re-evaluate sodium in AM.   2. CKD Stage II: Mild AKI on admission with Cr up to 1.2, but now currently at baseline 0.9. Continue to monitor. May rise slightly with diuresis.   3. Pyelonephritis/Possible HCAP: Currently on Vancomycin and Aztreonam. Treatment per primary team.   4. Hypokalemia: K 3.3 today. Getting K-Dur 40 mEq x 1.   5. Metabolic Acidosis: Bicarb 26 on admission, currently 21. Will continue to monitor.   6. Iron Deficiency Anemia: Hgb 9.7 on admission, currently 9.3. She is on iron supplementation at home and this is being continued in hospital. Continue to monitor.    DVT PPX - SCDs]  Thank you for this interesting consult. Attending note to follow.   Albin Felling, MD, MPH Internal Medicine Resident, PGY-II Pager: [phone]

## 2014-11-05 NOTE — Care Management Note (Addendum)
Case Management Note  Patient Details  Name: [name] MRN: [id number] Date of Birth: [birth year]  Subjective/Objective:                  Date: 9-27 Tuesday Spoke with patient at the bedside along with daughter, [name], [phone]. Introduced self as Tourist information centre manager and explained role in discharge planning and how to be reached. Verified patient lives in [place] in apartment [address] [place] [address] phone is off, [name] (son) is contact for the house [phone] patient would have full time supervision by daughter and nieces at this time to best of their knowledge. Patient has DME Ramp to get into home and a hospital bed. Expressed potential need for wheelchair, 3in1, home oxygen, suction, hoyer lift. Explained to daughter that patient can receive [place] RN or Hospice RN, visiting a few times a week, and not providing extended care. Patient's daughter verbalized understanding that patient would require 24/7 supervision with assistance through a private duty health care worker with their responsibility to pay. Explained to daughter to apply to CAPS through PCP to be added to wait list. Daughter verbalized understanding that patient will not have CAPS approved and in place prior to discharge and family will be responsible for providing 24/7 care, and that this may be up to 2 months or more. Patient denied needing help with their medication. Patient will need ambulance transport to home. Verified patient has PCP. Patient states they currently receive [place] services through no one, patient is from a SNF. Patient was provided choice and selected [facility] for home health needs and DME if they arise.  Plan: CM will continue to follow for discharge planning and [facility] resources.   Samantha Collet RN BSN CM [phone]   Action/Plan:  Patient will need HH RN SW [facility] PT and DME if discharged to home. Orders placed for [facility] DME, referral made to [facility], order requested from MD for home O2.    Expected Discharge Date:                  Expected Discharge Plan:  [place]  In-House Referral:     Discharge planning Services  CM Consult  Post Acute Care Choice:    Choice offered to:     DME Arranged:    DME Agency:     HH Arranged:    [place] Agency:     Status of Service:  In process, will continue to follow  Medicare Important Message Given:  Yes-second notification given Date Medicare IM Given:    Medicare IM give by:    Date Additional Medicare IM Given:    Additional Medicare Important Message give by:     If discussed at [place] of Stay Meetings, dates discussed:    Additional Comments:  Samantha Collet, RN 11/05/2014, 11:11 AM

## 2014-11-05 NOTE — Progress Notes (Addendum)
[facility] PROGRESS NOTE  [name] HQI:[id number] DOB: [birth year] DOA: 11/01/2014 PCP: Osborne Casco, MD  Brief narrative 80 year old female with history of PVD status post recent  PTA L SFA and peroneal artery by Dr. Bridgett Larsson 09/25/14), hypertension, diet-controlled diabetes mellitus, severe hearing loss, GERD, diastolic congestive heart failure, bladder prolapse, mitral valve regurgitation, atrial fibrillation (not on anticoagulant due to GI bleeding), iron deficiency anemia, GI bleeding, chronic respiratory failure on 2 L oxygen at home, CKD-II, recurrent UTI, morbid obesity, who is mostly bedbound since December 2015 (after she had left ankle fracture and possibly peripheral vascular disease) was brought to the ED by her daughter with right flank pain, dysuria, increased urinary frequency and altered mental status. Patient also complained of some chest discomfort. She was somnolent on presentation, had stable vitals and workup showed UA suggestive of UTI, hyponatremia with sodium 126, possible focal infiltration in the left mid lung on chest x-ray and drop in hemoglobin to 9.7 (from baseline of 12.5) Patient admitted for further management.  Assessment/Plan: Acute pyelonephritis  empiric aztreonam. Left nonobstructive nephrolithiasis on CT scan from 9/20. No hydronephrosis noted. Urine culture with insignificant growth.  Acute encephalopathy Secondary to pyelonephritis and possible pneumonia. Now resolved. Avoid narcotics or benzos as much as possible.   Possible healthcare associated pneumonia On empiric vancomycin and aztreonam. Blood culture growing coag negative staph which appears to be a contaminant. Discontinue vancomycin given supratherapeutic level.  Hyponatremia  secondary to dehydration vs  SIADH with pneumonia. .FeNa 0.1.  Monitor urine output closely.  urine Na<10   and low serum osm. Maintain fluid restriction with by mouth intake. Consulted renal given  persistent hyponatremia.  Iron deficiency anemia with drop in H&H Hemoglobin dropped to 9 from baseline of 12.5. (Has been stable since admission) . Has history of GI bleed. Stool for occult blood negative.. Monitor H&H closely. Continue PPI twice a day and iron supplement. Followed by [facility] as outpatient.  Coagulase-negative +ve blood cx In 1/2 bottles. Which is possibly a contaminant. Repeat blood culture negative   Chest pain on admission Denies any symptoms at this time. EKG unremarkable. Troponin peaked at 0.05. Repeat echo with EF of 55-6 depression and no wall motion abnormality.  Peripheral vascular disease Status post PTA L SFA and peroneal artery by Dr. Bridgett Larsson 09/25/14. Continue Plavix and statin. Has a clean superficial ulcer over the left shin. Follows with wound care clinic in [place]. Appreciate wound care evaluation while inpatient.  Diastolic CHF Appears euvolemic. Discontinuing metolazone for now given hyponatremia. Continue gentle hydration.  A. fib Not on anticoagulation due to GI bleed. Continue metoprolol.  Hypokalemia Replenish   Chronic respiratory failure Continue O2 via nasal cannula (on 2 L O2 at home)  Morbid obesity  Chronic kidney disease stage II Renal function at baseline   Constipation with high stool burden Continue lactulose and MiraLAX. Improved.  Chronic arthritis On chronic low-dose prednisone as outpatient.   DVT prophylaxis: Subcutaneous heparin  Diet: dys 1     Code Status: Full Code, palliative care consulted given multiple co morbidities and FTT Family Communication: daughter at bedside Disposition Plan: Return to SNF once Na improved and Palliative care discussion for [place]. Possibly in 48 hrs   Consultants:  None  Procedures:  CT abdomen (renal stone study)  Antibiotics:  IV vancomycin and aztreonam  HPI/Subjective: Admission H&P reviewed. Patient is alert and answering questions. (Very hard of  hearing)  Objective: Filed Vitals:   11/05/14 1320  BP: 108/63  Pulse: 82  Temp: 98.8 F (37.1 C)  Resp: 18    Intake/Output Summary (Last 24 hours) at 11/05/14 1324 Last data filed at 11/05/14 1320  Gross per 24 hour  Intake    390 ml  Output    550 ml  Net   -160 ml   Filed Weights   11/03/14 0636 11/04/14 0526 11/05/14 0436  Weight: 108.6 kg (239 lb 6.7 oz) 107.9 kg (237 lb 14 oz) 111.4 kg (245 lb 9.5 oz)    Exam:   General:  Elderly obese female, extremely hard of hearing  HEENT:  moist oral mucosa, supple neck  Chest: Diminished breath sounds bilaterally due to body habitus  CVS: Normal S1 and S2, no murmurs   GI Soft, nondistended, nontender, bowel sounds present  Musculoskeletal: Warm, no edema, ulcer left tibia (chronic)  CNS: Alert and awake, oriented at baseline , very hard of hearing, chronic left-sided weakness  Data Reviewed: Basic Metabolic Panel:  Recent Labs Lab 11/01/14 0327 11/02/14 0559 11/03/14 0900 11/04/14 0341 11/05/14 0535  NA 126* 124* 125* 126* 125*  K 4.1 4.2 4.4 3.8 3.3*  CL 92* 90* 92* 95* 95*  CO2  --  25 24 21* 21*  GLUCOSE 85 88 77 69 89  BUN 26* 25* 29* 26* 34*  CREATININE 1.10* 1.24* 1.18* 0.99 0.97  CALCIUM  --  8.1* 8.3* 8.4* 8.0*   Liver Function Tests:  Recent Labs Lab 11/01/14 0323  AST 17  ALT 19  ALKPHOS 123  BILITOT 0.5  PROT 5.5*  ALBUMIN 2.8*    Recent Labs Lab 11/01/14 0323  LIPASE 16*    Recent Labs Lab 11/01/14 0532  AMMONIA 26   CBC:  Recent Labs Lab 11/01/14 0323  11/01/14 0656 11/02/14 0559 11/03/14 0900 11/04/14 0341 11/05/14 0535  WBC 9.9  --  9.2 9.3 8.8 9.0 8.0  NEUTROABS 8.0*  --   --   --   --   --   --   HGB 9.7*  < > 9.1* 9.0* 9.2* 9.1* 9.3*  HCT 30.8*  < > 28.6* 28.3* 29.2* 29.2* 29.2*  MCV 102.3*  --  103.2* 103.7* 102.1* 101.7* 103.2*  PLT 316  --  283 279 297 308 265  < > = values in this interval not displayed. Cardiac Enzymes:  Recent Labs Lab  11/01/14 0656 11/01/14 1340 11/01/14 1934  TROPONINI <0.03 0.05* <0.03   BNP (last 3 results)  Recent Labs  09/20/14 0838 11/02/14 0559  BNP 332.2* 374.7*    ProBNP (last 3 results)  Recent Labs  11/28/13 1110  PROBNP 2108.0*    CBG:  Recent Labs Lab 11/01/14 0955 11/02/14 0843 11/03/14 0731 11/04/14 0742 11/05/14 0745  GLUCAP 77 84 74 75 88    Recent Results (from the past 240 hour(s))  Urine culture     Status: None   Collection Time: 11/01/14  4:17 AM  Result Value Ref Range Status   Specimen Description URINE, CATHETERIZED  Final   Special Requests ADDED [id number] [phone]  Final   Culture 1,000 COLONIES/mL INSIGNIFICANT GROWTH  Final   Report Status 11/02/2014 FINAL  Final  Blood culture (routine x 2)     Status: None   Collection Time: 11/01/14  5:35 AM  Result Value Ref Range Status   Specimen Description BLOOD RIGHT ANTECUBITAL  Final   Special Requests BOTTLES DRAWN AEROBIC AND ANAEROBIC 10CC  Final   Culture  Setup Time   Final  GRAM POSITIVE COCCI IN CLUSTERS IN BOTH AEROBIC AND ANAEROBIC BOTTLES CRITICAL RESULT CALLED TO, READ BACK BY AND VERIFIED WITH: EUNICE @0109  11/02/14 MKELLY GRAM POSITIVE RODS AEROBIC BOTTLE ONLY    Culture   Final    STAPHYLOCOCCUS SPECIES (COAGULASE NEGATIVE) THE SIGNIFICANCE OF ISOLATING THIS ORGANISM FROM A SINGLE SET OF BLOOD CULTURES WHEN MULTIPLE SETS ARE DRAWN IS UNCERTAIN. PLEASE NOTIFY THE MICROBIOLOGY DEPARTMENT WITHIN ONE WEEK IF SPECIATION AND SENSITIVITIES ARE REQUIRED. DIPHTHEROIDS(CORYNEBACTERIUM SPECIES) Standardized susceptibility testing for this organism is not available.    Report Status 11/04/2014 FINAL  Final  Blood culture (routine x 2)     Status: None (Preliminary result)   Collection Time: 11/01/14  5:40 AM  Result Value Ref Range Status   Specimen Description BLOOD HAND RIGHT  Final   Special Requests BOTTLES DRAWN AEROBIC AND ANAEROBIC 5CC  Final   Culture NO GROWTH 3 DAYS  Final   Report  Status PENDING  Incomplete  MRSA PCR Screening     Status: None   Collection Time: 11/01/14  9:36 AM  Result Value Ref Range Status   MRSA by PCR NEGATIVE NEGATIVE Final    Comment:        The GeneXpert MRSA Assay (FDA approved for NASAL specimens only), is one component of a comprehensive MRSA colonization surveillance program. It is not intended to diagnose MRSA infection nor to guide or monitor treatment for MRSA infections.      Studies: No results found.  Scheduled Meds: . antiseptic oral rinse  7 mL Mouth Rinse q12n4p  . atorvastatin  40 mg Oral q1800  . aztreonam  1 g Intravenous 3 times per day  . carvedilol  3.125 mg Oral BID WC  . chlorhexidine  15 mL Mouth Rinse BID  . feeding supplement (PRO-STAT SUGAR FREE 64)  30 mL Oral BID  . ferrous sulfate  325 mg Oral Daily  . furosemide  40 mg Intravenous Q6H  . hydrocortisone-pramoxine  1 applicator Rectal BID  . lactulose  10 g Oral BID  . lubiprostone  24 mcg Oral BID WC  . multivitamin with minerals  1 tablet Oral Daily  . pantoprazole  40 mg Oral BID  . potassium chloride  40 mEq Oral Once  . predniSONE  10 mg Oral Q breakfast  . senna-docusate  1 tablet Oral Daily   Continuous Infusions:      Time spent: 25 minutes    DHUNGEL, NISHANT  [facility] Pager [phone] If 7PM-7AM, please contact night-coverage at [email or web address], password [facility] 11/05/2014, 1:24 PM  LOS: 4 days

## 2014-11-06 ENCOUNTER — Inpatient Hospital Stay (HOSPITAL_COMMUNITY): Payer: Medicare Other

## 2014-11-06 DIAGNOSIS — N12 Tubulo-interstitial nephritis, not specified as acute or chronic: Secondary | ICD-10-CM

## 2014-11-06 LAB — RENAL FUNCTION PANEL
ANION GAP: 14 (ref 5–15)
Albumin: 2.6 g/dL — ABNORMAL LOW (ref 3.5–5.0)
BUN: 35 mg/dL — ABNORMAL HIGH (ref 6–20)
CALCIUM: 8.2 mg/dL — AB (ref 8.9–10.3)
CO2: 20 mmol/L — AB (ref 22–32)
CREATININE: 0.85 mg/dL (ref 0.44–1.00)
Chloride: 94 mmol/L — ABNORMAL LOW (ref 101–111)
Glucose, Bld: 80 mg/dL (ref 65–99)
PHOSPHORUS: 3.6 mg/dL (ref 2.5–4.6)
Potassium: 3.5 mmol/L (ref 3.5–5.1)
SODIUM: 128 mmol/L — AB (ref 135–145)

## 2014-11-06 LAB — CULTURE, BLOOD (ROUTINE X 2): Culture: NO GROWTH

## 2014-11-06 LAB — BASIC METABOLIC PANEL
Anion gap: 10 (ref 5–15)
BUN: 35 mg/dL — AB (ref 6–20)
CHLORIDE: 95 mmol/L — AB (ref 101–111)
CO2: 23 mmol/L (ref 22–32)
CREATININE: 0.92 mg/dL (ref 0.44–1.00)
Calcium: 8 mg/dL — ABNORMAL LOW (ref 8.9–10.3)
GFR calc Af Amer: >60 mL/min (ref >60)
GFR, EST NON AFRICAN AMERICAN: 58 mL/min — AB (ref >60)
GLUCOSE: 76 mg/dL (ref 65–99)
Potassium: 2.8 mmol/L — ABNORMAL LOW (ref 3.5–5.1)
SODIUM: 128 mmol/L — AB (ref 135–145)

## 2014-11-06 LAB — BLOOD GAS, ARTERIAL
Acid-base deficit: 1.7 mmol/L (ref 0.0–2.0)
BICARBONATE: 22.2 meq/L (ref 20.0–24.0)
DRAWN BY: 319961
FIO2: 28
O2 Saturation: 99.6 %
PH ART: 7.412 (ref 7.350–7.450)
Patient temperature: 98.6
TCO2: 23.3 mmol/L (ref 0–100)
pCO2 arterial: 35.5 mmHg (ref 35.0–45.0)
pO2, Arterial: 139 mmHg — ABNORMAL HIGH (ref 80.0–100.0)

## 2014-11-06 LAB — GLUCOSE, CAPILLARY: GLUCOSE-CAPILLARY: 82 mg/dL (ref 65–99)

## 2014-11-06 MED ORDER — SORBITOL 70 % SOLN
960.0000 mL | TOPICAL_OIL | Freq: Once | ORAL | Status: AC
  Administered 2014-11-06: 960 mL via RECTAL
  Filled 2014-11-06: qty 240

## 2014-11-06 MED ORDER — POTASSIUM CHLORIDE 10 MEQ/100ML IV SOLN
10.0000 meq | INTRAVENOUS | Status: AC
  Administered 2014-11-06 (×5): 10 meq via INTRAVENOUS
  Filled 2014-11-06 (×9): qty 100

## 2014-11-06 MED ORDER — HYDROCORTISONE 1 % EX CREA
TOPICAL_CREAM | Freq: Two times a day (BID) | CUTANEOUS | Status: AC
  Administered 2014-11-06 (×2): via TOPICAL
  Filled 2014-11-06 (×2): qty 28

## 2014-11-06 MED ORDER — DEXTROSE 50 % IV SOLN
25.0000 mL | Freq: Once | INTRAVENOUS | Status: AC
  Administered 2014-11-06: 25 mL via INTRAVENOUS
  Filled 2014-11-06: qty 50

## 2014-11-06 NOTE — Progress Notes (Signed)
Pt lethargic this am. Per daughter, its worse than normal. Paged Dr Landis Gandy. Waiting to hear back from dr. Karen Kays to give pt po meds this am. VSS. Will continue to frequently monitor pt. Bed remains in lowest position and call bell is within reach. Daughter is at bedside.

## 2014-11-06 NOTE — Progress Notes (Signed)
[facility] PROGRESS NOTE  [name] FVC:[id number] DOB: [birth year] DOA: 11/01/2014 PCP: Osborne Casco, MD  Brief narrative 80 year old female with history of PVD status post recent  PTA L SFA and peroneal artery by Dr. Bridgett Larsson 09/25/14), hypertension, diet-controlled diabetes mellitus, severe hearing loss, GERD, diastolic congestive heart failure, bladder prolapse, mitral valve regurgitation, atrial fibrillation (not on anticoagulant due to GI bleeding), iron deficiency anemia, GI bleeding, chronic respiratory failure on 2 L oxygen at home, CKD-II, recurrent UTI, morbid obesity, who is mostly bedbound since December 2015 (after she had left ankle fracture and possibly peripheral vascular disease) was brought to the ED by her daughter with right flank pain, dysuria, increased urinary frequency and altered mental status. Patient also complained of some chest discomfort. She was somnolent on presentation, had stable vitals and workup showed UA suggestive of UTI, hyponatremia with sodium 126, possible focal infiltration in the left mid lung on chest x-ray and drop in hemoglobin to 9.7 (from baseline of 12.5) Patient admitted for further management.  Assessment/Plan: Acute pyelonephritis  - on empiric aztreonam. Left nonobstructive nephrolithiasis on CT scan from 9/20. No hydronephrosis noted. Urine culture with insignificant growth.  Acute encephalopathy Secondary to pyelonephritis and possible pneumonia.  Avoid narcotics or benzos as much as possible.  - Patient with worsening mental status today, ABG with no CO2 retention, CT head with bilateral middle ear mastoid fluid not present previously,   Possible healthcare associated pneumonia On empiric vancomycin and aztreonam. Blood culture growing coag negative staph which appears to be a contaminant. Discontinue vancomycin given supratherapeutic level.  Hyponatremia  secondary to hypervolemia .nephrology consult appreciated ,  improving with IV diuresis   Iron deficiency anemia with drop in H&H Hemoglobin dropped to 9 from baseline of 12.5. (Has been stable since admission) . Has history of GI bleed. Stool for occult blood negative.. Monitor H&H closely. Continue PPI twice a day and iron supplement. Followed by [facility] as outpatient.  Coagulase-negative +ve blood cx In 1/2 bottles. Which is possibly a contaminant. Repeat blood culture negative   Chest pain on admission Denies any symptoms at this time. EKG unremarkable. Troponin peaked at 0.05. Repeat echo with EF of 55-6 depression and no wall motion abnormality.  Peripheral vascular disease Status post PTA L SFA and peroneal artery by Dr. Bridgett Larsson 09/25/14. Continue Plavix and statin. Has a clean superficial ulcer over the left shin. Follows with wound care clinic in [place]. Appreciate wound care evaluation while inpatient.  Diastolic CHF Appears euvolemic. Discontinuing metolazone for now given hyponatremia. Continue gentle hydration.  A. fib Not on anticoagulation due to GI bleed. Continue metoprolol.  Hypokalemia - Worsening secondary to IV diuresis,  continue to replete , monitor closely    Chronic respiratory failure Continue O2 via nasal cannula (on 2 L O2 at home)  Morbid obesity  Chronic kidney disease stage II Renal function at baseline   Constipation with high stool burden Continue lactulose and MiraLWill give smog enema  Chronic onic arthritis On chronic low-dose prednisone as outpatient., Continue Tylenol as needed    DVT prophylaxis: Subcutaneous heparin  Diet: dys 1     Code Status: Full Code, palliative care consulted given multiple co morbidities and FTT Family Communication: daughter at bedside Disposition Plan: Return to SNF once stable  Consults renal  Procedures ocedures:  CT abdomen (renal stone study)  Antibiotics:  IV vancomycin and aztreonam  HPI/Subjective:  patient is more lethargic this a.m.    Objective: Filed Vitals:   11/06/14  1401  BP: 110/58  Pulse: 82  Temp: 97.9 F (36.6 C)  Resp: 20    Intake/Output Summary (Last 24 hours) at 11/06/14 1458 Last data filed at 11/06/14 1300  Gross per 24 hour  Intake    200 ml  Output   2500 ml  Net  -2300 ml   Filed Weights   11/03/14 0636 11/04/14 0526 11/05/14 0436  Weight: 108.6 kg (239 lb 6.7 oz) 107.9 kg (237 lb 14 oz) 111.4 kg (245 lb 9.5 oz)    Exam:   General:  Elderly obese female, extremely hard of hearing, responsive to very loud verbal stimuli   HEENT:  moist oral mucosa, supple neck  Chest: Diminished breath sounds bilaterally due to body habitus  CVS: Normal S1 and S2, no murmurs   GI Soft, nondistended, nontender, bowel sounds present  Musculoskeletal: Warm, +1 edema, ulcer left tibia (chronic)  CNS: Alert and awake, oriented at baseline , very hard of hearing, chronic left-sided weakness  Data Reviewed: Basic Metabolic Panel:  Recent Labs Lab 11/02/14 0559 11/03/14 0900 11/04/14 0341 11/05/14 0535 11/06/14 0728  NA 124* 125* 126* 125* 128*  K 4.2 4.4 3.8 3.3* 2.8*  CL 90* 92* 95* 95* 95*  CO2 25 24 21* 21* 23  GLUCOSE 88 77 69 89 76  BUN 25* 29* 26* 34* 35*  CREATININE 1.24* 1.18* 0.99 0.97 0.92  CALCIUM 8.1* 8.3* 8.4* 8.0* 8.0*   Liver Function Tests:  Recent Labs Lab 11/01/14 0323  AST 17  ALT 19  ALKPHOS 123  BILITOT 0.5  PROT 5.5*  ALBUMIN 2.8*    Recent Labs Lab 11/01/14 0323  LIPASE 16*    Recent Labs Lab 11/01/14 0532  AMMONIA 26   CBC:  Recent Labs Lab 11/01/14 0323  11/01/14 0656 11/02/14 0559 11/03/14 0900 11/04/14 0341 11/05/14 0535  WBC 9.9  --  9.2 9.3 8.8 9.0 8.0  NEUTROABS 8.0*  --   --   --   --   --   --   HGB 9.7*  < > 9.1* 9.0* 9.2* 9.1* 9.3*  HCT 30.8*  < > 28.6* 28.3* 29.2* 29.2* 29.2*  MCV 102.3*  --  103.2* 103.7* 102.1* 101.7* 103.2*  PLT 316  --  283 279 297 308 265  < > = values in this interval not displayed. Cardiac  Enzymes:  Recent Labs Lab 11/01/14 0656 11/01/14 1340 11/01/14 1934  TROPONINI <0.03 0.05* <0.03   BNP (last 3 results)  Recent Labs  09/20/14 0838 11/02/14 0559  BNP 332.2* 374.7*    ProBNP (last 3 results)  Recent Labs  11/28/13 1110  PROBNP 2108.0*    CBG:  Recent Labs Lab 11/02/14 0843 11/03/14 0731 11/04/14 0742 11/05/14 0745 11/06/14 0759  GLUCAP 84 74 75 88 82    Recent Results (from the past 240 hour(s))  Urine culture     Status: None   Collection Time: 11/01/14  4:17 AM  Result Value Ref Range Status   Specimen Description URINE, CATHETERIZED  Final   Special Requests ADDED [id number] [phone]  Final   Culture 1,000 COLONIES/mL INSIGNIFICANT GROWTH  Final   Report Status 11/02/2014 FINAL  Final  Blood culture (routine x 2)     Status: None   Collection Time: 11/01/14  5:35 AM  Result Value Ref Range Status   Specimen Description BLOOD RIGHT ANTECUBITAL  Final   Special Requests BOTTLES DRAWN AEROBIC AND ANAEROBIC 10CC  Final   Culture  Setup Time  Final    GRAM POSITIVE COCCI IN CLUSTERS IN BOTH AEROBIC AND ANAEROBIC BOTTLES CRITICAL RESULT CALLED TO, READ BACK BY AND VERIFIED WITH: EUNICE @0109  11/02/14 MKELLY GRAM POSITIVE RODS AEROBIC BOTTLE ONLY    Culture   Final    STAPHYLOCOCCUS SPECIES (COAGULASE NEGATIVE) THE SIGNIFICANCE OF ISOLATING THIS ORGANISM FROM A SINGLE SET OF BLOOD CULTURES WHEN MULTIPLE SETS ARE DRAWN IS UNCERTAIN. PLEASE NOTIFY THE MICROBIOLOGY DEPARTMENT WITHIN ONE WEEK IF SPECIATION AND SENSITIVITIES ARE REQUIRED. DIPHTHEROIDS(CORYNEBACTERIUM SPECIES) Standardized susceptibility testing for this organism is not available.    Report Status 11/04/2014 FINAL  Final  Blood culture (routine x 2)     Status: None   Collection Time: 11/01/14  5:40 AM  Result Value Ref Range Status   Specimen Description BLOOD HAND RIGHT  Final   Special Requests BOTTLES DRAWN AEROBIC AND ANAEROBIC 5CC  Final   Culture NO GROWTH 5 DAYS  Final    Report Status 11/06/2014 FINAL  Final  MRSA PCR Screening     Status: None   Collection Time: 11/01/14  9:36 AM  Result Value Ref Range Status   MRSA by PCR NEGATIVE NEGATIVE Final    Comment:        The GeneXpert MRSA Assay (FDA approved for NASAL specimens only), is one component of a comprehensive MRSA colonization surveillance program. It is not intended to diagnose MRSA infection nor to guide or monitor treatment for MRSA infections.      Studies: Ct Head Wo Contrast  11/06/2014   CLINICAL DATA:  Lethargy today, more than usual per daughter.  EXAM: CT HEAD WITHOUT CONTRAST  TECHNIQUE: Contiguous axial images were obtained from the base of the skull through the vertex without intravenous contrast.  COMPARISON:  06/20/2013.  FINDINGS: Generalized atrophy.  Chronic microvascular ischemic change.  No evidence for acute infarction, hemorrhage, mass lesion, hydrocephalus, or extra-axial fluid.  Calvarium. No sinus air-fluid level. Maxillary and sphenoid sinus retention cysts. Dense lenticular opacities. BILATERAL middle ear and mastoid fluid, not present previously.  IMPRESSION: Atrophy and small vessel disease. No acute intracranial findings are evident.  BILATERAL middle ear mastoid fluid not present previously. Correlate clinically for otitis or mastoiditis.   Electronically Signed   By: Staci Righter M.D.   On: 11/06/2014 09:27    Scheduled Meds: . antiseptic oral rinse  7 mL Mouth Rinse q12n4p  . atorvastatin  40 mg Oral q1800  . aztreonam  1 g Intravenous 3 times per day  . carvedilol  3.125 mg Oral BID WC  . chlorhexidine  15 mL Mouth Rinse BID  . feeding supplement (PRO-STAT SUGAR FREE 64)  30 mL Oral BID  . ferrous sulfate  325 mg Oral Daily  . furosemide  40 mg Intravenous Q6H  . hydrocortisone cream   Topical BID  . hydrocortisone-pramoxine  1 applicator Rectal BID  . lactulose  10 g Oral BID  . lubiprostone  24 mcg Oral BID WC  . multivitamin with minerals  1 tablet  Oral Daily  . pantoprazole  40 mg Oral BID  . potassium chloride  10 mEq Intravenous Q1 Hr x 5  . potassium chloride  40 mEq Oral Once  . predniSONE  10 mg Oral Q breakfast  . senna-docusate  1 tablet Oral Daily  . sorbitol, milk of mag, mineral oil, glycerin (SMOG) enema  960 mL Rectal Once   Continuous Infusions:      Time spent: 35 minutes    ELGERGAWY, DAWOOD  [facility]  Pager [phone] If 7PM-7AM, please contact night-coverage at [email or web address], password [facility] 11/06/2014, 2:58 PM  LOS: 5 days

## 2014-11-06 NOTE — Progress Notes (Signed)
[facility] Progress Note    Assessment/ Plan:   1. Hypervolemic Hyponatremia: Na improved from 125 to 128 this morning with diuresis. Will continue Lasix 40 mg IV Q6H and follow up Na in AM.  2. CKD Stage II: Cr stable at baseline, 0.92.  3. Pyelonephritis/Possible HCAP: Currently on Aztreonam. Treatment per primary team.  4. Hypokalemia: K 2.8 today, down from 3.3. She was supposed to receive K-Dur 40 mEq x 1 yesterday but apparently family refused. Will give KCl 10 mEq x 5 runs and recheck K later this evening.  5. Metabolic Acidosis: Bicarb improved from 21 to 23 after diuresis. Resolved.  6. Iron Deficiency Anemia: Hgb 9.7 on admission, currently 9.3. She is on iron supplementation at home and this is being continued in hospital. Continue to monitor.   Subjective:   Patient less alert this morning. She opened her eyes for me when I called her name, but overall seems altered. Patient went down for CT head and it did not show any acute abnormalities. Na improving after diuresis.    Objective:   BP 112/70 mmHg  Pulse 78  Temp(Src) 97.8 F (36.6 C) (Oral)  Resp 16  Ht 5\' 2"  (1.575 m)  Wt 245 lb 9.5 oz (111.4 kg)  BMI 44.91 kg/m2  SpO2 100%  Intake/Output Summary (Last 24 hours) at 11/06/14 0738 Last data filed at 11/06/14 0700  Gross per 24 hour  Intake    200 ml  Output   1950 ml  Net  -1750 ml   Weight change:   Physical Exam: Gen: less alert this morning, opens eyes to voice, not following commands CVS: RRR, no m/g/r Resp: coarse breath sounds bilaterally, on 2 L oxygen via [place] Abd: BS+, soft, non-tender Ext: 1-2+ peripheral edema in lower extremities   Imaging: No results found.  Labs: BMET  Recent Labs Lab 11/01/14 0327 11/02/14 0559 11/03/14 0900 11/04/14 0341 11/05/14 0535  NA 126* 124* 125* 126* 125*  K 4.1 4.2 4.4 3.8 3.3*  CL 92* 90* 92* 95* 95*  CO2  --  25 24 21* 21*  GLUCOSE 85 88 77 69 89  BUN 26* 25* 29* 26* 34*  CREATININE  1.10* 1.24* 1.18* 0.99 0.97  CALCIUM  --  8.1* 8.3* 8.4* 8.0*   CBC  Recent Labs Lab 11/01/14 0323  11/02/14 0559 11/03/14 0900 11/04/14 0341 11/05/14 0535  WBC 9.9  < > 9.3 8.8 9.0 8.0  NEUTROABS 8.0*  --   --   --   --   --   HGB 9.7*  < > 9.0* 9.2* 9.1* 9.3*  HCT 30.8*  < > 28.3* 29.2* 29.2* 29.2*  MCV 102.3*  < > 103.7* 102.1* 101.7* 103.2*  PLT 316  < > 279 297 308 265  < > = values in this interval not displayed.  Medications:    . antiseptic oral rinse  7 mL Mouth Rinse q12n4p  . atorvastatin  40 mg Oral q1800  . aztreonam  1 g Intravenous 3 times per day  . carvedilol  3.125 mg Oral BID WC  . chlorhexidine  15 mL Mouth Rinse BID  . feeding supplement (PRO-STAT SUGAR FREE 64)  30 mL Oral BID  . ferrous sulfate  325 mg Oral Daily  . furosemide  40 mg Intravenous Q6H  . hydrocortisone-pramoxine  1 applicator Rectal BID  . lactulose  10 g Oral BID  . lubiprostone  24 mcg Oral BID WC  . multivitamin with minerals  1 tablet Oral Daily  . pantoprazole  40 mg Oral BID  . potassium chloride  40 mEq Oral Once  . predniSONE  10 mg Oral Q breakfast  . senna-docusate  1 tablet Oral Daily      Albin Felling, MD, MPH Internal Medicine Resident, PGY-II Pager: [phone]   11/06/2014, 7:38 AM

## 2014-11-06 NOTE — Progress Notes (Signed)
ANTIBIOTIC CONSULT NOTE  Pharmacy Consult for Aztreonam Indication: HCAP / ? UTI     Assessment: 80 y.o. female presents from [place] with flank pain. Allergies to both PCN and Levaquin.  Day 6 of aztreonam therapy Cultures negative, Scr has improved off Vancomycin to 0.92, afebrile   Goal of Therapy:  Appropriate Aztreonam dosing  Plan:  Continue Aztreonam 1 gram iv Q 8 hours Continue to follow for LOT -- Add stop date -- 7 days? DC after 9/29 doses?     Allergies  Allergen Reactions  . Ativan [Lorazepam] Itching, Swelling and Rash    Throat, Tongue swelling  . Lexapro [Escitalopram Oxalate] Swelling  . Latex Hives and Rash  . Levaquin [Levofloxacin] Other (See Comments)    Per MAR  . Penicillins Other (See Comments)    Per MAR  . Tetanus-Diphtheria Toxoids Td Other (See Comments)    Per [facility]      Microbiology: Recent Results (from the past 720 hour(s))  Urine culture     Status: None   Collection Time: 11/01/14  4:17 AM  Result Value Ref Range Status   Specimen Description URINE, CATHETERIZED  Final   Special Requests ADDED [id number]  Final   Culture 1,000 COLONIES/mL INSIGNIFICANT GROWTH  Final   Report Status 11/02/2014 FINAL  Final  Blood culture (routine x 2)     Status: None   Collection Time: 11/01/14  5:35 AM  Result Value Ref Range Status   Specimen Description BLOOD RIGHT ANTECUBITAL  Final   Special Requests BOTTLES DRAWN AEROBIC AND ANAEROBIC 10CC  Final   Culture  Setup Time   Final    GRAM POSITIVE COCCI IN CLUSTERS IN BOTH AEROBIC AND ANAEROBIC BOTTLES CRITICAL RESULT CALLED TO, READ BACK BY AND VERIFIED WITH: EUNICE @0109  11/02/14 MKELLY GRAM POSITIVE RODS AEROBIC BOTTLE ONLY    Culture   Final    STAPHYLOCOCCUS SPECIES (COAGULASE NEGATIVE) THE SIGNIFICANCE OF ISOLATING THIS ORGANISM FROM A SINGLE SET OF BLOOD CULTURES WHEN MULTIPLE SETS ARE DRAWN IS UNCERTAIN. PLEASE NOTIFY THE MICROBIOLOGY DEPARTMENT WITHIN ONE WEEK IF SPECIATION AND  SENSITIVITIES ARE REQUIRED. DIPHTHEROIDS(CORYNEBACTERIUM SPECIES) Standardized susceptibility testing for this organism is not available.    Report Status 11/04/2014 FINAL  Final  Blood culture (routine x 2)     Status: None (Preliminary result)   Collection Time: 11/01/14  5:40 AM  Result Value Ref Range Status   Specimen Description BLOOD HAND RIGHT  Final   Special Requests BOTTLES DRAWN AEROBIC AND ANAEROBIC 5CC  Final   Culture NO GROWTH 4 DAYS  Final   Report Status PENDING  Incomplete  MRSA PCR Screening     Status: None   Collection Time: 11/01/14  9:36 AM  Result Value Ref Range Status   MRSA by PCR NEGATIVE NEGATIVE Final    Comment:        The GeneXpert MRSA Assay (FDA approved for NASAL specimens only), is one component of a comprehensive MRSA colonization surveillance program. It is not intended to diagnose MRSA infection nor to guide or monitor treatment for MRSA infections.      Thank you Anette Guarneri, PharmD [phone] 11/06/2014,10:53 AM

## 2014-11-06 NOTE — Consult Note (Signed)
[name]

## 2014-11-07 ENCOUNTER — Ambulatory Visit: Payer: Medicare Other | Admitting: Surgery

## 2014-11-07 DIAGNOSIS — Z515 Encounter for palliative care: Secondary | ICD-10-CM

## 2014-11-07 DIAGNOSIS — J9611 Chronic respiratory failure with hypoxia: Secondary | ICD-10-CM

## 2014-11-07 DIAGNOSIS — G9340 Encephalopathy, unspecified: Secondary | ICD-10-CM

## 2014-11-07 LAB — CBC
HCT: 29 % — ABNORMAL LOW (ref 36.0–46.0)
HEMOGLOBIN: 9.2 g/dL — AB (ref 12.0–15.0)
MCH: 31.7 pg (ref 26.0–34.0)
MCHC: 31.7 g/dL (ref 30.0–36.0)
MCV: 100 fL (ref 78.0–100.0)
PLATELETS: 285 10*3/uL (ref 150–400)
RBC: 2.9 MIL/uL — AB (ref 3.87–5.11)
RDW: 16.4 % — ABNORMAL HIGH (ref 11.5–15.5)
WBC: 9.6 10*3/uL (ref 4.0–10.5)

## 2014-11-07 LAB — GLUCOSE, CAPILLARY
GLUCOSE-CAPILLARY: 56 mg/dL — AB (ref 65–99)
GLUCOSE-CAPILLARY: 75 mg/dL (ref 65–99)
Glucose-Capillary: 105 mg/dL — ABNORMAL HIGH (ref 65–99)
Glucose-Capillary: 77 mg/dL (ref 65–99)

## 2014-11-07 LAB — RENAL FUNCTION PANEL
ALBUMIN: 2.2 g/dL — AB (ref 3.5–5.0)
ANION GAP: 11 (ref 5–15)
BUN: 31 mg/dL — ABNORMAL HIGH (ref 6–20)
CO2: 24 mmol/L (ref 22–32)
Calcium: 8.1 mg/dL — ABNORMAL LOW (ref 8.9–10.3)
Chloride: 94 mmol/L — ABNORMAL LOW (ref 101–111)
Creatinine, Ser: 0.77 mg/dL (ref 0.44–1.00)
GFR calc Af Amer: >60 mL/min (ref >60)
GFR calc non Af Amer: >60 mL/min (ref >60)
GLUCOSE: 64 mg/dL — AB (ref 65–99)
PHOSPHORUS: 3.4 mg/dL (ref 2.5–4.6)
POTASSIUM: 2.9 mmol/L — AB (ref 3.5–5.1)
Sodium: 129 mmol/L — ABNORMAL LOW (ref 135–145)

## 2014-11-07 MED ORDER — POTASSIUM CHLORIDE 10 MEQ/100ML IV SOLN
10.0000 meq | INTRAVENOUS | Status: AC
  Administered 2014-11-07 (×4): 10 meq via INTRAVENOUS
  Filled 2014-11-07 (×4): qty 100

## 2014-11-07 MED ORDER — DEXTROSE 50 % IV SOLN
25.0000 mL | Freq: Once | INTRAVENOUS | Status: AC
  Administered 2014-11-07: 25 mL via INTRAVENOUS

## 2014-11-07 MED ORDER — DEXTROSE-NACL 5-0.45 % IV SOLN
INTRAVENOUS | Status: DC
  Administered 2014-11-07: 14:00:00 via INTRAVENOUS

## 2014-11-07 MED ORDER — DEXTROSE 50 % IV SOLN
INTRAVENOUS | Status: AC
  Administered 2014-11-07: 08:00:00
  Filled 2014-11-07: qty 50

## 2014-11-07 MED ORDER — DEXTROSE-NACL 5-0.9 % IV SOLN
INTRAVENOUS | Status: DC
  Administered 2014-11-07: 15:00:00 via INTRAVENOUS

## 2014-11-07 NOTE — Clinical Social Work Note (Signed)
CSW met with patient and her daughter to discuss discharge plan.  Patient's daughter would like to take patient home because she was not satisfied with the care at the SNF.  Patient's daughter stated she was looking at other facilities, but still would rather take her home.  Patient will need ambulance transport once she is medically ready for discharge and orders have been received.  CSW to continue to follow, case manager aware of patient wanting to return back home.  Jones Broom. [employer], MSW, [place]

## 2014-11-07 NOTE — Progress Notes (Signed)
[facility] PROGRESS NOTE  MAKENSIE MULHALL LPF:[id number] DOB: [birth year] DOA: 11/01/2014 PCP: Osborne Casco, MD  Brief narrative 80 year old female with history of PVD status post recent  PTA L SFA and peroneal artery by Dr. Bridgett Larsson 09/25/14), hypertension, diet-controlled diabetes mellitus, severe hearing loss, GERD, diastolic congestive heart failure, bladder prolapse, mitral valve regurgitation, atrial fibrillation (not on anticoagulant due to GI bleeding), iron deficiency anemia, GI bleeding, chronic respiratory failure on 2 L oxygen at home, CKD-II, recurrent UTI, morbid obesity, who is mostly bedbound since December 2015 (after she had left ankle fracture and possibly peripheral vascular disease) was brought to the ED by her daughter with right flank pain, dysuria, increased urinary frequency and altered mental status. Patient also complained of some chest discomfort. She was somnolent on presentation, had stable vitals and workup showed UA suggestive of UTI, hyponatremia with sodium 126, possible focal infiltration in the left mid lung on chest x-ray and drop in hemoglobin to 9.7 (from baseline of 12.5) Patient admitted for further management.  Assessment/Plan: Acute pyelonephritis  - Left nonobstructive nephrolithiasis on CT scan from 9/20. No hydronephrosis noted. Urine culture with insignificant growth. - Treated empirically on IV aztreonam total of 7 days  Acute encephalopathy Secondary to pyelonephritis and possible pneumonia.  Avoid narcotics or benzos as much as possible.  - Patient with worsening mental status 9/29, ABG with no CO2 retention, CT head with bilateral middle ear mastoid fluid not present previously, with no other acute findings.  Possible healthcare associated pneumonia - Treated with vancomycin and aztreonam. Blood culture growing coag negative staph which appears to be a contaminant. Discontinue vancomycin given supratherapeutic level.  Hyponatremia   secondary to hypervolemia .nephrology consult appreciated , improving with IV diuresis   Iron deficiency anemia with drop in H&H Hemoglobin dropped to 9 from baseline of 12.5. (Has been stable since admission) . Has history of GI bleed. Stool for occult blood negative.. Monitor H&H closely. Continue PPI twice a day and iron supplement. Followed by [facility] as outpatient.  Coagulase-negative +ve blood cx In one out of 2 sets, most likely contaminant.  Chest pain on admission Denies any symptoms at this time. EKG unremarkable. Troponin peaked at 0.05. Repeat echo with EF of 50-55% and no wall motion abnormality.  Peripheral vascular disease Status post PTA L SFA and peroneal artery by Dr. Bridgett Larsson 09/25/14. Continue Plavix and statin. Has a clean superficial ulcer over the left shin. Follows with wound care clinic in [place]. Appreciate wound care evaluation while inpatient.  Chronic Diastolic CHF Continue with IV Lasix for volume overload  A. fib  Not on anticoagulation due to GI bleed. Continue metoprolol.  Hypokalemia - Worsening secondary to IV diuresis,  repeating with IV KCl given not able to tolerate by mouth, recheck in a.m.Marland Kitchen   Chronic respiratory failure Continue O2 via nasal cannula (on 2 L O2 at home)  Morbid obesity  Chronic kidney disease stage II Renal function at baseline  Failure to thrive/hypoglycemia - We'll start on D5 normal saline 50 mL per hour, recommended to be kept nothing by mouth by SLP  Constipation with high stool burden Received SMOG enema yesterday.  Chronic arthritis On chronic low-dose prednisone as outpatient., Continue Tylenol as needed    DVT prophylaxis: Subcutaneous heparin  Diet: Nothing by mouth     Code Status: Full Code, palliative care consulted given multiple co morbidities and FTT Family Communication: daughter at bedside Disposition Plan: Return to SNF once stable  Consults renal  Procedures  ocedures:  CT abdomen  (renal stone study)  Antibiotics:  IV vancomycin and aztreonam stopped 9/29  HPI/Subjective:  patient had few loose bowel movement yesterday with smog enema, complaints of hurting all over.  Objective: Filed Vitals:   11/07/14 1405  BP: 110/59  Pulse: 93  Temp: 97.6 F (36.4 C)  Resp: 20    Intake/Output Summary (Last 24 hours) at 11/07/14 1451 Last data filed at 11/07/14 1405  Gross per 24 hour  Intake      0 ml  Output   1225 ml  Net  -1225 ml   Filed Weights   11/04/14 0526 11/05/14 0436 11/07/14 0600  Weight: 107.9 kg (237 lb 14 oz) 111.4 kg (245 lb 9.5 oz) 106.9 kg (235 lb 10.8 oz)    Exam:   General:  Elderly obese female, extremely hard of hearing, appears to be more responsive today  HEENT:  moist oral mucosa, supple neck  Chest: Diminished breath sounds bilaterally due to body habitus  CVS: Normal S1 and S2, no murmurs   GI Soft, nondistended, nontender, bowel sounds present  Musculoskeletal: Warm, +1 edema, ulcer left tibia (chronic)  CNS: Alert and awake, oriented at baseline , very hard of hearing, chronic left-sided weakness  Data Reviewed: Basic Metabolic Panel:  Recent Labs Lab 11/04/14 0341 11/05/14 0535 11/06/14 0728 11/06/14 1805 11/07/14 0456  NA 126* 125* 128* 128* 129*  K 3.8 3.3* 2.8* 3.5 2.9*  CL 95* 95* 95* 94* 94*  CO2 21* 21* 23 20* 24  GLUCOSE 69 89 76 80 64*  BUN 26* 34* 35* 35* 31*  CREATININE 0.99 0.97 0.92 0.85 0.77  CALCIUM 8.4* 8.0* 8.0* 8.2* 8.1*  PHOS  --   --   --  3.6 3.4   Liver Function Tests:  Recent Labs Lab 11/01/14 0323 11/06/14 1805 11/07/14 0456  AST 17  --   --   ALT 19  --   --   ALKPHOS 123  --   --   BILITOT 0.5  --   --   PROT 5.5*  --   --   ALBUMIN 2.8* 2.6* 2.2*    Recent Labs Lab 11/01/14 0323  LIPASE 16*    Recent Labs Lab 11/01/14 0532  AMMONIA 26   CBC:  Recent Labs Lab 11/01/14 0323  11/02/14 0559 11/03/14 0900 11/04/14 0341 11/05/14 0535 11/07/14 0456  WBC  9.9  < > 9.3 8.8 9.0 8.0 9.6  NEUTROABS 8.0*  --   --   --   --   --   --   HGB 9.7*  < > 9.0* 9.2* 9.1* 9.3* 9.2*  HCT 30.8*  < > 28.3* 29.2* 29.2* 29.2* 29.0*  MCV 102.3*  < > 103.7* 102.1* 101.7* 103.2* 100.0  PLT 316  < > 279 297 308 265 285  < > = values in this interval not displayed. Cardiac Enzymes:  Recent Labs Lab 11/01/14 0656 11/01/14 1340 11/01/14 1934  TROPONINI <0.03 0.05* <0.03   BNP (last 3 results)  Recent Labs  09/20/14 0838 11/02/14 0559  BNP 332.2* 374.7*    ProBNP (last 3 results)  Recent Labs  11/28/13 1110  PROBNP 2108.0*    CBG:  Recent Labs Lab 11/04/14 0742 11/05/14 0745 11/06/14 0759 11/07/14 0743 11/07/14 0830  GLUCAP 75 88 82 56* 105*    Recent Results (from the past 240 hour(s))  Urine culture     Status: None   Collection Time: 11/01/14  4:17 AM  Result Value Ref Range Status   Specimen Description URINE, CATHETERIZED  Final   Special Requests ADDED [phone]  Final   Culture 1,000 COLONIES/mL INSIGNIFICANT GROWTH  Final   Report Status 11/02/2014 FINAL  Final  Blood culture (routine x 2)     Status: None   Collection Time: 11/01/14  5:35 AM  Result Value Ref Range Status   Specimen Description BLOOD RIGHT ANTECUBITAL  Final   Special Requests BOTTLES DRAWN AEROBIC AND ANAEROBIC 10CC  Final   Culture  Setup Time   Final    GRAM POSITIVE COCCI IN CLUSTERS IN BOTH AEROBIC AND ANAEROBIC BOTTLES CRITICAL RESULT CALLED TO, READ BACK BY AND VERIFIED WITH: EUNICE @0109  11/02/14 MKELLY GRAM POSITIVE RODS AEROBIC BOTTLE ONLY    Culture   Final    STAPHYLOCOCCUS SPECIES (COAGULASE NEGATIVE) THE SIGNIFICANCE OF ISOLATING THIS ORGANISM FROM A SINGLE SET OF BLOOD CULTURES WHEN MULTIPLE SETS ARE DRAWN IS UNCERTAIN. PLEASE NOTIFY THE MICROBIOLOGY DEPARTMENT WITHIN ONE WEEK IF SPECIATION AND SENSITIVITIES ARE REQUIRED. DIPHTHEROIDS(CORYNEBACTERIUM SPECIES) Standardized susceptibility testing for this organism is not available.     Report Status 11/04/2014 FINAL  Final  Blood culture (routine x 2)     Status: None   Collection Time: 11/01/14  5:40 AM  Result Value Ref Range Status   Specimen Description BLOOD HAND RIGHT  Final   Special Requests BOTTLES DRAWN AEROBIC AND ANAEROBIC 5CC  Final   Culture NO GROWTH 5 DAYS  Final   Report Status 11/06/2014 FINAL  Final  MRSA PCR Screening     Status: None   Collection Time: 11/01/14  9:36 AM  Result Value Ref Range Status   MRSA by PCR NEGATIVE NEGATIVE Final    Comment:        The GeneXpert MRSA Assay (FDA approved for NASAL specimens only), is one component of a comprehensive MRSA colonization surveillance program. It is not intended to diagnose MRSA infection nor to guide or monitor treatment for MRSA infections.      Studies: Ct Head Wo Contrast  11/06/2014   CLINICAL DATA:  Lethargy today, more than usual per daughter.  EXAM: CT HEAD WITHOUT CONTRAST  TECHNIQUE: Contiguous axial images were obtained from the base of the skull through the vertex without intravenous contrast.  COMPARISON:  06/20/2013.  FINDINGS: Generalized atrophy.  Chronic microvascular ischemic change.  No evidence for acute infarction, hemorrhage, mass lesion, hydrocephalus, or extra-axial fluid.  Calvarium. No sinus air-fluid level. Maxillary and sphenoid sinus retention cysts. Dense lenticular opacities. BILATERAL middle ear and mastoid fluid, not present previously.  IMPRESSION: Atrophy and small vessel disease. No acute intracranial findings are evident.  BILATERAL middle ear mastoid fluid not present previously. Correlate clinically for otitis or mastoiditis.   Electronically Signed   By: Staci Righter M.D.   On: 11/06/2014 09:27    Scheduled Meds: . antiseptic oral rinse  7 mL Mouth Rinse q12n4p  . atorvastatin  40 mg Oral q1800  . carvedilol  3.125 mg Oral BID WC  . chlorhexidine  15 mL Mouth Rinse BID  . feeding supplement (PRO-STAT SUGAR FREE 64)  30 mL Oral BID  . ferrous  sulfate  325 mg Oral Daily  . furosemide  40 mg Intravenous Q6H  . hydrocortisone cream   Topical BID  . hydrocortisone-pramoxine  1 applicator Rectal BID  . lactulose  10 g Oral BID  . lubiprostone  24 mcg Oral BID WC  . multivitamin with minerals  1 tablet Oral Daily  .  pantoprazole  40 mg Oral BID  . potassium chloride  40 mEq Oral Once  . predniSONE  10 mg Oral Q breakfast  . senna-docusate  1 tablet Oral Daily   Continuous Infusions: . dextrose 5 % and 0.9% NaCl 50 mL/hr at 11/07/14 1450      Time spent: 35 minutes    ELGERGAWY, DAWOOD  [facility] Pager [phone] If 7PM-7AM, please contact night-coverage at [email or web address], password [facility] 11/07/2014, 2:51 PM  LOS: 6 days

## 2014-11-07 NOTE — Consult Note (Signed)
Consultation Note Date: 11/07/2014   Patient Name: [name]  DOB: 1935/02/03  MRN: [id number]  Age / Sex: 80 y.o., female   PCP: Kelton Pillar, MD Referring Physician: Albertine Patricia, MD  Reason for Consultation: Establishing goals of care  Palliative Care Assessment and Plan Summary of Established Goals of Care and Medical Treatment Preferences   Clinical Assessment/Narrative: [name] is a 80 year old female with history of PVD status post recent PTA L SFA and peroneal artery (09/25/14), hypertension, diabetes mellitus, severe hearing loss, GERD, diastolic congestive heart failure, bladder prolapse, mitral valve regurgitation, atrial fibrillation (not on anticoagulant due to GI bleeding), iron deficiency anemia, GI bleeding, chronic respiratory failure on 2 L oxygen at home, CKD-II, recurrent UTI, morbid obesity, who is mostly bedbound since December 2015 (after she had left ankle fracture and possibly peripheral vascular disease) who was residing at nursing home and was brought to the ED by her daughter due to complaints of right flank pain, dysuria, increased urinary frequency and altered mental status. Workup showed UA suggestive of UTI, hyponatremia with sodium 126, possible focal infiltration in the left mid lung on chest x-ray and drop in hemoglobin to 9.7 (from baseline of 12.5). Palliative consulted for goals of care discussion.  I met this morning with [name] and her daughter, [name].  [name] is very hard of hearing and also remained somewhat confused and so much the conversation was had with her daughter.  We discussed what is most important to [name] moving forward. Her daughter reports she was a private and independent woman up until having to live in nursing facility for the past year or so. She reports that the patient's family, being at home, and her faith are all very important to her. She feels that her mother has had one complication after another since being at  the nursing home. This included multiple bouts of pneumonia, urinary tract infection, and falls resulting in broken ankle and chronic lower extremity wound. The wound has been healing since she had vascular surgery in August of this year. She reports that she is really working to prevent her mother from being placed back in another facility.  She reports having a good understanding of their mother's medical problems and is able to relay some information about most of the problems on her mother's problem list.    We discussed that in light of multiple chronic medical problems that have worsened with this acute problem, care should be focused on interventions that are likely to allow the patient to achieve goals of getting back to home and spending time with family. I discussed with [name] heroic interventions at the end-of-life and how with her chronic problems and frail state that her mother would likely never recover enough to leave the hospital if she were to be resuscitated at the time of her death.  She reports understanding this and would like to think about this tonight. She is agreeable to sitting down tomorrow with me to discuss development of a plan of care focusing on her mother's goals and using a MOST form as a framework.  We also talked about the challenges her mother has been facing over the past several months to years. We discussed that the hospital can be useful as long as her mother is getting well enough from care she receives at the hospital to enjoy his time at home, but there is going to come a time in the near future where, as her goal is  to be at home, she may be better served to plan on being at home and bring care to her at home rather repeated trips to the hospital. We discussed hospice as a tool that may be beneficial in this goal as she reaches a point where we are trying to fix problems that are not fixable.  [name] reports that she worked at [employer] for a period of time in  the past and is very Investment banker, corporate with the quality care can be provided through hospice organizations.  Despite verbalizing she understands that her mother will require very high level of care, [name] is very invested in her plan to take her mother home with home health. She reports she will have other family available and that she will be cared for 24 hours per day. She is in agreement that a good plan would be to plan to take him home with home health as they have previously been arranging.  If her mother does well at home and continues to thrive, I encouraged they continue with this plan. If, however she is unable to regain function and she continues to decline, I recommended that she speak with her PCP to determine if she may be better served by focusing her care on staying at home with support of organization such as hospice.  - The patient's daughter, [name], and I had long discussion regarding continued decline in her mother's cognition, functional status, and nutrition. - She remained very invested in plan to take her mother home with home health. She was not interested in discussing other disposition options and reports she is familiar with hospice and will get them involved if her mother does not thrive with home rehabilitation. - We also discussed that the care her mother receives in the hospital should be focused on care that is likely to help her achieve her goals of being out of the hospital and spending time with family. Her daughter is agreeable to continuing this discussion tomorrow and completing a MOST form. We will continue to talk about her CODE STATUS at that time. [name] wanted to have this evening to think things over prior to making any changes. - I will follow-up with family tomorrow.  Contacts/Participants in Discussion: Primary Decision Maker: Patient's daughter, [name]   HCPOA: None on chart  Code Status/Advance Care Planning:  Full code. To continue discussion  tomorrow.  Symptom Management:   Constipation: Patient received enema.  Additional Recommendations (Limitations, Scope, Preferences):  No change in her care at this time. Her daughter and I did have a long discussion regarding her long-term goals as well as setting limits on care she receives when she is at the hospital if that care is not likely to allow her to get well enough to leave the hospital. She seemed to be understanding this and is agreeable to completing a MOST form tomorrow in order to further clarify what care would be most in line with her mother's goals   Psycho-social/Spiritual:   Support System: Her family  Desire for further Chaplaincy support: Did not discuss today  Prognosis: Unable to determine due to acute hospitalization. Overall, if she continues to have decline in her functional status, her prognosis over the next 6 months would remain poor and she could likely qualify for hospice services  Discharge Planning:  Home with [place]       Chief Complaint/History of Present Illness: 80 year old female with multiple comorbid medical conditions who resides in nursing facility and is admitted  for flank pain, dysuria, and altered mental status thought likely due to UTI  Primary Diagnoses  Present on Admission:  . Mitral regurgitation . Iron deficiency anemia . Hyponatremia . Hyperlipidemia . GERD (gastroesophageal reflux disease) . Female bladder prolapse . Diastolic CHF . CONSTIPATION, CHRONIC . CKD (chronic kidney disease), stage III . Atrial fibrillation . Flank pain . UTI (lower urinary tract infection) . Pyelonephritis . Acute encephalopathy . Atherosclerotic PVD with ulceration . Chronic respiratory failure with hypoxia  Palliative Review of Systems: Patient not reliable historian I have reviewed the medical record, interviewed the patient and family, and examined the patient. The following aspects are pertinent.  Past Medical History    Diagnosis Date  . HTN (hypertension)   . Diverticulosis   . Hemorrhoid   . Constipation, chronic   . Systolic and diastolic CHF, chronic     a. Echo 07/2011: Technically limited; inferior HK, inferolateral and possibly lateral wall HK, EF 40%, moderate LVE, aortic sclerosis without stenosis, mild MR, mild LAE, question RV dysfunction;   b.  Echo (04/2013): EF 50-55%, normal wall motion, moderate MR, mild to moderate LAE, PASP 37  . Female bladder prolapse     with prolapse uterus, cystocele, s/p TVH/BSO 12/2006  . Obesity (BMI 30-39.9)     wt. 94.1 kg 12/2010  . Moderate mitral regurgitation     a. Mod to Sev by echo 01/2009;  b. mild MR by echo 2013  . Atrial fibrillation 10/13/2011    coumadin Rx  . Iron deficiency anemia   . Shortness of breath dyspnea   . GERD (gastroesophageal reflux disease)   . Hyperlipidemia   . Fracture, ankle Dec. 26, 2015    Left  . Fall at home March 2016  . Dysrhythmia   . History of hiatal hernia   . Pneumonia "so many times"  . Recurrent UTI (urinary tract infection)     "she had 3 last month" (11/01/2014)  . DM type 2 (diabetes mellitus, type 2) dx'd 07/25/2014  . History of blood transfusion 2015    "related to LGIB"  . Lower GI bleeding 2015  . Osteoarthritis     "everywhere" (11/01/2014)  . Kidney stones     "has been passing them for the last 5 days" (11/01/2014)  . Chronic kidney disease (CKD), stage II (mild)     Archie Endo 11/01/2014   Social History   Social History  . Marital Status: Single    Spouse Name: N/A  . Number of Children: N/A  . Years of Education: N/A   Occupational History  . retired Gaffer    Social History Main Topics  . Smoking status: Never Smoker   . Smokeless tobacco: Former Systems developer    Types: Snuff  . Alcohol Use: Yes     Comment: "drank some years and years ago"  . Drug Use: No  . Sexual Activity: Not Currently   Other Topics Concern  . None   Social History Narrative   Lives in [place] by herself.   Her dtr lives near by and prepares all of her meals and leaves them for the pt to heat up in the microwave.  Dtr pays close attn to salt in meals.  Pt is sedentary.  Most activity is moving from couch to bed.  She ambulates w/ a walker.  Wt is up from 217 in January to 244 now.   Family History  Problem Relation Age of Onset  . Diabetes Brother   . Hypertension  Mother     deceased @ 10  . Leukemia Brother   . Cancer Brother   . Stroke Mother   . Diabetes Father     deceased in his 40's   Scheduled Meds: . antiseptic oral rinse  7 mL Mouth Rinse q12n4p  . atorvastatin  40 mg Oral q1800  . carvedilol  3.125 mg Oral BID WC  . chlorhexidine  15 mL Mouth Rinse BID  . feeding supplement (PRO-STAT SUGAR FREE 64)  30 mL Oral BID  . ferrous sulfate  325 mg Oral Daily  . furosemide  40 mg Intravenous Q6H  . hydrocortisone cream   Topical BID  . hydrocortisone-pramoxine  1 applicator Rectal BID  . lactulose  10 g Oral BID  . lubiprostone  24 mcg Oral BID WC  . multivitamin with minerals  1 tablet Oral Daily  . pantoprazole  40 mg Oral BID  . potassium chloride  10 mEq Intravenous Q1 Hr x 5  . potassium chloride  40 mEq Oral Once  . predniSONE  10 mg Oral Q breakfast  . senna-docusate  1 tablet Oral Daily   Continuous Infusions:  PRN Meds:.acetaminophen **OR** acetaminophen, albuterol, bisacodyl, magnesium hydroxide, ondansetron, polyethylene glycol, simethicone Medications Prior to Admission:  Prior to Admission medications   Medication Sig Start Date End Date Taking? Authorizing Provider  albuterol (PROVENTIL) (2.5 MG/3ML) 0.083% nebulizer solution Take 2.5 mg by nebulization every 4 (four) hours as needed for wheezing or shortness of breath (For wheezing and shortness of breath).    Yes Historical Provider, MD  Amino Acids-Protein Hydrolys (FEEDING SUPPLEMENT, PRO-STAT SUGAR FREE 64,) LIQD Take 30 mLs by mouth 2 (two) times daily.   Yes Historical Provider, MD  atorvastatin (LIPITOR) 40  MG tablet Take 40 mg by mouth daily.   Yes Historical Provider, MD  bisacodyl (DULCOLAX) 10 MG suppository Place 10 mg rectally daily as needed for moderate constipation (if constipation not relieved by MOM).   Yes Historical Provider, MD  carvedilol (COREG) 3.125 MG tablet Take 3.125 mg by mouth 2 (two) times daily with a meal. Hold if SBP <100   Yes Historical Provider, MD  ferrous sulfate 325 (65 FE) MG tablet Take 325 mg by mouth daily.   Yes Historical Provider, MD  HYDROcodone-acetaminophen (NORCO/VICODIN) 5-325 MG per tablet Take 1 tablet by mouth 3 (three) times daily as needed (pain). 09/20/14  Yes Shanker Kristeen Mans, MD  HYDROcodone-acetaminophen (NORCO/VICODIN) 5-325 MG per tablet Take 1 tablet by mouth every 8 (eight) hours.   Yes Historical Provider, MD  lactulose (CHRONULAC) 10 GM/15ML solution Take 10 g by mouth 2 (two) times daily.    Yes Historical Provider, MD  levalbuterol Penne Lash) 0.63 MG/3ML nebulizer solution Take 0.63 mg by nebulization every 4 (four) hours as needed for wheezing or shortness of breath.   Yes Historical Provider, MD  lubiprostone (AMITIZA) 24 MCG capsule Take 1 capsule (24 mcg total) by mouth 2 (two) times daily with a meal. Patient taking differently: Take 24 mcg by mouth 2 (two) times daily with a meal. For constipation 07/19/13  Yes Velvet Bathe, MD  magnesium hydroxide (MILK OF MAGNESIA) 400 MG/5ML suspension Take 30 mLs by mouth daily as needed for mild constipation.   Yes Historical Provider, MD  metolazone (ZAROXOLYN) 2.5 MG tablet Take 2.5 mg by mouth once a week. Weekly on Wed   Yes Historical Provider, MD  ondansetron (ZOFRAN) 8 MG tablet Take 8 mg by mouth every 8 (eight) hours as needed  for nausea or vomiting.   Yes Historical Provider, MD  pantoprazole (PROTONIX) 40 MG tablet Take 1 tablet (40 mg total) by mouth 2 (two) times daily. 08/10/13  Yes Barton Dubois, MD  polyethylene glycol [facility] / GLYCOLAX) packet Take 17 g by mouth daily.   Yes Historical  Provider, MD  pramoxine (PROCTOFOAM) 1 % foam Place 1 application rectally 2 (two) times daily.   Yes Historical Provider, MD  predniSONE (DELTASONE) 10 MG tablet Take 10 mg by mouth daily with breakfast.    Yes Historical Provider, MD  sennosides-docusate sodium (SENOKOT-S) 8.6-50 MG tablet Take 1 tablet by mouth daily.   Yes Historical Provider, MD  simethicone (MYLICON) 80 MG chewable tablet Chew 80 mg by mouth as needed for flatulence.   Yes Historical Provider, MD  Sodium Phosphates (RA SALINE ENEMA RE) Place 1 each rectally daily as needed (constipation).   Yes Historical Provider, MD  traMADol (ULTRAM) 50 MG tablet Take 1 tablet (50 mg total) by mouth every 8 (eight) hours. 09/20/14  Yes Shanker Kristeen Mans, MD  UNABLE TO FIND Take by mouth 2 (two) times daily. Med Name: Magic Cup   Yes Historical Provider, MD  UNABLE TO FIND Take 120 mLs by mouth 2 (two) times daily. Med Name: Med Pass   Yes Historical Provider, MD  clopidogrel (PLAVIX) 75 MG tablet Take 1 tablet (75 mg total) by mouth daily. Patient not taking: Reported on 11/01/2014 09/25/14   Conrad Coplay, MD  potassium chloride SA (K-DUR,KLOR-CON) 20 MEQ tablet Take 1 tablet (20 mEq total) by mouth daily. Patient not taking: Reported on 11/01/2014 09/20/14   Jonetta Osgood, MD   Allergies  Allergen Reactions  . Ativan [Lorazepam] Itching, Swelling and Rash    Throat, Tongue swelling  . Lexapro [Escitalopram Oxalate] Swelling  . Latex Hives and Rash  . Levaquin [Levofloxacin] Other (See Comments)    Per MAR  . Penicillins Other (See Comments)    Per MAR  . Tetanus-Diphtheria Toxoids Td Other (See Comments)    Per MAR   CBC:    Component Value Date/Time   WBC 9.6 11/07/2014 0456   HGB 9.2* 11/07/2014 0456   HCT 29.0* 11/07/2014 0456   PLT 285 11/07/2014 0456   MCV 100.0 11/07/2014 0456   NEUTROABS 8.0* 11/01/2014 0323   LYMPHSABS 1.3 11/01/2014 0323   MONOABS 0.5 11/01/2014 0323   EOSABS 0.1 11/01/2014 0323   BASOSABS 0.0  11/01/2014 0323   Comprehensive Metabolic Panel:    Component Value Date/Time   NA 129* 11/07/2014 0456   K 2.9* 11/07/2014 0456   CL 94* 11/07/2014 0456   CO2 24 11/07/2014 0456   BUN 31* 11/07/2014 0456   CREATININE 0.77 11/07/2014 0456   CREATININE 1.62* 02/22/2014 1613   GLUCOSE 64* 11/07/2014 0456   CALCIUM 8.1* 11/07/2014 0456   AST 17 11/01/2014 0323   ALT 19 11/01/2014 0323   ALKPHOS 123 11/01/2014 0323   BILITOT 0.5 11/01/2014 0323   PROT 5.5* 11/01/2014 0323   ALBUMIN 2.2* 11/07/2014 0456    Physical Exam: Vital Signs: BP 132/84 mmHg  Pulse 78  Temp(Src) 98.7 F (37.1 C) (Oral)  Resp 22  Ht $R'5\' 2"'Wu$  (1.575 m)  Wt 106.9 kg (235 lb 10.8 oz)  BMI 43.09 kg/m2  SpO2 96% SpO2: SpO2: 96 % O2 Device: O2 Device: Nasal Cannula O2 Flow Rate: O2 Flow Rate (L/min): 2 L/min Intake/output summary:  Intake/Output Summary (Last 24 hours) at 11/07/14 1214 Last data filed  at 11/07/14 1030  Gross per 24 hour  Intake      0 ml  Output   1125 ml  Net  -1125 ml   LBM: Last BM Date: 11/06/14 Baseline Weight: Weight: 106.2 kg (234 lb 2.1 oz) Most recent weight: Weight: 106.9 kg (235 lb 10.8 oz)  Exam Findings:   General: Elderly obese female, poor hearing, responsive to loud verbal or tactile stimuli   HEENT: moist oral mucosa, supple neck  Chest: Diminished breath sounds bilaterally  CVS: Normal S1 and S2, no murmurs   GI Soft, nondistended, nontender, bowel sounds present  Musculoskeletal: Warm, +1 edema, leg ulceration noted  CNS: Alert and awake, appears imminently confused, very hard of hearing, chronic left-sided weakness         Palliative Performance Scale:40%               Additional Data Reviewed: Recent Labs     11/05/14  0535   11/06/14  1805  11/07/14  0456  WBC  8.0   --    --   9.6  HGB  9.3*   --    --   9.2*  PLT  265   --    --   285  NA  125*   < >  128*  129*  BUN  34*   < >  35*  31*  CREATININE  0.97   < >  0.85  0.77   < > =  values in this interval not displayed.     Time In: 1020 Time Out: 1135 Time Total: 75 Greater than 50%  of this time was spent counseling and coordinating care related to the above assessment and plan.  Signed by: Micheline Rough, MD  Micheline Rough, MD  11/07/2014, 12:14 PM  Please contact Palliative Medicine Team phone at [phone] for questions and concerns.

## 2014-11-07 NOTE — Progress Notes (Signed)
ANTIBIOTIC CONSULT NOTE  Pharmacy Consult for Aztreonam Indication: HCAP / ? UTI     Assessment: 80 y.o. female presents from [facility] with flank pain. Allergies to both PCN and Levaquin.  Day 7 of aztreonam therapy Cultures negative, Scr has improved off Vancomycin to 0.92, afebrile   Goal of Therapy:  Appropriate Aztreonam dosing  Plan:  Continue Aztreonam 1 gram iv Q 8 hours Aztreonam now stopped after discussion with doctor during rounds     Allergies  Allergen Reactions  . Ativan [Lorazepam] Itching, Swelling and Rash    Throat, Tongue swelling  . Lexapro [Escitalopram Oxalate] Swelling  . Latex Hives and Rash  . Levaquin [Levofloxacin] Other (See Comments)    Per MAR  . Penicillins Other (See Comments)    Per MAR  . Tetanus-Diphtheria Toxoids Td Other (See Comments)    Per [facility]      Microbiology: Recent Results (from the past 720 hour(s))  Urine culture     Status: None   Collection Time: 11/01/14  4:17 AM  Result Value Ref Range Status   Specimen Description URINE, CATHETERIZED  Final   Special Requests ADDED [id number] [phone]  Final   Culture 1,000 COLONIES/mL INSIGNIFICANT GROWTH  Final   Report Status 11/02/2014 FINAL  Final  Blood culture (routine x 2)     Status: None   Collection Time: 11/01/14  5:35 AM  Result Value Ref Range Status   Specimen Description BLOOD RIGHT ANTECUBITAL  Final   Special Requests BOTTLES DRAWN AEROBIC AND ANAEROBIC 10CC  Final   Culture  Setup Time   Final    GRAM POSITIVE COCCI IN CLUSTERS IN BOTH AEROBIC AND ANAEROBIC BOTTLES CRITICAL RESULT CALLED TO, READ BACK BY AND VERIFIED WITH: EUNICE @0109  11/02/14 MKELLY GRAM POSITIVE RODS AEROBIC BOTTLE ONLY    Culture   Final    STAPHYLOCOCCUS SPECIES (COAGULASE NEGATIVE) THE SIGNIFICANCE OF ISOLATING THIS ORGANISM FROM A SINGLE SET OF BLOOD CULTURES WHEN MULTIPLE SETS ARE DRAWN IS UNCERTAIN. PLEASE NOTIFY THE MICROBIOLOGY DEPARTMENT WITHIN ONE WEEK IF SPECIATION AND  SENSITIVITIES ARE REQUIRED. DIPHTHEROIDS(CORYNEBACTERIUM SPECIES) Standardized susceptibility testing for this organism is not available.    Report Status 11/04/2014 FINAL  Final  Blood culture (routine x 2)     Status: None   Collection Time: 11/01/14  5:40 AM  Result Value Ref Range Status   Specimen Description BLOOD HAND RIGHT  Final   Special Requests BOTTLES DRAWN AEROBIC AND ANAEROBIC 5CC  Final   Culture NO GROWTH 5 DAYS  Final   Report Status 11/06/2014 FINAL  Final  MRSA PCR Screening     Status: None   Collection Time: 11/01/14  9:36 AM  Result Value Ref Range Status   MRSA by PCR NEGATIVE NEGATIVE Final    Comment:        The GeneXpert MRSA Assay (FDA approved for NASAL specimens only), is one component of a comprehensive MRSA colonization surveillance program. It is not intended to diagnose MRSA infection nor to guide or monitor treatment for MRSA infections.      Thank you Anette Guarneri, PharmD [phone] 11/07/2014,11:14 AM

## 2014-11-07 NOTE — Progress Notes (Signed)
[facility] Progress Note    Assessment/ Plan:   1. Hypervolemic Hyponatremia: Na improved from 125>128>129 with diuresis. UOP continues to do well with 1650 ml over the last 24 hours. Recommend to continue IV Lasix for another 24 hours as she still has quite a bit of fluid on. Can then transition to orals.  2. CKD Stage II: Cr stable at baseline, 0.7-0.9.  3. Pyelonephritis/Possible HCAP: Currently on Aztreonam. Treatment per primary team.  4. Hypokalemia: K 2.9 today. Yesterday, K improved from 2.8 to 3.5 after 5 runs of KCl 10 mEq. Will give another 5 runs KCl.  5. Metabolic Acidosis: Bicarb 24 this AM. Resolved.  6. Iron Deficiency Anemia: Hgb 9.7 on admission, stable at 9.2. She is on iron supplementation at home and this is being continued in hospital. Continue to monitor. Oral  We will sign off at this time. Please let us know if we can help in any way.   Subjective:   Patient hypoglycemic this morning, received dextrose. She is more alert and answering questions appropriately this morning compared to yesterday. CT Head 9/28 negative for any acute abnormalities.    Objective:   BP 109/62 mmHg  Pulse 87  Temp(Src) 98.4 F (36.9 C) (Oral)  Resp 20  Ht 5\' 2"  (1.575 m)  Wt 235 lb 10.8 oz (106.9 kg)  BMI 43.09 kg/m2  SpO2 100%  Intake/Output Summary (Last 24 hours) at 11/07/14 0731 Last data filed at 11/07/14 0600  Gross per 24 hour  Intake      0 ml  Output   1650 ml  Net  -1650 ml   Weight change:   Physical Exam: Gen: alert, sitting up in bed, NAD CVS: RRR, no m/g/r Resp: CTA bilaterally, on 2 L oxygen via [place] Abd: BS+, soft, non-tender Ext: 1-2+ peripheral edema in lower extremities   Imaging: Ct Head Wo Contrast  11/06/2014   CLINICAL DATA:  Lethargy today, more than usual per daughter.  EXAM: CT HEAD WITHOUT CONTRAST  TECHNIQUE: Contiguous axial images were obtained from the base of the skull through the vertex without intravenous contrast.   COMPARISON:  06/20/2013.  FINDINGS: Generalized atrophy.  Chronic microvascular ischemic change.  No evidence for acute infarction, hemorrhage, mass lesion, hydrocephalus, or extra-axial fluid.  Calvarium. No sinus air-fluid level. Maxillary and sphenoid sinus retention cysts. Dense lenticular opacities. BILATERAL middle ear and mastoid fluid, not present previously.  IMPRESSION: Atrophy and small vessel disease. No acute intracranial findings are evident.  BILATERAL middle ear mastoid fluid not present previously. Correlate clinically for otitis or mastoiditis.   Electronically Signed   By: Staci Righter M.D.   On: 11/06/2014 09:27    Labs: BMET  Recent Labs Lab 11/02/14 0559 11/03/14 0900 11/04/14 0341 11/05/14 0535 11/06/14 0728 11/06/14 1805 11/07/14 0456  NA 124* 125* 126* 125* 128* 128* 129*  K 4.2 4.4 3.8 3.3* 2.8* 3.5 2.9*  CL 90* 92* 95* 95* 95* 94* 94*  CO2 25 24 21* 21* 23 20* 24  GLUCOSE 88 77 69 89 76 80 64*  BUN 25* 29* 26* 34* 35* 35* 31*  CREATININE 1.24* 1.18* 0.99 0.97 0.92 0.85 0.77  CALCIUM 8.1* 8.3* 8.4* 8.0* 8.0* 8.2* 8.1*  PHOS  --   --   --   --   --  3.6 3.4   CBC  Recent Labs Lab 11/01/14 0323  11/03/14 0900 11/04/14 0341 11/05/14 0535 11/07/14 0456  WBC 9.9  < > 8.8 9.0 8.0 9.6  NEUTROABS 8.0*  --   --   --   --   --   HGB 9.7*  < > 9.2* 9.1* 9.3* 9.2*  HCT 30.8*  < > 29.2* 29.2* 29.2* 29.0*  MCV 102.3*  < > 102.1* 101.7* 103.2* 100.0  PLT 316  < > 297 308 265 285  < > = values in this interval not displayed.  Medications:    . antiseptic oral rinse  7 mL Mouth Rinse q12n4p  . atorvastatin  40 mg Oral q1800  . aztreonam  1 g Intravenous 3 times per day  . carvedilol  3.125 mg Oral BID WC  . chlorhexidine  15 mL Mouth Rinse BID  . feeding supplement (PRO-STAT SUGAR FREE 64)  30 mL Oral BID  . ferrous sulfate  325 mg Oral Daily  . furosemide  40 mg Intravenous Q6H  . hydrocortisone cream   Topical BID  . hydrocortisone-pramoxine  1  applicator Rectal BID  . lactulose  10 g Oral BID  . lubiprostone  24 mcg Oral BID WC  . multivitamin with minerals  1 tablet Oral Daily  . pantoprazole  40 mg Oral BID  . potassium chloride  10 mEq Intravenous Q1 Hr x 5  . potassium chloride  40 mEq Oral Once  . predniSONE  10 mg Oral Q breakfast  . senna-docusate  1 tablet Oral Daily      Albin Felling, MD, MPH Internal Medicine Resident, PGY-II Pager: [phone]   11/07/2014, 7:31 AM

## 2014-11-07 NOTE — Evaluation (Signed)
Clinical/Bedside Swallow Evaluation Patient Details  Name: [name] MRN: [id number] Date of Birth: 04/15/1934  Today's Date: 11/07/2014 Time: SLP Start Time (ACUTE ONLY): 1342 SLP Stop Time (ACUTE ONLY): 1358 SLP Time Calculation (min) (ACUTE ONLY): 16 min  Past Medical History:  Past Medical History  Diagnosis Date  . HTN (hypertension)   . Diverticulosis   . Hemorrhoid   . Constipation, chronic   . Systolic and diastolic CHF, chronic     a. Echo 07/2011: Technically limited; inferior HK, inferolateral and possibly lateral wall HK, EF 40%, moderate LVE, aortic sclerosis without stenosis, mild MR, mild LAE, question RV dysfunction;   b.  Echo (04/2013): EF 50-55%, normal wall motion, moderate MR, mild to moderate LAE, PASP 37  . Female bladder prolapse     with prolapse uterus, cystocele, s/p TVH/BSO 12/2006  . Obesity (BMI 30-39.9)     wt. 94.1 kg 12/2010  . Moderate mitral regurgitation     a. Mod to Sev by echo 01/2009;  b. mild MR by echo 2013  . Atrial fibrillation 10/13/2011    coumadin Rx  . Iron deficiency anemia   . Shortness of breath dyspnea   . GERD (gastroesophageal reflux disease)   . Hyperlipidemia   . Fracture, ankle Dec. 26, 2015    Left  . Fall at home March 2016  . Dysrhythmia   . History of hiatal hernia   . Pneumonia "so many times"  . Recurrent UTI (urinary tract infection)     "she had 3 last month" (11/01/2014)  . DM type 2 (diabetes mellitus, type 2) dx'd 07/25/2014  . History of blood transfusion 2015    "related to LGIB"  . Lower GI bleeding 2015  . Osteoarthritis     "everywhere" (11/01/2014)  . Kidney stones     "has been passing them for the last 5 days" (11/01/2014)  . Chronic kidney disease (CKD), stage II (mild)     Archie Endo 11/01/2014   Past Surgical History:  Past Surgical History  Procedure Laterality Date  . Wrist fracture surgery Left 2000's  . Colonoscopy N/A 07/18/2013    Procedure: COLONOSCOPY;  Surgeon: Wonda Horner, MD;   Location: [facility] END[facility];  Service: Endoscopy;  Laterality: N/A;  . Flexible sigmoidoscopy N/A 03/08/2014    Procedure: FLEXIBLE SIGMOIDOSCOPY;  Surgeon: Lear Ng, MD;  Location: [facility] ENDOSCOPY;  Service: Endoscopy;  Laterality: N/A;  need hoyer lift  . Aortogram Left 09/25/2014    Procedure: AORTOGRAM WITH LEFT LEG LOWER EXTREMIY RUNOFF; ANGIOPLASTY OF TIBIAL AND PERONEAL ARTERY;  Surgeon: Conrad Nokomis, MD;  Location: [facility];  Service: Vascular;  Laterality: Left;  . Vaginal hysterectomy    . Vaginal hysterectomy,cystocele and prolapse  repair    . Fracture surgery     HPI:  80 year old female with history of PVD status post recent  PTA L SFA and peroneal artery by Dr. Bridgett Larsson 09/25/14), hypertension, diet-controlled diabetes mellitus, severe hearing loss, GERD, diastolic congestive heart failure, bladder prolapse, mitral valve regurgitation, atrial fibrillation (not on anticoagulant due to GI bleeding), iron deficiency anemia, GI bleeding, chronic respiratory failure on 2 L oxygen at home, CKD-II, recurrent UTI, morbid obesity, who is mostly bedbound since December 2015 (after she had left ankle fracture and possibly peripheral vascular disease).  Patient was brought to the ED by her daughter with right flank pain, dysuria, increased urinary frequency, altered mental status and some chest discomfort.  UA suggestive of UTI, and possible focal infiltration in the  left mid lung on chest x-ray.  Patient has been consuming a Dys.1 texture diet with thin liquids but diet was discontinued and orders were received for bedside swallow evaluation.     Assessment / Plan / Recommendation Clinical Impression  Bedside swallow evaluation initiated.  Patient required moderate verbal and tactile cues for arousal for PO intake.  Patient initially not orally accepting of PO but with encouragement accepted ice chips and water via straw.  Thin liquids via straw resulted in multiple swallows and audible wheezes, despite  cues for phonation SLP unable to assess vocal quality.  Trials of puree resulted in oral holding with poor awareness of bolus; despite Max cues for attention to bolus and swallow initiation patient did not swallow and bolus was removed via suctioning.  Patient verbally expressed frustration out of confusion "Why are you bothering me?" "Leave me alone and lay me down."  Cognition limited ability to complete evaluation.  As a result, recommend patient remain NPO except meds crushed in puree with multiple swallows when patient is accepting of PO.  SLP will follow up in hopes of more patient participation.      Aspiration Risk  Severe    Diet Recommendation NPO   Medication Administration: Crushed with puree (cue for multiple swallows )    Other  Recommendations Oral Care Recommendations: Oral care QID Other Recommendations: Have oral suction available   Follow Up Recommendations  TBD   Frequency and Duration min 2x/week  2 weeks   Pertinent Vitals/Pain none    SLP Swallow Goals See care plan for details   Swallow Study Prior Functional Status  Per chart review, BSE 06/2013 recommended regular textures and thin liquids; however, baseline prior to admission is unknown     General Other Pertinent Information: 80 year old female with history of PVD status post recent  PTA L SFA and peroneal artery by Dr. Bridgett Larsson 09/25/14), hypertension, diet-controlled diabetes mellitus, severe hearing loss, GERD, diastolic congestive heart failure, bladder prolapse, mitral valve regurgitation, atrial fibrillation (not on anticoagulant due to GI bleeding), iron deficiency anemia, GI bleeding, chronic respiratory failure on 2 L oxygen at home, CKD-II, recurrent UTI, morbid obesity, who is mostly bedbound since December 2015 (after she had left ankle fracture and possibly peripheral vascular disease).  Patient was brought to the ED by her daughter with right flank pain, dysuria, increased urinary frequency, altered  mental status and some chest discomfort.  UA suggestive of UTI, and possible focal infiltration in the left mid lung on chest x-ray.  Patient has been consuming a Dys.1 texture diet with thin liquids but diet was discontinued and orders were received for bedside swallow evaluation.   Type of Study: Bedside swallow evaluation Previous Swallow Assessment: Bedside swallow eval in 06/2013 recommended regular and thin  Diet Prior to this Study: NPO Temperature Spikes Noted: No Respiratory Status: Supplemental O2 delivered via (comment) () History of Recent Intubation: No Behavior/Cognition: Confused;Lethargic/Drowsy;Requires cueing;Doesn't follow directions;Fusing/Irritable Oral Cavity - Dentition: Edentulous Self-Feeding Abilities: Total assist Patient Positioning: Partially reclined Baseline Vocal Quality: Not observed Volitional Cough: Cognitively unable to elicit Volitional Swallow: Unable to elicit    Oral/Motor/Sensory Function Overall Oral Motor/Sensory Function: Impaired Labial ROM: Reduced right;Reduced left Labial Symmetry: Within Functional Limits Labial Strength: Reduced Lingual ROM: Reduced right;Reduced left Lingual Strength: Reduced Facial Strength: Reduced   Ice Chips Ice chips: Impaired Presentation: Spoon Pharyngeal Phase Impairments: Other (comments) (multiple swallows)   Thin Liquid Thin Liquid: Impaired Presentation: Straw Pharyngeal  Phase Impairments: Suspected delayed Swallow;Multiple swallows;Other (  comments) (expiratory wheezes, audible wetness despite no phonation )    Nectar Thick Nectar Thick Liquid: Not tested   Honey Thick Honey Thick Liquid: Not tested   Puree Puree: Impaired Presentation: Spoon Oral Phase Impairments: Poor awareness of bolus Oral Phase Functional Implications: Oral holding Pharyngeal Phase Impairments: Other (comments) (no swallow initiation )   Solid   GO    Solid: Not tested      Gunnar Fusi, M.A.,  CCC-SLP [phone]  BOWIE,MELISSA 11/07/2014,2:17 PM

## 2014-11-07 NOTE — Care Management Important Message (Signed)
Important Message  Patient Details  Name: [name] MRN: [id number] Date of Birth: July 17, 1934   Medicare Important Message Given:  Yes-third notification given    Nathen May 11/07/2014, 12:29 PM

## 2014-11-07 NOTE — Care Management Note (Signed)
Case Management Note  Patient Details  Name: [name] MRN: [id number] Date of Birth: 04-10-1934  Subjective/Objective:                 Date: 9-27 Tuesday Spoke with patient at the bedside along with daughter, [name], [phone]. Introduced self as Tourist information centre manager and explained role in discharge planning and how to be reached. Verified patient lives in [place] in apartment [address], [place] [address] phone is off, Remedy Corporan (son) is contact for the house [phone] patient would have full time supervision by daughter and nieces at this time to best of their knowledge. Patient has DME Ramp to get into home and a hospital bed. Expressed potential need for wheelchair, 3in1, home oxygen, suction, hoyer lift. Explained to daughter that patient can receive [place] RN or Hospice RN, visiting a few times a week, and not providing extended care. Patient's daughter verbalized understanding that patient would require 24/7 supervision with assistance through a private duty health care worker with their responsibility to pay. Explained to daughter to apply to CAPS through PCP to be added to wait list. Daughter verbalized understanding that patient will not have CAPS approved and in place prior to discharge and family will be responsible for providing 24/7 care, and that this may be up to 2 months or more. Patient denied needing help with their medication. Patient will need ambulance transport to home. Verified patient has PCP. Patient states they currently receive [place] services through no one, patient is from a SNF. Patient was provided choice and selected [facility] for home health needs and DME if they arise.  Plan: CM will continue to follow for discharge planning and [facility] resources.   Carles Collet RN BSN CM [phone]   Action/Plan:  Prior to discharge: Patient will need [facility] RN SW HHA PT order and notification to [facility] made of discharge and [place] needs. [facility] will need  notified of discharge and need of delivery of DME, orders for DME placed by CM. Patient will also need MD to place order for home O2. Patient will need ambulance transport to home.        Expected Discharge Date:                  Expected Discharge Plan:  [place]  In-House Referral:     Discharge planning Services  CM Consult  Post Acute Care Choice:    Choice offered to:     DME Arranged:    DME Agency:     HH Arranged:    [place] Agency:     Status of Service:  In process, will continue to follow  Medicare Important Message Given:  Yes-second notification given Date Medicare IM Given:    Medicare IM give by:    Date Additional Medicare IM Given:    Additional Medicare Important Message give by:     If discussed at [place] of Stay Meetings, dates discussed:    Additional Comments:  Carles Collet, RN 11/07/2014, 8:22 AM

## 2014-11-08 ENCOUNTER — Inpatient Hospital Stay (HOSPITAL_COMMUNITY): Payer: Medicare Other

## 2014-11-08 DIAGNOSIS — Z515 Encounter for palliative care: Secondary | ICD-10-CM | POA: Insufficient documentation

## 2014-11-08 DIAGNOSIS — J189 Pneumonia, unspecified organism: Secondary | ICD-10-CM | POA: Insufficient documentation

## 2014-11-08 DIAGNOSIS — Z7189 Other specified counseling: Secondary | ICD-10-CM | POA: Insufficient documentation

## 2014-11-08 LAB — BASIC METABOLIC PANEL
Anion gap: 11 (ref 5–15)
BUN: 25 mg/dL — ABNORMAL HIGH (ref 6–20)
CALCIUM: 8.2 mg/dL — AB (ref 8.9–10.3)
CO2: 22 mmol/L (ref 22–32)
CREATININE: 0.87 mg/dL (ref 0.44–1.00)
Chloride: 102 mmol/L (ref 101–111)
GFR calc non Af Amer: >60 mL/min (ref >60)
Glucose, Bld: 79 mg/dL (ref 65–99)
Potassium: 3.7 mmol/L (ref 3.5–5.1)
SODIUM: 135 mmol/L (ref 135–145)

## 2014-11-08 LAB — GLUCOSE, CAPILLARY
GLUCOSE-CAPILLARY: 108 mg/dL — AB (ref 65–99)
GLUCOSE-CAPILLARY: 125 mg/dL — AB (ref 65–99)
GLUCOSE-CAPILLARY: 79 mg/dL (ref 65–99)
GLUCOSE-CAPILLARY: 82 mg/dL (ref 65–99)
GLUCOSE-CAPILLARY: 83 mg/dL (ref 65–99)
GLUCOSE-CAPILLARY: 88 mg/dL (ref 65–99)
GLUCOSE-CAPILLARY: 88 mg/dL (ref 65–99)
GLUCOSE-CAPILLARY: 95 mg/dL (ref 65–99)
Glucose-Capillary: 136 mg/dL — ABNORMAL HIGH (ref 65–99)

## 2014-11-08 MED ORDER — SIMETHICONE 80 MG PO CHEW: 80.0000 mg | CHEWABLE_TABLET | ORAL | Status: AC | PRN

## 2014-11-08 MED ORDER — METOLAZONE 2.5 MG PO TABS: ORAL_TABLET | ORAL | Status: AC

## 2014-11-08 MED ORDER — PRO-STAT SUGAR FREE PO LIQD: 30.0000 mL | Freq: Two times a day (BID) | ORAL | Status: AC

## 2014-11-08 MED ORDER — ALBUTEROL SULFATE (2.5 MG/3ML) 0.083% IN NEBU: 2.5000 mg | INHALATION_SOLUTION | RESPIRATORY_TRACT | Status: AC | PRN

## 2014-11-08 MED ORDER — DEXTROSE 50 % IV SOLN: 25.0000 mL | INTRAVENOUS | Status: DC | PRN

## 2014-11-08 MED ORDER — POTASSIUM CHLORIDE CRYS ER 20 MEQ PO TBCR: 40.0000 meq | EXTENDED_RELEASE_TABLET | Freq: Every day | ORAL | Status: AC

## 2014-11-08 MED ORDER — POTASSIUM CHLORIDE CRYS ER 20 MEQ PO TBCR: 20.0000 meq | EXTENDED_RELEASE_TABLET | Freq: Every day | ORAL | Status: DC

## 2014-11-08 MED ORDER — BISACODYL 10 MG RE SUPP: 10.0000 mg | Freq: Every day | RECTAL | Status: AC | PRN

## 2014-11-08 MED ORDER — TRAMADOL HCL 50 MG PO TABS: 50.0000 mg | ORAL_TABLET | Freq: Three times a day (TID) | ORAL | Status: AC

## 2014-11-08 MED ORDER — ATORVASTATIN CALCIUM 40 MG PO TABS: 40.0000 mg | ORAL_TABLET | Freq: Every day | ORAL | Status: AC

## 2014-11-08 MED ORDER — ONDANSETRON HCL 8 MG PO TABS: 8.0000 mg | ORAL_TABLET | Freq: Three times a day (TID) | ORAL | Status: AC | PRN

## 2014-11-08 MED ORDER — PREDNISONE 10 MG PO TABS: 10.0000 mg | ORAL_TABLET | Freq: Every day | ORAL | Status: AC

## 2014-11-08 MED ORDER — PANTOPRAZOLE SODIUM 40 MG IV SOLR
40.0000 mg | Freq: Two times a day (BID) | INTRAVENOUS | Status: DC
  Administered 2014-11-08 (×2): 40 mg via INTRAVENOUS
  Filled 2014-11-08 (×2): qty 40

## 2014-11-08 MED ORDER — FUROSEMIDE 40 MG PO TABS: 60.0000 mg | ORAL_TABLET | Freq: Two times a day (BID) | ORAL | Status: AC

## 2014-11-08 MED ORDER — FERROUS SULFATE 325 (65 FE) MG PO TABS: 325.0000 mg | ORAL_TABLET | Freq: Every day | ORAL | Status: AC

## 2014-11-08 MED ORDER — HYDROCODONE-ACETAMINOPHEN 5-325 MG PO TABS: 1.0000 | ORAL_TABLET | Freq: Three times a day (TID) | ORAL | Status: AC | PRN

## 2014-11-08 MED ORDER — SENNA-DOCUSATE SODIUM 8.6-50 MG PO TABS: 1.0000 | ORAL_TABLET | Freq: Every day | ORAL | Status: AC

## 2014-11-08 MED ORDER — CARVEDILOL 3.125 MG PO TABS: 3.1250 mg | ORAL_TABLET | Freq: Two times a day (BID) | ORAL | Status: AC

## 2014-11-08 NOTE — Consult Note (Signed)
MBS completed this date and may be found in imaging section

## 2014-11-08 NOTE — Progress Notes (Signed)
88% on room air at rest. MD aware. 96% 2L O2 via [place].

## 2014-11-08 NOTE — Progress Notes (Signed)
Spoke with daughter [name] several times to finalize plan of discharge reviewing DME ordered, [facility] services ordered, and that they would start 24 to 48 hours after discharge. Per daughters request called [facility] at [phone] to attempt to set up aid care through [facility]. Spoke with Audelia Acton and he stated that the application needs to be recived two days prior o discharge. CM printed out application and informed daughter that it is in patient's room available for her to take and file.

## 2014-11-08 NOTE — Discharge Instructions (Signed)
Diet: Patient to continue with dysphagia 1 , with nectar thick liquid, to be seen by swallow service as an outpatient regarding further adjustment and advancement of her diet.

## 2014-11-08 NOTE — Progress Notes (Addendum)
Per MD patient to be discharged today, called daughter [name] to update, she approved for CM to give her number to [facility] to set up delivery of DME. CM requested O2 order from Dr [employer] and co sign wheelchair order. [place] notified of [place] referral, SW notified of need for transport needs ambulance to home

## 2014-11-08 NOTE — Progress Notes (Addendum)
Daily Progress Note   Patient Name: [name]       Date: 11/08/2014 DOB: 1934/07/15  Age: 80 y.o. MRN#: [id number] Attending Physician: Albertine Patricia, MD Primary Care Physician: Osborne Casco, MD Admit Date: 11/01/2014  Reason for Consultation/Follow-up: Disposition and Establishing goals of care  Subjective: [name] is a 80 year old female with history of PVD status post recent PTA L SFA and peroneal artery (09/25/14), hypertension, diabetes mellitus, severe hearing loss, GERD, diastolic congestive heart failure, bladder prolapse, mitral valve regurgitation, atrial fibrillation (not on anticoagulant due to GI bleeding), iron deficiency anemia, GI bleeding, chronic respiratory failure on 2 L oxygen at home, CKD-II, recurrent UTI, morbid obesity, who is mostly bedbound since December 2015 (after she had left ankle fracture and possibly peripheral vascular disease) who was residing at nursing home and was brought to the ED by her daughter due to complaints of right flank pain, dysuria, increased urinary frequency and altered mental status. Workup showed UA suggestive of UTI, hyponatremia with sodium 126, possible focal infiltration in the left mid lung on chest x-ray and drop in hemoglobin to 9.7 (from baseline of 12.5). Palliative consulted for goals of care discussion.  Interval Events: I met with Ms. Trigo her daughter, [name] this morning. Ms. Kuriakose remained sleepy and confused and did not participate in conversation.  Her daughter reports that they are planning to transition her back to home as we discussed yesterday as soon as today.  We spoke again at length about goals of care and care planning moving forward. We reviewed a plan from yesterday to see how she does at home and consider enrollment in hospice if she is not benefiting from home health with rehabilitation.  I reviewed a MOST form with her daughter and discussed recommendation about focusing her care on  interventions that are likely to help with goal of being at home and feeling as well as she can. We talked about heroic measures at the end-of-life and how if her mother were have cardiac arrest or respiratory failure that her chances of getting well enough to leave the hospital would be very slim even with heroic measures.   Her daughter expressed understanding this, however she states she is not willing to change anything regarding plan of care for her mother without having further discussion with her siblings and her mother's primary care provider.  I encouraged her to continue this discussion with her mother's primary care provider and she reports that she will do so.  Length of Stay: 7 days  Current Medications: Scheduled Meds:  . antiseptic oral rinse  7 mL Mouth Rinse q12n4p  . atorvastatin  40 mg Oral q1800  . carvedilol  3.125 mg Oral BID WC  . chlorhexidine  15 mL Mouth Rinse BID  . feeding supplement (PRO-STAT SUGAR FREE 64)  30 mL Oral BID  . ferrous sulfate  325 mg Oral Daily  . furosemide  40 mg Intravenous Q6H  . hydrocortisone-pramoxine  1 applicator Rectal BID  . lactulose  10 g Oral BID  . lubiprostone  24 mcg Oral BID WC  . multivitamin with minerals  1 tablet Oral Daily  . pantoprazole (PROTONIX) IV  40 mg Intravenous Q12H  . potassium chloride  40 mEq Oral Once  . predniSONE  10 mg Oral Q breakfast  . senna-docusate  1 tablet Oral Daily    Continuous Infusions:    PRN Meds: acetaminophen **OR** acetaminophen, albuterol, bisacodyl, dextrose, magnesium hydroxide, ondansetron, polyethylene glycol, simethicone  Palliative Performance Scale: 30%     Vital Signs: BP 120/62 mmHg  Pulse 66  Temp(Src) 98.7 F (37.1 C) (Oral)  Resp 20  Ht $R'5\' 2"'Lt$  (1.575 m)  Wt 106.9 kg (235 lb 10.8 oz)  BMI 43.09 kg/m2  SpO2 94% SpO2: SpO2: 94 % O2 Device: O2 Device: Not Delivered O2 Flow Rate: O2 Flow Rate (L/min): 2 L/min  Intake/output summary:  Intake/Output Summary  (Last 24 hours) at 11/08/14 1640 Last data filed at 11/08/14 1050  Gross per 24 hour  Intake 1053.33 ml  Output    775 ml  Net 278.33 ml   LBM:   Baseline Weight: Weight: 106.2 kg (234 lb 2.1 oz) Most recent weight: Weight: 106.9 kg (235 lb 10.8 oz)  Physical Exam:  General: Elderly obese female, poor hearing, responsive to loud verbal or tactile stimuli   HEENT: moist oral mucosa, supple neck  Chest: Diminished breath sounds bilaterally  CVS: Normal S1 and S2, no murmurs   GI Soft, nondistended, nontender, bowel sounds present  Musculoskeletal: Warm, +1 edema, leg ulceration noted  CNS: Alert and awake, appears imminently confused, very hard of hearing, chronic left-sided weakness             Additional Data Reviewed: Recent Labs     11/07/14  0456  11/08/14  0645  WBC  9.6   --   HGB  9.2*   --   PLT  285   --   NA  129*  135  BUN  31*  25*  CREATININE  0.77  0.87     Problem List:  Patient Active Problem List   Diagnosis Date Noted  . Flank pain 11/01/2014  . UTI (lower urinary tract infection) 11/01/2014  . Pyelonephritis 11/01/2014  . CN (constipation)   . Atherosclerotic PVD with ulceration 09/30/2014  . Pressure ulcer 09/20/2014  . Hyperkalemia 09/19/2014  . Atherosclerosis of artery of extremity with ulceration 09/13/2014  . Hyperlipidemia   . DM type 2 (diabetes mellitus, type 2) 07/25/2014  . Thrush 07/04/2014  . Left tibial fracture 04/26/2014  . Vitamin D deficiency 03/26/2014  . Left fibular fracture 03/26/2014  . CKD (chronic kidney disease), stage III 10/18/2013  . Chronic respiratory failure 08/28/2013  . Chronic diastolic heart failure 03/47/4259  . Immobility 08/15/2013  . Acute respiratory failure 08/13/2013  . GERD (gastroesophageal reflux disease) 08/13/2013  . CHF (congestive heart failure) 08/04/2013  . Acute combined systolic and diastolic congestive heart failure, NYHA class 4 08/04/2013  . GI bleed 07/13/2013  .  History of lower GI bleeding 07/11/2013  . AKI (acute kidney injury) 06/29/2013  . Acute lower GI bleeding 06/23/2013  . PNA (pneumonia) 06/19/2013  . Rash and nonspecific skin eruption 06/19/2013  . Chronic respiratory failure with hypoxia 06/18/2013  . Acute encephalopathy 06/07/2013  . Diastolic CHF 56/38/7564  . Pulmonary hypertension 06/05/2013  . Weakness 06/03/2013  . Encounter for therapeutic drug monitoring 04/10/2013  . Long term current use of anticoagulant 10/26/2011  . Hypokalemia 10/15/2011  . Abdominal pain 10/15/2011  . Iron deficiency anemia 10/15/2011  . Bladder prolapse, female, acquired 10/15/2011  . Atrial fibrillation 10/15/2011  . Acute on chronic combined systolic and diastolic congestive heart failure 07/22/2011  . Facial edema 12/30/2010  . Leukopenia 12/30/2010  . Neutropenia 12/30/2010  . Chronic pain 12/25/2010  . Hematochezia 12/25/2010  . Dehydration 12/25/2010  . Thrombocytosis 12/25/2010  . Hyponatremia 12/25/2010  . Chronic combined systolic and diastolic congestive heart failure, NYHA  class 4   . Female bladder prolapse   . Mitral regurgitation 06/04/2010  . HYPOPOTASSEMIA 09/24/2009  . Chronic combined systolic and diastolic congestive heart failure 09/09/2009  . CARDIOMYOPATHY 03/04/2009  . ANASARCA 03/04/2009  . Hypertensive heart disease with CHF 02/20/2009  . CONSTIPATION, CHRONIC 02/20/2009  . DIVERTICULOSIS 02/20/2009     Palliative Care Assessment & Plan    Code Status:  Full code  Goals of Care: No change in CODE STATUS at this time. Her daughter and I did have another long discussion regarding her long-term goals as well as setting limits on care she receives when she is at the hospital if that care is not likely to allow her to get well enough to leave the hospital. She seemed to be understanding this and is agreed with conversation, however she reports she will not make any changes that discussing further with her siblings  and her mother's primary care provider. I advised her that she should follow up with her primary care provider in order to continue conversation. We also discussed her mother's risk for recurrent aspiration and how this will play into her clinical course moving forward.  She reports understanding and that if her mother declines after returning home, she will call enroll her in hospice support. She worked at [employer] for a time and reports being very familiar with hospice services.  Psycho-social/Spiritual:  Desire for further Chaplaincy support:no   Prognosis: < 6 months Discharge Planning: Home with [facility] was discussed with a shin, her daughter, and Dr. Waldron Labs  Thank you for allowing the Palliative Medicine Team to assist in the care of this patient.   Time In: 1035 Time Out: 1115 Total Time 40 Prolonged Time Billed no    Greater than 50%  of this time was spent counseling and coordinating care related to the above assessment and plan.   Micheline Rough, MD  11/08/2014, 4:40 PM  Please contact Palliative Medicine Team phone at [phone] for questions and concerns.

## 2014-11-08 NOTE — Progress Notes (Signed)
Speech Language Pathology Treatment:    Patient Details Name: [name] MRN: [id number] DOB: 19-Dec-1934 Today's Date: 11/08/2014 Time: 0830-0902 SLP Time Calculation (min) (ACUTE ONLY): 32 min  Assessment / Plan / Recommendation Clinical Impression  Skilled observation of various consistencies including puree and thin via tsp/cup with multiple swallows noted with larger cup sips, but eliminated with tsp amounts of thin; oral holding noted with thin/puree intermittently; Initial PO intake indicated vocal quality clear, but audible wheezing noted after multiple bites/sips of thin/puree suggesting possible aspiration or penetration of thin and/or puree; MBS recommended d/t hx of PNA and to r/o aspiration and determine safest diet for pt. Daughter stated she will be "taking her home from nursing home" soon and "wants her to be safe when eating."     HPI Other Pertinent Information: 80 year old female with history of PVD status post recent  PTA L SFA and peroneal artery by Dr. Bridgett Larsson 09/25/14), hypertension, diet-controlled diabetes mellitus, severe hearing loss, GERD, diastolic congestive heart failure, bladder prolapse, mitral valve regurgitation, atrial fibrillation (not on anticoagulant due to GI bleeding), iron deficiency anemia, GI bleeding, chronic respiratory failure on 2 L oxygen at home, CKD-II, recurrent UTI, morbid obesity, who is mostly bedbound since December 2015 (after she had left ankle fracture and possibly peripheral vascular disease).  Patient was brought to the ED by her daughter with right flank pain, dysuria, increased urinary frequency, altered mental status and some chest discomfort.  UA suggestive of UTI, and possible focal infiltration in the left mid lung on chest x-ray.  Patient has been consuming a Dys.1 texture diet with thin liquids but diet was discontinued and orders were received for bedside swallow evaluation.     Pertinent Vitals Pain Assessment: Faces Pain Score: 3   Faces Pain Scale: Hurts little more Pain Location:  (Needs to have a bowel movement) Pain Descriptors / Indicators: Discomfort  SLP Plan  MBS    Recommendations Diet recommendations: Other(comment) (pending MBS) Medication Administration: Crushed with puree              Oral Care Recommendations: Oral care BID Follow up Recommendations: Skilled Nursing facility Plan: MBS         ADAMS,PAT, M.S., CCC-SLP 11/08/2014, 9:26 AM

## 2014-11-08 NOTE — Progress Notes (Signed)
Verified with [name] at [facility] that all DME will be delivered to home at 5:30. [name] stated that he spoke with daugter [name] and verified delivery time.

## 2014-11-08 NOTE — Progress Notes (Addendum)
Patient discharge teaching given, including activity, diet, follow-up appoints, and medications. Family members of patient verbalized understanding of all discharge instructions. IV access was d/c'd. Vitals are stable. Skin is intact except as charted in most recent assessments. Awaiting transportation at this time. 75 F foley cath in place. Present upon arrival to [facility] d/t urinary retention. To be d/c with foley cath in. Teaching provided to family regarding care.

## 2014-11-08 NOTE — Discharge Summary (Signed)
[name], is a 80 y.o. female  DOB Nov 22, 1934  MRN [id number].  Admission date:  11/01/2014  Admitting Physician  Samantha Costa, MD  Discharge Date:  11/08/2014   Primary MD  Samantha Casco, MD  Recommendations for primary care physician for things to follow:  - Check labs including CBC, BMP and urinalysis visit - Patient diuresis has been increased, currently she is on Zaroxolyn 2.5 twice weekly, she is on Lasix 60 mg oral twice a day, this will need to be monitored and adjusted further as an outpatient depending on her following status.   Admission Diagnosis  Dehydration [E86.0] Hyponatremia [E87.1] UTI (lower urinary tract infection) [N39.0] HCAP (healthcare-associated pneumonia) [J18.9] Constipation, unspecified constipation type [K59.00]   Discharge Diagnosis  Dehydration [E86.0] Hyponatremia [E87.1] UTI (lower urinary tract infection) [N39.0] HCAP (healthcare-associated pneumonia) [J18.9] Constipation, unspecified constipation type [K59.00]    Principal Problem:   Pyelonephritis Active Problems:   CONSTIPATION, CHRONIC   Mitral regurgitation   Female bladder prolapse   Hyponatremia   Iron deficiency anemia   Atrial fibrillation   Diastolic CHF   Acute encephalopathy   Chronic respiratory failure with hypoxia   History of lower GI bleeding   GERD (gastroesophageal reflux disease)   CKD (chronic kidney disease), stage III   DM type 2 (diabetes mellitus, type 2)   Hyperlipidemia   Atherosclerotic PVD with ulceration   Flank pain   UTI (lower urinary tract infection)      Past Medical History  Diagnosis Date  . HTN (hypertension)   . Diverticulosis   . Hemorrhoid   . Constipation, chronic   . Systolic and diastolic CHF, chronic     a. Echo 07/2011: Technically limited; inferior HK, inferolateral and possibly lateral wall HK, EF 40%, moderate LVE, aortic sclerosis  without stenosis, mild MR, mild LAE, question RV dysfunction;   b.  Echo (04/2013): EF 50-55%, normal wall motion, moderate MR, mild to moderate LAE, PASP 37  . Female bladder prolapse     with prolapse uterus, cystocele, s/p TVH/BSO 12/2006  . Obesity (BMI 30-39.9)     wt. 94.1 kg 12/2010  . Moderate mitral regurgitation     a. Mod to Sev by echo 01/2009;  b. mild MR by echo 2013  . Atrial fibrillation 10/13/2011    coumadin Rx  . Iron deficiency anemia   . Shortness of breath dyspnea   . GERD (gastroesophageal reflux disease)   . Hyperlipidemia   . Fracture, ankle Dec. 26, 2015    Left  . Fall at home March 2016  . Dysrhythmia   . History of hiatal hernia   . Pneumonia "so many times"  . Recurrent UTI (urinary tract infection)     "she had 3 last month" (11/01/2014)  . DM type 2 (diabetes mellitus, type 2) dx'd 07/25/2014  . History of blood transfusion 2015    "related to LGIB"  . Lower GI bleeding 2015  . Osteoarthritis     "everywhere" (11/01/2014)  . Kidney stones     "  has been passing them for the last 5 days" (11/01/2014)  . Chronic kidney disease (CKD), stage II (mild)     [name] 11/01/2014    Past Surgical History  Procedure Laterality Date  . Wrist fracture surgery Left 2000's  . Colonoscopy N/A 07/18/2013    Procedure: COLONOSCOPY;  Surgeon: Samantha Horner, MD;  Location: [facility] ENDOSCOPY;  Service: Endoscopy;  Laterality: N/A;  . Flexible sigmoidoscopy N/A 03/08/2014    Procedure: FLEXIBLE SIGMOIDOSCOPY;  Surgeon: Samantha Ng, MD;  Location: [facility] END[facility]COPY;  Service: Endoscopy;  Laterality: N/A;  need hoyer lift  . Aortogram Left 09/25/2014    Procedure: AORTOGRAM WITH LEFT LEG LOWER EXTREMIY RUNOFF; ANGIOPLASTY OF TIBIAL AND PERONEAL ARTERY;  Surgeon: Samantha Ellsworth, MD;  Location: [place];  Service: Vascular;  Laterality: Left;  . Vaginal hysterectomy    . Vaginal hysterectomy,cystocele and prolapse  repair    . Fracture surgery         History of present illness  and  Hospital Course:     Kindly see H&P for history of present illness and admission details, please review complete Labs, Consult reports and Test reports for all details in brief  HPI  from the history and physical done on the day of admission  [name] is a 80 y.o. female with PMH of PVD (s/p of PTA L SFA and peroneal artery by [name] 09/25/14), hypertension, diet-controlled diabetes mellitus, GERD, diastolic congestive heart failure, bladder prolapse, mitral valve regurgitation, atrial fibrillation (not on anticoagulant due to GI bleeding), iron deficiency anemia, GI bleeding, chronic respiratory failure on 2 L oxygen at home, CKD-II, recurrent UTI, morbid obesity, who presents with right flank pain, dysuria, increased urinary frequency and altered mental status.  Patient has AMS and is unable to provide accurate medical history, therefore, most of the history is obtained by discussing the case with ED physician, per her daughter. It seems that patient has been having left flank pain in the past 5 days. Patient has increased urinary frequency and and dysuria per her daughter. Patient is confused, cannot see her daughter's name right. Patient is constipated, but no abdominal pain, diarrhea, fever or chills. Patient has mild chest pain, no cough. She has chronic shortness of breath and is on 2 L oxygen SNF, which has not changed. Patient had surgery to left leg due to PVD on 09/25/14. She has a wound over left lower leg which is in healing process. She does not move her left leg much, which is normal per her daughter. Per daughter, patient does not have hematochezia or hematuria. No dark or tarry stools recently.  In ED, patient was found to have positive urinalysis, negative troponin, hemoglobin dropped from 12.9 on 09/25/14-->9.7. Sodium 126, stable renal function, temperature normal, no tachycardia. X-ray of acute abdomen/chest showed possible focal infiltration in the left mid lung.  Hospital  Course    Acute pyelonephritis - Left nonobstructive nephrolithiasis on CT scan from 9/20. No hydronephrosis noted. Urine culture with insignificant growth. - Treated empirically on IV aztreonam total of 7 days  Acute encephalopathy Secondary to pyelonephritis and possible pneumonia.  Avoid narcotics or benzos as much as possible.  - Patient with worsening mental status 9/29, ABG with no CO2 retention, CT head with bilateral middle ear mastoid fluid not present previously, with no other acute findings. - Mentation back to baseline, patient is extremely hard of hearing - Patient had MBS study today, diet advanced to dysphagia 1 with nectar thick liquid,  to be seen by swallow service as an outpatient for further evaluation.  Possible healthcare associated pneumonia - Treated with vancomycin and aztreonam. Blood culture growing coag negative staph which appears to be a contaminant.   Hyponatremia secondary to hypervolemia .nephrology consult appreciated , improving with IV diuresis sodium is 135 on discharge.  Iron deficiency anemia with drop in H&H Hemoglobin dropped to 9 from baseline of 12.5. (Has been stable since admission) . Has history of GI bleed. Stool for occult blood negative..   Coagulase-negative +ve blood cx In one out of 2 sets, most likely contaminant.  Chest pain on admission Denies any symptoms at this time. EKG unremarkable. Troponin peaked at 0.05. Repeat echo with EF of 50-55% and no wall motion abnormality.  Peripheral vascular disease Status post PTA L SFA and peroneal artery by [name] 09/25/14. Continue Plavix and statin. Has a clean superficial ulcer over the left shin. Follows with wound care clinic in [place]. Appreciate wound care evaluation while inpatient.  Chronic Diastolic CHF - Patient was on Lasix 40 mg IV every 6 hours, on discharge and was started on Lasix 60 mg oral twice a day, and her metolazone increased from once weekly to twice  weekly.  A. fib  Not on anticoagulation due to GI bleed. Continue metoprolol.  Hypokalemia - Started on potassium supplement on discharge.   Chronic respiratory failure Continue O2 via nasal cannula (on 2 L O2 at home)  Morbid obesity  Chronic kidney disease stage II Renal function at baseline  Failure to thrive - Continue with supplements  Constipation with high stool burden Laxatives as needed  Chronic arthritis On chronic low-dose prednisone as outpatient., Continue Tylenol as needed     Discharge Condition:  Stable, but overall poor prognosis giving her severe debilitation and deconditioning, multiple comorbidities .   Follow UP  Follow-up Information    Follow up with [facility].   Why:  will deliver equipment to house   Contact information:   [address] [place] [address] [phone]       Follow up with [facility].   Why:  RN HHA SW SLP PT OT. will call in next 24 to 48 hours after discharge to set up first home visit   Contact information:   [address] [place] [address] [phone]       Follow up with Samantha Casco, MD. Call in 1 week.   Specialty:  Family Medicine   Why:  Posthospitalization follow-up   Contact information:   [address] [place] [address] [phone]         Discharge Instructions  and  Discharge Medications         Discharge Instructions    Discharge instructions    Complete by:  As directed   Follow with Primary MD Samantha Casco, MD in 7 days  - Diet: Patient to continue with dysphagia 1 , with nectar thick liquid, to be seen by swallow service as an outpatient regarding further adjustment and advancement of her diet. Get CBC, CMP, 2 view Chest X ray checked  by Primary MD next visit.    Activity: Per PT/OT evaluation.   Disposition Home    Diet:  , with feeding assistance and aspiration  precautions.  For Heart failure patients - Check your Weight same time everyday, if you gain over 2 pounds, or you develop in leg swelling, experience more shortness of breath  or chest pain, call your Primary MD immediately. Follow Cardiac Low Salt Diet and 1.5 lit/day fluid restriction.   On your next visit with your primary care physician please Get Medicines reviewed and adjusted.   Please request your Prim.MD to go over all Hospital Tests and Procedure/Radiological results at the follow up, please get all Hospital records sent to your Prim MD by signing hospital release before you go home.   If you experience worsening of your admission symptoms, develop shortness of breath, life threatening emergency, suicidal or homicidal thoughts you must seek medical attention immediately by calling 911 or calling your MD immediately  if symptoms less severe.  You Must read complete instructions/literature along with all the possible adverse reactions/side effects for all the Medicines you take and that have been prescribed to you. Take any new Medicines after you have completely understood and accpet all the possible adverse reactions/side effects.   Do not drive, operating heavy machinery, perform activities at heights, swimming or participation in water activities or provide baby sitting services if your were admitted for syncope or siezures until you have seen by Primary MD or a Neurologist and advised to do so again.  Do not drive when taking Pain medications.    Do not take more than prescribed Pain, Sleep and Anxiety Medications  Special Instructions: If you have smoked or chewed Tobacco  in the last 2 yrs please stop smoking, stop any regular Alcohol  and or any Recreational drug use.  Wear Seat belts while driving.   Please note  You were cared for by a hospitalist during your hospital stay. If you have any questions about your discharge medications or the care you received while you  were in the hospital after you are discharged, you can call the unit and asked to speak with the hospitalist on call if the hospitalist that took care of you is not available. Once you are discharged, your primary care physician will handle any further medical issues. Please note that NO REFILLS for any discharge medications will be authorized once you are discharged, as it is imperative that you return to your primary care physician (or establish a relationship with a primary care physician if you do not have one) for your aftercare needs so that they can reassess your need for medications and monitor your lab values.            Medication List    TAKE these medications        albuterol (2.5 MG/3ML) 0.083% nebulizer solution  Commonly known as:  PROVENTIL  Take 3 mLs (2.5 mg total) by nebulization every 4 (four) hours as needed for wheezing or shortness of breath (For wheezing and shortness of breath).     atorvastatin 40 MG tablet  Commonly known as:  LIPITOR  Take 1 tablet (40 mg total) by mouth daily.     bisacodyl 10 MG suppository  Commonly known as:  DULCOLAX  Place 1 suppository (10 mg total) rectally daily as needed for moderate constipation (if constipation not relieved by MOM).     carvedilol 3.125 MG tablet  Commonly known as:  COREG  Take 1 tablet (3.125 mg total) by mouth 2 (two) times daily with a meal. Hold if SBP <100     clopidogrel 75 MG tablet  Commonly known as:  PLAVIX  Take 1 tablet (75 mg total) by mouth daily.     feeding supplement (PRO-STAT SUGAR FREE 64) Liqd  Take 30 mLs by mouth  2 (two) times daily.     ferrous sulfate 325 (65 FE) MG tablet  Take 1 tablet (325 mg total) by mouth daily.     furosemide 40 MG tablet  Commonly known as:  LASIX  Take 1.5 tablets (60 mg total) by mouth 2 (two) times daily.     HYDROcodone-acetaminophen 5-325 MG tablet  Commonly known as:  NORCO/VICODIN  Take 1 tablet by mouth 3 (three) times daily as needed (pain).      lactulose 10 GM/15ML solution  Commonly known as:  CHRONULAC  Take 10 g by mouth 2 (two) times daily.     levalbuterol 0.63 MG/3ML nebulizer solution  Commonly known as:  XOPENEX  Take 0.63 mg by nebulization every 4 (four) hours as needed for wheezing or shortness of breath.     lubiprostone 24 MCG capsule  Commonly known as:  AMITIZA  Take 1 capsule (24 mcg total) by mouth 2 (two) times daily with a meal.     magnesium hydroxide 400 MG/5ML suspension  Commonly known as:  MILK OF MAGNESIA  Take 30 mLs by mouth daily as needed for mild constipation.     metolazone 2.5 MG tablet  Commonly known as:  ZAROXOLYN  Please use 1 tablet every Wednesday, and 1 tablet every Sunday.     ondansetron 8 MG tablet  Commonly known as:  ZOFRAN  Take 1 tablet (8 mg total) by mouth every 8 (eight) hours as needed for nausea or vomiting.     pantoprazole 40 MG tablet  Commonly known as:  PROTONIX  Take 1 tablet (40 mg total) by mouth 2 (two) times daily.     polyethylene glycol packet  Commonly known as:  MIRALAX / GLYCOLAX  Take 17 g by mouth daily.     potassium chloride SA 20 MEQ tablet  Commonly known as:  K-DUR,KLOR-CON  Take 2 tablets (40 mEq total) by mouth daily.     pramoxine 1 % foam  Commonly known as:  PROCTOFOAM  Place 1 application rectally 2 (two) times daily.     predniSONE 10 MG tablet  Commonly known as:  DELTASONE  Take 1 tablet (10 mg total) by mouth daily with breakfast.     RA SALINE ENEMA RE  Place 1 each rectally daily as needed (constipation).     sennosides-docusate sodium 8.6-50 MG tablet  Commonly known as:  SENOKOT-S  Take 1 tablet by mouth daily.     simethicone 80 MG chewable tablet  Commonly known as:  MYLICON  Chew 1 tablet (80 mg total) by mouth as needed for flatulence.     traMADol 50 MG tablet  Commonly known as:  ULTRAM  Take 1 tablet (50 mg total) by mouth every 8 (eight) hours.     UNABLE TO FIND  Take by mouth 2 (two) times daily.  Med Name: Magic Cup     UNABLE TO FIND  Take 120 mLs by mouth 2 (two) times daily. Med Name: Med Pass          Diet and Activity recommendation: See Discharge Instructions above   Consults obtained -  Palliative care Nephrology   Major procedures and Radiology Reports - PLEASE review detailed and final reports for all details, in brief -      Ct Head Wo Contrast  11/06/2014   CLINICAL DATA:  Lethargy today, more than usual per daughter.  EXAM: CT HEAD WITHOUT CONTRAST  TECHNIQUE: Contiguous axial images were obtained from the base of  the skull through the vertex without intravenous contrast.  COMPARISON:  06/20/2013.  FINDINGS: Generalized atrophy.  Chronic microvascular ischemic change.  No evidence for acute infarction, hemorrhage, mass lesion, hydrocephalus, or extra-axial fluid.  Calvarium. No sinus air-fluid level. Maxillary and sphenoid sinus retention cysts. Dense lenticular opacities. BILATERAL middle ear and mastoid fluid, not present previously.  IMPRESSION: Atrophy and small vessel disease. No acute intracranial findings are evident.  BILATERAL middle ear mastoid fluid not present previously. Correlate clinically for otitis or mastoiditis.   Electronically Signed   By: Staci Righter M.D.   On: 11/06/2014 09:27   Dg Abd Acute W/chest  11/01/2014   CLINICAL DATA:  Constant ongoing flank pain. Patient was evaluated on 09/18 for same and diagnosed with a kidney stone. Patient is attempting to pass the stone. Constipation.  EXAM: DG ABDOMEN ACUTE W/ 1V CHEST  COMPARISON:  Chest radiograph 07/30/2014. CT abdomen and pelvis 10/29/2014.  FINDINGS: Shallow inspiration. Cardiac enlargement. Prominent central pulmonary vascularity with suggestion of perihilar infiltration, likely edema. Focal area of more prominent infiltration in the left mid lung could represent superimposed pneumonia or atelectasis. No blunting of costophrenic angles. No pneumothorax.  Prominent stool-filled rectum.  Scattered gas and stool in the colon. No small or large bowel distention. No free intra-abdominal air. No abnormal air-fluid levels. No radiopaque stones. Vascular calcifications. Calcified phleboliths in the pelvis. Degenerative changes in the spine and hips.  IMPRESSION: Cardiac enlargement with probable perihilar edema and focal infiltration or atelectasis in the left mid lung.  Nonobstructive bowel gas pattern with prominent stool-filled rectum. No calcified radiopaque stones identified.   Electronically Signed   By: Lucienne Capers M.D.   On: 11/01/2014 04:43   Dg Abd Portable 1v  11/02/2014   CLINICAL DATA:  Left flank pain.  Kidney stone.  Increased lethargy.  EXAM: PORTABLE ABDOMEN - 1 VIEW  COMPARISON:  11/01/2014  FINDINGS: Gas is present throughout loops of nondilated bowel without evidence of obstruction. Limited evaluation for free air given supine technique. Tiny left renal stone on recent CT is not well seen on this radiograph. Vascular calcifications are noted.  IMPRESSION: Nonobstructed bowel-gas pattern.   Electronically Signed   By: Logan Bores M.D.   On: 11/02/2014 09:04   Dg Swallowing Func-speech Pathology  11/08/2014    Objective Swallowing Evaluation:    Patient Details  Name: [name] MRN: [id number] Date of Birth: Dec 06, 1934  Today's Date: 11/08/2014 Time: SLP Start Time (ACUTE ONLY): 1135-SLP Stop Time (ACUTE ONLY): 1148 SLP Time Calculation (min) (ACUTE ONLY): 13 min  Past Medical History:  Past Medical History  Diagnosis Date  . HTN (hypertension)   . Diverticulosis   . Hemorrhoid   . Constipation, chronic   . Systolic and diastolic CHF, chronic     a. Echo 07/2011: Technically limited; inferior HK, inferolateral and  possibly lateral wall HK, EF 40%, moderate LVE, aortic sclerosis without  stenosis, mild MR, mild LAE, question RV dysfunction;   b.  Echo (04/2013):  EF 50-55%, normal wall motion, moderate MR, mild to moderate LAE, PASP 37  . Female bladder prolapse     with  prolapse uterus, cystocele, s/p TVH/BSO 12/2006  . Obesity (BMI 30-39.9)     wt. 94.1 kg 12/2010  . Moderate mitral regurgitation     a. Mod to Sev by echo 01/2009;  b. mild MR by echo 2013  . Atrial fibrillation 10/13/2011    coumadin Rx  . Iron deficiency anemia   .  Shortness of breath dyspnea   . GERD (gastroesophageal reflux disease)   . Hyperlipidemia   . Fracture, ankle Dec. 26, 2015    Left  . Fall at home March 2016  . Dysrhythmia   . History of hiatal hernia   . Pneumonia "so many times"  . Recurrent UTI (urinary tract infection)     "she had 3 last month" (11/01/2014)  . DM type 2 (diabetes mellitus, type 2) dx'd 07/25/2014  . History of blood transfusion 2015    "related to LGIB"  . Lower GI bleeding 2015  . Osteoarthritis     "everywhere" (11/01/2014)  . Kidney stones     "has been passing them for the last 5 days" (11/01/2014)  . Chronic kidney disease (CKD), stage II (mild)     [name] 11/01/2014   Past Surgical History:  Past Surgical History  Procedure Laterality Date  . Wrist fracture surgery Left 2000's  . Colonoscopy N/A 07/18/2013    Procedure: COLONOSCOPY;  Surgeon: Samantha Horner, MD;  Location: [facility]  ENDOSCOPY;  Service: Endoscopy;  Laterality: N/A;  . Flexible sigmoidoscopy N/A 03/08/2014    Procedure: FLEXIBLE SIGMOIDOSCOPY;  Surgeon: Samantha Ng, MD;   Location: [facility] ENDOSCOPY;  Service: Endoscopy;  Laterality: N/A;  need hoyer  lift  . Aortogram Left 09/25/2014    Procedure: AORTOGRAM WITH LEFT LEG LOWER EXTREMIY RUNOFF; ANGIOPLASTY OF  TIBIAL AND PERONEAL ARTERY;  Surgeon: Samantha Maddock, MD;  Location: [facility];   Service: Vascular;  Laterality: Left;  . Vaginal hysterectomy    . Vaginal hysterectomy,cystocele and prolapse  repair    . Fracture surgery     HPI:  Other Pertinent Information: 80 year old female with history of PVD status  post recent  PTA L SFA and peroneal artery by [name] 09/25/14),  hypertension, diet-controlled diabetes mellitus, severe hearing loss,  GERD, diastolic congestive  heart failure, bladder prolapse, mitral valve  regurgitation, atrial fibrillation (not on anticoagulant due to GI  bleeding), iron deficiency anemia, GI bleeding, chronic respiratory  failure on 2 L oxygen at home, CKD-II, recurrent UTI, morbid obesity, who  is mostly bedbound since December 2015 (after she had left ankle fracture  and possibly peripheral vascular disease).  Patient was brought to the ED  by her daughter with right flank pain, dysuria, increased urinary  frequency, altered mental status and some chest discomfort.  UA suggestive  of UTI, and possible focal infiltration in the left mid lung on chest  x-ray.  Patient has been consuming a Dys.1 texture diet with thin liquids  but diet was discontinued and orders were received for bedside swallow  evaluation.  BSE indicated NPO d/t lethargy/decreased LOA initially, but  re-assessment with daughter present was more successful with pt consuming  puree/thin at bedside with s/s of aspiration noted, so MBS was warranted.  No Data Recorded  Assessment / Plan / Recommendation [facility] IP CLINICAL IMPRESSIONS 11/08/2014  Therapy Diagnosis Mild oral phase dysphagia;Mild pharyngeal phase  dysphagia;Moderate pharyngeal phase dysphagia  Clinical Impression Deep penetration/eventual aspiration with cup sips of  thin d/t decreased airway protection and decreased cognition to complete  compensatory tasks during MBS; nectar-thickened liquids resolved issue and  no penetration or aspiration noted with this consistency.  Orally, pt  exhibited intermittent holding in the oral cavity which puts her at risk  as well with thin liquids at this time; puree consistency held orally, but  no aspiration or penetration noted with swallow with probable cognitive  impairment impacting  swallow; no other consistencies assessed d/t pt's  cognitive status/daughter stated she was [facility] puree [facility] at [facility] IP TREATMENT RECOMMENDATION 11/08/2014  Treatment Recommendations Therapy as  outlined in treatment plan below     [facility] IP DIET RECOMMENDATION 11/08/2014  SLP Diet Recommendations Dysphagia 1 (Puree);Nectar  Liquid Administration via (None)  Medication Administration Crushed with puree  Compensations Slow rate;Small sips/bites;Other (Comment)  Postural Changes and/or Swallow Maneuvers (None)     [facility] IP OTHER RECOMMENDATIONS 11/08/2014  Recommended Consults (None)  Oral Care Recommendations Oral care BID  Other Recommendations (None)     [facility] IP FOLLOW UP RECOMMENDATIONS 11/08/2014  Follow up Recommendations Skilled Nursing facility     [facility] IP FREQUENCY AND DURATION 11/08/2014  Speech Therapy Frequency (ACUTE ONLY) min 2x/week  Treatment Duration 2 weeks     Pertinent Vitals/Pain WDL    SLP Swallow Goals [facility] IP SWALLOW STUDY GOALS 01/01/2011  Patient will consume recommended diet without observed clinical signs of  aspiration with Modified independent assistance  Swallow Study Goal #1 - Progress Progressing toward goal  Patient will utilize recommended strategies during swallow to increase  swallowing safety with Minimal assistance  Swallow Study Goal #2 - Progress Progressing toward goal  Goal #3 (None)  Swallow Study Goal #3 - Progress (None)  Goal #4 (None)  Swallow Study Goal #4 - Progress Revised (modified due to lack of  progress/goal met)      [facility] IP REASON FOR REFERRAL 11/08/2014  Reason for Referral Objectively evaluate swallowing function                    ADAMS,PAT, M.S, CCC-SLP 11/08/2014, 1:44 PM    Ct Renal Stone Study  10/29/2014   CLINICAL DATA:  Possible obstruction  EXAM: CT ABDOMEN AND PELVIS WITHOUT CONTRAST  TECHNIQUE: Multidetector CT imaging of the abdomen and pelvis was performed following the standard protocol without IV contrast.  COMPARISON:  10/04/2014  FINDINGS: Lung bases shows patchy bilateral basilar atelectasis. Cardiomegaly is noted. Sagittal images of the spine shows osteopenia and degenerative changes thoracolumbar spine. There is diffuse osteopenia.  Degenerative changes bilateral SI joints. Study is limited by extensive streak artifacts from patient's large body habitus. Unenhanced liver shows no biliary ductal dilatation. No calcified gallstones are noted within gallbladder.  Unenhanced kidneys are symmetrical in size. There is no hydronephrosis. Atherosclerotic calcifications are noted right renal hilum and right renal artery. Nonobstructive calculus in lower pole of the left kidney measures 3 mm. There is a cyst in upper pole of the left kidney measures 3.1 cm. Cyst in midpole lateral aspect of the left kidney measures 2.9 cm. Cyst in lower pole of the left kidney measures 2 cm.  No hydroureter.  No calcified ureteral calculi.  There is no small bowel obstruction. No ascites or free air. No adenopathy. Moderate stool noted throughout the colon. There is redundant transverse colon. Few diverticula are noted descending colon. Redundant sigmoid colon. Moderate distension of sigmoid colon with gas. Few diverticula are noted sigmoid colon. No evidence of acute diverticulitis. Moderate stool noted in distal sigmoid colon and rectum. Degenerative changes pubic symphysis.  The patient is status post hysterectomy. Mild atherosclerotic calcifications of abdominal aorta.  No pericecal inflammation.  Normal appendix.  IMPRESSION: 1. There is left nonobstructive nephrolithiasis. Bilateral renal cysts. No hydronephrosis or hydroureter. 2. Bilateral renal hilum atherosclerotic calcifications are noted. Mild atherosclerotic calcifications of abdominal aorta. 3. No small bowel obstruction. 4. Moderate stool noted  throughout the colon. Redundant transverse colon. Significant redundant sigmoid colon. Moderate stool noted distal sigmoid colon and rectum. 5. Status post hysterectomy.   Electronically Signed   By: Lahoma Crocker M.D.   On: 10/29/2014 15:38    Micro Results     Recent Results (from the past 240 hour(s))  Urine culture     Status: None   Collection Time:  11/01/14  4:17 AM  Result Value Ref Range Status   Specimen Description URINE, CATHETERIZED  Final   Special Requests ADDED [id number] [phone]  Final   Culture 1,000 COLONIES/mL INSIGNIFICANT GROWTH  Final   Report Status 11/02/2014 FINAL  Final  Blood culture (routine x 2)     Status: None   Collection Time: 11/01/14  5:35 AM  Result Value Ref Range Status   Specimen Description BLOOD RIGHT ANTECUBITAL  Final   Special Requests BOTTLES DRAWN AEROBIC AND ANAEROBIC 10CC  Final   Culture  Setup Time   Final    GRAM POSITIVE COCCI IN CLUSTERS IN BOTH AEROBIC AND ANAEROBIC BOTTLES CRITICAL RESULT CALLED TO, READ BACK BY AND VERIFIED WITH: EUNICE $[email or web address]  11/02/14 MKELLY GRAM POSITIVE RODS AEROBIC BOTTLE ONLY    Culture   Final    STAPHYLOCOCCUS SPECIES (COAGULASE NEGATIVE) THE SIGNIFICANCE OF ISOLATING THIS ORGANISM FROM A SINGLE SET OF BLOOD CULTURES WHEN MULTIPLE SETS ARE DRAWN IS UNCERTAIN. PLEASE NOTIFY THE MICROBIOLOGY DEPARTMENT WITHIN ONE WEEK IF SPECIATION AND SENSITIVITIES ARE REQUIRED. DIPHTHEROIDS(CORYNEBACTERIUM SPECIES) Standardized susceptibility testing for this organism is not available.    Report Status 11/04/2014 FINAL  Final  Blood culture (routine x 2)     Status: None   Collection Time: 11/01/14  5:40 AM  Result Value Ref Range Status   Specimen Description BLOOD HAND RIGHT  Final   Special Requests BOTTLES DRAWN AEROBIC AND ANAEROBIC 5CC  Final   Culture NO GROWTH 5 DAYS  Final   Report Status 11/06/2014 FINAL  Final  MRSA PCR Screening     Status: None   Collection Time: 11/01/14  9:36 AM  Result Value Ref Range Status   MRSA by PCR NEGATIVE NEGATIVE Final    Comment:        The GeneXpert MRSA Assay (FDA approved for NASAL specimens only), is one component of a comprehensive MRSA colonization surveillance program. It is not intended to diagnose MRSA infection nor to guide or monitor treatment for MRSA infections.        Today   Subjective:   [name] today has no headache,no chest abdominal pain, complains of hurting all over.  Objective:   Blood pressure 120/62, pulse 77, temperature 98.7 F (37.1 C), temperature source Oral, resp. rate 20, height $RemoveBe'5\' 2"'[id number]$  (1.575 m), weight 106.9 kg (235 lb 10.8 oz), SpO2 100 %.   Intake/Output Summary (Last 24 hours) at 11/08/14 1459 Last data filed at 11/08/14 1050  Gross per 24 hour  Intake 1053.33 ml  Output    775 ml  Net 278.33 ml    Exam  General: Elderly obese female, extremely hard of hearing, more communicative today.  HEENT: moist oral mucosa, supple neck  Chest: Diminished breath sounds bilaterally due to body habitus  CVS: Normal S1 and S2, no murmurs   GI Soft, nondistended, nontender, bowel sounds present  Musculoskeletal: Warm, +1 edema, ulcer left tibia (chronic)  CNS: Alert and awake, oriented at baseline , very hard of hearing, chronic left-sided weakness Data Review   CBC w Diff:  Lab Results  Component  Value Date   WBC 9.6 11/07/2014   HGB 9.2* 11/07/2014   HCT 29.0* 11/07/2014   PLT 285 11/07/2014   LYMPHOPCT 13 11/01/2014   BANDSPCT 1 01/03/2011   MONOPCT 5 11/01/2014   EOSPCT 1 11/01/2014   BASOPCT 0 11/01/2014    CMP:  Lab Results  Component Value Date   NA 135 11/08/2014   K 3.7 11/08/2014   CL 102 11/08/2014   CO2 22 11/08/2014   BUN 25* 11/08/2014   CREATININE 0.87 11/08/2014   CREATININE 1.62* 02/22/2014   PROT 5.5* 11/01/2014   ALBUMIN 2.2* 11/07/2014   BILITOT 0.5 11/01/2014   ALKPHOS 123 11/01/2014   AST 17 11/01/2014   ALT 19 11/01/2014  .   Total Time in preparing paper work, data evaluation and todays exam - 35 minutes  ELGERGAWY, DAWOOD M.D on 11/08/2014 at 2:59 PM  [facility]   Office  [phone]

## 2014-11-08 NOTE — Progress Notes (Signed)
Patient will d/c home today via EMS.  CSW completed EMS forms and arranged for pick up around 5:30 pm.  CSW spoke with patient's granddaughter Helyn App (who was on the phone with patient's daughter [name]). Confirmed that [name] will be at the house waiting for patient.  Notified family that EMS pick up time was unclear as they are sometimes backed up on the weekend. CSW notified Ginger- Nursing Supervisor of above. CSW signing off.  Lorie Phenix. Pauline Good, [place]

## 2014-12-10 DEATH — deceased

## 2015-01-17 ENCOUNTER — Encounter: Payer: Self-pay | Admitting: Vascular Surgery

## 2015-01-24 ENCOUNTER — Ambulatory Visit: Payer: Medicare Other | Admitting: Vascular Surgery

## 2015-01-24 ENCOUNTER — Encounter (HOSPITAL_COMMUNITY): Payer: Medicare Other

## 2016-06-17 IMAGING — CR DG ABD PORTABLE 1V
1 series · 1 of 1 positions shown · non-contrast
Comparison: 11/01/2014

CLINICAL DATA: Left flank pain.  Kidney stone.  Increased lethargy.

EXAM:
PORTABLE ABDOMEN - 1 VIEW

[AP]
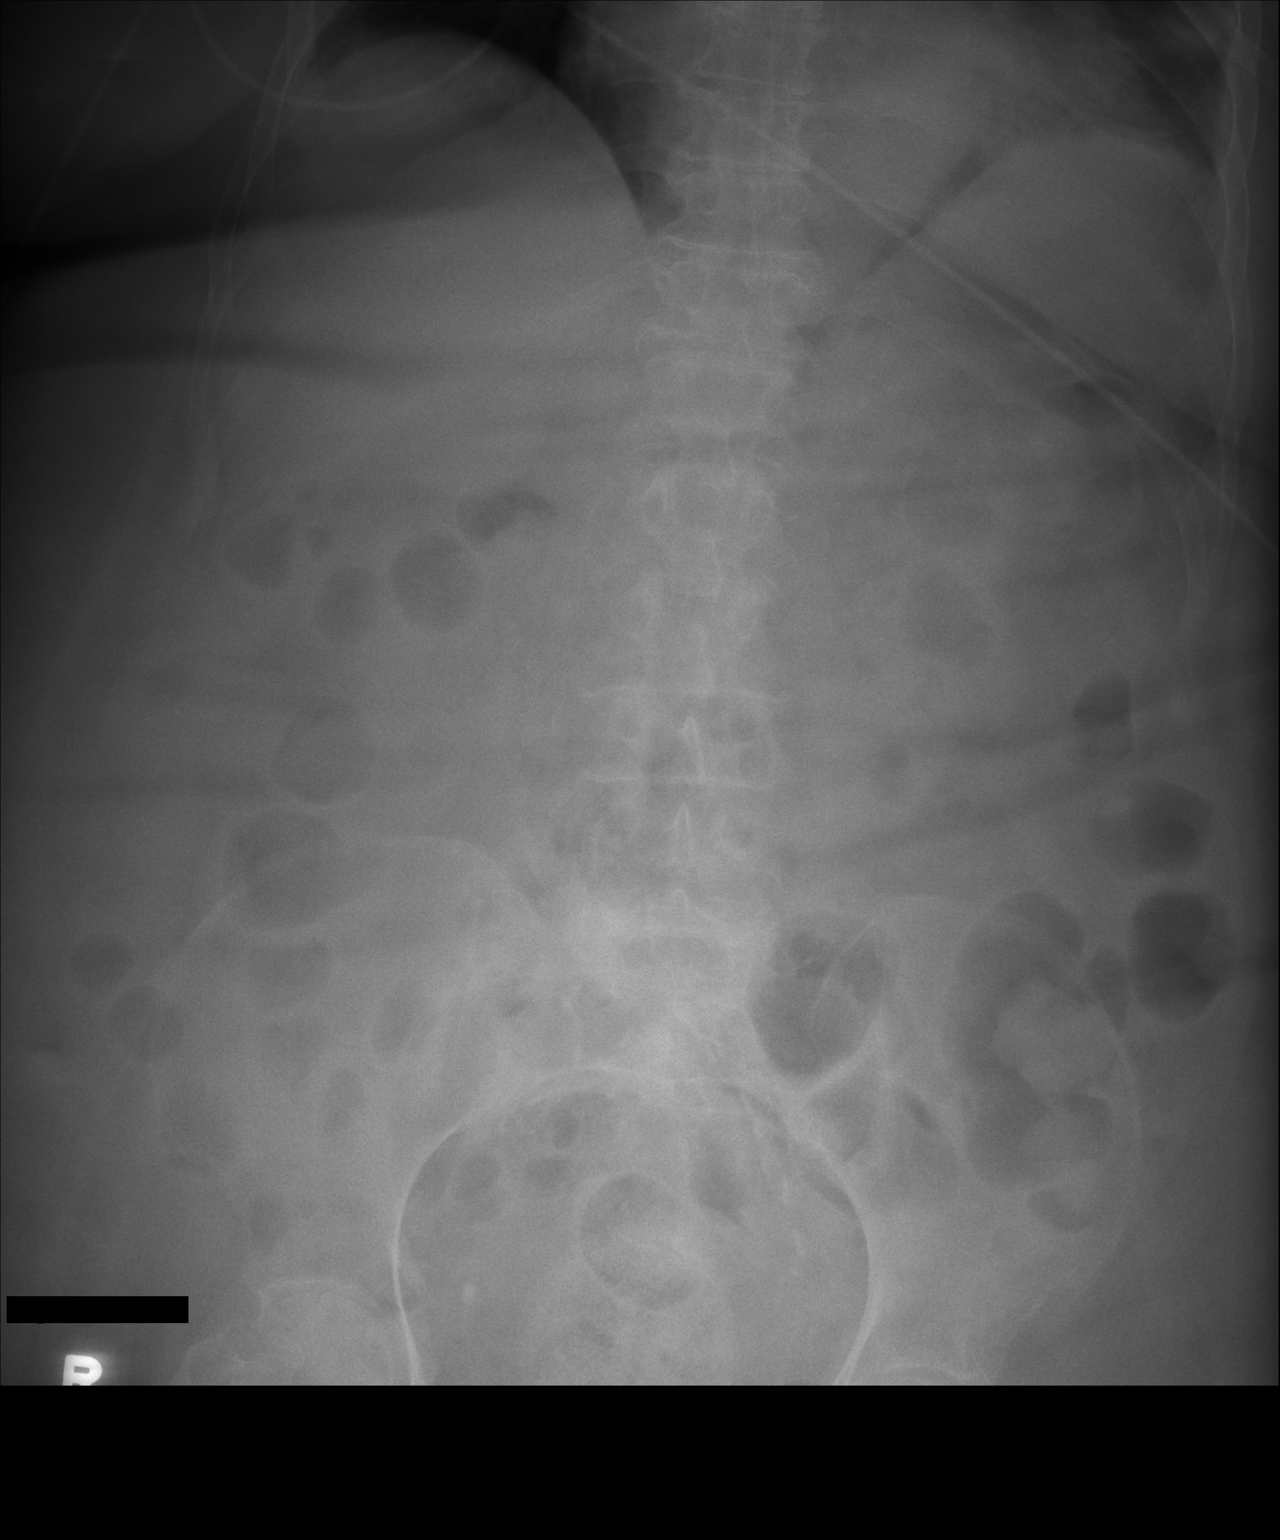

[1 of 1 positions shown; findings below may reference images not displayed]

FINDINGS: Gas is present throughout loops of nondilated bowel without evidence
of obstruction. Limited evaluation for free air given supine
technique. Tiny left renal stone on recent CT is not well seen on
this radiograph. Vascular calcifications are noted.
IMPRESSION: Nonobstructed bowel-gas pattern.

## 2016-06-21 IMAGING — CT CT HEAD W/O CM
2 series · 16 of 30 positions shown, 20 images · non-contrast
Comparison: 06/20/2013.

CLINICAL DATA: Lethargy today, more than usual per daughter.

EXAM:
CT HEAD WITHOUT CONTRAST
TECHNIQUE: Contiguous axial images were obtained from the base of the skull
through the vertex without intravenous contrast.

[Series 201: head w/o, idose (1) · axial · non-contrast · 0.49mm/px · z∈[+118,+248]mm · 13 of 32 slices shown, 17 images]
[im 3/32  brain]
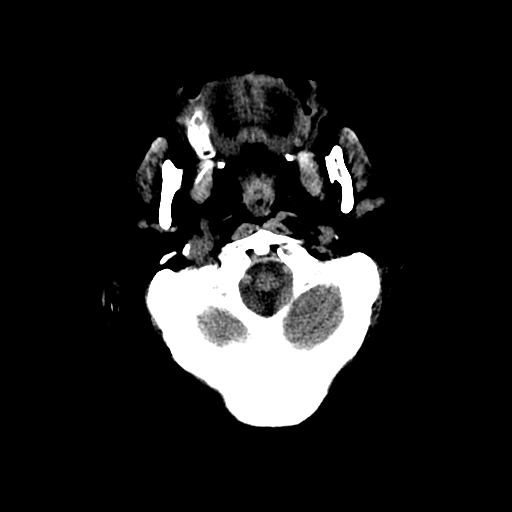
[im 3/32  bone]
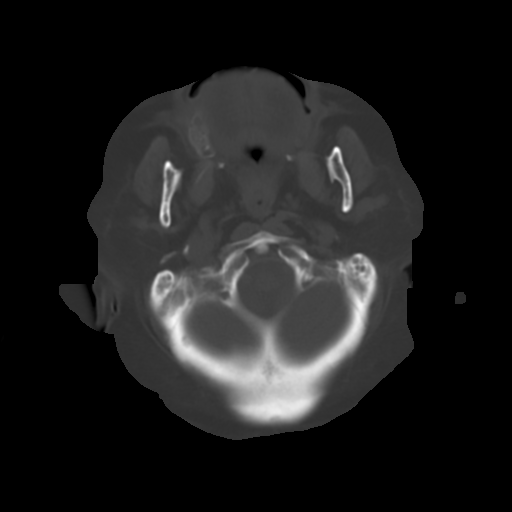
[im 5/32  brain]
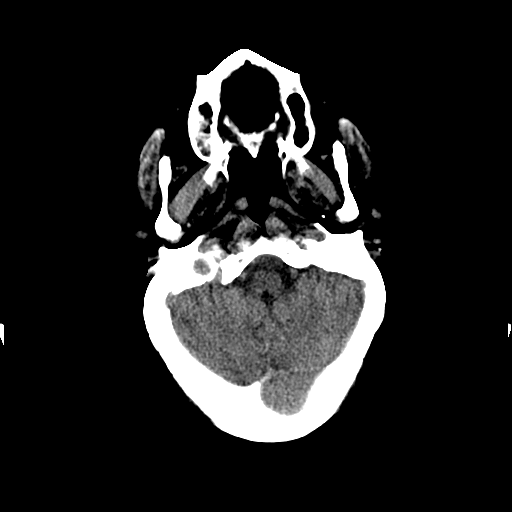
[im 7/32  brain]
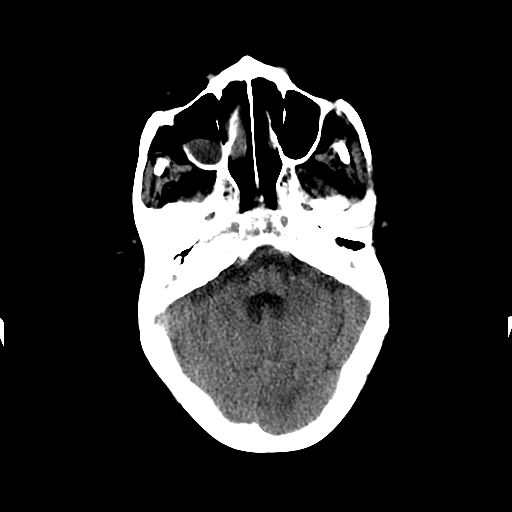
[im 9/32  brain]
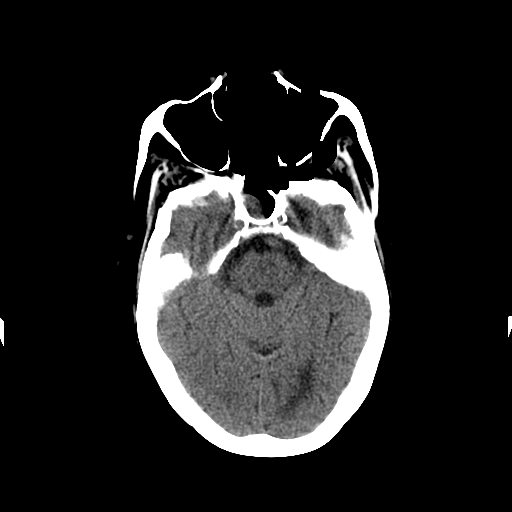
[im 12/32  brain]
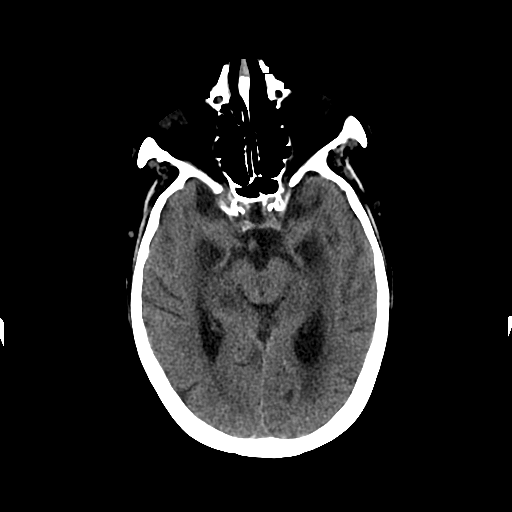
[im 12/32  bone]
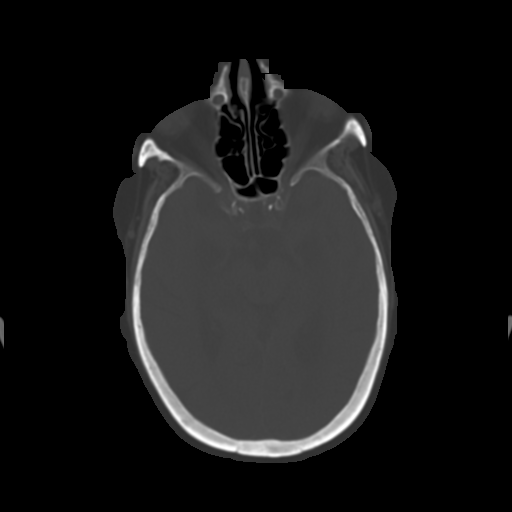
[im 14/32  brain]
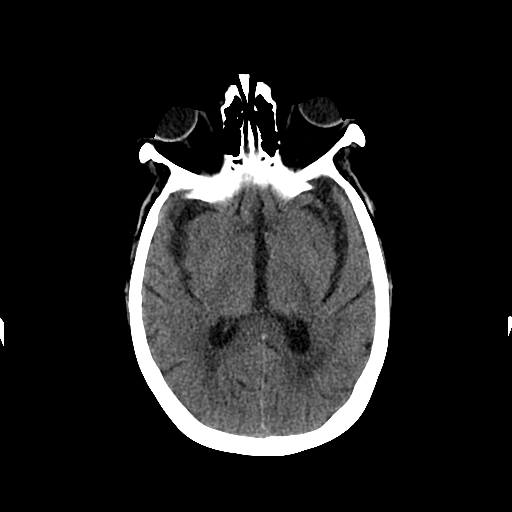
[im 16/32  brain]
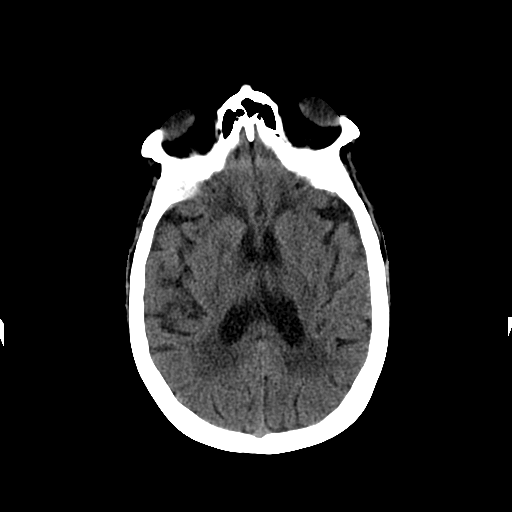
[im 18/32  brain]
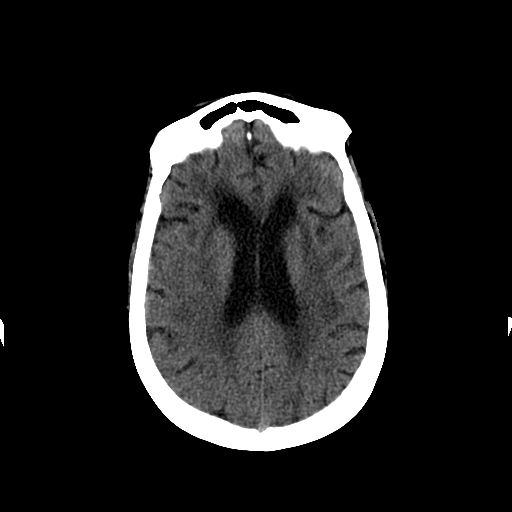
[im 20/32  brain]
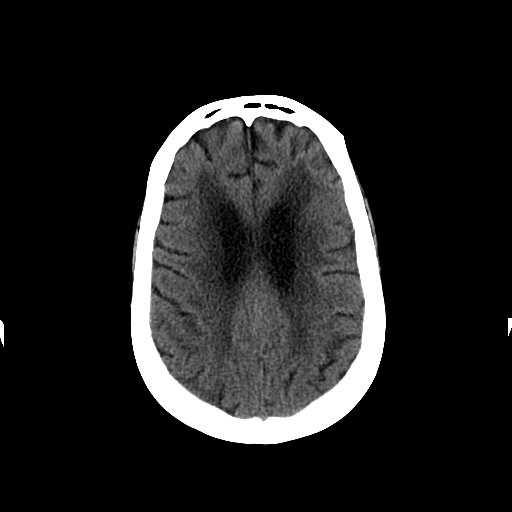
[im 20/32  bone]
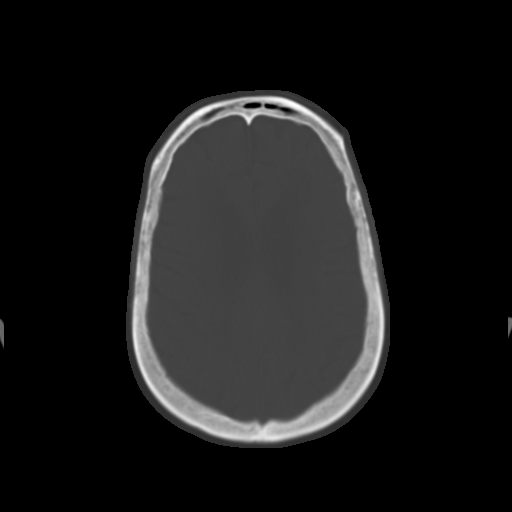
[im 23/32  brain]
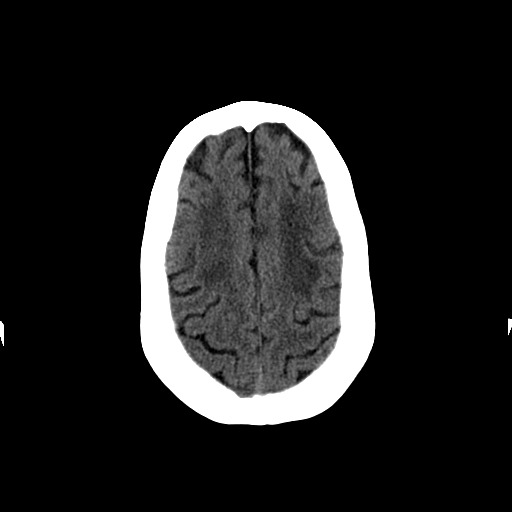
[im 25/32  brain]
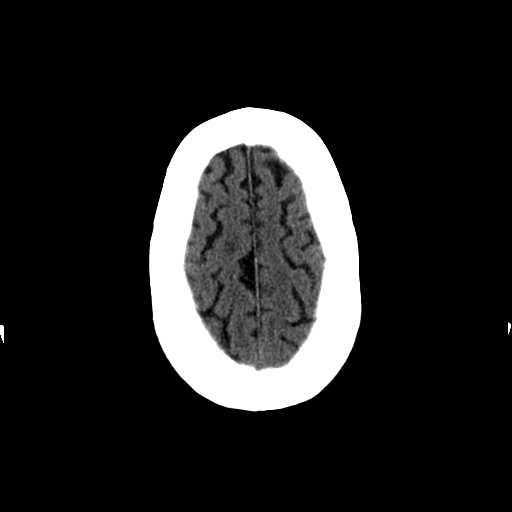
[im 27/32  brain]
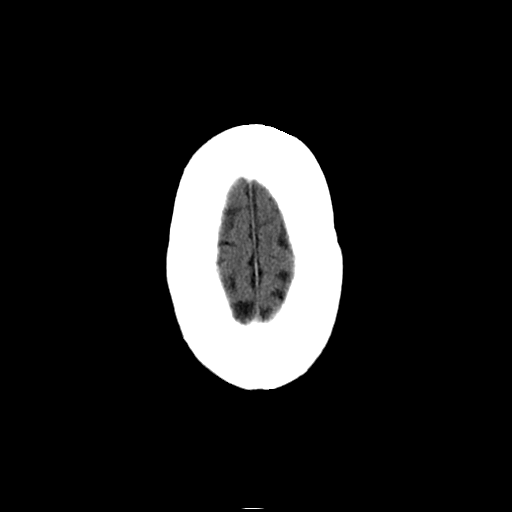
[im 29/32  brain]
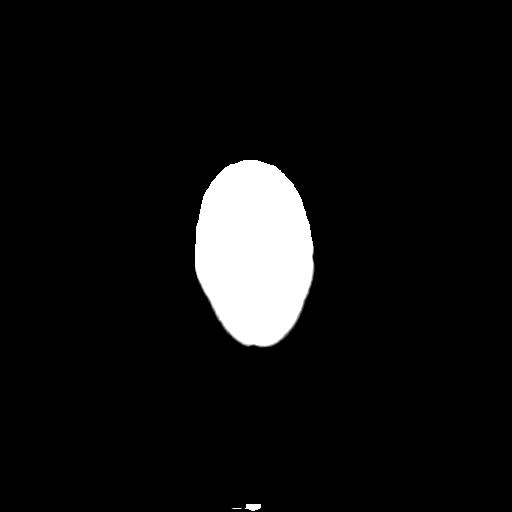
[im 29/32  bone]
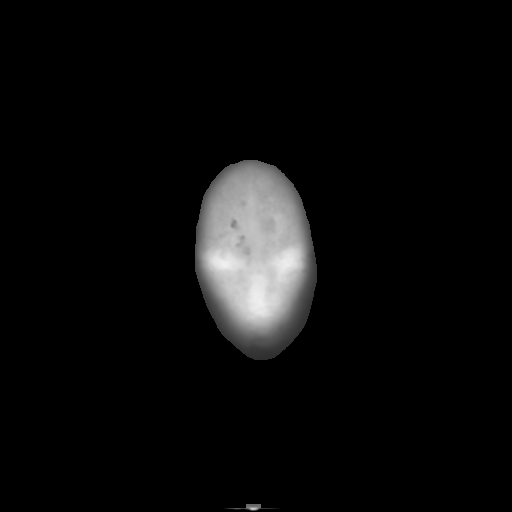

[Series 202: head w/o bone, idose (1) · axial · non-contrast · 0.49mm/px · z∈[+118,+163]mm · 3 of 32 slices shown]
[im 3/32  bone]
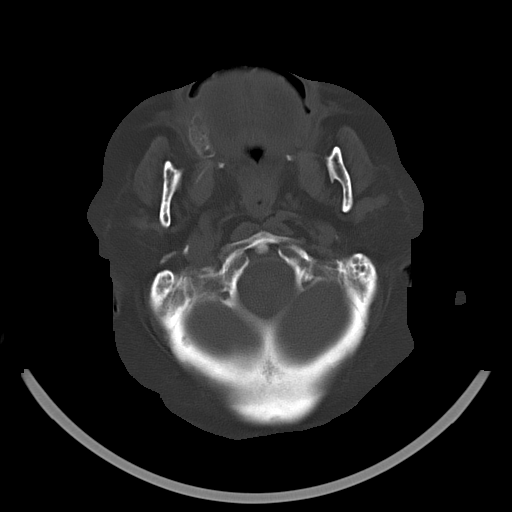
[im 7/32  bone]
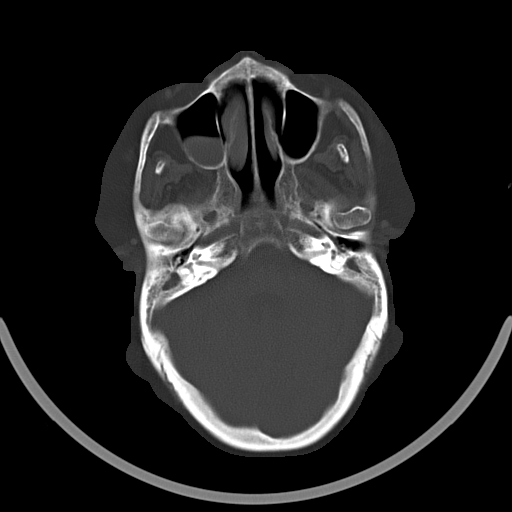
[im 12/32  bone]
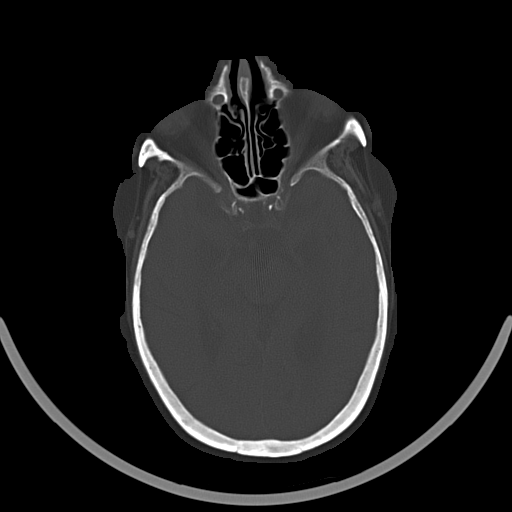

[16 of 30 positions shown; findings below may reference images not displayed]

FINDINGS: Generalized atrophy.  Chronic microvascular ischemic change.

No evidence for acute infarction, hemorrhage, mass lesion,
hydrocephalus, or extra-axial fluid.

Calvarium. No sinus air-fluid level. Maxillary and sphenoid sinus
retention cysts. Dense lenticular opacities. BILATERAL middle ear
and mastoid fluid, not present previously.
IMPRESSION: Atrophy and small vessel disease. No acute intracranial findings are
evident.

BILATERAL middle ear mastoid fluid not present previously. Correlate
clinically for otitis or mastoiditis.
